# Patient Record
Sex: Male | Born: 1955 | Race: Black or African American | Hispanic: No | Marital: Married | Smoking: Never smoker
Health system: Southern US, Community
[De-identification: ages and names within clinical notes are randomized; demographics above are authoritative.]

## PROBLEM LIST (undated history)

## (undated) DIAGNOSIS — E7800 Pure hypercholesterolemia, unspecified: Secondary | ICD-10-CM

## (undated) DIAGNOSIS — I10 Essential (primary) hypertension: Secondary | ICD-10-CM

## (undated) HISTORY — DX: Pure hypercholesterolemia, unspecified: E78.00

---

## 2002-06-23 HISTORY — PX: KNEE SURGERY: SHX244

## 2011-06-11 ENCOUNTER — Emergency Department (HOSPITAL_COMMUNITY): Payer: Worker's Compensation

## 2011-06-11 ENCOUNTER — Encounter (HOSPITAL_COMMUNITY)
Admission: EM | Disposition: A | Discharge status: Home Health | Payer: Self-pay | Source: Home / Self Care | Attending: Orthopedic Surgery

## 2011-06-11 ENCOUNTER — Emergency Department (HOSPITAL_COMMUNITY): Payer: Worker's Compensation | Admitting: Anesthesiology

## 2011-06-11 ENCOUNTER — Encounter (HOSPITAL_COMMUNITY): Payer: Self-pay | Admitting: Anesthesiology

## 2011-06-11 ENCOUNTER — Other Ambulatory Visit: Payer: Self-pay

## 2011-06-11 ENCOUNTER — Inpatient Hospital Stay (HOSPITAL_COMMUNITY)
Admission: EM | Admit: 2011-06-11 | Discharge: 2011-06-13 | DRG: 489 | Disposition: A | Discharge status: Home Health | Payer: Worker's Compensation | Attending: Orthopedic Surgery | Admitting: Orthopedic Surgery

## 2011-06-11 ENCOUNTER — Encounter: Payer: Self-pay | Admitting: Emergency Medicine

## 2011-06-11 DIAGNOSIS — E119 Type 2 diabetes mellitus without complications: Secondary | ICD-10-CM | POA: Diagnosis present

## 2011-06-11 DIAGNOSIS — S83006A Unspecified dislocation of unspecified patella, initial encounter: Secondary | ICD-10-CM

## 2011-06-11 DIAGNOSIS — S838X9A Sprain of other specified parts of unspecified knee, initial encounter: Principal | ICD-10-CM | POA: Diagnosis present

## 2011-06-11 DIAGNOSIS — Y990 Civilian activity done for income or pay: Secondary | ICD-10-CM

## 2011-06-11 DIAGNOSIS — W010XXA Fall on same level from slipping, tripping and stumbling without subsequent striking against object, initial encounter: Secondary | ICD-10-CM | POA: Diagnosis present

## 2011-06-11 DIAGNOSIS — S86819A Strain of other muscle(s) and tendon(s) at lower leg level, unspecified leg, initial encounter: Secondary | ICD-10-CM

## 2011-06-11 HISTORY — PX: PATELLAR TENDON REPAIR: SHX737

## 2011-06-11 LAB — COMPREHENSIVE METABOLIC PANEL
ALT: 23 U/L (ref 0–53)
AST: 22 U/L (ref 0–37)
Albumin: 3.8 g/dL (ref 3.5–5.2)
Alkaline Phosphatase: 106 U/L (ref 39–117)
BUN: 13 mg/dL (ref 6–23)
Chloride: 102 mEq/L (ref 96–112)
Potassium: 4.3 mEq/L (ref 3.5–5.1)
Sodium: 136 mEq/L (ref 135–145)
Total Bilirubin: 0.3 mg/dL (ref 0.3–1.2)
Total Protein: 7.6 g/dL (ref 6.0–8.3)

## 2011-06-11 LAB — DIFFERENTIAL
Basophils Relative: 0 % (ref 0–1)
Lymphs Abs: 1 10*3/uL (ref 0.7–4.0)
Monocytes Relative: 6 % (ref 3–12)
Neutro Abs: 5.7 10*3/uL (ref 1.7–7.7)
Neutrophils Relative %: 79 % — ABNORMAL HIGH (ref 43–77)

## 2011-06-11 LAB — CBC
Hemoglobin: 12.7 g/dL — ABNORMAL LOW (ref 13.0–17.0)
MCHC: 33.5 g/dL (ref 30.0–36.0)
Platelets: 236 10*3/uL (ref 150–400)
RBC: 4.66 MIL/uL (ref 4.22–5.81)

## 2011-06-11 LAB — GLUCOSE, CAPILLARY
Glucose-Capillary: 171 mg/dL — ABNORMAL HIGH (ref 70–99)
Glucose-Capillary: 94 mg/dL (ref 70–99)

## 2011-06-11 LAB — PROTIME-INR: Prothrombin Time: 13.9 seconds (ref 11.6–15.2)

## 2011-06-11 SURGERY — REPAIR, TENDON, PATELLAR
Anesthesia: General | Site: Knee | Laterality: Right | Wound class: Clean

## 2011-06-11 MED ORDER — ONDANSETRON HCL 4 MG/2ML IJ SOLN: 4.0000 mg | Freq: Three times a day (TID) | INTRAMUSCULAR | Status: DC | PRN

## 2011-06-11 MED ORDER — CEFAZOLIN SODIUM 1-5 GM-% IV SOLN
INTRAVENOUS | Status: AC
  Filled 2011-06-11: qty 100

## 2011-06-11 MED ORDER — MORPHINE SULFATE 4 MG/ML IJ SOLN
INTRAMUSCULAR | Status: AC
  Filled 2011-06-11: qty 1

## 2011-06-11 MED ORDER — MORPHINE SULFATE 4 MG/ML IJ SOLN: 4.0000 mg | INTRAMUSCULAR | Status: DC | PRN

## 2011-06-11 MED ORDER — CEFAZOLIN SODIUM 1-5 GM-% IV SOLN
INTRAVENOUS | Status: DC | PRN
  Administered 2011-06-11: 2 g via INTRAVENOUS

## 2011-06-11 MED ORDER — LACTATED RINGERS IV SOLN: INTRAVENOUS | Status: DC | PRN

## 2011-06-11 MED ORDER — WARFARIN SODIUM 5 MG PO TABS
5.0000 mg | ORAL_TABLET | Freq: Once | ORAL | Status: AC
  Administered 2011-06-11: 5 mg via ORAL
  Filled 2011-06-11: qty 1

## 2011-06-11 MED ORDER — EPHEDRINE SULFATE 50 MG/ML IJ SOLN
INTRAMUSCULAR | Status: DC | PRN
  Administered 2011-06-11 (×2): 5 mg via INTRAVENOUS

## 2011-06-11 MED ORDER — ONDANSETRON HCL 4 MG/2ML IJ SOLN
INTRAMUSCULAR | Status: DC | PRN
  Administered 2011-06-11: 4 mg via INTRAVENOUS

## 2011-06-11 MED ORDER — MORPHINE SULFATE 4 MG/ML IJ SOLN
INTRAMUSCULAR | Status: DC | PRN
  Administered 2011-06-11: 8 mg via SUBCUTANEOUS

## 2011-06-11 MED ORDER — SODIUM CHLORIDE 0.45 % IV SOLN
INTRAVENOUS | Status: DC
  Administered 2011-06-12 (×2): via INTRAVENOUS

## 2011-06-11 MED ORDER — BUPIVACAINE HCL (PF) 0.5 % IJ SOLN
INTRAMUSCULAR | Status: AC
  Filled 2011-06-11: qty 30

## 2011-06-11 MED ORDER — ACETAMINOPHEN 10 MG/ML IV SOLN
INTRAVENOUS | Status: AC
  Filled 2011-06-11: qty 100

## 2011-06-11 MED ORDER — INFLUENZA VIRUS VACC SPLIT PF IM SUSP
0.5000 mL | INTRAMUSCULAR | Status: AC
  Administered 2011-06-12: 0.5 mL via INTRAMUSCULAR
  Filled 2011-06-11 (×2): qty 0.5

## 2011-06-11 MED ORDER — COUMADIN BOOK
Freq: Once | Status: AC
  Administered 2011-06-11: 23:00:00
  Filled 2011-06-11: qty 1

## 2011-06-11 MED ORDER — MIDAZOLAM HCL 5 MG/5ML IJ SOLN
INTRAMUSCULAR | Status: DC | PRN
  Administered 2011-06-11: 2 mg via INTRAVENOUS
  Administered 2011-06-11: 1 mg via INTRAVENOUS

## 2011-06-11 MED ORDER — METHOCARBAMOL 500 MG PO TABS
500.0000 mg | ORAL_TABLET | Freq: Four times a day (QID) | ORAL | Status: DC | PRN
  Administered 2011-06-12 – 2011-06-13 (×2): 500 mg via ORAL
  Filled 2011-06-11 (×2): qty 1

## 2011-06-11 MED ORDER — CEFAZOLIN SODIUM 1-5 GM-% IV SOLN
1.0000 g | Freq: Three times a day (TID) | INTRAVENOUS | Status: AC
  Administered 2011-06-12 (×3): 1 g via INTRAVENOUS
  Filled 2011-06-11 (×3): qty 50

## 2011-06-11 MED ORDER — DEXTROSE-NACL 5-0.45 % IV SOLN
INTRAVENOUS | Status: AC
  Administered 2011-06-11: 12:00:00 via INTRAVENOUS

## 2011-06-11 MED ORDER — LIDOCAINE HCL (CARDIAC) 20 MG/ML IV SOLN
INTRAVENOUS | Status: DC | PRN
  Administered 2011-06-11: 100 mg via INTRAVENOUS

## 2011-06-11 MED ORDER — HYDROMORPHONE HCL PF 1 MG/ML IJ SOLN: 0.2500 mg | INTRAMUSCULAR | Status: DC | PRN

## 2011-06-11 MED ORDER — ACETAMINOPHEN 10 MG/ML IV SOLN
INTRAVENOUS | Status: DC | PRN
  Administered 2011-06-11: 1000 mg via INTRAVENOUS

## 2011-06-11 MED ORDER — PROPOFOL 10 MG/ML IV EMUL
INTRAVENOUS | Status: DC | PRN
  Administered 2011-06-11: 200 mg via INTRAVENOUS

## 2011-06-11 MED ORDER — ONDANSETRON HCL 4 MG PO TABS: 4.0000 mg | ORAL_TABLET | Freq: Four times a day (QID) | ORAL | Status: DC | PRN

## 2011-06-11 MED ORDER — HYDROMORPHONE HCL PF 1 MG/ML IJ SOLN
0.5000 mg | INTRAMUSCULAR | Status: DC | PRN
  Administered 2011-06-11 – 2011-06-12 (×6): 1 mg via INTRAVENOUS
  Filled 2011-06-11 (×6): qty 1

## 2011-06-11 MED ORDER — WARFARIN VIDEO
Freq: Once | Status: AC
  Administered 2011-06-12: 12:00:00

## 2011-06-11 MED ORDER — METOCLOPRAMIDE HCL 10 MG PO TABS: 5.0000 mg | ORAL_TABLET | Freq: Three times a day (TID) | ORAL | Status: DC | PRN

## 2011-06-11 MED ORDER — OXYCODONE HCL 5 MG PO TABS
5.0000 mg | ORAL_TABLET | ORAL | Status: DC | PRN
  Administered 2011-06-12 – 2011-06-13 (×2): 10 mg via ORAL
  Filled 2011-06-11 (×2): qty 2

## 2011-06-11 MED ORDER — BUPIVACAINE HCL (PF) 0.5 % IJ SOLN
INTRAMUSCULAR | Status: DC | PRN
  Administered 2011-06-11: 15 mL

## 2011-06-11 MED ORDER — FENTANYL CITRATE 0.05 MG/ML IJ SOLN
INTRAMUSCULAR | Status: DC | PRN
  Administered 2011-06-11: 50 ug via INTRAVENOUS
  Administered 2011-06-11: 100 ug via INTRAVENOUS
  Administered 2011-06-11 (×4): 50 ug via INTRAVENOUS

## 2011-06-11 MED ORDER — METHOCARBAMOL 100 MG/ML IJ SOLN
500.0000 mg | Freq: Four times a day (QID) | INTRAVENOUS | Status: DC | PRN
  Filled 2011-06-11: qty 5

## 2011-06-11 MED ORDER — PROMETHAZINE HCL 25 MG/ML IJ SOLN: 6.2500 mg | INTRAMUSCULAR | Status: DC | PRN

## 2011-06-11 MED ORDER — AMLODIPINE BESYLATE 10 MG PO TABS
10.0000 mg | ORAL_TABLET | Freq: Once | ORAL | Status: AC
  Administered 2011-06-11: 10 mg via ORAL
  Filled 2011-06-11: qty 1

## 2011-06-11 MED ORDER — ONDANSETRON HCL 4 MG/2ML IJ SOLN: 4.0000 mg | Freq: Four times a day (QID) | INTRAMUSCULAR | Status: DC | PRN

## 2011-06-11 MED ORDER — POTASSIUM CHLORIDE IN NACL 20-0.9 MEQ/L-% IV SOLN
INTRAVENOUS | Status: AC
  Administered 2011-06-11: 23:00:00 via INTRAVENOUS
  Filled 2011-06-11: qty 1000

## 2011-06-11 MED ORDER — METOCLOPRAMIDE HCL 5 MG/ML IJ SOLN
5.0000 mg | Freq: Three times a day (TID) | INTRAMUSCULAR | Status: DC | PRN
  Filled 2011-06-11: qty 2

## 2011-06-11 MED ORDER — LACTATED RINGERS IV SOLN
INTRAVENOUS | Status: DC
  Administered 2011-06-11: 19:00:00 via INTRAVENOUS
  Administered 2011-06-11: 1000 mL via INTRAVENOUS

## 2011-06-11 SURGICAL SUPPLY — 51 items
BAG ZIPLOCK 12X15 (MISCELLANEOUS) ×2 IMPLANT
BANDAGE ELASTIC 6 VELCRO ST LF (GAUZE/BANDAGES/DRESSINGS) ×6 IMPLANT
BANDAGE ESMARK 6X9 LF (GAUZE/BANDAGES/DRESSINGS) ×1 IMPLANT
BANDAGE GAUZE ELAST BULKY 4 IN (GAUZE/BANDAGES/DRESSINGS) IMPLANT
BNDG COHESIVE 4X5 TAN STRL (GAUZE/BANDAGES/DRESSINGS) ×2 IMPLANT
BNDG COHESIVE 6X5 TAN STRL LF (GAUZE/BANDAGES/DRESSINGS) ×2 IMPLANT
BNDG ESMARK 6X9 LF (GAUZE/BANDAGES/DRESSINGS) ×2
CLOTH BEACON ORANGE TIMEOUT ST (SAFETY) ×2 IMPLANT
COVER MAYO STAND STRL (DRAPES) ×2 IMPLANT
CUFF TOURN SGL QUICK 34 (TOURNIQUET CUFF) ×1
CUFF TRNQT CYL 34X4X40X1 (TOURNIQUET CUFF) ×1 IMPLANT
DECANTER SPIKE VIAL GLASS SM (MISCELLANEOUS) ×2 IMPLANT
DRAPE U-SHAPE 47X51 STRL (DRAPES) ×2 IMPLANT
DRESSING XEROFORM 5X9 (GAUZE/BANDAGES/DRESSINGS) ×2 IMPLANT
DRSG ADAPTIC 3X8 NADH LF (GAUZE/BANDAGES/DRESSINGS) ×2 IMPLANT
DRSG EMULSION OIL 3X16 NADH (GAUZE/BANDAGES/DRESSINGS) ×2 IMPLANT
DRSG PAD ABDOMINAL 8X10 ST (GAUZE/BANDAGES/DRESSINGS) ×2 IMPLANT
DURAPREP 26ML APPLICATOR (WOUND CARE) ×2 IMPLANT
ELECT REM PT RETURN 9FT ADLT (ELECTROSURGICAL) ×2
ELECTRODE REM PT RTRN 9FT ADLT (ELECTROSURGICAL) ×1 IMPLANT
GAUZE XEROFORM 5X9 LF (GAUZE/BANDAGES/DRESSINGS) ×2 IMPLANT
GLOVE SURG ORTHO 8.0 STRL STRW (GLOVE) ×2 IMPLANT
GOWN PREVENTION PLUS XXLARGE (GOWN DISPOSABLE) ×2 IMPLANT
GOWN STRL NON-REIN LRG LVL3 (GOWN DISPOSABLE) ×2 IMPLANT
IMMOBILIZER KNEE 20 (SOFTGOODS)
IMMOBILIZER KNEE 20 THIGH 36 (SOFTGOODS) IMPLANT
KIT BASIN OR (CUSTOM PROCEDURE TRAY) ×2 IMPLANT
MANIFOLD NEPTUNE II (INSTRUMENTS) ×2 IMPLANT
NEEDLE MAYO .5 CIRCLE (NEEDLE) ×2 IMPLANT
NS IRRIG 1000ML POUR BTL (IV SOLUTION) ×2 IMPLANT
PACK LOWER EXTREMITY WL (CUSTOM PROCEDURE TRAY) ×2 IMPLANT
PADDING CAST COTTON 6X4 STRL (CAST SUPPLIES) ×2 IMPLANT
PADDING WEBRIL 6 STERILE (GAUZE/BANDAGES/DRESSINGS) ×6 IMPLANT
PASSER SUT SWANSON 36MM LOOP (INSTRUMENTS) ×2 IMPLANT
POSITIONER SURGICAL ARM (MISCELLANEOUS) ×2 IMPLANT
SPONGE GAUZE 4X4 12PLY (GAUZE/BANDAGES/DRESSINGS) ×2 IMPLANT
SPONGE LAP 18X18 X RAY DECT (DISPOSABLE) ×4 IMPLANT
SPONGE LAP 4X18 X RAY DECT (DISPOSABLE) ×4 IMPLANT
STAPLER VISISTAT 35W (STAPLE) ×2 IMPLANT
SUT ETHIBOND NAB CT1 #1 30IN (SUTURE) ×4 IMPLANT
SUT FIBERWIRE #2 38 T-5 BLUE (SUTURE) ×2
SUT PROLENE 3 0 PS 2 (SUTURE) ×4 IMPLANT
SUT VIC AB 0 CT1 27 (SUTURE) ×1
SUT VIC AB 0 CT1 27XBRD ANTBC (SUTURE) ×1 IMPLANT
SUT VIC AB 1 CT1 27 (SUTURE) ×2
SUT VIC AB 1 CT1 27XBRD ANTBC (SUTURE) ×2 IMPLANT
SUT VIC AB 2-0 CT1 27 (SUTURE) ×1
SUT VIC AB 2-0 CT1 TAPERPNT 27 (SUTURE) ×1 IMPLANT
SUTURE FIBERWR #2 38 T-5 BLUE (SUTURE) ×1 IMPLANT
TOWEL OR 17X26 10 PK STRL BLUE (TOWEL DISPOSABLE) ×4 IMPLANT
WATER STERILE IRR 1500ML POUR (IV SOLUTION) ×2 IMPLANT

## 2011-06-11 NOTE — Consult Note (Signed)
Reason for Consult:right knee pain Referring Physician: Alto Denver MD  William Mcguire is an 55 y.o. male.  HPI: William Mcguire is a 55 year old patient who was at work today. He had a pole comports him which he had to dodge. He sustained a twisting injury to his right knee and has not been able to weight-bear on the right knee since that time. He does report having previous knee arthroscopy on the right knee in the past but has had no recent injury or problems with the right knee until this injury today which occurred at work. He is brought to the emergency room where evaluation has been performed. He denies any other orthopedic complaints.  Past Medical History  Diagnosis Date  . Diabetes mellitus     Past Surgical History  Procedure Date  . Knee surgery     torn cartilage    History reviewed. No pertinent family history.  Social History:  reports that he has never smoked. He has never used smokeless tobacco. He reports that he does not drink alcohol or use illicit drugs.  Allergies: No Known Allergies  Medications: I have reviewed the patient's current medications.  Results for orders placed during the hospital encounter of 06/11/11 (from the past 48 hour(s))  CBC     Status: Abnormal   Collection Time   06/11/11 11:31 AM      Component Value Range Comment   WBC 7.2  4.0 - 10.5 (K/uL)    RBC 4.66  4.22 - 5.81 (MIL/uL)    Hemoglobin 12.7 (*) 13.0 - 17.0 (g/dL)    HCT 16.1 (*) 09.6 - 52.0 (%)    MCV 81.3  78.0 - 100.0 (fL)    MCH 27.3  26.0 - 34.0 (pg)    MCHC 33.5  30.0 - 36.0 (g/dL)    RDW 04.5  40.9 - 81.1 (%)    Platelets 236  150 - 400 (K/uL)   DIFFERENTIAL     Status: Abnormal   Collection Time   06/11/11 11:31 AM      Component Value Range Comment   Neutrophils Relative 79 (*) 43 - 77 (%)    Neutro Abs 5.7  1.7 - 7.7 (K/uL)    Lymphocytes Relative 14  12 - 46 (%)    Lymphs Abs 1.0  0.7 - 4.0 (K/uL)    Monocytes Relative 6  3 - 12 (%)    Monocytes Absolute 0.4  0.1 - 1.0  (K/uL)    Eosinophils Relative 1  0 - 5 (%)    Eosinophils Absolute 0.0  0.0 - 0.7 (K/uL)    Basophils Relative 0  0 - 1 (%)    Basophils Absolute 0.0  0.0 - 0.1 (K/uL)   COMPREHENSIVE METABOLIC PANEL     Status: Abnormal   Collection Time   06/11/11 11:31 AM      Component Value Range Comment   Sodium 136  135 - 145 (mEq/L)    Potassium 4.3  3.5 - 5.1 (mEq/L)    Chloride 102  96 - 112 (mEq/L)    CO2 28  19 - 32 (mEq/L)    Glucose, Bld 196 (*) 70 - 99 (mg/dL)    BUN 13  6 - 23 (mg/dL)    Creatinine, Ser 9.14  0.50 - 1.35 (mg/dL)    Calcium 9.9  8.4 - 10.5 (mg/dL)    Total Protein 7.6  6.0 - 8.3 (g/dL)    Albumin 3.8  3.5 - 5.2 (g/dL)    AST  22  0 - 37 (U/L)    ALT 23  0 - 53 (U/L)    Alkaline Phosphatase 106  39 - 117 (U/L)    Total Bilirubin 0.3  0.3 - 1.2 (mg/dL)    GFR calc non Af Amer 71 (*) >90 (mL/min)    GFR calc Af Amer 82 (*) >90 (mL/min)   PROTIME-INR     Status: Normal   Collection Time   06/11/11 11:31 AM      Component Value Range Comment   Prothrombin Time 13.9  11.6 - 15.2 (seconds)    INR 1.05  0.00 - 1.49    APTT     Status: Normal   Collection Time   06/11/11 11:31 AM      Component Value Range Comment   aPTT 30  24 - 37 (seconds)   GLUCOSE, CAPILLARY     Status: Abnormal   Collection Time   06/11/11 11:38 AM      Component Value Range Comment   Glucose-Capillary 171 (*) 70 - 99 (mg/dL)     Dg Chest 2 View  19/14/7829  *RADIOLOGY REPORT*  Clinical Data: Preoperative respiratory films.  Patient for knee surgery.  CHEST - 2 VIEW  Comparison: None.  Findings: Lungs are clear.  Heart size is normal.  No pneumothorax or pleural effusion.  IMPRESSION: Negative chest.  Original Report Authenticated By: William Mcguire. William Mcguire, M.D.   Dg Knee Complete 4 Views Right  06/11/2011  *RADIOLOGY REPORT*  Clinical Data: Twisting injury.  Pain.  RIGHT KNEE - COMPLETE 4+ VIEW  Comparison: None.  Findings: There is patella alta with a complete tear of the patellar tendon  off the inferior pole of the patella.  No fracture is identified.  Tricompartmental osteoarthritis is noted.  IMPRESSION:  1.  Complete tear of the patellar tendon off the inferior pole the patella. 2.  Negative for fracture. 3.  Osteoarthritis.  Original Report Authenticated By: William Mcguire. William Mcguire, M.D.    Review of Systems  Constitutional: Negative.   HENT: Negative.   Eyes: Negative.   Respiratory: Negative.   Cardiovascular: Negative.   Gastrointestinal: Negative.   Genitourinary: Negative.   Musculoskeletal: Positive for joint pain.  Skin: Negative.   Neurological: Negative.   Endo/Heme/Allergies: Negative.    Blood pressure 142/82, pulse 71, temperature 98 F (36.7 C), temperature source Oral, resp. rate 14, SpO2 100.00%. Physical Exam  Constitutional: He is oriented to person, place, and time. He appears well-developed and well-nourished.  HENT:  Head: Normocephalic and atraumatic.  Eyes: EOM are normal. Pupils are equal, round, and reactive to light.  Neck: Normal range of motion. Neck supple.  Cardiovascular: Normal rate, regular rhythm and normal heart sounds.   Respiratory: Effort normal and breath sounds normal.  GI: Soft.  Neurological: He is alert and oriented to person, place, and time.  Skin: Skin is warm and dry.  Psychiatric: He has a normal mood and affect.   right knee is examined. The patient has palpable defect at the inferior pole of the patella. No active extension is present. Collaterals are stable. Anterior cruciate ligament is intact. Pedal pulses are palpable on the right. There is no groin pain with internal or external rotation of the right leg.  Assessment/Plan: Impression is right patellar tendon rupture in an active 55 year old male who has diabetes but no other medical problems. He works on the [employer] for [employer] which involves climbing. Radiographs are consistent with patellar tendon disruption off the inferior pole.  This is also cooperated on  exam. I discussed with parent and his wife via telephone call operative and nonoperative options for this injury. For his current activity level operative fixation will give him the best result which will allow him return to work after 2-3 months. Plan at this time is for surgical fixation of the patellar tendon disruption the risk and benefits are discussed with patient including but not limited to infection stiffness incomplete healing. All questions are answered. Medical decision making today complicated by decision for surgery. Makiya Jeune SCOTT 06/11/2011, 12:40 PM

## 2011-06-11 NOTE — ED Notes (Signed)
Pt states he had cartilage surgery on both knees 5 years ago

## 2011-06-11 NOTE — Brief Op Note (Signed)
06/11/2011  7:47 PM  PATIENT:  William Mcguire  55 y.o. male  PRE-OPERATIVE DIAGNOSIS:  rupture patella tendon, right  POST-OPERATIVE DIAGNOSIS:  rupture patella tendon, right  PROCEDURE:  Procedure(s): PATELLA TENDON REPAIR  SURGEON:  Surgeon(s): Cammy Copa  ASSISTANT: none  ANESTHESIA:   general  EBL: 50 ml       BLOOD ADMINISTERED: none  DRAINS: none   LOCAL MEDICATIONS USED:  none  SPECIMEN:  No Specimen  COUNTS:  YES  TOURNIQUET:  * No tourniquets in log *  DICTATION: .Other Dictation: Dictation Number [id number]  PLAN OF CARE: Admit to inpatient   PATIENT DISPOSITION:  PACU - hemodynamically stable

## 2011-06-11 NOTE — Progress Notes (Signed)
ANTICOAGULATION CONSULT NOTE - Initial Consult  Pharmacy Consult for warfarin Indication: s/p patella tendon rupture repair  No Known Allergies  Patient Measurements:   Vital Signs: Temp: 97.9 F (36.6 C) (12/19 2051) Temp src: Oral (12/19 1626) BP: 151/82 mmHg (12/19 2051) Pulse Rate: 72  (12/19 2051)  Labs:  Basename 06/11/11 1131  HGB 12.7*  HCT 37.9*  PLT 236  APTT 30  LABPROT 13.9  INR 1.05  HEPARINUNFRC --  CREATININE 1.12  CKTOTAL --  CKMB --  TROPONINI --   CrCl is unknown because there is no height on file for the current visit.  Medical History: Past Medical History  Diagnosis Date  . Diabetes mellitus     Medications:  Scheduled:    . amLODipine  10 mg Oral Once  . ceFAZolin (ANCEF) IV  1 g Intravenous Q8H  . dextrose 5 % and 0.45% NaCl   Intravenous STAT  . influenza  inactive virus vaccine  0.5 mL Intramuscular Tomorrow-1000    Assessment: 55 yo male admitted s/p patella tendon rupture repair to start warfarin post-op for DVT prophylaxis  Goal of Therapy:  INR 2-3   Plan:  1. Warfarin 5mg  tonight 2. Daily PT/INR 3. Warfarin education booklet/video   Hessie Knows, PharmD, BCPS 06/11/2011 9:24 PM [phone]

## 2011-06-11 NOTE — Transfer of Care (Signed)
Immediate Anesthesia Transfer of Care Note  Patient: William Mcguire  Procedure(s) Performed:  PATELLA TENDON REPAIR  Patient Location: PACU  Anesthesia Type: General  Level of Consciousness: awake, oriented and patient cooperative  Airway & Oxygen Therapy: Patient Spontanous Breathing and Patient connected to face mask oxygen  Post-op Assessment: Report given to PACU RN and Post -op Vital signs reviewed and stable  Post vital signs: Reviewed and stable  Complications: No apparent anesthesia complications

## 2011-06-11 NOTE — Anesthesia Preprocedure Evaluation (Signed)
Anesthesia Evaluation  Patient identified by MRN, date of birth, ID band Patient awake    Reviewed: Allergy & Precautions, H&P , NPO status , Patient's Chart, lab work & pertinent test results  Airway Mallampati: II TM Distance: >3 FB Neck ROM: Full    Dental No notable dental hx.    Pulmonary neg pulmonary ROS,  clear to auscultation  Pulmonary exam normal       Cardiovascular hypertension, Pt. on medications neg cardio ROS Regular Normal    Neuro/Psych Negative Neurological ROS  Negative Psych ROS   GI/Hepatic negative GI ROS, Neg liver ROS,   Endo/Other  Negative Endocrine ROSDiabetes mellitus-, Type 2, Oral Hypoglycemic Agents  Renal/GU negative Renal ROS  Genitourinary negative   Musculoskeletal negative musculoskeletal ROS (+)   Abdominal   Peds negative pediatric ROS (+)  Hematology negative hematology ROS (+)   Anesthesia Other Findings   Reproductive/Obstetrics negative OB ROS                           Anesthesia Physical Anesthesia Plan  ASA: II  Anesthesia Plan: General   Post-op Pain Management:    Induction: Intravenous  Airway Management Planned: LMA  Additional Equipment:   Intra-op Plan:   Post-operative Plan: Extubation in OR  Informed Consent: I have reviewed the patients History and Physical, chart, labs and discussed the procedure including the risks, benefits and alternatives for the proposed anesthesia with the patient or authorized representative who has indicated his/her understanding and acceptance.   Dental advisory given  Plan Discussed with: CRNA  Anesthesia Plan Comments:         Anesthesia Quick Evaluation

## 2011-06-11 NOTE — Anesthesia Postprocedure Evaluation (Signed)
  Anesthesia Post-op Note  Patient: William Mcguire  Procedure(s) Performed:  PATELLA TENDON REPAIR  Patient Location: PACU  Anesthesia Type: General  Level of Consciousness: awake and alert   Airway and Oxygen Therapy: Patient Spontanous Breathing  Post-op Pain: mild  Post-op Assessment: Post-op Vital signs reviewed, Patient's Cardiovascular Status Stable, Respiratory Function Stable, Patent Airway and No signs of Nausea or vomiting  Post-op Vital Signs: stable  Complications: No apparent anesthesia complications

## 2011-06-11 NOTE — ED Notes (Signed)
Patient transported to X-ray 

## 2011-06-11 NOTE — ED Notes (Signed)
Per ems and pt:  Pt dislocated R knee.  Was dodging falling telephone pole when pt slipped sideways and dislocated knee.  Pt states pole glanced off helmet, says only helmet moved, not head.  Denies neck/back pain.  Knee obviously dislocated.  Pulses and sensation present.  Fentanyl given by EMS.  Pain from 10 to 2 currently.  PMH of diabetes.  Last eaten last night.

## 2011-06-11 NOTE — ED Notes (Signed)
CBG 171 Nurse aware

## 2011-06-11 NOTE — ED Notes (Signed)
MD at bedside. Dr Deans in to evaluate pt, pt will go to surgery later today

## 2011-06-11 NOTE — Progress Notes (Signed)
Ed Admission Rn CM consult for possible home health services CM spoke pt who confirms no prior use or preference for home health care agency.  CM discussed providing him opportunity to choose agency Provided with list of [place] agencies Pending pt decision For surgery

## 2011-06-11 NOTE — ED Provider Notes (Addendum)
History     CSN: [id number] Arrival date & time: 06/11/2011  9:25 AM   First MD Initiated Contact with Patient 06/11/11 0932      Chief Complaint  Patient presents with  . Dislocation  . 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 Patient was loading an electrical pole onto a truck when he slipped and fell. Patient jumped out of the way and has not born weight on his right knee since. Patient has no pain when he is still and holds the knee in partial flexion. There is obvious deformity. Patient is neurovascularly intact distal to the injury. He is unable to extend his leg.  Patient is 5/10 with movement in his leg. Prior to arrival he received 250 mcg of fentanyl IV.   Past Medical History  Diagnosis Date  . Diabetes mellitus     Past Surgical History  Procedure Date  . Knee surgery     torn cartilage    History reviewed. No pertinent family history.  History  Substance Use Topics  . Smoking status: Never Smoker   . Smokeless tobacco: Not on file  . Alcohol Use: No      Review of Systems  Constitutional: Negative.   HENT: Negative.   Eyes: Negative.   Respiratory: Negative.   Cardiovascular: Negative.   Gastrointestinal: Negative.   Genitourinary: Negative.   Musculoskeletal:       Right knee pain  Skin: Negative.   Neurological: Negative.   Hematological: Negative.   Psychiatric/Behavioral: Negative.   All other systems reviewed and are negative.    Allergies  Review of patient's allergies indicates no known allergies.  Home Medications   Current Outpatient Rx  Name Route Sig Dispense Refill  . AMLODIPINE-ATORVASTATIN 10-20 MG PO TABS Oral Take 1 tablet by mouth daily.      [name] VICTOZA [place] Subcutaneous Inject 1.8 mg into the skin at bedtime.      [name] METFORMIN HCL 1000 MG PO  TABS Oral Take 1,000 mg by mouth 2 (two) times daily with a meal.      . PIOGLITAZONE HCL 15 MG PO TABS Oral Take 15 mg by mouth daily.        BP 143/77  Pulse 68  Temp 98 F (36.7 C)  SpO2 97%  Physical Exam  Nursing note and vitals reviewed. Constitutional: He is oriented to person, place, and time. He appears well-developed and well-nourished. No distress.  HENT:  Head: Normocephalic and atraumatic.  Eyes: Conjunctivae and EOM are normal. Pupils are equal, round, and reactive to light.  Neck: Normal range of motion.  Cardiovascular: Normal rate, regular rhythm, normal heart sounds and intact distal pulses.  Exam reveals no gallop and no friction rub.   No murmur heard. Pulmonary/Chest: Effort normal and breath sounds normal. No respiratory distress. He has no wheezes. He has no rales.  Abdominal: Soft. Bowel sounds are normal. He exhibits no distension. There is no tenderness. There is no rebound and no guarding.  Musculoskeletal:       Patient with what appears to be a right patellar dislocation. As high riding and medially displaced patella. Patient is unable to extend his leg. DP and PT pulses are 2+ distal to injury.  Neurological: He is alert and oriented to person, place, and time. No cranial nerve deficit. He exhibits normal muscle tone. Coordination normal.  Skin: Skin is warm and dry. No rash noted.  Psychiatric: He has a normal mood and affect.    ED Course  Procedures (including critical care time)   Date: 06/11/2011  Rate: 70  Rhythm: normal sinus rhythm  QRS Axis: normal  Intervals: normal  ST/T Wave abnormalities: nonspecific T wave changes  Conduction Disutrbances:none  Narrative Interpretation: 1 T wave inversion noted in lead 3. No acute ischemic changes.  Old EKG Reviewed: none available   Labs Reviewed  CBC - Abnormal; Notable for the following:    Hemoglobin 12.7 (*)    HCT 37.9 (*)    All other components within normal limits  DIFFERENTIAL -  Abnormal; Notable for the following:    Neutrophils Relative 79 (*)    All other components within normal limits  COMPREHENSIVE METABOLIC PANEL - Abnormal; Notable for the following:    Glucose, Bld 196 (*)    GFR calc non Af Amer 71 (*)    GFR calc Af Amer 82 (*)    All other components within normal limits  GLUCOSE, CAPILLARY - Abnormal; Notable for the following:    Glucose-Capillary 171 (*)    All other components within normal limits  PROTIME-INR  APTT  POCT CBG MONITORING   Dg Knee Complete 4 Views Right  06/11/2011  *RADIOLOGY REPORT*  Clinical Data: Twisting injury.  Pain.  RIGHT KNEE - COMPLETE 4+ VIEW  Comparison: None.  Findings: There is patella alta with a complete tear of the patellar tendon off the inferior pole of the patella.  No fracture is identified.  Tricompartmental osteoarthritis is noted.  IMPRESSION:  1.  Complete tear of the patellar tendon off the inferior pole the patella. 2.  Negative for fracture. 3.  Osteoarthritis.  Original Report Authenticated By: Bernadene Bell. D'ALESSIO, M.D.     1. Closed patellar dislocation   2. Patellar tendon rupture       MDM  Patient was evaluated by myself. He did not require pain medication upon presentation. Patient had plain film performed. This is concerning for complete tear of the patellar tendon off the inferior border of the patella. Patient was unable to extend the right knee. Orthopedics was consulted.  I spoke with Dr. August Saucer who requested that the patient be admitted as he will need to be managed operatively. Patient had holding orders placed. Bed requests is placed as well. Patient was kept n.p.o. except for a dose of his home blood pressure medication. IV fluids were started. EKG chest x-ray and screening labs were ordered per orthopedics. Patient will be admitted for operative management.  Labs were on unremarkable as was chest x-ray.        Cyndra Numbers, MD 06/11/11 1126  Cyndra Numbers, MD 06/11/11  [id number]  Cyndra Numbers, MD 06/11/11 1310

## 2011-06-11 NOTE — ED Notes (Signed)
ZOX:[id number]<AV> Expected date:06/11/11<BR> Expected time: 8:55 AM<BR> Means of arrival:Ambulance<BR> Comments:<BR>

## 2011-06-12 LAB — PROTIME-INR
INR: 1.06 (ref 0.00–1.49)
Prothrombin Time: 14 seconds (ref 11.6–15.2)

## 2011-06-12 MED ORDER — METFORMIN HCL 500 MG PO TABS
1000.0000 mg | ORAL_TABLET | Freq: Two times a day (BID) | ORAL | Status: DC
  Administered 2011-06-12 – 2011-06-13 (×2): 1000 mg via ORAL
  Filled 2011-06-12 (×5): qty 2

## 2011-06-12 MED ORDER — PIOGLITAZONE HCL 15 MG PO TABS
15.0000 mg | ORAL_TABLET | Freq: Every day | ORAL | Status: DC
  Administered 2011-06-12 – 2011-06-13 (×2): 15 mg via ORAL
  Filled 2011-06-12 (×3): qty 1

## 2011-06-12 MED ORDER — AMLODIPINE BESYLATE 10 MG PO TABS
10.0000 mg | ORAL_TABLET | Freq: Every day | ORAL | Status: DC
  Administered 2011-06-12 – 2011-06-13 (×2): 10 mg via ORAL
  Filled 2011-06-12 (×3): qty 1

## 2011-06-12 MED ORDER — WARFARIN SODIUM 10 MG PO TABS
10.0000 mg | ORAL_TABLET | Freq: Once | ORAL | Status: AC
  Administered 2011-06-12: 10 mg via ORAL
  Filled 2011-06-12: qty 1

## 2011-06-12 MED ORDER — ATORVASTATIN CALCIUM 10 MG PO TABS
20.0000 mg | ORAL_TABLET | Freq: Every day | ORAL | Status: DC
  Administered 2011-06-12 – 2011-06-13 (×2): 20 mg via ORAL
  Filled 2011-06-12 (×4): qty 2

## 2011-06-12 MED ORDER — AMLODIPINE-ATORVASTATIN 10-20 MG PO TABS: 1.0000 | ORAL_TABLET | Freq: Every day | ORAL | Status: DC

## 2011-06-12 NOTE — Progress Notes (Signed)
ANTICOAGULATION CONSULT NOTE - Follow Up Consult  Pharmacy Consult for Coumadin Indication: VTE prophylaxis  No Known Allergies  Patient Measurements: Height: 6\' 3"  (190.5 cm) Weight: 259 lb (117.482 kg) IBW/kg (Calculated) : 84.5   Vital Signs: Temp: 98.6 F (37 C) (12/20 0633) Temp src: Oral (12/20 0633) BP: 138/75 mmHg (12/20 0633) Pulse Rate: 72  (12/20 0633)  Labs:  Basename 06/12/11 0332 06/11/11 1131  HGB -- 12.7*  HCT -- 37.9*  PLT -- 236  APTT -- 30  LABPROT 14.0 13.9  INR 1.06 1.05  HEPARINUNFRC -- --  CREATININE -- 1.12  CKTOTAL -- --  CKMB -- --  TROPONINI -- --   Estimated Creatinine Clearance: 100.6 ml/min (by C-G formula based on Cr of 1.12).   Medications:  Scheduled:    . amLODipine  10 mg Oral Once  . ceFAZolin (ANCEF) IV  1 g Intravenous Q8H  . coumadin book   Does not apply Once  . dextrose 5 % and 0.45% NaCl   Intravenous STAT  . influenza  inactive virus vaccine  0.5 mL Intramuscular Tomorrow-1000  . warfarin  5 mg Oral Once  . warfarin   Does not apply Once   PRN: HYDROmorphone, methocarbamol(ROBAXIN) IV, methocarbamol, metoCLOPramide (REGLAN) injection, metoCLOPramide, morphine injection, ondansetron (ZOFRAN) IV, ondansetron, oxyCODONE, DISCONTD: bupivacaine, DISCONTD: HYDROmorphone, DISCONTD: morphine, DISCONTD: ondansetron (ZOFRAN) IV, DISCONTD: promethazine  Assessment: 55 yo M, s/p patella tendon rupture repair. Coumadin per pharmacy for DVT ppx. Coumadin 5mg  yesterday. INR unchanged, expected since just starting anticoagulation. No bleeding reported.  Goal of Therapy:  INR 2-3   Plan:  Coumadin 10mg  tonight based on [facility] dosing nomogram. F/U am INR. Education prior to d/c.  Gwen Her PharmD  [phone] 06/12/2011 9:26 AM

## 2011-06-12 NOTE — Op Note (Signed)
NAME:  William Mcguire, William Mcguire NO.:  [id number]  MEDICAL RECORD NO.:  [id number]  LOCATION:  1344                         FACILITY:  [facility]  PHYSICIAN:  Burnard Bunting, M.D.    DATE OF BIRTH:  [birth year]  DATE OF PROCEDURE:  06/11/2011 DATE OF DISCHARGE:                              OPERATIVE REPORT   PREOPERATIVE DIAGNOSIS:  Right knee patellar tendon rupture.  POSTOPERATIVE DIAGNOSIS:  Right knee patellar tendon rupture.  PROCEDURE:  Right knee patellar tendon rupture repair.  SURGEON:  Burnard Bunting, MD  ASSISTANT:  None.  ANESTHESIA:  General endotracheal.  ESTIMATED BLOOD LOSS:  25 cc.  DRAINS:  None.  INDICATIONS:  [name] is a 55 year old patient with left knee patellar tendon rupture sustained today at work, presents for operative management of complete patellar tendon rupture from the inferior pole of patella after explanation of risks and  benefits.  PROCEDURE IN DETAIL:  The patient brought to operating room, where general endotracheal anesthesia was induced, preoperative antibiotics administered.  Time-out was called.  Right knee was pre-scrubbed with alcohol and Betadine which allowed to air dry.  Prepped with DuraPrep solution and draped in sterile manner.  Anterior approach to knee was used, skin and subcutaneous tissue were sharply divided.  Paratenon layer was divided and then maintained for later re-closure over the patellar tendon.  The knee joint was thoroughly irrigated.  The retinaculum on either side of the patellar tendon tear was closed using #1 Vicryl suture.  Two FiberWire sutures were placed in modified Kessler type grasping fashion, beginning at the distal two-thirds of the patellar tendon, extending proximally.  Four strands were in the tendon and they were then placed through 3 longitudinal drill holes placed through a 2 mm drill bit in the patella.  The knee was extended and the tendon was reapproximated to the inferior  pole of the patella.  The paratenon was then closed over the repair.  The retinaculum was then closed using a 2-0 Vicryl suture.  Skin was closed using inverted 2-0 Vicryl suture and running 3-0 pullout Prolene.  Solution of Marcaine and morphine was injected through the knee.  Bulky splint knee immobilizer was placed.  The patient tolerated procedure well without immediate complications.  The plan is for weightbearing as tolerated in the knee immobilizers.     Burnard Bunting, M.D.     GSD/MEDQ  D:  06/11/2011  T:  06/12/2011  Job:  [id number]

## 2011-06-12 NOTE — Progress Notes (Signed)
06/12/11, Kathi Der RNC-MNN, BSN, [phone], CM received referral.   CM met with pt and his spouse.  Spouse will be able to help pt at home. Awaiting evaluation by PT for recommendations.  Medical Case Manager for this pt. is Shirley [place], [address] [facility].  Will follow.

## 2011-06-12 NOTE — Progress Notes (Signed)
Report called to Waverly Ferrari, RN, 6th Floor, @ [phone] prior to pt transfer per MD order.

## 2011-06-12 NOTE — Progress Notes (Signed)
Subjective: My knee hurts   Objective: Vital signs in last 24 hours: Temp:  [97.2 F (36.2 C)-98.8 F (37.1 C)] 98.8 F (37.1 C) (12/20 1357) Pulse Rate:  [72-84] 84  (12/20 1357) Resp:  [12-28] 20  (12/20 1357) BP: (118-161)/(68-91) 148/68 mmHg (12/20 1536) SpO2:  [100 %] 100 % (12/20 1357) Weight:  [117.482 kg (259 lb)] 259 lb (117.482 kg) (12/19 2338)  Intake/Output from previous day: 12/19 0701 - 12/20 0700 In: 3000 [P.O.:1500; I.V.:1500] Out: 500 [Urine:500] Intake/Output this shift: Total I/O In: 1380 [P.O.:780; I.V.:600] Out: 1425 [Urine:1425]  Exam:  Sensation intact distally Intact pulses distally Dorsiflexion/Plantar flexion intact  Labs:  Basename 06/11/11 1131  HGB 12.7*    Basename 06/11/11 1131  WBC 7.2  RBC 4.66  HCT 37.9*  PLT 236    Basename 06/11/11 1131  NA 136  K 4.3  CL 102  CO2 28  BUN 13  CREATININE 1.12  GLUCOSE 196*  CALCIUM 9.9    Basename 06/12/11 0332 06/11/11 1131  LABPT -- --  INR 1.06 1.05    Assessment/Plan: Needs pt - needs brace - needs oral pain meds ---all ordered last pm but not yet done On coumadin for dvt prophylaxis Possible dc am if can mobilize   [facility] SCOTT 06/12/2011, 5:52 PM

## 2011-06-13 ENCOUNTER — Encounter (HOSPITAL_COMMUNITY): Payer: Self-pay | Admitting: Orthopedic Surgery

## 2011-06-13 MED ORDER — WARFARIN SODIUM 5 MG PO TABS: 5.0000 mg | ORAL_TABLET | Freq: Every day | ORAL | Status: DC

## 2011-06-13 MED ORDER — WARFARIN SODIUM 5 MG PO TABS
5.0000 mg | ORAL_TABLET | Freq: Every day | ORAL | Status: DC
  Filled 2011-06-13: qty 1

## 2011-06-13 MED ORDER — OXYCODONE HCL 5 MG PO TABS: 5.0000 mg | ORAL_TABLET | ORAL | Status: DC | PRN

## 2011-06-13 MED ORDER — METHOCARBAMOL 500 MG PO TABS: 500.0000 mg | ORAL_TABLET | Freq: Four times a day (QID) | ORAL | Status: DC | PRN

## 2011-06-13 NOTE — Progress Notes (Signed)
Physical Therapy Evaluation Patient Details Name: William Mcguire MRN: [id number] DOB: [birth year] Today's Date: 06/13/2011  8:55-9:25 PT EVal 2  Problem List: There is no problem list on file for this patient.   Past Medical History:  Past Medical History  Diagnosis Date  . Diabetes mellitus    Past Surgical History:  Past Surgical History  Procedure Date  . Knee surgery     torn cartilage    PT Assessment/Plan/Recommendation PT Assessment Clinical Impression Statement: Pt expected to progress well, pain limits activity tolerance. Currently using RW, can progress to crutches when pain decreases and able to bear more weight thru RLE. Can DC home with HHPT, will not have stairs to negotiate.  PT Recommendation/Assessment: Patient will need skilled PT in the acute care venue PT Problem List: Decreased strength;Decreased activity tolerance;Pain;Decreased knowledge of use of DME Barriers to Discharge: None PT Therapy Diagnosis : Difficulty walking;Acute pain PT Plan PT Frequency: 7X/week PT Treatment/Interventions: DME instruction;Gait training;Functional mobility training;Therapeutic activities;Therapeutic exercise;Patient/family education PT Recommendation Follow Up Recommendations: Home health PT Equipment Recommended: Rolling walker with 5" wheels (tall RW) PT Goals  Acute Rehab PT Goals PT Goal Formulation: With patient Time For Goal Achievement: 3 days Pt will go Supine/Side to Sit: with modified independence PT Goal: Supine/Side to Sit - Progress: Not met Pt will go Sit to Stand: with modified independence PT Goal: Sit to Stand - Progress: Not met Pt will Ambulate: 16 - 50 feet PT Goal: Ambulate - Progress: Not met  PT Evaluation Precautions/Restrictions  Precautions Precautions: Other (comment) (knee brace locked in extension when walking) Required Braces or Orthoses: Yes Other Brace/Splint: hinged knee brace locked in extension Restrictions Weight Bearing  Restrictions: Yes RLE Weight Bearing: Weight bearing as tolerated Other Position/Activity Restrictions: knee ext brace when walking Prior Functioning  Home Living Lives With: Spouse;Son Baxter Help From: Family Type of Home: House Home Layout: Two level;Able to live on main level with bedroom/bathroom Home Access: Level entry Home Adaptive Equipment:  (one crutch) Prior Function Level of Independence: Independent with basic ADLs;Independent with homemaking with ambulation;Independent with gait;Independent with transfers Able to Take Stairs?: Yes Driving: Yes Vocation: Full time employment 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, puts up electrical poles Cognition Cognition Arousal/Alertness: Awake/alert Overall Cognitive Status: Appears within functional limits for tasks assessed Orientation Level: Oriented X4 Sensation/Coordination Sensation Light Touch: Appears Intact Coordination Gross Motor Movements are Fluid and Coordinated: Yes Fine Motor Movements are Fluid and Coordinated: Yes Extremity Assessment RUE Assessment RUE Assessment: Within Functional Limits LUE Assessment LUE Assessment: Within Functional Limits RLE Assessment RLE Assessment: Exceptions to [facility] RLE Strength Right Hip Flexion: 2-/5 Right Hip ABduction: 2-/5 LLE Assessment LLE Assessment: Within Functional Limits Mobility (including Balance) Bed Mobility Bed Mobility: Yes Supine to Sit: 4: Min assist Supine to Sit Details (indicate cue type and reason): min A to support RLE Transfers Transfers: Yes Sit to Stand: 5: Supervision;From elevated surface;From bed;With upper extremity assist Sit to Stand Details (indicate cue type and reason): VCs hand placement Stand to Sit: 4: Min assist;To chair/3-in-1;With armrests;With upper extremity assist Stand to Sit Details: min A to support RLE Ambulation/Gait Ambulation/Gait: Yes Ambulation/Gait Assistance: 4: Min assist Ambulation/Gait Assistance  Details (indicate cue type and reason): min/guard assist for balance Ambulation Distance (Feet): 14 Feet Assistive device: Rolling walker Gait Pattern: Step-to pattern;Decreased stance time - right Stairs: No    Exercise  General Exercises - Lower Extremity Ankle Circles/Pumps: AROM;Both;10 reps;Supine Hip ABduction/ADduction: AAROM;Right;5 reps;Supine Straight Leg Raises: AAROM;Right;5 reps;Supine  End of Session PT - End of Session Equipment Utilized During Treatment: Gait belt (R knee brace, locked in extension) Activity Tolerance: Patient limited by pain Patient left: in chair;with call bell in reach Nurse Communication: Mobility status for transfers;Mobility status for ambulation General Behavior During Session: [facility] for tasks performed Cognition: [facility] for tasks performed  Tamala Ser 06/13/2011, 9:42 AM Tamala Ser PT 06/13/2011  [phone]

## 2011-06-13 NOTE — Progress Notes (Signed)
Pt stable vss Dressing changed Dc today after pt and brace F/u 7 days

## 2011-06-13 NOTE — Progress Notes (Signed)
Physical Therapy Treatment Patient Details Name: William Mcguire MRN: [id number] DOB: 21-Apr-1956 Today's Date: 06/13/2011 11:40-11:50 G  PT Assessment/Plan  PT - Assessment/Plan Comments on Treatment Session: Pt tolerated increased gait distance this session. Can DC home from PT standpoint with HHPT. PT Plan: Discharge plan remains appropriate PT Frequency: 7X/week Follow Up Recommendations: Home health PT Equipment Recommended: Rolling walker with 5" wheels PT Goals  Acute Rehab PT Goals PT Goal Formulation: With patient Time For Goal Achievement: 3 days Pt will go Supine/Side to Sit: with modified independence PT Goal: Supine/Side to Sit - Progress: Not met Pt will go Sit to Stand: with modified independence PT Goal: Sit to Stand - Progress: Met Pt will Ambulate: 16 - 50 feet PT Goal: Ambulate - Progress: Met  PT Treatment Precautions/Restrictions  Precautions Precautions: Other (comment) (R knee ext brace) Required Braces or Orthoses: Yes Other Brace/Splint: hinged knee brace locked in extension Restrictions Weight Bearing Restrictions: Yes RLE Weight Bearing: Weight bearing as tolerated Other Position/Activity Restrictions: knee ext brace when walking Mobility (including Balance) Bed Mobility Bed Mobility: No Supine to Sit: 4: Min assist Supine to Sit Details (indicate cue type and reason): min A to support RLE Transfers Transfers: Yes Sit to Stand: 6: Modified independent (Device/Increase time);From chair/3-in-1;With upper extremity assist Sit to Stand Details (indicate cue type and reason): VCs hand placement Stand to Sit: 6: Modified independent (Device/Increase time);To chair/3-in-1;With armrests;With upper extremity assist Stand to Sit Details: min A to support RLE Ambulation/Gait Ambulation/Gait: Yes Ambulation/Gait Assistance: 6: Modified independent (Device/Increase time) Ambulation/Gait Assistance Details (indicate cue type and reason): no LOB Ambulation  Distance (Feet): 45 Feet Assistive device: Rolling walker Gait Pattern: Step-to pattern;Decreased stance time - right Stairs: No    Exercise  General Exercises - Lower Extremity Ankle Circles/Pumps: AROM;Both;10 reps;Supine Hip ABduction/ADduction: AAROM;Right;5 reps;Supine Straight Leg Raises: AAROM;Right;5 reps;Supine End of Session PT - End of Session Equipment Utilized During Treatment: Gait belt Activity Tolerance: Patient limited by pain Patient left: in chair;with call bell in reach Nurse Communication: Mobility status for transfers;Mobility status for ambulation General Behavior During Session: [facility] for tasks performed Cognition: [facility] for tasks performed  Tamala Ser 06/13/2011, 11:59 AM

## 2011-06-13 NOTE — Progress Notes (Signed)
ANTICOAGULATION CONSULT NOTE - Follow Up Consult  Pharmacy Consult for Coumadin Indication: VTE prophylaxis  No Known Allergies  Patient Measurements: Height: 6\' 3"  (190.5 cm) Weight: 259 lb (117.482 kg) IBW/kg (Calculated) : 84.5   Vital Signs: Temp: 100.2 F (37.9 C) (12/21 0550) Temp src: Oral (12/21 0550) BP: 162/92 mmHg (12/21 0550) Pulse Rate: 81  (12/21 0550)  Labs:  Basename 06/13/11 0440 06/12/11 0332 06/11/11 1131  HGB -- -- 12.7*  HCT -- -- 37.9*  PLT -- -- 236  APTT -- -- 30  LABPROT 16.6* 14.0 13.9  INR 1.32 1.06 1.05  HEPARINUNFRC -- -- --  CREATININE -- -- 1.12  CKTOTAL -- -- --  CKMB -- -- --  TROPONINI -- -- --   Estimated Creatinine Clearance: 100.6 ml/min (by C-G formula based on Cr of 1.12).   Medications:  Scheduled:     . amLODipine  10 mg Oral Daily  . atorvastatin  20 mg Oral Daily  . ceFAZolin (ANCEF) IV  1 g Intravenous Q8H  . metFORMIN  1,000 mg Oral BID WC  . pioglitazone  15 mg Oral Daily  . warfarin  10 mg Oral ONCE-1800  . warfarin   Does not apply Once  . DISCONTD: amlodipine-atorvastatin  1 tablet Oral Daily   PRN: HYDROmorphone, methocarbamol(ROBAXIN) IV, methocarbamol, metoCLOPramide (REGLAN) injection, metoCLOPramide, morphine injection, ondansetron (ZOFRAN) IV, ondansetron, oxyCODONE  Assessment: -55 yo M, s/p patellar tendon rupture repair -Warfarin initiation in progress for DVT prophylaxis.  INR beginning to respond. -Has discharge ordered for today.  MD wrote discharge Rx for warfarin 5mg  PO daily.  Goal of Therapy:  INR 2-3   Plan:  -Warfarin as ordered by MD -Needs outpatient PT/INR and warfarin dosage adjustment coordinated by Home Health.  Recommend next INR tomorrow. -Counseled patient regarding warfarin.  He expressed understanding.  Elie Goody, PharmD  [phone]. 06/13/2011 10:41 AM

## 2011-06-13 NOTE — Plan of Care (Signed)
Problem: Discharge Progression Outcomes Goal: Other Discharge Outcomes/Goals Outcome: Completed/Met Date Met:  06/13/11 Home health physical therapy and RN for coumadin

## 2011-06-13 NOTE — Progress Notes (Signed)
06/13/11, Kathi Der RNC-MNN, BSN,  Per Alycia Rossetti at [facility], [phone], ext [phone], and Shirley [place], Medical Case Manager, [facility] has been arranged for PT services.  To start on 12/28 for PT.  Pt's doctor is aware.  Pt has been changed to Xarelto and will not need RN now per pt's doctor.  Pt has tall rolling walker with 5" wheels as recommended by PT from [facility], authorized by Shirley [place], Medical Case Manager.

## 2011-06-20 ENCOUNTER — Inpatient Hospital Stay (HOSPITAL_COMMUNITY)
Admission: EM | Admit: 2011-06-20 | Discharge: 2011-06-23 | DRG: 690 | Disposition: A | Discharge status: Home / Self Care | Payer: 59 | Attending: Internal Medicine | Admitting: Internal Medicine

## 2011-06-20 ENCOUNTER — Encounter (HOSPITAL_COMMUNITY): Payer: Self-pay | Admitting: Emergency Medicine

## 2011-06-20 ENCOUNTER — Emergency Department (HOSPITAL_COMMUNITY): Payer: 59

## 2011-06-20 DIAGNOSIS — N179 Acute kidney failure, unspecified: Secondary | ICD-10-CM | POA: Diagnosis present

## 2011-06-20 DIAGNOSIS — E119 Type 2 diabetes mellitus without complications: Secondary | ICD-10-CM | POA: Diagnosis present

## 2011-06-20 DIAGNOSIS — M25569 Pain in unspecified knee: Secondary | ICD-10-CM | POA: Diagnosis present

## 2011-06-20 DIAGNOSIS — I10 Essential (primary) hypertension: Secondary | ICD-10-CM | POA: Diagnosis present

## 2011-06-20 DIAGNOSIS — N12 Tubulo-interstitial nephritis, not specified as acute or chronic: Secondary | ICD-10-CM | POA: Diagnosis present

## 2011-06-20 DIAGNOSIS — K219 Gastro-esophageal reflux disease without esophagitis: Secondary | ICD-10-CM

## 2011-06-20 HISTORY — DX: Essential (primary) hypertension: I10

## 2011-06-20 LAB — DIFFERENTIAL
Basophils Absolute: 0 10*3/uL (ref 0.0–0.1)
Basophils Relative: 0 % (ref 0–1)
Eosinophils Absolute: 0 10*3/uL (ref 0.0–0.7)
Eosinophils Relative: 0 % (ref 0–5)
Lymphocytes Relative: 4 % — ABNORMAL LOW (ref 12–46)
Lymphs Abs: 0.7 10*3/uL (ref 0.7–4.0)
Monocytes Absolute: 1.2 10*3/uL — ABNORMAL HIGH (ref 0.1–1.0)
Monocytes Relative: 7 % (ref 3–12)
Neutro Abs: 16.7 10*3/uL — ABNORMAL HIGH (ref 1.7–7.7)
Neutrophils Relative %: 90 % — ABNORMAL HIGH (ref 43–77)

## 2011-06-20 LAB — URINE MICROSCOPIC-ADD ON

## 2011-06-20 LAB — BASIC METABOLIC PANEL
BUN: 17 mg/dL (ref 6–23)
CO2: 26 mEq/L (ref 19–32)
Calcium: 9.8 mg/dL (ref 8.4–10.5)
Chloride: 97 mEq/L (ref 96–112)
Creatinine, Ser: 1.22 mg/dL (ref 0.50–1.35)
GFR calc Af Amer: 74 mL/min — ABNORMAL LOW (ref >90)
GFR calc non Af Amer: 64 mL/min — ABNORMAL LOW (ref >90)
Glucose, Bld: 171 mg/dL — ABNORMAL HIGH (ref 70–99)
Potassium: 4.2 mEq/L (ref 3.5–5.1)
Sodium: 134 mEq/L — ABNORMAL LOW (ref 135–145)

## 2011-06-20 LAB — CBC
HCT: 37.7 % — ABNORMAL LOW (ref 39.0–52.0)
Hemoglobin: 12.8 g/dL — ABNORMAL LOW (ref 13.0–17.0)
MCH: 27.6 pg (ref 26.0–34.0)
MCHC: 34 g/dL (ref 30.0–36.0)
MCV: 81.4 fL (ref 78.0–100.0)
Platelets: 216 10*3/uL (ref 150–400)
RBC: 4.63 MIL/uL (ref 4.22–5.81)
RDW: 12.7 % (ref 11.5–15.5)
WBC: 18.6 10*3/uL — ABNORMAL HIGH (ref 4.0–10.5)

## 2011-06-20 LAB — URINALYSIS, ROUTINE W REFLEX MICROSCOPIC
Bilirubin Urine: NEGATIVE
Glucose, UA: NEGATIVE mg/dL
Ketones, ur: 15 mg/dL — AB
Nitrite: NEGATIVE
Protein, ur: NEGATIVE mg/dL
Specific Gravity, Urine: 1.021 (ref 1.005–1.030)
Urobilinogen, UA: 1 mg/dL (ref 0.0–1.0)
pH: 6 (ref 5.0–8.0)

## 2011-06-20 LAB — SEDIMENTATION RATE: Sed Rate: 44 mm/hr — ABNORMAL HIGH (ref 0–16)

## 2011-06-20 MED ORDER — CIPROFLOXACIN IN D5W 400 MG/200ML IV SOLN
400.0000 mg | Freq: Two times a day (BID) | INTRAVENOUS | Status: DC
  Administered 2011-06-20 – 2011-06-23 (×6): 400 mg via INTRAVENOUS
  Filled 2011-06-20 (×7): qty 200

## 2011-06-20 MED ORDER — HYDROMORPHONE HCL PF 1 MG/ML IJ SOLN: 0.5000 mg | INTRAMUSCULAR | Status: DC | PRN

## 2011-06-20 MED ORDER — ROSUVASTATIN CALCIUM 10 MG PO TABS
10.0000 mg | ORAL_TABLET | Freq: Every day | ORAL | Status: DC
  Administered 2011-06-22: 10 mg via ORAL
  Filled 2011-06-20 (×3): qty 1

## 2011-06-20 MED ORDER — INSULIN ASPART 100 UNIT/ML ~~LOC~~ SOLN
0.0000 [IU] | Freq: Every day | SUBCUTANEOUS | Status: DC
Start: 2011-06-20 — End: 2011-06-23

## 2011-06-20 MED ORDER — PIOGLITAZONE HCL 15 MG PO TABS
15.0000 mg | ORAL_TABLET | Freq: Every day | ORAL | Status: DC
  Administered 2011-06-21 – 2011-06-23 (×3): 15 mg via ORAL
  Filled 2011-06-20 (×4): qty 1

## 2011-06-20 MED ORDER — ONDANSETRON HCL 4 MG/2ML IJ SOLN: 4.0000 mg | Freq: Four times a day (QID) | INTRAMUSCULAR | Status: DC | PRN

## 2011-06-20 MED ORDER — INSULIN ASPART 100 UNIT/ML ~~LOC~~ SOLN
0.0000 [IU] | Freq: Three times a day (TID) | SUBCUTANEOUS | Status: DC
  Administered 2011-06-21: 1 [IU] via SUBCUTANEOUS
  Administered 2011-06-21: 2 [IU] via SUBCUTANEOUS
  Administered 2011-06-21: 3 [IU] via SUBCUTANEOUS
  Administered 2011-06-22 – 2011-06-23 (×3): 2 [IU] via SUBCUTANEOUS
  Filled 2011-06-20: qty 3

## 2011-06-20 MED ORDER — ZOLPIDEM TARTRATE 5 MG PO TABS: 5.0000 mg | ORAL_TABLET | Freq: Every evening | ORAL | Status: DC | PRN

## 2011-06-20 MED ORDER — IBUPROFEN 800 MG PO TABS
800.0000 mg | ORAL_TABLET | Freq: Once | ORAL | Status: AC
  Administered 2011-06-20: 800 mg via ORAL
  Filled 2011-06-20: qty 1

## 2011-06-20 MED ORDER — SIMVASTATIN 40 MG PO TABS
40.0000 mg | ORAL_TABLET | Freq: Every day | ORAL | Status: DC
  Administered 2011-06-20: 40 mg via ORAL
  Filled 2011-06-20: qty 1

## 2011-06-20 MED ORDER — ENALAPRIL MALEATE 5 MG PO TABS
5.0000 mg | ORAL_TABLET | Freq: Two times a day (BID) | ORAL | Status: DC
  Administered 2011-06-20 – 2011-06-23 (×6): 5 mg via ORAL
  Filled 2011-06-20 (×7): qty 1

## 2011-06-20 MED ORDER — RIVAROXABAN 10 MG PO TABS
10.0000 mg | ORAL_TABLET | Freq: Every day | ORAL | Status: DC
  Administered 2011-06-20 – 2011-06-22 (×3): 10 mg via ORAL
  Filled 2011-06-20 (×5): qty 1

## 2011-06-20 MED ORDER — ACETAMINOPHEN 650 MG RE SUPP: 650.0000 mg | Freq: Four times a day (QID) | RECTAL | Status: DC | PRN

## 2011-06-20 MED ORDER — SODIUM CHLORIDE 0.9 % IV SOLN
INTRAVENOUS | Status: DC
  Administered 2011-06-20 – 2011-06-23 (×3): via INTRAVENOUS

## 2011-06-20 MED ORDER — ACETAMINOPHEN 500 MG PO TABS
1000.0000 mg | ORAL_TABLET | ORAL | Status: AC
  Administered 2011-06-20: 1000 mg via ORAL
  Filled 2011-06-20: qty 2

## 2011-06-20 MED ORDER — METFORMIN HCL 500 MG PO TABS: 1000.0000 mg | ORAL_TABLET | Freq: Two times a day (BID) | ORAL | Status: DC

## 2011-06-20 MED ORDER — ASPIRIN EC 81 MG PO TBEC
81.0000 mg | DELAYED_RELEASE_TABLET | Freq: Every day | ORAL | Status: DC
  Administered 2011-06-20 – 2011-06-23 (×4): 81 mg via ORAL
  Filled 2011-06-20 (×4): qty 1

## 2011-06-20 MED ORDER — ONDANSETRON HCL 4 MG PO TABS: 4.0000 mg | ORAL_TABLET | Freq: Four times a day (QID) | ORAL | Status: DC | PRN

## 2011-06-20 MED ORDER — ACETAMINOPHEN 325 MG PO TABS
650.0000 mg | ORAL_TABLET | Freq: Four times a day (QID) | ORAL | Status: DC | PRN
  Administered 2011-06-21 (×2): 650 mg via ORAL
  Filled 2011-06-20 (×2): qty 2

## 2011-06-20 MED ORDER — AMLODIPINE BESYLATE 10 MG PO TABS
10.0000 mg | ORAL_TABLET | Freq: Every day | ORAL | Status: DC
  Administered 2011-06-20 – 2011-06-22 (×2): 10 mg via ORAL
  Filled 2011-06-20 (×4): qty 1

## 2011-06-20 MED ORDER — OXYCODONE HCL 5 MG PO TABS: 5.0000 mg | ORAL_TABLET | ORAL | Status: DC | PRN

## 2011-06-20 MED ORDER — CIPROFLOXACIN IN D5W 400 MG/200ML IV SOLN
400.0000 mg | Freq: Once | INTRAVENOUS | Status: AC
  Administered 2011-06-20: 400 mg via INTRAVENOUS
  Filled 2011-06-20: qty 200

## 2011-06-20 MED ORDER — AMLODIPINE-ATORVASTATIN 10-20 MG PO TABS: 1.0000 | ORAL_TABLET | Freq: Every day | ORAL | Status: DC

## 2011-06-20 MED ORDER — ALUM & MAG HYDROXIDE-SIMETH 200-200-20 MG/5ML PO SUSP
30.0000 mL | Freq: Four times a day (QID) | ORAL | Status: DC | PRN
  Administered 2011-06-21: 30 mL via ORAL
  Filled 2011-06-20: qty 30

## 2011-06-20 MED ORDER — METFORMIN HCL 500 MG PO TABS
1000.0000 mg | ORAL_TABLET | Freq: Two times a day (BID) | ORAL | Status: DC
  Administered 2011-06-21 – 2011-06-22 (×4): 1000 mg via ORAL
  Filled 2011-06-20 (×8): qty 2

## 2011-06-20 MED ORDER — ENOXAPARIN SODIUM 40 MG/0.4ML ~~LOC~~ SOLN
40.0000 mg | SUBCUTANEOUS | Status: DC
  Filled 2011-06-20: qty 0.4

## 2011-06-20 NOTE — ED Notes (Signed)
EDP speaking with patient.  

## 2011-06-20 NOTE — ED Provider Notes (Signed)
History     CSN: [id number]  Arrival date & time 06/20/11  1135   First MD Initiated Contact with Patient 06/20/11 1235      Chief Complaint  Patient presents with  . Knee Pain    (Consider location/radiation/quality/duration/timing/severity/associated sxs/prior treatment) HPI Patient is an emergency department postop day 8 from a patellar tendon rupture repair.  Patient saw Dr. Lajoyce Corners yesterday who is in for Dr. August Saucer.  The wife states that he developed fever last night and just general discomfort.  The pain in his right knee increased.  She states that the area of his knee felt hotter than it had been and more swollen.  Patient is a diabetic as well.  He has had 2 episodes of vomiting at home. The patient is also having some lower back pain. Past Medical History  Diagnosis Date  . Diabetes mellitus   . Hypertension     Past Surgical History  Procedure Date  . Knee surgery     torn cartilage  . Patellar tendon repair 06/11/2011    Procedure: PATELLA TENDON REPAIR;  Surgeon: Cammy Copa;  Location: [facility] ORS;  Service: Orthopedics;  Laterality: Right;    No family history on file.  History  Substance Use Topics  . Smoking status: Never Smoker   . Smokeless tobacco: Never Used  . Alcohol Use: No      Review of Systems All pertinent positives and negatives reviewed in the history of present illness  Allergies  Review of patient's allergies indicates no known allergies.  Home Medications   Current Outpatient Rx  Name Route Sig Dispense Refill  . AMLODIPINE-ATORVASTATIN 10-20 MG PO TABS Oral Take 1 tablet by mouth daily.     . ASPIRIN EC 81 MG PO TBEC Oral Take 81 mg by mouth daily.      . ENALAPRIL MALEATE 5 MG PO TABS Oral Take 5 mg by mouth 2 (two) times daily.      . IBUPROFEN 200 MG PO TABS Oral Take 600 mg by mouth 2 (two) times daily.      [name] VICTOZA [place] Subcutaneous Inject 1.8 mg into the skin at bedtime.     [name] METFORMIN HCL 1000 MG PO TABS Oral Take 1,000  mg by mouth 2 (two) times daily with a meal.     . OXYCODONE HCL 5 MG PO TABS Oral Take 5 mg by mouth 3 (three) times daily as needed. For pain     . PIOGLITAZONE HCL 15 MG PO TABS Oral Take 15 mg by mouth daily.     [name] RIVAROXABAN 10 MG PO TABS Oral Take 10 mg by mouth daily.        BP 174/78  Pulse 120  Temp(Src) 103.4 F (39.7 C) (Oral)  Resp 18  SpO2 96%  Physical Exam  Constitutional: He is oriented to person, place, and time. He appears well-developed and well-nourished. He appears distressed.  HENT:  Head: Normocephalic and atraumatic.  Eyes: Pupils are equal, round, and reactive to light.  Cardiovascular: Normal rate and regular rhythm.  Exam reveals no gallop and no friction rub.   No murmur heard. Pulmonary/Chest: Effort normal and breath sounds normal. No respiratory distress. He has no wheezes. He has no rales.  Musculoskeletal:       Legs: Neurological: He is alert and oriented to person, place, and time.  Skin: Skin is warm and dry.    ED Course  Procedures (including critical care time)  Labs Reviewed  BASIC METABOLIC PANEL - Abnormal; Notable for the following:    Sodium 134 (*)    Glucose, Bld 171 (*)    GFR calc non Af Amer 64 (*)    GFR calc Af Amer 74 (*)    All other components within normal limits  CBC - Abnormal; Notable for the following:    WBC 18.6 (*)    Hemoglobin 12.8 (*)    HCT 37.7 (*)    All other components within normal limits  DIFFERENTIAL - Abnormal; Notable for the following:    Neutrophils Relative 90 (*)    Neutro Abs 16.7 (*)    Lymphocytes Relative 4 (*)    Monocytes Absolute 1.2 (*)    All other components within normal limits  URINALYSIS, ROUTINE W REFLEX MICROSCOPIC   Dg Knee Complete 4 Views Right  06/20/2011  *RADIOLOGY REPORT*  Clinical Data: Right knee pain (right knee surgery 12/19)  RIGHT KNEE - COMPLETE 4+ VIEW  Comparison: 06/11/2011  Findings:  No fracture.  Previously noted patella alta is improved in the  interval compatible with provided history of interval operative repair of previously identified patellar tendon rupture.  There is nonspecific extensive soft tissue swelling about the anterior aspect of the knee. The subcutaneous emphysema or definite evidence of osteolysis.  No radiopaque foreign body.  Grossly unchanged mild to moderate tricompartmental degenerative change with joint space loss, subchondral sclerosis and osteophytosis. Enthesopathic change of the superior and inferior poles of the patella.  Spurring the tibial spines.  No evidence of chondrocalcinosis.  IMPRESSION: 1.  Improved patellar alta, compatible with history of operative repair, though note the integrity of the patellar tendon is not well evaluated on this examination. 2.  Nonspecific soft tissue swelling about the anterior aspect of the knee, possibly postsurgical in etiology.  3.  Unchanged mild to moderate tricompartmental degenerative change.  Original Report Authenticated By: Waynard Reeds, M.D.     Patient will be treated for pyelonephritis.  There was a misinterpretation of the lab, part and it does appear that the urine is the source of infection rather than his knee.  MDM  MDM Reviewed: nursing note, previous chart and vitals Reviewed previous: labs and x-ray Interpretation: x-ray and labs      Dr. Ophelia Charter saw the patient and felt like that this is not his knee.  There was some misinterpretation in the  labs he does have a urinalysis that shows UTI.  Will give him IV therapy for that.       Carlyle Dolly, PA-C 06/20/11 1547  Carlyle Dolly, PA-C 06/20/11 1630  Carlyle Dolly, PA-C 06/20/11 1751

## 2011-06-20 NOTE — ED Notes (Signed)
Surgery 9 days ago for knee repair.  Knee and body is hot to touch, c/o N/V and fever   180/80, 120 pulse, 22 resp,

## 2011-06-20 NOTE — ED Notes (Signed)
Pt had knee surgery 10 days ago at [facility].  Pt states he started feeling achy, chilled and knee was uncomfortable this morning.  Rt knee is hot to touch and pt is shaking.

## 2011-06-20 NOTE — H&P (Addendum)
DATE OF ADMISSION:  06/20/2011  PCP:  Dr. [name]   Chief Complaint:    HPI: William Mcguire is an 55 y.o. male complaints of fever and chills and malaise that started in the AM.  Patient denies dysuria, he has had increased back pain.  Patient ahd a recent Right Knee surgery a repair of a ruptured tendon on December 19th by Orthopedic Surgeon Dr. August Saucer.  He denies any increase in pain of the knee or redness or drainage from the surgical incision site.    Past Medical History  Diagnosis Date  . Diabetes mellitus   . Hypertension     Past Surgical History  Procedure Date  . Knee surgery     torn cartilage  . Patellar tendon repair 06/11/2011    Procedure: PATELLA TENDON REPAIR;  Surgeon: Cammy Copa;  Location: [facility];  Service: Orthopedics;  Laterality: Right;    Medications:  HOME MEDS: Prior to Admission medications   Medication Sig Start Date End Date Taking? Authorizing Provider  amlodipine-atorvastatin (CADUET) 10-20 MG per tablet Take 1 tablet by mouth daily.    Yes Historical Provider, MD  aspirin EC 81 MG tablet Take 81 mg by mouth daily.     Yes Historical Provider, MD  enalapril (VASOTEC) 5 MG tablet Take 5 mg by mouth 2 (two) times daily.     Yes Historical Provider, MD  ibuprofen (ADVIL,MOTRIN) 200 MG tablet Take 600 mg by mouth 2 (two) times daily.     Yes Historical Provider, MD  Liraglutide (VICTOZA [place]) Inject 1.8 mg into the skin at bedtime.    Yes Historical Provider, MD  metFORMIN (GLUCOPHAGE) 1000 MG tablet Take 1,000 mg by mouth 2 (two) times daily with a meal.    Yes Historical Provider, MD  oxyCODONE (OXY IR/ROXICODONE) 5 MG immediate release tablet Take 5 mg by mouth 3 (three) times daily as needed. For pain  06/13/11 06/23/11 Yes Corrie Mckusick Dean  pioglitazone (ACTOS) 15 MG tablet Take 15 mg by mouth daily.    Yes Historical Provider, MD  rivaroxaban (XARELTO) 10 MG TABS tablet Take 10 mg by mouth daily.   06/13/11 06/27/11 Yes Historical  Provider, MD    Allergies:  No Known Allergies  Social History:   reports that he has never smoked. He has never used smokeless tobacco. He reports that he does not drink alcohol or use illicit drugs.  Family History: No family history on file.  Review of Systems:  The patient denies anorexia, fever, weight loss,, vision loss, decreased hearing, hoarseness, chest pain, syncope, dyspnea on exertion, peripheral edema, balance deficits, hemoptysis, abdominal pain, melena, hematochezia, severe indigestion/heartburn, hematuria, incontinence, genital sores, muscle weakness, suspicious skin lesions, transient blindness, difficulty walking, depression, unusual weight change, abnormal bleeding, enlarged lymph nodes, angioedema, and breast masses.   Physical Exam:  GEN:  Pleasant examined  and in no acute distress; cooperative with exam Filed Vitals:   06/20/11 1401 06/20/11 1738 06/20/11 1759 06/20/11 1900  BP: 174/78 138/83  158/92  Pulse: 120 115  116  Temp: 103.4 F (39.7 C)  102.7 F (39.3 C) 101.3 F (38.5 C)  TempSrc: Oral  Oral Oral  Resp: 18 16  20   SpO2: 96% 98%  98%   Blood pressure 158/92, pulse 116, temperature 101.3 F (38.5 C), temperature source Oral, resp. rate 20, SpO2 98.00%. PSYCH: He is alert and oriented x4; does not appear anxious does not appear depressed; affect is normal HEENT: Normocephalic and Atraumatic, Mucous membranes  pink; PERRLA; EOM intact; Fundi:  Benign;  No scleral icterus, Nares: Patent, Oropharynx: Clear, Fair Dentition, Neck:  FROM, no cervical lymphadenopathy nor thyromegaly or carotid bruit; no JVD; Breasts:: Not examined CHEST WALL: No tenderness CHEST: Normal respiration, clear to auscultation bilaterally HEART: Regular rate and rhythm; no murmurs rubs or gallops BACK: No kyphosis or scoliosis; no CVA tenderness ABDOMEN: Positive Bowel Sounds, Obese, soft non-tender; no masses, no organomegaly.   EXTREMITIES:No cyanosis, clubbing or edema; no  ulcerations. Genitalia: not examined PULSES: 2+ and symmetric SKIN: Normal hydration no rash or ulceration,  CNS: Cranial nerves 2-12 grossly intact no focal neurologic deficit   Labs & Imaging Results for orders placed during the hospital encounter of 06/20/11 (from the past 48 hour(s))  BASIC METABOLIC PANEL     Status: Abnormal   Collection Time   06/20/11  1:02 PM      Component Value Range Comment   Sodium 134 (*) 135 - 145 (mEq/L)    Potassium 4.2  3.5 - 5.1 (mEq/L)    Chloride 97  96 - 112 (mEq/L)    CO2 26  19 - 32 (mEq/L)    Glucose, Bld 171 (*) 70 - 99 (mg/dL)    BUN 17  6 - 23 (mg/dL)    Creatinine, Ser 4.09  0.50 - 1.35 (mg/dL)    Calcium 9.8  8.4 - 10.5 (mg/dL)    GFR calc non Af Amer 64 (*) >90 (mL/min)    GFR calc Af Amer 74 (*) >90 (mL/min)   CBC     Status: Abnormal   Collection Time   06/20/11  1:02 PM      Component Value Range Comment   WBC 18.6 (*) 4.0 - 10.5 (K/uL)    RBC 4.63  4.22 - 5.81 (MIL/uL)    Hemoglobin 12.8 (*) 13.0 - 17.0 (g/dL)    HCT 81.1 (*) 91.4 - 52.0 (%)    MCV 81.4  78.0 - 100.0 (fL)    MCH 27.6  26.0 - 34.0 (pg)    MCHC 34.0  30.0 - 36.0 (g/dL)    RDW 78.2  95.6 - 21.3 (%)    Platelets 216  150 - 400 (K/uL)   DIFFERENTIAL     Status: Abnormal   Collection Time   06/20/11  1:02 PM      Component Value Range Comment   Neutrophils Relative 90 (*) 43 - 77 (%)    Neutro Abs 16.7 (*) 1.7 - 7.7 (K/uL)    Lymphocytes Relative 4 (*) 12 - 46 (%)    Lymphs Abs 0.7  0.7 - 4.0 (K/uL)    Monocytes Relative 7  3 - 12 (%)    Monocytes Absolute 1.2 (*) 0.1 - 1.0 (K/uL)    Eosinophils Relative 0  0 - 5 (%)    Eosinophils Absolute 0.0  0.0 - 0.7 (K/uL)    Basophils Relative 0  0 - 1 (%)    Basophils Absolute 0.0  0.0 - 0.1 (K/uL)   URINALYSIS, ROUTINE W REFLEX MICROSCOPIC     Status: Abnormal   Collection Time   06/20/11  2:35 PM      Component Value Range Comment   Color, Urine YELLOW  YELLOW     APPearance CLOUDY (*) CLEAR     Specific  Gravity, Urine 1.021  1.005 - 1.030     pH 6.0  5.0 - 8.0     Glucose, UA NEGATIVE  NEGATIVE (mg/dL)    Hgb urine  dipstick SMALL (*) NEGATIVE     Bilirubin Urine NEGATIVE  NEGATIVE     Ketones, ur 15 (*) NEGATIVE (mg/dL)    Protein, ur NEGATIVE  NEGATIVE (mg/dL)    Urobilinogen, UA 1.0  0.0 - 1.0 (mg/dL)    Nitrite NEGATIVE  NEGATIVE     Leukocytes, UA LARGE (*) NEGATIVE    URINE MICROSCOPIC-ADD ON     Status: Abnormal   Collection Time   06/20/11  2:35 PM      Component Value Range Comment   Squamous Epithelial / LPF RARE  RARE     WBC, UA TOO NUMEROUS TO COUNT  <3 (WBC/hpf)    RBC / HPF 0-2  <3 (RBC/hpf)    Bacteria, UA MANY (*) RARE    SEDIMENTATION RATE     Status: Abnormal   Collection Time   06/20/11  3:52 PM      Component Value Range Comment   Sed Rate 44 (*) 0 - 16 (mm/hr)    Dg Chest 2 View  06/20/2011  *RADIOLOGY REPORT*  Clinical Data: Fever.  Recent knee surgery.  Possible infection. Preop for repeat surgery.  CHEST - 2 VIEW  Comparison: Two-view chest 06/11/2011.  Findings: The heart size is normal.  The lungs are clear.  The visualized soft tissues and bony thorax are unremarkable.  IMPRESSION:  1.  No acute cardiopulmonary disease or significant interval change.  Original Report Authenticated By: Jamesetta Orleans. MATTERN, M.D.   Dg Knee Complete 4 Views Right  06/20/2011  *RADIOLOGY REPORT*  Clinical Data: Right knee pain (right knee surgery 12/19)  RIGHT KNEE - COMPLETE 4+ VIEW  Comparison: 06/11/2011  Findings:  No fracture.  Previously noted patella alta is improved in the interval compatible with provided history of interval operative repair of previously identified patellar tendon rupture.  There is nonspecific extensive soft tissue swelling about the anterior aspect of the knee. The subcutaneous emphysema or definite evidence of osteolysis.  No radiopaque foreign body.  Grossly unchanged mild to moderate tricompartmental degenerative change with joint space loss,  subchondral sclerosis and osteophytosis. Enthesopathic change of the superior and inferior poles of the patella.  Spurring the tibial spines.  No evidence of chondrocalcinosis.  IMPRESSION: 1.  Improved patellar alta, compatible with history of operative repair, though note the integrity of the patellar tendon is not well evaluated on this examination. 2.  Nonspecific soft tissue swelling about the anterior aspect of the knee, possibly postsurgical in etiology.  3.  Unchanged mild to moderate tricompartmental degenerative change.  Original Report Authenticated By: Waynard Reeds, M.D.      Assessment/Plan: 1. Pyelonephritis- IV Cipro, and IVFs 2.  DM2- Sliding Scale Insulin coverage 3.  HTN- continue Regular Meds. 4.  Anemia- Monitor.  5.  Recent Right Knee Tendon Repair- notify Ortho, and monitor Wound.   6.  DVT prophylaxis -already on xarelto, continue xarelto.      Other plans as per orders.    CODE STATUS:      FULL CODE        Gleen Ripberger C 06/20/2011, 8:32 PM

## 2011-06-20 NOTE — Progress Notes (Signed)
PHARMACIST - PHYSICIAN COMMUNICATION DR:   Damita Dunnings.al. CONCERNING: Pt on Amlodipine and Simvastatin.  The maximum recommended dose of Simvastatin is 20mg  in pts also receiving Norvasc.  DESCRIPTION:  Have substituted an equivalent dose of Crestor for Zocor

## 2011-06-20 NOTE — ED Notes (Signed)
[id number] READY

## 2011-06-20 NOTE — ED Notes (Signed)
Nurse unable to take report at this time.

## 2011-06-21 ENCOUNTER — Inpatient Hospital Stay (HOSPITAL_COMMUNITY): Payer: 59

## 2011-06-21 LAB — GLUCOSE, CAPILLARY
Glucose-Capillary: 142 mg/dL — ABNORMAL HIGH (ref 70–99)
Glucose-Capillary: 185 mg/dL — ABNORMAL HIGH (ref 70–99)

## 2011-06-21 LAB — BASIC METABOLIC PANEL
Chloride: 97 mEq/L (ref 96–112)
Creatinine, Ser: 1.5 mg/dL — ABNORMAL HIGH (ref 0.50–1.35)
GFR calc Af Amer: 58 mL/min — ABNORMAL LOW (ref >90)
GFR calc non Af Amer: 50 mL/min — ABNORMAL LOW (ref >90)
Potassium: 4 mEq/L (ref 3.5–5.1)

## 2011-06-21 LAB — CBC
HCT: 36.3 % — ABNORMAL LOW (ref 39.0–52.0)
MCHC: 33.1 g/dL (ref 30.0–36.0)
RDW: 13.1 % (ref 11.5–15.5)
WBC: 22.7 10*3/uL — ABNORMAL HIGH (ref 4.0–10.5)

## 2011-06-21 LAB — HEMOGLOBIN A1C: Hgb A1c MFr Bld: 8.8 % — ABNORMAL HIGH (ref <5.7)

## 2011-06-21 LAB — C-REACTIVE PROTEIN: CRP: 5.02 mg/dL — ABNORMAL HIGH (ref <0.60)

## 2011-06-21 MED ORDER — PANTOPRAZOLE SODIUM 40 MG PO TBEC
40.0000 mg | DELAYED_RELEASE_TABLET | Freq: Every day | ORAL | Status: DC
  Administered 2011-06-23: 40 mg via ORAL
  Filled 2011-06-21 (×2): qty 1

## 2011-06-21 NOTE — Progress Notes (Signed)
Subjective:  55 year-old patient who was admitted hospital yesterday with fever chills and  Pyuria;  he was admitted with suspected pyelonephritis and has been treated with Cipro. He feels modestly improved but still having fever and mild resting tachycardia. He's had no prior history of UTIs He has type 2 diabetes and is on a sliding scale regimen of insulin. White blood cell count today has increased to 22.7  Objective: Vital signs in last 24 hours: Temp:  [98.9 F (37.2 C)-102.7 F (39.3 C)] 99.8 F (37.7 C) (12/29 0845) Pulse Rate:  [88-116] 109  (12/29 0845) Resp:  [16-20] 20  (12/29 0845) BP: (120-158)/(73-92) 128/80 mmHg (12/29 0845) SpO2:  [95 %-99 %] 95 % (12/29 0845) Weight change:  Last BM Date: 06/19/11  CBG (last 3)   Basename 06/21/11 1056  GLUCAP 241*    BP Readings from Last 3 Encounters:  06/21/11 128/80  06/13/11 162/92  06/13/11 162/92    Intake/Output from previous day: 12/28 0701 - 12/29 0700 In: 1140 [P.O.:240; I.V.:900] Out: 600 [Urine:600] Intake/Output this shift: Total I/O In: 120 [P.O.:120] Out: -   Physical examination  Gen. exam revealed the patient alert comfortable in no distress Skin was warm and dry without rash HEENT exam revealed a normal oropharynx Chest was clear without tachypnea Cardiovascular exam revealed normal heart sounds rate was approximately 100 Abdomen benign. No CVA tenderness Extremities negative  Lab Results:  Basename 06/21/11 0600 06/20/11 1302  WBC 22.7* 18.6*  HGB 12.0* 12.8*  HCT 36.3* 37.7*  PLT 219 216   BMET  Basename 06/21/11 0600 06/20/11 1302  NA 133* 134*  K 4.0 4.2  CL 97 97  CO2 27 26  GLUCOSE 184* 171*  BUN 21 17  CREATININE 1.50* 1.22  CALCIUM 9.5 9.8    Studies/Results: Dg Chest 2 View  06/20/2011  *RADIOLOGY REPORT*  Clinical Data: Fever.  Recent knee surgery.  Possible infection. Preop for repeat surgery.  CHEST - 2 VIEW  Comparison: Two-view chest 06/11/2011.  Findings: The  heart size is normal.  The lungs are clear.  The visualized soft tissues and bony thorax are unremarkable.  IMPRESSION:  1.  No acute cardiopulmonary disease or significant interval change.  Original Report Authenticated By: Jamesetta Orleans. MATTERN, M.D.   US Renal  06/21/2011  *RADIOLOGY REPORT*  Clinical Data: Pyelonephritis.  Elevated creatinine.  RENAL/URINARY TRACT ULTRASOUND COMPLETE  Comparison:  Findings:  Right Kidney:  Normal.  11.3 cm in length.  Left Kidney:  10.6 cm in length.  Possible tiny stone in the midportion of the kidney.  Renal cortex is normal.  No obstruction or abscess.  Bladder:  Normal.  IMPRESSION: Possible tiny stone in the mid left kidney.  Otherwise normal appearing kidneys with no abscess or obstruction or perinephric fluid.  Original Report Authenticated By: Gwynn Burly, M.D.   Dg Knee Complete 4 Views Right  06/20/2011  *RADIOLOGY REPORT*  Clinical Data: Right knee pain (right knee surgery 12/19)  RIGHT KNEE - COMPLETE 4+ VIEW  Comparison: 06/11/2011  Findings:  No fracture.  Previously noted patella alta is improved in the interval compatible with provided history of interval operative repair of previously identified patellar tendon rupture.  There is nonspecific extensive soft tissue swelling about the anterior aspect of the knee. The subcutaneous emphysema or definite evidence of osteolysis.  No radiopaque foreign body.  Grossly unchanged mild to moderate tricompartmental degenerative change with joint space loss, subchondral sclerosis and osteophytosis. Enthesopathic change of the superior  and inferior poles of the patella.  Spurring the tibial spines.  No evidence of chondrocalcinosis.  IMPRESSION: 1.  Improved patellar alta, compatible with history of operative repair, though note the integrity of the patellar tendon is not well evaluated on this examination. 2.  Nonspecific soft tissue swelling about the anterior aspect of the knee, possibly postsurgical in  etiology.  3.  Unchanged mild to moderate tricompartmental degenerative change.  Original Report Authenticated By: Waynard Reeds, M.D.    Medications: I have reviewed the patient's current medications.  Assessment/Plan:  Pyelonephritis. We'll continue Cipro pending results of blood cultures and urine C&S Diabetes mellitus. We'll continue sliding scale insulin coverage Hypertension stable we'll continue home meds Status post right patellar tendon repair-  will continue analgesics as necessary  LOS: 1 day   William Mcguire 06/21/2011, 3:04 PM

## 2011-06-22 LAB — GLUCOSE, CAPILLARY: Glucose-Capillary: 170 mg/dL — ABNORMAL HIGH (ref 70–99)

## 2011-06-22 NOTE — Progress Notes (Signed)
Subjective: 55 year old patient who was admitted for an acute UTI and possible pyelonephritis. A renal ultrasound was nonrevealing. He has responded quite well to IV Cipro and today feels quite well. His temperature has normalized. Yesterday his white count was still 22.7. Review of microbiology reveals no blood or urine culture results. He does have type 2 diabetes which has been stable  Objective: Vital signs in last 24 hours: Temp:  [97.8 F (36.6 C)-100.2 F (37.9 C)] 98.1 F (36.7 C) (12/30 1300) Pulse Rate:  [79-102] 80  (12/30 1300) Resp:  [18-20] 20  (12/30 1300) BP: (119-124)/(68-74) 121/73 mmHg (12/30 1300) SpO2:  [96 %-99 %] 98 % (12/30 1300) Weight change:  Last BM Date: 06/19/11  Intake/Output from previous day: 12/29 0701 - 12/30 0700 In: 1170 [P.O.:120; I.V.:1050] Out: -  Intake/Output this shift: Total I/O In: 600 [P.O.:600] Out: 650 [Urine:650]  Gen.- Alert and oriented no complaints feels well HEENT exam oropharynx clear. No thrush appears well-hydrated Chest clear Cardiovascular exam normal S1-S2 no tachycardia Abdomen benign soft and nontender. No CVA tenderness. No suprapubic tenderness Extremities no edema right knee incision looks good. Steri-Strips in place  Lab Results:  Basename 06/21/11 0600 06/20/11 1302  WBC 22.7* 18.6*  HGB 12.0* 12.8*  HCT 36.3* 37.7*  PLT 219 216   BMET  Basename 06/21/11 0600 06/20/11 1302  NA 133* 134*  K 4.0 4.2  CL 97 97  CO2 27 26  GLUCOSE 184* 171*  BUN 21 17  CREATININE 1.50* 1.22  CALCIUM 9.5 9.8    CBG (last 3)   Basename 06/22/11 1120 06/22/11 0638 06/21/11 2202  GLUCAP 184* 170* 185*    BP Readings from Last 3 Encounters:  06/22/11 121/73  06/13/11 162/92  06/13/11 162/92    Studies/Results: Dg Chest 2 View  06/20/2011  *RADIOLOGY REPORT*  Clinical Data: Fever.  Recent knee surgery.  Possible infection. Preop for repeat surgery.  CHEST - 2 VIEW  Comparison: Two-view chest 06/11/2011.   Findings: The heart size is normal.  The lungs are clear.  The visualized soft tissues and bony thorax are unremarkable.  IMPRESSION:  1.  No acute cardiopulmonary disease or significant interval change.  Original Report Authenticated By: Jamesetta Orleans. MATTERN, M.D.   US Renal  06/21/2011  *RADIOLOGY REPORT*  Clinical Data: Pyelonephritis.  Elevated creatinine.  RENAL/URINARY TRACT ULTRASOUND COMPLETE  Comparison:  Findings:  Right Kidney:  Normal.  11.3 cm in length.  Left Kidney:  10.6 cm in length.  Possible tiny stone in the midportion of the kidney.  Renal cortex is normal.  No obstruction or abscess.  Bladder:  Normal.  IMPRESSION: Possible tiny stone in the mid left kidney.  Otherwise normal appearing kidneys with no abscess or obstruction or perinephric fluid.  Original Report Authenticated By: Gwynn Burly, M.D.    Medications: I have reviewed the patient's current medications.  Assessment/Plan:  Acute urinary tract infection. The patient responded well to Cipro. We'll check a CBC to assess the white count tomorrow and if improved plan the patient remains clinically stable we'll consider for discharge at that time Diabetes mellitus. Recently stable Status post right patellar tendon repair   LOS: 2 days   Rogelia Boga 06/22/2011, 2:44 PM

## 2011-06-23 DIAGNOSIS — E119 Type 2 diabetes mellitus without complications: Secondary | ICD-10-CM | POA: Diagnosis present

## 2011-06-23 DIAGNOSIS — N12 Tubulo-interstitial nephritis, not specified as acute or chronic: Secondary | ICD-10-CM | POA: Diagnosis present

## 2011-06-23 DIAGNOSIS — N179 Acute kidney failure, unspecified: Secondary | ICD-10-CM | POA: Diagnosis present

## 2011-06-23 DIAGNOSIS — I10 Essential (primary) hypertension: Secondary | ICD-10-CM | POA: Diagnosis present

## 2011-06-23 LAB — BASIC METABOLIC PANEL
CO2: 24 mEq/L (ref 19–32)
Glucose, Bld: 139 mg/dL — ABNORMAL HIGH (ref 70–99)
Potassium: 3.8 mEq/L (ref 3.5–5.1)
Sodium: 136 mEq/L (ref 135–145)

## 2011-06-23 LAB — GLUCOSE, CAPILLARY
Glucose-Capillary: 135 mg/dL — ABNORMAL HIGH (ref 70–99)
Glucose-Capillary: 189 mg/dL — ABNORMAL HIGH (ref 70–99)

## 2011-06-23 LAB — CBC
HCT: 34 % — ABNORMAL LOW (ref 39.0–52.0)
Hemoglobin: 11.2 g/dL — ABNORMAL LOW (ref 13.0–17.0)
MCV: 82.3 fL (ref 78.0–100.0)
RBC: 4.13 MIL/uL — ABNORMAL LOW (ref 4.22–5.81)
WBC: 9.5 10*3/uL (ref 4.0–10.5)

## 2011-06-23 MED ORDER — CIPROFLOXACIN HCL 500 MG PO TABS
500.0000 mg | ORAL_TABLET | Freq: Two times a day (BID) | ORAL | Status: DC
Start: 2011-06-23 — End: 2011-06-23
  Filled 2011-06-23 (×2): qty 1

## 2011-06-23 MED ORDER — CIPROFLOXACIN HCL 500 MG PO TABS: 500.0000 mg | ORAL_TABLET | Freq: Two times a day (BID) | ORAL | Status: AC

## 2011-06-23 MED ORDER — POTASSIUM CHLORIDE 20 MEQ/15ML (10%) PO LIQD: 40.0000 meq | ORAL | Status: DC

## 2011-06-23 NOTE — Discharge Summary (Signed)
Patient ID: Esli Jernigan MRN: [id number] DOB/AGE: 11/30/1955 55 y.o.  Admit date: 06/20/2011 Discharge date: 06/23/2011  Primary Care Physician:  No primary provider on file.   Discharge Diagnoses:    Present on Admission:  Pyelonephritis AKI DM HTN Recent Right  Knee surgery   Current Discharge Medication List    START taking these medications   Details  ciprofloxacin (CIPRO) 500 MG tablet Take 1 tablet (500 mg total) by mouth 2 (two) times daily. Qty: 10 tablet, Refills: 0      CONTINUE these medications which have NOT CHANGED   Details  amlodipine-atorvastatin (CADUET) 10-20 MG per tablet Take 1 tablet by mouth daily.     aspirin EC 81 MG tablet Take 81 mg by mouth daily.      enalapril (VASOTEC) 5 MG tablet Take 5 mg by mouth 2 (two) times daily.      Liraglutide (VICTOZA [place]) Inject 1.8 mg into the skin at bedtime.     metFORMIN (GLUCOPHAGE) 1000 MG tablet Take 1,000 mg by mouth 2 (two) times daily with a meal.     oxyCODONE (OXY IR/ROXICODONE) 5 MG immediate release tablet Take 5 mg by mouth 3 (three) times daily as needed. For pain     pioglitazone (ACTOS) 15 MG tablet Take 15 mg by mouth daily.     rivaroxaban (XARELTO) 10 MG TABS tablet Take 10 mg by mouth daily.        STOP taking these medications     ibuprofen (ADVIL,MOTRIN) 200 MG tablet          Consults:  None    Significant Diagnostic Studies:  Dg Chest 2 View  06/20/2011  *RADIOLOGY REPORT*  Clinical Data: Fever.  Recent knee surgery.  Possible infection. Preop for repeat surgery.  CHEST - 2 VIEW  Comparison: Two-view chest 06/11/2011.  Findings: The heart size is normal.  The lungs are clear.  The visualized soft tissues and bony thorax are unremarkable.  IMPRESSION:  1.  No acute cardiopulmonary disease or significant interval change.  Original Report Authenticated By: Jamesetta Orleans. MATTERN, M.D.    US Renal  06/21/2011  *RADIOLOGY REPORT*  Clinical Data: Pyelonephritis.  Elevated  creatinine.  RENAL/URINARY TRACT ULTRASOUND COMPLETE  Comparison:  Findings:  Right Kidney:  Normal.  11.3 cm in length.  Left Kidney:  10.6 cm in length.  Possible tiny stone in the midportion of the kidney.  Renal cortex is normal.  No obstruction or abscess.  Bladder:  Normal.  IMPRESSION: Possible tiny stone in the mid left kidney.  Otherwise normal appearing kidneys with no abscess or obstruction or perinephric fluid.  Original Report Authenticated By: Gwynn Burly, M.D.       Brief H and P: For complete details please refer to admission H and P, but in brief Caitlin Hillmer is an 55 y.o. male complaints of fever and chills and malaise that started in the AM. Patient denies dysuria, he has had increased back pain. Patient ahd a recent Right Knee surgery a repair of a ruptured tendon on December 19th by Orthopedic Surgeon Dr. August Saucer. He denies any increase in pain of the knee or redness or drainage from the surgical incision site.    Hospital Course:  UA showed pyuria,labs showed acute kidney injury (creatinine of 1.5)and leukocytosis with WBC of 22K , patient was started on IV ciprofloxacin and IVF ,with improvement in his symptoms and WBC normalized .On review of chart no urine culture found,patient already received antibiotics for 2  days ,will send urine culture knowing that the yield will be low ,patient to follow with PCP for follow up of culture result.patient had renal sonogram that showed possible  tiny stone in the mid left kidney. For AKI ,patient was hydrated with IVF ,  Repeat labs today showed resolution of AKI .I discontinued Ibuprofen from his home medications and advised him to avoid NSAIDS. As far as his recent knee surgery the surgical site is clean with no erythema, warmth or discharge ,patient to follow with Dr August Saucer as scheduled .Will ask case manger to confirm HHPT before discharge .   Subjective: Patient seen and examined ,feeling better ,denies any complaints and very  eager to be discharged    Filed Vitals:   06/23/11 0546  BP: 118/68  Pulse: 79  Temp: 98.3 F (36.8 C)  Resp: 18    General: Alert, awake, oriented x3, in no acute distress. HEENT: No bruits, no goiter. Heart: Regular rate and rhythm, without murmurs, rubs, gallops. Lungs: Clear to auscultation bilaterally. Abdomen: Soft, nontender, nondistended, positive bowel sounds.no CVA tenderness bilaterally Extremities: left knee incision with no erythema,tenderness or warmth    Disposition and Follow-up:  Home  Follow with PCP ASAP  Follow with  Dr August Saucer as scheduled .   Time spent on Discharge: 50 MINUTES   Signed: Paizlie Klaus 06/23/2011, 1:22 PM

## 2011-06-23 NOTE — Progress Notes (Signed)
Patient ID: William Mcguire, male   DOB: 03/15/1956, 55 y.o.   MRN: [id number] Pt. Discharged 06/23/2011  2:20 PM Discharge instructions reviewed with patient/family. Patient/family verbalized understanding. All Rx's given. Questions answered as needed. Pt. Discharged to home with family/self. Taken off unit via W/C. [facility]

## 2011-06-23 NOTE — ED Provider Notes (Signed)
Medical screening examination/treatment/procedure(s) were conducted as a shared visit with non-physician practitioner(s) and myself.  I personally evaluated the patient during the encounter.  Pt is febrile at 103 with no other clinical signs or symptoms for source of fever.  CXR clear.  Concern for infection in post operative site, right knee.  P[employer] Lawyer contacted ortho who will evaluate and admit.  Pt is not septic appearing, may require surgical evaluation and abx.  Deferred choice to orthopedic consultant. William Wissink Y.   William Mcguire. William Bither, MD 06/23/11 1018

## 2011-06-25 LAB — URINE CULTURE

## 2011-06-26 ENCOUNTER — Telehealth: Payer: Self-pay | Admitting: *Deleted

## 2011-06-26 NOTE — Telephone Encounter (Signed)
Wife called upset that he has to wait until tomorrow to be seen. Told her we have a fully booked schedule & no one has cancelled . She wanted to know what else she can do. I suggested going to the hospital. She refused to use the ED. States he is currently doing ok and is afebrile. States he needs to be on IV antibiotics. She terminated the call , still sounding very unhappy about having to wait

## 2011-06-27 ENCOUNTER — Encounter: Payer: Self-pay | Admitting: Infectious Diseases

## 2011-06-27 ENCOUNTER — Other Ambulatory Visit: Payer: Self-pay | Admitting: Infectious Diseases

## 2011-06-27 ENCOUNTER — Ambulatory Visit (INDEPENDENT_AMBULATORY_CARE_PROVIDER_SITE_OTHER): Payer: Worker's Compensation | Admitting: Infectious Diseases

## 2011-06-27 VITALS — BP 137/79 | HR 80 | Temp 98.1°F | Ht 75.0 in | Wt 259.0 lb

## 2011-06-27 DIAGNOSIS — N12 Tubulo-interstitial nephritis, not specified as acute or chronic: Secondary | ICD-10-CM

## 2011-06-27 LAB — URINALYSIS, ROUTINE W REFLEX MICROSCOPIC
Bilirubin Urine: NEGATIVE
Glucose, UA: NEGATIVE mg/dL
Specific Gravity, Urine: 1.021 (ref 1.005–1.030)

## 2011-06-27 LAB — URINALYSIS, MICROSCOPIC ONLY: Crystals: NONE SEEN

## 2011-06-27 NOTE — Progress Notes (Signed)
  Subjective:    Patient ID: William Mcguire, male    DOB: [birth year], 56 y.o.   MRN: [id number]  HPI 56 year old male with a history of type 2 diabetes and a right patellar tendon repair (06-11-11) and was d/c 06-13-11. He was seen in the emergency department (06-20-11) with pain in his R flank, restless, rigors, fever (100.7--> 103 in ED), chills but no dysuria or pyuria. His family felt that his knee ws really hot. He had a white blood cell count of 22,700. He was suspected of having pyelonephritis, and was given ciprofloxacin. His urine culture from the emergency room grew greater than 100,000 colonies of Escherichia coli sensitive to cefepime, amikacin, imipenem, and nitrofurantoin. Intermediately sensitive to ceftriaxone. It is resistant to ciprofloxacin and levofloxacin. He was seen by Ortho in ED and felt that his knee was clear. He was admitted for 4 days, sent home on 06-23-11. Family did not think he had a BCx or UCx done on admission. Had one sent after 3 days.  Since d/c has had no fever. No dysuria, no flank pain.    Review of Systems  Gastrointestinal: Negative for diarrhea and constipation.  Genitourinary: Negative for dysuria and flank pain.  Neurological: Positive for headaches. Negative for numbness.  ROS- no change in vision, has sen ophtho this year. FSG have been good.      Objective:   Physical Exam  Constitutional: He appears well-developed and well-nourished.  HENT:  Mouth/Throat: No oropharyngeal exudate.  Eyes: EOM are normal. Pupils are equal, round, and reactive to light.  Cardiovascular: Normal rate, regular rhythm and normal heart sounds.   Pulmonary/Chest: Effort normal and breath sounds normal. No respiratory distress.  Abdominal: Soft. Bowel sounds are normal. There is no tenderness.  Musculoskeletal:       Legs:         Assessment & Plan:

## 2011-06-27 NOTE — Assessment & Plan Note (Signed)
The patient is currently asymptomatic despite being on any therapy which is presumably ineffective. I offered several alternatives to the patient: He did not truly have a urinary tract infection and the urine culture was a superficial contaminant that was acquired after he had been on antibiotics. 2 he did get a true urinary tract infection during his hospitalization for his patellar repair. He and his wife deny that he had a urinary catheter in place at that time. 3. He has an alternative source for his fever and chills that has been treated with antibiotics he has been given.  I suggested that we repeat his urine culture and UA to see if he has cleared the infection. If he has findings consistent with a urinary tract infection on these tests we will treat him appropriately. We will rely on the expertise of the orthopedic physicians to follow up his knee. The patient will call the office in 3 days for the results of his cultures and need for further interventions.

## 2011-06-29 NOTE — Discharge Summary (Signed)
Physician Discharge Summary  Patient ID: William Mcguire MRN: [id number] DOB/AGE: 1956/04/17 56 y.o.  Admit date: 06/11/2011 Discharge date: 06/29/2011  Admission Diagnoses:  Right patella tendon rupture  Discharge Diagnoses:  Same  Surgeries: Procedure(s): PATELLA TENDON REPAIR on 06/11/2011   Consultants:    Discharged Condition: Stable  Hospital Course: William Mcguire is an 56 y.o. male who was admitted 06/11/2011 with a chief complaint of knee pain and instability Chief Complaint  Patient presents with  . Dislocation  . Knee Injury  , and found to have a diagnosis of right patella tendon rupture They were brought to the operating room on 06/11/2011 and underwent the above named procedures.    Antibiotics given:  Anti-infectives     Start     Dose/Rate Route Frequency Ordered Stop   06/12/11 0130   ceFAZolin (ANCEF) IVPB 1 g/50 mL premix        1 g 100 mL/hr over 30 Minutes Intravenous 3 times per day 06/11/11 2112 06/12/11 1431        .  Recent vital signs:  Filed Vitals:   06/13/11 0550  BP: 162/92  Pulse: 81  Temp: 100.2 F (37.9 C)  Resp: 20    Recent laboratory studies:  Results for orders placed during the hospital encounter of 06/11/11  CBC      Component Value Range   WBC 7.2  4.0 - 10.5 (K/uL)   RBC 4.66  4.22 - 5.81 (MIL/uL)   Hemoglobin 12.7 (*) 13.0 - 17.0 (g/dL)   HCT 40.9 (*) 81.1 - 52.0 (%)   MCV 81.3  78.0 - 100.0 (fL)   MCH 27.3  26.0 - 34.0 (pg)   MCHC 33.5  30.0 - 36.0 (g/dL)   RDW 91.4  78.2 - 95.6 (%)   Platelets 236  150 - 400 (K/uL)  DIFFERENTIAL      Component Value Range   Neutrophils Relative 79 (*) 43 - 77 (%)   Neutro Abs 5.7  1.7 - 7.7 (K/uL)   Lymphocytes Relative 14  12 - 46 (%)   Lymphs Abs 1.0  0.7 - 4.0 (K/uL)   Monocytes Relative 6  3 - 12 (%)   Monocytes Absolute 0.4  0.1 - 1.0 (K/uL)   Eosinophils Relative 1  0 - 5 (%)   Eosinophils Absolute 0.0  0.0 - 0.7 (K/uL)   Basophils Relative 0  0 - 1 (%)   Basophils Absolute 0.0  0.0 - 0.1 (K/uL)  COMPREHENSIVE METABOLIC PANEL      Component Value Range   Sodium 136  135 - 145 (mEq/L)   Potassium 4.3  3.5 - 5.1 (mEq/L)   Chloride 102  96 - 112 (mEq/L)   CO2 28  19 - 32 (mEq/L)   Glucose, Bld 196 (*) 70 - 99 (mg/dL)   BUN 13  6 - 23 (mg/dL)   Creatinine, Ser 2.13  0.50 - 1.35 (mg/dL)   Calcium 9.9  8.4 - 08.6 (mg/dL)   Total Protein 7.6  6.0 - 8.3 (g/dL)   Albumin 3.8  3.5 - 5.2 (g/dL)   AST 22  0 - 37 (U/L)   ALT 23  0 - 53 (U/L)   Alkaline Phosphatase 106  39 - 117 (U/L)   Total Bilirubin 0.3  0.3 - 1.2 (mg/dL)   GFR calc non Af Amer 71 (*) >90 (mL/min)   GFR calc Af Amer 82 (*) >90 (mL/min)  PROTIME-INR      Component Value Range  Prothrombin Time 13.9  11.6 - 15.2 (seconds)   INR 1.05  0.00 - 1.49   APTT      Component Value Range   aPTT 30  24 - 37 (seconds)  GLUCOSE, CAPILLARY      Component Value Range   Glucose-Capillary 171 (*) 70 - 99 (mg/dL)  GLUCOSE, CAPILLARY      Component Value Range   Glucose-Capillary 94  70 - 99 (mg/dL)   Comment 1 Notify RN     Comment 2 Documented in Chart    PROTIME-INR      Component Value Range   Prothrombin Time 14.0  11.6 - 15.2 (seconds)   INR 1.06  0.00 - 1.49   PROTIME-INR      Component Value Range   Prothrombin Time 16.6 (*) 11.6 - 15.2 (seconds)   INR 1.32  0.00 - 1.49     Discharge Medications:   Discharge Medication List as of 06/13/2011  5:00 PM    START taking these medications   Details  methocarbamol (ROBAXIN) 500 MG tablet Take 1 tablet (500 mg total) by mouth every 6 (six) hours as needed., Starting 06/13/2011, Until Mon 06/23/11, Print    oxyCODONE (OXY IR/ROXICODONE) 5 MG immediate release tablet Take 1-2 tablets (5-10 mg total) by mouth every 3 (three) hours as needed., Starting 06/13/2011, Until Mon 06/23/11, Print    warfarin (COUMADIN) 5 MG tablet Take 1 tablet (5 mg total) by mouth daily., Starting 06/13/2011, Until Mon 07/14/11, Print      CONTINUE  these medications which have NOT CHANGED   Details  amlodipine-atorvastatin (CADUET) 10-20 MG per tablet Take 1 tablet by mouth daily.  , Until Discontinued, Historical Med    Liraglutide (VICTOZA [place]) Inject 1.8 mg into the skin at bedtime. , Until Discontinued, Historical Med    metFORMIN (GLUCOPHAGE) 1000 MG tablet Take 1,000 mg by mouth 2 (two) times daily with a meal.  , Until Discontinued, Historical Med    pioglitazone (ACTOS) 15 MG tablet Take 15 mg by mouth daily.  , Until Discontinued, Historical Med        Diagnostic Studies: Dg Chest 2 View  06/20/2011  *RADIOLOGY REPORT*  Clinical Data: Fever.  Recent knee surgery.  Possible infection. Preop for repeat surgery.  CHEST - 2 VIEW  Comparison: Two-view chest 06/11/2011.  Findings: The heart size is normal.  The lungs are clear.  The visualized soft tissues and bony thorax are unremarkable.  IMPRESSION:  1.  No acute cardiopulmonary disease or significant interval change.  Original Report Authenticated By: Jamesetta Orleans. MATTERN, M.D.   Dg Chest 2 View  06/11/2011  *RADIOLOGY REPORT*  Clinical Data: Preoperative respiratory films.  Patient for knee surgery.  CHEST - 2 VIEW  Comparison: None.  Findings: Lungs are clear.  Heart size is normal.  No pneumothorax or pleural effusion.  IMPRESSION: Negative chest.  Original Report Authenticated By: Bernadene Bell. Maricela Curet, M.D.   US Renal  06/21/2011  *RADIOLOGY REPORT*  Clinical Data: Pyelonephritis.  Elevated creatinine.  RENAL/URINARY TRACT ULTRASOUND COMPLETE  Comparison:  Findings:  Right Kidney:  Normal.  11.3 cm in length.  Left Kidney:  10.6 cm in length.  Possible tiny stone in the midportion of the kidney.  Renal cortex is normal.  No obstruction or abscess.  Bladder:  Normal.  IMPRESSION: Possible tiny stone in the mid left kidney.  Otherwise normal appearing kidneys with no abscess or obstruction or perinephric fluid.  Original Report Authenticated By: Allayne Gitelman.  Jena Gauss, M.D.   Dg  Knee Complete 4 Views Right  06/20/2011  *RADIOLOGY REPORT*  Clinical Data: Right knee pain (right knee surgery 12/19)  RIGHT KNEE - COMPLETE 4+ VIEW  Comparison: 06/11/2011  Findings:  No fracture.  Previously noted patella alta is improved in the interval compatible with provided history of interval operative repair of previously identified patellar tendon rupture.  There is nonspecific extensive soft tissue swelling about the anterior aspect of the knee. The subcutaneous emphysema or definite evidence of osteolysis.  No radiopaque foreign body.  Grossly unchanged mild to moderate tricompartmental degenerative change with joint space loss, subchondral sclerosis and osteophytosis. Enthesopathic change of the superior and inferior poles of the patella.  Spurring the tibial spines.  No evidence of chondrocalcinosis.  IMPRESSION: 1.  Improved patellar alta, compatible with history of operative repair, though note the integrity of the patellar tendon is not well evaluated on this examination. 2.  Nonspecific soft tissue swelling about the anterior aspect of the knee, possibly postsurgical in etiology.  3.  Unchanged mild to moderate tricompartmental degenerative change.  Original Report Authenticated By: Waynard Reeds, M.D.   Dg Knee Complete 4 Views Right  06/11/2011  *RADIOLOGY REPORT*  Clinical Data: Twisting injury.  Pain.  RIGHT KNEE - COMPLETE 4+ VIEW  Comparison: None.  Findings: There is patella alta with a complete tear of the patellar tendon off the inferior pole of the patella.  No fracture is identified.  Tricompartmental osteoarthritis is noted.  IMPRESSION:  1.  Complete tear of the patellar tendon off the inferior pole the patella. 2.  Negative for fracture. 3.  Osteoarthritis.  Original Report Authenticated By: Bernadene Bell. Maricela Curet, M.D.    Disposition: Home or Self Care  Discharge Orders    Future Orders Please Complete By Expires   Diet - low sodium heart healthy      Call MD / Call 911       Comments:   If you experience chest pain or shortness of breath, CALL 911 and be transported to the hospital emergency room.  If you develope a fever above 101 F, pus (white drainage) or increased drainage or redness at the wound, or calf pain, call your surgeon's office.   Constipation Prevention      Comments:   Drink plenty of fluids.  Prune juice may be helpful.  You may use a stool softener, such as Colace (over the counter) 100 mg twice a day.  Use MiraLax (over the counter) for constipation as needed.   Increase activity slowly as tolerated      Weight Bearing as taught in Physical Therapy      Comments:   Use a walker or crutches as instructed.   Discharge instructions      Comments:   Weight bearing as tolerated in brace Keep incision dry Call [phone] for f/u in 10 days         Signed: Cammy Copa 06/29/2011, 8:53 PM

## 2011-06-30 ENCOUNTER — Telehealth: Payer: Self-pay | Admitting: *Deleted

## 2011-06-30 LAB — CULTURE, URINE COMPREHENSIVE: Colony Count: >=100000

## 2011-06-30 NOTE — Telephone Encounter (Signed)
Results are only "preliminary" at present.  Dr. Ninetta Lights has not reviewed them.  Pt's home # is [phone] and wife's number is [phone].   Wife would appreciate a call when results have been reviewed.

## 2011-07-01 ENCOUNTER — Telehealth: Payer: Self-pay | Admitting: Licensed Clinical Social Worker

## 2011-07-01 NOTE — Telephone Encounter (Signed)
Patient called wanting to discuss test results with Dr. Ninetta Lights. Patient states Dr. Ninetta Lights spoke with his wife but he wanted to talk to Dr. Ninetta Lights.

## 2011-07-02 ENCOUNTER — Telehealth: Payer: Self-pay | Admitting: *Deleted

## 2011-07-02 ENCOUNTER — Telehealth: Payer: Self-pay | Admitting: Infectious Diseases

## 2011-07-02 NOTE — Telephone Encounter (Signed)
Pt would appreciate a call from Dr. Ninetta Lights to discuss results of Urine culture.

## 2011-07-02 NOTE — Telephone Encounter (Signed)
I called [name] back regarding his urine culture. The results showed significant white blood cells on his UA as well  as his urine culture again showing Escherichia coli. It again had a significant amount of her resistance noted on sensitivity testing. I spoke with him that it is possible that he could develop symptoms from having this, but is more likely that he is colonized. He will call me back immediately if he has any symptoms suggestive of an acute urinary tract infection: Fever, chills, dysuria, or cloudiness in his urine. He agrees to this. He has questions regarding whether or not this is transmissible through intimate relations with his wife. I told him that this is not typically the case

## 2013-09-02 ENCOUNTER — Ambulatory Visit: Payer: 59 | Admitting: Physical Therapy

## 2013-09-11 ENCOUNTER — Encounter (HOSPITAL_COMMUNITY): Payer: Self-pay | Admitting: Emergency Medicine

## 2013-09-11 ENCOUNTER — Emergency Department (HOSPITAL_COMMUNITY)
Admission: EM | Admit: 2013-09-11 | Discharge: 2013-09-11 | Disposition: A | Discharge status: Home / Self Care | Payer: 59 | Attending: Emergency Medicine | Admitting: Emergency Medicine

## 2013-09-11 DIAGNOSIS — N492 Inflammatory disorders of scrotum: Secondary | ICD-10-CM

## 2013-09-11 DIAGNOSIS — N498 Inflammatory disorders of other specified male genital organs: Secondary | ICD-10-CM | POA: Insufficient documentation

## 2013-09-11 DIAGNOSIS — Z79899 Other long term (current) drug therapy: Secondary | ICD-10-CM | POA: Insufficient documentation

## 2013-09-11 DIAGNOSIS — E119 Type 2 diabetes mellitus without complications: Secondary | ICD-10-CM | POA: Insufficient documentation

## 2013-09-11 DIAGNOSIS — Z7982 Long term (current) use of aspirin: Secondary | ICD-10-CM | POA: Insufficient documentation

## 2013-09-11 DIAGNOSIS — I10 Essential (primary) hypertension: Secondary | ICD-10-CM | POA: Insufficient documentation

## 2013-09-11 MED ORDER — DOXYCYCLINE HYCLATE 100 MG PO TABS
100.0000 mg | ORAL_TABLET | Freq: Once | ORAL | Status: AC
  Administered 2013-09-11: 100 mg via ORAL
  Filled 2013-09-11: qty 1

## 2013-09-11 MED ORDER — DOXYCYCLINE HYCLATE 100 MG PO CAPS: 100.0000 mg | ORAL_CAPSULE | Freq: Two times a day (BID) | ORAL | Status: DC

## 2013-09-11 NOTE — ED Notes (Addendum)
Pt reports having scrotal pain since last Tuesday. Pt reports having abscess on scrotum that is draining pus.Denies any difficulty urinating.

## 2013-09-11 NOTE — Discharge Instructions (Signed)

## 2013-09-11 NOTE — ED Provider Notes (Signed)
CSN: [id number]     Arrival date & time 09/11/13  1812 History   First MD Initiated Contact with Patient 09/11/13 1952     Chief Complaint  Patient presents with  . Testicle Pain     (Consider location/radiation/quality/duration/timing/severity/associated sxs/prior Treatment) HPI Comments: Patient is a 58 year old male with history of diabetes and hypertension who presents today with pain and drainage coming from his right scrotum. This began yesterday and has been gradually worsening since that time. It is a burning, aching pain worse with palpation. He has been trying to keep the area clean, but no warm soaks. This has never happened to him in the past. Yesterday the area began draining a purulent, malodorous drainage. No fevers, chills, nausea, vomiting, abdominal pain, dysuria, pain with bowel movements.   The history is provided by the patient. No language interpreter was used.    Past Medical History  Diagnosis Date  . Diabetes mellitus   . Hypertension    Past Surgical History  Procedure Laterality Date  . Knee surgery      torn cartilage  . Patellar tendon repair  06/11/2011    Procedure: PATELLA TENDON REPAIR;  Surgeon: Meredith Pel;  Location: [facility];  Service: Orthopedics;  Laterality: Right;   Family History  Problem Relation Age of Onset  . Hypertension Mother   . Multiple myeloma Mother   . Hypertension Father   . Diabetes Father    History  Substance Use Topics  . Smoking status: Never Smoker   . Smokeless tobacco: Never Used  . Alcohol Use: No    Review of Systems  Constitutional: Negative for fever and chills.  Respiratory: Negative for shortness of breath.   Cardiovascular: Negative for chest pain.  Gastrointestinal: Negative for nausea, vomiting and abdominal pain.  Genitourinary: Negative for dysuria, urgency, discharge, penile swelling and penile pain.  Skin: Positive for wound.  All other systems reviewed and are  negative.      Allergies  Review of patient's allergies indicates no known allergies.  Home Medications   Current Outpatient Rx  Name  Route  Sig  Dispense  Refill  . amlodipine-atorvastatin (CADUET) 10-20 MG per tablet   Oral   Take 1 tablet by mouth daily.          [name] aspirin EC 81 MG tablet   Oral   Take 81 mg by mouth daily.           . enalapril (VASOTEC) 5 MG tablet   Oral   Take 5 mg by mouth 2 (two) times daily.           . Liraglutide (VICTOZA [place])   Subcutaneous   Inject 1.8 mg into the skin at bedtime.          . metFORMIN (GLUCOPHAGE) 1000 MG tablet   Oral   Take 1,000 mg by mouth 2 (two) times daily with a meal.          . pioglitazone (ACTOS) 15 MG tablet   Oral   Take 15 mg by mouth daily.           BP 149/83  Pulse 73  Temp(Src) 98 F (36.7 C) (Oral)  Resp 20  SpO2 99% Physical Exam  Nursing note and vitals reviewed. Constitutional: He is oriented to person, place, and time. He appears well-developed and well-nourished. No distress.  HENT:  Head: Normocephalic and atraumatic.  Right Ear: External ear normal.  Left Ear: External ear normal.  Nose: Nose normal.  Eyes: Conjunctivae are normal.  Neck: Normal range of motion. No tracheal deviation present.  Cardiovascular: Normal rate, regular rhythm and normal heart sounds.   Pulmonary/Chest: Effort normal and breath sounds normal. No stridor.  Abdominal: Soft. He exhibits no distension. There is no tenderness.  Genitourinary: Circumcised.  Draining superficial abscess on right distal scrotum with purulent foul smelling discharge. Induration felt laterally.  No testicular pain. No penile pain or discharge.   Musculoskeletal: Normal range of motion.  Neurological: He is alert and oriented to person, place, and time.  Skin: Skin is warm and dry. He is not diaphoretic.  Psychiatric: He has a normal mood and affect. His behavior is normal.    ED Course  Procedures (including  critical care time) Labs Review Labs Reviewed - No data to display Imaging Review No results found.   EKG Interpretation None      INCISION AND DRAINAGE Performed by: Cleatrice Burke Consent: Verbal consent obtained. Risks and benefits: risks, benefits and alternatives were discussed Type: abscess  Body area: right scrotum  Anesthesia: local infiltration  Incision was made with a scalpel.  Local anesthetic: lidocaine 2%  Anesthetic total: 3 ml  Complexity: complex Blunt dissection to break up loculations  Drainage: purulent  Drainage amount: copious  Patient tolerance: Patient tolerated the procedure well with no immediate complications.     MDM   Final diagnoses:  Scrotal abscess    Patient with skin abscess amenable to incision and drainage.  Abscess was not large enough to warrant packing or drain,  wound recheck in 2 days. Encouraged home warm soaks and flushing.  Mild signs of cellulitis is surrounding skin.  Will prescribe doxycycline and given first dose here. Will d/c to home. Dr. Ashok Cordia evaluated patient who agrees with plan.      Elwyn Lade, PA-C 09/11/13 2308

## 2013-09-13 NOTE — ED Provider Notes (Signed)
Medical screening examination/treatment/procedure(s) were conducted as a shared visit with non-physician practitioner(s) and myself.  I personally evaluated the patient during the encounter.   EKG Interpretation None      Pt c/o 1 week swollen sore area base of scrotum, now draining small amt purulent material. Pt w abscess. No crepitus. Will I and D.   Mirna Mires, MD 09/13/13 [phone]

## 2015-09-21 ENCOUNTER — Other Ambulatory Visit: Payer: Self-pay | Admitting: Orthopaedic Surgery

## 2015-09-21 DIAGNOSIS — M25512 Pain in left shoulder: Secondary | ICD-10-CM

## 2015-09-26 ENCOUNTER — Ambulatory Visit
Admission: RE | Admit: 2015-09-26 | Discharge: 2015-09-26 | Disposition: A | Discharge status: Home / Self Care | Payer: BC Managed Care – PPO | Source: Ambulatory Visit | Attending: Orthopaedic Surgery | Admitting: Orthopaedic Surgery

## 2015-09-26 DIAGNOSIS — M25512 Pain in left shoulder: Secondary | ICD-10-CM

## 2016-02-27 LAB — HM COLONOSCOPY

## 2017-06-19 ENCOUNTER — Telehealth (INDEPENDENT_AMBULATORY_CARE_PROVIDER_SITE_OTHER): Payer: Self-pay | Admitting: Orthopedic Surgery

## 2017-06-19 NOTE — Telephone Encounter (Signed)
I tried calling patient back. No answer LMVM for him advising that unfortunately did not have a sooner appointment as Dr Marlou Sa is out of the office until 01/07.  I did offer on VM for him to make an appt with a different provider with the first available until he can get in to see Dr Marlou Sa.

## 2017-06-19 NOTE — Telephone Encounter (Signed)
Patient had surgery with Dr. Marlou Sa 5 years ago, his knee is currently very swollen and painful. He has an appointment on the 11th and was wondering if he could possibly be seen any sooner. CB # [phone]

## 2017-07-06 ENCOUNTER — Encounter (INDEPENDENT_AMBULATORY_CARE_PROVIDER_SITE_OTHER): Payer: Self-pay | Admitting: Orthopedic Surgery

## 2017-07-06 ENCOUNTER — Ambulatory Visit (INDEPENDENT_AMBULATORY_CARE_PROVIDER_SITE_OTHER): Payer: BC Managed Care – PPO | Admitting: Orthopedic Surgery

## 2017-07-06 ENCOUNTER — Ambulatory Visit (INDEPENDENT_AMBULATORY_CARE_PROVIDER_SITE_OTHER): Payer: BC Managed Care – PPO

## 2017-07-06 DIAGNOSIS — G8929 Other chronic pain: Secondary | ICD-10-CM

## 2017-07-06 DIAGNOSIS — M25561 Pain in right knee: Secondary | ICD-10-CM | POA: Diagnosis not present

## 2017-07-06 DIAGNOSIS — M1711 Unilateral primary osteoarthritis, right knee: Secondary | ICD-10-CM

## 2017-07-06 MED ORDER — METHYLPREDNISOLONE ACETATE 40 MG/ML IJ SUSP
40.0000 mg | INTRAMUSCULAR | Status: AC | PRN
  Administered 2017-07-06: 40 mg via INTRA_ARTICULAR

## 2017-07-06 MED ORDER — LIDOCAINE HCL 1 % IJ SOLN
5.0000 mL | INTRAMUSCULAR | Status: AC | PRN
  Administered 2017-07-06: 5 mL

## 2017-07-06 MED ORDER — BUPIVACAINE HCL 0.25 % IJ SOLN
4.0000 mL | INTRAMUSCULAR | Status: AC | PRN
  Administered 2017-07-06: 4 mL via INTRA_ARTICULAR

## 2017-07-06 NOTE — Progress Notes (Signed)
Office Visit Note   Patient: William Mcguire           Date of Birth: May 11, 1956           MRN: [id number] Visit Date: 07/06/2017 Requested by: No referring provider defined for this encounter. PCP: [name], MD  Subjective: Chief Complaint  Patient presents with  . Right Knee - Pain    HPI: William Mcguire is a 62 year old patient with right knee pain.  4 weeks ago he was doing some wiring in his attic for his mother-in-law and he developed pain.  He has a history of right knee patellar tendon repair done 6 years ago.  Since I have seen him he has had surgery on his shoulder.  He takes aspirin as needed for his pain.  He states that his knee has been swollen for a few weeks.  Does hurt him to stand and walk for a prolonged period of time.  Denies any discrete mechanical symptoms.              ROS: All systems reviewed are negative as they relate to the chief complaint within the history of present illness.  Patient denies  fevers or chills.   Assessment & Plan: Visit Diagnoses:  1. Chronic pain of right knee   2. Unilateral primary osteoarthritis, right knee     Plan: Impression is right knee pain with effusion and medial joint line narrowing.  May have exacerbation of arthritis versus degenerative meniscal tearing.  Aspiration and injection is performed today.  We will see him back in 6 weeks.  35 cc clear fluid aspirated.  Decide then for or against further imaging.  Follow-Up Instructions: Return in about 6 weeks (around 08/17/2017).   Orders:  Orders Placed This Encounter  Procedures  . XR KNEE 3 VIEW RIGHT   No orders of the defined types were placed in this encounter.     Procedures: Large Joint Inj: R knee on 07/06/2017 5:21 PM Indications: diagnostic evaluation, joint swelling and pain Details: 18 G 1.5 in needle, superolateral approach  Arthrogram: No  Medications: 5 mL lidocaine 1 %; 40 mg methylPREDNISolone acetate 40 MG/ML; 4 mL bupivacaine 0.25 % Aspirate: 35  mL yellow Outcome: tolerated well, no immediate complications Procedure, treatment alternatives, risks and benefits explained, specific risks discussed. Consent was given by the patient. Immediately prior to procedure a time out was called to verify the correct patient, procedure, equipment, support staff and site/side marked as required. Patient was prepped and draped in the usual sterile fashion.       Clinical Data: No additional findings.  Objective: Vital Signs: There were no vitals taken for this visit.  Physical Exam:   Constitutional: Patient appears well-developed HEENT:  Head: Normocephalic Eyes:EOM are normal Neck: Normal range of motion Cardiovascular: Normal rate Pulmonary/chest: Effort normal Neurologic: Patient is alert Skin: Skin is warm Psychiatric: Patient has normal mood and affect  Orthopedic exam demonstrates intact extensor mechanism on the right.  Ortho Exam: No flexion contracture on the right.  Well-healed surgical incision noted.  Patella tendon is palpable and nontender.  Most of his pain does tend to be in the periretinacular anterior knee area.  Collateral and cruciate ligaments are stable.  Pedal pulses palpable.  No other masses lymphadenopathy or skin changes noted in the right knee region  Specialty Comments:  No specialty comments available.  Imaging: Xr Knee 3 View Right  Result Date: 07/06/2017 AP lateral merchant right knee reviewed.  Medial joint space  narrowing is present with some spurring noted.  Joint space is maintained.  Inferior patellar bony abnormality is present with some patellofemoral arthritis.  There is no fracture or dislocation.  No effusion.  Patellofemoral joint space is otherwise symmetric.    PMFS History: Patient Active Problem List   Diagnosis Date Noted  . Pyelonephritis 06/23/2011  . AKI (acute kidney injury) ([place]) 06/23/2011  . Diabetes mellitus 06/23/2011  . HTN (hypertension) 06/23/2011   Past Medical  History:  Diagnosis Date  . Diabetes mellitus   . Hypertension     Family History  Problem Relation Age of Onset  . Hypertension Mother   . Multiple myeloma Mother   . Hypertension Father   . Diabetes Father     Past Surgical History:  Procedure Laterality Date  . KNEE SURGERY     torn cartilage  . PATELLAR TENDON REPAIR  06/11/2011   Procedure: PATELLA TENDON REPAIR;  Surgeon: Meredith Pel;  Location: [facility];  Service: Orthopedics;  Laterality: Right;   Social History   Occupational History  . Not on file  Tobacco Use  . Smoking status: Never Smoker  . Smokeless tobacco: Never Used  Substance and Sexual Activity  . Alcohol use: No  . Drug use: No  . Sexual activity: Not on file

## 2018-05-05 ENCOUNTER — Encounter: Payer: Self-pay | Admitting: Internal Medicine

## 2018-05-05 ENCOUNTER — Ambulatory Visit: Payer: BC Managed Care – PPO | Admitting: Internal Medicine

## 2018-05-05 VITALS — BP 154/96 | HR 71 | Temp 97.9°F | Ht 75.0 in | Wt 261.0 lb

## 2018-05-05 DIAGNOSIS — E1122 Type 2 diabetes mellitus with diabetic chronic kidney disease: Secondary | ICD-10-CM | POA: Diagnosis not present

## 2018-05-05 DIAGNOSIS — I129 Hypertensive chronic kidney disease with stage 1 through stage 4 chronic kidney disease, or unspecified chronic kidney disease: Secondary | ICD-10-CM

## 2018-05-05 DIAGNOSIS — Z794 Long term (current) use of insulin: Secondary | ICD-10-CM

## 2018-05-05 DIAGNOSIS — N182 Chronic kidney disease, stage 2 (mild): Secondary | ICD-10-CM

## 2018-05-05 DIAGNOSIS — E6609 Other obesity due to excess calories: Secondary | ICD-10-CM

## 2018-05-05 DIAGNOSIS — Z23 Encounter for immunization: Secondary | ICD-10-CM

## 2018-05-05 DIAGNOSIS — Z7982 Long term (current) use of aspirin: Secondary | ICD-10-CM

## 2018-05-05 DIAGNOSIS — Z6832 Body mass index (BMI) 32.0-32.9, adult: Secondary | ICD-10-CM

## 2018-05-05 NOTE — Patient Instructions (Signed)

## 2018-05-06 LAB — BMP8+EGFR
BUN / CREAT RATIO: 10 (ref 10–24)
BUN: 12 mg/dL (ref 8–27)
CALCIUM: 9.9 mg/dL (ref 8.6–10.2)
CO2: 24 mmol/L (ref 20–29)
CREATININE: 1.25 mg/dL (ref 0.76–1.27)
Chloride: 100 mmol/L (ref 96–106)
GFR, EST AFRICAN AMERICAN: 70 mL/min/{1.73_m2} (ref >59)
GFR, EST NON AFRICAN AMERICAN: 60 mL/min/{1.73_m2} (ref >59)
Glucose: 105 mg/dL — ABNORMAL HIGH (ref 65–99)
Potassium: 4.2 mmol/L (ref 3.5–5.2)
Sodium: 139 mmol/L (ref 134–144)

## 2018-05-06 LAB — HEMOGLOBIN A1C
Est. average glucose Bld gHb Est-mCnc: 163 mg/dL
Hgb A1c MFr Bld: 7.3 % — ABNORMAL HIGH (ref 4.8–5.6)

## 2018-05-06 NOTE — Progress Notes (Signed)
Here are your lab results:  Your kidney function is stable. Your a1c has improved, down to 7.3. Congrats! Keep up the great work! Please continue with current meds.   Sincerely,    Meghan Warshawsky N. [facility], MD

## 2018-05-09 ENCOUNTER — Encounter: Payer: Self-pay | Admitting: Internal Medicine

## 2018-05-09 NOTE — Progress Notes (Signed)
Subjective:     Patient ID: William Mcguire , male    DOB: May 03, 1956 , 62 y.o.   MRN: [id number]   Chief Complaint  Patient presents with  . Diabetes  . Hypertension    HPI  Diabetes  He presents for his follow-up diabetic visit. He has type 2 diabetes mellitus. His disease course has been stable. There are no hypoglycemic associated symptoms. There are no diabetic associated symptoms. There are no hypoglycemic complications. Diabetic complications include nephropathy. Risk factors for coronary artery disease include dyslipidemia, diabetes mellitus, male sex and hypertension. He is compliant with treatment most of the time.  Hypertension  This is a chronic problem. The current episode started more than 1 year ago. The problem is controlled. The current treatment provides moderate improvement. Hypertensive end-organ damage includes kidney disease.  REPORTS COMPLIANCE WITH MEDS; HOWEVER, HAS YET TO TAKE MEDS TODAY.   Past Medical History:  Diagnosis Date  . Diabetes mellitus   . Hypertension      Family History  Problem Relation Age of Onset  . Hypertension Mother   . Multiple myeloma Mother   . Hypertension Father   . Diabetes Father      Current Outpatient Medications:  .  amlodipine-atorvastatin (CADUET) 10-20 MG per tablet, Take 1 tablet by mouth daily. , Disp: , Rfl:  .  aspirin EC 81 MG tablet, Take 81 mg by mouth daily.  , Disp: , Rfl:  .  B-D ULTRAFINE III SHORT PEN 31G X 8 MM MISC, See admin instructions., Disp: , Rfl: 99 .  Cyanocobalamin (VITAMIN B-12 IJ), Inject as directed., Disp: , Rfl:  .  metFORMIN (GLUCOPHAGE) 1000 MG tablet, Take 1,000 mg by mouth 2 (two) times daily with a meal. , Disp: , Rfl:  .  pioglitazone (ACTOS) 15 MG tablet, Take by mouth., Disp: , Rfl:  .  sildenafil (VIAGRA) 100 MG tablet, Take 100 mg by mouth daily as needed., Disp: , Rfl: 5 .  XULTOPHY 100-3.6 UNIT-MG/ML SOPN, INJECT 20 UNITS SUBCUTANEOUSLY EVERY NIGHT, Disp: , Rfl: 5   No  Known Allergies   Review of Systems  Constitutional: Negative.   Respiratory: Negative.   Cardiovascular: Negative.   Gastrointestinal: Negative.   Neurological: Negative.   Psychiatric/Behavioral: Negative.      Today's Vitals   05/05/18 1013  BP: (!) 154/96  Pulse: 71  Temp: 97.9 F (36.6 C)  TempSrc: Oral  Weight: 261 lb (118.4 kg)  Height: 6' 3"  (1.905 m)  PainSc: 0-No pain   Body mass index is 32.62 kg/m.   Objective:  Physical Exam  Constitutional: He is oriented to person, place, and time. He appears well-developed and well-nourished.  HENT:  Head: Normocephalic and atraumatic.  Cardiovascular: Normal rate, regular rhythm and normal heart sounds.  Pulmonary/Chest: Effort normal.  Neurological: He is alert and oriented to person, place, and time.  Psychiatric: He has a normal mood and affect.  Nursing note and vitals reviewed.       Assessment And Plan:     1. Type 2 diabetes mellitus with stage 2 chronic kidney disease, with long-term current use of insulin (HCC)  I WILL CHECK LABS AS LISTED BELOW. I WILL ADJUST MEDS AS NEEDED AFTER REVIEWING LAB RESULTS.   - BMP8+EGFR - Hemoglobin A1c  2. Chronic renal disease, stage II  CHRONIC, I WILL CHECK A GFR, CR TODAY.   3. Hypertensive nephropathy  UNCONTROLLED. HE IS ENCOURAGED TO AVOID ADDING SALT TO HIS FOODS. IMPORTANCE OF REGULAR EXERCISE  WAS DISCUSSED WITH THE PATIENT.   4. Need for vaccination  - Flu Vaccine QUAD 6+ mos PF IM (Fluarix Quad PF)  5. Class 1 obesity due to excess calories with serious comorbidity and body mass index (BMI) of 32.0 to 32.9 in adult  HE IS ENCOURAGED TO STRIVE FOR BMI LESS THAN 29 TO DECREASE CARDIAC RISK. HE IS ENCOURAGED TO AVOID REFINED CARBS AND SUGARY BEVERAGES.   [name], MD

## 2018-07-02 ENCOUNTER — Other Ambulatory Visit: Payer: Self-pay | Admitting: Internal Medicine

## 2018-07-02 ENCOUNTER — Telehealth: Payer: Self-pay

## 2018-07-02 NOTE — Telephone Encounter (Signed)
error 

## 2018-07-05 ENCOUNTER — Other Ambulatory Visit: Payer: Self-pay

## 2018-07-05 MED ORDER — "SYRINGE/NEEDLE (DISP) 25G X 5/8"" 3 ML MISC": 2 refills | Status: DC

## 2018-07-23 ENCOUNTER — Other Ambulatory Visit: Payer: Self-pay | Admitting: Internal Medicine

## 2018-08-03 ENCOUNTER — Other Ambulatory Visit: Payer: Self-pay | Admitting: Internal Medicine

## 2018-08-05 ENCOUNTER — Encounter: Payer: Self-pay | Admitting: Internal Medicine

## 2018-08-05 ENCOUNTER — Ambulatory Visit: Payer: BC Managed Care – PPO | Admitting: Internal Medicine

## 2018-08-05 VITALS — BP 126/88 | HR 77 | Temp 98.1°F | Ht 75.0 in | Wt 260.6 lb

## 2018-08-05 DIAGNOSIS — Z6832 Body mass index (BMI) 32.0-32.9, adult: Secondary | ICD-10-CM

## 2018-08-05 DIAGNOSIS — I129 Hypertensive chronic kidney disease with stage 1 through stage 4 chronic kidney disease, or unspecified chronic kidney disease: Secondary | ICD-10-CM

## 2018-08-05 DIAGNOSIS — E6609 Other obesity due to excess calories: Secondary | ICD-10-CM

## 2018-08-05 DIAGNOSIS — E1122 Type 2 diabetes mellitus with diabetic chronic kidney disease: Secondary | ICD-10-CM | POA: Diagnosis not present

## 2018-08-05 DIAGNOSIS — N182 Chronic kidney disease, stage 2 (mild): Secondary | ICD-10-CM

## 2018-08-05 DIAGNOSIS — Z1211 Encounter for screening for malignant neoplasm of colon: Secondary | ICD-10-CM

## 2018-08-06 LAB — BMP8+EGFR
BUN / CREAT RATIO: 13 (ref 10–24)
BUN: 14 mg/dL (ref 8–27)
CHLORIDE: 104 mmol/L (ref 96–106)
CO2: 24 mmol/L (ref 20–29)
CREATININE: 1.08 mg/dL (ref 0.76–1.27)
Calcium: 9.7 mg/dL (ref 8.6–10.2)
GFR calc non Af Amer: 72 mL/min/{1.73_m2} (ref >59)
GFR, EST AFRICAN AMERICAN: 83 mL/min/{1.73_m2} (ref >59)
Glucose: 95 mg/dL (ref 65–99)
Potassium: 4.7 mmol/L (ref 3.5–5.2)
Sodium: 142 mmol/L (ref 134–144)

## 2018-08-06 LAB — HEMOGLOBIN A1C
Est. average glucose Bld gHb Est-mCnc: 192 mg/dL
Hgb A1c MFr Bld: 8.3 % — ABNORMAL HIGH (ref 4.8–5.6)

## 2018-08-06 NOTE — Progress Notes (Signed)
Subjective:     Patient ID: William Mcguire , male    DOB: [birth year] , 63 y.o.   MRN: [id number]   Chief Complaint  Patient presents with  . Diabetes  . Hypertension    HPI  Diabetes  He presents for his follow-up diabetic visit. He has type 2 diabetes mellitus. His disease course has been stable. There are no hypoglycemic associated symptoms. Pertinent negatives for diabetes include no blurred vision and no chest pain. There are no hypoglycemic complications. Diabetic complications include nephropathy. Risk factors for coronary artery disease include diabetes mellitus, dyslipidemia, hypertension, male sex and sedentary lifestyle. His breakfast blood glucose is taken between 8-9 am. His breakfast blood glucose range is generally 90-110 mg/dl.  Hypertension  This is a chronic problem. The current episode started more than 1 year ago. The problem has been gradually improving since onset. The problem is controlled. Pertinent negatives include no blurred vision, chest pain, palpitations or shortness of breath.   He reports compliance with meds.   Past Medical History:  Diagnosis Date  . Diabetes mellitus   . Hypertension      Family History  Problem Relation Age of Onset  . Hypertension Mother   . Multiple myeloma Mother   . Hypertension Father   . Diabetes Father      Current Outpatient Medications:  .  amlodipine-atorvastatin (CADUET) 10-20 MG per tablet, Take 1 tablet by mouth daily. , Disp: , Rfl:  .  aspirin EC 81 MG tablet, Take 81 mg by mouth daily.  , Disp: , Rfl:  .  B-D ULTRAFINE III SHORT PEN 31G X 8 MM MISC, See admin instructions., Disp: , Rfl: 99 .  Cyanocobalamin (VITAMIN B-12 IJ), Inject as directed., Disp: , Rfl:  .  metFORMIN (GLUCOPHAGE) 1000 MG tablet, Take 1,000 mg by mouth 2 (two) times daily with a meal. , Disp: , Rfl:  .  pioglitazone (ACTOS) 15 MG tablet, Take by mouth., Disp: , Rfl:  .  sildenafil (VIAGRA) 100 MG tablet, TAKE 1 TABLET BY MOUTH EVERY DAY  AS NEEDED, Disp: 10 tablet, Rfl: 5 .  SYRINGE-NEEDLE, DISP, 3 ML (B-D 3CC LUER-LOK SYR 25GX5/8") 25G X 5/8" 3 ML MISC, Use as directed, Disp: 100 each, Rfl: 2 .  XULTOPHY 100-3.6 UNIT-MG/ML SOPN, INJECT 20 UNITS SUBCUTANEOUSLY EVERY NIGHT, Disp: 15 pen, Rfl: 5   No Known Allergies   Review of Systems  Constitutional: Negative.   Eyes: Negative for blurred vision.  Respiratory: Negative.  Negative for shortness of breath.   Cardiovascular: Negative.  Negative for chest pain and palpitations.  Gastrointestinal: Negative.   Neurological: Negative.   Psychiatric/Behavioral: Negative.      Today's Vitals   08/05/18 1005  BP: 126/88  Pulse: 77  Temp: 98.1 F (36.7 C)  TempSrc: Oral  Weight: 260 lb 9.6 oz (118.2 kg)  Height: 6' 3"  (1.905 m)   Body mass index is 32.57 kg/m.   Objective:  Physical Exam Vitals signs and nursing note reviewed.  Constitutional:      Appearance: Normal appearance. He is obese.  HENT:     Head: Normocephalic and atraumatic.  Cardiovascular:     Rate and Rhythm: Normal rate and regular rhythm.     Heart sounds: Normal heart sounds.  Pulmonary:     Effort: Pulmonary effort is normal.     Breath sounds: Normal breath sounds.  Skin:    General: Skin is warm.  Neurological:     General: No focal deficit present.  Mental Status: He is alert.  Psychiatric:        Mood and Affect: Mood normal.        Behavior: Behavior normal.         Assessment And Plan:     1. Type 2 diabetes mellitus with stage 2 chronic kidney disease, without long-term current use of insulin ([place])  I will check labs as listed below. Importance of regular exercise was discussed with the patient.  He is encouraged to avoid sugary beverages.   - Hemoglobin A1c - BMP8+EGFR  2. Hypertensive nephropathy  Fair control, diastolic bp is slightly elevated.   3. Class 1 obesity due to excess calories with serious comorbidity and body mass index (BMI) of 32.0 to 32.9 in  adult  He is encouraged to initially strive for BMI less than 30 to decrease cardiac risk. He is advised to exercise no less than 150 minutes per week.     4. Screen for colon cancer  I will refer him to GI for CRC screening.   - Ambulatory referral to Gastroenterology        [name], MD

## 2018-08-09 LAB — HM DIABETES EYE EXAM

## 2018-08-20 ENCOUNTER — Encounter: Payer: Self-pay | Admitting: Internal Medicine

## 2018-10-22 ENCOUNTER — Other Ambulatory Visit: Payer: Self-pay

## 2018-10-22 MED ORDER — SILDENAFIL CITRATE 100 MG PO TABS: 100.0000 mg | ORAL_TABLET | Freq: Every day | ORAL | 5 refills | Status: DC | PRN

## 2018-10-22 MED ORDER — METFORMIN HCL 1000 MG PO TABS: 1000.0000 mg | ORAL_TABLET | Freq: Two times a day (BID) | ORAL | 1 refills | Status: DC

## 2018-10-22 MED ORDER — AMLODIPINE-ATORVASTATIN 10-20 MG PO TABS: 1.0000 | ORAL_TABLET | Freq: Every day | ORAL | 1 refills | Status: DC

## 2018-10-22 MED ORDER — PIOGLITAZONE HCL 15 MG PO TABS: 15.0000 mg | ORAL_TABLET | Freq: Every day | ORAL | 1 refills | Status: DC

## 2018-11-24 ENCOUNTER — Ambulatory Visit: Payer: BC Managed Care – PPO | Admitting: Internal Medicine

## 2018-11-24 ENCOUNTER — Other Ambulatory Visit: Payer: Self-pay

## 2018-11-24 ENCOUNTER — Encounter: Payer: Self-pay | Admitting: Internal Medicine

## 2018-11-24 VITALS — BP 128/76 | HR 70 | Temp 97.9°F | Ht 72.6 in | Wt 262.0 lb

## 2018-11-24 DIAGNOSIS — I129 Hypertensive chronic kidney disease with stage 1 through stage 4 chronic kidney disease, or unspecified chronic kidney disease: Secondary | ICD-10-CM

## 2018-11-24 DIAGNOSIS — D519 Vitamin B12 deficiency anemia, unspecified: Secondary | ICD-10-CM | POA: Diagnosis not present

## 2018-11-24 DIAGNOSIS — N182 Chronic kidney disease, stage 2 (mild): Secondary | ICD-10-CM

## 2018-11-24 DIAGNOSIS — Z0000 Encounter for general adult medical examination without abnormal findings: Secondary | ICD-10-CM | POA: Diagnosis not present

## 2018-11-24 DIAGNOSIS — E6609 Other obesity due to excess calories: Secondary | ICD-10-CM

## 2018-11-24 DIAGNOSIS — E1122 Type 2 diabetes mellitus with diabetic chronic kidney disease: Secondary | ICD-10-CM | POA: Diagnosis not present

## 2018-11-24 DIAGNOSIS — Z6834 Body mass index (BMI) 34.0-34.9, adult: Secondary | ICD-10-CM

## 2018-11-24 LAB — POCT URINALYSIS DIPSTICK
Bilirubin, UA: NEGATIVE
Blood, UA: NEGATIVE
Glucose, UA: NEGATIVE
Ketones, UA: NEGATIVE
Leukocytes, UA: NEGATIVE
Nitrite, UA: NEGATIVE
Protein, UA: POSITIVE — AB
Spec Grav, UA: 1.025 (ref 1.010–1.025)
Urobilinogen, UA: 1 E.U./dL
pH, UA: 5.5 (ref 5.0–8.0)

## 2018-11-24 LAB — POCT UA - MICROALBUMIN
Creatinine, POC: 300 mg/dL
Microalbumin Ur, POC: 150 mg/L

## 2018-11-24 MED ORDER — CYANOCOBALAMIN 1000 MCG/ML IJ SOLN
1000.0000 ug | Freq: Once | INTRAMUSCULAR | Status: AC
  Administered 2018-11-24: 1000 ug via INTRAMUSCULAR

## 2018-11-24 NOTE — Progress Notes (Signed)
Subjective:     Patient ID: William Mcguire , male    DOB: 01/23/1956 , 63 y.o.   MRN: [id number]   Chief Complaint  Patient presents with  . Annual Exam  . Diabetes  . Hypertension    HPI  He is here today for a full physical examination. He has no specific concerns or complaints at this time.   Diabetes  He presents for his follow-up diabetic visit. He has type 2 diabetes mellitus. There are no hypoglycemic associated symptoms. There are no diabetic associated symptoms. Pertinent negatives for diabetes include no blurred vision and no chest pain. There are no hypoglycemic complications. Risk factors for coronary artery disease include diabetes mellitus, dyslipidemia, hypertension and male sex. Current diabetic treatment includes insulin injections. He is compliant with treatment most of the time. He is following a diabetic diet. He participates in exercise intermittently. His breakfast blood glucose is taken between 8-9 am. His breakfast blood glucose range is generally 110-130 mg/dl. An ACE inhibitor/angiotensin II receptor blocker is being taken. Eye exam is current.  Hypertension  This is a chronic problem. The current episode started more than 1 year ago. The problem has been gradually improving since onset. The problem is controlled. Pertinent negatives include no blurred vision, chest pain, palpitations or shortness of breath. The current treatment provides moderate improvement.   He reports compliance with meds.   Past Medical History:  Diagnosis Date  . Diabetes mellitus   . Hypertension      Family History  Problem Relation Age of Onset  . Hypertension Mother   . Multiple myeloma Mother   . Hypertension Father   . Diabetes Father      Current Outpatient Medications:  .  amlodipine-atorvastatin (CADUET) 10-20 MG tablet, Take 1 tablet by mouth daily., Disp: 90 tablet, Rfl: 1 .  aspirin EC 81 MG tablet, Take 81 mg by mouth daily.  , Disp: , Rfl:  .  B-D ULTRAFINE III  SHORT PEN 31G X 8 MM MISC, See admin instructions., Disp: , Rfl: 99 .  Cyanocobalamin (VITAMIN B-12 IJ), Inject as directed., Disp: , Rfl:  .  metFORMIN (GLUCOPHAGE) 1000 MG tablet, Take 1 tablet (1,000 mg total) by mouth 2 (two) times daily with a meal., Disp: 90 tablet, Rfl: 1 .  pioglitazone (ACTOS) 15 MG tablet, Take 1 tablet (15 mg total) by mouth daily., Disp: 90 tablet, Rfl: 1 .  sildenafil (VIAGRA) 100 MG tablet, Take 1 tablet (100 mg total) by mouth daily as needed., Disp: 10 tablet, Rfl: 5 .  SYRINGE-NEEDLE, DISP, 3 ML (B-D 3CC LUER-LOK SYR 25GX5/8") 25G X 5/8" 3 ML MISC, Use as directed, Disp: 100 each, Rfl: 2 .  XULTOPHY 100-3.6 UNIT-MG/ML SOPN, INJECT 20 UNITS SUBCUTANEOUSLY EVERY NIGHT, Disp: 15 pen, Rfl: 5   No Known Allergies   Men's preventive visit. Patient Health Questionnaire (PHQ-2) is    Office Visit from 11/24/2018 in [facility]  PHQ-2 Total Score  0    . Patient is on a diabetic diet. Marital status: Single. Relevant history for alcohol use is:  Social History   Substance and Sexual Activity  Alcohol Use No  . Relevant history for tobacco use is:  Social History   Tobacco Use  Smoking Status Never Smoker  Smokeless Tobacco Never Used  .  Review of Systems  Constitutional: Negative.   HENT: Negative.   Eyes: Negative.  Negative for blurred vision.  Respiratory: Negative.  Negative for shortness of breath.  Cardiovascular: Negative.  Negative for chest pain and palpitations.  Gastrointestinal: Negative.   Endocrine: Negative.   Genitourinary: Negative.   Musculoskeletal: Negative.   Skin: Negative.   Allergic/Immunologic: Negative.   Neurological: Negative.   Hematological: Negative.   Psychiatric/Behavioral: Negative.      Today's Vitals   11/24/18 1501  BP: 128/76  Pulse: 70  Temp: 97.9 F (36.6 C)  TempSrc: Oral  Weight: 262 lb (118.8 kg)  Height: 6' 0.6" (1.844 m)   Body mass index is 34.95 kg/m.   Objective:   Physical Exam Vitals signs and nursing note reviewed.  Constitutional:      Appearance: Normal appearance. He is obese.  HENT:     Head: Normocephalic and atraumatic.     Right Ear: Tympanic membrane, ear canal and external ear normal.     Left Ear: Tympanic membrane, ear canal and external ear normal.     Nose: Nose normal.     Mouth/Throat:     Mouth: Mucous membranes are moist.     Pharynx: Oropharynx is clear.  Eyes:     Extraocular Movements: Extraocular movements intact.     Conjunctiva/sclera: Conjunctivae normal.     Pupils: Pupils are equal, round, and reactive to light.  Neck:     Musculoskeletal: Normal range of motion and neck supple.  Cardiovascular:     Rate and Rhythm: Normal rate and regular rhythm.     Pulses: Normal pulses.          Dorsalis pedis pulses are 2+ on the right side and 2+ on the left side.       Posterior tibial pulses are 2+ on the right side and 2+ on the left side.     Heart sounds: Normal heart sounds.  Pulmonary:     Effort: Pulmonary effort is normal.     Breath sounds: Normal breath sounds.  Chest:     Breasts:        Right: Normal. No swelling, bleeding, inverted nipple, mass or nipple discharge.        Left: Normal. No swelling, bleeding, inverted nipple, mass or nipple discharge.  Abdominal:     General: Bowel sounds are normal.     Palpations: Abdomen is soft.     Comments: Rounded  Genitourinary:    Comments: Deferred as per patient Musculoskeletal: Normal range of motion.  Feet:     Right foot:     Protective Sensation: 5 sites tested. 5 sites sensed.     Skin integrity: Skin integrity normal.     Toenail Condition: Right toenails are normal.     Left foot:     Protective Sensation: 5 sites tested. 5 sites sensed.     Skin integrity: Skin integrity normal.     Toenail Condition: Left toenails are normal.  Skin:    General: Skin is warm.  Neurological:     General: No focal deficit present.     Mental Status: He is  alert.  Psychiatric:        Mood and Affect: Mood normal.        Behavior: Behavior normal.         Assessment And Plan:     1. Routine general medical examination at health care facility  A full exam was performed.  DRE deferred, per patient request. He reports he is followed by Urology for prostate exams. PATIENT HAS BEEN ADVISED TO GET 30-45 MINUTES REGULAR EXERCISE NO LESS THAN FOUR TO FIVE DAYS PER  WEEK - BOTH WEIGHTBEARING EXERCISES AND AEROBIC ARE RECOMMENDED.  HE IS ADVISED TO FOLLOW A HEALTHY DIET WITH AT LEAST SIX FRUITS/VEGGIES PER DAY, DECREASE INTAKE OF RED MEAT, AND TO INCREASE FISH INTAKE TO TWO DAYS PER WEEK.  MEATS/FISH SHOULD NOT BE FRIED, BAKED OR BROILED IS PREFERABLE.  I SUGGEST WEARING SPF 50 SUNSCREEN ON EXPOSED PARTS AND ESPECIALLY WHEN IN THE DIRECT SUNLIGHT FOR AN EXTENDED PERIOD OF TIME.  PLEASE AVOID FAST FOOD RESTAURANTS AND INCREASE YOUR WATER INTAKE.  - CMP14+EGFR - CBC - Lipid panel - Hemoglobin A1c  2. Type 2 diabetes mellitus with stage 2 chronic kidney disease, without long-term current use of insulin ([place])  Diabetic foot exam was performed.  I DISCUSSED WITH THE PATIENT AT LENGTH REGARDING THE GOALS OF GLYCEMIC CONTROL AND POSSIBLE LONG-TERM COMPLICATIONS.  I  ALSO STRESSED THE IMPORTANCE OF COMPLIANCE WITH HOME GLUCOSE MONITORING, DIETARY RESTRICTIONS INCLUDING AVOIDANCE OF SUGARY DRINKS/PROCESSED FOODS,  ALONG WITH REGULAR EXERCISE.  I  ALSO STRESSED THE IMPORTANCE OF ANNUAL EYE EXAMS, SELF FOOT CARE AND COMPLIANCE WITH OFFICE VISITS.  - POCT Urinalysis Dipstick (81002) - POCT UA - Microalbumin  3. Hypertensive nephropathy  Well controlled. He will continue with current meds. He is encouraged to avoid adding salt to his foods. EKG performed, no new changes noted. He will rto in six months for re-evaluation.   - EKG 12-Lead  4. Anemia due to vitamin B12 deficiency, unspecified B12 deficiency type  I will check vit b12 level and he was also given  vit B12 injection. He self-administers monthly vitamin B12 injections.   - Vitamin B12 - cyanocobalamin ((VITAMIN B-12)) injection 1,000 mcg   5. Class 1 obesity due to excess calories with serious comorbidity and body mass index (BMI) of 34.0 to 34.9 in adult  Importance of achieving optimal weight to decrease risk of cardiovascular disease and cancers was discussed with the patient in full detail. He is encouraged to start slowly - start with 10 minutes twice daily at least three to four days per week and to gradually build to 30 minutes five days weekly. He was given tips to incorporate more activity into her daily routine - take stairs when possible, park farther away from her job, grocery stores, etc.     [name], MD    THE PATIENT IS ENCOURAGED TO PRACTICE SOCIAL DISTANCING DUE TO THE COVID-19 PANDEMIC.

## 2018-11-24 NOTE — Patient Instructions (Signed)

## 2018-11-25 LAB — CMP14+EGFR
ALT: 25 IU/L (ref 0–44)
AST: 30 IU/L (ref 0–40)
Albumin/Globulin Ratio: 1.5 (ref 1.2–2.2)
Albumin: 4.8 g/dL (ref 3.8–4.8)
Alkaline Phosphatase: 117 IU/L (ref 39–117)
BUN/Creatinine Ratio: 10 (ref 10–24)
BUN: 12 mg/dL (ref 8–27)
Bilirubin Total: 0.5 mg/dL (ref 0.0–1.2)
CO2: 25 mmol/L (ref 20–29)
Calcium: 10.2 mg/dL (ref 8.6–10.2)
Chloride: 101 mmol/L (ref 96–106)
Creatinine, Ser: 1.21 mg/dL (ref 0.76–1.27)
GFR calc Af Amer: 73 mL/min/{1.73_m2} (ref >59)
GFR calc non Af Amer: 63 mL/min/{1.73_m2} (ref >59)
Globulin, Total: 3.2 g/dL (ref 1.5–4.5)
Glucose: 90 mg/dL (ref 65–99)
Potassium: 4.2 mmol/L (ref 3.5–5.2)
Sodium: 142 mmol/L (ref 134–144)
Total Protein: 8 g/dL (ref 6.0–8.5)

## 2018-11-25 LAB — LIPID PANEL
Chol/HDL Ratio: 3.4 ratio (ref 0.0–5.0)
Cholesterol, Total: 175 mg/dL (ref 100–199)
HDL: 51 mg/dL (ref >39)
LDL Calculated: 106 mg/dL — ABNORMAL HIGH (ref 0–99)
Triglycerides: 88 mg/dL (ref 0–149)
VLDL Cholesterol Cal: 18 mg/dL (ref 5–40)

## 2018-11-25 LAB — CBC
Hematocrit: 40.8 % (ref 37.5–51.0)
Hemoglobin: 13.3 g/dL (ref 13.0–17.7)
MCH: 27.8 pg (ref 26.6–33.0)
MCHC: 32.6 g/dL (ref 31.5–35.7)
MCV: 85 fL (ref 79–97)
Platelets: 232 10*3/uL (ref 150–450)
RBC: 4.79 x10E6/uL (ref 4.14–5.80)
RDW: 13.4 % (ref 11.6–15.4)
WBC: 7 10*3/uL (ref 3.4–10.8)

## 2018-11-25 LAB — HEMOGLOBIN A1C
Est. average glucose Bld gHb Est-mCnc: 220 mg/dL
Hgb A1c MFr Bld: 9.3 % — ABNORMAL HIGH (ref 4.8–5.6)

## 2018-11-25 LAB — VITAMIN B12: Vitamin B-12: 651 pg/mL (ref 232–1245)

## 2018-11-26 ENCOUNTER — Encounter: Payer: Self-pay | Admitting: Internal Medicine

## 2018-12-07 ENCOUNTER — Other Ambulatory Visit: Payer: Self-pay

## 2018-12-07 NOTE — Telephone Encounter (Signed)
Need clarification of which medication he needs a refill for

## 2018-12-31 ENCOUNTER — Encounter: Payer: Self-pay | Admitting: Internal Medicine

## 2019-01-10 LAB — HM COLONOSCOPY

## 2019-01-14 ENCOUNTER — Other Ambulatory Visit: Payer: Self-pay | Admitting: Internal Medicine

## 2019-01-31 ENCOUNTER — Other Ambulatory Visit: Payer: Self-pay | Admitting: Internal Medicine

## 2019-02-14 ENCOUNTER — Encounter: Payer: Self-pay | Admitting: Orthopedic Surgery

## 2019-02-14 ENCOUNTER — Ambulatory Visit (INDEPENDENT_AMBULATORY_CARE_PROVIDER_SITE_OTHER): Payer: Medicare Other | Admitting: Orthopedic Surgery

## 2019-02-14 ENCOUNTER — Ambulatory Visit: Payer: Self-pay

## 2019-02-14 DIAGNOSIS — M25562 Pain in left knee: Secondary | ICD-10-CM

## 2019-02-14 DIAGNOSIS — M170 Bilateral primary osteoarthritis of knee: Secondary | ICD-10-CM

## 2019-02-14 DIAGNOSIS — M25561 Pain in right knee: Secondary | ICD-10-CM | POA: Diagnosis not present

## 2019-02-15 ENCOUNTER — Encounter: Payer: Self-pay | Admitting: Orthopedic Surgery

## 2019-02-15 ENCOUNTER — Encounter: Payer: Self-pay | Admitting: Internal Medicine

## 2019-02-15 NOTE — Progress Notes (Signed)
Office Visit Note   Patient: William Mcguire           Date of Birth: 04/16/1956           MRN: [id number] Visit Date: 02/14/2019 Requested by: [name], [address] [place],  [place] [address] PCP: [name], MD  Subjective: Chief Complaint  Patient presents with   Right Knee - Pain   Left Knee - Pain    HPI: William Mcguire is a 63 y.o. male who presents to the office complaining of bilateral knee pain. Pt reports 2 months of bilateral knee pain (L>R).  Pt denies any acute injury but notes a history of injury to his bilateral knees ~15 years ago.  Pt localizes the pain to the bilateral medial knees.  Patient notes stiffness and swelling of the bilateral knees but denies any mechanical symptoms or waking with pain.  He has not been taking any medications to improve his pain, only using occasional heat and ice modalities.  Denies any groin or low back pain.  History of prior knee scope of the left knee and right knee patellar surgery.  Patient has not had a recent cortisone injection into either knee.  Patient has a history of diabetes and his blood sugar today was 150.              ROS:  All systems reviewed are negative as they relate to the chief complaint within the history of present illness.  Patient denies fevers or chills.  Assessment & Plan: Visit Diagnoses:  1. Pain in both knees, unspecified chronicity     Plan: Patient is a 63 year old male who presents with bilateral knee pain, left greater than right.  Patient symptoms are primarily located in the medial aspect of the bilateral knees.  X-rays today reveal significant joint space narrowing of the medial compartment bilaterally.  Offered cortisone injection of the left knee today, as this is the knee that is giving him the most pain.  Patient agreed with plan and tolerated the procedure well.  We will preapprove him for Visco supplementation injections for when his pain returns.  Patient will  follow-up with the office as needed.  This patient is diagnosed with osteoarthritis of the knee(s).    Radiographs show evidence of joint space narrowing, osteophytes, subchondral sclerosis and/or subchondral cysts.  This patient has knee pain which interferes with functional and activities of daily living.    This patient has experienced inadequate response, adverse effects and/or intolerance with conservative treatments such as acetaminophen, NSAIDS, topical creams, physical therapy or regular exercise, knee bracing and/or weight loss.   This patient has experienced inadequate response or has a contraindication to intra articular steroid injections for at least 3 months.   This patient is not scheduled to have a total knee replacement within 6 months of starting treatment with viscosupplementation.  Follow-Up Instructions: No follow-ups on file.   Orders:  Orders Placed This Encounter  Procedures   XR KNEE 3 VIEW RIGHT   XR Knee 1-2 Views Left   No orders of the defined types were placed in this encounter.     Procedures: Large Joint Inj: L knee on 02/15/2019 10:37 PM Indications: diagnostic evaluation, joint swelling and pain Details: 18 G 1.5 in needle, superolateral approach  Arthrogram: No  Medications: 4 mL bupivacaine 0.25 %; 5 mL lidocaine 1 %; 40 mg methylPREDNISolone acetate 40 MG/ML Outcome: tolerated well, no immediate complications Procedure, treatment alternatives, risks and benefits  explained, specific risks discussed. Consent was given by the patient. Immediately prior to procedure a time out was called to verify the correct patient, procedure, equipment, support staff and site/side marked as required. Patient was prepped and draped in the usual sterile fashion.       Clinical Data: No additional findings.  Objective: Vital Signs: There were no vitals taken for this visit.  Physical Exam:  Constitutional: Patient appears well-developed HEENT:  Head:  Normocephalic Eyes:EOM are normal Neck: Normal range of motion Cardiovascular: Normal rate Pulmonary/chest: Effort normal Neurologic: Patient is alert Skin: Skin is warm Psychiatric: Patient has normal mood and affect  Ortho Exam:  Bilateral knee Exam No effusion Extensor mechanism intact No TTP over the lateral jointlines, quad tendon, patellar tendon, pes anserinus, patella, tibial tubercle, LCL/MCL insertions Moderate TTP over the medial joint lines bilaterally Stable to varus/valgus stresses.  Stable to anterior/posterior drawer Extension to 0 degrees Flexion > 90 degrees  Specialty Comments:  No specialty comments available.  Imaging: No results found.   PMFS History: Patient Active Problem List   Diagnosis Date Noted   Hypertensive nephropathy 08/05/2018   Class 1 obesity due to excess calories with serious comorbidity and body mass index (BMI) of 32.0 to 32.9 in adult 08/05/2018   Pyelonephritis 06/23/2011   AKI (acute kidney injury) ([place]) 06/23/2011   Diabetes mellitus 06/23/2011   HTN (hypertension) 06/23/2011   Past Medical History:  Diagnosis Date   Diabetes mellitus    Hypertension     Family History  Problem Relation Age of Onset   Hypertension Mother    Multiple myeloma Mother    Hypertension Father    Diabetes Father     Past Surgical History:  Procedure Laterality Date   KNEE SURGERY     torn cartilage   PATELLAR TENDON REPAIR  06/11/2011   Procedure: PATELLA TENDON REPAIR;  Surgeon: Meredith Pel;  Location: [facility];  Service: Orthopedics;  Laterality: Right;   Social History   Occupational History   Not on file  Tobacco Use   Smoking status: Never Smoker   Smokeless tobacco: Never Used  Substance and Sexual Activity   Alcohol use: No   Drug use: No   Sexual activity: Not on file

## 2019-02-17 ENCOUNTER — Encounter: Payer: Self-pay | Admitting: Orthopedic Surgery

## 2019-02-17 MED ORDER — LIDOCAINE HCL 1 % IJ SOLN
5.0000 mL | INTRAMUSCULAR | Status: AC | PRN
  Administered 2019-02-15: 23:00:00 5 mL

## 2019-02-17 MED ORDER — METHYLPREDNISOLONE ACETATE 40 MG/ML IJ SUSP
40.0000 mg | INTRAMUSCULAR | Status: AC | PRN
  Administered 2019-02-15: 23:00:00 40 mg via INTRA_ARTICULAR

## 2019-02-17 MED ORDER — BUPIVACAINE HCL 0.25 % IJ SOLN
4.0000 mL | INTRAMUSCULAR | Status: AC | PRN
  Administered 2019-02-15: 23:00:00 4 mL via INTRA_ARTICULAR

## 2019-03-02 DIAGNOSIS — Z0120 Encounter for dental examination and cleaning without abnormal findings: Secondary | ICD-10-CM | POA: Diagnosis not present

## 2019-03-14 ENCOUNTER — Other Ambulatory Visit: Payer: Self-pay | Admitting: Internal Medicine

## 2019-03-17 ENCOUNTER — Other Ambulatory Visit: Payer: Self-pay

## 2019-03-17 ENCOUNTER — Encounter: Payer: Self-pay | Admitting: Internal Medicine

## 2019-03-17 ENCOUNTER — Ambulatory Visit (INDEPENDENT_AMBULATORY_CARE_PROVIDER_SITE_OTHER): Payer: Medicare Other | Admitting: Internal Medicine

## 2019-03-17 VITALS — BP 160/90 | HR 73 | Temp 98.1°F | Ht 72.4 in | Wt 255.0 lb

## 2019-03-17 DIAGNOSIS — E6609 Other obesity due to excess calories: Secondary | ICD-10-CM | POA: Diagnosis not present

## 2019-03-17 DIAGNOSIS — I129 Hypertensive chronic kidney disease with stage 1 through stage 4 chronic kidney disease, or unspecified chronic kidney disease: Secondary | ICD-10-CM | POA: Diagnosis not present

## 2019-03-17 DIAGNOSIS — E1122 Type 2 diabetes mellitus with diabetic chronic kidney disease: Secondary | ICD-10-CM

## 2019-03-17 DIAGNOSIS — Z6834 Body mass index (BMI) 34.0-34.9, adult: Secondary | ICD-10-CM

## 2019-03-17 DIAGNOSIS — N182 Chronic kidney disease, stage 2 (mild): Secondary | ICD-10-CM

## 2019-03-17 DIAGNOSIS — D519 Vitamin B12 deficiency anemia, unspecified: Secondary | ICD-10-CM

## 2019-03-17 LAB — POCT UA - MICROALBUMIN
Creatinine, POC: 300 mg/dL
Microalbumin Ur, POC: 80 mg/L

## 2019-03-17 MED ORDER — ENALAPRIL MALEATE 5 MG PO TABS: 5.0000 mg | ORAL_TABLET | Freq: Every day | ORAL | 1 refills | Status: DC

## 2019-03-17 NOTE — Progress Notes (Signed)
Subjective:     Patient ID: William Mcguire , male    DOB: 12/28/1955 , 63 y.o.   MRN: [id number]   Chief Complaint  Patient presents with  . Diabetes  . Hypertension    HPI  Diabetes He presents for his follow-up diabetic visit. He has type 2 diabetes mellitus. His disease course has been stable. There are no hypoglycemic associated symptoms. Pertinent negatives for diabetes include no blurred vision and no chest pain. There are no hypoglycemic complications. Diabetic complications include nephropathy. Risk factors for coronary artery disease include diabetes mellitus, dyslipidemia, hypertension, male sex and sedentary lifestyle. His breakfast blood glucose is taken between 8-9 am. His breakfast blood glucose range is generally 90-110 mg/dl.  Hypertension This is a chronic problem. The current episode started more than 1 year ago. The problem has been gradually improving since onset. The problem is controlled. Pertinent negatives include no blurred vision, chest pain, palpitations or shortness of breath.     Past Medical History:  Diagnosis Date  . Diabetes mellitus   . Hypertension      Family History  Problem Relation Age of Onset  . Hypertension Mother   . Multiple myeloma Mother   . Hypertension Father   . Diabetes Father      Current Outpatient Medications:  .  amlodipine-atorvastatin (CADUET) 10-20 MG tablet, Take 1 tablet by mouth daily., Disp: 90 tablet, Rfl: 1 .  aspirin EC 81 MG tablet, Take 81 mg by mouth daily.  , Disp: , Rfl:  .  B-D ULTRAFINE III SHORT PEN 31G X 8 MM MISC, USE AS DIRECTED, Disp: 100 each, Rfl: PRN .  cyanocobalamin (,VITAMIN B-12,) 1000 MCG/ML injection, INJECT 1 ML INTRAMUSCULARLY WEEKLY FOR 3 WEEKS, THEN USE ONCE MONTHLY, Disp: 9 mL, Rfl: 3 .  metFORMIN (GLUCOPHAGE) 1000 MG tablet, TAKE 1 TABLET BY MOUTH TWICE A DAY WITH A MEAL, Disp: 180 tablet, Rfl: 0 .  pioglitazone (ACTOS) 15 MG tablet, Take 1 tablet (15 mg total) by mouth daily., Disp: 90  tablet, Rfl: 1 .  sildenafil (VIAGRA) 100 MG tablet, TAKE 1 TABLET BY MOUTH EVERY DAY AS NEEDED, Disp: 10 tablet, Rfl: 5 .  SYRINGE-NEEDLE, DISP, 3 ML (B-D 3CC LUER-LOK SYR 25GX5/8") 25G X 5/8" 3 ML MISC, Use as directed, Disp: 100 each, Rfl: 2 .  XULTOPHY 100-3.6 UNIT-MG/ML SOPN, INJECT 20 UNITS SUBCUTANEOUSLY EVERY NIGHT, Disp: 15 pen, Rfl: 5 .  Cyanocobalamin (VITAMIN B-12 IJ), Inject as directed., Disp: , Rfl:    No Known Allergies   Review of Systems  Constitutional: Negative.   Eyes: Negative for blurred vision.  Respiratory: Negative.  Negative for shortness of breath.   Cardiovascular: Negative.  Negative for chest pain and palpitations.  Gastrointestinal: Negative.   Neurological: Negative.   Psychiatric/Behavioral: Negative.      Today's Vitals   03/17/19 0844  BP: (!) 160/90  Pulse: 73  Temp: 98.1 F (36.7 C)  TempSrc: Oral  SpO2: 97%  Weight: 255 lb (115.7 kg)  Height: 6' 0.4" (1.839 m)   Body mass index is 34.2 kg/m.   Objective:  Physical Exam Vitals signs and nursing note reviewed.  Constitutional:      Appearance: Normal appearance.  Cardiovascular:     Rate and Rhythm: Normal rate and regular rhythm.     Heart sounds: Normal heart sounds.  Pulmonary:     Effort: Pulmonary effort is normal.     Breath sounds: Normal breath sounds.  Skin:    General: Skin  is warm.  Neurological:     General: No focal deficit present.     Mental Status: He is alert.  Psychiatric:        Mood and Affect: Mood normal.         Assessment And Plan:     1. Type 2 diabetes mellitus with stage 2 chronic kidney disease, without long-term current use of insulin ([place])  I will check labs as listed below. Importance of medication and dietary compliance was discussed with the patient. He will rto in 3 months for re-evaluation.   - BMP8+EGFR - Hemoglobin A1c  2. Hypertensive nephropathy  Uncontrolled. After review of his meds, he is no longer taking enalapril. He is  not sure why. Urine microalbumin positive for protein. Importance of compliance with ACE-inhibitors for renal protection was discussed with the patient. I will resume enalapril 42m daily.   3. Class 1 obesity due to excess calories with serious comorbidity and body mass index (BMI) of 34.0 to 34.9 in adult  He was congratulated on his 9 pound weight loss since his last visit. He was encouraged to keep up the great work. He is encouraged to strive for BMI less than 29 to decrease cardiac risk.   4. Anemia due to vitamin B12 deficiency, unspecified B12 deficiency type  He will not receive vit B12 injection today. He reports self-administration of vit B12 the first of the month.    RMaximino Greenland MD    THE PATIENT IS ENCOURAGED TO PRACTICE SOCIAL DISTANCING DUE TO THE COVID-19 PANDEMIC.

## 2019-03-17 NOTE — Patient Instructions (Signed)

## 2019-03-18 ENCOUNTER — Other Ambulatory Visit: Payer: Self-pay

## 2019-03-18 DIAGNOSIS — Z79899 Other long term (current) drug therapy: Secondary | ICD-10-CM

## 2019-03-18 LAB — BMP8+EGFR
BUN/Creatinine Ratio: 10 (ref 10–24)
BUN: 11 mg/dL (ref 8–27)
CO2: 26 mmol/L (ref 20–29)
Calcium: 9.7 mg/dL (ref 8.6–10.2)
Chloride: 105 mmol/L (ref 96–106)
Creatinine, Ser: 1.11 mg/dL (ref 0.76–1.27)
GFR calc Af Amer: 80 mL/min/{1.73_m2} (ref >59)
GFR calc non Af Amer: 69 mL/min/{1.73_m2} (ref >59)
Glucose: 92 mg/dL (ref 65–99)
Potassium: 4.4 mmol/L (ref 3.5–5.2)
Sodium: 143 mmol/L (ref 134–144)

## 2019-03-18 LAB — HEMOGLOBIN A1C
Est. average glucose Bld gHb Est-mCnc: 200 mg/dL
Hgb A1c MFr Bld: 8.6 % — ABNORMAL HIGH (ref 4.8–5.6)

## 2019-03-30 ENCOUNTER — Encounter: Payer: Self-pay | Admitting: Internal Medicine

## 2019-03-30 ENCOUNTER — Other Ambulatory Visit: Payer: Self-pay

## 2019-03-30 ENCOUNTER — Ambulatory Visit: Payer: Medicare Other

## 2019-03-30 VITALS — BP 162/88 | HR 78 | Temp 98.5°F | Ht 72.4 in | Wt 255.0 lb

## 2019-03-30 DIAGNOSIS — I10 Essential (primary) hypertension: Secondary | ICD-10-CM

## 2019-03-30 DIAGNOSIS — E1122 Type 2 diabetes mellitus with diabetic chronic kidney disease: Secondary | ICD-10-CM

## 2019-03-30 DIAGNOSIS — N182 Chronic kidney disease, stage 2 (mild): Secondary | ICD-10-CM

## 2019-03-30 NOTE — Progress Notes (Signed)
Pt presents today for blood pressure check 162/88 provider aware

## 2019-04-06 ENCOUNTER — Ambulatory Visit: Payer: Self-pay

## 2019-04-06 ENCOUNTER — Ambulatory Visit (INDEPENDENT_AMBULATORY_CARE_PROVIDER_SITE_OTHER): Payer: Medicare Other

## 2019-04-06 DIAGNOSIS — I10 Essential (primary) hypertension: Secondary | ICD-10-CM

## 2019-04-06 DIAGNOSIS — N182 Chronic kidney disease, stage 2 (mild): Secondary | ICD-10-CM | POA: Diagnosis not present

## 2019-04-06 DIAGNOSIS — E1122 Type 2 diabetes mellitus with diabetic chronic kidney disease: Secondary | ICD-10-CM | POA: Diagnosis not present

## 2019-04-06 NOTE — Chronic Care Management (AMB) (Signed)
  Chronic Care Management   Initial Visit Note  04/06/2019 Name: Delmont Sherod MRN: [id number] DOB: 06/14/1956  Referred by: [name], MD Reason for referral : Care Coordination (CC RNCM Case Collaboration )   Elimelech Merkin is a 63 y.o. year old male who is a primary care patient of [name], MD. The care management team was consulted for assistance with chronic disease management and care coordination needs.   Review of patient status, including review of consultants reports, relevant laboratory and other test results, and collaboration with appropriate care team members and the patient's provider was performed as part of comprehensive patient evaluation and provision of chronic care management services.    I initiated and established the plan of care for [name] during one on one collaboration with my clinical care management colleague Daneen Schick BSW who is also engaged with this patient to address social work needs.   Outpatient Encounter Medications as of 04/06/2019  Medication Sig  . amlodipine-atorvastatin (CADUET) 10-20 MG tablet Take 1 tablet by mouth daily.  [name] aspirin EC 81 MG tablet Take 81 mg by mouth daily.    . B-D ULTRAFINE III SHORT PEN 31G X 8 MM MISC USE AS DIRECTED  . cyanocobalamin (,VITAMIN B-12,) 1000 MCG/ML injection INJECT 1 ML INTRAMUSCULARLY WEEKLY FOR 3 WEEKS, THEN USE ONCE MONTHLY  . Cyanocobalamin (VITAMIN B-12 IJ) Inject as directed.  . enalapril (VASOTEC) 5 MG tablet Take 1 tablet (5 mg total) by mouth daily.  . metFORMIN (GLUCOPHAGE) 1000 MG tablet TAKE 1 TABLET BY MOUTH TWICE A DAY WITH A MEAL  . pioglitazone (ACTOS) 15 MG tablet Take 1 tablet (15 mg total) by mouth daily.  . sildenafil (VIAGRA) 100 MG tablet TAKE 1 TABLET BY MOUTH EVERY DAY AS NEEDED  . SYRINGE-NEEDLE, DISP, 3 ML (B-D 3CC LUER-LOK SYR 25GX5/8") 25G X 5/8" 3 ML MISC Use as directed  . XULTOPHY 100-3.6 UNIT-MG/ML SOPN INJECT 20 UNITS SUBCUTANEOUSLY EVERY NIGHT    No facility-administered encounter medications on file as of 04/06/2019.      Goals Addressed    . Assist with Chronic Care Management and Care Coordination needs       Current Barriers:  [name] Knowledge Barriers related to resources and support available to address needs related to Chronic disease management and Care Coordination   Case Manager Clinical Goal(s):  [name] Over the next 30 days, patient will work with the CCM team to address needs related to chronic disease management and care coordination needs, including financial assistance with cost of Xultrophy  Interventions:  . Collaborated with BSW and initiated plan of care to address needs related to chronic disease management for DMII and assistance with financial resources needed to help reduce the cost of Xultrophy  Patient Self Care Activities:  . Attends all scheduled provider appointments . Calls provider office for new concerns or questions  Initial goal documentation         Telephone follow up appointment with care management team member scheduled for: 05/04/19  Barb Merino, RN, BSN, CCM Care Management Coordinator [place] [facility]  Direct Phone: [phone]

## 2019-04-06 NOTE — Chronic Care Management (AMB) (Signed)
Chronic Care Management   Social Work General Note  04/06/2019 Name: William Mcguire MRN: [id number] DOB: 1955/11/16  William Mcguire is a 63 y.o. year old male who is a primary care patient of [name], MD. The CCM was consulted to assist the patient with chronic case management and care coordination.  William Mcguire was given information about Chronic Care Management services today including:  1. CCM service includes personalized support from designated clinical staff supervised by his physician, including individualized plan of care and coordination with other care providers 2. 24/7 contact phone numbers for assistance for urgent and routine care needs. 3. Service will only be billed when office clinical staff spend 20 minutes or more in a month to coordinate care. 4. Only one practitioner may furnish and bill the service in a calendar month. 5. The patient may stop CCM services at any time (effective at the end of the month) by phone call to the office staff. 6. The patient will be responsible for cost sharing (co-pay) of up to 20% of the service fee (after annual deductible is met).  Patient agreed to services and verbal consent obtained.   Review of patient status, including review of consultants reports, relevant laboratory and other test results, and collaboration with appropriate care team members and the patient's provider was performed as part of comprehensive patient evaluation and provision of chronic care management services.    SDOH (Social Determinants of Health) screening performed today. See Care Plan Entry related to challenges with: None   The patient does indicate concerns with medication costs. SW has collaborated with PharmD to request patient assistance.  Outpatient Encounter Medications as of 04/06/2019  Medication Sig  . amlodipine-atorvastatin (CADUET) 10-20 MG tablet Take 1 tablet by mouth daily.  [name] aspirin EC 81 MG tablet Take 81 mg by mouth daily.    .  B-D ULTRAFINE III SHORT PEN 31G X 8 MM MISC USE AS DIRECTED  . cyanocobalamin (,VITAMIN B-12,) 1000 MCG/ML injection INJECT 1 ML INTRAMUSCULARLY WEEKLY FOR 3 WEEKS, THEN USE ONCE MONTHLY  . Cyanocobalamin (VITAMIN B-12 IJ) Inject as directed.  . enalapril (VASOTEC) 5 MG tablet Take 1 tablet (5 mg total) by mouth daily.  . metFORMIN (GLUCOPHAGE) 1000 MG tablet TAKE 1 TABLET BY MOUTH TWICE A DAY WITH A MEAL  . pioglitazone (ACTOS) 15 MG tablet Take 1 tablet (15 mg total) by mouth daily.  . sildenafil (VIAGRA) 100 MG tablet TAKE 1 TABLET BY MOUTH EVERY DAY AS NEEDED  . SYRINGE-NEEDLE, DISP, 3 ML (B-D 3CC LUER-LOK SYR 25GX5/8") 25G X 5/8" 3 ML MISC Use as directed  . XULTOPHY 100-3.6 UNIT-MG/ML SOPN INJECT 20 UNITS SUBCUTANEOUSLY EVERY NIGHT   No facility-administered encounter medications on file as of 04/06/2019.     Goals Addressed            This Visit's Progress   . COMPLETED: Assist with Chronic Care Management enrollment and perform SW screen to identify community resource needs       Current Barriers:  [name] Knowledge Barriers related to resources and support available to address needs related to Chronic Care Management and challenges surrounding Social Determinants of Health  Clinical Social Work Clinical Goal(s):   Over the next 10 days, the patient will understand the role of the CCM team and work with SW to complete SDOH (Social Determinants of Health) screen.  Interventions:  Outbound call placed to the patient to introduce the Chronic Care Management program  Patient Education provided regarding referral placed  by Dr Baird Cancer and obtained verbal consent for enrollment  Patient screened for SDOH (Social Determinants of Health)  Identified challenges with: none  Assessed for patient concern to afford medications: patient expresses difficulty affording Xultophy  Determined the patient has enough to last until the end of the month but is unable to purchase a new prescription   Chart reviewed to indicate most recent A1C to be 8.6 on 9/24  Advised the patient to expect a follow up call by embedded PharmD to address medication cost concerns as well as RN Case Manager to assist with disease management  Collaboration with CCM team to communicate patient's enrollment into the program and identified case management needs  Patient Self Care Activities:   Performs ADL's independently  Contacts provider with new concerns  Administers medications as prescribed  Initial goal documentation:          Follow Up Plan: No further SW follow up planned at this time. The patient will be contacted by embedded PharmD and RN Case Manager in the coming weeks.       Daneen Schick, BSW, CDP Social Worker, Certified Dementia Practitioner [place] [facility] [place] [facility] [phone]  Total time spent performing care coordination and/or care management activities with the patient by phone or face to face = 12 minutes.

## 2019-04-06 NOTE — Patient Instructions (Signed)
Social Worker Visit Information  Goals we discussed today:  Goals Addressed            This Visit's Progress   . COMPLETED: Assist with Chronic Care Management enrollment and perform SW screen to identify community resource needs       Current Barriers:  [name] Knowledge Barriers related to resources and support available to address needs related to Chronic Care Management and challenges surrounding Social Determinants of Health  Clinical Social Work Clinical Goal(s):   Over the next 10 days, the patient will understand the role of the CCM team and work with SW to complete SDOH (Social Determinants of Health) screen.  Interventions:  Outbound call placed to the patient to introduce the Chronic Care Management program  Patient Education provided regarding referral placed by Dr Baird Cancer and obtained verbal consent for enrollment  Patient screened for SDOH (Social Determinants of Health)  Identified challenges with: none  Assessed for patient concern to afford medications: patient expresses difficulty affording Xultophy  Determined the patient has enough to last until the end of the month but is unable to purchase a new prescription  Chart reviewed to indicate most recent A1C to be 8.6 on 9/24  Advised the patient to expect a follow up call by embedded PharmD to address medication cost concerns as well as RN Case Manager to assist with disease management  Collaboration with CCM team to communicate patient's enrollment into the program and identified case management needs  Patient Self Care Activities:   Performs ADL's independently  Contacts provider with new concerns  Administers medications as prescribed  Initial goal documentation:          Materials provided: Verbal education about case management program provided by phone  Mr. Bodey was given information about Chronic Care Management services today including:  1. CCM service includes personalized support from  designated clinical staff supervised by his physician, including individualized plan of care and coordination with other care providers 2. 24/7 contact phone numbers for assistance for urgent and routine care needs. 3. Service will only be billed when office clinical staff spend 20 minutes or more in a month to coordinate care. 4. Only one practitioner may furnish and bill the service in a calendar month. 5. The patient may stop CCM services at any time (effective at the end of the month) by phone call to the office staff. 6. The patient will be responsible for cost sharing (co-pay) of up to 20% of the service fee (after annual deductible is met).  Patient agreed to services and verbal consent obtained.   The patient verbalized understanding of instructions provided today and declined a print copy of patient instruction materials.   Follow up plan: No further SW outreach planned at this time. The patient will be contacted by PharmD and RN Case Manager in the coming weeks to assist with medication costs and disease management.   Daneen Schick, BSW, CDP Social Worker, Certified Dementia Practitioner [place] [facility] [place] [facility] [phone]

## 2019-04-11 ENCOUNTER — Other Ambulatory Visit: Payer: Self-pay | Admitting: Internal Medicine

## 2019-04-11 ENCOUNTER — Ambulatory Visit: Payer: Self-pay | Admitting: Pharmacist

## 2019-04-11 ENCOUNTER — Other Ambulatory Visit: Payer: Self-pay | Admitting: Pharmacy Technician

## 2019-04-11 DIAGNOSIS — I10 Essential (primary) hypertension: Secondary | ICD-10-CM

## 2019-04-11 DIAGNOSIS — E1122 Type 2 diabetes mellitus with diabetic chronic kidney disease: Secondary | ICD-10-CM

## 2019-04-11 DIAGNOSIS — N182 Chronic kidney disease, stage 2 (mild): Secondary | ICD-10-CM

## 2019-04-11 NOTE — Patient Outreach (Signed)
[facility]) Care Management  04/11/2019  Dawson Tweed [birth year] [id number]                                       Medication Assistance Referral  Referral From: [place]. (Clinic RPh)  Medication/Company: Claris Che / Novo Nordisk Patient application portion:  Mailed Provider application portion: Faxed  to Dr. Johnnye Lana Provider address/fax verified via: Office website  Follow up:  Will follow up with patient in 7-10 business days to confirm application(s) have been received.  Maud Deed Chana Bode [place] Certified Pharmacy Technician [place] [facility] Direct Dial:[phone]

## 2019-04-12 ENCOUNTER — Telehealth: Payer: Self-pay

## 2019-04-12 NOTE — Telephone Encounter (Signed)
I left the pt a message that a sample of Xultophy is available for him to pickup.

## 2019-04-17 NOTE — Progress Notes (Signed)
Chronic Care Management   Initial Visit Note  04/11/2019 Name: William Mcguire MRN: [id number] DOB: 1956-02-28  Referred by: [name], MD Reason for referral : Chronic Care Management   William Mcguire is a 63 y.o. year old male who is a primary care patient of [name], MD. The CCM team was consulted for assistance with chronic disease management and care coordination needs related to HTN, HLD and DMII  Review of patient status, including review of consultants reports, relevant laboratory and other test results, and collaboration with appropriate care team members and the patient's provider was performed as part of comprehensive patient evaluation and provision of chronic care management services.    I spoke with William Mcguire by telephone today.  Advanced Directives Status: N See Care Plan and [employer] application for related entries.   Medications: Outpatient Encounter Medications as of 04/11/2019  Medication Sig  . amlodipine-atorvastatin (CADUET) 10-20 MG tablet Take 1 tablet by mouth daily.  [name] aspirin EC 81 MG tablet Take 81 mg by mouth daily.    . B-D ULTRAFINE III SHORT PEN 31G X 8 MM MISC USE AS DIRECTED  . cyanocobalamin (,VITAMIN B-12,) 1000 MCG/ML injection INJECT 1 ML INTRAMUSCULARLY WEEKLY FOR 3 WEEKS, THEN USE ONCE MONTHLY  . metFORMIN (GLUCOPHAGE) 1000 MG tablet TAKE 1 TABLET BY MOUTH TWICE A DAY WITH A MEAL  . pioglitazone (ACTOS) 15 MG tablet Take 1 tablet (15 mg total) by mouth daily.  . sildenafil (VIAGRA) 100 MG tablet TAKE 1 TABLET BY MOUTH EVERY DAY AS NEEDED  . SYRINGE-NEEDLE, DISP, 3 ML (B-D 3CC LUER-LOK SYR 25GX5/8") 25G X 5/8" 3 ML MISC Use as directed  . XULTOPHY 100-3.6 UNIT-MG/ML SOPN INJECT 20 UNITS SUBCUTANEOUSLY EVERY NIGHT  . [DISCONTINUED] Cyanocobalamin (VITAMIN B-12 IJ) Inject as directed.  . [DISCONTINUED] enalapril (VASOTEC) 5 MG tablet Take 1 tablet (5 mg total) by mouth daily.   No facility-administered encounter medications on file  as of 04/11/2019.      Objective:   Goals Addressed            This Visit's Progress     Patient Stated   . I would like to optimize my medication management of my chronic conditions (diabetes, hypertension, hyperlipidemia) (pt-stated)       Current Barriers:  . Diabetes: T2DM; most recent A1c 8.6% on 03/17/19  o Current antihyperglycemic regimen:  metformin 1g BID (LF 8/10 #90), pioglitazone 15mg  (LF 8/14 #90), Xultophy 100-3.6 (2o units qHS); Scr 1.11 . denies hypoglycemic symptoms; denies hyperglycemic symptoms . Current meal patterns: o Breakfast: eggs, bacon, toast o Lunch: sandwiches, burgers  o Supper: protein, veggie, carbs(potatoes, fries, etc) o Snacks: encouraged low sugar snacks, veggies o Drinks: recommended patient avoid sugary drinks; make water the mainstay; tea/coffee (low sugar) . Current exercise: n/a . Current blood glucose readings: FBG<150 (goal <130) . Cardiovascular risk reduction: o Current hypertensive regimen: enalapril 5mg  QD (LF 9/24 #30), amlodipine 10mg  (in combo with atorvastatin-Brand name Caduet) o Current hyperlipidemia regimen: amlodipine/atorvas 10-20mg  (LF #90 on 9/29) LDL 106  Pharmacist Clinical Goal(s):  [name] Over the next 90 days, patient with work with PharmD and primary care provider to address needs related to optimized medication management of chronic conditions.  Interventions: Call completed to patient on 04/11/2019 . Comprehensive medication review performed, medication list updated in electronic medical record . Reviewed medication fill history via insurance claims data confirming patient appears compliant with having his medications filled on time as prescribed by provider. . Reviewed & discussed  the following diabetes-related information with patient: o Follow ADA recommended "diabetes-friendly" diet  (reviewed healthy snack/food options) o Discussed insulin/GLP-1 injection technique o Reviewed medication purpose/side  effects-->patient denies adverse events .   Patient Self Care Activities:  . Patient will check blood glucose daily , document, and provide at future appointments . Patient will focus on medication adherence by continuing to take medications as prescribed; complete application for NovoNordisk patient assistance . Patient will take medications as prescribed . Patient will contact provider with any episodes of hypoglycemia . Patient will report any questions or concerns to provider   Initial goal documentation        Plan:   The care management team will reach out to the patient again over the next 2-3 weeks.  Provider Signature Regina Eck, PharmD, BCPS Clinical Pharmacist, [facility] [place]: [phone]

## 2019-04-17 NOTE — Patient Instructions (Signed)
Visit Information  Goals Addressed            This Visit's Progress     Patient Stated   . I would like to optimize my medication management of my chronic conditions (diabetes, hypertension, hyperlipidemia) (pt-stated)       Current Barriers:  . Diabetes: T2DM; most recent A1c 8.6% on 03/17/19  o Current antihyperglycemic regimen:  metformin 1g BID (LF 8/10 #90), pioglitazone 15mg  (LF 8/14 #90), Xultophy 100-3.6 (2o units qHS); Scr 1.11 . denies hypoglycemic symptoms; denies hyperglycemic symptoms . Current meal patterns: o Breakfast: eggs, bacon, toast o Lunch: sandwiches, burgers  o Supper: protein, veggie, carbs(potatoes, fries, etc) o Snacks: encouraged low sugar snacks, veggies o Drinks: recommended patient avoid sugary drinks; make water the mainstay; tea/coffee (low sugar) . Current exercise: n/a . Current blood glucose readings: FBG<150 (goal <130) . Cardiovascular risk reduction: o Current hypertensive regimen: enalapril 5mg  QD (LF 9/24 #30), amlodipine 10mg  (in combo with atorvastatin-Brand name Caduet) o Current hyperlipidemia regimen: amlodipine/atorvas 10-20mg  (LF #90 on 9/29) LDL 106  Pharmacist Clinical Goal(s):  [name] Over the next 90 days, patient with work with PharmD and primary care provider to address needs related to optimized medication management of chronic conditions.  Interventions: Call completed to patient on 04/11/2019 . Comprehensive medication review performed, medication list updated in electronic medical record . Reviewed medication fill history via insurance claims data confirming patient appears compliant with having his medications filled on time as prescribed by provider. . Reviewed & discussed the following diabetes-related information with patient: o Follow ADA recommended "diabetes-friendly" diet  (reviewed healthy snack/food options) o Discussed insulin/GLP-1 injection technique o Reviewed medication purpose/side effects-->patient denies  adverse events .   Patient Self Care Activities:  . Patient will check blood glucose daily , document, and provide at future appointments . Patient will focus on medication adherence by continuing to take medications as prescribed; complete application for NovoNordisk patient assistance . Patient will take medications as prescribed . Patient will contact provider with any episodes of hypoglycemia . Patient will report any questions or concerns to provider   Initial goal documentation        The patient verbalized understanding of instructions provided today and declined a print copy of patient instruction materials.   The care management team will reach out to the patient again over the next 2-3 weeks.  SIGNATURE Regina Eck, PharmD, BCPS Clinical Pharmacist, [facility] [place]: [phone]

## 2019-04-25 ENCOUNTER — Ambulatory Visit (INDEPENDENT_AMBULATORY_CARE_PROVIDER_SITE_OTHER): Payer: Medicare Other | Admitting: Internal Medicine

## 2019-04-25 ENCOUNTER — Other Ambulatory Visit: Payer: Self-pay | Admitting: Internal Medicine

## 2019-04-25 ENCOUNTER — Ambulatory Visit: Payer: Self-pay | Admitting: Pharmacist

## 2019-04-25 ENCOUNTER — Encounter: Payer: Self-pay | Admitting: Internal Medicine

## 2019-04-25 ENCOUNTER — Other Ambulatory Visit: Payer: Self-pay

## 2019-04-25 VITALS — BP 160/80 | HR 88 | Temp 98.1°F | Ht 72.4 in | Wt 259.2 lb

## 2019-04-25 DIAGNOSIS — E6609 Other obesity due to excess calories: Secondary | ICD-10-CM

## 2019-04-25 DIAGNOSIS — I129 Hypertensive chronic kidney disease with stage 1 through stage 4 chronic kidney disease, or unspecified chronic kidney disease: Secondary | ICD-10-CM

## 2019-04-25 DIAGNOSIS — Z23 Encounter for immunization: Secondary | ICD-10-CM

## 2019-04-25 DIAGNOSIS — Z6834 Body mass index (BMI) 34.0-34.9, adult: Secondary | ICD-10-CM

## 2019-04-25 DIAGNOSIS — N182 Chronic kidney disease, stage 2 (mild): Secondary | ICD-10-CM | POA: Diagnosis not present

## 2019-04-25 NOTE — Patient Instructions (Signed)

## 2019-04-25 NOTE — Progress Notes (Signed)
Subjective:     Patient ID: William Mcguire , male    DOB: 03-Jun-1956 , 63 y.o.   MRN: [id number]   Chief Complaint  Patient presents with  . Hypertension    HPI  He is here today for a bp check. His BP was elevated at his last visit. He reports compliance with meds. He denies chest pain, headaches, blurred vision and shortness of breath. He states he takes meds daily.     Past Medical History:  Diagnosis Date  . Diabetes mellitus   . High cholesterol   . Hypertension      Family History  Problem Relation Age of Onset  . Hypertension Mother   . Multiple myeloma Mother   . Hypertension Father   . Diabetes Father      Current Outpatient Medications:  .  amlodipine-atorvastatin (CADUET) 10-20 MG tablet, Take 1 tablet by mouth daily., Disp: 90 tablet, Rfl: 1 .  aspirin EC 81 MG tablet, Take 81 mg by mouth daily.  , Disp: , Rfl:  .  B-D ULTRAFINE III SHORT PEN 31G X 8 MM MISC, USE AS DIRECTED, Disp: 100 each, Rfl: PRN .  cyanocobalamin (,VITAMIN B-12,) 1000 MCG/ML injection, INJECT 1 ML INTRAMUSCULARLY WEEKLY FOR 3 WEEKS, THEN USE ONCE MONTHLY, Disp: 9 mL, Rfl: 3 .  enalapril (VASOTEC) 5 MG tablet, TAKE 1 TABLET BY MOUTH EVERY DAY, Disp: 30 tablet, Rfl: 1 .  metFORMIN (GLUCOPHAGE) 1000 MG tablet, TAKE 1 TABLET BY MOUTH TWICE A DAY WITH A MEAL, Disp: 180 tablet, Rfl: 0 .  pioglitazone (ACTOS) 15 MG tablet, Take 1 tablet (15 mg total) by mouth daily., Disp: 90 tablet, Rfl: 1 .  sildenafil (VIAGRA) 100 MG tablet, TAKE 1 TABLET BY MOUTH EVERY DAY AS NEEDED, Disp: 10 tablet, Rfl: 5 .  SYRINGE-NEEDLE, DISP, 3 ML (B-D 3CC LUER-LOK SYR 25GX5/8") 25G X 5/8" 3 ML MISC, Use as directed, Disp: 100 each, Rfl: 2 .  XULTOPHY 100-3.6 UNIT-MG/ML SOPN, INJECT 20 UNITS SUBCUTANEOUSLY EVERY NIGHT, Disp: 15 pen, Rfl: 5   No Known Allergies   Review of Systems  Constitutional: Negative.   Respiratory: Negative.   Cardiovascular: Negative.   Gastrointestinal: Negative.   Neurological: Negative.    Psychiatric/Behavioral: Negative.      Today's Vitals   04/25/19 1413  BP: (!) 160/80  Pulse: 88  Temp: 98.1 F (36.7 C)  TempSrc: Oral  Weight: 259 lb 3.2 oz (117.6 kg)  Height: 6' 0.4" (1.839 m)   Body mass index is 34.77 kg/m.   Objective:  Physical Exam Vitals signs and nursing note reviewed.  Constitutional:      Appearance: Normal appearance. He is obese.  Cardiovascular:     Rate and Rhythm: Normal rate and regular rhythm.     Heart sounds: Normal heart sounds.  Pulmonary:     Effort: Pulmonary effort is normal.     Breath sounds: Normal breath sounds.  Skin:    General: Skin is warm.  Neurological:     General: No focal deficit present.     Mental Status: He is alert.  Psychiatric:        Mood and Affect: Mood normal.         Assessment And Plan:     1. Hypertensive nephropathy  Uncontrolled. I will increase his enalapril to 53m - he will take 2 tabs daily until he runs out. He will rto in four weeks for re-evaluation. I will check a bmp at that time.  2. Chronic renal disease, stage II  Chronic. Importance of adequate hydration was discussed with the patient.   3. Class 1 obesity due to excess calories with serious comorbidity and body mass index (BMI) of 34.0 to 34.9 in adult  He is encouraged to strive for BMI less than 30 to decrease cardiac risk. He reports having an active lifestyle. He was commended for this, yet I encouraged him to start walking in his neighborhood - starting with 15 minutes and gradually building up to 30 minutes daily.   4. Immunization due  He was given pneumovax-23 to update his immunization history.   [name], MD    THE PATIENT IS ENCOURAGED TO PRACTICE SOCIAL DISTANCING DUE TO THE COVID-19 PANDEMIC.

## 2019-04-29 ENCOUNTER — Encounter: Payer: Self-pay | Admitting: Internal Medicine

## 2019-04-30 ENCOUNTER — Telehealth: Payer: Self-pay

## 2019-04-30 NOTE — Telephone Encounter (Signed)
Pt sent bp reading via mychart    blood pressure reading  130/78 Wednesday120/70 Friday

## 2019-05-04 ENCOUNTER — Ambulatory Visit: Payer: Self-pay

## 2019-05-04 ENCOUNTER — Telehealth: Payer: Self-pay

## 2019-05-04 DIAGNOSIS — N182 Chronic kidney disease, stage 2 (mild): Secondary | ICD-10-CM

## 2019-05-04 DIAGNOSIS — I10 Essential (primary) hypertension: Secondary | ICD-10-CM

## 2019-05-04 DIAGNOSIS — E1122 Type 2 diabetes mellitus with diabetic chronic kidney disease: Secondary | ICD-10-CM

## 2019-05-05 NOTE — Chronic Care Management (AMB) (Signed)
  Chronic Care Management   Outreach Note  05/05/2019 Name: William Mcguire MRN: [id number] DOB: 1955-07-12  Referred by: [name], MD Reason for referral : Chronic Care Management (INITIAL CCM RNCM Telephone Follow up )   An unsuccessful telephone outreach was attempted today. The patient was referred to the case management team by [name] MD for assistance with care management and care coordination.   Follow Up Plan: A HIPPA compliant phone message was left for the patient providing contact information and requesting a return call.  Telephone follow up appointment with care management team member scheduled for: 06/03/19  Barb Merino, RN, BSN, CCM Care Management Coordinator [facility]  Direct Phone: [phone] '

## 2019-05-09 ENCOUNTER — Encounter: Payer: Self-pay | Admitting: Internal Medicine

## 2019-05-10 ENCOUNTER — Other Ambulatory Visit: Payer: Self-pay | Admitting: Pharmacy Technician

## 2019-05-10 NOTE — Patient Outreach (Signed)
[name]) Care Management  05/10/2019  William Mcguire 1956/05/12 [id number]   Received patient income portion(s) of patient assistance application(s) for Xultophy. Faxed completed application and required documents into [employer].  Will follow up with company(ies) in 5-7 business days to check status of application(s).  Maud Deed Chana Bode [place] Certified Pharmacy Technician [place] Direct Dial:[phone]

## 2019-05-30 ENCOUNTER — Encounter: Payer: Self-pay | Admitting: Internal Medicine

## 2019-05-30 ENCOUNTER — Ambulatory Visit (INDEPENDENT_AMBULATORY_CARE_PROVIDER_SITE_OTHER): Payer: Medicare Other | Admitting: Internal Medicine

## 2019-05-30 ENCOUNTER — Other Ambulatory Visit: Payer: Self-pay

## 2019-05-30 VITALS — BP 136/88 | HR 71 | Temp 97.7°F | Ht 72.4 in | Wt 258.4 lb

## 2019-05-30 DIAGNOSIS — E1122 Type 2 diabetes mellitus with diabetic chronic kidney disease: Secondary | ICD-10-CM

## 2019-05-30 DIAGNOSIS — N182 Chronic kidney disease, stage 2 (mild): Secondary | ICD-10-CM | POA: Diagnosis not present

## 2019-05-30 DIAGNOSIS — E6609 Other obesity due to excess calories: Secondary | ICD-10-CM | POA: Diagnosis not present

## 2019-05-30 DIAGNOSIS — I129 Hypertensive chronic kidney disease with stage 1 through stage 4 chronic kidney disease, or unspecified chronic kidney disease: Secondary | ICD-10-CM

## 2019-05-30 DIAGNOSIS — Z6834 Body mass index (BMI) 34.0-34.9, adult: Secondary | ICD-10-CM

## 2019-05-30 MED ORDER — ENALAPRIL MALEATE 10 MG PO TABS: 10.0000 mg | ORAL_TABLET | Freq: Every day | ORAL | 2 refills | Status: DC

## 2019-05-30 NOTE — Patient Instructions (Signed)

## 2019-05-30 NOTE — Progress Notes (Signed)
This visit occurred during the SARS-CoV-2 public health emergency.  Safety protocols were in place, including screening questions prior to the visit, additional usage of staff PPE, and extensive cleaning of exam room while observing appropriate contact time as indicated for disinfecting solutions.  Subjective:     Patient ID: William Mcguire , male    DOB: 06-27-1955 , 62 y.o.   MRN: [id number]   Chief Complaint  Patient presents with  . Diabetes  . Hypertension    HPI  He is here today for dm/htn check. He reports compliance with meds. He has done well with increased dose of enalapril.   Diabetes He presents for his follow-up diabetic visit. He has type 2 diabetes mellitus. His disease course has been stable. There are no hypoglycemic associated symptoms. Pertinent negatives for diabetes include no blurred vision and no chest pain. There are no hypoglycemic complications. Diabetic complications include nephropathy. Risk factors for coronary artery disease include diabetes mellitus, dyslipidemia, hypertension, male sex and sedentary lifestyle. His breakfast blood glucose is taken between 8-9 am. His breakfast blood glucose range is generally 90-110 mg/dl.  Hypertension This is a chronic problem. The current episode started more than 1 year ago. The problem has been gradually improving since onset. The problem is controlled. Pertinent negatives include no blurred vision, chest pain, palpitations or shortness of breath.     Past Medical History:  Diagnosis Date  . Diabetes mellitus   . High cholesterol   . Hypertension      Family History  Problem Relation Age of Onset  . Hypertension Mother   . Multiple myeloma Mother   . Hypertension Father   . Diabetes Father      Current Outpatient Medications:  .  amlodipine-atorvastatin (CADUET) 10-20 MG tablet, Take 1 tablet by mouth daily., Disp: 90 tablet, Rfl: 1 .  aspirin EC 81 MG tablet, Take 81 mg by mouth daily.  , Disp: , Rfl:   .  B-D ULTRAFINE III SHORT PEN 31G X 8 MM MISC, USE AS DIRECTED, Disp: 100 each, Rfl: PRN .  cyanocobalamin (,VITAMIN B-12,) 1000 MCG/ML injection, INJECT 1 ML INTRAMUSCULARLY WEEKLY FOR 3 WEEKS, THEN USE ONCE MONTHLY (Patient taking differently: 1,000 mcg every 30 (thirty) days. ), Disp: 9 mL, Rfl: 3 .  metFORMIN (GLUCOPHAGE) 1000 MG tablet, TAKE 1 TABLET BY MOUTH TWICE A DAY WITH A MEAL, Disp: 180 tablet, Rfl: 0 .  pioglitazone (ACTOS) 15 MG tablet, Take 1 tablet (15 mg total) by mouth daily., Disp: 90 tablet, Rfl: 1 .  sildenafil (VIAGRA) 100 MG tablet, TAKE 1 TABLET BY MOUTH EVERY DAY AS NEEDED, Disp: 10 tablet, Rfl: 5 .  SYRINGE-NEEDLE, DISP, 3 ML (B-D 3CC LUER-LOK SYR 25GX5/8") 25G X 5/8" 3 ML MISC, Use as directed, Disp: 100 each, Rfl: 2 .  enalapril (VASOTEC) 10 MG tablet, Take 1 tablet (10 mg total) by mouth daily., Disp: 90 tablet, Rfl: 2 .  XULTOPHY 100-3.6 UNIT-MG/ML SOPN, INJECT 20 UNITS SUBCUTANEOUSLY EVERY NIGHT, Disp: 15 pen, Rfl: 5   No Known Allergies   Review of Systems  Constitutional: Negative.   Eyes: Negative for blurred vision.  Respiratory: Negative.  Negative for shortness of breath.   Cardiovascular: Negative.  Negative for chest pain and palpitations.  Gastrointestinal: Negative.   Neurological: Negative.   Psychiatric/Behavioral: Negative.      Today's Vitals   05/30/19 1511  BP: 136/88  Pulse: 71  Temp: 97.7 F (36.5 C)  TempSrc: Oral  Weight: 258 lb 6.4 oz (  117.2 kg)  Height: 6' 0.4" (1.839 m)  PainSc: 4   PainLoc: Knee   Body mass index is 34.66 kg/m.   Objective:  Physical Exam Vitals signs and nursing note reviewed.  Constitutional:      Appearance: Normal appearance.  Cardiovascular:     Rate and Rhythm: Normal rate and regular rhythm.     Heart sounds: Normal heart sounds.  Pulmonary:     Effort: Pulmonary effort is normal.     Breath sounds: Normal breath sounds.  Skin:    General: Skin is warm.  Neurological:     General: No  focal deficit present.     Mental Status: He is alert.  Psychiatric:        Mood and Affect: Mood normal.         Assessment And Plan:     1. Type 2 diabetes mellitus with stage 2 chronic kidney disease, without long-term current use of insulin ([place])  I will check labs as listed below.  He was also given sample of Xultophy. I plan to wean him off of pioglitazone once he runs out of his current supply. I advised him that his sugars will go up once he runs out, and that he needs to report these sugar readings to me so I can adjust the dose of his Xultophy. He is in agreement with his treatment plan and verbalizes understanding of his instructions.   - Hemoglobin A1c - CMP14+EGFR  2. Hypertensive nephropathy  Chronic, well controlled. He will continue with current meds. He is encouraged to avoid adding salt to his foods.   3. Class 1 obesity due to excess calories with serious comorbidity and body mass index (BMI) of 34.0 to 34.9 in adult  He is encouraged to strive for BMI less than 30 to decrease cardiac risk. Again encouraged to aim for at least 150 minutes of exercise per week.   William Greenland, MD    THE PATIENT IS ENCOURAGED TO PRACTICE SOCIAL DISTANCING DUE TO THE COVID-19 PANDEMIC.

## 2019-05-31 LAB — CMP14+EGFR
ALT: 20 IU/L (ref 0–44)
AST: 28 IU/L (ref 0–40)
Albumin/Globulin Ratio: 1.3 (ref 1.2–2.2)
Albumin: 4.4 g/dL (ref 3.8–4.8)
Alkaline Phosphatase: 111 IU/L (ref 39–117)
BUN/Creatinine Ratio: 10 (ref 10–24)
BUN: 13 mg/dL (ref 8–27)
Bilirubin Total: 0.3 mg/dL (ref 0.0–1.2)
CO2: 24 mmol/L (ref 20–29)
Calcium: 9.7 mg/dL (ref 8.6–10.2)
Chloride: 102 mmol/L (ref 96–106)
Creatinine, Ser: 1.33 mg/dL — ABNORMAL HIGH (ref 0.76–1.27)
GFR calc Af Amer: 64 mL/min/{1.73_m2} (ref >59)
GFR calc non Af Amer: 56 mL/min/{1.73_m2} — ABNORMAL LOW (ref >59)
Globulin, Total: 3.4 g/dL (ref 1.5–4.5)
Glucose: 148 mg/dL — ABNORMAL HIGH (ref 65–99)
Potassium: 4.2 mmol/L (ref 3.5–5.2)
Sodium: 138 mmol/L (ref 134–144)
Total Protein: 7.8 g/dL (ref 6.0–8.5)

## 2019-05-31 LAB — HEMOGLOBIN A1C
Est. average glucose Bld gHb Est-mCnc: 174 mg/dL
Hgb A1c MFr Bld: 7.7 % — ABNORMAL HIGH (ref 4.8–5.6)

## 2019-06-01 ENCOUNTER — Ambulatory Visit: Payer: Self-pay | Admitting: Pharmacist

## 2019-06-01 DIAGNOSIS — E1122 Type 2 diabetes mellitus with diabetic chronic kidney disease: Secondary | ICD-10-CM

## 2019-06-01 DIAGNOSIS — N182 Chronic kidney disease, stage 2 (mild): Secondary | ICD-10-CM

## 2019-06-03 ENCOUNTER — Ambulatory Visit: Payer: Self-pay

## 2019-06-03 ENCOUNTER — Telehealth: Payer: Self-pay

## 2019-06-03 DIAGNOSIS — N182 Chronic kidney disease, stage 2 (mild): Secondary | ICD-10-CM

## 2019-06-03 DIAGNOSIS — E1122 Type 2 diabetes mellitus with diabetic chronic kidney disease: Secondary | ICD-10-CM

## 2019-06-03 DIAGNOSIS — I10 Essential (primary) hypertension: Secondary | ICD-10-CM

## 2019-06-06 ENCOUNTER — Other Ambulatory Visit: Payer: Self-pay

## 2019-06-06 ENCOUNTER — Encounter: Payer: Self-pay | Admitting: Family Medicine

## 2019-06-06 ENCOUNTER — Telehealth: Payer: Self-pay

## 2019-06-06 ENCOUNTER — Ambulatory Visit: Payer: Medicare Other | Admitting: Family Medicine

## 2019-06-06 ENCOUNTER — Other Ambulatory Visit: Payer: Self-pay | Admitting: Pharmacy Technician

## 2019-06-06 DIAGNOSIS — M25562 Pain in left knee: Secondary | ICD-10-CM

## 2019-06-06 DIAGNOSIS — M1712 Unilateral primary osteoarthritis, left knee: Secondary | ICD-10-CM | POA: Diagnosis not present

## 2019-06-06 DIAGNOSIS — M1711 Unilateral primary osteoarthritis, right knee: Secondary | ICD-10-CM

## 2019-06-06 NOTE — Progress Notes (Signed)
William Mcguire - 63 y.o. male MRN [id number]  Date of birth: [birth year]  Office Visit Note: Visit Date: 06/06/2019 PCP: [name], MD Referred by: [name], MD  Subjective: Chief Complaint  Patient presents with  . Left Knee - Pain    Pain returned about 2 months again - "exact same pain as before (the cortisone injection 02/14/19)."   HPI: William Mcguire is a 63 y.o. male who comes in today with chronic left knee pain.  He was last seen by Dr. Marlou Sa on 02/14/19 for bilateral knee arthritis, worse on left side. He had steroid injection of left knee that helped with pain for about 2 months. He reports stiffness and swelling in left knee with no mechanical symptoms. He says that he has swelling of pain that hurt with walking up and down stairs but it improved today and he is currently walking without pain. He would prefer to wait for viscous supplementation to be approved rather than repeat steroid injection today.  Assessment & Plan: Visit Diagnoses:  1. Unilateral primary osteoarthritis, left knee   2. Acute pain of left knee     Plan: Will work on prior authorization for visco supplementation. Will call patient when it is available to come in for injection of left knee.    Follow-up: return for visco injection  Procedures: No procedures performed  No notes on file   Clinical History: No specialty comments available.   He reports that he has never smoked. He has never used smokeless tobacco.  Recent Labs    11/24/18 1552 03/17/19 0901 05/30/19 1531  HGBA1C 9.3* 8.6* 7.7*    Objective:  VS:  HT:    WT:   BMI:     BP:   HR: bpm  TEMP: ( )  RESP:  Physical Exam  PHYSICAL EXAM: Gen: NAD, alert, cooperative with exam, well-appearing HEENT: clear conjunctiva,  CV:  no edema, capillary refill brisk, normal rate Resp: non-labored Skin: no rashes, normal turgor  Neuro: no gross deficits.  Psych:  alert and oriented  Ortho Exam  Left Knee: - Inspection:  1+ effusion, erythema or bruising. Skin intact - Palpation: TTP over medial joint line, crepitus appreciated with movement - ROM: 3+ crepitus with ROM. full active ROM with flexion and extension in knee and hip - Strength: 5/5 strength - Neuro/vasc: NV intact   Imaging: No results found.  Past Medical/Family/Surgical/Social History: Medications & Allergies reviewed per EMR, new medications updated. Patient Active Problem List   Diagnosis Date Noted  . Hypertensive nephropathy 08/05/2018  . Class 1 obesity due to excess calories with serious comorbidity and body mass index (BMI) of 32.0 to 32.9 in adult 08/05/2018  . Pyelonephritis 06/23/2011  . AKI (acute kidney injury) ([place]) 06/23/2011  . Diabetes mellitus 06/23/2011  . HTN (hypertension) 06/23/2011   Past Medical History:  Diagnosis Date  . Diabetes mellitus   . High cholesterol   . Hypertension    Family History  Problem Relation Age of Onset  . Hypertension Mother   . Multiple myeloma Mother   . Hypertension Father   . Diabetes Father    Past Surgical History:  Procedure Laterality Date  . KNEE SURGERY     torn cartilage  . PATELLAR TENDON REPAIR  06/11/2011   Procedure: PATELLA TENDON REPAIR;  Surgeon: Meredith Pel;  Location: [facility] ORS;  Service: Orthopedics;  Laterality: Right;   Social History   Occupational History  . Not on file  Tobacco Use  .  Smoking status: Never Smoker  . Smokeless tobacco: Never Used  Substance and Sexual Activity  . Alcohol use: No  . Drug use: No  . Sexual activity: Not on file

## 2019-06-06 NOTE — Progress Notes (Signed)
I saw and examined the patient with Dr. Mayer Masker and agree with assessment and plan as outlined.    Left knee pain with medial compartment DJD.  Wants to proceed with gel injections, so will request approval.

## 2019-06-06 NOTE — Patient Outreach (Signed)
[facility]) Care Management  06/06/2019  Behruz [place] 03-Mar-1956 [id number]    Follow up call placed to [employer] regarding patient assistance application(s) for Benjamine Sprague confirms that patient has been denied due to being over income.  Follow up:  Will route note to [place] to inform and for case closure.  Maud Deed Chana Bode [place] Certified Pharmacy Technician [place] [facility] Direct [phone]

## 2019-06-06 NOTE — Progress Notes (Signed)
  Chronic Care Management   Outreach Note  06/01/2019 Name: William Mcguire MRN: [id number] DOB: Sep 13, 1955  Referred by: [name], MD Reason for referral : Chronic Care Management   An unsuccessful telephone outreach was attempted today. The patient was referred to the case management team by for assistance with care management and care coordination.   Follow Up Plan: A HIPPA compliant phone message was left for the patient providing contact information and requesting a return call.  The care management team will reach out to the patient again over the next 7-10 days.   SIGNATURE Regina Eck, PharmD, BCPS Clinical Pharmacist, [facility] [place]: [phone]'

## 2019-06-07 ENCOUNTER — Ambulatory Visit (INDEPENDENT_AMBULATORY_CARE_PROVIDER_SITE_OTHER): Payer: Medicare Other | Admitting: Pharmacist

## 2019-06-07 DIAGNOSIS — N182 Chronic kidney disease, stage 2 (mild): Secondary | ICD-10-CM | POA: Diagnosis not present

## 2019-06-07 DIAGNOSIS — E1122 Type 2 diabetes mellitus with diabetic chronic kidney disease: Secondary | ICD-10-CM

## 2019-06-07 NOTE — Patient Instructions (Signed)
Visit Information  Goals Addressed      Patient Stated   . "I would like to continue to work on lowering my A1C" (pt-stated)       Current Barriers:  [name] Knowledge Deficits related to Diabetes disease process and Self Health managment . Chronic Disease Management support and education needs related to Diabetes Mellitus  Nurse Case Manager Clinical Goal(s):  [name] Over the next 90 days, patient will work with the CCM team to address needs related to Diabetes disease management , support and education to help improve his daily glycemic control and lower his A1C . Over the next 90 days, patient will lower his A1C <7.7  CCM RN CM Interventions:  06/06/19 call completed with patient   . Evaluation of current treatment plan related to Diabetes and patient's adherence to plan as established by provider. . Provided education to patient re: most recent A1C of 7.7 obtained on 05/30/19; educated on target A1C of <7.0; education on importance or adhering to following a Diabetic friendly diet and implementing exercise into his daily routine; educated patient on target daily glycemic control 80-130  . Reviewed medications with patient and discussed patient is adhering to his prescribed treatment plan; Metformin, Xultophy . Discussed plans with patient for ongoing care management follow up and provided patient with direct contact information for care management team . Provided patient with printed educational materials related to Diabetes Management Safety Zone Tool, Managing Your Diabetes using the Plate Method; Carb Counting, Carb Choices; Know Your A1C; signs/symptoms of Hypo/Hyperglycemia . Advised patient, providing education and rationale, to check cbg daily before meals and record, calling the CCM team and or PCP for findings outside established parameters.    Patient Self Care Activities:  . Patient verbalizes understanding of plan to reviewed printed Diabetes education materials sent via mail and review  with CCM RN CM at next scheduled follow up call  . Self administers medications as prescribed . Attends all scheduled provider appointments . Calls pharmacy for medication refills . Performs ADL's independently . Performs IADL's independently . Calls provider office for new concerns or questions   Initial goal documentation     . "I would like to keep my BP under good control" (pt-stated)       Current Barriers:  [name] Knowledge Deficits related to disease process and Self Health management for Hypertension . Chronic Disease Management support and education needs related to Hypertension  Nurse Case Manager Clinical Goal(s):  [name] Over the next 90 days, patient will work with the CCM team and PCP to address needs related to Hypertension   CCM RN CM Interventions:  06/06/19 call completed with patient   . Evaluation of current treatment plan related to Hypertension  and patient's adherence to plan as established by provider. . Provided education to patient re: target BP of 130/80; discussed importance of avoiding table salt, canned foods and fast food  . Reviewed medications with patient and discussed patient is adhering to his prescribed regimen; Enalapril, Amlodipine . Discussed plans with patient for ongoing care management follow up and provided patient with direct contact information for care management team . Advised patient, providing education and rationale, to monitor blood pressure daily and record, calling the CCM and or PCP for findings outside established parameters.  . Provided patient with printed  educational materials related to What is High Blood Pressure; African-Americans and High Blood Pressure; Why should I restrict my Sodium; Life's Simple 7  Patient Self Care Activities:  . Patient  verbalizes understanding of plan to reviewed printed education materials related to High Blood Pressure to review with CCM RN CM at next scheduled follow up call  . Self administers  medications as prescribed . Attends all scheduled provider appointments . Calls pharmacy for medication refills . Performs ADL's independently . Performs IADL's independently . Calls provider office for new concerns or questions   Initial goal documentation        The patient verbalized understanding of instructions provided today and declined a print copy of patient instruction materials.   Telephone follow up appointment with care management team member scheduled for: 07/21/19  Barb Merino, RN, BSN, CCM Care Management Coordinator [facility] [facility]  Direct Phone: [phone]

## 2019-06-07 NOTE — Progress Notes (Signed)
Will submit on for VOB after 06/27/2019 due to insurances not verifying benefits until 06/27/2019.

## 2019-06-07 NOTE — Chronic Care Management (AMB) (Signed)
Chronic Care Management   Initial Visit Note  06/06/2019 Name: William Mcguire MRN: [id number] DOB: [birth year]  Referred by: [name], MD Reason for referral : Chronic Care Management (Initial CCM RNCM Telephone Outreach )   William Mcguire is a 63 y.o. year old male who is a primary care patient of [name], MD. The CCM team was consulted for assistance with chronic disease management and care coordination needs related to HTN and DMII  Review of patient status, including review of consultants reports, relevant laboratory and other test results, and collaboration with appropriate care team members and the patient's provider was performed as part of comprehensive patient evaluation and provision of chronic care management services.    SDOH (Social Determinants of Health) screening performed today: None. See Care Plan for related entries.   Placed initial CCM RN CM outbound call to patient and and a care plan was established.   Medications: Outpatient Encounter Medications as of 06/03/2019  Medication Sig  . amlodipine-atorvastatin (CADUET) 10-20 MG tablet Take 1 tablet by mouth daily.  [name] aspirin EC 81 MG tablet Take 81 mg by mouth daily.    . B-D ULTRAFINE III SHORT PEN 31G X 8 MM MISC USE AS DIRECTED  . cyanocobalamin (,VITAMIN B-12,) 1000 MCG/ML injection INJECT 1 ML INTRAMUSCULARLY WEEKLY FOR 3 WEEKS, THEN USE ONCE MONTHLY (Patient taking differently: 1,000 mcg every 30 (thirty) days. )  . enalapril (VASOTEC) 10 MG tablet Take 1 tablet (10 mg total) by mouth daily.  . metFORMIN (GLUCOPHAGE) 1000 MG tablet TAKE 1 TABLET BY MOUTH TWICE A DAY WITH A MEAL  . pioglitazone (ACTOS) 15 MG tablet Take 1 tablet (15 mg total) by mouth daily.  . sildenafil (VIAGRA) 100 MG tablet TAKE 1 TABLET BY MOUTH EVERY DAY AS NEEDED  . SYRINGE-NEEDLE, DISP, 3 ML (B-D 3CC LUER-LOK SYR 25GX5/8") 25G X 5/8" 3 ML MISC Use as directed  . XULTOPHY 100-3.6 UNIT-MG/ML SOPN INJECT 20 UNITS  SUBCUTANEOUSLY EVERY NIGHT   No facility-administered encounter medications on file as of 06/03/2019.     Objective:  Lab Results  Component Value Date   HGBA1C 7.7 (H) 05/30/2019   HGBA1C 8.6 (H) 03/17/2019   HGBA1C 9.3 (H) 11/24/2018   Lab Results  Component Value Date   MICROALBUR 80 03/17/2019   LDLCALC 106 (H) 11/24/2018   CREATININE 1.33 (H) 05/30/2019   BP Readings from Last 3 Encounters:  05/30/19 136/88  04/25/19 (!) 160/80  03/30/19 (!) 162/88    Goals Addressed      Patient Stated   . "I would like to continue to work on lowering my A1C" (pt-stated)       Current Barriers:  [name] Knowledge Deficits related to Diabetes disease process and Self Health managment . Chronic Disease Management support and education needs related to Diabetes Mellitus  Nurse Case Manager Clinical Goal(s):  [name] Over the next 90 days, patient will work with the CCM team to address needs related to Diabetes disease management , support and education to help improve his daily glycemic control and lower his A1C . Over the next 90 days, patient will lower his A1C <7.7  CCM RN CM Interventions:  06/06/19 call completed with patient   . Evaluation of current treatment plan related to Diabetes and patient's adherence to plan as established by provider. . Provided education to patient re: most recent A1C of 7.7 obtained on 05/30/19; educated on target A1C of <7.0; education on importance or adhering to following a Diabetic friendly  diet and implementing exercise into his daily routine; educated patient on target daily glycemic control 80-130  . Reviewed medications with patient and discussed patient is adhering to his prescribed treatment plan; Metformin, Xultophy . Discussed plans with patient for ongoing care management follow up and provided patient with direct contact information for care management team . Provided patient with printed educational materials related to Diabetes Management Safety Zone  Tool, Managing Your Diabetes using the Plate Method; Carb Counting, Carb Choices; Know Your A1C; signs/symptoms of Hypo/Hyperglycemia . Advised patient, providing education and rationale, to check cbg daily before meals and record, calling the CCM team and or PCP for findings outside established parameters.    Patient Self Care Activities:  . Patient verbalizes understanding of plan to reviewed printed Diabetes education materials sent via mail and review with CCM RN CM at next scheduled follow up call  . Self administers medications as prescribed . Attends all scheduled provider appointments . Calls pharmacy for medication refills . Performs ADL's independently . Performs IADL's independently . Calls provider office for new concerns or questions   Initial goal documentation     . "I would like to keep my BP under good control" (pt-stated)       Current Barriers:  [name] Knowledge Deficits related to disease process and Self Health management for Hypertension . Chronic Disease Management support and education needs related to Hypertension  Nurse Case Manager Clinical Goal(s):  [name] Over the next 90 days, patient will work with the CCM team and PCP to address needs related to Hypertension   CCM RN CM Interventions:  06/06/19 call completed with patient   . Evaluation of current treatment plan related to Hypertension  and patient's adherence to plan as established by provider. . Provided education to patient re: target BP of 130/80; discussed importance of avoiding table salt, canned foods and fast food  . Reviewed medications with patient and discussed patient is adhering to his prescribed regimen; Enalapril, Amlodipine . Discussed plans with patient for ongoing care management follow up and provided patient with direct contact information for care management team . Advised patient, providing education and rationale, to monitor blood pressure daily and record, calling the CCM and or PCP for  findings outside established parameters.  . Provided patient with printed  educational materials related to What is High Blood Pressure; African-Americans and High Blood Pressure; Why should I restrict my Sodium; Life's Simple 7  Patient Self Care Activities:  . Patient verbalizes understanding of plan to reviewed printed education materials related to High Blood Pressure to review with CCM RN CM at next scheduled follow up call  . Self administers medications as prescribed . Attends all scheduled provider appointments . Calls pharmacy for medication refills . Performs ADL's independently . Performs IADL's independently . Calls provider office for new concerns or questions   Initial goal documentation        Plan:   Telephone follow up appointment with care management team member scheduled for: 07/21/19  Barb Merino, RN, BSN, CCM Care Management Coordinator [facility]  Direct Phone: [phone]

## 2019-06-13 NOTE — Patient Instructions (Signed)
Visit Information  Goals Addressed            This Visit's Progress     Patient Stated   . I would like to optimize my medication management of my chronic conditions (diabetes, hypertension, hyperlipidemia) (pt-stated)       Current Barriers:  . Diabetes: T2DM; most recent A1c 8.6% on 03/17/19  o Current antihyperglycemic regimen:  metformin 1g BID (LF 8/10 #90), pioglitazone 15mg  (LF 8/14 #90), Xultophy 100-3.6 (2o units qHS); Scr 1.11 o Patient denied from [facility] patient assistance program-->will apply for Basal/GLP-1 regiment through [employer] (Trulicity/[name]) . denies hypoglycemic symptoms; denies hyperglycemic symptoms . Current meal patterns: o Breakfast: eggs, bacon, toast o Lunch: sandwiches, burgers  o Supper: protein, veggie, carbs(potatoes, fries, etc) o Snacks: encouraged low sugar snacks, veggies o Drinks: recommended patient avoid sugary drinks; make water the mainstay; tea/coffee (low sugar) . Current exercise: n/a . Current blood glucose readings: FBG<150 (goal <130) . Cardiovascular risk reduction: o Current hypertensive regimen: enalapril 5mg  QD (LF 9/24 #30), amlodipine 10mg  (in combo with atorvastatin-Brand name Caduet) o Current hyperlipidemia regimen: amlodipine/atorvas 10-20mg  (LF #90 on 9/29) LDL 106  Pharmacist Clinical Goal(s):  [name] Over the next 90 days, patient with work with PharmD and primary care provider to address needs related to optimized medication management of chronic conditions.  Interventions: Call completed to patient on 06/04/2019 . Comprehensive medication review performed, medication list updated in electronic medical record . Reviewed medication fill history via insurance claims data confirming patient appears compliant with having his medications filled on time as prescribed by provider. . Reviewed & discussed the following diabetes-related information with patient: o Follow ADA recommended "diabetes-friendly" diet  (reviewed  healthy snack/food options) o Discussed insulin/GLP-1 injection technique o Reviewed medication purpose/side effects-->patient denies adverse events .   Patient Self Care Activities:  . Patient will check blood glucose daily , document, and provide at future appointments . Patient will focus on medication adherence by continuing to take medications as prescribed; complete application for NovoNordisk patient assistance . Patient will take medications as prescribed . Patient will contact provider with any episodes of hypoglycemia . Patient will report any questions or concerns to provider   Initial goal documentation        The patient verbalized understanding of instructions provided today and declined a print copy of patient instruction materials.   The care management team will reach out to the patient again over the next 30 days.   SIGNATURE Regina Eck, PharmD, BCPS Clinical Pharmacist, [facility] [place]: [phone]

## 2019-06-13 NOTE — Progress Notes (Signed)
Chronic Care Management    Visit Note  06/07/2019 Name: William Mcguire MRN: [id number] DOB: 1955/06/30  Referred by: [name], MD Reason for referral : Chronic Care Management   William Mcguire is a 63 y.o. year old male who is a primary care patient of [name], MD. The CCM team was consulted for assistance with chronic disease management and care coordination needs related to DMII  Review of patient status, including review of consultants reports, relevant laboratory and other test results, and collaboration with appropriate care team members and the patient's provider was performed as part of comprehensive patient evaluation and provision of chronic care management services.    I spoke with William Mcguire by telephone today.  Medications: Outpatient Encounter Medications as of 06/07/2019  Medication Sig  . amlodipine-atorvastatin (CADUET) 10-20 MG tablet Take 1 tablet by mouth daily.  [name] aspirin EC 81 MG tablet Take 81 mg by mouth daily.    . B-D ULTRAFINE III SHORT PEN 31G X 8 MM MISC USE AS DIRECTED  . cyanocobalamin (,VITAMIN B-12,) 1000 MCG/ML injection INJECT 1 ML INTRAMUSCULARLY WEEKLY FOR 3 WEEKS, THEN USE ONCE MONTHLY (Patient taking differently: 1,000 mcg every 30 (thirty) days. )  . enalapril (VASOTEC) 10 MG tablet Take 1 tablet (10 mg total) by mouth daily.  . metFORMIN (GLUCOPHAGE) 1000 MG tablet TAKE 1 TABLET BY MOUTH TWICE A DAY WITH A MEAL  . pioglitazone (ACTOS) 15 MG tablet Take 1 tablet (15 mg total) by mouth daily.  . sildenafil (VIAGRA) 100 MG tablet TAKE 1 TABLET BY MOUTH EVERY DAY AS NEEDED  . SYRINGE-NEEDLE, DISP, 3 ML (B-D 3CC LUER-LOK SYR 25GX5/8") 25G X 5/8" 3 ML MISC Use as directed  . XULTOPHY 100-3.6 UNIT-MG/ML SOPN INJECT 20 UNITS SUBCUTANEOUSLY EVERY NIGHT   No facility-administered encounter medications on file as of 06/07/2019.     Objective:   Goals Addressed            This Visit's Progress     Patient Stated   . I would like  to optimize my medication management of my chronic conditions (diabetes, hypertension, hyperlipidemia) (pt-stated)       Current Barriers:  . Diabetes: T2DM; most recent A1c 8.6% on 03/17/19  o Current antihyperglycemic regimen:  metformin 1g BID (LF 8/10 #90), pioglitazone 15mg  (LF 8/14 #90), Xultophy 100-3.6 (2o units qHS); Scr 1.11 o Patient denied from [facility] patient assistance program-->will apply for Basal/GLP-1 regiment through [employer] (Trulicity/[name]) . denies hypoglycemic symptoms; denies hyperglycemic symptoms . Current meal patterns: o Breakfast: eggs, bacon, toast o Lunch: sandwiches, burgers  o Supper: protein, veggie, carbs(potatoes, fries, etc) o Snacks: encouraged low sugar snacks, veggies o Drinks: recommended patient avoid sugary drinks; make water the mainstay; tea/coffee (low sugar) . Current exercise: n/a . Current blood glucose readings: FBG<150 (goal <130) . Cardiovascular risk reduction: o Current hypertensive regimen: enalapril 5mg  QD (LF 9/24 #30), amlodipine 10mg  (in combo with atorvastatin-Brand name Caduet) o Current hyperlipidemia regimen: amlodipine/atorvas 10-20mg  (LF #90 on 9/29) LDL 106  Pharmacist Clinical Goal(s):  [name] Over the next 90 days, patient with work with PharmD and primary care provider to address needs related to optimized medication management of chronic conditions.  Interventions: Call completed to patient on 06/07/2019 . Comprehensive medication review performed, medication list updated in electronic medical record . Reviewed medication fill history via insurance claims data confirming patient appears compliant with having his medications filled on time as prescribed by provider. . Reviewed & discussed the following diabetes-related information  with patient: o Follow ADA recommended "diabetes-friendly" diet  (reviewed healthy snack/food options) o Discussed insulin/GLP-1 injection technique o Reviewed medication purpose/side  effects-->patient denies adverse events .   Patient Self Care Activities:  . Patient will check blood glucose daily , document, and provide at future appointments . Patient will focus on medication adherence by continuing to take medications as prescribed; complete application for NovoNordisk patient assistance . Patient will take medications as prescribed . Patient will contact provider with any episodes of hypoglycemia . Patient will report any questions or concerns to provider   Initial goal documentation           Plan:   The care management team will reach out to the patient again over the next 30 days.   Provider Signature Regina Eck, PharmD, BCPS Clinical Pharmacist, [facility] [place]: [phone]

## 2019-06-15 ENCOUNTER — Ambulatory Visit: Payer: Medicare Other | Admitting: Internal Medicine

## 2019-06-20 ENCOUNTER — Ambulatory Visit: Payer: Self-pay | Admitting: Pharmacist

## 2019-06-20 DIAGNOSIS — N182 Chronic kidney disease, stage 2 (mild): Secondary | ICD-10-CM

## 2019-06-20 DIAGNOSIS — I10 Essential (primary) hypertension: Secondary | ICD-10-CM

## 2019-06-20 DIAGNOSIS — E1122 Type 2 diabetes mellitus with diabetic chronic kidney disease: Secondary | ICD-10-CM

## 2019-06-20 NOTE — Progress Notes (Signed)
Chronic Care Management   Visit Note  06/20/2019 Name: William Mcguire MRN: [id number] DOB: 08-20-1955  Referred by: [name], MD Reason for referral : Chronic Care Management   William Mcguire is a 63 y.o. year old male who is a primary care patient of [name], MD. The CCM team was consulted for assistance with chronic disease management and care coordination needs related to DMII  Review of patient status, including review of consultants reports, relevant laboratory and other test results, and collaboration with appropriate care team members and the patient's provider was performed as part of comprehensive patient evaluation and provision of chronic care management services.    I spoke with William Mcguire by telephone today  Medications: Outpatient Encounter Medications as of 06/20/2019  Medication Sig  . aspirin EC 81 MG tablet Take 81 mg by mouth daily.    . B-D ULTRAFINE III SHORT PEN 31G X 8 MM MISC USE AS DIRECTED  . cyanocobalamin (,VITAMIN B-12,) 1000 MCG/ML injection INJECT 1 ML INTRAMUSCULARLY WEEKLY FOR 3 WEEKS, THEN USE ONCE MONTHLY (Patient taking differently: 1,000 mcg every 30 (thirty) days. )  . enalapril (VASOTEC) 10 MG tablet Take 1 tablet (10 mg total) by mouth daily.  . metFORMIN (GLUCOPHAGE) 1000 MG tablet TAKE 1 TABLET BY MOUTH TWICE A DAY WITH A MEAL  . pioglitazone (ACTOS) 15 MG tablet Take 1 tablet (15 mg total) by mouth daily.  . sildenafil (VIAGRA) 100 MG tablet TAKE 1 TABLET BY MOUTH EVERY DAY AS NEEDED  . SYRINGE-NEEDLE, DISP, 3 ML (B-D 3CC LUER-LOK SYR 25GX5/8") 25G X 5/8" 3 ML MISC Use as directed  . [DISCONTINUED] amlodipine-atorvastatin (CADUET) 10-20 MG tablet Take 1 tablet by mouth daily.  . [DISCONTINUED] XULTOPHY 100-3.6 UNIT-MG/ML SOPN INJECT 20 UNITS SUBCUTANEOUSLY EVERY NIGHT   No facility-administered encounter medications on file as of 06/20/2019.     Objective:   Goals Addressed            This Visit's Progress     Patient Stated   . I would like to optimize my medication management of my chronic conditions (diabetes, hypertension, hyperlipidemia) (pt-stated)       Current Barriers:  . Diabetes: T2DM; most recent A1c 8.6% on 03/17/19  o Current antihyperglycemic regimen:  metformin 1g BID (LF 8/10 #90), pioglitazone 15mg  (LF 8/14 #90), Xultophy 100-3.6 (2o units qHS); Scr 1.11 o Patient denied from [facility] patient assistance program-->will apply for Basal/GLP-1 regiment through [employer] (Trulicity/[name]) o Clinic visit scheduled for 06/27/2019 . Denies hypoglycemic symptoms; denies hyperglycemic symptoms . Current meal patterns: o Breakfast: eggs, bacon, toast o Lunch: sandwiches, burgers  o Supper: protein, veggie, carbs(potatoes, fries, etc) o Snacks: encouraged low sugar snacks, veggies o Drinks: recommended patient avoid sugary drinks; make water the mainstay; tea/coffee (low sugar) . Current exercise: n/a . Current blood glucose readings: FBG<150 (goal <130) . Cardiovascular risk reduction: o Current hypertensive regimen: enalapril 5mg  QD (LF 9/24 #30), amlodipine 10mg  (in combo with atorvastatin-Brand name Caduet) o Current hyperlipidemia regimen: amlodipine/atorvas 10-20mg  (LF #90 on 9/29) LDL 106  Pharmacist Clinical Goal(s):  [name] Over the next 90 days, patient with work with PharmD and primary care provider to address needs related to optimized medication management of chronic conditions.  Interventions: Call completed to patient on 06/20/2019 . Comprehensive medication review performed, medication list updated in electronic medical record . Reviewed medication fill history via insurance claims data confirming patient appears compliant with having his medications filled on time as prescribed by provider. . Reviewed &  discussed the following diabetes-related information with patient: o Follow ADA recommended "diabetes-friendly" diet  (reviewed healthy snack/food options) o Discussed  insulin/GLP-1 injection technique o Reviewed medication purpose/side effects-->patient denies adverse events .   Patient Self Care Activities:  . Patient will check blood glucose daily , document, and provide at future appointments . Patient will focus on medication adherence by continuing to take medications as prescribed; complete application for NovoNordisk patient assistance . Patient will take medications as prescribed . Patient will contact provider with any episodes of hypoglycemia . Patient will report any questions or concerns to provider   Please see past updates related to this goal by clicking on the "Past Updates" button in the selected goal          Plan:   Face to Face appointment with care management team member scheduled for: 06/27/2019  Provider Signature Regina Eck, PharmD, BCPS Clinical Pharmacist, [facility] [place]: [phone]

## 2019-06-22 ENCOUNTER — Other Ambulatory Visit: Payer: Self-pay | Admitting: Internal Medicine

## 2019-06-27 ENCOUNTER — Telehealth: Payer: Self-pay | Admitting: Pharmacist

## 2019-06-27 DIAGNOSIS — E1122 Type 2 diabetes mellitus with diabetic chronic kidney disease: Secondary | ICD-10-CM

## 2019-06-27 DIAGNOSIS — N182 Chronic kidney disease, stage 2 (mild): Secondary | ICD-10-CM

## 2019-06-27 DIAGNOSIS — I10 Essential (primary) hypertension: Secondary | ICD-10-CM

## 2019-06-30 ENCOUNTER — Other Ambulatory Visit: Payer: Self-pay | Admitting: Pharmacy Technician

## 2019-06-30 NOTE — Patient Outreach (Signed)
[place] [facility] [facility]) Care Management  06/30/2019  Tyreq Balough 07/03/1955 [id number]   Received provider portion(s) of patient assistance application(s) for Trul[employer] and [employer]. Faxed completed application and required documents into [employer].  Will follow up with company(ies) in 7-10 business days to check status of application(s).  Maud Deed Chana Bode [place] Certified Pharmacy Technician [place] [facility] Direct [phone]

## 2019-07-04 ENCOUNTER — Telehealth: Payer: Self-pay

## 2019-07-04 ENCOUNTER — Other Ambulatory Visit: Payer: Self-pay

## 2019-07-04 ENCOUNTER — Other Ambulatory Visit: Payer: Self-pay | Admitting: Nurse Practitioner

## 2019-07-04 DIAGNOSIS — U071 COVID-19: Secondary | ICD-10-CM | POA: Diagnosis not present

## 2019-07-04 MED ORDER — AZITHROMYCIN 250 MG PO TABS: ORAL_TABLET | ORAL | 0 refills | Status: AC

## 2019-07-04 MED ORDER — OSELTAMIVIR PHOSPHATE 75 MG PO CAPS: 75.0000 mg | ORAL_CAPSULE | Freq: Two times a day (BID) | ORAL | 0 refills | Status: DC

## 2019-07-04 NOTE — Telephone Encounter (Signed)
Submitted VOB for SynviscOne, bilateral knee. 

## 2019-07-05 ENCOUNTER — Other Ambulatory Visit: Payer: Medicare Other

## 2019-07-05 ENCOUNTER — Telehealth: Payer: Self-pay

## 2019-07-05 NOTE — Telephone Encounter (Signed)
Approved for SynviscOne, bilateral knee. [place] Patient will be responsible for 20% OOP. Possible Co-pay of $45.00 No PA required   Appt. 07/14/2019 with Dr. Junius Roads

## 2019-07-07 ENCOUNTER — Other Ambulatory Visit: Payer: Self-pay | Admitting: Pharmacy Technician

## 2019-07-07 NOTE — Patient Outreach (Signed)
[place] [facility]) Care Management  07/07/2019  William Mcguire 06-30-1955 [id number]   Follow up call placed to [facility] regarding patient assistance application(s) for Trul[employer] and [name] , Seth Bake confirms patient has been approved as of 1/12 until 06/22/2020. Patient needs to contact [place] to set up shipping of medications.   Follow up:  Informed [facility] Embedded RPh [name] of above information, will remove myself from care team.  [name] Certified Pharmacy Technician [place] [facility] Direct Dial:[phone]

## 2019-07-13 ENCOUNTER — Telehealth: Payer: Self-pay

## 2019-07-14 ENCOUNTER — Ambulatory Visit: Payer: Self-pay

## 2019-07-14 ENCOUNTER — Ambulatory Visit: Payer: Medicare Other | Admitting: Family Medicine

## 2019-07-14 ENCOUNTER — Other Ambulatory Visit: Payer: Self-pay

## 2019-07-14 ENCOUNTER — Encounter: Payer: Self-pay | Admitting: Family Medicine

## 2019-07-14 DIAGNOSIS — M1711 Unilateral primary osteoarthritis, right knee: Secondary | ICD-10-CM

## 2019-07-14 DIAGNOSIS — M1712 Unilateral primary osteoarthritis, left knee: Secondary | ICD-10-CM | POA: Diagnosis not present

## 2019-07-14 NOTE — Progress Notes (Signed)
   Office Visit Note   Patient: William Mcguire           Date of Birth: 07/06/1955           MRN: [id number] Visit Date: 07/14/2019 Requested by: Sanders, Robyn, MD [address] [place],  [place] [address] PCP: Sanders, Robyn, MD  Subjective: Chief Complaint  Patient presents with  . Bilateral knee Synvisc One injections    HPI: He is here for bilateral Synvisc 1 injections for knee osteoarthritis.  Cortisone injections are only giving short-term relief.  He would like to avoid knee replacement for a while longer.              ROS:   All other systems were reviewed and are negative.  Objective: Vital Signs: There were no vitals taken for this visit.  Physical Exam:  General:  Alert and oriented, in no acute distress. Pulm:  Breathing unlabored. Psy:  Normal mood, congruent affect. Skin: No erythema or rash. Knees: No significant effusion in either knee today.  Imaging: None other than for needle guidance  Assessment & Plan: 1.  Bilateral knee osteoarthritis -Synvisc 1 in both knees today.  We will use ultrasound to guide needle placement to confirm intra-articular delivery.  We can repeat this in 6 months if needed.     Procedures: Bilateral ultrasound-guided knee injections: After sterile prep with Betadine, injected 3 cc 1% lidocaine without epinephrine and Synvisc 1 from lateral midpatellar approach in the right knee and medial midpatellar approach in the left, passing the needle into the lateral and medial joint recesses respectively.    PMFS History: Patient Active Problem List   Diagnosis Date Noted  . Hypertensive nephropathy 08/05/2018  . Class 1 obesity due to excess calories with serious comorbidity and body mass index (BMI) of 32.0 to 32.9 in adult 08/05/2018  . Pyelonephritis 06/23/2011  . AKI (acute kidney injury) (HCC) 06/23/2011  . Diabetes mellitus 06/23/2011  . HTN (hypertension) 06/23/2011   Past Medical History:  Diagnosis Date  .  Diabetes mellitus   . High cholesterol   . Hypertension     Family History  Problem Relation Age of Onset  . Hypertension Mother   . Multiple myeloma Mother   . Hypertension Father   . Diabetes Father     Past Surgical History:  Procedure Laterality Date  . KNEE SURGERY     torn cartilage  . PATELLAR TENDON REPAIR  06/11/2011   Procedure: PATELLA TENDON REPAIR;  Surgeon: Gregory Scott Dean;  Location: [facility] ORS;  Service: Orthopedics;  Laterality: Right;   Social History   Occupational History  . Not on file  Tobacco Use  . Smoking status: Never Smoker  . Smokeless tobacco: Never Used  Substance and Sexual Activity  . Alcohol use: No  . Drug use: No  . Sexual activity: Not on file              

## 2019-07-15 ENCOUNTER — Ambulatory Visit: Payer: Medicare Other | Attending: Internal Medicine

## 2019-07-15 DIAGNOSIS — Z20822 Contact with and (suspected) exposure to covid-19: Secondary | ICD-10-CM | POA: Diagnosis not present

## 2019-07-16 LAB — NOVEL CORONAVIRUS, NAA: SARS-CoV-2, NAA: NOT DETECTED

## 2019-07-20 ENCOUNTER — Other Ambulatory Visit: Payer: Self-pay | Admitting: Internal Medicine

## 2019-07-20 ENCOUNTER — Ambulatory Visit (INDEPENDENT_AMBULATORY_CARE_PROVIDER_SITE_OTHER): Payer: Medicare Other | Admitting: Pharmacist

## 2019-07-20 DIAGNOSIS — E1122 Type 2 diabetes mellitus with diabetic chronic kidney disease: Secondary | ICD-10-CM | POA: Diagnosis not present

## 2019-07-20 DIAGNOSIS — N182 Chronic kidney disease, stage 2 (mild): Secondary | ICD-10-CM | POA: Diagnosis not present

## 2019-07-21 ENCOUNTER — Telehealth: Payer: Self-pay

## 2019-07-21 NOTE — Progress Notes (Signed)
Chronic Care Management   Visit Note  07/20/2019 Name: Amaro Outen MRN: [id number] DOB: Aug 28, 1955  Referred by: [name], MD Reason for referral : Chronic Care Management (Diabetes)   Tullio Oldman is a 64 y.o. year old male who is a primary care patient of [name], MD. The CCM team was consulted for assistance with chronic disease management and care coordination needs related to DMII  Review of patient status, including review of consultants reports, relevant laboratory and other test results, and collaboration with appropriate care team members and the patient's provider was performed as part of comprehensive patient evaluation and provision of chronic care management services.     Medications: Outpatient Encounter Medications as of 07/20/2019  Medication Sig  . amlodipine-atorvastatin (CADUET) 10-20 MG tablet TAKE 1 TABLET BY MOUTH EVERY DAY  . aspirin EC 81 MG tablet Take 81 mg by mouth daily.    . B-D ULTRAFINE III SHORT PEN 31G X 8 MM MISC USE AS DIRECTED  . cyanocobalamin (,VITAMIN B-12,) 1000 MCG/ML injection INJECT 1 ML INTRAMUSCULARLY WEEKLY FOR 3 WEEKS, THEN USE ONCE MONTHLY (Patient taking differently: 1,000 mcg every 30 (thirty) days. )  . Dulaglutide (TRULICITY) A999333 0000000 SOPN Inject 0.75 mg into the skin once a week.  . enalapril (VASOTEC) 10 MG tablet Take 1 tablet (10 mg total) by mouth daily.  . Insulin Glargine ([name] KWIKPEN [place]) Inject 22 Units into the skin at bedtime.  . metFORMIN (GLUCOPHAGE) 1000 MG tablet TAKE 1 TABLET BY MOUTH TWICE A DAY WITH A MEAL  . pioglitazone (ACTOS) 15 MG tablet Take 1 tablet (15 mg total) by mouth daily.  . sildenafil (VIAGRA) 100 MG tablet TAKE 1 TABLET BY MOUTH EVERY DAY AS NEEDED  . SYRINGE-NEEDLE, DISP, 3 ML (B-D 3CC LUER-LOK SYR 25GX5/8") 25G X 5/8" 3 ML MISC Use as directed   No facility-administered encounter medications on file as of 07/20/2019.     Objective:   Goals Addressed            This Visit's Progress     Patient Stated   . I would like to optimize my medication management of my chronic conditions (diabetes, hypertension, hyperlipidemia) (pt-stated)       Current Barriers:  . Diabetes: T2DM; most recent A1c 8.6% on 03/17/19  o Current antihyperglycemic regimen:  metformin 1g BID (LF 8/10 #90), pioglitazone 15mg  (LF 8/14 #90), [name] 20 units, Trulicity 1.5mg  weekly; Scr 1.11 o Patient approved for [employer] Cares-[name]/trulicity, medication delivered to patient's home 07/20/19  . Current meal patterns: o Breakfast: eggs, bacon, toast o Lunch: sandwiches, burgers  o Supper: protein, veggie, carbs(potatoes, fries, etc) o Snacks: encouraged low sugar snacks, veggies o Drinks: recommended patient avoid sugary drinks; make water the mainstay; tea/coffee (low sugar) . Current exercise: n/a . Current blood glucose readings: FBG<150 (goal <130) . Cardiovascular risk reduction: o Current hypertensive regimen: enalapril 5mg  QD (LF 9/24 #30), amlodipine 10mg  (in combo with atorvastatin-Brand name Caduet) o Current hyperlipidemia regimen: amlodipine/atorvas 10-20mg  (LF #90 on 9/29) LDL 106  Pharmacist Clinical Goal(s):  [name] Over the next 90 days, patient with work with PharmD and primary care provider to address needs related to optimized medication management of chronic conditions.  Interventions: Call completed to patient on 07/21/2019 . Comprehensive medication review performed, medication list updated in electronic medical record . Reviewed & discussed the following diabetes-related information with patient: o Follow ADA recommended "diabetes-friendly" diet  (reviewed healthy snack/food options) o Discussed insulin/GLP-1 injection technique o Reviewed medication purpose/side  effects-->patient denies adverse events  Patient Self Care Activities:  . Patient will check blood glucose daily , document, and provide at future appointments . Patient will focus on medication  adherence by continuing to take medications as prescribed; complete application for NovoNordisk patient assistance . Patient will take medications as prescribed . Patient will contact provider with any episodes of hypoglycemia . Patient will report any questions or concerns to provider   Please see past updates related to this goal by clicking on the "Past Updates" button in the selected goal          Plan:   The care management team will reach out to the patient again over the next 30 days.   Provider Signature Regina Eck, PharmD, BCPS Clinical Pharmacist, [facility] [place]: [phone]

## 2019-07-21 NOTE — Patient Instructions (Signed)
Visit Information  Goals Addressed            This Visit's Progress     Patient Stated   . I would like to optimize my medication management of my chronic conditions (diabetes, hypertension, hyperlipidemia) (pt-stated)       Current Barriers:  . Diabetes: T2DM; most recent A1c 8.6% on 03/17/19  o Current antihyperglycemic regimen:  metformin 1g BID (LF 8/10 #90), pioglitazone 15mg  (LF 8/14 #90), [name] 20 units, Trulicity 1.5mg  weekly; Scr 1.11 o Patient approved for [employer] Cares-[name]/trulicity, medication delivered to patient's home 07/20/19  . Current meal patterns: o Breakfast: eggs, bacon, toast o Lunch: sandwiches, burgers  o Supper: protein, veggie, carbs(potatoes, fries, etc) o Snacks: encouraged low sugar snacks, veggies o Drinks: recommended patient avoid sugary drinks; make water the mainstay; tea/coffee (low sugar) . Current exercise: n/a . Current blood glucose readings: FBG<150 (goal <130) . Cardiovascular risk reduction: o Current hypertensive regimen: enalapril 5mg  QD (LF 9/24 #30), amlodipine 10mg  (in combo with atorvastatin-Brand name Caduet) o Current hyperlipidemia regimen: amlodipine/atorvas 10-20mg  (LF #90 on 9/29) LDL 106  Pharmacist Clinical Goal(s):  [name] Over the next 90 days, patient with work with PharmD and primary care provider to address needs related to optimized medication management of chronic conditions.  Interventions: Call completed to patient on 07/21/2019 . Comprehensive medication review performed, medication list updated in electronic medical record . Reviewed & discussed the following diabetes-related information with patient: o Follow ADA recommended "diabetes-friendly" diet  (reviewed healthy snack/food options) o Discussed insulin/GLP-1 injection technique o Reviewed medication purpose/side effects-->patient denies adverse events  Patient Self Care Activities:  . Patient will check blood glucose daily , document, and provide at future  appointments . Patient will focus on medication adherence by continuing to take medications as prescribed; complete application for NovoNordisk patient assistance . Patient will take medications as prescribed . Patient will contact provider with any episodes of hypoglycemia . Patient will report any questions or concerns to provider   Please see past updates related to this goal by clicking on the "Past Updates" button in the selected goal         The patient verbalized understanding of instructions provided today and declined a print copy of patient instruction materials.   The care management team will reach out to the patient again over the next 30 days.   SIGNATURE Regina Eck, PharmD, BCPS Clinical Pharmacist, [facility] [place]: [phone]

## 2019-08-17 ENCOUNTER — Ambulatory Visit: Payer: Self-pay | Admitting: Pharmacist

## 2019-08-17 DIAGNOSIS — N182 Chronic kidney disease, stage 2 (mild): Secondary | ICD-10-CM

## 2019-08-17 DIAGNOSIS — E1122 Type 2 diabetes mellitus with diabetic chronic kidney disease: Secondary | ICD-10-CM

## 2019-08-23 ENCOUNTER — Encounter: Payer: Self-pay | Admitting: Internal Medicine

## 2019-08-26 NOTE — Progress Notes (Signed)
  Chronic Care Management   Outreach Note  08/17/2019 Name: William Mcguire MRN: [id number] DOB: 19-Sep-1955  Referred by: [name], MD Reason for referral : Chronic Care Management and Diabetes   An unsuccessful telephone outreach was attempted today. The patient was referred to the case management team for assistance with care management and care coordination.   Follow Up Plan: A HIPPA compliant phone message was left for the patient providing contact information and requesting a return call.  The care management team will reach out to the patient again over the next 10 days.   Regina Eck, PharmD, BCPS Clinical Pharmacist, [facility] [place]: [phone]

## 2019-09-01 ENCOUNTER — Telehealth: Payer: Self-pay

## 2019-09-07 ENCOUNTER — Other Ambulatory Visit: Payer: Self-pay

## 2019-09-07 ENCOUNTER — Ambulatory Visit (INDEPENDENT_AMBULATORY_CARE_PROVIDER_SITE_OTHER): Payer: Medicare Other | Admitting: Pharmacist

## 2019-09-07 ENCOUNTER — Ambulatory Visit (INDEPENDENT_AMBULATORY_CARE_PROVIDER_SITE_OTHER): Payer: Medicare Other | Admitting: Internal Medicine

## 2019-09-07 ENCOUNTER — Encounter: Payer: Self-pay | Admitting: Internal Medicine

## 2019-09-07 VITALS — BP 132/80 | HR 67 | Temp 98.2°F | Ht 72.4 in | Wt 258.2 lb

## 2019-09-07 DIAGNOSIS — E1122 Type 2 diabetes mellitus with diabetic chronic kidney disease: Secondary | ICD-10-CM

## 2019-09-07 DIAGNOSIS — Z6834 Body mass index (BMI) 34.0-34.9, adult: Secondary | ICD-10-CM

## 2019-09-07 DIAGNOSIS — N182 Chronic kidney disease, stage 2 (mild): Secondary | ICD-10-CM

## 2019-09-07 DIAGNOSIS — D519 Vitamin B12 deficiency anemia, unspecified: Secondary | ICD-10-CM

## 2019-09-07 DIAGNOSIS — E6609 Other obesity due to excess calories: Secondary | ICD-10-CM

## 2019-09-07 DIAGNOSIS — I129 Hypertensive chronic kidney disease with stage 1 through stage 4 chronic kidney disease, or unspecified chronic kidney disease: Secondary | ICD-10-CM | POA: Diagnosis not present

## 2019-09-07 LAB — POCT URINALYSIS DIPSTICK
Bilirubin, UA: NEGATIVE
Blood, UA: NEGATIVE
Glucose, UA: NEGATIVE
Ketones, UA: NEGATIVE
Leukocytes, UA: NEGATIVE
Nitrite, UA: NEGATIVE
Protein, UA: NEGATIVE
Spec Grav, UA: 1.02 (ref 1.010–1.025)
Urobilinogen, UA: 0.2 E.U./dL
pH, UA: 6 (ref 5.0–8.0)

## 2019-09-07 NOTE — Patient Instructions (Signed)

## 2019-09-07 NOTE — Progress Notes (Signed)
This visit occurred during the SARS-CoV-2 public health emergency.  Safety protocols were in place, including screening questions prior to the visit, additional usage of staff PPE, and extensive cleaning of exam room while observing appropriate contact time as indicated for disinfecting solutions.  Subjective:     Patient ID: William Mcguire , male    DOB: 03-31-1956 , 64 y.o.   MRN: [id number]   Chief Complaint  Patient presents with  . Diabetes  . Hypertension    HPI  He is here today for diabetes check. He has been using 0..67PC Trulicity without any issues. He is still taking Actos as well.   Diabetes He presents for his follow-up diabetic visit. He has type 2 diabetes mellitus. His disease course has been stable. There are no hypoglycemic associated symptoms. Pertinent negatives for diabetes include no blurred vision and no chest pain. There are no hypoglycemic complications. Diabetic complications include nephropathy. Risk factors for coronary artery disease include diabetes mellitus, dyslipidemia, hypertension, male sex and sedentary lifestyle. His breakfast blood glucose is taken between 8-9 am. His breakfast blood glucose range is generally 90-110 mg/dl.  Hypertension This is a chronic problem. The current episode started more than 1 year ago. The problem has been gradually improving since onset. The problem is controlled. Pertinent negatives include no blurred vision, chest pain, palpitations or shortness of breath.     Past Medical History:  Diagnosis Date  . Diabetes mellitus   . High cholesterol   . Hypertension      Family History  Problem Relation Age of Onset  . Hypertension Mother   . Multiple myeloma Mother   . Hypertension Father   . Diabetes Father      Current Outpatient Medications:  .  amlodipine-atorvastatin (CADUET) 10-20 MG tablet, TAKE 1 TABLET BY MOUTH EVERY DAY, Disp: 90 tablet, Rfl: 1 .  aspirin EC 81 MG tablet, Take 81 mg by mouth daily.  ,  Disp: , Rfl:  .  B-D ULTRAFINE III SHORT PEN 31G X 8 MM MISC, USE AS DIRECTED, Disp: 100 each, Rfl: PRN .  cyanocobalamin (,VITAMIN B-12,) 1000 MCG/ML injection, INJECT 1 ML INTRAMUSCULARLY WEEKLY FOR 3 WEEKS, THEN USE ONCE MONTHLY (Patient taking differently: 1,000 mcg every 30 (thirty) days. ), Disp: 9 mL, Rfl: 3 .  Dulaglutide (TRULICITY) 3.40 BT/2.4EL SOPN, Inject 0.75 mg into the skin once a week., Disp: , Rfl:  .  enalapril (VASOTEC) 10 MG tablet, Take 1 tablet (10 mg total) by mouth daily., Disp: 90 tablet, Rfl: 2 .  Insulin Glargine ([name] KWIKPEN [place]), Inject 22 Units into the skin at bedtime., Disp: , Rfl:  .  metFORMIN (GLUCOPHAGE) 1000 MG tablet, TAKE 1 TABLET BY MOUTH TWICE A DAY WITH A MEAL, Disp: 180 tablet, Rfl: 0 .  sildenafil (VIAGRA) 100 MG tablet, TAKE 1 TABLET BY MOUTH EVERY DAY AS NEEDED, Disp: 10 tablet, Rfl: 5 .  SYRINGE-NEEDLE, DISP, 3 ML (B-D 3CC LUER-LOK SYR 25GX5/8") 25G X 5/8" 3 ML MISC, Use as directed, Disp: 100 each, Rfl: 2   No Known Allergies   Review of Systems  Constitutional: Negative.   Eyes: Negative for blurred vision.  Respiratory: Negative.  Negative for shortness of breath.   Cardiovascular: Negative.  Negative for chest pain and palpitations.  Gastrointestinal: Negative.   Neurological: Negative.   Psychiatric/Behavioral: Negative.      Today's Vitals   09/07/19 1009  BP: 132/80  Pulse: 67  Temp: 98.2 F (36.8 C)  TempSrc: Oral  Weight:  258 lb 3.2 oz (117.1 kg)  Height: 6' 0.4" (1.839 m)  PainSc: 0-No pain   Body mass index is 34.63 kg/m.   Objective:  Physical Exam Vitals and nursing note reviewed.  Constitutional:      Appearance: Normal appearance. He is obese.  Cardiovascular:     Rate and Rhythm: Normal rate and regular rhythm.     Heart sounds: Normal heart sounds.  Pulmonary:     Effort: Pulmonary effort is normal.     Breath sounds: Normal breath sounds.  Skin:    General: Skin is warm.  Neurological:     General:  No focal deficit present.     Mental Status: He is alert.  Psychiatric:        Mood and Affect: Mood normal.         Assessment And Plan:     1. Type 2 diabetes mellitus with stage 2 chronic kidney disease, without long-term current use of insulin (HCC)  Chronic. I will check labs as listed below. Importance of regular exercise was discussed with the patient. He was advised to STOP Actos when he runs out of current precription.   At that time, I plan to increase Trulicity to 2.5GC once weekly. We will not waste his current supply of meds, he will inject 0.39m x 2 once weekly until he runs out. All questions were answered to his satisfaction. He was encouraged to call office if he has any questions regarding medication changes.   - BMP8+EGFR - Hemoglobin A1c - POCT Urinalysis Dipstick (81002)  2. Hypertensive nephropathy  Chronic, controlled. He will continue with current meds. He is encouraged to avoid adding salt to his foods.   3. Anemia due to vitamin B12 deficiency, unspecified B12 deficiency type  I will check vitamin B12 level at next visit. He self injects once monthly, last injection on 3/1.   4. Class 1 obesity due to excess calories with serious comorbidity and body mass index (BMI) of 34.0 to 34.9 in adult  He is encouraged to strive for BMI less than 30 to decrease cardiac risk. AGAIN, importance of regular exercise was discussed with the patient. He is advised to aim for at least 150 minutes per week.   RMaximino Greenland MD    THE PATIENT IS ENCOURAGED TO PRACTICE SOCIAL DISTANCING DUE TO THE COVID-19 PANDEMIC.

## 2019-09-08 LAB — BMP8+EGFR
BUN/Creatinine Ratio: 12 (ref 10–24)
BUN: 13 mg/dL (ref 8–27)
CO2: 23 mmol/L (ref 20–29)
Calcium: 10.2 mg/dL (ref 8.6–10.2)
Chloride: 103 mmol/L (ref 96–106)
Creatinine, Ser: 1.05 mg/dL (ref 0.76–1.27)
GFR calc Af Amer: 86 mL/min/{1.73_m2} (ref >59)
GFR calc non Af Amer: 74 mL/min/{1.73_m2} (ref >59)
Glucose: 110 mg/dL — ABNORMAL HIGH (ref 65–99)
Potassium: 4.5 mmol/L (ref 3.5–5.2)
Sodium: 140 mmol/L (ref 134–144)

## 2019-09-08 LAB — HEMOGLOBIN A1C
Est. average glucose Bld gHb Est-mCnc: 151 mg/dL
Hgb A1c MFr Bld: 6.9 % — ABNORMAL HIGH (ref 4.8–5.6)

## 2019-09-08 NOTE — Progress Notes (Signed)
Chronic Care Management    Visit Note  09/07/2019 Name: William Mcguire MRN: [id number] DOB: [birth year]  Referred by: [name], MD Reason for referral : Chronic Care Management and Diabetes   William Mcguire is a 64 y.o. year old male who is a primary care patient of [name], MD. The CCM team was consulted for assistance with chronic disease management and care coordination needs related to DMII  Review of patient status, including review of consultants reports, relevant laboratory and other test results, and collaboration with appropriate care team members and the patient's provider was performed as part of comprehensive patient evaluation and provision of chronic care management services.    SDOH (Social Determinants of Health) assessments performed: No See Care Plan activities for detailed interventions related to SDOH     Medications: Outpatient Encounter Medications as of 09/07/2019  Medication Sig  . amlodipine-atorvastatin (CADUET) 10-20 MG tablet TAKE 1 TABLET BY MOUTH EVERY DAY  . aspirin EC 81 MG tablet Take 81 mg by mouth daily.    . B-D ULTRAFINE III SHORT PEN 31G X 8 MM MISC USE AS DIRECTED  . cyanocobalamin (,VITAMIN B-12,) 1000 MCG/ML injection INJECT 1 ML INTRAMUSCULARLY WEEKLY FOR 3 WEEKS, THEN USE ONCE MONTHLY (Patient taking differently: 1,000 mcg every 30 (thirty) days. )  . Dulaglutide (TRULICITY) A999333 0000000 SOPN Inject 0.75 mg into the skin once a week.  . enalapril (VASOTEC) 10 MG tablet Take 1 tablet (10 mg total) by mouth daily.  . Insulin Glargine ([name] KWIKPEN [place]) Inject 22 Units into the skin at bedtime.  . metFORMIN (GLUCOPHAGE) 1000 MG tablet TAKE 1 TABLET BY MOUTH TWICE A DAY WITH A MEAL  . sildenafil (VIAGRA) 100 MG tablet TAKE 1 TABLET BY MOUTH EVERY DAY AS NEEDED  . SYRINGE-NEEDLE, DISP, 3 ML (B-D 3CC LUER-LOK SYR 25GX5/8") 25G X 5/8" 3 ML MISC Use as directed   No facility-administered encounter medications on file as of 09/07/2019.      Objective:   Goals Addressed            This Visit's Progress     Patient Stated   . I would like to optimize my medication management of my chronic conditions (diabetes, hypertension, hyperlipidemia) (pt-stated)       Current Barriers:  . Diabetes: T2DM; most recent A1c 6.8% on 3.17.21 o Current antihyperglycemic regimen:  metformin 1g BID (LF 8/10 #90), pioglitazone 15mg  (LF 8/14 #90), [name] 20 units, Trulicity 1.5mg  weekly; Scr 1.11 o Patient approved for [employer] Cares-[name]/trulicity, medication delivered to patient's home 07/20/19 - Dose change form sent to [employer] for Trulicity 1.5mg  weekly - Discontinue pioglitazone once refill complete--d/c'd from med list . Current meal patterns: o Breakfast: eggs, bacon, toast o Lunch: sandwiches, burgers  o Supper: protein, veggie, carbs(potatoes, fries, etc) o Snacks: encouraged low sugar snacks, veggies o Drinks: recommended patient avoid sugary drinks; make water the mainstay; tea/coffee (low sugar) . Current exercise: n/a . Current blood glucose readings: FBG<150 (goal <130) . Cardiovascular risk reduction: o Current hypertensive regimen: enalapril 5mg  QD (LF 9/24 #30), amlodipine 10mg  (in combo with atorvastatin-Brand name Caduet) o Current hyperlipidemia regimen: amlodipine/atorvas 10-20mg  LDL 106  Pharmacist Clinical Goal(s):  [name] Over the next 90 days, patient with work with PharmD and primary care provider to address needs related to optimized medication management of chronic conditions.  Interventions: . Comprehensive medication review performed, medication list updated in electronic medical record . Reviewed & discussed the following diabetes-related information with patient: o Follow ADA recommended "  diabetes-friendly" diet  (reviewed healthy snack/food options) o Discussed insulin/GLP-1 injection technique o Reviewed medication purpose/side effects-->patient denies adverse events  Patient Self Care Activities:   . Patient will check blood glucose daily , document, and provide at future appointments . Patient will focus on medication adherence by continuing to take medications as prescribed; complete application for NovoNordisk patient assistance . Patient will take medications as prescribed . Patient will contact provider with any episodes of hypoglycemia . Patient will report any questions or concerns to provider   Please see past updates related to this goal by clicking on the "Past Updates" button in the selected goal         Provider Signature   Regina Eck, PharmD, [place] Pharmacist, [facility] [place]: [phone]

## 2019-09-08 NOTE — Patient Instructions (Signed)
Visit Information  Goals Addressed            This Visit's Progress     Patient Stated   . I would like to optimize my medication management of my chronic conditions (diabetes, hypertension, hyperlipidemia) (pt-stated)       Current Barriers:  . Diabetes: T2DM; most recent A1c 6.8% on 3.17.21 o Current antihyperglycemic regimen:  metformin 1g BID (LF 8/10 #90), pioglitazone 15mg  (LF 8/14 #90), [name] 20 units, Trulicity 1.5mg  weekly; Scr 1.11 o Patient approved for [employer] Cares-[name]/trulicity, medication delivered to patient's home 07/20/19 - Dose change form sent to [employer] for Trulicity 1.5mg  weekly - Discontinue pioglitazone once refill complete--d/c'd from med list . Current meal patterns: o Breakfast: eggs, bacon, toast o Lunch: sandwiches, burgers  o Supper: protein, veggie, carbs(potatoes, fries, etc) o Snacks: encouraged low sugar snacks, veggies o Drinks: recommended patient avoid sugary drinks; make water the mainstay; tea/coffee (low sugar) . Current exercise: n/a . Current blood glucose readings: FBG<150 (goal <130) . Cardiovascular risk reduction: o Current hypertensive regimen: enalapril 5mg  QD (LF 9/24 #30), amlodipine 10mg  (in combo with atorvastatin-Brand name Caduet) o Current hyperlipidemia regimen: amlodipine/atorvas 10-20mg  LDL 106  Pharmacist Clinical Goal(s):  [name] Over the next 90 days, patient with work with PharmD and primary care provider to address needs related to optimized medication management of chronic conditions.  Interventions: . Comprehensive medication review performed, medication list updated in electronic medical record . Reviewed & discussed the following diabetes-related information with patient: o Follow ADA recommended "diabetes-friendly" diet  (reviewed healthy snack/food options) o Discussed insulin/GLP-1 injection technique o Reviewed medication purpose/side effects-->patient denies adverse events  Patient Self Care Activities:   . Patient will check blood glucose daily , document, and provide at future appointments . Patient will focus on medication adherence by continuing to take medications as prescribed; complete application for NovoNordisk patient assistance . Patient will take medications as prescribed . Patient will contact provider with any episodes of hypoglycemia . Patient will report any questions or concerns to provider   Please see past updates related to this goal by clicking on the "Past Updates" button in the selected goal         The patient verbalized understanding of instructions provided today and declined a print copy of patient instruction materials.    SIGNATURE Regina Eck, PharmD, BCPS Clinical Pharmacist, [facility] [place]: [phone]

## 2019-09-26 ENCOUNTER — Other Ambulatory Visit: Payer: Self-pay

## 2019-09-26 MED ORDER — METFORMIN HCL 1000 MG PO TABS: ORAL_TABLET | ORAL | 0 refills | Status: DC

## 2019-10-10 DIAGNOSIS — H52223 Regular astigmatism, bilateral: Secondary | ICD-10-CM | POA: Diagnosis not present

## 2019-10-10 LAB — HM DIABETES EYE EXAM

## 2019-10-18 ENCOUNTER — Other Ambulatory Visit: Payer: Self-pay

## 2019-10-18 MED ORDER — BD PEN NEEDLE SHORT U/F 31G X 8 MM MISC: 99 refills | Status: DC

## 2019-10-19 DIAGNOSIS — Z0120 Encounter for dental examination and cleaning without abnormal findings: Secondary | ICD-10-CM | POA: Diagnosis not present

## 2019-11-18 ENCOUNTER — Telehealth: Payer: Self-pay

## 2019-11-25 ENCOUNTER — Ambulatory Visit: Payer: Self-pay

## 2019-11-25 DIAGNOSIS — N182 Chronic kidney disease, stage 2 (mild): Secondary | ICD-10-CM

## 2019-11-25 NOTE — Patient Instructions (Signed)
Social Worker Visit Information  Goals we discussed today:  Goals Addressed            This Visit's Progress     Patient Stated   . "I would like to continue to work on lowering my A1C" (pt-stated)   On track    Current Barriers:  [name] Knowledge Deficits related to Diabetes disease process and Self Health managment . Chronic Disease Management support and education needs related to Diabetes Mellitus  Nurse Case Manager Clinical Goal(s):  [name] Over the next 90 days, patient will work with the CCM team to address needs related to Diabetes disease management , support and education to help improve his daily glycemic control and lower his A1C . Over the next 90 days, patient will lower his A1C <7.7 Goal Met 3/17 . New 11/25/19- Over the next 120 days the patient will continue to optimize management of DM II as evidenced by continuing to follow DM diet with A1C results remaining below 7.0  CCM SW Interventions: Completed 11/25/19 . Performed chart review to note continued decline in A1C. Most recent result from 3/17 indicated A1C of 6.9 . Successful outbound call placed to the patient to review care coordination needs . Confirmed patient is continuing to follow DM regimen as outlined below by [facility] . Congratulated patient for continued improvement in A1C and for meeting goal . Encouraged the patient to contact care management team as needed . Scheduled follow up call with RN Care Manager over the next 60 days  CCM RN CM Interventions:  06/06/19 call completed with patient   . Evaluation of current treatment plan related to Diabetes and patient's adherence to plan as established by provider. . Provided education to patient re: most recent A1C of 7.7 obtained on 05/30/19; educated on target A1C of <7.0; education on importance or adhering to following a Diabetic friendly diet and implementing exercise into his daily routine; educated patient on target daily glycemic control 80-130   . Reviewed medications with patient and discussed patient is adhering to his prescribed treatment plan; Metformin, Xultophy . Discussed plans with patient for ongoing care management follow up and provided patient with direct contact information for care management team . Provided patient with printed educational materials related to Diabetes Management Safety Zone Tool, Managing Your Diabetes using the Plate Method; Carb Counting, Carb Choices; Know Your A1C; signs/symptoms of Hypo/Hyperglycemia . Advised patient, providing education and rationale, to check cbg daily before meals and record, calling the CCM team and or PCP for findings outside established parameters.    Patient Self Care Activities:  . Patient verbalizes understanding of plan to reviewed printed Diabetes education materials sent via mail and review with CCM RN CM at next scheduled follow up call  . Self administers medications as prescribed . Attends all scheduled provider appointments . Calls pharmacy for medication refills . Performs ADL's independently . Performs IADL's independently . Calls provider office for new concerns or questions   Please see past updates related to this goal by clicking on the "Past Updates" button in the selected goal        Other   . COMPLETED: Assist with Chronic Care Management and Care Coordination needs       Current Barriers:  [name] Knowledge Barriers related to resources and support available to address needs related to Chronic disease management and Care Coordination   Case Manager Clinical Goal(s):  [name] Over the next 30 days, patient will work with the CCM team  to address needs related to chronic disease management and care coordination needs, including financial assistance with cost of Xultrophy Chart review performed; goal met on time. See previous PharmD notes.  Interventions:  . Collaborated with BSW and initiated plan of care to address needs related to chronic disease management  for DMII and assistance with financial resources needed to help reduce the cost of Xultrophy  Patient Self Care Activities:  . Attends all scheduled provider appointments . Calls provider office for new concerns or questions  Initial goal documentation         Follow Up Plan: No SW follow up planned at this time. Please contact us with future needs.  Daneen Schick, BSW, CDP Social Worker, Certified Dementia Practitioner [place] [facility] [place] [facility] [phone]

## 2019-11-25 NOTE — Chronic Care Management (AMB) (Signed)
Chronic Care Management    Social Work Follow Up Note  11/25/2019 Name: Grayland Daisey MRN: [id number] DOB: Nov 06, 1955  Ezariah Nace is a 64 y.o. year old male who is a primary care patient of [name], MD. The CCM team was consulted for assistance with care coordination.   Review of patient status, including review of consultants reports, other relevant assessments, and collaboration with appropriate care team members and the patient's provider was performed as part of comprehensive patient evaluation and provision of chronic care management services.    SDOH (Social Determinants of Health) assessments performed: No    Outpatient Encounter Medications as of 11/25/2019  Medication Sig  . amlodipine-atorvastatin (CADUET) 10-20 MG tablet TAKE 1 TABLET BY MOUTH EVERY DAY  . aspirin EC 81 MG tablet Take 81 mg by mouth daily.    . cyanocobalamin (,VITAMIN B-12,) 1000 MCG/ML injection INJECT 1 ML INTRAMUSCULARLY WEEKLY FOR 3 WEEKS, THEN USE ONCE MONTHLY (Patient taking differently: 1,000 mcg every 30 (thirty) days. )  . Dulaglutide (TRULICITY) 2.11 HE/1.7EY SOPN Inject 0.75 mg into the skin once a week.  . enalapril (VASOTEC) 10 MG tablet Take 1 tablet (10 mg total) by mouth daily.  . Insulin Glargine ([name] KWIKPEN [place]) Inject 22 Units into the skin at bedtime.  . Insulin Pen Needle (B-D ULTRAFINE III SHORT PEN) 31G X 8 MM MISC USE AS DIRECTED  . metFORMIN (GLUCOPHAGE) 1000 MG tablet TAKE 1 TABLET BY MOUTH TWICE A DAY WITH A MEAL  . sildenafil (VIAGRA) 100 MG tablet TAKE 1 TABLET BY MOUTH EVERY DAY AS NEEDED  . SYRINGE-NEEDLE, DISP, 3 ML (B-D 3CC LUER-LOK SYR 25GX5/8") 25G X 5/8" 3 ML MISC Use as directed   No facility-administered encounter medications on file as of 11/25/2019.     Goals Addressed            This Visit's Progress     Patient Stated   . "I would like to continue to work on lowering my A1C" (pt-stated)   On track    Current Barriers:  [name] Knowledge Deficits  related to Diabetes disease process and Self Health managment . Chronic Disease Management support and education needs related to Diabetes Mellitus  Nurse Case Manager Clinical Goal(s):  [name] Over the next 90 days, patient will work with the CCM team to address needs related to Diabetes disease management , support and education to help improve his daily glycemic control and lower his A1C . Over the next 90 days, patient will lower his A1C <7.7 Goal Met 3/17 . New 11/25/19- Over the next 120 days the patient will continue to optimize management of DM II as evidenced by continuing to follow DM diet with A1C results remaining below 7.0  CCM SW Interventions: Completed 11/25/19 . Performed chart review to note continued decline in A1C. Most recent result from 3/17 indicated A1C of 6.9 . Successful outbound call placed to the patient to review care coordination needs . Confirmed patient is continuing to follow DM regimen as outlined below by [facility] . Congratulated patient for continued improvement in A1C and for meeting goal . Encouraged the patient to contact care management team as needed . Scheduled follow up call with RN Care Manager over the next 60 days  CCM RN CM Interventions:  06/06/19 call completed with patient   . Evaluation of current treatment plan related to Diabetes and patient's adherence to plan as established by provider. . Provided education to patient re: most recent A1C of  7.7 obtained on 05/30/19; educated on target A1C of <7.0; education on importance or adhering to following a Diabetic friendly diet and implementing exercise into his daily routine; educated patient on target daily glycemic control 80-130  . Reviewed medications with patient and discussed patient is adhering to his prescribed treatment plan; Metformin, Xultophy . Discussed plans with patient for ongoing care management follow up and provided patient with direct contact information for care management  team . Provided patient with printed educational materials related to Diabetes Management Safety Zone Tool, Managing Your Diabetes using the Plate Method; Carb Counting, Carb Choices; Know Your A1C; signs/symptoms of Hypo/Hyperglycemia . Advised patient, providing education and rationale, to check cbg daily before meals and record, calling the CCM team and or PCP for findings outside established parameters.    Patient Self Care Activities:  . Patient verbalizes understanding of plan to reviewed printed Diabetes education materials sent via mail and review with CCM RN CM at next scheduled follow up call  . Self administers medications as prescribed . Attends all scheduled provider appointments . Calls pharmacy for medication refills . Performs ADL's independently . Performs IADL's independently . Calls provider office for new concerns or questions   Please see past updates related to this goal by clicking on the "Past Updates" button in the selected goal        Other   . COMPLETED: Assist with Chronic Care Management and Care Coordination needs       Current Barriers:  [name] Knowledge Barriers related to resources and support available to address needs related to Chronic disease management and Care Coordination   Case Manager Clinical Goal(s):  [name] Over the next 30 days, patient will work with the CCM team to address needs related to chronic disease management and care coordination needs, including financial assistance with cost of Xultrophy Chart review performed; goal met on time. See previous PharmD notes.  Interventions:  . Collaborated with BSW and initiated plan of care to address needs related to chronic disease management for DMII and assistance with financial resources needed to help reduce the cost of Xultrophy  Patient Self Care Activities:  . Attends all scheduled provider appointments . Calls provider office for new concerns or questions  Initial goal documentation          Follow Up Plan: No SW follow up planned at this time. RN CM will follow up with the paitent over the next 60 days.   Daneen Schick, BSW, CDP Social Worker, Certified Dementia Practitioner [place] [facility] [place] [facility] [phone]  Total time spent performing care coordination and/or care management activities with the patient by phone or face to face = 13 minutes.

## 2019-11-29 ENCOUNTER — Other Ambulatory Visit: Payer: Self-pay | Admitting: Internal Medicine

## 2019-11-30 ENCOUNTER — Other Ambulatory Visit: Payer: Self-pay

## 2019-11-30 ENCOUNTER — Ambulatory Visit (INDEPENDENT_AMBULATORY_CARE_PROVIDER_SITE_OTHER): Payer: Medicare Other | Admitting: Internal Medicine

## 2019-11-30 ENCOUNTER — Encounter: Payer: Self-pay | Admitting: Internal Medicine

## 2019-11-30 ENCOUNTER — Encounter: Payer: BC Managed Care – PPO | Admitting: Internal Medicine

## 2019-11-30 VITALS — BP 146/88 | HR 68 | Temp 98.6°F | Ht 72.2 in | Wt 248.2 lb

## 2019-11-30 DIAGNOSIS — N182 Chronic kidney disease, stage 2 (mild): Secondary | ICD-10-CM | POA: Diagnosis not present

## 2019-11-30 DIAGNOSIS — R351 Nocturia: Secondary | ICD-10-CM

## 2019-11-30 DIAGNOSIS — Z0000 Encounter for general adult medical examination without abnormal findings: Secondary | ICD-10-CM | POA: Diagnosis not present

## 2019-11-30 DIAGNOSIS — I129 Hypertensive chronic kidney disease with stage 1 through stage 4 chronic kidney disease, or unspecified chronic kidney disease: Secondary | ICD-10-CM

## 2019-11-30 DIAGNOSIS — E1122 Type 2 diabetes mellitus with diabetic chronic kidney disease: Secondary | ICD-10-CM | POA: Diagnosis not present

## 2019-11-30 LAB — POC HEMOCCULT BLD/STL (OFFICE/1-CARD/DIAGNOSTIC): Fecal Occult Blood, POC: NEGATIVE

## 2019-11-30 LAB — POCT UA - MICROALBUMIN
Albumin/Creatinine Ratio, Urine, POC: 30
Creatinine, POC: 300 mg/dL
Microalbumin Ur, POC: 30 mg/L

## 2019-11-30 LAB — POCT URINALYSIS DIPSTICK
Bilirubin, UA: NEGATIVE
Blood, UA: NEGATIVE
Glucose, UA: NEGATIVE
Ketones, UA: NEGATIVE
Leukocytes, UA: NEGATIVE
Nitrite, UA: NEGATIVE
Protein, UA: NEGATIVE
Spec Grav, UA: 1.015 (ref 1.010–1.025)
Urobilinogen, UA: 0.2 E.U./dL
pH, UA: 5.5 (ref 5.0–8.0)

## 2019-11-30 NOTE — Progress Notes (Signed)
Subjective:   Decklyn Hyder is a 64 y.o. male who presents for a Welcome to Medicare exam.   He presents today for his first AWV, or Welcome To Medicare exam.  He has no particular concerns or complaints at this time. He has received COVID vaccine, does not know the dates.   Diabetes He presents for his follow-up diabetic visit. He has type 2 diabetes mellitus. His disease course has been stable. There are no hypoglycemic associated symptoms. Pertinent negatives for diabetes include no blurred vision and no chest pain. There are no hypoglycemic complications. Diabetic complications include nephropathy. Risk factors for coronary artery disease include diabetes mellitus, dyslipidemia, hypertension, male sex and sedentary lifestyle. He participates in exercise intermittently. His breakfast blood glucose is taken between 8-9 am. His breakfast blood glucose range is generally 90-110 mg/dl. An ACE inhibitor/angiotensin II receptor blocker is being taken.  Hypertension This is a chronic problem. The current episode started more than 1 year ago. The problem has been gradually improving since onset. The problem is controlled. Pertinent negatives include no blurred vision, chest pain, palpitations or shortness of breath. Risk factors for coronary artery disease include diabetes mellitus, dyslipidemia and male gender. The current treatment provides moderate improvement. Compliance problems include exercise.     Review of Systems:  Review of Systems  Constitutional: Negative.   Eyes: Negative.  Negative for blurred vision.  Respiratory: Negative.  Negative for shortness of breath.   Cardiovascular: Negative.  Negative for chest pain and palpitations.  Gastrointestinal: Negative.   Genitourinary: Negative.   Musculoskeletal: Negative.   Skin: Negative.   Neurological: Negative.   Endo/Heme/Allergies: Negative.   Psychiatric/Behavioral: Negative.     Cardiac Risk Factors include: diabetes  mellitus;hypertension , male sex, high cholesterol.     Objective:    Today's Vitals   11/30/19 0946 11/30/19 1016  BP: (!) 146/88   Pulse: 68   Temp: 98.6 F (37 C)   TempSrc: Oral   Weight: 248 lb 3.2 oz (112.6 kg)   Height: 6' 0.2" (1.834 m)   PainSc: 0-No pain 0-No pain   Body mass index is 33.48 kg/m.  Medications Outpatient Encounter Medications as of 11/30/2019  Medication Sig  . amlodipine-atorvastatin (CADUET) 10-20 MG tablet TAKE 1 TABLET BY MOUTH EVERY DAY  . aspirin EC 81 MG tablet Take 81 mg by mouth daily.    . cyanocobalamin (,VITAMIN B-12,) 1000 MCG/ML injection INJECT 1 ML INTRAMUSCULARLY WEEKLY FOR 3 WEEKS, THEN USE ONCE MONTHLY (Patient taking differently: 1,000 mcg every 30 (thirty) days. )  . Dulaglutide (TRULICITY) 7.00 FV/4.9SW SOPN Inject 0.75 mg into the skin once a week.  . enalapril (VASOTEC) 10 MG tablet Take 1 tablet (10 mg total) by mouth daily.  . Insulin Pen Needle (B-D ULTRAFINE III SHORT PEN) 31G X 8 MM MISC USE AS DIRECTED  . metFORMIN (GLUCOPHAGE) 1000 MG tablet TAKE 1 TABLET BY MOUTH TWICE A DAY WITH A MEAL  . sildenafil (VIAGRA) 100 MG tablet TAKE 1 TABLET BY MOUTH EVERY DAY AS NEEDED  . SYRINGE-NEEDLE, DISP, 3 ML (B-D 3CC LUER-LOK SYR 25GX5/8") 25G X 5/8" 3 ML MISC USE AS DIRECTED  . [DISCONTINUED] Insulin Glargine ([name] KWIKPEN ) Inject 22 Units into the skin at bedtime.  . [DISCONTINUED] B-D ULTRAFINE III SHORT PEN 31G X 8 MM MISC USE AS DIRECTED  . [DISCONTINUED] metFORMIN (GLUCOPHAGE) 1000 MG tablet TAKE 1 TABLET BY MOUTH TWICE A DAY WITH A MEAL  . [DISCONTINUED] SYRINGE-NEEDLE, DISP, 3 ML (B-D  3CC LUER-LOK SYR 25GX5/8") 25G X 5/8" 3 ML MISC Use as directed   No facility-administered encounter medications on file as of 11/30/2019.     History: Past Medical History:  Diagnosis Date  . Diabetes mellitus   . High cholesterol   . Hypertension    Past Surgical History:  Procedure Laterality Date  . KNEE SURGERY     torn cartilage   . PATELLAR TENDON REPAIR  06/11/2011   Procedure: PATELLA TENDON REPAIR;  Surgeon: Meredith Pel;  Location: [facility];  Service: Orthopedics;  Laterality: Right;    Family History  Problem Relation Age of Onset  . Hypertension Mother   . Multiple myeloma Mother   . Hypertension Father   . Diabetes Father    Social History   Occupational History  . Not on file  Tobacco Use  . Smoking status: Never Smoker  . Smokeless tobacco: Never Used  . Tobacco comment: n/a  Vaping Use  . Vaping Use: Never used  Substance and Sexual Activity  . Alcohol use: No  . Drug use: No  . Sexual activity: Not on file   Tobacco Counseling Counseling given: No Comment: n/a   Immunizations and Health Maintenance Immunization History  Administered Date(s) Administered  . Influenza Split 06/12/2011  . Influenza, High Dose Seasonal PF 03/22/2019  . Influenza,inj,Quad PF,6+ Mos 05/05/2018  . Influenza-Unspecified 03/22/2019  . Pneumococcal Polysaccharide-23 04/25/2019   Health Maintenance Due  Topic Date Due  . COVID-19 Vaccine (1) Never done  . OPHTHALMOLOGY EXAM  08/10/2019    Activities of Daily Living In your present state of health, do you have any difficulty performing the following activities: 11/30/2019 05/30/2019  Hearing? N N  Vision? N N  Difficulty concentrating or making decisions? N N  Walking or climbing stairs? N N  Dressing or bathing? N N  Doing errands, shopping? N N  Preparing Food and eating ? N -  Using the Toilet? N -  In the past six months, have you accidently leaked urine? N -  Do you have problems with loss of bowel control? N -  Managing your Medications? N -  Managing your Finances? N -  Housekeeping or managing your Housekeeping? N -  Some recent data might be hidden    Physical Exam   Physical Exam Vitals and nursing note reviewed.  HENT:     Head: Normocephalic and atraumatic.     Right Ear: Tympanic membrane, ear canal and external ear normal.      Left Ear: Tympanic membrane, ear canal and external ear normal.     Nose:     Comments: Deferred, masked    Mouth/Throat:     Comments: Deferred, masked Eyes:     Extraocular Movements: Extraocular movements intact.     Conjunctiva/sclera: Conjunctivae normal.     Pupils: Pupils are equal, round, and reactive to light.  Cardiovascular:     Rate and Rhythm: Normal rate and regular rhythm.     Pulses: Normal pulses.          Dorsalis pedis pulses are 2+ on the right side and 2+ on the left side.     Heart sounds: Normal heart sounds.  Pulmonary:     Effort: Pulmonary effort is normal.  Abdominal:     General: Bowel sounds are normal.     Palpations: Abdomen is soft.  Genitourinary:    Prostate: Normal.     Rectum: Normal. Guaiac result negative.  Musculoskeletal:  General: Normal range of motion.     Cervical back: Normal range of motion and neck supple.  Feet:     Right foot:     Protective Sensation: 5 sites tested. 5 sites sensed.     Skin integrity: Callus and dry skin present.     Toenail Condition: Right toenails are abnormally thick.     Left foot:     Protective Sensation: 5 sites tested. 5 sites sensed.     Skin integrity: Callus and dry skin present.     Toenail Condition: Left toenails are abnormally thick.  Skin:    General: Skin is warm and dry.  Neurological:     Mental Status: He is alert and oriented to person, place, and time.  Psychiatric:        Mood and Affect: Affect normal.     (optional), or other factors deemed appropriate based on the beneficiary's medical and social history and current clinical standards.  Advanced Directives: Does Patient Have a Medical Advance Directive?: No Type of Advance Directive: J[place] Does patient want to make changes to medical advance directive?: Yes (MAU/Ambulatory/Procedural Areas - Information given) Copy of [place] in Chart?: No - copy requested Would patient  like information on creating a medical advance directive?: No - Patient declined    Assessment:    This is a routine wellness  examination for this patient .  Vision/Hearing screen  Hearing Screening   125Hz 250Hz 500Hz 1000Hz 2000Hz 3000Hz 4000Hz 6000Hz 8000Hz  Right ear:  _0 Left ear:  _1 Visual Acuity Screening   Right eye Left eye Both eyes  Without correction: 20/30 20/50 20/30  With correction:       Dietary issues and exercise activities discussed:  Current Exercise Habits: Home exercise routine, Type of exercise: walking;treadmill;strength training/weights, Time (Minutes): 60, Frequency (Times/Week): 2, Weekly Exercise (Minutes/Week): 120, Intensity: Moderate  Goals    .  "I would like to continue to work on lowering my A1C" (pt-stated)      Current Barriers:  [name] Knowledge Deficits related to Diabetes disease process and Self Health managment . Chronic Disease Management support and education needs related to Diabetes Mellitus  Nurse Case Manager Clinical Goal(s):  [name] Over the next 90 days, patient will work with the CCM team to address needs related to Diabetes disease management , support and education to help improve his daily glycemic control and lower his A1C . Over the next 90 days, patient will lower his A1C <7.7 Goal Met 3/17 . New 11/25/19- Over the next 120 days the patient will continue to optimize management of DM II as evidenced by continuing to follow DM diet with A1C results remaining below 7.0  CCM SW Interventions: Completed 11/25/19 . Performed chart review to note continued decline in A1C. Most recent result from 3/17 indicated A1C of 6.9 . Successful outbound call placed to the patient to review care coordination needs . Confirmed patient is continuing to follow DM regimen as outlined below by [facility] . Congratulated patient for continued improvement in A1C and for meeting goal . Encouraged the patient to contact  care management team as needed . Scheduled follow up call with RN Care Manager over the next 60 days  CCM RN CM Interventions:  06/06/19 call completed with patient   . Evaluation of current treatment plan related to Diabetes  and patient's adherence to plan as established by provider. . Provided education to patient re: most recent A1C of 7.7 obtained on 05/30/19; educated on target A1C of <7.0; education on importance or adhering to following a Diabetic friendly diet and implementing exercise into his daily routine; educated patient on target daily glycemic control 80-130  . Reviewed medications with patient and discussed patient is adhering to his prescribed treatment plan; Metformin, Xultophy . Discussed plans with patient for ongoing care management follow up and provided patient with direct contact information for care management team . Provided patient with printed educational materials related to Diabetes Management Safety Zone Tool, Managing Your Diabetes using the Plate Method; Carb Counting, Carb Choices; Know Your A1C; signs/symptoms of Hypo/Hyperglycemia . Advised patient, providing education and rationale, to check cbg daily before meals and record, calling the CCM team and or PCP for findings outside established parameters.    Patient Self Care Activities:  . Patient verbalizes understanding of plan to reviewed printed Diabetes education materials sent via mail and review with CCM RN CM at next scheduled follow up call  . Self administers medications as prescribed . Attends all scheduled provider appointments . Calls pharmacy for medication refills . Performs ADL's independently . Performs IADL's independently . Calls provider office for new concerns or questions   Please see past updates related to this goal by clicking on the "Past Updates" button in the selected goal      .  "I would like to keep my BP under good control" (pt-stated)      Current Barriers:  [name] Knowledge  Deficits related to disease process and Self Health management for Hypertension . Chronic Disease Management support and education needs related to Hypertension  Nurse Case Manager Clinical Goal(s):  [name] Over the next 90 days, patient will work with the CCM team and PCP to address needs related to Hypertension   CCM RN CM Interventions:  06/06/19 call completed with patient   . Evaluation of current treatment plan related to Hypertension  and patient's adherence to plan as established by provider. . Provided education to patient re: target BP of 130/80; discussed importance of avoiding table salt, canned foods and fast food  . Reviewed medications with patient and discussed patient is adhering to his prescribed regimen; Enalapril, Amlodipine . Discussed plans with patient for ongoing care management follow up and provided patient with direct contact information for care management team . Advised patient, providing education and rationale, to monitor blood pressure daily and record, calling the CCM and or PCP for findings outside established parameters.  . Provided patient with printed  educational materials related to What is High Blood Pressure; African-Americans and High Blood Pressure; Why should I restrict my Sodium; Life's Simple 7  Patient Self Care Activities:  . Patient verbalizes understanding of plan to reviewed printed education materials related to High Blood Pressure to review with CCM RN CM at next scheduled follow up call  . Self administers medications as prescribed . Attends all scheduled provider appointments . Calls pharmacy for medication refills . Performs ADL's independently . Performs IADL's independently . Calls provider office for new concerns or questions   Initial goal documentation     .  I would like to optimize my medication management of my chronic conditions (diabetes, hypertension, hyperlipidemia) (pt-stated)      Current Barriers:  . Diabetes: T2DM; most  recent A1c 6.8% on 3.17.21 o Current antihyperglycemic regimen:  metformin 1g BID (LF 8/10 #90), pioglitazone 97m (  LF 8/14 #90), [name] 20 units, Trulicity 1.6XW weekly; Scr 1.11 o Patient approved for [employer]-[name]/trulicity, medication delivered to patient's home 07/20/19 - Dose change form sent to [facility] for Trulicity 9.6EA weekly - Discontinue pioglitazone once refill complete--d/c'd from med list . Current meal patterns: o Breakfast: eggs, bacon, toast o Lunch: sandwiches, burgers  o Supper: protein, veggie, carbs(potatoes, fries, etc) o Snacks: encouraged low sugar snacks, veggies o Drinks: recommended patient avoid sugary drinks; make water the mainstay; tea/coffee (low sugar) . Current exercise: n/a . Current blood glucose readings: FBG<150 (goal <130) . Cardiovascular risk reduction: o Current hypertensive regimen: enalapril 33m QD (LF 9/24 #30), amlodipine 166m(in combo with atorvastatin-Brand name Caduet) o Current hyperlipidemia regimen: amlodipine/atorvas 10-2014mDL 106  Pharmacist Clinical Goal(s):  . OMarland Kitchener the next 90 days, patient with work with PharmD and primary care provider to address needs related to optimized medication management of chronic conditions.  Interventions: . Comprehensive medication review performed, medication list updated in electronic medical record . Reviewed & discussed the following diabetes-related information with patient: o Follow ADA recommended "diabetes-friendly" diet  (reviewed healthy snack/food options) o Discussed insulin/GLP-1 injection technique o Reviewed medication purpose/side effects-->patient denies adverse events  Patient Self Care Activities:  . Patient will check blood glucose daily , document, and provide at future appointments . Patient will focus on medication adherence by continuing to take medications as prescribed; complete application for NovoNordisk patient assistance . Patient will take medications as  prescribed . Patient will contact provider with any episodes of hypoglycemia . Patient will report any questions or concerns to provider   Please see past updates related to this goal by clicking on the "Past Updates" button in the selected goal      .  Weight (lb) < 200 lb (90.7 kg)       Depression Screen PHQ 2/9 Scores 11/30/2019 04/25/2019 03/17/2019 11/24/2018  PHQ - 2 Score 0 0 0 0     Fall Risk Fall Risk  11/30/2019  Falls in the past year? 0  Risk for fall due to : -    Cognitive Function     6CIT Screen 11/30/2019  What Year? 0 points  What month? 0 points  What time? 0 points  Count back from 20 0 points  Months in reverse 2 points  Repeat phrase 0 points  Total Score 2    Patient Care Team: [place] ChardD as PCP - General (Internal Medicine) [employer]Rex KrasngClaudette StaplerN as Case Manager Hum[employer]aneen Schick Social Worker     Plan:  1. Welcome to Medicare preventive visit  A full exam was performed. The annual wellness visit was performed including discussion of advanced directives, assessment of functional status and cognitive function. PATIENT IS ADVISED TO GET 30-45 MINUTES REGULAR EXERCISE NO LESS THAN FOUR TO FIVE DAYS PER WEEK - BOTH WEIGHTBEARING EXERCISES AND AEROBIC ARE RECOMMENDED.  HE IS ADVISED TO FOLLOW A HEALTHY DIET WITH AT LEAST SIX FRUITS/VEGGIES PER DAY, DECREASE INTAKE OF RED MEAT, AND TO INCREASE FISH INTAKE TO TWO DAYS PER WEEK.  MEATS/FISH SHOULD NOT BE FRIED, BAKED OR BROILED IS PREFERABLE.  I SUGGEST WEARING SPF 50 SUNSCREEN ON EXPOSED PARTS AND ESPECIALLY WHEN IN THE DIRECT SUNLIGHT FOR AN EXTENDED PERIOD OF TIME.  PLEASE AVOID FAST FOOD RESTAURANTS AND INCREASE YOUR WATER INTAKE.   2. Type 2 diabetes mellitus with stage 2 chronic kidney disease, without long-term current use of insulin (HCCPanamaDiabetic foot exam was performed. I DISCUSSED  WITH THE PATIENT AT LENGTH REGARDING THE GOALS OF GLYCEMIC CONTROL AND POSSIBLE LONG-TERM COMPLICATIONS.  I  ALSO  STRESSED THE IMPORTANCE OF COMPLIANCE WITH HOME GLUCOSE MONITORING, DIETARY RESTRICTIONS INCLUDING AVOIDANCE OF SUGARY DRINKS/PROCESSED FOODS,  ALONG WITH REGULAR EXERCISE.  I  ALSO STRESSED THE IMPORTANCE OF ANNUAL EYE EXAMS, SELF FOOT CARE AND COMPLIANCE WITH OFFICE VISITS.  - POCT Urinalysis Dipstick (81002) - POCT UA - Microalbumin - EKG 12-Lead - CMP14+EGFR - CBC - Lipid panel - Hemoglobin A1c - PSA, total and free  3. Hypertensive nephropathy  Chronic, fair control.  He will continue with current meds for now. Encouraged to avoid adding salt to his foods. EKG performed, NSR w/o acute changes.   4. Nocturia  DRE performed, stool heme negative.   - PSA, total and free - POC Hemoccult Bld/Stl (1-Cd Office Dx)   I have personally reviewed and noted the following in the patient's chart:   . Medical and social history . Use of alcohol, tobacco or illicit drugs  . Current medications and supplements . Functional ability and status . Nutritional status . Physical activity . Advanced directives . List of other physicians . Hospitalizations, surgeries, and ER visits in previous 12 months . Vitals . Screenings to include cognitive, depression, and falls . Referrals and appointments  In addition, I have reviewed and discussed with patient certain preventive protocols, quality metrics, and best practice recommendations. A written personalized care plan for preventive services as well as general preventive health recommendations were provided to patient.    [name], MD 12/18/2019

## 2019-11-30 NOTE — Patient Instructions (Signed)
Mr. Polinski , Thank you for taking time to come for your Medicare Wellness Visit. I appreciate your ongoing commitment to your health goals. Please review the following plan we discussed and let me know if I can assist you in the future.   These are the goals we discussed: Goals    . "I would like to continue to work on lowering my A1C" (pt-stated)     Current Barriers:  [name] Knowledge Deficits related to Diabetes disease process and Self Health managment . Chronic Disease Management support and education needs related to Diabetes Mellitus  Nurse Case Manager Clinical Goal(s):  [name] Over the next 90 days, patient will work with the CCM team to address needs related to Diabetes disease management , support and education to help improve his daily glycemic control and lower his A1C . Over the next 90 days, patient will lower his A1C <7.7 Goal Met 3/17 . New 11/25/19- Over the next 120 days the patient will continue to optimize management of DM II as evidenced by continuing to follow DM diet with A1C results remaining below 7.0  CCM SW Interventions: Completed 11/25/19 . Performed chart review to note continued decline in A1C. Most recent result from 3/17 indicated A1C of 6.9 . Successful outbound call placed to the patient to review care coordination needs . Confirmed patient is continuing to follow DM regimen as outlined below by [facility] . Congratulated patient for continued improvement in A1C and for meeting goal . Encouraged the patient to contact care management team as needed . Scheduled follow up call with RN Care Manager over the next 60 days  CCM RN CM Interventions:  06/06/19 call completed with patient   . Evaluation of current treatment plan related to Diabetes and patient's adherence to plan as established by provider. . Provided education to patient re: most recent A1C of 7.7 obtained on 05/30/19; educated on target A1C of <7.0; education on importance or adhering to following a  Diabetic friendly diet and implementing exercise into his daily routine; educated patient on target daily glycemic control 80-130  . Reviewed medications with patient and discussed patient is adhering to his prescribed treatment plan; Metformin, Xultophy . Discussed plans with patient for ongoing care management follow up and provided patient with direct contact information for care management team . Provided patient with printed educational materials related to Diabetes Management Safety Zone Tool, Managing Your Diabetes using the Plate Method; Carb Counting, Carb Choices; Know Your A1C; signs/symptoms of Hypo/Hyperglycemia . Advised patient, providing education and rationale, to check cbg daily before meals and record, calling the CCM team and or PCP for findings outside established parameters.    Patient Self Care Activities:  . Patient verbalizes understanding of plan to reviewed printed Diabetes education materials sent via mail and review with CCM RN CM at next scheduled follow up call  . Self administers medications as prescribed . Attends all scheduled provider appointments . Calls pharmacy for medication refills . Performs ADL's independently . Performs IADL's independently . Calls provider office for new concerns or questions   Please see past updates related to this goal by clicking on the "Past Updates" button in the selected goal      . "I would like to keep my BP under good control" (pt-stated)     Current Barriers:  [name] Knowledge Deficits related to disease process and Self Health management for Hypertension . Chronic Disease Management support and education needs related to Hypertension  Nurse Case Manager  Clinical Goal(s):  [name] Over the next 90 days, patient will work with the CCM team and PCP to address needs related to Hypertension   CCM RN CM Interventions:  06/06/19 call completed with patient   . Evaluation of current treatment plan related to Hypertension  and  patient's adherence to plan as established by provider. . Provided education to patient re: target BP of 130/80; discussed importance of avoiding table salt, canned foods and fast food  . Reviewed medications with patient and discussed patient is adhering to his prescribed regimen; Enalapril, Amlodipine . Discussed plans with patient for ongoing care management follow up and provided patient with direct contact information for care management team . Advised patient, providing education and rationale, to monitor blood pressure daily and record, calling the CCM and or PCP for findings outside established parameters.  . Provided patient with printed  educational materials related to What is High Blood Pressure; African-Americans and High Blood Pressure; Why should I restrict my Sodium; Life's Simple 7  Patient Self Care Activities:  . Patient verbalizes understanding of plan to reviewed printed education materials related to High Blood Pressure to review with CCM RN CM at next scheduled follow up call  . Self administers medications as prescribed . Attends all scheduled provider appointments . Calls pharmacy for medication refills . Performs ADL's independently . Performs IADL's independently . Calls provider office for new concerns or questions   Initial goal documentation     . I would like to optimize my medication management of my chronic conditions (diabetes, hypertension, hyperlipidemia) (pt-stated)     Current Barriers:  . Diabetes: T2DM; most recent A1c 6.8% on 3.17.21 o Current antihyperglycemic regimen:  metformin 1g BID (LF 8/10 #90), pioglitazone 88m (LF 8/14 #90), [name] 20 units, Trulicity 16.3KZweekly; Scr 1.11 o Patient approved for [employer]-[name]/trulicity, medication delivered to patient's home 07/20/19 - [place] 16.[place] - Discontinue pioglitazone once refill complete--d/c'd from med list . Current meal  patterns: o Breakfast: eggs, bacon, toast o Lunch: sandwiches, burgers  o Supper: protein, veggie, carbs(potatoes, fries, etc) o Snacks: encouraged low sugar snacks, veggies o Drinks: recommended patient avoid sugary drinks; make water the mainstay; tea/coffee (low sugar) . Current exercise: n/a . Current blood glucose readings: FBG<150 (goal <130) . Cardiovascular risk reduction: o Current hypertensive regimen: enalapril 568mQD (LF 9/24 #30), amlodipine 104min combo with atorvastatin-Brand name Caduet) o Current hyperlipidemia regimen: amlodipine/atorvas 10-30m41mL 106  Pharmacist Clinical Goal(s):  . OvMarland Kitchenr the next 90 days, patient with work with PharmD and primary care provider to address needs related to optimized medication management of chronic conditions.  Interventions: . Comprehensive medication review performed, medication list updated in electronic medical record . Reviewed & discussed the following diabetes-related information with patient: o Follow ADA recommended "diabetes-friendly" diet  (reviewed healthy snack/food options) o Discussed insulin/GLP-1 injection technique o Reviewed medication purpose/side effects-->patient denies adverse events  Patient Self Care Activities:  . Patient will check blood glucose daily , document, and provide at future appointments . Patient will focus on medication adherence by continuing to take medications as prescribed; complete application for Novo[employer]ordisk patient assistance . Patient will take medications as prescribed . Patient will contact provider with any episodes of hypoglycemia . Patient will report any questions or concerns to provider   Please see past updates related to this goal by clicking on the "Past Updates" button in the selected goal      . Weight (  lb) < 200 lb (90.7 kg)       This is a list of the screening recommended for you and due dates:  Health Maintenance  Topic Date Due  . COVID-19 Vaccine (1) Never done   . Eye exam for diabetics  08/10/2019  . Complete foot exam   11/24/2019  . HIV Screening  09/06/2020*  . Flu Shot  01/22/2020  . Hemoglobin A1C  03/09/2020  . Colon Cancer Screening  08/22/2024  . Tetanus Vaccine  12/13/2024  .  Hepatitis C: One time screening is recommended by Center for Disease Control  (CDC) for  adults born from 18 through 1967.   Completed  . Pneumonia vaccines  Completed  *Topic was postponed. The date shown is not the original due date.

## 2019-12-01 LAB — CMP14+EGFR
ALT: 11 IU/L (ref 0–44)
AST: 22 IU/L (ref 0–40)
Albumin/Globulin Ratio: 1.4 (ref 1.2–2.2)
Albumin: 4.5 g/dL (ref 3.8–4.8)
Alkaline Phosphatase: 116 IU/L (ref 48–121)
BUN/Creatinine Ratio: 9 — ABNORMAL LOW (ref 10–24)
BUN: 11 mg/dL (ref 8–27)
Bilirubin Total: 0.5 mg/dL (ref 0.0–1.2)
CO2: 24 mmol/L (ref 20–29)
Calcium: 9.9 mg/dL (ref 8.6–10.2)
Chloride: 101 mmol/L (ref 96–106)
Creatinine, Ser: 1.22 mg/dL (ref 0.76–1.27)
GFR calc Af Amer: 71 mL/min/{1.73_m2} (ref >59)
GFR calc non Af Amer: 62 mL/min/{1.73_m2} (ref >59)
Globulin, Total: 3.3 g/dL (ref 1.5–4.5)
Glucose: 75 mg/dL (ref 65–99)
Potassium: 4.1 mmol/L (ref 3.5–5.2)
Sodium: 138 mmol/L (ref 134–144)
Total Protein: 7.8 g/dL (ref 6.0–8.5)

## 2019-12-01 LAB — PSA, TOTAL AND FREE
PSA, Free Pct: 20 %
PSA, Free: 0.12 ng/mL
Prostate Specific Ag, Serum: 0.6 ng/mL (ref 0.0–4.0)

## 2019-12-01 LAB — LIPID PANEL
Chol/HDL Ratio: 3.3 ratio (ref 0.0–5.0)
Cholesterol, Total: 168 mg/dL (ref 100–199)
HDL: 51 mg/dL (ref >39)
LDL Chol Calc (NIH): 104 mg/dL — ABNORMAL HIGH (ref 0–99)
Triglycerides: 70 mg/dL (ref 0–149)
VLDL Cholesterol Cal: 13 mg/dL (ref 5–40)

## 2019-12-01 LAB — CBC
Hematocrit: 38.9 % (ref 37.5–51.0)
Hemoglobin: 12.8 g/dL — ABNORMAL LOW (ref 13.0–17.7)
MCH: 27.7 pg (ref 26.6–33.0)
MCHC: 32.9 g/dL (ref 31.5–35.7)
MCV: 84 fL (ref 79–97)
Platelets: 245 10*3/uL (ref 150–450)
RBC: 4.62 x10E6/uL (ref 4.14–5.80)
RDW: 13 % (ref 11.6–15.4)
WBC: 5.4 10*3/uL (ref 3.4–10.8)

## 2019-12-01 LAB — HEMOGLOBIN A1C
Est. average glucose Bld gHb Est-mCnc: 146 mg/dL
Hgb A1c MFr Bld: 6.7 % — ABNORMAL HIGH (ref 4.8–5.6)

## 2019-12-02 ENCOUNTER — Other Ambulatory Visit: Payer: Self-pay

## 2019-12-02 ENCOUNTER — Other Ambulatory Visit: Payer: Self-pay | Admitting: Internal Medicine

## 2019-12-02 MED ORDER — BASAGLAR KWIKPEN 100 UNIT/ML ~~LOC~~ SOPN
22.0000 [IU] | PEN_INJECTOR | Freq: Every day | SUBCUTANEOUS | 3 refills | Status: DC
Start: 2019-12-02 — End: 2023-03-10

## 2019-12-02 NOTE — Telephone Encounter (Signed)
The pt's wife said that the pt is trying to get patient assistance for the basaglar.  Mrs Harless Litten notified that the pt has samples of basaglar ready for pickup.

## 2019-12-14 ENCOUNTER — Telehealth: Payer: Self-pay

## 2019-12-14 NOTE — Telephone Encounter (Signed)
I called the pt back to give him the information to [employer] patient assistance program so that the pt can call for his refill. [phone]

## 2019-12-18 ENCOUNTER — Encounter: Payer: Self-pay | Admitting: Internal Medicine

## 2019-12-27 ENCOUNTER — Ambulatory Visit: Payer: Self-pay

## 2019-12-27 DIAGNOSIS — E1122 Type 2 diabetes mellitus with diabetic chronic kidney disease: Secondary | ICD-10-CM

## 2019-12-27 DIAGNOSIS — I10 Essential (primary) hypertension: Secondary | ICD-10-CM

## 2019-12-27 DIAGNOSIS — N182 Chronic kidney disease, stage 2 (mild): Secondary | ICD-10-CM

## 2019-12-27 NOTE — Chronic Care Management (AMB) (Signed)
   Chronic Care Management Pharmacy  Name: William Mcguire  MRN: [id number] DOB: [birth year]  Chief Complaint/ HPI  Contacted [employer] patient assistance program regarding shipment status for [place]. A shipment of Trulicity was delivered to patient's home on 12/13/19. A [name] shipment will be processed today and will be delivered to the patient's home on 12/30/19. Called to notify patient of the status of the [place] shipment and verified that he will not run out of insulin before that date.   PCP : [name], MD   Follow up: Initial visit to be scheduled  Jannette Fogo, PharmD Clinical Pharmacist [facility] [phone]

## 2019-12-28 ENCOUNTER — Telehealth: Payer: Self-pay | Admitting: Internal Medicine

## 2019-12-28 NOTE — Chronic Care Management (AMB) (Signed)
  Chronic Care Management   Note  12/28/2019 Name: William Mcguire MRN: [id number] DOB: 12/07/1955  William Mcguire is a 64 y.o. year old male who is a primary care patient of [name], MD and is actively engaged with the care management team. I reached out to [name] by phone today to assist with scheduling an initial visit with the Pharmacist.  Follow up plan: Unsuccessful telephone outreach attempt made. A HIPPA compliant phone message was left for the patient providing contact information and requesting a return call. The care management team will reach out to the patient again over the next 7 days. If patient returns call to provider office, please advise to call [place] at [phone].  [place], [place] [address] Direct Dial: [phone] [email or web address]: [email or web address]

## 2019-12-29 NOTE — Chronic Care Management (AMB) (Signed)
  Chronic Care Management   Note  12/29/2019 Name: Shermar Friedland MRN: [id number] DOB: 07/01/1955  Zorion Nims is a 64 y.o. year old male who is a primary care patient of [name], MD and is actively engaged with the care management team. I reached out to [name] by phone today to assist with scheduling an initial visit with the Pharmacist.  Mr. Dobek was given information about Chronic Care Management services today including:  1. CCM service includes personalized support from designated clinical staff supervised by his physician, including individualized plan of care and coordination with other care providers 2. 24/7 contact phone numbers for assistance for urgent and routine care needs. 3. Service will only be billed when office clinical staff spend 20 minutes or more in a month to coordinate care. 4. Only one practitioner may furnish and bill the service in a calendar month. 5. The patient may stop CCM services at any time (effective at the end of the month) by phone call to the office staff. 6. The patient will be responsible for cost sharing (co-pay) of up to 20% of the service fee (after annual deductible is met).  Patient agreed to services and verbal consent obtained.   Follow up plan: Telephone appointment with care management team member scheduled for: 01/30/2020.  [place], [place] [address] Direct Dial: [phone] [email or web address] Website: [email or web address]

## 2020-01-09 ENCOUNTER — Other Ambulatory Visit: Payer: Self-pay

## 2020-01-09 NOTE — Telephone Encounter (Signed)
I returned the pt's call to see if he needed One Touch Verio or One Tax adviser test strips sent the pharmacy.

## 2020-01-12 ENCOUNTER — Ambulatory Visit: Payer: Self-pay

## 2020-01-12 ENCOUNTER — Other Ambulatory Visit: Payer: Self-pay

## 2020-01-12 ENCOUNTER — Telehealth: Payer: Medicare Other

## 2020-01-12 DIAGNOSIS — E1122 Type 2 diabetes mellitus with diabetic chronic kidney disease: Secondary | ICD-10-CM

## 2020-01-12 DIAGNOSIS — N182 Chronic kidney disease, stage 2 (mild): Secondary | ICD-10-CM

## 2020-01-12 DIAGNOSIS — I10 Essential (primary) hypertension: Secondary | ICD-10-CM

## 2020-01-13 NOTE — Chronic Care Management (AMB) (Signed)
  Chronic Care Management   Follow Up Note   01/13/2020 Name: William Mcguire MRN: [id number] DOB: July 23, 1955  Referred by: William Chard, MD Reason for referral : Chronic Care Management (FU RN CM Chart Review)   William Mcguire is a 64 y.o. year old male who is a primary care patient of William Chard, MD. The CCM team was consulted for assistance with chronic disease management and care coordination needs.    Review of patient status, including review of consultants reports, relevant laboratory and other test results, and collaboration with appropriate care team members and the patient's provider was performed as part of comprehensive patient evaluation and provision of chronic care management services.    SDOH (Social Determinants of Health) assessments performed: No See Care Plan activities for detailed interventions related to [facility])   Reviewed chart in preparation to contact patient to assess for CCM needs.    Outpatient Encounter Medications as of 01/12/2020  Medication Sig  . amlodipine-atorvastatin (CADUET) 10-20 MG tablet TAKE 1 TABLET BY MOUTH EVERY DAY  . aspirin EC 81 MG tablet Take 81 mg by mouth daily.    . cyanocobalamin (,VITAMIN B-12,) 1000 MCG/ML injection INJECT 1 ML INTRAMUSCULARLY WEEKLY FOR 3 WEEKS, THEN USE ONCE MONTHLY (Patient taking differently: 1,000 mcg every 30 (thirty) days. )  . Dulaglutide (TRULICITY) 2.53 GU/4.4IH SOPN Inject 0.75 mg into the skin once a week.  . enalapril (VASOTEC) 10 MG tablet Take 1 tablet (10 mg total) by mouth daily.  . Insulin Glargine ([name] KWIKPEN) 100 UNIT/ML Inject 0.22 mLs (22 Units total) into the skin at bedtime.  . Insulin Pen Needle (B-D ULTRAFINE III SHORT PEN) 31G X 8 MM MISC USE AS DIRECTED  . metFORMIN (GLUCOPHAGE) 1000 MG tablet TAKE 1 TABLET BY MOUTH TWICE A DAY WITH A MEAL  . sildenafil (VIAGRA) 100 MG tablet TAKE 1 TABLET BY MOUTH EVERY DAY AS NEEDED  . SYRINGE-NEEDLE, DISP, 3 ML (B-D 3CC LUER-LOK SYR 25GX5/8")  25G X 5/8" 3 ML MISC USE AS DIRECTED   No facility-administered encounter medications on file as of 01/12/2020.     Objective:  Lab Results  Component Value Date   HGBA1C 6.7 (H) 11/30/2019   HGBA1C 6.9 (H) 09/07/2019   HGBA1C 7.7 (H) 05/30/2019   Lab Results  Component Value Date   MICROALBUR 30 11/30/2019   LDLCALC 104 (H) 11/30/2019   CREATININE 1.22 11/30/2019   BP Readings from Last 3 Encounters:  11/30/19 (!) 146/88  09/07/19 132/80  05/30/19 136/88   Plan:   Telephone follow up appointment with care management team member scheduled for: 03/05/20  Barb Merino, RN, BSN, CCM Care Management Coordinator [facility]  Direct Phone: [phone]

## 2020-01-17 ENCOUNTER — Other Ambulatory Visit: Payer: Self-pay

## 2020-01-17 DIAGNOSIS — E1122 Type 2 diabetes mellitus with diabetic chronic kidney disease: Secondary | ICD-10-CM

## 2020-01-17 DIAGNOSIS — N182 Chronic kidney disease, stage 2 (mild): Secondary | ICD-10-CM

## 2020-01-17 MED ORDER — GLUCOSE BLOOD VI STRP: ORAL_STRIP | 12 refills | Status: DC

## 2020-01-19 ENCOUNTER — Other Ambulatory Visit: Payer: Self-pay

## 2020-01-19 ENCOUNTER — Other Ambulatory Visit: Payer: Self-pay | Admitting: Internal Medicine

## 2020-01-19 DIAGNOSIS — E1122 Type 2 diabetes mellitus with diabetic chronic kidney disease: Secondary | ICD-10-CM

## 2020-01-19 DIAGNOSIS — N182 Chronic kidney disease, stage 2 (mild): Secondary | ICD-10-CM

## 2020-01-19 MED ORDER — GLUCOSE BLOOD VI STRP: ORAL_STRIP | 12 refills | Status: DC

## 2020-01-27 ENCOUNTER — Telehealth: Payer: Self-pay

## 2020-01-27 NOTE — Chronic Care Management (AMB) (Signed)
Chronic Care Management Pharmacy Assistant   Name: William Mcguire  MRN: [id number] DOB: 08/10/1955  Reason for Encounter: Medication Review/ Initial Questions for Pharmacist visit 01/30/20.  PCP : [name], MD  Allergies:  No Known Allergies  Medications: Outpatient Encounter Medications as of 01/27/2020  Medication Sig  . amlodipine-atorvastatin (CADUET) 10-20 MG tablet TAKE 1 TABLET BY MOUTH EVERY DAY  . aspirin EC 81 MG tablet Take 81 mg by mouth daily.    . cyanocobalamin (,VITAMIN B-12,) 1000 MCG/ML injection INJECT 1 ML INTRAMUSCULARLY WEEKLY FOR 3 WEEKS, THEN USE ONCE MONTHLY (Patient taking differently: 1,000 mcg every 30 (thirty) days. )  . Dulaglutide (TRULICITY) 7.12 WP/8.0DX SOPN Inject 0.75 mg into the skin once a week.  . enalapril (VASOTEC) 10 MG tablet Take 1 tablet (10 mg total) by mouth daily.  [name] glucose blood test strip Use as instructed to test blood sugar 3 times a day. Dx code: e11.65  . Insulin Glargine ([name] KWIKPEN) 100 UNIT/ML Inject 0.22 mLs (22 Units total) into the skin at bedtime.  . Insulin Pen Needle (B-D ULTRAFINE III SHORT PEN) 31G X 8 MM MISC USE AS DIRECTED  . metFORMIN (GLUCOPHAGE) 1000 MG tablet TAKE 1 TABLET BY MOUTH TWICE A DAY WITH A MEAL  . sildenafil (VIAGRA) 100 MG tablet TAKE 1 TABLET BY MOUTH EVERY DAY AS NEEDED  . SYRINGE-NEEDLE, DISP, 3 ML (B-D 3CC LUER-LOK SYR 25GX5/8") 25G X 5/8" 3 ML MISC USE AS DIRECTED   No facility-administered encounter medications on file as of 01/27/2020.    Current Diagnosis: Patient Active Problem List   Diagnosis Date Noted  . Hypertensive nephropathy 08/05/2018  . Class 1 obesity due to excess calories with serious comorbidity and body mass index (BMI) of 32.0 to 32.9 in adult 08/05/2018  . Pyelonephritis 06/23/2011  . AKI (acute kidney injury) ([place]) 06/23/2011  . Diabetes mellitus 06/23/2011  . HTN (hypertension) 06/23/2011   Patient Questions:   Have you seen any other providers since  your last visit? No   Any changes in your medicines or health? No  Any side effects from any medications? no Do you have an symptoms or problems not managed by your medications? no Any concerns about your health right now? no Has your provider asked that you check blood pressure, blood sugar, or follow special diet at home? Yes- Patient checks his blood sugars every morning before eating and he states reading can range from 120 to 150. Never over 150. Patient may check his blood pressure every 3-4 weeks. Do you get any type of exercise on a regular basis? Yes- Patient states he walks 3-4 miles every day.  Can you think of a goal you would like to reach for your health? Weight loss and get off some of Diabetes medications. Do you have any problems getting your medications? No- [facility] uses [facility] [address], he does not have any complaint with pharmacy but I did give some information on [facility] pharmacy adherence program and he is interested in talking more with pharmacist about it.  Is there anything that you would like to discuss during the appointment? Getting off of some of his Diabetes medications.  Patient aware to please bring medications and supplements to appointment or have with during phone call.  Medications reviewed with patient: Metformin 1000 mg Enalapril 10 mg Trulicity 8.33 AS/5.0 ml Amlodipine-atorvastatin 10/20mg  Vitamin B 12 ASA 81mg    Follow-Up:  Pharmacist Review for Initial visit on 01/30/20.  Pattricia Boss, [place] Pharmacist Assistant [phone]

## 2020-01-30 ENCOUNTER — Other Ambulatory Visit: Payer: Self-pay

## 2020-01-30 ENCOUNTER — Ambulatory Visit (INDEPENDENT_AMBULATORY_CARE_PROVIDER_SITE_OTHER): Payer: Medicare Other

## 2020-01-30 DIAGNOSIS — I10 Essential (primary) hypertension: Secondary | ICD-10-CM

## 2020-01-30 DIAGNOSIS — E1122 Type 2 diabetes mellitus with diabetic chronic kidney disease: Secondary | ICD-10-CM

## 2020-01-30 DIAGNOSIS — N182 Chronic kidney disease, stage 2 (mild): Secondary | ICD-10-CM

## 2020-01-30 NOTE — Chronic Care Management (AMB) (Signed)
Chronic Care Management Pharmacy  Name: William Mcguire  MRN: [id number] DOB: 27-Apr-1956  Chief Complaint/ HPI  William Mcguire,  64 y.o. , male presents for their Initial CCM visit with the clinical pharmacist via telephone due to COVID-19 Pandemic.  PCP : William Chard, MD  Their chronic conditions include: Hypertension, Hyperlipidemia and Diabetes  Office Visits: 11/30/19 OV: Welcome to Medicare preventative visit. Diabetic foot exam performed. Kidney and liver function stable. Blood count slightly low. DRE performed, stool heme negative. Prostate test normal. Need to increase amlodipine/atorvastatin to 10/23m daily to reach LDL goal <70.   09/07/19 OV: Pt advised to stop Actos when he runs out of current prescription. Plan to increase to Trulicity 15.9DGweekly at that point (pt would inject 0.769mtwice one a week until he runs out). Kidney function is stable. HgbA1c down to 6.9%. Will check B12 at next visit. Importance of exercise discussed.   Consult Visits: 07/14/19 [facility] OV w/ Dr. HiJunius RoadsBilateral Synvisc injections for knee osteoarthritis. Repeat in 6 months if needed  CCM Encounters: 09/07/19 PharmD: Dose change form sent to [facility] for Trulicity 1.3.8VFeekly.   Medications: Outpatient Encounter Medications as of 01/30/2020  Medication Sig  . amlodipine-atorvastatin (CADUET) 10-20 MG tablet TAKE 1 TABLET BY MOUTH EVERY DAY  . aspirin EC 81 MG tablet Take 81 mg by mouth daily.    . cyanocobalamin (,VITAMIN B-12,) 1000 MCG/ML injection INJECT 1 ML INTRAMUSCULARLY WEEKLY FOR 3 WEEKS, THEN USE ONCE MONTHLY (Patient taking differently: 1,000 mcg every 30 (thirty) days. )  . Dulaglutide (TRULICITY) 1.5 MGIE/3.3IROPN Inject 1.5 mg into the skin once a week.   . enalapril (VASOTEC) 10 MG tablet Take 1 tablet (10 mg total) by mouth daily.  . Marland Kitchenlucose blood test strip Use as instructed to test blood sugar 3 times a day. Dx code: e11.65  . Insulin Glargine ([name] KWIKPEN) 100  UNIT/ML Inject 0.22 mLs (22 Units total) into the skin at bedtime.  . Insulin Pen Needle (B-D ULTRAFINE III SHORT PEN) 31G X 8 MM MISC USE AS DIRECTED  . metFORMIN (GLUCOPHAGE) 1000 MG tablet TAKE 1 TABLET BY MOUTH TWICE A DAY WITH A MEAL (Patient taking differently: Take 1,000 mg by mouth daily with breakfast. TAKE 1 TABLET BY MOUTH TWICE A DAY WITH A MEAL)  . sildenafil (VIAGRA) 100 MG tablet TAKE 1 TABLET BY MOUTH EVERY DAY AS NEEDED  . SYRINGE-NEEDLE, DISP, 3 ML (B-D 3CC LUER-LOK SYR 25GX5/8") 25G X 5/8" 3 ML MISC USE AS DIRECTED   No facility-administered encounter medications on file as of 01/30/2020.    Current Diagnosis/Assessment:  SDOH Interventions     Most Recent Value  SDOH Interventions  Financial Strain Interventions Other (Comment)  [Will continue to assist with Trulicity and [name] patient assistance programs as needed]      Goals Addressed            This Visit's Progress   . Pharmacy Care Plan       CARE PLAN ENTRY (see longitudinal plan of care for additional care plan information)  Current Barriers:  . Chronic Disease Management support, education, and care coordination needs related to Hypertension, Hyperlipidemia, and Diabetes   Hypertension BP Readings from Last 3 Encounters:  11/30/19 (!) 146/88  09/07/19 132/80  05/30/19 136/88   . Pharmacist Clinical Goal(s): o Over the next 180 days, patient will work with PharmD and providers to maintain BP goal <130/80 . Current regimen:  . Enalapril 1091maily   Amlodipine-  atorvastatin 10-20mg daily . Interventions: o Provided dietary and exercise recommendations . Patient self care activities - Over the next 180 days, patient will: o Check BP once monthly, document, and provide at future appointments o Ensure daily salt intake < 2300 mg/day o Use alternative seasonings besides salt (garlic, pepper, etc) o Exercise at least 30 minutes daily 5 times per week (150 minutes per week)  Hyperlipidemia Lab  Results  Component Value Date/Time   LDLCALC 104 (H) 11/30/2019 10:40 AM   . Pharmacist Clinical Goal(s): o Over the next 90 days, patient will work with PharmD and providers to achieve LDL goal < 70 . Current regimen:  o Amlodipine- atorvastatin 10-20mg daily . Interventions: o Provided dietary and exercise recommendations o Collaborate with PCP to send in new prescription for amlodipine-atorvastatin 10-40mg as discussed at last office visit . Patient self care activities - Over the next 90 days, patient will: o Exercise at least 30 minutes daily 5 times per week (150 minutes per week) o Begin taking increased dose of amlodipine-atorvastatin when sent in by PCP  Diabetes Lab Results  Component Value Date/Time   HGBA1C 6.7 (H) 11/30/2019 10:40 AM   HGBA1C 6.9 (H) 09/07/2019 10:51 AM   . Pharmacist Clinical Goal(s): o Over the next 180 days, patient will work with PharmD and providers to maintain A1c goal <7% . Current regimen:  . Trulicity 0.7EM weekly . [name] 22 units at bedtime . Metformin 1049m twice daily with a meal . Interventions: o Provided dietary and exercise recommendations o Patient was only taking metformin once daily. Advised patient to resume twice daily dosing as directed o Discussed patient assistance for Trulicity and BEngineer, agricultural. Patient self care activities - Over the next 180 days, patient will: o Check blood sugar once daily, document, and provide at future appointments o Contact provider with any episodes of hypoglycemia o Exercise at least 30 minutes daily 5 times per week (150 minutes per week) o Start taking metformin twice daly with meals  Medication management . Pharmacist Clinical Goal(s): o Over the next 180 days, patient will work with PharmD and providers to maintain optimal medication adherence . [facility] . Interventions o Comprehensive medication review performed. o Continue current medication management strategy . Patient  self care activities - Over the next 180 days, patient will: o Focus on medication adherence by considering adherence packaging and medication synchronization o Take medications as prescribed o Report any questions or concerns to PharmD and/or provider(s)  Initial goal documentation        Diabetes   A1c goal <7%  Recent Relevant Labs: Lab Results  Component Value Date/Time   HGBA1C 6.7 (H) 11/30/2019 10:40 AM   HGBA1C 6.9 (H) 09/07/2019 10:51 AM   MICROALBUR 30 11/30/2019 09:55 AM   MICROALBUR 80 03/17/2019 09:39 AM    Last diabetic Eye exam:  Lab Results  Component Value Date/Time   HMDIABEYEEXA No Retinopathy 08/09/2018 12:00 AM    Last diabetic Foot exam: 11/30/19  Checking BG: Daily  Recent FBG Readings: 90-120 (never above 150) Recent pre-meal BG readings:  Recent 2hr PP BG readings:   Recent HS BG readings:   Patient has failed these meds in past: Actos, Victoza, Xultophy,  Patient is currently controlled on the following medications: . Trulicity 17.5QGweekly . [name] 22 units at bedtime . Metformin 10064mtwice daily with a meal . Aspirin 8186maily  We discussed:  . Trying to lose weight and get HgbA1c down . Pt states  he has only been taking metformin once daily in the morning for the past year  Pt says he forgot he was supposed to take it twice daily and has just continued taking it once daily  He denies side effects from metformin  Advised pt to go back to taking metformin twice daily and we will reassess his BG at next visit . Pt administers Trulicity on Sundays . Sometimes pt checks BG additional times during the day . Denies sweating, dizziness, etc . Has had hypoglycemia once or twice (rare), eats a piece of candy or drink juice when that happens . Pt receives Lobbyist through patient assistance Diet extensively Has cut back on sodas May go on a sweets binge every now and then Fish, slaw, hush puppies Roast, greens, potatoes,  bread Recommend limiting carbohydrates Drinks a lot of water each day Recommend pt drink 64 ounces of water daily Exercise extensively Walks daily (3-4 miles each day), says he gets his heart rate up Cuts grass and works outside Occasionally uses weights Recommend pt get 30 minutes of moderate intensity exercise daily 5 times a week (150 minutes total per week)  Plan Continue current medications  Start taking metformin twice daily as directed   Hyperlipidemia   LDL goal < 70  Lipid Panel     Component Value Date/Time   CHOL 168 11/30/2019 1040   TRIG 70 11/30/2019 1040   HDL 51 11/30/2019 1040   LDLCALC 104 (H) 11/30/2019 1040    Hepatic Function Latest Ref Rng & Units 11/30/2019 05/30/2019 11/24/2018  Total Protein 6.0 - 8.5 g/dL 7.8 7.8 8.0  Albumin 3.8 - 4.8 g/dL 4.5 4.4 4.8  AST 0 - 40 IU/L 22 28 30   ALT 0 - 44 IU/L 11 20 25   Alk Phosphatase 48 - 121 IU/L 116 111 117  Total Bilirubin 0.0 - 1.2 mg/dL 0.5 0.3 0.5     The 10-year ASCVD risk score Mikey Bussing DC Jr., et al., 2013) is: 33.3%   Values used to calculate the score:     Age: 34 years     Sex: Male     Is Non-Hispanic African American: Yes     Diabetic: Yes     Tobacco smoker: No     Systolic Blood Pressure: 544 mmHg     Is BP treated: Yes     HDL Cholesterol: 51 mg/dL     Total Cholesterol: 168 mg/dL   Patient has failed these meds in past: N/A Patient is currently uncontrolled on the following medications:  . Amlodipine- atorvastatin 10-20mg daily  We discussed:    LDL is above goal of 70  Diet and exercise can help to decrease LDL . Increasing dose of amlodipine-atorvastatin was discussed with patient at last office visit, but has not been done yet o Pt agreeable to increasing dose . Denies side effects from medications  Plan Continue current medications  Collaborate with PCP about increasing amlodipine-atorvastatin to 10-82m as discussed at last office visit  Hypertension   BP goal is:   <130/80  Office blood pressures are  BP Readings from Last 3 Encounters:  11/30/19 (!) 146/88  09/07/19 132/80  05/30/19 136/88   Patient checks BP at home once a month Patient home BP readings are ranging: 130/80  Patient has failed these meds in the past: N/A Patient is currently controlled on the following medications:  . Enalapril 142mdaily   Amlodipine- atorvastatin 10-20mg daily  We discussed: . Denies headaches Diet and  exercise extensively o Pt admits to using some salt on his foods o Recommend alternative seasonings to salt (garlic, pepper, etc) o Discussed why salt increases blood pressure  Plan Continue current medications   Erectile Dysfunction   Patient has failed these meds in past: N/A Patient is currently controlled on the following medications:   Sildenafil 118m daily as needed  We discussed:   Pt takes as needed  Plan Continue current medications  Health Maintenance   Patient is currently on the following medications:  . Cyanocobalamin injection 10073m once monthly  We discussed:    Pt administers B12 at the beginning of each month  No other vitamins or supplements  Plan Continue current medications  Recommend B12 level at next office visit   Vaccines   Reviewed and discussed patient's vaccination history.  [facility] records reviewed.  Immunization History  Administered Date(s) Administered  . Influenza Split 06/12/2011  . Influenza, High Dose Seasonal PF 03/22/2019  . Influenza,inj,Quad PF,6+ Mos 05/05/2018  . Influenza-Unspecified 03/22/2019  . Pneumococcal Polysaccharide-23 04/25/2019   We discussed:  Tdap- pt thinks it has been about 5 years since his last shot (No record on [facility])  Shingrix-pt has heard about it but never received it  Discussed that it is a two dose series and discussed efficacy  Plan Recommended patient receive Shingrix vaccine series in pharmacy.  Collaborate with PCP staff to determine if Tdap has  been administered in office before  Medication Management   [facility] Uses pill box? No - Doesn't feel like he needs Pt endorses 99% compliance  We discussed:  . Importance of taking each medications daily as directed . Medication synchronization, adherence packaging, and delivery available through [facility] pharmacy (pt will consider)  Plan Continue current medication management strategy  Follow up: 2 month phone visit  CoJannette FogoPharmD Clinical Pharmacist [facility] [phone]

## 2020-01-30 NOTE — Patient Instructions (Addendum)
Visit Information  Goals Addressed            This Visit's Progress   . Pharmacy Care Plan       CARE PLAN ENTRY (see longitudinal plan of care for additional care plan information)  Current Barriers:  . Chronic Disease Management support, education, and care coordination needs related to Hypertension, Hyperlipidemia, and Diabetes   Hypertension BP Readings from Last 3 Encounters:  11/30/19 (!) 146/88  09/07/19 132/80  05/30/19 136/88   . Pharmacist Clinical Goal(s): o Over the next 180 days, patient will work with PharmD and providers to maintain BP goal <130/80 . Current regimen:  . Enalapril 10mg  daily   Amlodipine- atorvastatin 10-20mg  daily . Interventions: o Provided dietary and exercise recommendations . Patient self care activities - Over the next 180 days, patient will: o Check BP once monthly, document, and provide at future appointments o Ensure daily salt intake < 2300 mg/day o Use alternative seasonings besides salt (garlic, pepper, etc) o Exercise at least 30 minutes daily 5 times per week (150 minutes per week)  Hyperlipidemia Lab Results  Component Value Date/Time   LDLCALC 104 (H) 11/30/2019 10:40 AM   . Pharmacist Clinical Goal(s): o Over the next 90 days, patient will work with PharmD and providers to achieve LDL goal < 70 . Current regimen:  o Amlodipine- atorvastatin 10-20mg  daily . Interventions: o Provided dietary and exercise recommendations o Collaborate with PCP to send in new prescription for amlodipine-atorvastatin 10-40mg  as discussed at last office visit . Patient self care activities - Over the next 90 days, patient will: o Exercise at least 30 minutes daily 5 times per week (150 minutes per week) o Begin taking increased dose of amlodipine-atorvastatin when sent in by PCP  Diabetes Lab Results  Component Value Date/Time   HGBA1C 6.7 (H) 11/30/2019 10:40 AM   HGBA1C 6.9 (H) 09/07/2019 10:51 AM   . Pharmacist Clinical  Goal(s): o Over the next 180 days, patient will work with PharmD and providers to maintain A1c goal <7% . Current regimen:  . Trulicity 1.5mg  weekly . [name] 22 units at bedtime . Metformin 1000mg  twice daily with a meal . Interventions: o Provided dietary and exercise recommendations o Patient was only taking metformin once daily. Advised patient to resume twice daily dosing as directed o Discussed patient assistance for Trulicity and Engineer, agricultural . Patient self care activities - Over the next 180 days, patient will: o Check blood sugar once daily, document, and provide at future appointments o Contact provider with any episodes of hypoglycemia o Exercise at least 30 minutes daily 5 times per week (150 minutes per week) o Start taking metformin twice daly with meals  Medication management . Pharmacist Clinical Goal(s): o Over the next 180 days, patient will work with PharmD and providers to maintain optimal medication adherence . [facility] . Interventions o Comprehensive medication review performed. o Continue current medication management strategy . Patient self care activities - Over the next 180 days, patient will: o Focus on medication adherence by considering adherence packaging and medication synchronization o Take medications as prescribed o Report any questions or concerns to PharmD and/or provider(s)  Initial goal documentation        Mr. Hallums was given information about Chronic Care Management services today including:  1. CCM service includes personalized support from designated clinical staff supervised by his physician, including individualized plan of care and coordination with other care providers 2. 24/7 contact phone numbers for assistance for  urgent and routine care needs. 3. Standard insurance, coinsurance, copays and deductibles apply for chronic care management only during months in which we provide at least 20 minutes of these services. Most  insurances cover these services at 100%, however patients may be responsible for any copay, coinsurance and/or deductible if applicable. This service may help you avoid the need for more expensive face-to-face services. 4. Only one practitioner may furnish and bill the service in a calendar month. 5. The patient may stop CCM services at any time (effective at the end of the month) by phone call to the office staff.  Patient agreed to services and verbal consent obtained.   The patient verbalized understanding of instructions provided today and agreed to receive a mailed copy of patient instruction and/or educational materials. Telephone follow up appointment with pharmacy team member scheduled for: 04/13/20 @ 1:00 PM  Jannette Fogo, PharmD Clinical Pharmacist [facility] [phone]   Diabetes Mellitus and Nutrition, Adult When you have diabetes (diabetes mellitus), it is very important to have healthy eating habits because your blood sugar (glucose) levels are greatly affected by what you eat and drink. Eating healthy foods in the appropriate amounts, at about the same times every day, can help you:  Control your blood glucose.  Lower your risk of heart disease.  Improve your blood pressure.  Reach or maintain a healthy weight. Every person with diabetes is different, and each person has different needs for a meal plan. Your health care provider may recommend that you work with a diet and nutrition specialist (dietitian) to make a meal plan that is best for you. Your meal plan may vary depending on factors such as:  The calories you need.  The medicines you take.  Your weight.  Your blood glucose, blood pressure, and cholesterol levels.  Your activity level.  Other health conditions you have, such as heart or kidney disease. How do carbohydrates affect me? Carbohydrates, also called carbs, affect your blood glucose level more than any other type of  food. Eating carbs naturally raises the amount of glucose in your blood. Carb counting is a method for keeping track of how many carbs you eat. Counting carbs is important to keep your blood glucose at a healthy level, especially if you use insulin or take certain oral diabetes medicines. It is important to know how many carbs you can safely have in each meal. This is different for every person. Your dietitian can help you calculate how many carbs you should have at each meal and for each snack. Foods that contain carbs include:  Bread, cereal, rice, pasta, and crackers.  Potatoes and corn.  Peas, beans, and lentils.  Milk and yogurt.  Fruit and juice.  Desserts, such as cakes, cookies, ice cream, and candy. How does alcohol affect me? Alcohol can cause a sudden decrease in blood glucose (hypoglycemia), especially if you use insulin or take certain oral diabetes medicines. Hypoglycemia can be a life-threatening condition. Symptoms of hypoglycemia (sleepiness, dizziness, and confusion) are similar to symptoms of having too much alcohol. If your health care provider says that alcohol is safe for you, follow these guidelines:  Limit alcohol intake to no more than 1 drink per day for nonpregnant women and 2 drinks per day for men. One drink equals 12 oz of beer, 5 oz of wine, or 1 oz of hard liquor.  Do not drink on an empty stomach.  Keep yourself hydrated with water, diet soda, or unsweetened iced tea.  Keep in mind that regular soda, juice, and other mixers may contain a lot of sugar and must be counted as carbs. What are tips for following this plan?  Reading food labels  Start by checking the serving size on the "Nutrition Facts" label of packaged foods and drinks. The amount of calories, carbs, fats, and other nutrients listed on the label is based on one serving of the item. Many items contain more than one serving per package.  Check the total grams (g) of carbs in one serving.  You can calculate the number of servings of carbs in one serving by dividing the total carbs by 15. For example, if a food has 30 g of total carbs, it would be equal to 2 servings of carbs.  Check the number of grams (g) of saturated and trans fats in one serving. Choose foods that have low or no amount of these fats.  Check the number of milligrams (mg) of salt (sodium) in one serving. Most people should limit total sodium intake to less than 2,300 mg per day.  Always check the nutrition information of foods labeled as "low-fat" or "nonfat". These foods may be higher in added sugar or refined carbs and should be avoided.  Talk to your dietitian to identify your daily goals for nutrients listed on the label. Shopping  Avoid buying canned, premade, or processed foods. These foods tend to be high in fat, sodium, and added sugar.  Shop around the outside edge of the grocery store. This includes fresh fruits and vegetables, bulk grains, fresh meats, and fresh dairy. Cooking  Use low-heat cooking methods, such as baking, instead of high-heat cooking methods like deep frying.  Cook using healthy oils, such as olive, canola, or sunflower oil.  Avoid cooking with butter, cream, or high-fat meats. Meal planning  Eat meals and snacks regularly, preferably at the same times every day. Avoid going long periods of time without eating.  Eat foods high in fiber, such as fresh fruits, vegetables, beans, and whole grains. Talk to your dietitian about how many servings of carbs you can eat at each meal.  Eat 4-6 ounces (oz) of lean protein each day, such as lean meat, chicken, fish, eggs, or tofu. One oz of lean protein is equal to: ? 1 oz of meat, chicken, or fish. ? 1 egg. ?  cup of tofu.  Eat some foods each day that contain healthy fats, such as avocado, nuts, seeds, and fish. Lifestyle  Check your blood glucose regularly.  Exercise regularly as told by your health care provider. This may  include: ? 150 minutes of moderate-intensity or vigorous-intensity exercise each week. This could be brisk walking, biking, or water aerobics. ? Stretching and doing strength exercises, such as yoga or weightlifting, at least 2 times a week.  Take medicines as told by your health care provider.  Do not use any products that contain nicotine or tobacco, such as cigarettes and e-cigarettes. If you need help quitting, ask your health care provider.  Work with a Social worker or diabetes educator to identify strategies to manage stress and any emotional and social challenges. Questions to ask a health care provider  Do I need to meet with a diabetes educator?  Do I need to meet with a dietitian?  What number can I call if I have questions?  When are the best times to check my blood glucose? Where to find more information:  American Diabetes Association: diabetes.org  Academy of Nutrition and Dietetics:  www.eatright.[employer] of Diabetes and Digestive and Kidney Diseases (NIH): [employer] Summary  A healthy meal plan will help you control your blood glucose and maintain a healthy lifestyle.  Working with a diet and nutrition specialist (dietitian) can help you make a meal plan that is best for you.  Keep in mind that carbohydrates (carbs) and alcohol have immediate effects on your blood glucose levels. It is important to count carbs and to use alcohol carefully. This information is not intended to replace advice given to you by your health care provider. Make sure you discuss any questions you have with your health care provider. Document Revised: 05/22/2017 Document Reviewed: 07/14/2016 Elsevier Patient Education  2020 [employer].

## 2020-01-31 ENCOUNTER — Other Ambulatory Visit: Payer: Self-pay

## 2020-01-31 MED ORDER — AMLODIPINE-ATORVASTATIN 10-40 MG PO TABS
1.0000 | ORAL_TABLET | Freq: Every day | ORAL | 1 refills | Status: DC
Start: 2020-01-31 — End: 2020-07-25

## 2020-03-05 ENCOUNTER — Telehealth: Payer: Self-pay

## 2020-03-05 ENCOUNTER — Telehealth: Payer: Medicare Other

## 2020-03-05 NOTE — Telephone Encounter (Cosign Needed)
  Chronic Care Management   Outreach Note  03/05/2020 Name: William Mcguire MRN: [id number] DOB: 1956-02-16  Referred by: [name], MD Reason for referral : Chronic Care Management (FU RN CM Call )   An unsuccessful telephone outreach was attempted today. The patient was referred to the case management team for assistance with care management and care coordination.   Follow Up Plan: A HIPAA compliant phone message was left for the patient providing contact information and requesting a return call.  Telephone follow up appointment with care management team member scheduled for: 04/09/20  Barb Merino, RN, BSN, CCM Care Management Coordinator [facility]  Direct Phone: [phone]

## 2020-03-07 ENCOUNTER — Telehealth: Payer: Self-pay

## 2020-03-07 NOTE — Chronic Care Management (AMB) (Signed)
Chronic Care Management Pharmacy Assistant   Name: William Mcguire  MRN: [id number] DOB: [birth year]  Reason for Encounter: Disease State/ Diabetes / Hypertension   Patient Questions:  1.  Have you seen any other providers since your last visit? Yes, 03/05/2020,Angel Little, RN Chronic Care Visit  2.  Any changes in your medicines or health? No   PCP : [name], MD  Allergies:  No Known Allergies  Medications: Outpatient Encounter Medications as of 03/07/2020  Medication Sig  . amLODipine-atorvastatin (CADUET) 10-40 MG tablet Take 1 tablet by mouth daily.  [name] aspirin EC 81 MG tablet Take 81 mg by mouth daily.    . cyanocobalamin (,VITAMIN B-12,) 1000 MCG/ML injection INJECT 1 ML INTRAMUSCULARLY WEEKLY FOR 3 WEEKS, THEN USE ONCE MONTHLY (Patient taking differently: 1,000 mcg every 30 (thirty) days. )  . Dulaglutide (TRULICITY) 1.5 FO/2.7XA SOPN Inject 1.5 mg into the skin once a week.   . enalapril (VASOTEC) 10 MG tablet Take 1 tablet (10 mg total) by mouth daily.  [name] glucose blood test strip Use as instructed to test blood sugar 3 times a day. Dx code: e11.65  . Insulin Glargine ([name] KWIKPEN) 100 UNIT/ML Inject 0.22 mLs (22 Units total) into the skin at bedtime.  . Insulin Pen Needle (B-D ULTRAFINE III SHORT PEN) 31G X 8 MM MISC USE AS DIRECTED  . metFORMIN (GLUCOPHAGE) 1000 MG tablet TAKE 1 TABLET BY MOUTH TWICE A DAY WITH A MEAL (Patient taking differently: Take 1,000 mg by mouth daily with breakfast. TAKE 1 TABLET BY MOUTH TWICE A DAY WITH A MEAL)  . sildenafil (VIAGRA) 100 MG tablet TAKE 1 TABLET BY MOUTH EVERY DAY AS NEEDED  . SYRINGE-NEEDLE, DISP, 3 ML (B-D 3CC LUER-LOK SYR 25GX5/8") 25G X 5/8" 3 ML MISC USE AS DIRECTED   No facility-administered encounter medications on file as of 03/07/2020.    Current Diagnosis: Patient Active Problem List   Diagnosis Date Noted  . Hypertensive nephropathy 08/05/2018  . Class 1 obesity due to excess calories with serious  comorbidity and body mass index (BMI) of 32.0 to 32.9 in adult 08/05/2018  . Pyelonephritis 06/23/2011  . AKI (acute kidney injury) ([place]) 06/23/2011  . Diabetes mellitus 06/23/2011  . HTN (hypertension) 06/23/2011   Recent Relevant Labs: Lab Results  Component Value Date/Time   HGBA1C 6.7 (H) 11/30/2019 10:40 AM   HGBA1C 6.9 (H) 09/07/2019 10:51 AM   MICROALBUR 30 11/30/2019 09:55 AM   MICROALBUR 80 03/17/2019 09:39 AM    Kidney Function Lab Results  Component Value Date/Time   CREATININE 1.22 11/30/2019 10:40 AM   CREATININE 1.05 09/07/2019 10:51 AM   GFRNONAA 62 11/30/2019 10:40 AM   GFRAA 71 11/30/2019 10:40 AM    . Current antihyperglycemic regimen:  Trulicity 1.5 mg, [name] Kwikpen .22 mLs, Metformin 1000mg  . What recent interventions/DTPs have been made to improve glycemic control: Patient is continuing to follow healthy diet,and is taking medications as directed.  . Have there been any recent hospitalizations or ED visits since last visit with CPP? No   . Patient denies hypoglycemic symptoms, including Pale, Sweaty, Shaky, Hungry, Nervous/irritable and Vision changes . Patient denies hyperglycemic symptoms, including blurry vision, excessive thirst, fatigue, polyuria and weakness . How often are you checking your blood sugar? once daily . What are your blood sugars ranging?  o Fasting: 03/10/2020  - 98 o 03/11/2020 - 107 o 03/12/2020 120 o Before meals: none o After meals: none o Bedtime: **none . During the week,  how often does your blood glucose drop below 70? Never . Are you checking your feet daily/regularly? Yes. Patient denies any sores, tingling or pain.  Adherence Review: Is the patient currently on a STATIN medication? Yes Caduet 10/40 mg Is the patient currently on ACE/ARB medication? Yes Enalapril 10 mg Does the patient have >5 day gap between last estimated fill dates? No   Reviewed chart prior to disease state call. Spoke with patient regarding  BP  Recent Office Vitals: BP Readings from Last 3 Encounters:  11/30/19 (!) 146/88  09/07/19 132/80  05/30/19 136/88   Pulse Readings from Last 3 Encounters:  11/30/19 68  09/07/19 67  05/30/19 71    Wt Readings from Last 3 Encounters:  11/30/19 248 lb 3.2 oz (112.6 kg)  09/07/19 258 lb 3.2 oz (117.1 kg)  05/30/19 258 lb 6.4 oz (117.2 kg)     Kidney Function Lab Results  Component Value Date/Time   CREATININE 1.22 11/30/2019 10:40 AM   CREATININE 1.05 09/07/2019 10:51 AM   GFRNONAA 62 11/30/2019 10:40 AM   GFRAA 71 11/30/2019 10:40 AM    BMP Latest Ref Rng & Units 11/30/2019 09/07/2019 05/30/2019  Glucose 65 - 99 mg/dL 75 110(H) 148(H)  BUN 8 - 27 mg/dL 11 13 13   Creatinine 0.76 - 1.27 mg/dL 1.22 1.05 1.33(H)  BUN/Creat Ratio 10 - 24 9(L) 12 10  Sodium 134 - 144 mmol/L 138 140 138  Potassium 3.5 - 5.2 mmol/L 4.1 4.5 4.2  Chloride 96 - 106 mmol/L 101 103 102  CO2 20 - 29 mmol/L 24 23 24   Calcium 8.6 - 10.2 mg/dL 9.9 10.2 9.7    . Current antihypertensive regimen:  Caduet 10-40 mg one tablet a day,   Enalapril 10 mg one tablet a day. . How often are you checking your Blood Pressure?  Patient has not check blood pressure at home this month. . Current home BP readings: none  . What recent interventions/DTPs have been made by any provider to improve Blood Pressure control since last CPP Visit: Patient is taking medications as directed and eating healthy and walking and mowing yard.  . Any recent hospitalizations or ED visits since last visit with CPP? No . What diet changes have been made to improve Blood Pressure Control?  o Patient states that he has cut down on his servings, and eating healthier. . What exercise is being done to improve your Blood Pressure Control?  o Patient states he has been walking and keeps up with yard work at home.  Adherence Review: Is the patient currently on ACE/ARB medication? Yes Enalapril 10 mg  Does the patient have >5 day gap between  last estimated fill dates? No    Follow-Up:  Pharmacist Review - Patient states he is doing okay. Patient does have blood pressure monitor at home, he has not checked blood pressure this month. Patient has been taking all medications as directed.

## 2020-03-23 ENCOUNTER — Other Ambulatory Visit: Payer: Self-pay | Admitting: Internal Medicine

## 2020-04-03 ENCOUNTER — Other Ambulatory Visit: Payer: Self-pay

## 2020-04-03 ENCOUNTER — Ambulatory Visit (INDEPENDENT_AMBULATORY_CARE_PROVIDER_SITE_OTHER): Payer: Medicare Other | Admitting: Internal Medicine

## 2020-04-03 ENCOUNTER — Telehealth: Payer: Self-pay | Admitting: Pharmacist

## 2020-04-03 ENCOUNTER — Encounter: Payer: Self-pay | Admitting: Internal Medicine

## 2020-04-03 VITALS — BP 144/82 | HR 62 | Temp 98.0°F | Ht 72.2 in | Wt 250.0 lb

## 2020-04-03 DIAGNOSIS — N182 Chronic kidney disease, stage 2 (mild): Secondary | ICD-10-CM

## 2020-04-03 DIAGNOSIS — E1122 Type 2 diabetes mellitus with diabetic chronic kidney disease: Secondary | ICD-10-CM | POA: Diagnosis not present

## 2020-04-03 DIAGNOSIS — E6609 Other obesity due to excess calories: Secondary | ICD-10-CM

## 2020-04-03 DIAGNOSIS — Z6833 Body mass index (BMI) 33.0-33.9, adult: Secondary | ICD-10-CM

## 2020-04-03 DIAGNOSIS — Z23 Encounter for immunization: Secondary | ICD-10-CM | POA: Diagnosis not present

## 2020-04-03 DIAGNOSIS — D519 Vitamin B12 deficiency anemia, unspecified: Secondary | ICD-10-CM | POA: Diagnosis not present

## 2020-04-03 MED ORDER — ENALAPRIL MALEATE 10 MG PO TABS: 10.0000 mg | ORAL_TABLET | Freq: Every day | ORAL | 2 refills | Status: DC

## 2020-04-03 NOTE — Chronic Care Management (AMB) (Signed)
    Chronic Care Management Pharmacy Assistant   Name: William Mcguire  MRN: [id number] DOB: 10-Apr-1956  Reason for Encounter: Medication Review - Patient Assistance Coordination   Patient Questions:   PCP : William Chard, MD  Allergies:  No Known Allergies  Medications: Outpatient Encounter Medications as of 04/03/2020  Medication Sig  . amLODipine-atorvastatin (CADUET) 10-40 MG tablet Take 1 tablet by mouth daily.  [name] aspirin EC 81 MG tablet Take 81 mg by mouth daily.    . cyanocobalamin (,VITAMIN B-12,) 1000 MCG/ML injection INJECT 1 ML INTRAMUSCULARLY WEEKLY FOR 3 WEEKS, THEN USE ONCE MONTHLY (Patient taking differently: 1,000 mcg every 30 (thirty) days. )  . Dulaglutide (TRULICITY) 1.5 BT/6.6MA SOPN Inject 1.5 mg into the skin once a week.   . enalapril (VASOTEC) 10 MG tablet Take 1 tablet (10 mg total) by mouth daily.  [name] glucose blood test strip Use as instructed to test blood sugar 3 times a day. Dx code: e11.65  . Insulin Glargine ([name] KWIKPEN) 100 UNIT/ML Inject 0.22 mLs (22 Units total) into the skin at bedtime.  . Insulin Pen Needle (B-D ULTRAFINE III SHORT PEN) 31G X 8 MM MISC USE AS DIRECTED  . metFORMIN (GLUCOPHAGE) 1000 MG tablet TAKE 1 TABLET BY MOUTH TWICE A DAY WITH A MEAL  . sildenafil (VIAGRA) 100 MG tablet TAKE 1 TABLET BY MOUTH EVERY DAY AS NEEDED  . SYRINGE-NEEDLE, DISP, 3 ML (B-D 3CC LUER-LOK SYR 25GX5/8") 25G X 5/8" 3 ML MISC USE AS DIRECTED   No facility-administered encounter medications on file as of 04/03/2020.    Current Diagnosis: Patient Active Problem List   Diagnosis Date Noted  . Hypertensive nephropathy 08/05/2018  . Class 1 obesity due to excess calories with serious comorbidity and body mass index (BMI) of 32.0 to 32.9 in adult 08/05/2018  . Pyelonephritis 06/23/2011  . AKI (acute kidney injury) ([place]) 06/23/2011  . Diabetes mellitus 06/23/2011  . HTN (hypertension) 06/23/2011      Follow-Up:  Patient William Mcguire spoke to William Mcguire about patient's Trulicity and Engineer, agricultural , William Mcguire states that patient was not on automatic deliveries, so someone would need to call to have them processed, states that since I called today she would have medication processed today and will be shipped out in 3-4 days from today, at patient's address.  Patient needs to do new application before 00/45/9977 and if provider wanted patient on automatic deliveries she would need to put enough refills for the year.  William Mcguire, CPP . Notified   William Mcguire, [place] Pharmacist Assistant [phone]

## 2020-04-03 NOTE — Patient Instructions (Signed)
Diabetes Mellitus and Foot Care Foot care is an important part of your health, especially when you have diabetes. Diabetes may cause you to have problems because of poor blood flow (circulation) to your feet and legs, which can cause your skin to:  Become thinner and drier.  Break more easily.  Heal more slowly.  Peel and crack. You may also have nerve damage (neuropathy) in your legs and feet, causing decreased feeling in them. This means that you may not notice minor injuries to your feet that could lead to more serious problems. Noticing and addressing any potential problems early is the best way to prevent future foot problems. How to care for your feet Foot hygiene  Wash your feet daily with warm water and mild soap. Do not use hot water. Then, pat your feet and the areas between your toes until they are completely dry. Do not soak your feet as this can dry your skin.  Trim your toenails straight across. Do not dig under them or around the cuticle. File the edges of your nails with an emery board or nail file.  Apply a moisturizing lotion or petroleum jelly to the skin on your feet and to dry, brittle toenails. Use lotion that does not contain alcohol and is unscented. Do not apply lotion between your toes. Shoes and socks  Wear clean socks or stockings every day. Make sure they are not too tight. Do not wear knee-high stockings since they may decrease blood flow to your legs.  Wear shoes that fit properly and have enough cushioning. Always look in your shoes before you put them on to be sure there are no objects inside.  To break in new shoes, wear them for just a few hours a day. This prevents injuries on your feet. Wounds, scrapes, corns, and calluses  Check your feet daily for blisters, cuts, bruises, sores, and redness. If you cannot see the bottom of your feet, use a mirror or ask someone for help.  Do not cut corns or calluses or try to remove them with medicine.  If you  find a minor scrape, cut, or break in the skin on your feet, keep it and the skin around it clean and dry. You may clean these areas with mild soap and water. Do not clean the area with peroxide, alcohol, or iodine.  If you have a wound, scrape, corn, or callus on your foot, look at it several times a day to make sure it is healing and not infected. Check for: ? Redness, swelling, or pain. ? Fluid or blood. ? Warmth. ? Pus or a bad smell. General instructions  Do not cross your legs. This may decrease blood flow to your feet.  Do not use heating pads or hot water bottles on your feet. They may burn your skin. If you have lost feeling in your feet or legs, you may not know this is happening until it is too late.  Protect your feet from hot and cold by wearing shoes, such as at the beach or on hot pavement.  Schedule a complete foot exam at least once a year (annually) or more often if you have foot problems. If you have foot problems, report any cuts, sores, or bruises to your health care provider immediately. Contact a health care provider if:  You have a medical condition that increases your risk of infection and you have any cuts, sores, or bruises on your feet.  You have an injury that is not   healing.  You have redness on your legs or feet.  You feel burning or tingling in your legs or feet.  You have pain or cramps in your legs and feet.  Your legs or feet are numb.  Your feet always feel cold.  You have pain around a toenail. Get help right away if:  You have a wound, scrape, corn, or callus on your foot and: ? You have pain, swelling, or redness that gets worse. ? You have fluid or blood coming from the wound, scrape, corn, or callus. ? Your wound, scrape, corn, or callus feels warm to the touch. ? You have pus or a bad smell coming from the wound, scrape, corn, or callus. ? You have a fever. ? You have a red line going up your leg. Summary  Check your feet every day  for cuts, sores, red spots, swelling, and blisters.  Moisturize feet and legs daily.  Wear shoes that fit properly and have enough cushioning.  If you have foot problems, report any cuts, sores, or bruises to your health care provider immediately.  Schedule a complete foot exam at least once a year (annually) or more often if you have foot problems. This information is not intended to replace advice given to you by your health care provider. Make sure you discuss any questions you have with your health care provider. Document Revised: 03/02/2019 Document Reviewed: 07/11/2016 Elsevier Patient Education  2020 Elsevier Inc.  

## 2020-04-03 NOTE — Progress Notes (Signed)
I,[name],acting as a Education administrator for [name], MD.,have documented all relevant documentation on the behalf of [name], MD,as directed by  [name], MD while in the presence of [name], MD.  This visit occurred during the SARS-CoV-2 public health emergency.  Safety protocols were in place, including screening questions prior to the visit, additional usage of staff PPE, and extensive cleaning of exam room while observing appropriate contact time as indicated for disinfecting solutions.  Subjective:     Patient ID: William Mcguire , male    DOB: 01/24/1956 , 64 y.o.   MRN: [id number]   Chief Complaint  Patient presents with  . Diabetes  . Hypertension    HPI  He is here today for diabetes and BP check. He reports compliance with meds.   Diabetes He presents for his follow-up diabetic visit. He has type 2 diabetes mellitus. His disease course has been stable. There are no hypoglycemic associated symptoms. Pertinent negatives for diabetes include no blurred vision and no chest pain. There are no hypoglycemic complications. Diabetic complications include nephropathy. Risk factors for coronary artery disease include diabetes mellitus, dyslipidemia, hypertension, male sex and sedentary lifestyle. His breakfast blood glucose is taken between 8-9 am. His breakfast blood glucose range is generally 90-110 mg/dl.  Hypertension This is a chronic problem. The current episode started more than 1 year ago. The problem has been gradually improving since onset. The problem is controlled. Pertinent negatives include no blurred vision, chest pain, palpitations or shortness of breath.     Past Medical History:  Diagnosis Date  . Diabetes mellitus   . High cholesterol   . Hypertension      Family History  Problem Relation Age of Onset  . Hypertension Mother   . Multiple myeloma Mother   . Hypertension Father   . Diabetes Father      Current Outpatient  Medications:  .  amLODipine-atorvastatin (CADUET) 10-40 MG tablet, Take 1 tablet by mouth daily., Disp: 90 tablet, Rfl: 1 .  aspirin EC 81 MG tablet, Take 81 mg by mouth daily.  , Disp: , Rfl:  .  cyanocobalamin (,VITAMIN B-12,) 1000 MCG/ML injection, INJECT 1 ML INTRAMUSCULARLY WEEKLY FOR 3 WEEKS, THEN USE ONCE MONTHLY (Patient taking differently: 1,000 mcg every 30 (thirty) days. ), Disp: 9 mL, Rfl: 3 .  Dulaglutide (TRULICITY) 1.5 IP/3.8SN SOPN, Inject 1.5 mg into the skin once a week. , Disp: , Rfl:  .  glucose blood test strip, Use as instructed to test blood sugar 3 times a day. Dx code: e11.65, Disp: 100 each, Rfl: 12 .  Insulin Glargine ([name] KWIKPEN) 100 UNIT/ML, Inject 0.22 mLs (22 Units total) into the skin at bedtime., Disp: 45 mL, Rfl: 3 .  Insulin Pen Needle (B-D ULTRAFINE III SHORT PEN) 31G X 8 MM MISC, USE AS DIRECTED, Disp: 100 each, Rfl: PRN .  metFORMIN (GLUCOPHAGE) 1000 MG tablet, TAKE 1 TABLET BY MOUTH TWICE A DAY WITH A MEAL, Disp: 180 tablet, Rfl: 0 .  sildenafil (VIAGRA) 100 MG tablet, TAKE 1 TABLET BY MOUTH EVERY DAY AS NEEDED, Disp: 10 tablet, Rfl: 5 .  SYRINGE-NEEDLE, DISP, 3 ML (B-D 3CC LUER-LOK SYR 25GX5/8") 25G X 5/8" 3 ML MISC, USE AS DIRECTED, Disp: 12 each, Rfl: 24 .  enalapril (VASOTEC) 10 MG tablet, Take 1 tablet (10 mg total) by mouth daily., Disp: 90 tablet, Rfl: 2   No Known Allergies   Review of Systems  Constitutional: Negative.   Eyes:  Negative for blurred vision.  Respiratory: Negative.  Negative for shortness of breath.   Cardiovascular: Negative.  Negative for chest pain and palpitations.  Gastrointestinal: Negative.   Psychiatric/Behavioral: Negative.   All other systems reviewed and are negative.    Today's Vitals   04/03/20 1007  BP: (!) 144/82  Pulse: 62  Temp: 98 F (36.7 C)  TempSrc: Oral  Weight: 250 lb (113.4 kg)  Height: 6' 0.2" (1.834 m)  PainSc: 0-No pain   Body mass index is 33.72 kg/m.   Objective:  Physical  Exam Vitals and nursing note reviewed.  Constitutional:      Appearance: Normal appearance. He is obese.  HENT:     Head: Normocephalic and atraumatic.  Cardiovascular:     Rate and Rhythm: Normal rate and regular rhythm.     Heart sounds: Normal heart sounds.  Pulmonary:     Breath sounds: Normal breath sounds.  Skin:    General: Skin is warm.  Neurological:     General: No focal deficit present.     Mental Status: He is alert and oriented to person, place, and time.         Assessment And Plan:     1. Type 2 diabetes mellitus with stage 2 chronic kidney disease, without long-term current use of insulin (HCC) Comments: Chronic, I will check labs as listed below. He will continue with current meds for now.  - Hemoglobin A1c - BMP8+EGFR  2. Anemia due to vitamin B12 deficiency, unspecified B12 deficiency type Comments: He plans to have his wife administer B12 today.  He will continue with monthly injections.   3. Need for vaccination Comments: He was given high dose flu vaccine to update immunization history.  - Flu Vaccine QUAD High Dose(Fluad)  4. Class 1 obesity due to excess calories with serious comorbidity and body mass index (BMI) of 33.0 to 33.9 in adult Comments: He is encouraged to strive for BMI less than to decrease cardiac risk. Advised to aim for at least 150 minutes of exercise per week.      Patient was given opportunity to ask questions. Patient verbalized understanding of the plan and was able to repeat key elements of the plan. All questions were answered to their satisfaction.  [name], MD   I, [name], MD, have reviewed all documentation for this visit. The documentation on 04/03/20 for the exam, diagnosis, procedures, and orders are all accurate and complete.  THE PATIENT IS ENCOURAGED TO PRACTICE SOCIAL DISTANCING DUE TO THE COVID-19 PANDEMIC.

## 2020-04-04 LAB — BMP8+EGFR
BUN/Creatinine Ratio: 12 (ref 10–24)
BUN: 15 mg/dL (ref 8–27)
CO2: 26 mmol/L (ref 20–29)
Calcium: 9.8 mg/dL (ref 8.6–10.2)
Chloride: 102 mmol/L (ref 96–106)
Creatinine, Ser: 1.29 mg/dL — ABNORMAL HIGH (ref 0.76–1.27)
GFR calc Af Amer: 66 mL/min/{1.73_m2} (ref >59)
GFR calc non Af Amer: 57 mL/min/{1.73_m2} — ABNORMAL LOW (ref >59)
Glucose: 78 mg/dL (ref 65–99)
Potassium: 4.6 mmol/L (ref 3.5–5.2)
Sodium: 139 mmol/L (ref 134–144)

## 2020-04-04 LAB — HEMOGLOBIN A1C
Est. average glucose Bld gHb Est-mCnc: 137 mg/dL
Hgb A1c MFr Bld: 6.4 % — ABNORMAL HIGH (ref 4.8–5.6)

## 2020-04-09 ENCOUNTER — Telehealth: Payer: Self-pay

## 2020-04-09 ENCOUNTER — Telehealth: Payer: Medicare Other

## 2020-04-09 NOTE — Telephone Encounter (Cosign Needed)
  Chronic Care Management   Outreach Note  04/09/2020 Name: William Mcguire MRN: [id number] DOB: 06-Apr-1956  Referred by: [name], MD Reason for referral : Chronic Care Management (CCM RNCM FU Call )   An unsuccessful telephone outreach was attempted today. The patient was referred to the case management team for assistance with care management and care coordination.   Follow Up Plan: A HIPAA compliant phone message was left for the patient providing contact information and requesting a return call.  Telephone follow up appointment with care management team member scheduled for: 05/22/20  Barb Merino, RN, BSN, CCM Care Management Coordinator [place] [facility]  Direct Phone: [phone]

## 2020-04-11 ENCOUNTER — Telehealth: Payer: Medicare Other

## 2020-04-11 ENCOUNTER — Other Ambulatory Visit: Payer: Self-pay

## 2020-04-11 ENCOUNTER — Ambulatory Visit: Payer: Self-pay

## 2020-04-11 DIAGNOSIS — I10 Essential (primary) hypertension: Secondary | ICD-10-CM

## 2020-04-11 DIAGNOSIS — E1122 Type 2 diabetes mellitus with diabetic chronic kidney disease: Secondary | ICD-10-CM

## 2020-04-11 DIAGNOSIS — N182 Chronic kidney disease, stage 2 (mild): Secondary | ICD-10-CM

## 2020-04-12 ENCOUNTER — Telehealth: Payer: Medicare Other

## 2020-04-12 ENCOUNTER — Telehealth: Payer: Self-pay

## 2020-04-12 NOTE — Chronic Care Management (AMB) (Signed)
Chronic Care Management   Follow Up Note   04/11/2020 Name: William Mcguire MRN: [id number] DOB: 02-Jul-1955  Referred by: [name], MD Reason for referral : Chronic Care Management (Inbound call from patient)   William Mcguire is a 64 y.o. year old male who is a primary care patient of [name], MD. The CCM team was consulted for assistance with chronic disease management and care coordination needs.    Review of patient status, including review of consultants reports, relevant laboratory and other test results, and collaboration with appropriate care team members and the patient's provider was performed as part of comprehensive patient evaluation and provision of chronic care management services.    SDOH (Social Determinants of Health) assessments performed: Yes - no acute challenges identified at this time See Care Plan activities for detailed interventions related to [name])   Placed outbound CCM RN CM follow up call for a care plan update.     Outpatient Encounter Medications as of 04/11/2020  Medication Sig  . amLODipine-atorvastatin (CADUET) 10-40 MG tablet Take 1 tablet by mouth daily.  [name] aspirin EC 81 MG tablet Take 81 mg by mouth daily.    . cyanocobalamin (,VITAMIN B-12,) 1000 MCG/ML injection INJECT 1 ML INTRAMUSCULARLY WEEKLY FOR 3 WEEKS, THEN USE ONCE MONTHLY (Patient taking differently: 1,000 mcg every 30 (thirty) days. )  . Dulaglutide (TRULICITY) 1.5 HB/7.1IR SOPN Inject 1.5 mg into the skin once a week.   . enalapril (VASOTEC) 10 MG tablet Take 1 tablet (10 mg total) by mouth daily.  [name] glucose blood test strip Use as instructed to test blood sugar 3 times a day. Dx code: e11.65  . Insulin Glargine ([name] KWIKPEN) 100 UNIT/ML Inject 0.22 mLs (22 Units total) into the skin at bedtime.  . Insulin Pen Needle (B-D ULTRAFINE III SHORT PEN) 31G X 8 MM MISC USE AS DIRECTED  . metFORMIN (GLUCOPHAGE) 1000 MG tablet TAKE 1 TABLET BY MOUTH TWICE A DAY WITH A MEAL  .  sildenafil (VIAGRA) 100 MG tablet TAKE 1 TABLET BY MOUTH EVERY DAY AS NEEDED  . SYRINGE-NEEDLE, DISP, 3 ML (B-D 3CC LUER-LOK SYR 25GX5/8") 25G X 5/8" 3 ML MISC USE AS DIRECTED   No facility-administered encounter medications on file as of 04/11/2020.     Objective:  Lab Results  Component Value Date   HGBA1C 6.4 (H) 04/03/2020   HGBA1C 6.7 (H) 11/30/2019   HGBA1C 6.9 (H) 09/07/2019   Lab Results  Component Value Date   MICROALBUR 30 11/30/2019   LDLCALC 104 (H) 11/30/2019   CREATININE 1.29 (H) 04/03/2020   BP Readings from Last 3 Encounters:  04/03/20 (!) 144/82  11/30/19 (!) 146/88  09/07/19 132/80    Goals Addressed      Patient Stated   .  "I would like to continue to work on lowering my A1C" (pt-stated)        Current Barriers:  [name] Knowledge Deficits related to Diabetes disease process and Self Health managment . Chronic Disease Management support and education needs related to DM II with stage 2 CKD, HTN  Nurse Case Manager Clinical Goal(s):  . 04/11/20 New Over the next 180 days, patient will continue to work with the CCM team and PCP for disease education and support for improved Self Health management of Diabetes  CCM RN CM Interventions:  04/11/20 call completed with patient  . Evaluation of current treatment plan related to Diabetes and patient's adherence to plan as established by provider . Provided education to patient  re: most recent A1c of 6.4; Positive reinforcement provided to patient for lowering his A1c below goal;  Re-educated patient on importance or adhering to following a Diabetic friendly diet and implementing exercise into his daily routine; Re-educated patient on target daily glycemic control FBS 80-130, after meals <180  . Reviewed medications with patient and determined patient is adhering to his prescribed treatment plan for diabetes, current regimen includes:  o Dulaglutide (TRULICITY) 1.5 IP/3.8SN SOPN, Inject 1.5 mg into the skin once a week.    o Insulin Glargine ([name] KWIKPEN) 100 UNIT/ML, Inject 0.22 mLs (22 Units total) into the skin at bedtime o Insulin Pen Needle (B-D ULTRAFINE III SHORT PEN) 31G X 8 MM MISC, USE AS DIRECTED, Disp: 100 each, Rfl: PRN o metFORMIN (GLUCOPHAGE) 1000 MG tablet, TAKE 1 TABLET BY MOUTH TWICE A DAY WITH A MEAL . Provided patient with printed educational materials related to Diabetes Care Schedule; Grocery Shopping with Diabetes; Preventing Complications from Diabetes . Advised patient, providing education and rationale, to check cbg daily before meals and record, calling the CCM team and or PCP for findings outside established parameters . Discussed plans with patient for ongoing care management follow up and provided patient with direct contact information for care management team   Patient Self Care Activities:  . Self administers medications as prescribed . Attends all scheduled provider appointments . Calls pharmacy for medication refills . Performs ADL's independently . Performs IADL's independently . Calls provider office for new concerns or questions   Please see past updates related to this goal by clicking on the "Past Updates" button in the selected goal      .  "I would like to keep my BP under good control" (pt-stated)   On track     Current Barriers:  [name] Knowledge Deficits related to disease process and Self Health management for Hypertension . Chronic Disease Management support and education needs related to DM II with stage 2 CKD, HTN  Nurse Case Manager Clinical Goal(s):  . New 04/11/20 Over the next 180 days, patient will work with the CCM team and PCP to address needs related to disease education and support for improved Self Health management of Hypertension   CCM RN CM Interventions:  04/11/20 call completed with patient  . Evaluation of current treatment plan related to Hypertension  and patient's adherence to plan as established by provider . Reviewed and discussed  patient's BP during OV is noted to be 144/82; Determined patient continues to Self check his BP at home with readings noted to be at target range . Re-educated patient re: target BP of <130/80; discussed importance of avoiding table salt, canned foods and fast food  . Reviewed medications with patient and discussed patient is adhering to his prescribed regimen; Enalapril, Amlodipine . Advised patient, providing education and rationale, to monitor blood pressure daily and record, calling the CCM and or PCP for findings outside established parameters.  . Discussed plans with patient for ongoing care management follow up and provided patient with direct contact information for care management team  Patient Self Care Activities:  . Patient verbalizes understanding of plan to reviewed printed education materials related to High Blood Pressure to review with CCM RN CM at next scheduled follow up call  . Self administers medications as prescribed . Attends all scheduled provider appointments . Calls pharmacy for medication refills . Performs ADL's independently . Performs IADL's independently . Calls provider office for new concerns or questions  Please see past updates  related to this goal by clicking on the "Past Updates" button in the selected goal      .  COMPLETED: I would like to optimize my medication management of my chronic conditions (diabetes, hypertension, hyperlipidemia) (pt-stated)        Current Barriers:  . Diabetes: T2DM; most recent A1c 6.8% on 3.17.21 o Current antihyperglycemic regimen:  metformin 1g BID (LF 8/10 #90), pioglitazone 15mg  (LF 8/14 #90), [name] 20 units, Trulicity 1.5mg  weekly; Scr 1.11 o Patient approved for [employer]-[name]/trulicity, medication delivered to patient's home 07/20/19 - Dose change form sent to [facility] for Trulicity 1.5mg  weekly - Discontinue pioglitazone once refill complete--d/c'd from med list . Current meal patterns: o Breakfast: eggs,  bacon, toast o Lunch: sandwiches, burgers  o Supper: protein, veggie, carbs(potatoes, fries, etc) o Snacks: encouraged low sugar snacks, veggies o Drinks: recommended patient avoid sugary drinks; make water the mainstay; tea/coffee (low sugar) . Current exercise: n/a . Current blood glucose readings: FBG<150 (goal <130) . Cardiovascular risk reduction: o Current hypertensive regimen: enalapril 5mg  QD (LF 9/24 #30), amlodipine 10mg  (in combo with atorvastatin-Brand name Caduet) o Current hyperlipidemia regimen: amlodipine/atorvas 10-20mg  LDL 106  Pharmacist Clinical Goal(s):  [name] Over the next 90 days, patient with work with PharmD and primary care provider to address needs related to optimized medication management of chronic conditions.  Interventions: . Comprehensive medication review performed, medication list updated in electronic medical record . Reviewed & discussed the following diabetes-related information with patient: o Follow ADA recommended "diabetes-friendly" diet  (reviewed healthy snack/food options) o Discussed insulin/GLP-1 injection technique o Reviewed medication purpose/side effects-->patient denies adverse events  Patient Self Care Activities:  . Patient will check blood glucose daily , document, and provide at future appointments . Patient will focus on medication adherence by continuing to take medications as prescribed; complete application for NovoNordisk patient assistance . Patient will take medications as prescribed . Patient will contact provider with any episodes of hypoglycemia . Patient will report any questions or concerns to provider   Please see past updates related to this goal by clicking on the "Past Updates" button in the selected goal        Plan:   Telephone follow up appointment with care management team member scheduled for: 08/09/19  Barb Merino, RN, BSN, CCM Care Management Coordinator [facility]  Direct Phone: [phone]

## 2020-04-12 NOTE — Patient Instructions (Signed)
Visit Information  Goals Addressed      Patient Stated   .  "I would like to continue to work on lowering my A1C" (pt-stated)        Current Barriers:  [name] Knowledge Deficits related to Diabetes disease process and Self Health managment . Chronic Disease Management support and education needs related to DM II with stage 2 CKD, HTN  Nurse Case Manager Clinical Goal(s):  . 04/11/20 New Over the next 180 days, patient will continue to work with the CCM team and PCP for disease education and support for improved Self Health management of Diabetes  CCM RN CM Interventions:  04/11/20 call completed with patient  . Evaluation of current treatment plan related to Diabetes and patient's adherence to plan as established by provider . Provided education to patient re: most recent A1c of 6.4; Positive reinforcement provided to patient for lowering his A1c below goal;  Re-educated patient on importance or adhering to following a Diabetic friendly diet and implementing exercise into his daily routine; Re-educated patient on target daily glycemic control FBS 80-130, after meals <180  . Reviewed medications with patient and determined patient is adhering to his prescribed treatment plan for diabetes, current regimen includes:  o Dulaglutide (TRULICITY) 1.5 WG/6.6ZL SOPN, Inject 1.5 mg into the skin once a week.  o Insulin Glargine ([name] KWIKPEN) 100 UNIT/ML, Inject 0.22 mLs (22 Units total) into the skin at bedtime o Insulin Pen Needle (B-D ULTRAFINE III SHORT PEN) 31G X 8 MM MISC, USE AS DIRECTED, Disp: 100 each, Rfl: PRN o metFORMIN (GLUCOPHAGE) 1000 MG tablet, TAKE 1 TABLET BY MOUTH TWICE A DAY WITH A MEAL . Provided patient with printed educational materials related to Diabetes Care Schedule; Grocery Shopping with Diabetes; Preventing Complications from Diabetes . Advised patient, providing education and rationale, to check cbg daily before meals and record, calling the CCM team and or PCP for findings  outside established parameters . Discussed plans with patient for ongoing care management follow up and provided patient with direct contact information for care management team   Patient Self Care Activities:  . Self administers medications as prescribed . Attends all scheduled provider appointments . Calls pharmacy for medication refills . Performs ADL's independently . Performs IADL's independently . Calls provider office for new concerns or questions   Please see past updates related to this goal by clicking on the "Past Updates" button in the selected goal      .  "I would like to keep my BP under good control" (pt-stated)   On track     Current Barriers:  [name] Knowledge Deficits related to disease process and Self Health management for Hypertension . Chronic Disease Management support and education needs related to DM II with stage 2 CKD, HTN  Nurse Case Manager Clinical Goal(s):  . New 04/11/20 Over the next 180 days, patient will work with the CCM team and PCP to address needs related to disease education and support for improved Self Health management of Hypertension   CCM RN CM Interventions:  04/11/20 call completed with patient  . Evaluation of current treatment plan related to Hypertension  and patient's adherence to plan as established by provider . Reviewed and discussed patient's BP during OV is noted to be 144/82; Determined patient continues to Self check his BP at home with readings noted to be at target range . Re-educated patient re: target BP of <130/80; discussed importance of avoiding table salt, canned foods and fast food  .  Reviewed medications with patient and discussed patient is adhering to his prescribed regimen; Enalapril, Amlodipine . Advised patient, providing education and rationale, to monitor blood pressure daily and record, calling the CCM and or PCP for findings outside established parameters.  . Discussed plans with patient for ongoing care management  follow up and provided patient with direct contact information for care management team  Patient Self Care Activities:  . Patient verbalizes understanding of plan to reviewed printed education materials related to High Blood Pressure to review with CCM RN CM at next scheduled follow up call  . Self administers medications as prescribed . Attends all scheduled provider appointments . Calls pharmacy for medication refills . Performs ADL's independently . Performs IADL's independently . Calls provider office for new concerns or questions  Please see past updates related to this goal by clicking on the "Past Updates" button in the selected goal      .  COMPLETED: I would like to optimize my medication management of my chronic conditions (diabetes, hypertension, hyperlipidemia) (pt-stated)        Current Barriers:  . Diabetes: T2DM; most recent A1c 6.8% on 3.17.21 o Current antihyperglycemic regimen:  metformin 1g BID (LF 8/10 #90), pioglitazone 15mg  (LF 8/14 #90), [name] 20 units, Trulicity 1.5mg  weekly; Scr 1.11 o Patient approved for [employer], medication delivered to patient's home 07/20/19 - Dose change form sent to [facility] for Trulicity 1.5mg  weekly - Discontinue pioglitazone once refill complete--d/c'd from med list . Current meal patterns: o Breakfast: eggs, bacon, toast o Lunch: sandwiches, burgers  o Supper: protein, veggie, carbs(potatoes, fries, etc) o Snacks: encouraged low sugar snacks, veggies o Drinks: recommended patient avoid sugary drinks; make water the mainstay; tea/coffee (low sugar) . Current exercise: n/a . Current blood glucose readings: FBG<150 (goal <130) . Cardiovascular risk reduction: o Current hypertensive regimen: enalapril 5mg  QD (LF 9/24 #30), amlodipine 10mg  (in combo with atorvastatin-Brand name Caduet) o Current hyperlipidemia regimen: amlodipine/atorvas 10-20mg  LDL 106  Pharmacist Clinical Goal(s):  [name] Over the next 90 days,  patient with work with PharmD and primary care provider to address needs related to optimized medication management of chronic conditions.  Interventions: . Comprehensive medication review performed, medication list updated in electronic medical record . Reviewed & discussed the following diabetes-related information with patient: o Follow ADA recommended "diabetes-friendly" diet  (reviewed healthy snack/food options) o Discussed insulin/GLP-1 injection technique o Reviewed medication purpose/side effects-->patient denies adverse events  Patient Self Care Activities:  . Patient will check blood glucose daily , document, and provide at future appointments . Patient will focus on medication adherence by continuing to take medications as prescribed; complete application for NovoNordisk patient assistance . Patient will take medications as prescribed . Patient will contact provider with any episodes of hypoglycemia . Patient will report any questions or concerns to provider   Please see past updates related to this goal by clicking on the "Past Updates" button in the selected goal        Patient verbalizes understanding of instructions provided today.   Telephone follow up appointment with care management team member scheduled for: 08/09/19  Barb Merino, RN, BSN, CCM Care Management Coordinator [facility]  Direct Phone: [phone]

## 2020-04-12 NOTE — Telephone Encounter (Signed)
  Chronic Care Management   Outreach Note  04/12/2020 Name: William Mcguire MRN: [id number] DOB: [birth year]  Referred by: [name], MD Reason for referral : Care Coordination   An unsuccessful telephone outreach was attempted today. The patient was referred to the case management team for assistance with care management and care coordination.   Follow Up Plan: The care management team will reach out to the patient again over the next 21 days.   Daneen Schick, BSW, CDP Social Worker, Certified Dementia Practitioner [facility] [phone]

## 2020-04-13 ENCOUNTER — Ambulatory Visit: Payer: Self-pay | Admitting: Pharmacist

## 2020-04-13 DIAGNOSIS — I10 Essential (primary) hypertension: Secondary | ICD-10-CM

## 2020-04-13 DIAGNOSIS — E1122 Type 2 diabetes mellitus with diabetic chronic kidney disease: Secondary | ICD-10-CM

## 2020-04-13 DIAGNOSIS — N182 Chronic kidney disease, stage 2 (mild): Secondary | ICD-10-CM

## 2020-04-13 NOTE — Patient Instructions (Addendum)
Visit Information Thank you for meeting with me today!  I look forward to working with you to help you meet all of your healthcare goals and answer any questions you may have.  Feel free to contact me anytime!  Goals Addressed            This Visit's Progress   . Pharmacy Care Plan       CARE PLAN ENTRY (see longitudinal plan of care for additional care plan information)  Current Barriers:  . Chronic Disease Management support, education, and care coordination needs related to Hypertension, Hyperlipidemia, and Diabetes   Hypertension BP Readings from Last 3 Encounters:  04/03/20 (!) 144/82  11/30/19 (!) 146/88  09/07/19 132/80   . Pharmacist Clinical Goal(s): o Over the next 180 days, patient will work with PharmD and providers to maintain BP goal <130/80 . Current regimen:  . Enalapril 10mg  daily   Amlodipine- atorvastatin 10-20mg  daily . Interventions: o Provided dietary and exercise recommendations . Patient self care activities - Over the next 180 days, patient will: o Check BP once monthly, document, and provide at future appointments o Ensure daily salt intake < 2300 mg/day o Use alternative seasonings besides salt (garlic, pepper, etc) o Exercise at least 30 minutes daily 5 times per week (150 minutes per week)  Hyperlipidemia Lab Results  Component Value Date/Time   LDLCALC 104 (H) 11/30/2019 10:40 AM   . Pharmacist Clinical Goal(s): o Over the next 120 days, patient will work with PharmD and providers to achieve LDL goal < 70 . Current regimen:  o Amlodipine- atorvastatin 10-40mg  daily . Interventions: o Provided dietary and exercise recommendations o Ensured increased dose of medication was started . Patient self care activities - Over the next 120 days, patient will: o Exercise at least 30 minutes daily 5 times per week (150 minutes per week) o Continue to focus on medication adherence  Diabetes Lab Results  Component Value Date/Time   HGBA1C 6.4 (H)  04/03/2020 10:47 AM   HGBA1C 6.7 (H) 11/30/2019 10:40 AM   . Pharmacist Clinical Goal(s): o Over the next 180 days, patient will work with PharmD and providers to maintain A1c goal <7% . Current regimen:  . Trulicity 1.5mg  weekly . [name] 22 units at bedtime . Metformin 1000mg  twice daily with a meal . Interventions: o Provided dietary and exercise recommendations o Discussed medication adherence o Discussed patient assistance for Trulicity and [name] - patient will need a renewal for 2022 . Patient self care activities - Over the next 180 days, patient will: o Check blood sugar once daily, document, and provide at future appointments o Contact provider with any episodes of hypoglycemia o Exercise at least 30 minutes daily 5 times per week (150 minutes per week) o Continue to focus on medication adherence  Medication management . Pharmacist Clinical Goal(s): o Over the next 180 days, patient will work with PharmD and providers to maintain optimal medication adherence . Current [facility]: [facility] . Interventions o Comprehensive medication review performed. o Continue current medication management strategy . Patient self care activities - Over the next 180 days, patient will: o Focus on medication adherence by considering adherence packaging and medication synchronization o Take medications as prescribed o Report any questions or concerns to PharmD and/or provider(s)  Please see past updates related to this goal by clicking on the "Past Updates" button in the selected goal         Mr. Ndiaye was given information about Chronic Care Management services today  including:  1. CCM service includes personalized support from designated clinical staff supervised by his physician, including individualized plan of care and coordination with other care providers 2. 24/7 contact phone numbers for assistance for urgent and routine care needs. 3. Standard insurance, coinsurance, copays and  deductibles apply for chronic care management only during months in which we provide at least 20 minutes of these services. Most insurances cover these services at 100%, however patients may be responsible for any copay, coinsurance and/or deductible if applicable. This service may help you avoid the need for more expensive face-to-face services. 4. Only one practitioner may furnish and bill the service in a calendar month. 5. The patient may stop CCM services at any time (effective at the end of the month) by phone call to the office staff.  Patient agreed to services and verbal consent obtained.   The patient verbalized understanding of instructions provided today and agreed to receive a mailed copy of patient instruction and/or educational materials. Telephone follow up appointment with pharmacy team member scheduled for: 4 months  Beverly Milch, PharmD Clinical Pharmacist [facility] [phone]  Hypertension, Adult Hypertension is another name for high blood pressure. High blood pressure forces your heart to work harder to pump blood. This can cause problems over time. There are two numbers in a blood pressure reading. There is a top number (systolic) over a bottom number (diastolic). It is best to have a blood pressure that is below 120/80. Healthy choices can help lower your blood pressure, or you may need medicine to help lower it. What are the causes? The cause of this condition is not known. Some conditions may be related to high blood pressure. What increases the risk?  Smoking.  Having type 2 diabetes mellitus, high cholesterol, or both.  Not getting enough exercise or physical activity.  Being overweight.  Having too much fat, sugar, calories, or salt (sodium) in your diet.  Drinking too much alcohol.  Having long-term (chronic) kidney disease.  Having a family history of high blood pressure.  Age. Risk increases with age.  Race. You may be at  higher risk if you are African American.  Gender. Men are at higher risk than women before age 34. After age 69, women are at higher risk than men.  Having obstructive sleep apnea.  Stress. What are the signs or symptoms?  High blood pressure may not cause symptoms. Very high blood pressure (hypertensive crisis) may cause: ? Headache. ? Feelings of worry or nervousness (anxiety). ? Shortness of breath. ? Nosebleed. ? A feeling of being sick to your stomach (nausea). ? Throwing up (vomiting). ? Changes in how you see. ? Very bad chest pain. ? Seizures. How is this treated?  This condition is treated by making healthy lifestyle changes, such as: ? Eating healthy foods. ? Exercising more. ? Drinking less alcohol.  Your health care provider may prescribe medicine if lifestyle changes are not enough to get your blood pressure under control, and if: ? Your top number is above 130. ? Your bottom number is above 80.  Your personal target blood pressure may vary. Follow these instructions at home: Eating and drinking   If told, follow the DASH eating plan. To follow this plan: ? Fill one half of your plate at each meal with fruits and vegetables. ? Fill one fourth of your plate at each meal with whole grains. Whole grains include whole-wheat pasta, brown rice, and whole-grain bread. ? Eat or drink  low-fat dairy products, such as skim milk or low-fat yogurt. ? Fill one fourth of your plate at each meal with low-fat (lean) proteins. Low-fat proteins include fish, chicken without skin, eggs, beans, and tofu. ? Avoid fatty meat, cured and processed meat, or chicken with skin. ? Avoid pre-made or processed food.  Eat less than 1,500 mg of salt each day.  Do not drink alcohol if: ? Your doctor tells you not to drink. ? You are pregnant, may be pregnant, or are planning to become pregnant.  If you drink alcohol: ? Limit how much you use to:  0-1 drink a day for women.  0-2  drinks a day for men. ? Be aware of how much alcohol is in your drink. In the [place] one drink equals one 12 oz bottle of beer (355 mL), one 5 oz glass of wine (148 mL), or one 1 oz glass of hard liquor (44 mL). Lifestyle   Work with your doctor to stay at a healthy weight or to lose weight. Ask your doctor what the best weight is for you.  Get at least 30 minutes of exercise most days of the week. This may include walking, swimming, or biking.  Get at least 30 minutes of exercise that strengthens your muscles (resistance exercise) at least 3 days a week. This may include lifting weights or doing Pilates.  Do not use any products that contain nicotine or tobacco, such as cigarettes, e-cigarettes, and chewing tobacco. If you need help quitting, ask your doctor.  Check your blood pressure at home as told by your doctor.  Keep all follow-up visits as told by your doctor. This is important. Medicines  Take over-the-counter and prescription medicines only as told by your doctor. Follow directions carefully.  Do not skip doses of blood pressure medicine. The medicine does not work as well if you skip doses. Skipping doses also puts you at risk for problems.  Ask your doctor about side effects or reactions to medicines that you should watch for. Contact a doctor if you:  Think you are having a reaction to the medicine you are taking.  Have headaches that keep coming back (recurring).  Feel dizzy.  Have swelling in your ankles.  Have trouble with your vision. Get help right away if you:  Get a very bad headache.  Start to feel mixed up (confused).  Feel weak or numb.  Feel faint.  Have very bad pain in your: ? Chest. ? Belly (abdomen).  Throw up more than once.  Have trouble breathing. Summary  Hypertension is another name for high blood pressure.  High blood pressure forces your heart to work harder to pump blood.  For most people, a normal blood pressure is less  than 120/80.  Making healthy choices can help lower blood pressure. If your blood pressure does not get lower with healthy choices, you may need to take medicine. This information is not intended to replace advice given to you by your health care provider. Make sure you discuss any questions you have with your health care provider. Document Revised: 02/17/2018 Document Reviewed: 02/17/2018 Elsevier Patient Education  2020 [employer].

## 2020-04-13 NOTE — Chronic Care Management (AMB) (Signed)
Chronic Care Management   Follow Up Note   04/13/2020 Name: William Mcguire MRN: [id number] DOB: 07/11/1955  Referred by: Sanders, Robyn, MD Reason for referral : Chronic Care Management (PharmD follow up)   William Mcguire is a 64 y.o. year old male who is a primary care patient of Sanders, Robyn, MD. The CCM team was consulted for assistance with chronic disease management and care coordination needs.    Review of patient status, including review of consultants reports, relevant laboratory and other test results, and collaboration with appropriate care team members and the patient's provider was performed as part of comprehensive patient evaluation and provision of chronic care management services.    SDOH (Social Determinants of Health) assessments performed: No See Care Plan activities for detailed interventions related to SDOH)     Outpatient Encounter Medications as of 04/13/2020  Medication Sig  . amLODipine-atorvastatin (CADUET) 10-40 MG tablet Take 1 tablet by mouth daily.  . Dulaglutide (TRULICITY) 1.5 MG/0.5ML SOPN Inject 1.5 mg into the skin once a week.   . enalapril (VASOTEC) 10 MG tablet Take 1 tablet (10 mg total) by mouth daily.  . Insulin Glargine ([name] KWIKPEN) 100 UNIT/ML Inject 0.22 mLs (22 Units total) into the skin at bedtime.  . metFORMIN (GLUCOPHAGE) 1000 MG tablet TAKE 1 TABLET BY MOUTH TWICE A DAY WITH A MEAL  . aspirin EC 81 MG tablet Take 81 mg by mouth daily.    . cyanocobalamin (,VITAMIN B-12,) 1000 MCG/ML injection INJECT 1 ML INTRAMUSCULARLY WEEKLY FOR 3 WEEKS, THEN USE ONCE MONTHLY (Patient taking differently: 1,000 mcg every 30 (thirty) days. )  . glucose blood test strip Use as instructed to test blood sugar 3 times a day. Dx code: e11.65  . Insulin Pen Needle (B-D ULTRAFINE III SHORT PEN) 31G X 8 MM MISC USE AS DIRECTED  . sildenafil (VIAGRA) 100 MG tablet TAKE 1 TABLET BY MOUTH EVERY DAY AS NEEDED  . SYRINGE-NEEDLE, DISP, 3 ML (B-D 3CC  LUER-LOK SYR 25GX5/8") 25G X 5/8" 3 ML MISC USE AS DIRECTED   No facility-administered encounter medications on file as of 04/13/2020.     Goals Addressed            This Visit's Progress   . Pharmacy Care Plan       CARE PLAN ENTRY (see longitudinal plan of care for additional care plan information)  Current Barriers:  . Chronic Disease Management support, education, and care coordination needs related to Hypertension, Hyperlipidemia, and Diabetes   Hypertension BP Readings from Last 3 Encounters:  04/03/20 (!) 144/82  11/30/19 (!) 146/88  09/07/19 132/80   . Pharmacist Clinical Goal(s): o Over the next 180 days, patient will work with PharmD and providers to maintain BP goal <130/80 . Current regimen:  . Enalapril 10mg daily   Amlodipine- atorvastatin 10-20mg daily . Interventions: o Provided dietary and exercise recommendations . Patient self care activities - Over the next 180 days, patient will: o Check BP once monthly, document, and provide at future appointments o Ensure daily salt intake < 2300 mg/day o Use alternative seasonings besides salt (garlic, pepper, etc) o Exercise at least 30 minutes daily 5 times per week (150 minutes per week)  Hyperlipidemia Lab Results  Component Value Date/Time   LDLCALC 104 (H) 11/30/2019 10:40 AM   . Pharmacist Clinical Goal(s): o Over the next 120 days, patient will work with PharmD and providers to achieve LDL goal < 70 . Current regimen:  o Amlodipine- atorvastatin 10-40mg daily .   Interventions: o Provided dietary and exercise recommendations o Ensured increased dose of medication was started . Patient self care activities - Over the next 120 days, patient will: o Exercise at least 30 minutes daily 5 times per week (150 minutes per week) o Continue to focus on medication adherence  Diabetes Lab Results  Component Value Date/Time   HGBA1C 6.4 (H) 04/03/2020 10:47 AM   HGBA1C 6.7 (H) 11/30/2019 10:40 AM    . Pharmacist Clinical Goal(s): o Over the next 180 days, patient will work with PharmD and providers to maintain A1c goal <7% . Current regimen:  . Trulicity 1.6XW weekly . [name] 22 units at bedtime . Metformin 1079m twice daily with a meal . Interventions: o Provided dietary and exercise recommendations o Discussed medication adherence o Discussed patient assistance for Trulicity and [name] - patient will need a renewal for 2022 . Patient self care activities - Over the next 180 days, patient will: o Check blood sugar once daily, document, and provide at future appointments o Contact provider with any episodes of hypoglycemia o Exercise at least 30 minutes daily 5 times per week (150 minutes per week) o Continue to focus on medication adherence  Medication management . Pharmacist Clinical Goal(s): o Over the next 180 days, patient will work with PharmD and providers to maintain optimal medication adherence . [facility] . Interventions o Comprehensive medication review performed. o Continue current medication management strategy . Patient self care activities - Over the next 180 days, patient will: o Focus on medication adherence by considering adherence packaging and medication synchronization o Take medications as prescribed o Report any questions or concerns to PharmD and/or provider(s)  Please see past updates related to this goal by clicking on the "Past Updates" button in the selected goal        Diabetes   A1c goal <7%  Recent Relevant Labs: Lab Results  Component Value Date/Time   HGBA1C 6.4 (H) 04/03/2020 10:47 AM   HGBA1C 6.7 (H) 11/30/2019 10:40 AM   MICROALBUR 30 11/30/2019 09:55 AM   MICROALBUR 80 03/17/2019 09:39 AM    Last diabetic Eye exam:  Lab Results  Component Value Date/Time   HMDIABEYEEXA No Retinopathy 08/09/2018 12:00 AM    Last diabetic Foot exam: No results found for: HMDIABFOOTEX   Checking BG: Daily  Recent FBG  Readings: 80-120s per patient  Patient has failed these meds in past: none noted Patient is currently controlled on the following medications: .Marland KitchenMetformin 10040mbid with a meal . [name] 100u/ml 22 units hs . Trulicity 1.5 mg once weekly  We discussed:   Taking metformin twice daily now as directed  Denies any hypoglycemia with blood glucose < 70  Still getting Trulicity and [name] through PAP with no issues - will need to have renewal for 2022  A1c down to 6.4 last check  He is watching his carbohydrate servings, etc  Plan  Continue current medications Hyperlipidemia   LDL goal < 70  Lipid Panel     Component Value Date/Time   CHOL 168 11/30/2019 1040   TRIG 70 11/30/2019 1040   HDL 51 11/30/2019 1040   LDLCALC 104 (H) 11/30/2019 1040    Hepatic Function Latest Ref Rng & Units 11/30/2019 05/30/2019 11/24/2018  Total Protein 6.0 - 8.5 g/dL 7.8 7.8 8.0  Albumin 3.8 - 4.8 g/dL 4.5 4.4 4.8  AST 0 - 40 IU/L _0 ALT 0 - 44 IU/L _1 Alk Phosphatase 48 -  121 IU/L 116 111 117  Total Bilirubin 0.0 - 1.2 mg/dL 0.5 0.3 0.5     The 10-year ASCVD risk score (Goff DC Jr., et al., 2013) is: 33.6%   Values used to calculate the score:     Age: 64 years     Sex: Male     Is Non-Hispanic African American: Yes     Diabetic: Yes     Tobacco smoker: No     Systolic Blood Pressure: 144 mmHg     Is BP treated: Yes     HDL Cholesterol: 51 mg/dL     Total Cholesterol: 168 mg/dL   Patient has failed these meds in past: none noted Patient is currently uncontrolled on the following medications:  . Amlodipine-atorvastatin 10-40mg  We discussed:    Denies myalgias  Dose of medication recently increased  Patient beginning to work on dietary modifications  Recommend repeat lipid panel at next OV  Plan  Continue current medications Hypertension   BP goal is:  <130/80  Office blood pressures are  BP Readings from Last 3 Encounters:  04/03/20 (!) 144/82   11/30/19 (!) 146/88  09/07/19 132/80   Patient checks BP at home daily Patient home BP readings are ranging: 120-140/80  Patient has failed these meds in the past: none noted Patient is currently uncontrolled on the following medications:  . Enalapril 10mg . Amlodipine-atorvastatin 10-40mg  We discussed   He reports it varies between the 120s and 140s systolic  Counseled on current blood pressure goal < 130/90.  Encouraged him to notify providers with consistent BP readings > 130/90.  He denies headaches and dizziness currently  Plan  Continue current medications     , PharmD Clinical Pharmacist [facility] [phone]    

## 2020-04-25 DIAGNOSIS — Z0120 Encounter for dental examination and cleaning without abnormal findings: Secondary | ICD-10-CM | POA: Diagnosis not present

## 2020-04-26 ENCOUNTER — Encounter: Payer: Self-pay | Admitting: Internal Medicine

## 2020-05-02 ENCOUNTER — Ambulatory Visit: Payer: Medicare Other

## 2020-05-02 DIAGNOSIS — E1122 Type 2 diabetes mellitus with diabetic chronic kidney disease: Secondary | ICD-10-CM

## 2020-05-02 DIAGNOSIS — I10 Essential (primary) hypertension: Secondary | ICD-10-CM

## 2020-05-02 DIAGNOSIS — N182 Chronic kidney disease, stage 2 (mild): Secondary | ICD-10-CM

## 2020-05-02 NOTE — Chronic Care Management (AMB) (Signed)
  Chronic Care Management    Social Work Follow Up Note  05/02/2020 Name: William Mcguire MRN: [id number] DOB: [birth year]  William Mcguire is a 64 y.o. year old male who is a primary care patient of [name], MD. The CCM team was consulted for assistance with care coordination.   Review of patient status, including review of consultants reports, other relevant assessments, and collaboration with appropriate care team members and the patient's provider was performed as part of comprehensive patient evaluation and provision of chronic care management services.    SDOH (Social Determinants of Health) assessments performed: Yes SDOH Interventions     Most Recent Value  SDOH Interventions  Food Insecurity Interventions Intervention Not Indicated  Housing Interventions Intervention Not Indicated  Transportation Interventions Intervention Not Indicated       Outpatient Encounter Medications as of 05/02/2020  Medication Sig  . amLODipine-atorvastatin (CADUET) 10-40 MG tablet Take 1 tablet by mouth daily.  [name] aspirin EC 81 MG tablet Take 81 mg by mouth daily.    . cyanocobalamin (,VITAMIN B-12,) 1000 MCG/ML injection INJECT 1 ML INTRAMUSCULARLY WEEKLY FOR 3 WEEKS, THEN USE ONCE MONTHLY (Patient taking differently: 1,000 mcg every 30 (thirty) days. )  . Dulaglutide (TRULICITY) 1.5 WU/9.8JX SOPN Inject 1.5 mg into the skin once a week.   . enalapril (VASOTEC) 10 MG tablet Take 1 tablet (10 mg total) by mouth daily.  [name] glucose blood test strip Use as instructed to test blood sugar 3 times a day. Dx code: e11.65  . Insulin Glargine ([name] KWIKPEN) 100 UNIT/ML Inject 0.22 mLs (22 Units total) into the skin at bedtime.  . Insulin Pen Needle (B-D ULTRAFINE III SHORT PEN) 31G X 8 MM MISC USE AS DIRECTED  . metFORMIN (GLUCOPHAGE) 1000 MG tablet TAKE 1 TABLET BY MOUTH TWICE A DAY WITH A MEAL  . sildenafil (VIAGRA) 100 MG tablet TAKE 1 TABLET BY MOUTH EVERY DAY AS NEEDED  . SYRINGE-NEEDLE, DISP, 3  ML (B-D 3CC LUER-LOK SYR 25GX5/8") 25G X 5/8" 3 ML MISC USE AS DIRECTED   No facility-administered encounter medications on file as of 05/02/2020.      Follow Up Plan: No care coordination needs identified during today's call. The patient will remain active with RN Care Manager.   Daneen Schick, BSW, CDP Social Worker, Certified Dementia Practitioner [facility] [phone]

## 2020-05-22 ENCOUNTER — Telehealth: Payer: Medicare Other

## 2020-05-31 ENCOUNTER — Telehealth: Payer: Self-pay

## 2020-05-31 NOTE — Chronic Care Management (AMB) (Signed)
    Chronic Care Management Pharmacy Assistant   Name: William Mcguire  MRN: [id number] DOB: 04/15/1956  Reason for Encounter: Medication Review  PCP : [name], MD  Allergies:  No Known Allergies  Medications: Outpatient Encounter Medications as of 05/31/2020  Medication Sig  . amLODipine-atorvastatin (CADUET) 10-40 MG tablet Take 1 tablet by mouth daily.  [name] aspirin EC 81 MG tablet Take 81 mg by mouth daily.    . cyanocobalamin (,VITAMIN B-12,) 1000 MCG/ML injection INJECT 1 ML INTRAMUSCULARLY WEEKLY FOR 3 WEEKS, THEN USE ONCE MONTHLY (Patient taking differently: 1,000 mcg every 30 (thirty) days. )  . Dulaglutide (TRULICITY) 1.5 TM/5.4YT SOPN Inject 1.5 mg into the skin once a week.   . enalapril (VASOTEC) 10 MG tablet Take 1 tablet (10 mg total) by mouth daily.  [name] glucose blood test strip Use as instructed to test blood sugar 3 times a day. Dx code: e11.65  . Insulin Glargine ([name] KWIKPEN) 100 UNIT/ML Inject 0.22 mLs (22 Units total) into the skin at bedtime.  . Insulin Pen Needle (B-D ULTRAFINE III SHORT PEN) 31G X 8 MM MISC USE AS DIRECTED  . metFORMIN (GLUCOPHAGE) 1000 MG tablet TAKE 1 TABLET BY MOUTH TWICE A DAY WITH A MEAL  . sildenafil (VIAGRA) 100 MG tablet TAKE 1 TABLET BY MOUTH EVERY DAY AS NEEDED  . SYRINGE-NEEDLE, DISP, 3 ML (B-D 3CC LUER-LOK SYR 25GX5/8") 25G X 5/8" 3 ML MISC USE AS DIRECTED   No facility-administered encounter medications on file as of 05/31/2020.    Current Diagnosis: Patient Active Problem List   Diagnosis Date Noted  . Hypertensive nephropathy 08/05/2018  . Class 1 obesity due to excess calories with serious comorbidity and body mass index (BMI) of 32.0 to 32.9 in adult 08/05/2018  . Pyelonephritis 06/23/2011  . AKI (acute kidney injury) ([place]) 06/23/2011  . Diabetes ([place]) 06/23/2011  . HTN (hypertension) 06/23/2011      Follow-Up:  Patient Assistance Coordination - New assistance application filled out to [employer] for [employer]. Waiting for provider and patient's signature and proof of income.  Called patient to have him come in to PCP office to sign application and to bring in proof of income.  Patient states he will be in to office on 06/06/2020 to sign documentation and to bring in his proof of income.  [name], CPP Notified.  William Mcguire, [facility] Clinical Pharmacist Assistant [phone]

## 2020-06-04 ENCOUNTER — Telehealth: Payer: Self-pay

## 2020-06-04 NOTE — Chronic Care Management (AMB) (Signed)
Chronic Care Management Pharmacy Assistant   Name: William Mcguire  MRN: [id number] DOB: January 21, 1956  Reason for Encounter: Disease State - Diabetes Adherence Call.   Have you seen any other providers since your last visit? No    Any changes in your medications or health ? No  PCP : [name], MD  Allergies:  No Known Allergies  Medications: Outpatient Encounter Medications as of 06/04/2020  Medication Sig  . amLODipine-atorvastatin (CADUET) 10-40 MG tablet Take 1 tablet by mouth daily.  [name] aspirin EC 81 MG tablet Take 81 mg by mouth daily.    . cyanocobalamin (,VITAMIN B-12,) 1000 MCG/ML injection INJECT 1 ML INTRAMUSCULARLY WEEKLY FOR 3 WEEKS, THEN USE ONCE MONTHLY (Patient taking differently: 1,000 mcg every 30 (thirty) days. )  . Dulaglutide (TRULICITY) 1.5 EN/4.0HW SOPN Inject 1.5 mg into the skin once a week.   . enalapril (VASOTEC) 10 MG tablet Take 1 tablet (10 mg total) by mouth daily.  [name] glucose blood test strip Use as instructed to test blood sugar 3 times a day. Dx code: e11.65  . Insulin Glargine ([name] KWIKPEN) 100 UNIT/ML Inject 0.22 mLs (22 Units total) into the skin at bedtime.  . Insulin Pen Needle (B-D ULTRAFINE III SHORT PEN) 31G X 8 MM MISC USE AS DIRECTED  . metFORMIN (GLUCOPHAGE) 1000 MG tablet TAKE 1 TABLET BY MOUTH TWICE A DAY WITH A MEAL  . sildenafil (VIAGRA) 100 MG tablet TAKE 1 TABLET BY MOUTH EVERY DAY AS NEEDED  . SYRINGE-NEEDLE, DISP, 3 ML (B-D 3CC LUER-LOK SYR 25GX5/8") 25G X 5/8" 3 ML MISC USE AS DIRECTED   No facility-administered encounter medications on file as of 06/04/2020.    Current Diagnosis: Patient Active Problem List   Diagnosis Date Noted  . Hypertensive nephropathy 08/05/2018  . Class 1 obesity due to excess calories with serious comorbidity and body mass index (BMI) of 32.0 to 32.9 in adult 08/05/2018  . Pyelonephritis 06/23/2011  . AKI (acute kidney injury) ([place]) 06/23/2011  . Diabetes ([place]) 06/23/2011  . HTN  (hypertension) 06/23/2011   Recent Relevant Labs: Lab Results  Component Value Date/Time   HGBA1C 6.4 (H) 04/03/2020 10:47 AM   HGBA1C 6.7 (H) 11/30/2019 10:40 AM   MICROALBUR 30 11/30/2019 09:55 AM   MICROALBUR 80 03/17/2019 09:39 AM    Kidney Function Lab Results  Component Value Date/Time   CREATININE 1.29 (H) 04/03/2020 10:47 AM   CREATININE 1.22 11/30/2019 10:40 AM   GFRNONAA 57 (L) 04/03/2020 10:47 AM   GFRAA 66 04/03/2020 10:47 AM    . Current antihyperglycemic regimen:  . Trulicity 1.5 mg weekly . [name] 22 units at bedtime . Metformin 1000 mg twice daily with a meal  . What recent interventions/DTPs have been made to improve glycemic control:  o Patient states he has been taking his medications as directed by provider o  . Have there been any recent hospitalizations or ED visits since last visit with CPP? No  .  . Patient denies hypoglycemic symptoms, including Pale, Sweaty, Shaky, Hungry, Nervous/irritable and Vision changes . Patient denies hyperglycemic symptoms, including blurry vision, excessive thirst, fatigue, polyuria and weakness   . How often are you checking your blood sugar?  Patient states he checks his blood sugars once a day  . What are your blood sugars ranging? Patient states fasting  blood sugars range from 99-108. o Fasting:  None  o Before meals: None  o After meals: None o Bedtime: None o  . During the  week, how often does your blood glucose drop below 70? No     . Are you checking your feet daily/regularly? Yes, patient denies any open sores or pain.  Adherence Review: Is the patient currently on a STATIN medication? Yes  Is the patient currently on ACE/ARB medication? Yes  Does the patient have >5 day gap between last estimated fill dates? No      Goals Addressed            This Visit's Progress   . Pharmacy Care Plan   On track    CARE PLAN ENTRY (see longitudinal plan of care for additional care plan  information)  Current Barriers:  . Chronic Disease Management support, education, and care coordination needs related to Hypertension, Hyperlipidemia, and Diabetes   Hypertension BP Readings from Last 3 Encounters:  04/03/20 (!) 144/82  11/30/19 (!) 146/88  09/07/19 132/80   . Pharmacist Clinical Goal(s): o Over the next 180 days, patient will work with PharmD and providers to maintain BP goal <130/80 . Current regimen:  . Enalapril 10mg  daily   Amlodipine- atorvastatin 10-20mg  daily . Interventions: o Provided dietary and exercise recommendations . Patient self care activities - Over the next 180 days, patient will: o Check BP once monthly, document, and provide at future appointments o Ensure daily salt intake < 2300 mg/day o Use alternative seasonings besides salt (garlic, pepper, etc) o Exercise at least 30 minutes daily 5 times per week (150 minutes per week)  Hyperlipidemia Lab Results  Component Value Date/Time   LDLCALC 104 (H) 11/30/2019 10:40 AM   . Pharmacist Clinical Goal(s): o Over the next 120 days, patient will work with PharmD and providers to achieve LDL goal < 70 . Current regimen:  o Amlodipine- atorvastatin 10-40mg  daily . Interventions: o Provided dietary and exercise recommendations o Ensured increased dose of medication was started . Patient self care activities - Over the next 120 days, patient will: o Exercise at least 30 minutes daily 5 times per week (150 minutes per week) o Continue to focus on medication adherence  Diabetes Lab Results  Component Value Date/Time   HGBA1C 6.4 (H) 04/03/2020 10:47 AM   HGBA1C 6.7 (H) 11/30/2019 10:40 AM   . Pharmacist Clinical Goal(s): o Over the next 180 days, patient will work with PharmD and providers to maintain A1c goal <7% . Current regimen:  . Trulicity 1.5mg  weekly . [name] 22 units at bedtime . Metformin 1000mg  twice daily with a meal . Interventions: o Provided dietary and exercise  recommendations o Discussed medication adherence o Discussed patient assistance for Trulicity and [name] - patient will need a renewal for 2022 . Patient self care activities - Over the next 180 days, patient will: o Check blood sugar once daily, document, and provide at future appointments o Contact provider with any episodes of hypoglycemia o Exercise at least 30 minutes daily 5 times per week (150 minutes per week) o Continue to focus on medication adherence  Medication management . Pharmacist Clinical Goal(s): o Over the next 180 days, patient will work with PharmD and providers to maintain optimal medication adherence . Current pharmacy: [facility] . Interventions o Comprehensive medication review performed. o Continue current medication management strategy . Patient self care activities - Over the next 180 days, patient will: o Focus on medication adherence by considering adherence packaging and medication synchronization o Take medications as prescribed o Report any questions or concerns to PharmD and/or provider(s)  Please see past updates related to this  goal by clicking on the "Past Updates" button in the selected goal         Follow-Up:  Pharmacist Review   Vallie Pearson,CPP Notified  Judithann Sheen, [place] Pharmacist Assistant [phone]

## 2020-06-13 ENCOUNTER — Other Ambulatory Visit: Payer: Self-pay | Admitting: Physician Assistant

## 2020-06-13 DIAGNOSIS — R634 Abnormal weight loss: Secondary | ICD-10-CM | POA: Diagnosis not present

## 2020-06-13 DIAGNOSIS — R110 Nausea: Secondary | ICD-10-CM

## 2020-06-13 DIAGNOSIS — R198 Other specified symptoms and signs involving the digestive system and abdomen: Secondary | ICD-10-CM | POA: Diagnosis not present

## 2020-06-15 NOTE — Chronic Care Management (AMB) (Signed)
06/12/2020- Faxed completed paperwork to [employer] for [employer] and Trulicity. [name], CPP notified.  Pattricia Boss, [place] Pharmacist Assistant [phone]

## 2020-06-19 ENCOUNTER — Other Ambulatory Visit: Payer: Self-pay | Admitting: Physician Assistant

## 2020-06-19 ENCOUNTER — Ambulatory Visit
Admission: RE | Admit: 2020-06-19 | Discharge: 2020-06-19 | Disposition: A | Discharge status: Home / Self Care | Payer: Medicare Other | Source: Ambulatory Visit | Attending: Physician Assistant | Admitting: Physician Assistant

## 2020-06-19 DIAGNOSIS — R197 Diarrhea, unspecified: Secondary | ICD-10-CM | POA: Diagnosis not present

## 2020-06-19 DIAGNOSIS — K5909 Other constipation: Secondary | ICD-10-CM

## 2020-06-19 DIAGNOSIS — K5900 Constipation, unspecified: Secondary | ICD-10-CM | POA: Diagnosis not present

## 2020-07-03 ENCOUNTER — Telehealth: Payer: Self-pay

## 2020-07-03 NOTE — Telephone Encounter (Signed)
I called the pt to confirm his dose of Trulicity and to confirm if he is on any insulin or not.

## 2020-07-03 NOTE — Telephone Encounter (Signed)
The pt confirmed that he is using 1.5 mg of Trulicity and 22 units of basaglar insulin.

## 2020-07-04 ENCOUNTER — Other Ambulatory Visit: Payer: Self-pay

## 2020-07-04 ENCOUNTER — Ambulatory Visit
Admission: RE | Admit: 2020-07-04 | Discharge: 2020-07-04 | Disposition: A | Discharge status: Home / Self Care | Payer: Medicare Other | Source: Ambulatory Visit | Attending: Physician Assistant | Admitting: Physician Assistant

## 2020-07-04 DIAGNOSIS — D3502 Benign neoplasm of left adrenal gland: Secondary | ICD-10-CM | POA: Diagnosis not present

## 2020-07-04 DIAGNOSIS — R634 Abnormal weight loss: Secondary | ICD-10-CM

## 2020-07-04 DIAGNOSIS — R110 Nausea: Secondary | ICD-10-CM

## 2020-07-04 MED ORDER — IOPAMIDOL (ISOVUE-300) INJECTION 61%
75.0000 mL | Freq: Once | INTRAVENOUS | Status: AC | PRN
  Administered 2020-07-04: 75 mL via INTRAVENOUS

## 2020-07-06 ENCOUNTER — Telehealth: Payer: Self-pay

## 2020-07-06 NOTE — Telephone Encounter (Signed)
I called the pt because Dr. Baird Cancer said he had CT ordered by GI. It had abnormality in lungs. Is he willing to have CT chest performed? also, I was unaware he lost 25 pounds in past 2 months, he needs to come in next week for further evaluation

## 2020-07-11 ENCOUNTER — Ambulatory Visit (INDEPENDENT_AMBULATORY_CARE_PROVIDER_SITE_OTHER): Payer: Medicare Other | Admitting: Internal Medicine

## 2020-07-11 ENCOUNTER — Other Ambulatory Visit: Payer: Self-pay

## 2020-07-11 ENCOUNTER — Encounter: Payer: Self-pay | Admitting: Internal Medicine

## 2020-07-11 VITALS — BP 146/82 | HR 71 | Temp 98.2°F | Ht 72.2 in | Wt 242.0 lb

## 2020-07-11 DIAGNOSIS — I129 Hypertensive chronic kidney disease with stage 1 through stage 4 chronic kidney disease, or unspecified chronic kidney disease: Secondary | ICD-10-CM | POA: Diagnosis not present

## 2020-07-11 DIAGNOSIS — Z794 Long term (current) use of insulin: Secondary | ICD-10-CM | POA: Diagnosis not present

## 2020-07-11 DIAGNOSIS — E6609 Other obesity due to excess calories: Secondary | ICD-10-CM

## 2020-07-11 DIAGNOSIS — Z6833 Body mass index (BMI) 33.0-33.9, adult: Secondary | ICD-10-CM

## 2020-07-11 DIAGNOSIS — R9389 Abnormal findings on diagnostic imaging of other specified body structures: Secondary | ICD-10-CM | POA: Diagnosis not present

## 2020-07-11 DIAGNOSIS — E1122 Type 2 diabetes mellitus with diabetic chronic kidney disease: Secondary | ICD-10-CM | POA: Diagnosis not present

## 2020-07-11 DIAGNOSIS — R634 Abnormal weight loss: Secondary | ICD-10-CM | POA: Diagnosis not present

## 2020-07-11 DIAGNOSIS — N182 Chronic kidney disease, stage 2 (mild): Secondary | ICD-10-CM

## 2020-07-11 NOTE — Progress Notes (Signed)
I,[name],acting as a Education administrator for [name], MD.,have documented all relevant documentation on the behalf of [name], MD,as directed by  [name], MD while in the presence of [name], MD.  This visit occurred during the SARS-CoV-2 public health emergency.  Safety protocols were in place, including screening questions prior to the visit, additional usage of staff PPE, and extensive cleaning of exam room while observing appropriate contact time as indicated for disinfecting solutions.  Subjective:     Patient ID: William Mcguire , male    DOB: 09/02/1955 , 65 y.o.   MRN: [id number]   Chief Complaint  Patient presents with  . Other    Discuss  CT results    HPI  He presents today to discuss recent CT abd/pelvis results. He consulted with [facility] for further evaluation of weight loss and decreased appetite. He states he has lost 25 pounds in the past two months which concerned him. They ordered CT abd/pelvis as a part of his evaluation. The results were significant as follows: 1) no acute findings within the abdomen or pelvis 2)1.9 cm benign left adrenal adenoma. 3) 2.4 cm ill-defined area of confluent opacity in anterior lingula,differential diagnosis including pneumonia and bronchogenic carcinoma.  Recommend clinical correlation for signs/symptoms of pneumonia, and short-term follow-up by chest CT without contrast.  He denies having a cough and he has never smoked. He denies recent URI as well.   Diabetes He presents for his follow-up diabetic visit. He has type 2 diabetes mellitus. His disease course has been stable. There are no hypoglycemic associated symptoms. Pertinent negatives for diabetes include no blurred vision and no chest pain. There are no hypoglycemic complications. Diabetic complications include nephropathy. Risk factors for coronary artery disease include diabetes mellitus, dyslipidemia, hypertension, male sex and sedentary lifestyle. His  breakfast blood glucose is taken between 8-9 am. His breakfast blood glucose range is generally 90-110 mg/dl.  Hypertension This is a chronic problem. The current episode started more than 1 year ago. The problem has been gradually improving since onset. The problem is controlled. Pertinent negatives include no blurred vision, chest pain, palpitations or shortness of breath.     Past Medical History:  Diagnosis Date  . Diabetes mellitus   . High cholesterol   . Hypertension      Family History  Problem Relation Age of Onset  . Hypertension Mother   . Multiple myeloma Mother   . Hypertension Father   . Diabetes Father      Current Outpatient Medications:  .  aspirin EC 81 MG tablet, Take 81 mg by mouth daily., Disp: , Rfl:  .  cyanocobalamin (,VITAMIN B-12,) 1000 MCG/ML injection, INJECT 1 ML INTRAMUSCULARLY WEEKLY FOR 3 WEEKS, THEN USE ONCE MONTHLY (Patient taking differently: 1,000 mcg every 30 (thirty) days.), Disp: 9 mL, Rfl: 3 .  Dulaglutide 1.5 MG/0.5ML SOPN, Inject 1.5 mg into the skin once a week. , Disp: , Rfl:  .  enalapril (VASOTEC) 10 MG tablet, Take 1 tablet (10 mg total) by mouth daily., Disp: 90 tablet, Rfl: 2 .  glucose blood test strip, Use as instructed to test blood sugar 3 times a day. Dx code: e11.65, Disp: 100 each, Rfl: 12 .  Insulin Glargine ([name] KWIKPEN) 100 UNIT/ML, Inject 0.22 mLs (22 Units total) into the skin at bedtime., Disp: 45 mL, Rfl: 3 .  Insulin Pen Needle (B-D ULTRAFINE III SHORT PEN) 31G X 8 MM MISC, USE AS DIRECTED, Disp: 100 each,  Rfl: PRN .  metFORMIN (GLUCOPHAGE) 1000 MG tablet, TAKE 1 TABLET BY MOUTH TWICE A DAY WITH A MEAL, Disp: 180 tablet, Rfl: 0 .  sildenafil (VIAGRA) 100 MG tablet, TAKE 1 TABLET BY MOUTH EVERY DAY AS NEEDED, Disp: 10 tablet, Rfl: 5 .  SYRINGE-NEEDLE, DISP, 3 ML (B-D 3CC LUER-LOK SYR 25GX5/8") 25G X 5/8" 3 ML MISC, USE AS DIRECTED, Disp: 12 each, Rfl: 24 .  amLODipine-atorvastatin (CADUET) 10-40 MG tablet, TAKE 1  TABLET BY MOUTH EVERY DAY, Disp: 90 tablet, Rfl: 1   No Known Allergies   Review of Systems  Constitutional: Positive for unexpected weight change.  Eyes: Negative for blurred vision.  Respiratory: Negative.  Negative for shortness of breath.   Cardiovascular: Negative.  Negative for chest pain and palpitations.  Gastrointestinal: Negative.   Neurological: Negative.   Psychiatric/Behavioral: Negative.      Today's Vitals   07/11/20 1112  BP: (!) 146/82  Pulse: 71  Temp: 98.2 F (36.8 C)  TempSrc: Oral  Weight: 242 lb (109.8 kg)  Height: 6' 0.2" (1.834 m)  PainSc: 0-No pain   Body mass index is 32.64 kg/m.  Wt Readings from Last 3 Encounters:  07/11/20 242 lb (109.8 kg)  04/03/20 250 lb (113.4 kg)  11/30/19 248 lb 3.2 oz (112.6 kg)   Objective:  Physical Exam Vitals and nursing note reviewed.  Constitutional:      Appearance: Normal appearance.  Cardiovascular:     Rate and Rhythm: Normal rate and regular rhythm.     Heart sounds: Normal heart sounds.  Pulmonary:     Effort: Pulmonary effort is normal.     Breath sounds: Normal breath sounds.  Skin:    General: Skin is warm.  Neurological:     General: No focal deficit present.     Mental Status: He is alert.  Psychiatric:        Mood and Affect: Mood normal.         Assessment And Plan:     1. Abnormal CT scan Comments: He agrees to CT chest for further evaluation of lung lesion. I will make further recommendations once his labs are available for review.  - CT Chest Wo Contrast; Future  2. Type 2 diabetes mellitus with stage 2 chronic kidney disease, with long-term current use of insulin (HCC) Comments: Chronic, I will check labs as listed below. He is encouraged to follow dietary recommendations. Recent labs reviewed from GI appt.  - Hemoglobin A1c  3. Hypertensive nephropathy Comments: Chronic, fair control. He is encouraged to follow low sodium diet.   4. Weight loss Comments: He weighed 258 in  Dec 2020. He weighed 250 lbs in Oct 2021. Of note, he is also on GLP-1 agonist which typically results in weight loss and decreased appetite.  - Prealbumin  5. Class 1 obesity due to excess calories with serious comorbidity and body mass index (BMI) of 33.0 to 33.9 in adult Comments: He has lost 8 pounds since his last visit in October 2021.    Patient was given opportunity to ask questions. Patient verbalized understanding of the plan and was able to repeat key elements of the plan. All questions were answered to their satisfaction.  [name], MD   I, [name], MD, have reviewed all documentation for this visit. The documentation on 07/26/20 for the exam, diagnosis, procedures, and orders are all accurate and complete.  THE PATIENT IS ENCOURAGED TO PRACTICE SOCIAL DISTANCING DUE TO THE COVID-19 PANDEMIC.

## 2020-07-12 LAB — HEMOGLOBIN A1C
Est. average glucose Bld gHb Est-mCnc: 146 mg/dL
Hgb A1c MFr Bld: 6.7 % — ABNORMAL HIGH (ref 4.8–5.6)

## 2020-07-12 LAB — PREALBUMIN: PREALBUMIN: 23 mg/dL (ref 10–36)

## 2020-07-16 ENCOUNTER — Telehealth: Payer: Self-pay

## 2020-07-16 DIAGNOSIS — R198 Other specified symptoms and signs involving the digestive system and abdomen: Secondary | ICD-10-CM | POA: Diagnosis not present

## 2020-07-16 DIAGNOSIS — R1084 Generalized abdominal pain: Secondary | ICD-10-CM | POA: Diagnosis not present

## 2020-07-16 DIAGNOSIS — Z86010 Personal history of colonic polyps: Secondary | ICD-10-CM | POA: Diagnosis not present

## 2020-07-16 NOTE — Telephone Encounter (Signed)
The pt was notified that the referral specialist said she is waiting on authorization from the pt's insurance company.

## 2020-07-25 ENCOUNTER — Other Ambulatory Visit: Payer: Self-pay | Admitting: Internal Medicine

## 2020-07-26 ENCOUNTER — Other Ambulatory Visit: Payer: Self-pay

## 2020-07-26 ENCOUNTER — Ambulatory Visit
Admission: RE | Admit: 2020-07-26 | Discharge: 2020-07-26 | Disposition: A | Discharge status: Home / Self Care | Payer: Medicare Other | Source: Ambulatory Visit | Attending: Internal Medicine | Admitting: Internal Medicine

## 2020-07-26 ENCOUNTER — Encounter: Payer: Self-pay | Admitting: Internal Medicine

## 2020-07-26 DIAGNOSIS — R918 Other nonspecific abnormal finding of lung field: Secondary | ICD-10-CM | POA: Diagnosis not present

## 2020-07-26 DIAGNOSIS — R9389 Abnormal findings on diagnostic imaging of other specified body structures: Secondary | ICD-10-CM

## 2020-07-27 ENCOUNTER — Telehealth: Payer: Self-pay

## 2020-07-27 ENCOUNTER — Encounter: Payer: Self-pay | Admitting: Internal Medicine

## 2020-07-27 NOTE — Telephone Encounter (Signed)
Pt wife LVM stating she has some information that his provider needed to know, and that she really wants to share this information, and she has concerns about recent test results [phone] LVM on number provided

## 2020-07-28 ENCOUNTER — Other Ambulatory Visit: Payer: Self-pay | Admitting: Internal Medicine

## 2020-07-28 DIAGNOSIS — E041 Nontoxic single thyroid nodule: Secondary | ICD-10-CM

## 2020-08-06 ENCOUNTER — Other Ambulatory Visit: Payer: Self-pay

## 2020-08-06 ENCOUNTER — Ambulatory Visit (INDEPENDENT_AMBULATORY_CARE_PROVIDER_SITE_OTHER): Payer: Medicare Other | Admitting: Internal Medicine

## 2020-08-06 ENCOUNTER — Encounter: Payer: Self-pay | Admitting: Internal Medicine

## 2020-08-06 VITALS — BP 138/88 | HR 59 | Temp 97.8°F | Ht 72.2 in | Wt 247.8 lb

## 2020-08-06 DIAGNOSIS — N182 Chronic kidney disease, stage 2 (mild): Secondary | ICD-10-CM | POA: Diagnosis not present

## 2020-08-06 DIAGNOSIS — E1122 Type 2 diabetes mellitus with diabetic chronic kidney disease: Secondary | ICD-10-CM | POA: Diagnosis not present

## 2020-08-06 DIAGNOSIS — Z6833 Body mass index (BMI) 33.0-33.9, adult: Secondary | ICD-10-CM

## 2020-08-06 DIAGNOSIS — E6609 Other obesity due to excess calories: Secondary | ICD-10-CM

## 2020-08-06 DIAGNOSIS — Z794 Long term (current) use of insulin: Secondary | ICD-10-CM | POA: Diagnosis not present

## 2020-08-06 DIAGNOSIS — I129 Hypertensive chronic kidney disease with stage 1 through stage 4 chronic kidney disease, or unspecified chronic kidney disease: Secondary | ICD-10-CM | POA: Diagnosis not present

## 2020-08-06 DIAGNOSIS — D519 Vitamin B12 deficiency anemia, unspecified: Secondary | ICD-10-CM | POA: Diagnosis not present

## 2020-08-06 DIAGNOSIS — E041 Nontoxic single thyroid nodule: Secondary | ICD-10-CM

## 2020-08-06 LAB — POCT GLUCOSE (DEVICE FOR HOME USE): POC Glucose: 86 mg/dl (ref 70–99)

## 2020-08-06 NOTE — Patient Instructions (Signed)
Diabetes Mellitus and Foot Care Foot care is an important part of your health, especially when you have diabetes. Diabetes may cause you to have problems because of poor blood flow (circulation) to your feet and legs, which can cause your skin to:  Become thinner and drier.  Break more easily.  Heal more slowly.  Peel and crack. You may also have nerve damage (neuropathy) in your legs and feet, causing decreased feeling in them. This means that you may not notice minor injuries to your feet that could lead to more serious problems. Noticing and addressing any potential problems early is the best way to prevent future foot problems. How to care for your feet Foot hygiene  Wash your feet daily with warm water and mild soap. Do not use hot water. Then, pat your feet and the areas between your toes until they are completely dry. Do not soak your feet as this can dry your skin.  Trim your toenails straight across. Do not dig under them or around the cuticle. File the edges of your nails with an emery board or nail file.  Apply a moisturizing lotion or petroleum jelly to the skin on your feet and to dry, brittle toenails. Use lotion that does not contain alcohol and is unscented. Do not apply lotion between your toes.   Shoes and socks  Wear clean socks or stockings every day. Make sure they are not too tight. Do not wear knee-high stockings since they may decrease blood flow to your legs.  Wear shoes that fit properly and have enough cushioning. Always look in your shoes before you put them on to be sure there are no objects inside.  To break in new shoes, wear them for just a few hours a day. This prevents injuries on your feet. Wounds, scrapes, corns, and calluses  Check your feet daily for blisters, cuts, bruises, sores, and redness. If you cannot see the bottom of your feet, use a mirror or ask someone for help.  Do not cut corns or calluses or try to remove them with medicine.  If you  find a minor scrape, cut, or break in the skin on your feet, keep it and the skin around it clean and dry. You may clean these areas with mild soap and water. Do not clean the area with peroxide, alcohol, or iodine.  If you have a wound, scrape, corn, or callus on your foot, look at it several times a day to make sure it is healing and not infected. Check for: ? Redness, swelling, or pain. ? Fluid or blood. ? Warmth. ? Pus or a bad smell.   General tips  Do not cross your legs. This may decrease blood flow to your feet.  Do not use heating pads or hot water bottles on your feet. They may burn your skin. If you have lost feeling in your feet or legs, you may not know this is happening until it is too late.  Protect your feet from hot and cold by wearing shoes, such as at the beach or on hot pavement.  Schedule a complete foot exam at least once a year (annually) or more often if you have foot problems. Report any cuts, sores, or bruises to your health care provider immediately. Where to find more information  American Diabetes Association: www.diabetes.org  Association of Diabetes Care & Education Specialists: www.diabeteseducator.org Contact a health care provider if:  You have a medical condition that increases your risk of infection and   you have any cuts, sores, or bruises on your feet.  You have an injury that is not healing.  You have redness on your legs or feet.  You feel burning or tingling in your legs or feet.  You have pain or cramps in your legs and feet.  Your legs or feet are numb.  Your feet always feel cold.  You have pain around any toenails. Get help right away if:  You have a wound, scrape, corn, or callus on your foot and: ? You have pain, swelling, or redness that gets worse. ? You have fluid or blood coming from the wound, scrape, corn, or callus. ? Your wound, scrape, corn, or callus feels warm to the touch. ? You have pus or a bad smell coming from  the wound, scrape, corn, or callus. ? You have a fever. ? You have a red line going up your leg. Summary  Check your feet every day for blisters, cuts, bruises, sores, and redness.  Apply a moisturizing lotion or petroleum jelly to the skin on your feet and to dry, brittle toenails.  Wear shoes that fit properly and have enough cushioning.  If you have foot problems, report any cuts, sores, or bruises to your health care provider immediately.  Schedule a complete foot exam at least once a year (annually) or more often if you have foot problems. This information is not intended to replace advice given to you by your health care provider. Make sure you discuss any questions you have with your health care provider. Document Revised: 12/29/2019 Document Reviewed: 12/29/2019 Elsevier Patient Education  2021 [employer]  

## 2020-08-06 NOTE — Progress Notes (Signed)
I,[name],acting as a Education administrator for [name], MD.,have documented all relevant documentation on the behalf of [name], MD,as directed by  [name], MD while in the presence of [name], MD.  This visit occurred during the SARS-CoV-2 public health emergency.  Safety protocols were in place, including screening questions prior to the visit, additional usage of staff PPE, and extensive cleaning of exam room while observing appropriate contact time as indicated for disinfecting solutions.  Subjective:     Patient ID: William Mcguire , male    DOB: Oct 05, 1955 , 65 y.o.   MRN: [id number]   Chief Complaint  Patient presents with  . Diabetes  . Hypertension    HPI  He is here today for diabetes and BP check. He reports compliance with meds. He denies headaches, chest pain and shortness of breath. He states his sugars are doing "pretty good".   Diabetes He presents for his follow-up diabetic visit. He has type 2 diabetes mellitus. His disease course has been stable. There are no hypoglycemic associated symptoms. Pertinent negatives for diabetes include no blurred vision and no chest pain. There are no hypoglycemic complications. Diabetic complications include nephropathy. Risk factors for coronary artery disease include diabetes mellitus, dyslipidemia, hypertension, male sex and sedentary lifestyle. His breakfast blood glucose is taken between 8-9 am. His breakfast blood glucose range is generally 90-110 mg/dl.  Hypertension This is a chronic problem. The current episode started more than 1 year ago. The problem has been gradually improving since onset. The problem is controlled. Pertinent negatives include no blurred vision, chest pain, palpitations or shortness of breath.     Past Medical History:  Diagnosis Date  . Diabetes mellitus   . High cholesterol   . Hypertension      Family History  Problem Relation Age of Onset  . Hypertension Mother   .  Multiple myeloma Mother   . Hypertension Father   . Diabetes Father      Current Outpatient Medications:  .  amLODipine-atorvastatin (CADUET) 10-40 MG tablet, TAKE 1 TABLET BY MOUTH EVERY DAY, Disp: 90 tablet, Rfl: 1 .  aspirin EC 81 MG tablet, Take 81 mg by mouth daily., Disp: , Rfl:  .  cyanocobalamin (,VITAMIN B-12,) 1000 MCG/ML injection, INJECT 1 ML INTRAMUSCULARLY WEEKLY FOR 3 WEEKS, THEN USE ONCE MONTHLY (Patient taking differently: 1,000 mcg every 30 (thirty) days.), Disp: 9 mL, Rfl: 3 .  Dulaglutide 1.5 MG/0.5ML SOPN, Inject 1.5 mg into the skin once a week. , Disp: , Rfl:  .  enalapril (VASOTEC) 10 MG tablet, Take 1 tablet (10 mg total) by mouth daily., Disp: 90 tablet, Rfl: 2 .  glucose blood test strip, Use as instructed to test blood sugar 3 times a day. Dx code: e11.65, Disp: 100 each, Rfl: 12 .  Insulin Glargine ([name] KWIKPEN) 100 UNIT/ML, Inject 0.22 mLs (22 Units total) into the skin at bedtime., Disp: 45 mL, Rfl: 3 .  Insulin Pen Needle (B-D ULTRAFINE III SHORT PEN) 31G X 8 MM MISC, USE AS DIRECTED, Disp: 100 each, Rfl: PRN .  metFORMIN (GLUCOPHAGE) 1000 MG tablet, TAKE 1 TABLET BY MOUTH TWICE A DAY WITH A MEAL, Disp: 180 tablet, Rfl: 0 .  SYRINGE-NEEDLE, DISP, 3 ML (B-D 3CC LUER-LOK SYR 25GX5/8") 25G X 5/8" 3 ML MISC, USE AS DIRECTED, Disp: 12 each, Rfl: 24 .  sildenafil (VIAGRA) 100 MG tablet, TAKE 1 TABLET BY MOUTH EVERY DAY AS NEEDED, Disp: 10 tablet, Rfl: 5  No Known Allergies   Review of Systems  Constitutional: Negative.   Eyes: Negative for blurred vision.  Respiratory: Negative.  Negative for shortness of breath.   Cardiovascular: Negative.  Negative for chest pain and palpitations.  Gastrointestinal: Negative.   Psychiatric/Behavioral: Negative.   All other systems reviewed and are negative.    Today's Vitals   08/06/20 0935  BP: 138/88  Pulse: (!) 59  Temp: 97.8 F (36.6 C)  TempSrc: Oral  Weight: 247 lb 12.8 oz (112.4 kg)  Height: 6' 0.2"  (1.834 m)  PainSc: 1   PainLoc: Back   Body mass index is 33.42 kg/m.  Wt Readings from Last 3 Encounters:  08/06/20 247 lb 12.8 oz (112.4 kg)  07/11/20 242 lb (109.8 kg)  04/03/20 250 lb (113.4 kg)   Objective:  Physical Exam Vitals and nursing note reviewed.  Constitutional:      Appearance: Normal appearance. He is obese.  HENT:     Head: Normocephalic and atraumatic.     Nose:     Comments: Masked     Mouth/Throat:     Comments: Masked  Cardiovascular:     Rate and Rhythm: Normal rate and regular rhythm.     Heart sounds: Normal heart sounds.  Pulmonary:     Breath sounds: Normal breath sounds.  Musculoskeletal:     Cervical back: Normal range of motion.  Skin:    General: Skin is warm.  Neurological:     General: No focal deficit present.     Mental Status: He is alert and oriented to person, place, and time.         Assessment And Plan:     1. Type 2 diabetes mellitus with stage 2 chronic kidney disease, with long-term current use of insulin (HCC) Comments: Chronic. He will rto in April 2022 for his next diabetes check.  - BMP8+EGFR - POCT Glucose (Device for Home Use)  2. Hypertensive nephropathy Comments: Fair control. He is aware of goal BP, less than 130/80. importance of salt restriction was d/w patient. Advised to aim for 150 minutes of exercise per wk.   3. Anemia due to vitamin B12 deficiency, unspecified B12 deficiency type Comments: I will check vitain B12 level today. His wife administers vitamin B12 injections once monthly.  - CBC no Diff - Vitamin B12  4. Thyroid nodule Comments: I will check thyroid panel today. I will make further recommendations once his labs are available for review.  - TSH - T4, Free - T3, free  5. Class 1 obesity due to excess calories with serious comorbidity and body mass index (BMI) of 33.0 to 33.9 in adult Comments: He is encouraged to strive for BMI less than 30 to decrease cardiac risk. Agian, importance of  regular exercise was d/w patient.   Patient was given opportunity to ask questions. Patient verbalized understanding of the plan and was able to repeat key elements of the plan. All questions were answered to their satisfaction.   I, [name], MD, have reviewed all documentation for this visit. The documentation on 08/26/20 for the exam, diagnosis, procedures, and orders are all accurate and complete.  THE PATIENT IS ENCOURAGED TO PRACTICE SOCIAL DISTANCING DUE TO THE COVID-19 PANDEMIC.

## 2020-08-07 LAB — BMP8+EGFR
BUN/Creatinine Ratio: 11 (ref 10–24)
BUN: 13 mg/dL (ref 8–27)
CO2: 23 mmol/L (ref 20–29)
Calcium: 9.7 mg/dL (ref 8.6–10.2)
Chloride: 103 mmol/L (ref 96–106)
Creatinine, Ser: 1.23 mg/dL (ref 0.76–1.27)
GFR calc Af Amer: 70 mL/min/{1.73_m2} (ref >59)
GFR calc non Af Amer: 61 mL/min/{1.73_m2} (ref >59)
Glucose: 92 mg/dL (ref 65–99)
Potassium: 4.8 mmol/L (ref 3.5–5.2)
Sodium: 139 mmol/L (ref 134–144)

## 2020-08-07 LAB — VITAMIN B12: Vitamin B-12: 716 pg/mL (ref 232–1245)

## 2020-08-07 LAB — CBC
Hematocrit: 37.2 % — ABNORMAL LOW (ref 37.5–51.0)
Hemoglobin: 12.2 g/dL — ABNORMAL LOW (ref 13.0–17.7)
MCH: 28 pg (ref 26.6–33.0)
MCHC: 32.8 g/dL (ref 31.5–35.7)
MCV: 85 fL (ref 79–97)
Platelets: 236 10*3/uL (ref 150–450)
RBC: 4.36 x10E6/uL (ref 4.14–5.80)
RDW: 13.6 % (ref 11.6–15.4)
WBC: 5.2 10*3/uL (ref 3.4–10.8)

## 2020-08-07 LAB — T4, FREE: Free T4: 1.07 ng/dL (ref 0.82–1.77)

## 2020-08-07 LAB — TSH: TSH: 2.33 u[IU]/mL (ref 0.450–4.500)

## 2020-08-07 LAB — T3, FREE: T3, Free: 5.1 pg/mL — ABNORMAL HIGH (ref 2.0–4.4)

## 2020-08-08 ENCOUNTER — Telehealth: Payer: Medicare Other

## 2020-08-09 ENCOUNTER — Ambulatory Visit
Admission: RE | Admit: 2020-08-09 | Discharge: 2020-08-09 | Disposition: A | Discharge status: Home / Self Care | Payer: Medicare Other | Source: Ambulatory Visit | Attending: Internal Medicine | Admitting: Internal Medicine

## 2020-08-09 DIAGNOSIS — E041 Nontoxic single thyroid nodule: Secondary | ICD-10-CM

## 2020-08-09 DIAGNOSIS — E042 Nontoxic multinodular goiter: Secondary | ICD-10-CM | POA: Diagnosis not present

## 2020-08-13 ENCOUNTER — Telehealth: Payer: Self-pay

## 2020-08-13 NOTE — Progress Notes (Signed)
08/13/20-Attempted to outreach the patient regarding upcoming CCM Call appointment tomorrow 08/14/20 at 12:30 pm with [name], CPP. Left a voicemail of appointment date and time and to have medications/supplements near during phone call.  [name], CPP Notified.  Raynelle Highland, [place] Pharmacist Assistant [phone] CCM Total Time: 4 minutes

## 2020-08-14 ENCOUNTER — Telehealth: Payer: Self-pay

## 2020-08-15 ENCOUNTER — Other Ambulatory Visit: Payer: Self-pay | Admitting: Internal Medicine

## 2020-08-15 NOTE — Telephone Encounter (Signed)
Viagra refill

## 2020-08-17 ENCOUNTER — Telehealth: Payer: Self-pay

## 2020-08-17 ENCOUNTER — Telehealth: Payer: Medicare Other

## 2020-08-17 NOTE — Telephone Encounter (Signed)
  Chronic Care Management   Outreach Note  08/17/2020 Name: William Mcguire MRN: [id number] DOB: 08-13-1955  Referred by: [name], MD Reason for referral : No chief complaint on file.   An unsuccessful telephone outreach was attempted today. The patient was referred to the case management team for assistance with care management and care coordination.   Follow Up Plan: A HIPAA compliant phone message was left for the patient providing contact information and requesting a return call.  Telephone follow up appointment with care management team member scheduled for: 09/05/20  Barb Merino, RN, BSN, CCM Care Management Coordinator [place] [facility]  Direct Phone: [phone]

## 2020-08-28 ENCOUNTER — Telehealth: Payer: Self-pay

## 2020-08-28 NOTE — Telephone Encounter (Signed)
Per RS: see your note dated 2/4 please. pls call wife back to see what her concerns are. pt did not mention anything during his visit. Thanks!  LVM 2x's for pt wife to call the office regarding her concerns

## 2020-08-29 ENCOUNTER — Telehealth: Payer: Self-pay

## 2020-08-30 ENCOUNTER — Telehealth: Payer: Medicare Other

## 2020-09-04 NOTE — Progress Notes (Signed)
09/04/20-Attempted to outreach the patient to remind him of CCM Call follow-up tomorrow 09/05/20 at 3:30 PM with [name], CPP. No answer. Left a voicemail of appointment date and time. Patient aware in the voicemail to have his medications and supplements near during the phone visit.  [name], CPP Notified.  Raynelle Highland, [place] Pharmacist Assistant [phone]

## 2020-09-05 ENCOUNTER — Telehealth: Payer: Self-pay

## 2020-09-05 ENCOUNTER — Telehealth: Payer: Medicare Other

## 2020-09-05 ENCOUNTER — Ambulatory Visit (INDEPENDENT_AMBULATORY_CARE_PROVIDER_SITE_OTHER): Payer: Medicare Other

## 2020-09-05 ENCOUNTER — Other Ambulatory Visit (INDEPENDENT_AMBULATORY_CARE_PROVIDER_SITE_OTHER): Payer: Medicare Other

## 2020-09-05 DIAGNOSIS — I129 Hypertensive chronic kidney disease with stage 1 through stage 4 chronic kidney disease, or unspecified chronic kidney disease: Secondary | ICD-10-CM

## 2020-09-05 DIAGNOSIS — E1122 Type 2 diabetes mellitus with diabetic chronic kidney disease: Secondary | ICD-10-CM

## 2020-09-05 DIAGNOSIS — Z794 Long term (current) use of insulin: Secondary | ICD-10-CM | POA: Diagnosis not present

## 2020-09-05 DIAGNOSIS — N182 Chronic kidney disease, stage 2 (mild): Secondary | ICD-10-CM | POA: Diagnosis not present

## 2020-09-05 DIAGNOSIS — D649 Anemia, unspecified: Secondary | ICD-10-CM | POA: Diagnosis not present

## 2020-09-05 LAB — HEMOCCULT GUIAC POC 1CARD (OFFICE)
Card #2 Fecal Occult Blod, POC: NEGATIVE
Card #3 Fecal Occult Blood, POC: NEGATIVE
Fecal Occult Blood, POC: NEGATIVE

## 2020-09-05 NOTE — Telephone Encounter (Signed)
I left the pt a message that his stool cards was negative for blood and for him to call back to let the office know that he received the message.

## 2020-09-06 NOTE — Patient Instructions (Signed)
Goals Addressed      Other   .  Monitor and Manage My Blood Sugar-Diabetes Type 2   On track     Timeframe:  Long-Range Goal Priority:  High Start Date: 09/05/20                            Expected End Date: 03/08/21                      Follow Up Date: 10/17/20    Patient Goals/Self-Care Activities:  Self administers oral medications as prescribed Attends all scheduled provider appointments Checks blood sugars as prescribed and utilize hyper and hypoglycemia protocol as needed Adheres to prescribed ADA/carb modified   Why is this important?    Checking your blood sugar at home helps to keep it from getting very high or very low.   Writing the results in a diary or log helps the doctor know how to care for you.   Your blood sugar log should have the time, date and the results.   Also, write down the amount of insulin or other medicine that you take.   Other information, like what you ate, exercise done and how you were feeling, will also be helpful.     Notes:     .  Obtain Eye Exam-Diabetes Type 2        Timeframe:  Long-Range Goal Priority:  Medium Start Date: 09/05/20                           Expected End Date:  03/08/21                      Follow Up Date: 10/17/20   - keep appointment with eye doctor - schedule appointment with eye doctor    Why is this important?    Eye check-ups are important when you have diabetes.   Vision loss can be prevented.    Notes:     .  Perform Foot Care-Diabetes Type 2        Timeframe:  Long-Range Goal Priority:  Medium Start Date:  09/05/20                           Expected End Date:  03/08/21                     Follow Up Date: 10/17/20   - check feet daily for cuts, sores or redness - keep feet up while sitting - trim toenails straight across - wash and dry feet carefully every day - wear comfortable, cotton socks - wear comfortable, well-fitting shoes    Why is this important?    Good foot care is very important when  you have diabetes.   There are many things you can do to keep your feet healthy and catch a problem early.    Notes:     .  Track and Manage My Blood Pressure-Hypertension   On track     Timeframe:  Long-Range Goal Priority:  Medium Start Date:  09/05/20                           Expected End Date:  03/08/21  Follow Up Date: 10/17/20    - check blood pressure 3 times per week - write blood pressure results in a log or diary  - continue to follow a low Sodium diet    Why is this important?    You won't feel high blood pressure, but it can still hurt your blood vessels.   High blood pressure can cause heart or kidney problems. It can also cause a stroke.   Making lifestyle changes like losing a Cohan Stipes weight or eating less salt will help.   Checking your blood pressure at home and at different times of the day can help to control blood pressure.   If the doctor prescribes medicine remember to take it the way the doctor ordered.   Call the office if you cannot afford the medicine or if there are questions about it.     Notes:

## 2020-09-06 NOTE — Chronic Care Management (AMB) (Signed)
Chronic Care Management   CCM RN Visit Note  09/05/2020 Name: William Mcguire MRN: [id number] DOB: Jun 13, 1956  Subjective: William Mcguire is a 64 y.o. year old male who is a primary care patient of [name], MD. The care management team was consulted for assistance with disease management and care coordination needs.    Engaged with patient by telephone for follow up visit in response to provider referral for case management and/or care coordination services.   Consent to Services:  The patient was given information about Chronic Care Management services, agreed to services, and gave verbal consent prior to initiation of services.  Please see initial visit note for detailed documentation.   Patient agreed to services and verbal consent obtained.   Assessment: Review of patient past medical history, allergies, medications, health status, including review of consultants reports, laboratory and other test data, was performed as part of comprehensive evaluation and provision of chronic care management services.   SDOH (Social Determinants of Health) assessments and interventions performed:    CCM Care Plan  No Known Allergies  Outpatient Encounter Medications as of 09/05/2020  Medication Sig  . amLODipine-atorvastatin (CADUET) 10-40 MG tablet TAKE 1 TABLET BY MOUTH EVERY DAY  . aspirin EC 81 MG tablet Take 81 mg by mouth daily.  . cyanocobalamin (,VITAMIN B-12,) 1000 MCG/ML injection INJECT 1 ML INTRAMUSCULARLY WEEKLY FOR 3 WEEKS, THEN USE ONCE MONTHLY (Patient taking differently: 1,000 mcg every 30 (thirty) days.)  . Dulaglutide 1.5 MG/0.5ML SOPN Inject 1.5 mg into the skin once a week.   . enalapril (VASOTEC) 10 MG tablet Take 1 tablet (10 mg total) by mouth daily.  [name] glucose blood test strip Use as instructed to test blood sugar 3 times a day. Dx code: e11.65  . Insulin Glargine ([name] KWIKPEN) 100 UNIT/ML Inject 0.22 mLs (22 Units total) into the skin at bedtime.  . Insulin  Pen Needle (B-D ULTRAFINE III SHORT PEN) 31G X 8 MM MISC USE AS DIRECTED  . metFORMIN (GLUCOPHAGE) 1000 MG tablet TAKE 1 TABLET BY MOUTH TWICE A DAY WITH A MEAL  . sildenafil (VIAGRA) 100 MG tablet TAKE 1 TABLET BY MOUTH EVERY DAY AS NEEDED  . SYRINGE-NEEDLE, DISP, 3 ML (B-D 3CC LUER-LOK SYR 25GX5/8") 25G X 5/8" 3 ML MISC USE AS DIRECTED   No facility-administered encounter medications on file as of 09/05/2020.    Patient Active Problem List   Diagnosis Date Noted  . Hypertensive nephropathy 08/05/2018  . Class 1 obesity due to excess calories with serious comorbidity and body mass index (BMI) of 32.0 to 32.9 in adult 08/05/2018  . Pyelonephritis 06/23/2011  . AKI (acute kidney injury) ([place]) 06/23/2011  . Diabetes ([place]) 06/23/2011  . HTN (hypertension) 06/23/2011    Conditions to be addressed/monitored:DM II with stage 2 CKD, HTN  Care Plan : Diabetes Type 2 (Adult)  Updates made by Lynne Logan, RN since 09/06/2020 12:00 AM    Problem: Glycemic Management (Diabetes, Type 2)   Priority: High    Long-Range Goal: Glycemic Management Optimized   Start Date: 09/06/2020  Expected End Date: 03/08/2021  This Visit's Progress: On track  Priority: High  Note:   Objective:  Lab Results  Component Value Date   HGBA1C 6.7 (H) 07/11/2020 .   Lab Results  Component Value Date   CREATININE 1.23 08/06/2020   CREATININE 1.29 (H) 04/03/2020   CREATININE 1.22 11/30/2019 .   [name] No results found for: EGFR Current Barriers:  [name] Knowledge Deficits related to  basic Diabetes pathophysiology and self care/management . Knowledge Deficits related to medications used for management of diabetes Case Manager Clinical Goal(s):  . patient will demonstrate improved adherence to prescribed treatment plan for diabetes self care/management as evidenced by: daily monitoring and recording of CBG  adherence to ADA/ carb modified diet exercise 5 days/week adherence to prescribed medication regimen contacting  provider for new or worsened symptoms or questions Interventions:  . Collaboration with [name], MD regarding development and update of comprehensive plan of care as evidenced by provider attestation and co-signature . Inter-disciplinary care team collaboration (see longitudinal plan of care) . Provided education to patient about basic DM disease process . Educated on dietary and exercise recommendations  . Review of patient status, including review of consultants reports, relevant laboratory and other test results, and medications completed. . Reviewed medications with patient and discussed importance of medication adherence . Provided patient with written educational materials related to hypo and hyperglycemia and importance of correct treatment . Advised patient, providing education and rationale, to check cbg daily before meals and at bedtime and record, calling the CCM team and or PCP for findings outside established parameters.   . Reviewed scheduled/upcoming provider appointments including: next PCP follow appointment scheduled for: 10/09/20 @11 :15 PM  . Discussed plans with patient for ongoing care management follow up and provided patient with direct contact information for care management team Self-Care Activities - Self administers oral medications as prescribed Attends all scheduled provider appointments Checks blood sugars as prescribed and utilize hyper and hypoglycemia protocol as needed Adheres to prescribed ADA/carb modified Patient Goals: - check blood sugar at prescribed times - check blood sugar before and after exercise - check blood sugar if I feel it is too high or too low - take the blood sugar log to all doctor visits - take the blood sugar meter to all doctor visits  Follow Up Plan: Telephone follow up appointment with care management team member scheduled for: 10/17/20   Care Plan : Hypertension (Adult)  Updates made by Lynne Logan, RN since 09/06/2020  12:00 AM    Problem: Disease Progression (Hypertension)   Priority: Medium    Long-Range Goal: Disease Progression Prevented or Minimized   Start Date: 09/05/2020  Expected End Date: 03/08/2021  This Visit's Progress: On track  Priority: Medium  Note:   Objective:  . Last practice recorded BP readings:  BP Readings from Last 3 Encounters:  08/06/20 138/88  07/11/20 (!) 146/82  04/03/20 (!) 144/82 .   [name] Most recent eGFR/CrCl: No results found for: EGFR  No components found for: CRCL Current Barriers:  [name] Knowledge Deficits related to basic understanding of hypertension pathophysiology and self care management . Knowledge Deficits related to understanding of medications prescribed for management of hypertension Case Manager Clinical Goal(s):  . patient will demonstrate improved adherence to prescribed treatment plan for hypertension as evidenced by taking all medications as prescribed, monitoring and recording blood pressure as directed, adhering to low sodium/DASH diet Interventions:  . Collaboration with [name], MD regarding development and update of comprehensive plan of care as evidenced by provider attestation and co-signature . Inter-disciplinary care team collaboration (see longitudinal plan of care) . Evaluation of current treatment plan related to hypertension self management and patient's adherence to plan as established by provider. . Provided education to patient re: stroke prevention, s/s of heart attack and stroke, DASH diet, complications of uncontrolled blood pressure . Reviewed medications with patient and discussed importance of compliance . Advised  patient, providing education and rationale, to monitor blood pressure daily and record, calling PCP for findings outside established parameters.  . Reviewed scheduled/upcoming provider appointments including: next PCP follow up scheduled for 10/09/20 @11 :15 AM  . Discussed plans with patient for ongoing care management  follow up and provided patient with direct contact information for care management team Self-Care Activities: - Self administers medications as prescribed Attends all scheduled provider appointments Calls provider office for new concerns, questions, or BP outside discussed parameters Checks BP and records as discussed Follows a low sodium diet/DASH diet Patient Goals: - check blood pressure 3 times per week - write blood pressure results in a log or diary  - continue to follow a low Sodium diet   Follow Up Plan: Telephone follow up appointment with care management team member scheduled for: 10/17/20    Plan:Telephone follow up appointment with care management team member scheduled for:  10/17/20  Barb Merino, RN, BSN, CCM Care Management Coordinator [place] [facility]  Direct Phone: [phone]

## 2020-10-09 ENCOUNTER — Ambulatory Visit (INDEPENDENT_AMBULATORY_CARE_PROVIDER_SITE_OTHER): Payer: Medicare Other | Admitting: Internal Medicine

## 2020-10-09 ENCOUNTER — Other Ambulatory Visit: Payer: Self-pay

## 2020-10-09 ENCOUNTER — Encounter: Payer: Self-pay | Admitting: Internal Medicine

## 2020-10-09 VITALS — BP 144/72 | HR 69 | Temp 98.1°F | Ht 72.2 in | Wt 247.6 lb

## 2020-10-09 DIAGNOSIS — I129 Hypertensive chronic kidney disease with stage 1 through stage 4 chronic kidney disease, or unspecified chronic kidney disease: Secondary | ICD-10-CM

## 2020-10-09 DIAGNOSIS — D519 Vitamin B12 deficiency anemia, unspecified: Secondary | ICD-10-CM

## 2020-10-09 DIAGNOSIS — E6609 Other obesity due to excess calories: Secondary | ICD-10-CM

## 2020-10-09 DIAGNOSIS — E1122 Type 2 diabetes mellitus with diabetic chronic kidney disease: Secondary | ICD-10-CM

## 2020-10-09 DIAGNOSIS — R011 Cardiac murmur, unspecified: Secondary | ICD-10-CM

## 2020-10-09 DIAGNOSIS — N182 Chronic kidney disease, stage 2 (mild): Secondary | ICD-10-CM

## 2020-10-09 DIAGNOSIS — Z794 Long term (current) use of insulin: Secondary | ICD-10-CM | POA: Diagnosis not present

## 2020-10-09 DIAGNOSIS — R918 Other nonspecific abnormal finding of lung field: Secondary | ICD-10-CM

## 2020-10-09 DIAGNOSIS — Z6833 Body mass index (BMI) 33.0-33.9, adult: Secondary | ICD-10-CM

## 2020-10-09 NOTE — Progress Notes (Signed)
I,[name],acting as a Education administrator for [name], MD.,have documented all relevant documentation on the behalf of [name], MD,as directed by  [name], MD while in the presence of [name], MD.  This visit occurred during the SARS-CoV-2 public health emergency.  Safety protocols were in place, including screening questions prior to the visit, additional usage of staff PPE, and extensive cleaning of exam room while observing appropriate contact time as indicated for disinfecting solutions.  Subjective:     Patient ID: William Mcguire , male    DOB: Aug 09, 1955 , 65 y.o.   MRN: [id number]   Chief Complaint  Patient presents with  . Diabetes  . Hypertension    HPI  He is here today for diabetes/HTN f/u. He reports compliance with meds. He states he is exercising. He states his sugars range from 98-140/   Diabetes He presents for his follow-up diabetic visit. He has type 2 diabetes mellitus. His disease course has been stable. There are no hypoglycemic associated symptoms. Pertinent negatives for diabetes include no blurred vision and no chest pain. There are no hypoglycemic complications. Diabetic complications include nephropathy. Risk factors for coronary artery disease include diabetes mellitus, dyslipidemia, hypertension, male sex and sedentary lifestyle. His breakfast blood glucose is taken between 8-9 am. His breakfast blood glucose range is generally 90-110 mg/dl.  Hypertension This is a chronic problem. The current episode started more than 1 year ago. The problem has been gradually improving since onset. The problem is controlled. Pertinent negatives include no blurred vision, chest pain, palpitations or shortness of breath.     Past Medical History:  Diagnosis Date  . Diabetes mellitus   . High cholesterol   . Hypertension      Family History  Problem Relation Age of Onset  . Hypertension Mother   . Multiple myeloma Mother   . Hypertension  Father   . Diabetes Father      Current Outpatient Medications:  .  amLODipine-atorvastatin (CADUET) 10-40 MG tablet, TAKE 1 TABLET BY MOUTH EVERY DAY, Disp: 90 tablet, Rfl: 1 .  aspirin EC 81 MG tablet, Take 81 mg by mouth daily., Disp: , Rfl:  .  cyanocobalamin (,VITAMIN B-12,) 1000 MCG/ML injection, INJECT 1 ML INTRAMUSCULARLY WEEKLY FOR 3 WEEKS, THEN USE ONCE MONTHLY (Patient taking differently: 1,000 mcg every 30 (thirty) days.), Disp: 9 mL, Rfl: 3 .  Dulaglutide 1.5 MG/0.5ML SOPN, Inject 1.5 mg into the skin once a week. , Disp: , Rfl:  .  enalapril (VASOTEC) 10 MG tablet, Take 1 tablet (10 mg total) by mouth daily., Disp: 90 tablet, Rfl: 2 .  glucose blood test strip, Use as instructed to test blood sugar 3 times a day. Dx code: e11.65, Disp: 100 each, Rfl: 12 .  Insulin Glargine ([name] KWIKPEN) 100 UNIT/ML, Inject 0.22 mLs (22 Units total) into the skin at bedtime., Disp: 45 mL, Rfl: 3 .  Insulin Pen Needle (B-D ULTRAFINE III SHORT PEN) 31G X 8 MM MISC, USE AS DIRECTED, Disp: 100 each, Rfl: PRN .  metFORMIN (GLUCOPHAGE) 1000 MG tablet, TAKE 1 TABLET BY MOUTH TWICE A DAY WITH A MEAL, Disp: 180 tablet, Rfl: 0 .  sildenafil (VIAGRA) 100 MG tablet, TAKE 1 TABLET BY MOUTH EVERY DAY AS NEEDED, Disp: 10 tablet, Rfl: 5 .  SYRINGE-NEEDLE, DISP, 3 ML (B-D 3CC LUER-LOK SYR 25GX5/8") 25G X 5/8" 3 ML MISC, USE AS DIRECTED, Disp: 12 each, Rfl: 24   No Known Allergies   Review of  Systems  Constitutional: Negative.   Eyes: Negative for blurred vision.  Respiratory: Negative.  Negative for shortness of breath.   Cardiovascular: Negative.  Negative for chest pain and palpitations.  Gastrointestinal: Negative.   Psychiatric/Behavioral: Negative.   All other systems reviewed and are negative.    Today's Vitals   10/09/20 1112  BP: (!) 144/72  Pulse: 69  Temp: 98.1 F (36.7 C)  TempSrc: Oral  Weight: 247 lb 9.6 oz (112.3 kg)  Height: 6' 0.2" (1.834 m)  PainSc: 0-No pain   Body mass  index is 33.39 kg/m.  Wt Readings from Last 3 Encounters:  10/09/20 247 lb 9.6 oz (112.3 kg)  08/06/20 247 lb 12.8 oz (112.4 kg)  07/11/20 242 lb (109.8 kg)   Objective:  Physical Exam Vitals and nursing note reviewed.  Constitutional:      Appearance: Normal appearance. He is obese.  HENT:     Head: Normocephalic and atraumatic.     Nose:     Comments: Masked     Mouth/Throat:     Comments: Masked  Cardiovascular:     Rate and Rhythm: Normal rate and regular rhythm.     Heart sounds: Normal heart sounds.  Pulmonary:     Effort: Pulmonary effort is normal.     Breath sounds: Normal breath sounds.  Skin:    General: Skin is warm.  Neurological:     General: No focal deficit present.     Mental Status: He is alert.  Psychiatric:        Mood and Affect: Mood normal.         Assessment And Plan:     1. Type 2 diabetes mellitus with stage 2 chronic kidney disease, with long-term current use of insulin (HCC) Comments: Chronic, I will check labs as listed below. He is encouraged to comply with dietary guidelines.  - BMP8+EGFR - Hemoglobin A1c  2. Hypertensive nephropathy Comments: Uncontrolled. I will not change meds today.  He got some unsettling news regarding his boat just prior to his appt.   3. Abnormal CT scan of lung Comments: Due to anterior left upper lobe 2.7 x 1.5 cm nodular lesion, I will schedule repeat CT chest to be performed in late May. - CT Chest Wo Contrast; Future  4. Murmur Comments: I will check 2d echocardiogram for further evaluation.  - ECHOCARDIOGRAM COMPLETE; Future  5. Class 1 obesity due to excess calories with serious comorbidity and body mass index (BMI) of 33.0 to 33.9 in adult Comments:  He is encouraged to strive for BMI less than 30 to decrease cardiac risk. Advised to aim for at least 150 minutes of exercise per week.  Patient was given opportunity to ask questions. Patient verbalized understanding of the plan and was able to repeat  key elements of the plan. All questions were answered to their satisfaction.   I, [name], MD, have reviewed all documentation for this visit. The documentation on 10/09/20 for the exam, diagnosis, procedures, and orders are all accurate and complete.   IF YOU HAVE BEEN REFERRED TO A SPECIALIST, IT MAY TAKE 1-2 WEEKS TO SCHEDULE/PROCESS THE REFERRAL. IF YOU HAVE NOT HEARD FROM US/SPECIALIST IN TWO WEEKS, PLEASE GIVE [place] A CALL AT [phone].   THE PATIENT IS ENCOURAGED TO PRACTICE SOCIAL DISTANCING DUE TO THE COVID-19 PANDEMIC.

## 2020-10-09 NOTE — Patient Instructions (Signed)
Diabetes Mellitus and Foot Care Foot care is an important part of your health, especially when you have diabetes. Diabetes may cause you to have problems because of poor blood flow (circulation) to your feet and legs, which can cause your skin to:  Become thinner and drier.  Break more easily.  Heal more slowly.  Peel and crack. You may also have nerve damage (neuropathy) in your legs and feet, causing decreased feeling in them. This means that you may not notice minor injuries to your feet that could lead to more serious problems. Noticing and addressing any potential problems early is the best way to prevent future foot problems. How to care for your feet Foot hygiene  Wash your feet daily with warm water and mild soap. Do not use hot water. Then, pat your feet and the areas between your toes until they are completely dry. Do not soak your feet as this can dry your skin.  Trim your toenails straight across. Do not dig under them or around the cuticle. File the edges of your nails with an emery board or nail file.  Apply a moisturizing lotion or petroleum jelly to the skin on your feet and to dry, brittle toenails. Use lotion that does not contain alcohol and is unscented. Do not apply lotion between your toes.   Shoes and socks  Wear clean socks or stockings every day. Make sure they are not too tight. Do not wear knee-high stockings since they may decrease blood flow to your legs.  Wear shoes that fit properly and have enough cushioning. Always look in your shoes before you put them on to be sure there are no objects inside.  To break in new shoes, wear them for just a few hours a day. This prevents injuries on your feet. Wounds, scrapes, corns, and calluses  Check your feet daily for blisters, cuts, bruises, sores, and redness. If you cannot see the bottom of your feet, use a mirror or ask someone for help.  Do not cut corns or calluses or try to remove them with medicine.  If you  find a minor scrape, cut, or break in the skin on your feet, keep it and the skin around it clean and dry. You may clean these areas with mild soap and water. Do not clean the area with peroxide, alcohol, or iodine.  If you have a wound, scrape, corn, or callus on your foot, look at it several times a day to make sure it is healing and not infected. Check for: ? Redness, swelling, or pain. ? Fluid or blood. ? Warmth. ? Pus or a bad smell.   General tips  Do not cross your legs. This may decrease blood flow to your feet.  Do not use heating pads or hot water bottles on your feet. They may burn your skin. If you have lost feeling in your feet or legs, you may not know this is happening until it is too late.  Protect your feet from hot and cold by wearing shoes, such as at the beach or on hot pavement.  Schedule a complete foot exam at least once a year (annually) or more often if you have foot problems. Report any cuts, sores, or bruises to your health care provider immediately. Where to find more information  American Diabetes Association: www.diabetes.org  Association of Diabetes Care & Education Specialists: www.diabeteseducator.org Contact a health care provider if:  You have a medical condition that increases your risk of infection and   you have any cuts, sores, or bruises on your feet.  You have an injury that is not healing.  You have redness on your legs or feet.  You feel burning or tingling in your legs or feet.  You have pain or cramps in your legs and feet.  Your legs or feet are numb.  Your feet always feel cold.  You have pain around any toenails. Get help right away if:  You have a wound, scrape, corn, or callus on your foot and: ? You have pain, swelling, or redness that gets worse. ? You have fluid or blood coming from the wound, scrape, corn, or callus. ? Your wound, scrape, corn, or callus feels warm to the touch. ? You have pus or a bad smell coming from  the wound, scrape, corn, or callus. ? You have a fever. ? You have a red line going up your leg. Summary  Check your feet every day for blisters, cuts, bruises, sores, and redness.  Apply a moisturizing lotion or petroleum jelly to the skin on your feet and to dry, brittle toenails.  Wear shoes that fit properly and have enough cushioning.  If you have foot problems, report any cuts, sores, or bruises to your health care provider immediately.  Schedule a complete foot exam at least once a year (annually) or more often if you have foot problems. This information is not intended to replace advice given to you by your health care provider. Make sure you discuss any questions you have with your health care provider. Document Revised: 12/29/2019 Document Reviewed: 12/29/2019 Elsevier Patient Education  2021 [employer]  

## 2020-10-10 LAB — BMP8+EGFR
BUN/Creatinine Ratio: 10 (ref 10–24)
BUN: 13 mg/dL (ref 8–27)
CO2: 21 mmol/L (ref 20–29)
Calcium: 9.7 mg/dL (ref 8.6–10.2)
Chloride: 102 mmol/L (ref 96–106)
Creatinine, Ser: 1.25 mg/dL (ref 0.76–1.27)
Glucose: 91 mg/dL (ref 65–99)
Potassium: 4.7 mmol/L (ref 3.5–5.2)
Sodium: 141 mmol/L (ref 134–144)
eGFR: 64 mL/min/{1.73_m2} (ref >59)

## 2020-10-10 LAB — HEMOGLOBIN A1C
Est. average glucose Bld gHb Est-mCnc: 134 mg/dL
Hgb A1c MFr Bld: 6.3 % — ABNORMAL HIGH (ref 4.8–5.6)

## 2020-10-11 DIAGNOSIS — H5203 Hypermetropia, bilateral: Secondary | ICD-10-CM | POA: Diagnosis not present

## 2020-10-11 LAB — HM DIABETES EYE EXAM

## 2020-10-14 DIAGNOSIS — R918 Other nonspecific abnormal finding of lung field: Secondary | ICD-10-CM | POA: Insufficient documentation

## 2020-10-14 DIAGNOSIS — C3492 Malignant neoplasm of unspecified part of left bronchus or lung: Secondary | ICD-10-CM | POA: Insufficient documentation

## 2020-10-17 ENCOUNTER — Telehealth: Payer: Medicare Other

## 2020-10-17 ENCOUNTER — Telehealth: Payer: Self-pay

## 2020-10-17 NOTE — Telephone Encounter (Signed)
  Chronic Care Management   Outreach Note  10/17/2020 Name: William Mcguire MRN: [id number] DOB: Jul 05, 1955  Referred by: [name], MD Reason for referral : No chief complaint on file.   An unsuccessful telephone outreach was attempted today. The patient was referred to the case management team for assistance with care management and care coordination.   Follow Up Plan: A HIPAA compliant phone message was left for the patient providing contact information and requesting a return call. Telephone follow up appointment with care management team member scheduled for: 12/06/20   Barb Merino, RN, BSN, CCM Care Management Coordinator [place] [facility]  Direct Phone: [phone]

## 2020-11-07 ENCOUNTER — Telehealth: Payer: Self-pay

## 2020-11-07 NOTE — Chronic Care Management (AMB) (Signed)
Chronic Care Management Pharmacy Assistant   Name: William Mcguire  MRN: [id number] DOB: 07-Mar-1956   Reason for Encounter: Disease State/ Hypertension, Diabetes    Recent office visits:  07-11-2020 [name], MD. Abnormal CT scan.  08-06-2020 [name], MD.  09-05-2020 Rex Kras Claudette Stapler., RN (CCM)  10-09-2020 [name], MD.  Recent consult visits:  06-13-2020 Vicie Mutters, [facility])   07-16-2020 Vicie Mutters, [place] [facility]   [facility] visits:  None in previous 6 months  Medications: Outpatient Encounter Medications as of 11/07/2020  Medication Sig  . amLODipine-atorvastatin (CADUET) 10-40 MG tablet TAKE 1 TABLET BY MOUTH EVERY DAY  . aspirin EC 81 MG tablet Take 81 mg by mouth daily.  . cyanocobalamin (,VITAMIN B-12,) 1000 MCG/ML injection INJECT 1 ML INTRAMUSCULARLY WEEKLY FOR 3 WEEKS, THEN USE ONCE MONTHLY (Patient taking differently: 1,000 mcg every 30 (thirty) days.)  . Dulaglutide 1.5 MG/0.5ML SOPN Inject 1.5 mg into the skin once a week.   . enalapril (VASOTEC) 10 MG tablet Take 1 tablet (10 mg total) by mouth daily.  [name] glucose blood test strip Use as instructed to test blood sugar 3 times a day. Dx code: e11.65  . Insulin Glargine ([name] KWIKPEN) 100 UNIT/ML Inject 0.22 mLs (22 Units total) into the skin at bedtime.  . Insulin Pen Needle (B-D ULTRAFINE III SHORT PEN) 31G X 8 MM MISC USE AS DIRECTED  . metFORMIN (GLUCOPHAGE) 1000 MG tablet TAKE 1 TABLET BY MOUTH TWICE A DAY WITH A MEAL  . sildenafil (VIAGRA) 100 MG tablet TAKE 1 TABLET BY MOUTH EVERY DAY AS NEEDED  . SYRINGE-NEEDLE, DISP, 3 ML (B-D 3CC LUER-LOK SYR 25GX5/8") 25G X 5/8" 3 ML MISC USE AS DIRECTED   No facility-administered encounter medications on file as of 11/07/2020.   Reviewed chart prior to disease state call. Spoke with patient regarding BP  Recent Office Vitals: BP Readings from Last 3 Encounters:  10/09/20 (!) 144/72   08/06/20 138/88  07/11/20 (!) 146/82   Pulse Readings from Last 3 Encounters:  10/09/20 69  08/06/20 (!) 59  07/11/20 71    Wt Readings from Last 3 Encounters:  10/09/20 247 lb 9.6 oz (112.3 kg)  08/06/20 247 lb 12.8 oz (112.4 kg)  07/11/20 242 lb (109.8 kg)     Kidney Function Lab Results  Component Value Date/Time   CREATININE 1.25 10/09/2020 11:41 AM   CREATININE 1.23 08/06/2020 10:44 AM   GFRNONAA 61 08/06/2020 10:44 AM   GFRAA 70 08/06/2020 10:44 AM    BMP Latest Ref Rng & Units 10/09/2020 08/06/2020 04/03/2020  Glucose 65 - 99 mg/dL 91 92 78  BUN 8 - 27 mg/dL 13 13 15   Creatinine 0.76 - 1.27 mg/dL 1.25 1.23 1.29(H)  BUN/Creat Ratio 10 - 24 10 11 12   Sodium 134 - 144 mmol/L 141 139 139  Potassium 3.5 - 5.2 mmol/L 4.7 4.8 4.6  Chloride 96 - 106 mmol/L 102 103 102  CO2 20 - 29 mmol/L 21 23 26   Calcium 8.6 - 10.2 mg/dL 9.7 9.7 9.8    . Current antihypertensive regimen:   Caduet 10-40 mg daily  Enalapril 10 mg daily  How often are you checking your Blood Pressure? Patient states he tries to check blood pressure at least once weekly.  . Current home BP readings: 130/80  . What recent interventions/DTPs have been made by any provider to improve Blood Pressure control since last CPP Visit: Patient states he is taking  medications as directed.  . Any recent hospitalizations or ED visits since last visit with CPP? No   . What diet changes have been made to improve Blood Pressure Control?  o Patient states he is limiting salt intake, eating healthier and drinking drinking plenty of water.  . What exercise is being done to improve your Blood Pressure Control?  o Patient states he is very active during the day with mowing the lawn and doing maintenance work.   Adherence Review: Is the patient currently on ACE/ARB medication? Yes Does the patient have >5 day gap between last estimated fill dates? No  Recent Relevant Labs: Lab Results  Component Value Date/Time    HGBA1C 6.3 (H) 10/09/2020 11:41 AM   HGBA1C 6.7 (H) 07/11/2020 04:43 PM   MICROALBUR 30 11/30/2019 09:55 AM   MICROALBUR 80 03/17/2019 09:39 AM    Kidney Function Lab Results  Component Value Date/Time   CREATININE 1.25 10/09/2020 11:41 AM   CREATININE 1.23 08/06/2020 10:44 AM   GFRNONAA 61 08/06/2020 10:44 AM   GFRAA 70 08/06/2020 10:44 AM    . Current antihyperglycemic regimen:   Trulicity 1.5 mg weekly  [name] 22 units at bedtime  Metformin 1000 mg twice daily with a meal  . What recent interventions/DTPs have been made to improve glycemic control:   Patient is continuing to follow healthy diet,and is taking medications as directed  . Have there been any recent hospitalizations or ED visits since last visit with CPP? No   . Patient denies hypoglycemic symptoms  . Patient denies hyperglycemic symptoms  . How often are you checking your blood sugar? once daily   . What are your blood sugars ranging?  o Fasting: 98, 119, 130, 120 o Before meals: None o After meals: None o Bedtime: None . During the week, how often does your blood glucose drop below 70? Never   . Are you checking your feet daily/regularly? Patient states daily  Adherence Review: Is the patient currently on a STATIN medication? Yes Is the patient currently on ACE/ARB medication? Yes Does the patient have >5 day gap between last estimated fill dates? Yes  NOTES: Patient states he is feeling great and has no concerns. Sent scheduling a message to schedule follow up with [name] [facility] on 01-02-21 at 10:30.   Star Rating Drugs: Metformin 1000 mg- [facility]18[facility]  Caduet 10-40 mg- Last filled [facility]-25-[facility] Enalapril 10 mg- Last filled [id number] [facility] Trulicity 1.5 mg- Patient assistance  [place]  [phone]

## 2020-11-15 ENCOUNTER — Other Ambulatory Visit: Payer: Medicare Other

## 2020-11-17 ENCOUNTER — Other Ambulatory Visit: Payer: Medicare Other

## 2020-11-20 ENCOUNTER — Other Ambulatory Visit: Payer: Medicare Other

## 2020-11-22 ENCOUNTER — Ambulatory Visit
Admission: RE | Admit: 2020-11-22 | Discharge: 2020-11-22 | Disposition: A | Discharge status: Home / Self Care | Payer: Medicare Other | Source: Ambulatory Visit | Attending: Internal Medicine | Admitting: Internal Medicine

## 2020-11-22 ENCOUNTER — Other Ambulatory Visit: Payer: Self-pay

## 2020-11-22 DIAGNOSIS — R918 Other nonspecific abnormal finding of lung field: Secondary | ICD-10-CM | POA: Diagnosis not present

## 2020-12-04 ENCOUNTER — Ambulatory Visit: Payer: Medicare Other | Admitting: Internal Medicine

## 2020-12-05 ENCOUNTER — Telehealth: Payer: Self-pay

## 2020-12-05 ENCOUNTER — Ambulatory Visit: Payer: Medicare Other

## 2020-12-05 ENCOUNTER — Encounter: Payer: Medicare Other | Admitting: Internal Medicine

## 2020-12-05 NOTE — Chronic Care Management (AMB) (Addendum)
Chronic Care Management Pharmacy Assistant   Name: William Mcguire  MRN: [id number] DOB: [birth year]   Reason for Encounter: Disease State/ Hypertension  Recent office visits:  None  Recent consult visits:  None  Hospital visits:  None in previous 6 months  Medications: Outpatient Encounter Medications as of 12/05/2020  Medication Sig   amLODipine-atorvastatin (CADUET) 10-40 MG tablet TAKE 1 TABLET BY MOUTH EVERY DAY   aspirin EC 81 MG tablet Take 81 mg by mouth daily.   cyanocobalamin (,VITAMIN B-12,) 1000 MCG/ML injection INJECT 1 ML INTRAMUSCULARLY WEEKLY FOR 3 WEEKS, THEN USE ONCE MONTHLY (Patient taking differently: 1,000 mcg every 30 (thirty) days.)   Dulaglutide 1.5 MG/0.5ML SOPN Inject 1.5 mg into the skin once a week.    enalapril (VASOTEC) 10 MG tablet Take 1 tablet (10 mg total) by mouth daily.   glucose blood test strip Use as instructed to test blood sugar 3 times a day. 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   Insulin Pen Needle (B-D ULTRAFINE III SHORT PEN) 31G X 8 MM MISC USE AS DIRECTED   metFORMIN (GLUCOPHAGE) 1000 MG tablet TAKE 1 TABLET BY MOUTH TWICE A DAY WITH A MEAL   sildenafil (VIAGRA) 100 MG tablet TAKE 1 TABLET BY MOUTH EVERY DAY AS NEEDED   SYRINGE-NEEDLE, DISP, 3 ML (B-D 3CC LUER-LOK SYR 25GX5/8") 25G X 5/8" 3 ML MISC USE AS DIRECTED   No facility-administered encounter medications on file as of 12/05/2020.   Reviewed chart prior to disease state call. Spoke with patient regarding BP  Recent Office Vitals: BP Readings from Last 3 Encounters:  10/09/20 (!) 144/72  08/06/20 138/88  07/11/20 (!) 146/82   Pulse Readings from Last 3 Encounters:  10/09/20 69  08/06/20 (!) 59  07/11/20 71    Wt Readings from Last 3 Encounters:  10/09/20 247 lb 9.6 oz (112.3 kg)  08/06/20 247 lb 12.8 oz (112.4 kg)  07/11/20 242 lb (109.8 kg)     Kidney Function Lab Results   Component Value Date/Time   CREATININE 1.25 10/09/2020 11:41 AM   CREATININE 1.23 08/06/2020 10:44 AM   GFRNONAA 61 08/06/2020 10:44 AM   GFRAA 70 08/06/2020 10:44 AM    BMP Latest Ref Rng & Units 10/09/2020 08/06/2020 04/03/2020  Glucose 65 - 99 mg/dL 91 92 78  BUN 8 - 27 mg/dL 13 13 15   Creatinine 0.76 - 1.27 mg/dL 1.25 1.23 1.29(H)  BUN/Creat Ratio 10 - 24 10 11 12   Sodium 134 - 144 mmol/L 141 139 139  Potassium 3.5 - 5.2 mmol/L 4.7 4.8 4.6  Chloride 96 - 106 mmol/L 102 103 102  CO2 20 - 29 mmol/L 21 23 26   Calcium 8.6 - 10.2 mg/dL 9.7 9.7 9.8    Current antihypertensive regimen:  Caduet 10-40 mg daily Enalapril 10 mg daily  How often are you checking your Blood Pressure? 3-5x per week  Current home BP readings: 130/80  What recent interventions/DTPs have been made by any provider to improve Blood Pressure control since last CPP Visit: Patient states he is taking medications as directed.  Any recent hospitalizations or ED visits since last visit with CPP? No  What diet changes have been made to improve Blood Pressure Control?  Patient states he hasn't made any changes to his diet. Patient states he never cooked with salt but does eat plenty of vegetables and fruits  What exercise is  being done to improve your Blood Pressure Control?  Patient states he is constantly active with cleaning his yard and running errands   Adherence Review: Is the patient currently on ACE/ARB medication? Yes Does the patient have >5 day gap between last estimated fill dates? No  NOTES: Patient is aware of his future appointment with [name] CPP on 01-02-2021  12-20-2020: Called patient to follow up on trulicity. Patient stated he is almost out. Called [employer] and was notified that medication was delivered today. Informed patient.  Star Rating Drugs: Metformin 1000 mg- [facility] (patient isn't taking) Caduet 10-40 mg- [facility] Enalapril 10 mg- Last filled [id number] [facility] Trulicity 1.5 mg- Patient assistance  [place] Pharmacist Assistant [phone]

## 2020-12-05 NOTE — Telephone Encounter (Signed)
This nurse attempted to call patient in regards to missing today's AWV. Message left to call back in order to reschedule.

## 2020-12-06 ENCOUNTER — Telehealth: Payer: Medicare Other

## 2020-12-06 ENCOUNTER — Telehealth: Payer: Self-pay

## 2020-12-06 NOTE — Telephone Encounter (Signed)
  Care Management   Follow Up Note   12/06/2020 Name: William Mcguire MRN: [id number] DOB: [birth year]   Referred by: [name], MD Reason for referral : Chronic Care Management (RNCM follow up call - 2nd attempt )   A second unsuccessful telephone outreach was attempted today. The patient was referred to the case management team for assistance with care management and care coordination.   Follow Up Plan: A HIPPA compliant phone message was left for the patient providing contact information and requesting a return call.   Barb Merino, RN, BSN, CCM Care Management Coordinator [place] [facility]  Direct Phone: [phone]

## 2020-12-18 ENCOUNTER — Telehealth: Payer: Self-pay | Admitting: Internal Medicine

## 2020-12-18 NOTE — Telephone Encounter (Signed)
Correction scheduled with [facility] not [facility]

## 2020-12-18 NOTE — Telephone Encounter (Signed)
Left message for patient to call back and schedule Medicare Annual Wellness Visit (AWV) either virtually or in office.   Last WTM 11/30/19 please schedule at anytime with [facility] Nurse Health Advisor 1 or 2   This should be a 45 minute visit.

## 2020-12-22 ENCOUNTER — Other Ambulatory Visit: Payer: Self-pay | Admitting: Internal Medicine

## 2021-01-01 ENCOUNTER — Telehealth: Payer: Self-pay

## 2021-01-01 NOTE — Chronic Care Management (AMB) (Signed)
   No answer, left message of telephone appointment with [name] CPP on 01-02-2021 at 10:30 . Left message to have all medications, supplements, blood pressure and/or blood sugar logs available during appointment and to return call if need to reschedule.   [place] Pharmacist Assistant [phone]

## 2021-01-02 ENCOUNTER — Telehealth: Payer: Medicare Other

## 2021-01-02 NOTE — Progress Notes (Deleted)
Chronic Care Management Pharmacy Note  01/02/2021 Name:  William Mcguire MRN:  [id number] DOB:  10-17-1955  Summary: ***  Recommendations/Changes made from today's visit: ***  Plan: ***   Subjective: William Mcguire is an 65 y.o. year old male who is a primary patient of [name], MD.  The CCM team was consulted for assistance with disease management and care coordination needs.    {CCMTELEPHONEFACETOFACE:21091510} for {CCMINITIALFOLLOWUPCHOICE:21091511} in response to provider referral for pharmacy case management and/or care coordination services.   Consent to Services:  {CCMCONSENTOPTIONS:25074}  Patient Care Team: [name], MD as PCP - General (Internal Medicine) Rex Kras, Claudette Stapler, RN as Case Manager [place], Kennieth Francois, [facility] (Inactive) (Pharmacist)  Recent office visits: ***  Recent consult visits: [facility] visits: {Hospital DC Yes/No:25215}   Objective:  Lab Results  Component Value Date   CREATININE 1.25 10/09/2020   BUN 13 10/09/2020   GFRNONAA 61 08/06/2020   GFRAA 70 08/06/2020   NA 141 10/09/2020   K 4.7 10/09/2020   CALCIUM 9.7 10/09/2020   CO2 21 10/09/2020   GLUCOSE 91 10/09/2020    Lab Results  Component Value Date/Time   HGBA1C 6.3 (H) 10/09/2020 11:41 AM   HGBA1C 6.7 (H) 07/11/2020 04:43 PM   MICROALBUR 30 11/30/2019 09:55 AM   MICROALBUR 80 03/17/2019 09:39 AM    Last diabetic Eye exam:  Lab Results  Component Value Date/Time   HMDIABEYEEXA Retinopathy (A) 10/11/2020 12:00 AM    Last diabetic Foot exam: No results found for: HMDIABFOOTEX   Lab Results  Component Value Date   CHOL 168 11/30/2019   HDL 51 11/30/2019   LDLCALC 104 (H) 11/30/2019   TRIG 70 11/30/2019   CHOLHDL 3.3 11/30/2019    Hepatic Function Latest Ref Rng & Units 11/30/2019 05/30/2019 11/24/2018  Total Protein 6.0 - 8.5 g/dL 7.8 7.8 8.0  Albumin 3.8 - 4.8 g/dL 4.5 4.4 4.8  AST 0 - 40 IU/L 22 28 30   ALT 0 - 44 IU/L 11 20 25   Alk Phosphatase  48 - 121 IU/L 116 111 117  Total Bilirubin 0.0 - 1.2 mg/dL 0.5 0.3 0.5    Lab Results  Component Value Date/Time   TSH 2.330 08/06/2020 11:12 AM   FREET4 1.07 08/06/2020 11:12 AM    CBC Latest Ref Rng & Units 08/06/2020 11/30/2019 11/24/2018  WBC 3.4 - 10.8 x10E3/uL 5.2 5.4 7.0  Hemoglobin 13.0 - 17.7 g/dL 12.2(L) 12.8(L) 13.3  Hematocrit 37.5 - 51.0 % 37.2(L) 38.9 40.8  Platelets 150 - 450 x10E3/uL 236 245 232    No results found for: VD25OH  Clinical ASCVD: {YES/NO:21197} The 10-year ASCVD risk score Mikey Bussing DC Jr., et al., 2013) is: 33.6%   Values used to calculate the score:     Age: 65 years     Sex: Male     Is Non-Hispanic African American: Yes     Diabetic: Yes     Tobacco smoker: No     Systolic Blood Pressure: 245 mmHg     Is BP treated: Yes     HDL Cholesterol: 51 mg/dL     Total Cholesterol: 168 mg/dL    Depression screen [facility] 2/9 11/30/2019 04/25/2019 03/17/2019  Decreased Interest 0 0 0  Down, Depressed, Hopeless 0 0 0  PHQ - 2 Score 0 0 0     ***Other: (CHADS2VASc if Afib, MMRC or CAT for COPD, ACT, DEXA)  Social History   Tobacco Use  Smoking Status Never  Smokeless Tobacco  Never  Tobacco Comments   n/a   BP Readings from Last 3 Encounters:  10/09/20 (!) 144/72  08/06/20 138/88  07/11/20 (!) 146/82   Pulse Readings from Last 3 Encounters:  10/09/20 69  08/06/20 (!) 59  07/11/20 71   Wt Readings from Last 3 Encounters:  10/09/20 247 lb 9.6 oz (112.3 kg)  08/06/20 247 lb 12.8 oz (112.4 kg)  07/11/20 242 lb (109.8 kg)   BMI Readings from Last 3 Encounters:  10/09/20 33.39 kg/m  08/06/20 33.42 kg/m  07/11/20 32.64 kg/m    Assessment/Interventions: Review of patient past medical history, allergies, medications, health status, including review of consultants reports, laboratory and other test data, was performed as part of comprehensive evaluation and provision of chronic care management services.   SDOH:  (Social Determinants of Health)  assessments and interventions performed: {yes/no:20286}  SDOH Screenings   Alcohol Screen: Not on file  Depression (PHQ2-9): Not on file  Financial Resource Strain: Medium Risk   Difficulty of Paying Living Expenses: Somewhat hard  Food Insecurity: No Food Insecurity   Worried About Running Out of Food in the Last Year: Never true   Ran Out of Food in the Last Year: Never true  Housing: Low Risk    Last Housing Risk Score: 0  Physical Activity: Not on file  Social Connections: Not on file  Stress: Not on file  Tobacco Use: Low Risk    Smoking Tobacco Use: Never   Smokeless Tobacco Use: Never  Transportation Needs: No Transportation Needs   Lack of Transportation (Medical): No   Lack of Transportation (Non-Medical): No    CCM Care Plan  No Known Allergies  Medications Reviewed Today     Reviewed by [name], MD (Physician) on 10/09/20 at 1120  Med List Status: <None>   Medication Order Taking? Sig Documenting Provider Last Dose Status Informant  amLODipine-atorvastatin (CADUET) 10-40 MG tablet [id number] Yes TAKE 1 TABLET BY MOUTH EVERY DAY [name], MD Taking Active   aspirin EC 81 MG tablet [id number] Yes Take 81 mg by mouth daily. [provider] Taking Active Multiple Informants  cyanocobalamin (,VITAMIN B-12,) 1000 MCG/ML injection [id number] Yes INJECT 1 ML INTRAMUSCULARLY WEEKLY FOR 3 WEEKS, THEN USE ONCE MONTHLY  Patient taking differently: 1,000 mcg every 30 (thirty) days.   [name], MD Taking Active   Dulaglutide 1.5 MG/0.5ML [place] [id number] Yes Inject 1.5 mg into the skin once a week.  [provider] Taking Active   enalapril (VASOTEC) 10 MG tablet [id number] Yes Take 1 tablet (10 mg total) by mouth daily. [name], MD Taking Active   glucose blood test strip [id number] Yes Use as instructed to test blood sugar 3 times a day. Dx code: e11.65 [name], MD Taking Active   Insulin Glargine [facility]) 100 UNIT/ML  [id number] Yes Inject 0.22 mLs (22 Units total) into the skin at bedtime. [name], MD Taking Active   Insulin Pen Needle (B-D ULTRAFINE III SHORT PEN) 31G X 8 MM MISC [id number] Yes USE AS DIRECTED [name], MD Taking Active   metFORMIN (GLUCOPHAGE) 1000 MG tablet [id number] Yes TAKE 1 TABLET BY MOUTH TWICE A DAY WITH A MEAL [name], MD Taking Active   sildenafil (VIAGRA) 100 MG tablet [id number] Yes TAKE 1 TABLET BY MOUTH EVERY DAY AS NEEDED [name], MD Taking Active   SYRINGE-NEEDLE, DISP, 3 ML (B-D 3CC LUER-LOK SYR 25GX5/8") 25G X 5/8" 3 ML [place] [id number] Yes USE AS DIRECTED [name], MD Taking  Active             Patient Active Problem List   Diagnosis Date Noted   Abnormal CT scan of lung 10/14/2020   Hypertensive nephropathy 08/05/2018   Class 1 obesity due to excess calories with serious comorbidity and body mass index (BMI) of 32.0 to 32.9 in adult 08/05/2018   Pyelonephritis 06/23/2011   AKI (acute kidney injury) ([place]) 06/23/2011   Diabetes ([place]) 06/23/2011   HTN (hypertension) 06/23/2011    Immunization History  Administered Date(s) Administered   Fluad Quad(high Dose 65+) 04/03/2020   Influenza Split 06/12/2011   Influenza, High Dose Seasonal PF 03/22/2019   Influenza,inj,Quad PF,6+ Mos 05/05/2018   Influenza-Unspecified 03/22/2019   Moderna Sars-Covid-2 Vaccination 09/10/2019, 10/08/2019, 05/01/2020   Pneumococcal Polysaccharide-23 04/25/2019    Conditions to be addressed/monitored:  {USCCMDZASSESSMENTOPTIONS:23563}  There are no care plans that you recently modified to display for this patient.    Medication Assistance: {MEDASSISTANCEINFO:25044}  Compliance/Adherence/Medication fill history: Care Gaps: ***  Star-Rating Drugs: Enalapril 10 mg  Dulaglutide 1.5 mg Atorvastatin 40 mg  Metformin 1000 mg   Patient's preferred pharmacy is:  [id number] [place]Phone: [phone]: [phone] [facility] [address]Phone: [phone]: [phone] Uses pill box? {Yes or If no, why not?:20788} Pt endorses ***% compliance  We discussed: {Pharmacy options:24294} Patient decided to: {US Pharmacy Plan:23885}  Care Plan and Follow Up Patient Decision:  {FOLLOWUP:24991}  Plan: {CM FOLLOW UP PTDSK:87681} ***    Current Barriers:  {pharmacybarriers:24917}  Pharmacist Clinical Goal(s):  Patient will {PHARMACYGOALCHOICES:24921} through collaboration with PharmD and provider.   Interventions: 1:1 collaboration with SGlendale Chard MD regarding development and update of comprehensive plan of care as evidenced by provider attestation and co-signature Inter-disciplinary care team collaboration (see longitudinal plan of care) Comprehensive medication review performed; medication list updated in electronic medical record  {CCM PHARMD DISEASE STATES:25130}  Patient Goals/Self-Care Activities Patient will:  - {pharmacypatientgoals:24919}  Follow Up Plan: {CM FOLLOW UP PLXBW:62035}

## 2021-01-09 ENCOUNTER — Telehealth: Payer: Self-pay | Admitting: Internal Medicine

## 2021-01-09 NOTE — Telephone Encounter (Signed)
Left message for patient to call back and schedule Medicare Annual Wellness Visit (AWV) either virtually or in office.   Last WTM  ;11/30/19   NEEDS AWVI please schedule at anytime with [facility]    This should be a 45 minute visit.

## 2021-01-13 IMAGING — CR DG ABDOMEN 1V
1 series · 1 of 1 positions shown · non-contrast
Comparison: None.

CLINICAL DATA: 64-year-old male with constipation and diarrhea

EXAM:
ABDOMEN - 1 VIEW

[t abdomen supine]
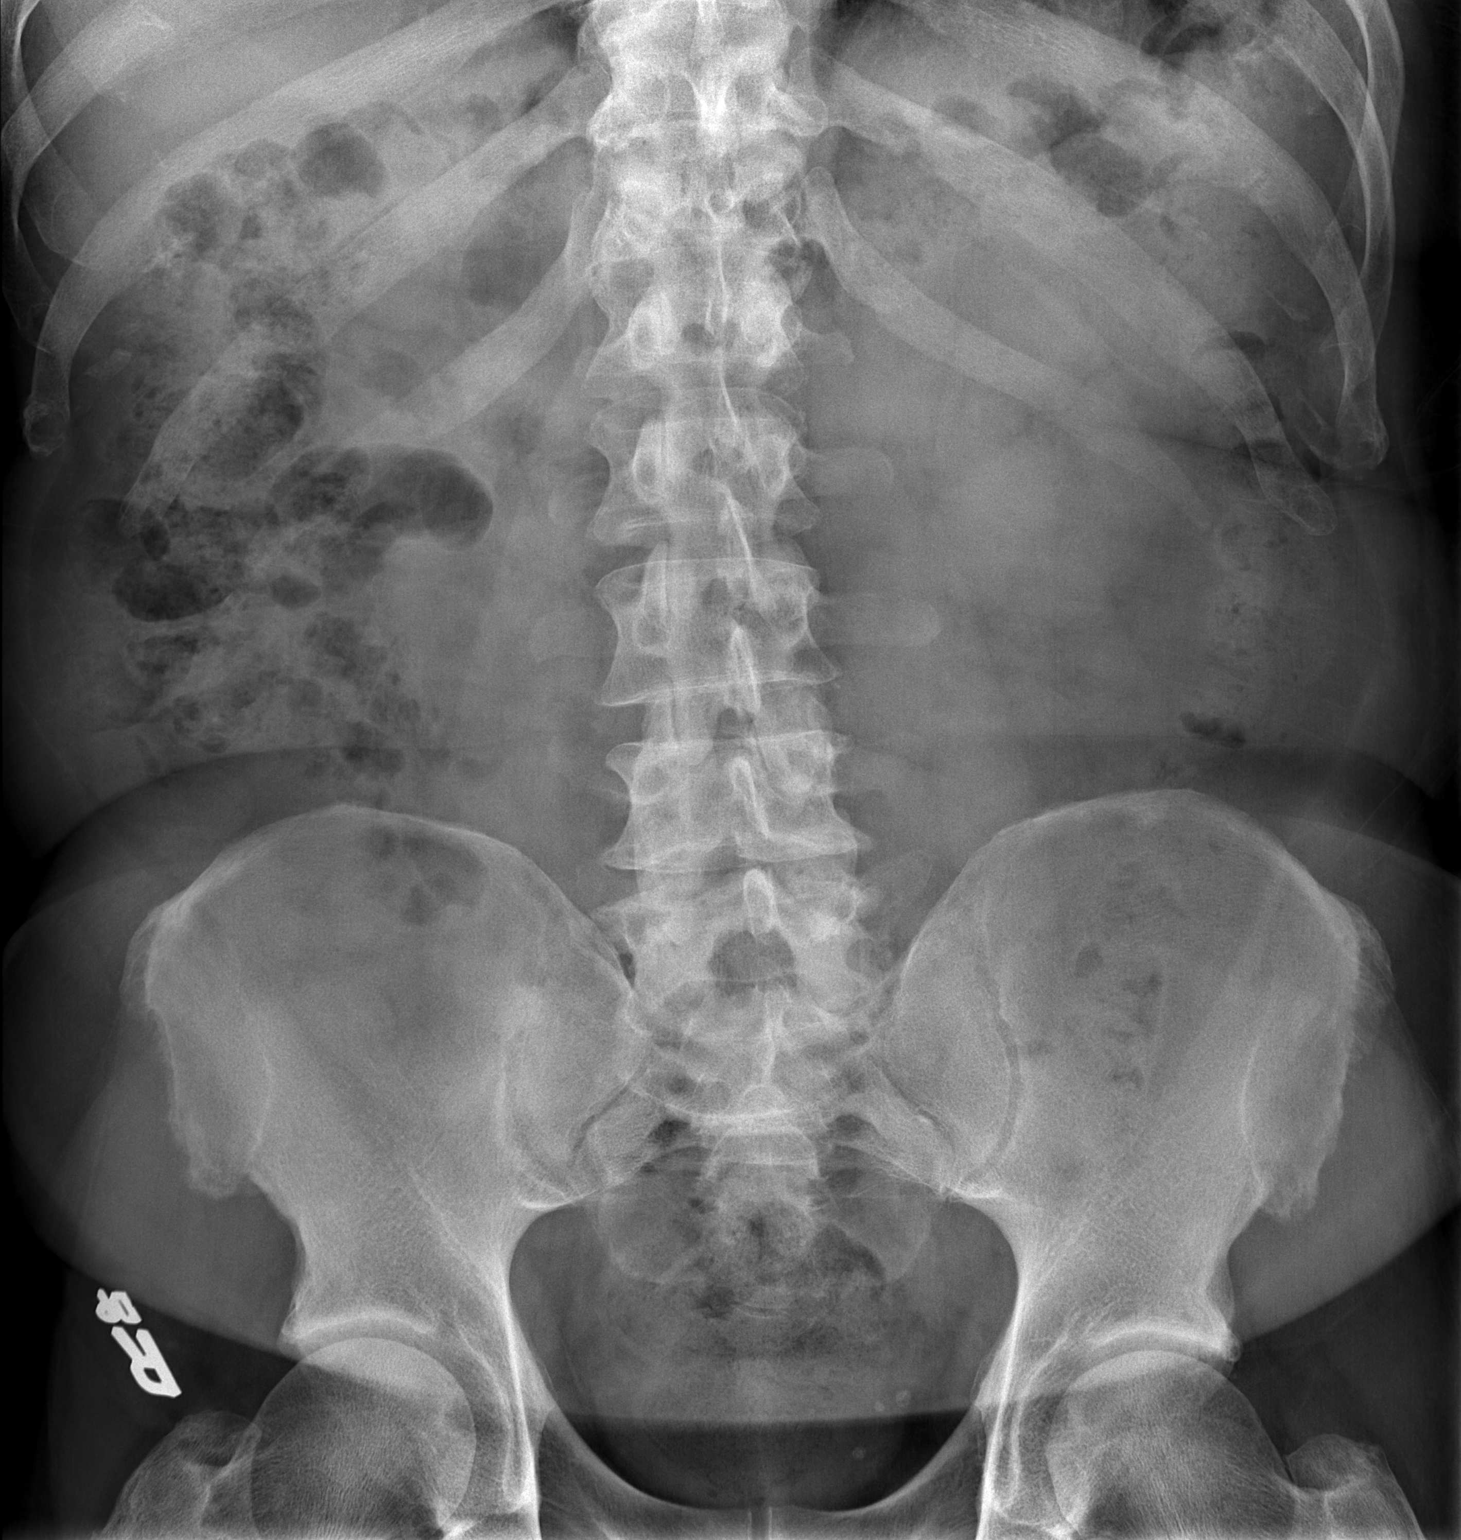

[1 of 1 positions shown; findings below may reference images not displayed]

FINDINGS: Gas within small bowel and colon. No abnormal distension. Minimal
stool burden within the right colon and left colon. No air-fluid
levels.

No unexpected radiopaque foreign body. No unexpected soft tissue
density. No unexpected calcifications. No displaced fracture
IMPRESSION: Nonobstructive bowel gas pattern with minimal stool burden.

## 2021-01-21 ENCOUNTER — Other Ambulatory Visit: Payer: Self-pay

## 2021-01-21 ENCOUNTER — Encounter: Payer: Self-pay | Admitting: Internal Medicine

## 2021-01-21 ENCOUNTER — Ambulatory Visit (INDEPENDENT_AMBULATORY_CARE_PROVIDER_SITE_OTHER): Payer: Medicare Other | Admitting: Internal Medicine

## 2021-01-21 VITALS — BP 138/80 | HR 63 | Temp 98.0°F | Ht 72.2 in | Wt 250.6 lb

## 2021-01-21 DIAGNOSIS — E1122 Type 2 diabetes mellitus with diabetic chronic kidney disease: Secondary | ICD-10-CM

## 2021-01-21 DIAGNOSIS — N182 Chronic kidney disease, stage 2 (mild): Secondary | ICD-10-CM

## 2021-01-21 DIAGNOSIS — D513 Other dietary vitamin B12 deficiency anemia: Secondary | ICD-10-CM | POA: Diagnosis not present

## 2021-01-21 DIAGNOSIS — R351 Nocturia: Secondary | ICD-10-CM | POA: Diagnosis not present

## 2021-01-21 DIAGNOSIS — I129 Hypertensive chronic kidney disease with stage 1 through stage 4 chronic kidney disease, or unspecified chronic kidney disease: Secondary | ICD-10-CM | POA: Diagnosis not present

## 2021-01-21 DIAGNOSIS — Z6833 Body mass index (BMI) 33.0-33.9, adult: Secondary | ICD-10-CM

## 2021-01-21 DIAGNOSIS — Z794 Long term (current) use of insulin: Secondary | ICD-10-CM

## 2021-01-21 DIAGNOSIS — Z0000 Encounter for general adult medical examination without abnormal findings: Secondary | ICD-10-CM

## 2021-01-21 DIAGNOSIS — E6609 Other obesity due to excess calories: Secondary | ICD-10-CM | POA: Insufficient documentation

## 2021-01-21 DIAGNOSIS — D649 Anemia, unspecified: Secondary | ICD-10-CM | POA: Diagnosis not present

## 2021-01-21 DIAGNOSIS — D519 Vitamin B12 deficiency anemia, unspecified: Secondary | ICD-10-CM

## 2021-01-21 LAB — POCT UA - MICROALBUMIN
Albumin/Creatinine Ratio, Urine, POC: <30
Creatinine, POC: 200 mg/dL
Microalbumin Ur, POC: 30 mg/L

## 2021-01-21 LAB — POCT URINALYSIS DIPSTICK
Bilirubin, UA: NEGATIVE
Blood, UA: NEGATIVE
Glucose, UA: NEGATIVE
Ketones, UA: NEGATIVE
Leukocytes, UA: NEGATIVE
Nitrite, UA: NEGATIVE
Protein, UA: NEGATIVE
Spec Grav, UA: 1.015 (ref 1.010–1.025)
Urobilinogen, UA: 0.2 E.U./dL
pH, UA: 6 (ref 5.0–8.0)

## 2021-01-21 MED ORDER — CYANOCOBALAMIN 1000 MCG/ML IJ SOLN
1000.0000 ug | Freq: Once | INTRAMUSCULAR | Status: AC
  Administered 2021-01-21: 1000 ug via INTRAMUSCULAR

## 2021-01-21 MED ORDER — ZOSTER VAC RECOMB ADJUVANTED 50 MCG/0.5ML IM SUSR: 0.5000 mL | Freq: Once | INTRAMUSCULAR | 1 refills | Status: AC

## 2021-01-21 NOTE — Progress Notes (Signed)
I,Tianna Badgett,acting as a Education administrator for [name], MD.,have documented all relevant documentation on the behalf of [name], MD,as directed by  [name], MD while in the presence of [name], MD.  This visit occurred during the SARS-CoV-2 public health emergency.  Safety protocols were in place, including screening questions prior to the visit, additional usage of staff PPE, and extensive cleaning of exam room while observing appropriate contact time as indicated for disinfecting solutions.  Subjective:     Patient ID: William Mcguire , male    DOB: 09/15/1955 , 65 y.o.   MRN: [id number]   Chief Complaint  Patient presents with   Annual Exam   Diabetes   Hypertension    HPI  He is here today for physical exam. HE is followed by Urology for his prostate exams. He reports compliance with meds.   Diabetes He presents for his follow-up diabetic visit. He has type 2 diabetes mellitus. His disease course has been stable. There are no hypoglycemic associated symptoms. Pertinent negatives for diabetes include no blurred vision and no chest pain. There are no hypoglycemic complications. Diabetic complications include nephropathy. Risk factors for coronary artery disease include diabetes mellitus, dyslipidemia, hypertension, male sex and sedentary lifestyle. His breakfast blood glucose is taken between 8-9 am. His breakfast blood glucose range is generally 90-110 mg/dl.  Hypertension This is a chronic problem. The current episode started more than 1 year ago. The problem has been gradually improving since onset. The problem is controlled. Pertinent negatives include no blurred vision, chest pain, palpitations or shortness of breath.    Past Medical History:  Diagnosis Date   Diabetes mellitus    High cholesterol    Hypertension      Family History  Problem Relation Age of Onset   Hypertension Mother    Multiple myeloma Mother    Hypertension Father    Diabetes  Father      Current Outpatient Medications:    amLODipine-atorvastatin (CADUET) 10-40 MG tablet, TAKE 1 TABLET BY MOUTH EVERY DAY, Disp: 90 tablet, Rfl: 1   aspirin EC 81 MG tablet, Take 81 mg by mouth daily., Disp: , Rfl:    cyanocobalamin (,VITAMIN B-12,) 1000 MCG/ML injection, INJECT 1 ML INTRAMUSCULARLY WEEKLY FOR 3 WEEKS, THEN USE ONCE MONTHLY (Patient taking differently: 1,000 mcg every 30 (thirty) days.), Disp: 9 mL, Rfl: 3   Dulaglutide 1.5 MG/0.5ML SOPN, Inject 1.5 mg into the skin once a week. , Disp: , Rfl:    enalapril (VASOTEC) 10 MG tablet, TAKE 1 TABLET BY MOUTH EVERY DAY, Disp: 90 tablet, Rfl: 2   glucose blood test strip, Use as instructed to test blood sugar 3 times a day. Dx code: e11.65, Disp: 100 each, Rfl: 12   Insulin Glargine ([name] KWIKPEN) 100 UNIT/ML, Inject 0.22 mLs (22 Units total) into the skin at bedtime., Disp: 45 mL, Rfl: 3   Insulin Pen Needle (B-D ULTRAFINE III SHORT PEN) 31G X 8 MM MISC, USE AS DIRECTED, Disp: 100 each, Rfl: PRN   metFORMIN (GLUCOPHAGE) 1000 MG tablet, TAKE 1 TABLET BY MOUTH TWICE A DAY WITH A MEAL, Disp: 180 tablet, Rfl: 0   sildenafil (VIAGRA) 100 MG tablet, TAKE 1 TABLET BY MOUTH EVERY DAY AS NEEDED, Disp: 10 tablet, Rfl: 5   SYRINGE-NEEDLE, DISP, 3 ML (B-D 3CC LUER-LOK SYR 25GX5/8") 25G X 5/8" 3 ML MISC, USE AS DIRECTED, Disp: 12 each, Rfl: 24   No Known Allergies   Men's preventive visit. Patient  Health Questionnaire (PHQ-2) is  [place] Office Visit from 01/21/2021 in [facility]  PHQ-2 Total Score 0     . Patient is on a diabetic diet. Marital status: Married. Relevant history for alcohol use is:  Social History   Substance and Sexual Activity  Alcohol Use No  . Relevant history for tobacco use is:  Social History   Tobacco Use  Smoking Status Never  Smokeless Tobacco Never  Tobacco Comments   n/a  .   Review of Systems  Constitutional: Negative.   HENT: Negative.    Eyes: Negative.   Negative for blurred vision.  Respiratory: Negative.  Negative for shortness of breath.   Cardiovascular: Negative.  Negative for chest pain and palpitations.  Gastrointestinal: Negative.   Endocrine: Negative.   Genitourinary: Negative.   Musculoskeletal: Negative.   Skin: Negative.   Allergic/Immunologic: Negative.   Neurological: Negative.   Hematological: Negative.   Psychiatric/Behavioral: Negative.      Today's Vitals   01/21/21 0850  BP: 138/80  Pulse: 63  Temp: 98 F (36.7 C)  TempSrc: Oral  Weight: 250 lb 9.6 oz (113.7 kg)  Height: 6' 0.2" (1.834 m)   Body mass index is 33.8 kg/m.  Wt Readings from Last 3 Encounters:  01/21/21 250 lb 9.6 oz (113.7 kg)  10/09/20 247 lb 9.6 oz (112.3 kg)  08/06/20 247 lb 12.8 oz (112.4 kg)    Objective:  Physical Exam Vitals and nursing note reviewed.  Constitutional:      Appearance: Normal appearance.  HENT:     Head: Normocephalic and atraumatic.     Right Ear: Tympanic membrane, ear canal and external ear normal.     Left Ear: Tympanic membrane, ear canal and external ear normal.     Nose:     Comments: Masked     Mouth/Throat:     Comments: Masked  Eyes:     Extraocular Movements: Extraocular movements intact.     Conjunctiva/sclera: Conjunctivae normal.     Pupils: Pupils are equal, round, and reactive to light.  Cardiovascular:     Rate and Rhythm: Normal rate and regular rhythm.     Pulses: Normal pulses.          Dorsalis pedis pulses are 2+ on the right side and 2+ on the left side.     Heart sounds: Normal heart sounds.  Pulmonary:     Effort: Pulmonary effort is normal.     Breath sounds: Normal breath sounds.  Chest:  Breasts:    Right: Normal. No swelling, bleeding, inverted nipple, mass or nipple discharge.     Left: Normal. No swelling, bleeding, inverted nipple, mass or nipple discharge.  Abdominal:     General: Bowel sounds are normal.     Palpations: Abdomen is soft.  Genitourinary:     Comments: Deferred  Musculoskeletal:        General: Normal range of motion.     Cervical back: Normal range of motion and neck supple.  Feet:     Right foot:     Protective Sensation: 5 sites tested.  5 sites sensed.     Skin integrity: Callus and dry skin present.     Toenail Condition: Right toenails are normal.     Left foot:     Protective Sensation: 5 sites tested.  5 sites sensed.     Skin integrity: Callus and dry skin present.     Toenail Condition: Left toenails are normal.  Skin:  General: Skin is warm.  Neurological:     General: No focal deficit present.     Mental Status: He is alert.  Psychiatric:        Mood and Affect: Mood normal.        Behavior: Behavior normal.        Assessment And Plan:    1. Encounter for annual physical exam Comments: A full exam was performed. DRE deferred. PATIENT IS ADVISED TO GET 30-45 MINUTES REGULAR EXERCISE NO LESS THAN FOUR TO FIVE DAYS PER WEEK - BOTH WEIGHTBEARING EXERCISES AND AEROBIC ARE RECOMMENDED.  PATIENT IS ADVISED TO FOLLOW A HEALTHY DIET WITH AT LEAST SIX FRUITS/VEGGIES PER DAY, DECREASE INTAKE OF RED MEAT, AND TO INCREASE FISH INTAKE TO TWO DAYS PER WEEK.  MEATS/FISH SHOULD NOT BE FRIED, BAKED OR BROILED IS PREFERABLE.  IT IS ALSO IMPORTANT TO CUT BACK ON YOUR SUGAR INTAKE. PLEASE AVOID ANYTHING WITH ADDED SUGAR, CORN SYRUP OR OTHER SWEETENERS. IF YOU MUST USE A SWEETENER, YOU CAN TRY STEVIA. IT IS ALSO IMPORTANT TO AVOID ARTIFICIALLY SWEETENERS AND DIET BEVERAGES. LASTLY, I SUGGEST WEARING SPF 50 SUNSCREEN ON EXPOSED PARTS AND ESPECIALLY WHEN IN THE DIRECT SUNLIGHT FOR AN EXTENDED PERIOD OF TIME.  PLEASE AVOID FAST FOOD RESTAURANTS AND INCREASE YOUR WATER INTAKE.   2. Type 2 diabetes mellitus with stage 2 chronic kidney disease, with long-term current use of insulin (HCC) Comments: Diabetic foot exam was performed. He will RTO in 4 months for re-evaluation. I DISCUSSED WITH THE PATIENT AT LENGTH REGARDING THE GOALS OF  GLYCEMIC CONTROL AND POSSIBLE LONG-TERM COMPLICATIONS.  I  ALSO STRESSED THE IMPORTANCE OF COMPLIANCE WITH HOME GLUCOSE MONITORING, DIETARY RESTRICTIONS INCLUDING AVOIDANCE OF SUGARY DRINKS/PROCESSED FOODS,  ALONG WITH REGULAR EXERCISE.  I  ALSO STRESSED THE IMPORTANCE OF ANNUAL EYE EXAMS, SELF FOOT CARE AND COMPLIANCE WITH OFFICE VISITS.  - CBC - Hemoglobin A1c - CMP14+EGFR - Lipid panel  3. Hypertensive nephropathy Comments: Chronic, fair control. EKG performed, NSR w/o acute changes. He is encouraged to follow low sodium diet.He is aware goal BP is <130/80. NO med changes today.  - POCT Urinalysis Dipstick (81002) - POCT UA - Microalbumin - EKG 12-Lead - CMP14+EGFR  4. Other dietary vitamin B12 deficiency anemia Comments: I will check vitamin B12 IM x 1. He was also given vitamin B12 IM x 1.  - cyanocobalamin ((VITAMIN B-12)) injection 1,000 mcg - Vitamin B12  5. Nocturia Comments: I will check PSA today. I will forward his labs to [facility].  - PSA  6. Class 1 obesity due to excess calories with serious comorbidity and body mass index (BMI) of 33.0 to 33.9 in adult He is encouraged to initially strive for BMI less than 30 to decrease cardiac risk. He is advised to exercise no less than 150 minutes per week.   Patient was given opportunity to ask questions. Patient verbalized understanding of the plan and was able to repeat key elements of the plan. All questions were answered to their satisfaction.   I, [name], MD, have reviewed all documentation for this visit. The documentation on 01/21/21 for the exam, diagnosis, procedures, and orders are all accurate and complete.  THE PATIENT IS ENCOURAGED TO PRACTICE SOCIAL DISTANCING DUE TO THE COVID-19 PANDEMIC.

## 2021-01-21 NOTE — Patient Instructions (Signed)
Health Maintenance, Male Adopting a healthy lifestyle and getting preventive care are important in promoting health and wellness. Ask your health care provider about: The right schedule for you to have regular tests and exams. Things you can do on your own to prevent diseases and keep yourself healthy. What should I know about diet, weight, and exercise? Eat a healthy diet  Eat a diet that includes plenty of vegetables, fruits, low-fat dairy products, and lean protein. Do not eat a lot of foods that are high in solid fats, added sugars, or sodium.  Maintain a healthy weight Body mass index (BMI) is a measurement that can be used to identify possible weight problems. It estimates body fat based on height and weight. Your health care provider can help determine your BMI and help you achieve or maintain ahealthy weight. Get regular exercise Get regular exercise. This is one of the most important things you can do for your health. Most adults should: Exercise for at least 150 minutes each week. The exercise should increase your heart rate and make you sweat (moderate-intensity exercise). Do strengthening exercises at least twice a week. This is in addition to the moderate-intensity exercise. Spend less time sitting. Even light physical activity can be beneficial. Watch cholesterol and blood lipids Have your blood tested for lipids and cholesterol at 67 years of age, then havethis test every 5 years. You may need to have your cholesterol levels checked more often if: Your lipid or cholesterol levels are high. You are older than 67 years of age. You are at high risk for heart disease. What should I know about cancer screening? Many types of cancers can be detected early and may often be prevented. Depending on your health history and family history, you may need to have cancer screening at various ages. This may include screening for: Colorectal cancer. Prostate cancer. Skin cancer. Lung  cancer. What should I know about heart disease, diabetes, and high blood pressure? Blood pressure and heart disease High blood pressure causes heart disease and increases the risk of stroke. This is more likely to develop in people who have high blood pressure readings, are of African descent, or are overweight. Talk with your health care provider about your target blood pressure readings. Have your blood pressure checked: Every 3-5 years if you are 18-39 years of age. Every year if you are 40 years old or older. If you are between the ages of 65 and 75 and are a current or former smoker, ask your health care provider if you should have a one-time screening for abdominal aortic aneurysm (AAA). Diabetes Have regular diabetes screenings. This checks your fasting blood sugar level. Have the screening done: Once every three years after age 45 if you are at a normal weight and have a low risk for diabetes. More often and at a younger age if you are overweight or have a high risk for diabetes. What should I know about preventing infection? Hepatitis B If you have a higher risk for hepatitis B, you should be screened for this virus. Talk with your health care provider to find out if you are at risk forhepatitis B infection. Hepatitis C Blood testing is recommended for: Everyone born from 1945 through 1965. Anyone with known risk factors for hepatitis C. Sexually transmitted infections (STIs) You should be screened each year for STIs, including gonorrhea and chlamydia, if: You are sexually active and are younger than 67 years of age. You are older than 67 years of age   and your health care provider tells you that you are at risk for this type of infection. Your sexual activity has changed since you were last screened, and you are at increased risk for chlamydia or gonorrhea. Ask your health care provider if you are at risk. Ask your health care provider about whether you are at high risk for HIV.  Your health care provider may recommend a prescription medicine to help prevent HIV infection. If you choose to take medicine to prevent HIV, you should first get tested for HIV. You should then be tested every 3 months for as long as you are taking the medicine. Follow these instructions at home: Lifestyle Do not use any products that contain nicotine or tobacco, such as cigarettes, e-cigarettes, and chewing tobacco. If you need help quitting, ask your health care provider. Do not use street drugs. Do not share needles. Ask your health care provider for help if you need support or information about quitting drugs. Alcohol use Do not drink alcohol if your health care provider tells you not to drink. If you drink alcohol: Limit how much you have to 0-2 drinks a day. Be aware of how much alcohol is in your drink. In the [place] one drink equals one 12 oz bottle of beer (355 mL), one 5 oz glass of wine (148 mL), or one 1 oz glass of hard liquor (44 mL). General instructions Schedule regular health, dental, and eye exams. Stay current with your vaccines. Tell your health care provider if: You often feel depressed. You have ever been abused or do not feel safe at home. Summary Adopting a healthy lifestyle and getting preventive care are important in promoting health and wellness. Follow your health care provider's instructions about healthy diet, exercising, and getting tested or screened for diseases. Follow your health care provider's instructions on monitoring your cholesterol and blood pressure. This information is not intended to replace advice given to you by your health care provider. Make sure you discuss any questions you have with your healthcare provider. Document Revised: 06/02/2018 Document Reviewed: 06/02/2018 Elsevier Patient Education  2022 Elsevier Inc.  

## 2021-01-22 LAB — CBC
Hematocrit: 37.3 % — ABNORMAL LOW (ref 37.5–51.0)
Hemoglobin: 12.3 g/dL — ABNORMAL LOW (ref 13.0–17.7)
MCH: 28 pg (ref 26.6–33.0)
MCHC: 33 g/dL (ref 31.5–35.7)
MCV: 85 fL (ref 79–97)
Platelets: 227 10*3/uL (ref 150–450)
RBC: 4.39 x10E6/uL (ref 4.14–5.80)
RDW: 13.1 % (ref 11.6–15.4)
WBC: 4.9 10*3/uL (ref 3.4–10.8)

## 2021-01-22 LAB — CMP14+EGFR
ALT: 17 IU/L (ref 0–44)
AST: 21 IU/L (ref 0–40)
Albumin/Globulin Ratio: 1.3 (ref 1.2–2.2)
Albumin: 4.1 g/dL (ref 3.8–4.8)
Alkaline Phosphatase: 117 IU/L (ref 44–121)
BUN/Creatinine Ratio: 8 — ABNORMAL LOW (ref 10–24)
BUN: 10 mg/dL (ref 8–27)
Bilirubin Total: 0.2 mg/dL (ref 0.0–1.2)
CO2: 25 mmol/L (ref 20–29)
Calcium: 9.5 mg/dL (ref 8.6–10.2)
Chloride: 104 mmol/L (ref 96–106)
Creatinine, Ser: 1.22 mg/dL (ref 0.76–1.27)
Globulin, Total: 3.2 g/dL (ref 1.5–4.5)
Glucose: 118 mg/dL — ABNORMAL HIGH (ref 65–99)
Potassium: 4.7 mmol/L (ref 3.5–5.2)
Sodium: 139 mmol/L (ref 134–144)
Total Protein: 7.3 g/dL (ref 6.0–8.5)
eGFR: 65 mL/min/{1.73_m2} (ref >59)

## 2021-01-22 LAB — LIPID PANEL
Chol/HDL Ratio: 2.8 ratio (ref 0.0–5.0)
Cholesterol, Total: 135 mg/dL (ref 100–199)
HDL: 49 mg/dL (ref >39)
LDL Chol Calc (NIH): 74 mg/dL (ref 0–99)
Triglycerides: 56 mg/dL (ref 0–149)
VLDL Cholesterol Cal: 12 mg/dL (ref 5–40)

## 2021-01-22 LAB — PSA: Prostate Specific Ag, Serum: 0.5 ng/mL (ref 0.0–4.0)

## 2021-01-22 LAB — VITAMIN B12: Vitamin B-12: 490 pg/mL (ref 232–1245)

## 2021-01-22 LAB — HEMOGLOBIN A1C
Est. average glucose Bld gHb Est-mCnc: 151 mg/dL
Hgb A1c MFr Bld: 6.9 % — ABNORMAL HIGH (ref 4.8–5.6)

## 2021-01-23 ENCOUNTER — Telehealth: Payer: Self-pay

## 2021-01-23 ENCOUNTER — Ambulatory Visit (INDEPENDENT_AMBULATORY_CARE_PROVIDER_SITE_OTHER): Payer: Medicare Other

## 2021-01-23 ENCOUNTER — Telehealth: Payer: Medicare Other

## 2021-01-23 DIAGNOSIS — Z794 Long term (current) use of insulin: Secondary | ICD-10-CM

## 2021-01-23 DIAGNOSIS — N182 Chronic kidney disease, stage 2 (mild): Secondary | ICD-10-CM | POA: Diagnosis not present

## 2021-01-23 DIAGNOSIS — I129 Hypertensive chronic kidney disease with stage 1 through stage 4 chronic kidney disease, or unspecified chronic kidney disease: Secondary | ICD-10-CM | POA: Diagnosis not present

## 2021-01-23 DIAGNOSIS — E1122 Type 2 diabetes mellitus with diabetic chronic kidney disease: Secondary | ICD-10-CM

## 2021-01-23 NOTE — Telephone Encounter (Signed)
  Care Management   Follow Up Note   01/23/2021 Name: William Mcguire MRN: [id number] DOB: 06/25/1955   Referred by: [name], MD Reason for referral : Chronic Care Management (RN CM Follow up call - 3rd attempt )   Third unsuccessful telephone outreach was attempted today. The patient was referred to the case management team for assistance with care management and care coordination. The patient's primary care provider has been notified of our unsuccessful attempts to make or maintain contact with the patient. The care management team is pleased to engage with this patient at any time in the future should he/she be interested in assistance from the care management team.   Follow Up Plan: A HIPPA compliant phone message was left for the patient providing contact information and requesting a return call.   Barb Merino, RN, BSN, CCM Care Management Coordinator [place] [facility]  Direct Phone: [phone]

## 2021-01-25 ENCOUNTER — Telehealth: Payer: Self-pay

## 2021-01-25 LAB — FERRITIN: Ferritin: 59 ng/mL (ref 30–400)

## 2021-01-25 LAB — IRON AND TIBC
Iron Saturation: 19 % (ref 15–55)
Iron: 51 ug/dL (ref 38–169)
Total Iron Binding Capacity: 264 ug/dL (ref 250–450)
UIBC: 213 ug/dL (ref 111–343)

## 2021-01-25 LAB — SPECIMEN STATUS REPORT

## 2021-01-25 NOTE — Telephone Encounter (Signed)
I left the pt a message I was calling to get the responses to the questions that was with his lab results in his MyChart.

## 2021-01-25 NOTE — Telephone Encounter (Signed)
-----   Message from [name], MD sent at 01/23/2021  7:45 AM EDT ----- Your blood count is low. I would like for you to pick up some stool cards. Have you noticed any blood in your stools?   Your hba1c is 6.9, up from last visit. Please continue with current meds. Be sure to incorporate walking for exercise into your weekly routine. Your liver/kidney function are stable. Your cholesterol looks great.   Prostate test is within normal limits. I will fax labs to [facility]. Your vitamin B12 level is normal, but lower than previous visit. Be sure to administer vit B12 injection every four weeks.   Please let me know if you have any questions.   RS

## 2021-01-31 NOTE — Chronic Care Management (AMB) (Signed)
Chronic Care Management   CCM RN Visit Note  01/23/2021 Name: William Mcguire MRN: [id number] DOB: 17-Nov-1955  Subjective: William Mcguire is a 65 y.o. year old male who is a primary care patient of William Chard, MD. The care management team was consulted for assistance with disease management and care coordination needs.    Engaged with patient by telephone for follow up visit in response to provider referral for case management and/or care coordination services.   Consent to Services:  The patient was given information about Chronic Care Management services, agreed to services, and gave verbal consent prior to initiation of services.  Please see initial visit note for detailed documentation.   Patient agreed to services and verbal consent obtained.   Assessment: Review of patient past medical history, allergies, medications, health status, including review of consultants reports, laboratory and other test data, was performed as part of comprehensive evaluation and provision of chronic care management services.   SDOH (Social Determinants of Health) assessments and interventions performed:    CCM Care Plan  No Known Allergies  Outpatient Encounter Medications as of 01/23/2021  Medication Sig   amLODipine-atorvastatin (CADUET) 10-40 MG tablet TAKE 1 TABLET BY MOUTH EVERY DAY   aspirin EC 81 MG tablet Take 81 mg by mouth daily.   cyanocobalamin (,VITAMIN B-12,) 1000 MCG/ML injection INJECT 1 ML INTRAMUSCULARLY WEEKLY FOR 3 WEEKS, THEN USE ONCE MONTHLY (Patient taking differently: 1,000 mcg every 30 (thirty) days.)   Dulaglutide 1.5 MG/0.5ML SOPN Inject 1.5 mg into the skin once a week.    enalapril (VASOTEC) 10 MG tablet TAKE 1 TABLET BY MOUTH EVERY DAY   glucose blood test strip Use as instructed to test blood sugar 3 times a day. 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   Insulin Pen Needle (B-D  ULTRAFINE III SHORT PEN) 31G X 8 MM MISC USE AS DIRECTED   metFORMIN (GLUCOPHAGE) 1000 MG tablet TAKE 1 TABLET BY MOUTH TWICE A DAY WITH A MEAL   sildenafil (VIAGRA) 100 MG tablet TAKE 1 TABLET BY MOUTH EVERY DAY AS NEEDED   SYRINGE-NEEDLE, DISP, 3 ML (B-D 3CC LUER-LOK SYR 25GX5/8") 25G X 5/8" 3 ML MISC USE AS DIRECTED   No facility-administered encounter medications on file as of 01/23/2021.    Patient Active Problem List   Diagnosis Date Noted   Class 1 obesity due to excess calories with serious comorbidity and body mass index (BMI) of 33.0 to 33.9 in adult 01/21/2021   Abnormal CT scan of lung 10/14/2020   Hypertensive nephropathy 08/05/2018   Class 1 obesity due to excess calories with serious comorbidity and body mass index (BMI) of 32.0 to 32.9 in adult 08/05/2018   Pyelonephritis 06/23/2011   AKI (acute kidney injury) ([place]) 06/23/2011   Diabetes ([place]) 06/23/2011   HTN (hypertension) 06/23/2011    Conditions to be addressed/monitored: DM II with stage 2 CKD, HTN  Care Plan : Diabetes Type 2 (Adult)  Updates made by Lynne Logan, RN since 01/23/2021 12:00 AM     Problem: Glycemic Management (Diabetes, Type 2)   Priority: High     Long-Range Goal: Glycemic Management Optimized   Start Date: 09/06/2020  Expected End Date: 09/06/2021  Recent Progress: On track  Priority: High  Note:   Objective:  Lab Results  Component Value Date   HGBA1C 6.9 (H) 01/21/2021   Lab Results  Component Value Date  CREATININE 1.22 01/21/2021   CREATININE 1.25 10/09/2020   CREATININE 1.23 08/06/2020   Lab Results  Component Value Date   EGFR 65 01/21/2021   Current Barriers:  Knowledge Deficits related to basic Diabetes pathophysiology and self care/management Knowledge Deficits related to medications used for management of diabetes Case Manager Clinical Goal(s):  patient will demonstrate improved adherence to prescribed treatment plan for diabetes self care/management as evidenced  by: daily monitoring and recording of CBG  adherence to ADA/ carb modified diet exercise 5 days/week adherence to prescribed medication regimen contacting provider for new or worsened symptoms or questions Interventions:  01/23/21 completed successful outbound call with patient  Collaboration with William Chard, MD regarding development and update of comprehensive plan of care as evidenced by provider attestation and co-signature Inter-disciplinary care team collaboration (see longitudinal plan of care) Provided education to patient about basic DM disease process Educated on dietary and exercise recommendations  Review of patient status, including review of consultants reports, relevant laboratory and other test results, and medications completed. Reviewed medications with patient and discussed importance of medication adherence Provided patient with written educational materials related to hypo and hyperglycemia and importance of correct treatment Advised patient, providing education and rationale, to check cbg daily before meals and at bedtime and record, calling the CCM team and or PCP for findings outside established parameters.   Reviewed scheduled/upcoming provider appointments including: next PCP follow appointment scheduled for: 06/04/21 _0 :30 AM  Printed educational materials related to Low Carb Smoothies; Mediterranean diet Discussed plans with patient for ongoing care management follow up and provided patient with direct contact information for care management team Self-Care Activities Self administers oral medications as prescribed Attends all scheduled provider appointments Checks blood sugars as prescribed and utilize hyper and hypoglycemia protocol as needed Adheres to prescribed ADA/carb modified Patient Goals: - check blood sugar at prescribed times - check blood sugar before and after exercise - check blood sugar if I feel it is too high or too low - take the blood sugar log  to all doctor visits - take the blood sugar meter to all doctor visits  Follow Up Plan: Telephone follow up appointment with care management team member scheduled for: 06/07/21    Care Plan : Hypertension (Adult)  Updates made by Lynne Logan, RN since 01/31/2021 12:00 AM     Problem: Disease Progression (Hypertension)   Priority: Medium     Long-Range Goal: Disease Progression Prevented or Minimized   Start Date: 09/05/2020  Expected End Date: 09/05/2021  Recent Progress: On track  Priority: Medium  Note:   Objective:  Last practice recorded BP readings:  BP Readings from Last 3 Encounters:  01/21/21 138/80  10/09/20 (!) 144/72  08/06/20 138/88   Most recent eGFR/CrCl:  Lab Results  Component Value Date   EGFR 65 01/21/2021    No components found for: CRCL Current Barriers:  Knowledge Deficits related to basic understanding of hypertension pathophysiology and self care management Knowledge Deficits related to understanding of medications prescribed for management of hypertension Case Manager Clinical Goal(s):  patient will demonstrate improved adherence to prescribed treatment plan for hypertension as evidenced by taking all medications as prescribed, monitoring and recording blood pressure as directed, adhering to low sodium/DASH diet Interventions:  01/23/21 completed successful outbound call with patient  Collaboration with William Chard, MD regarding development and update of comprehensive plan of care as evidenced by provider attestation and co-signature Inter-disciplinary care team collaboration (see longitudinal plan of care) Evaluation of current  treatment plan related to hypertension self management and patient's adherence to plan as established by provider. Provided education to patient re: stroke prevention, s/s of heart attack and stroke, DASH diet, complications of uncontrolled blood pressure Reviewed medications with patient and discussed importance of  compliance Advised patient, providing education and rationale, to monitor blood pressure daily and record, calling PCP for findings outside established parameters.  Mailed printed educational materials related to Why Should I Lower Sodium? Discussed plans with patient for ongoing care management follow up and provided patient with direct contact information for care management team Self-Care Activities: Self administers medications as prescribed Attends all scheduled provider appointments Calls provider office for new concerns, questions, or BP outside discussed parameters Checks BP and records as discussed Follows a low sodium diet/DASH diet Patient Goals: - check blood pressure 3 times per week - write blood pressure results in a log or diary  - continue to follow a low Sodium diet   Follow Up Plan: Telephone follow up appointment with care management team member scheduled for: 06/07/21    Plan:Telephone follow up appointment with care management team member scheduled for:  06/07/21  Barb Merino, RN, BSN, CCM Care Management Coordinator [place] [facility]  Direct Phone: [phone]

## 2021-02-04 ENCOUNTER — Telehealth: Payer: Self-pay | Admitting: Internal Medicine

## 2021-02-04 NOTE — Telephone Encounter (Signed)
Left message for patient to call back and schedule Medicare Annual Wellness Visit (AWV) either virtually or in office.   Left both my jabber number [phone] and office number    Last WTM 11/30/19  please schedule at anytime with [facility]    This should be a 45 minute visit.

## 2021-02-05 ENCOUNTER — Telehealth: Payer: Self-pay

## 2021-02-05 NOTE — Chronic Care Management (AMB) (Signed)
  Patient aware of telephone appointment with [name] CPP on 02-06-2021 at 12:00. Patient aware to have/bring all medications, supplements, blood pressure and/or blood sugar logs to visit.  Questions: Have you had any recent office visit or specialist visit outside of [place]? Patient stated no  Are there any concerns you would like to discuss during your office visit? Patient stated nothing he can think of.  Are you having any problems obtaining your medications? (Whether it pharmacy issues or cost) Patient stated no  If patient has any PAP medications ask if they are having any problems getting their PAP medication or refill? Patient stated no  Care Gaps: Shingrix overdue Covid booster overdue Last flu vaccine 04-03-2020 Medicare annual needs scheduling RAF= 0.449%  Star Rating Drug: Trulicity 1.5 mg- Patient assistance Enalapril 10 mg- Last filled 12-26-2020 [facility]   Any gaps in medications fill history? No  [place] Pharmacist Assistant [phone]

## 2021-02-06 ENCOUNTER — Telehealth: Payer: Self-pay

## 2021-02-06 ENCOUNTER — Telehealth: Payer: Medicare Other

## 2021-02-07 ENCOUNTER — Telehealth: Payer: Self-pay

## 2021-02-07 NOTE — Chronic Care Management (AMB) (Signed)
Chronic Care Management Pharmacy Assistant   Name: William Mcguire  MRN: [id number] DOB: December 14, 1955  Reason for Encounter: Disease State/ Hypertension  Recent office visits:  01-21-2021 [name], MD. Shingrix and B12 given. A1C= 6.9. Hemo= 12.3, Hema= 37.3. Glucose= 118, BUN= 8. EKG done.  01-23-2021 Little, Claudette Stapler, RN (CCM)  Recent consult visits:  None  Hospital visits:  None in previous 6 months  Medications: Outpatient Encounter Medications as of 02/07/2021  Medication Sig   amLODipine-atorvastatin (CADUET) 10-40 MG tablet TAKE 1 TABLET BY MOUTH EVERY DAY   aspirin EC 81 MG tablet Take 81 mg by mouth daily.   cyanocobalamin (,VITAMIN B-12,) 1000 MCG/ML injection INJECT 1 ML INTRAMUSCULARLY WEEKLY FOR 3 WEEKS, THEN USE ONCE MONTHLY (Patient taking differently: 1,000 mcg every 30 (thirty) days.)   Dulaglutide 1.5 MG/0.5ML SOPN Inject 1.5 mg into the skin once a week.    enalapril (VASOTEC) 10 MG tablet TAKE 1 TABLET BY MOUTH EVERY DAY   glucose blood test strip Use as instructed to test blood sugar 3 times a day. 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   Insulin Pen Needle (B-D ULTRAFINE III SHORT PEN) 31G X 8 MM MISC USE AS DIRECTED   metFORMIN (GLUCOPHAGE) 1000 MG tablet TAKE 1 TABLET BY MOUTH TWICE A DAY WITH A MEAL   sildenafil (VIAGRA) 100 MG tablet TAKE 1 TABLET BY MOUTH EVERY DAY AS NEEDED   SYRINGE-NEEDLE, DISP, 3 ML (B-D 3CC LUER-LOK SYR 25GX5/8") 25G X 5/8" 3 ML MISC USE AS DIRECTED   No facility-administered encounter medications on file as of 02/07/2021.   Reviewed chart prior to disease state call. Spoke with patient regarding BP  Recent Office Vitals: BP Readings from Last 3 Encounters:  01/21/21 138/80  10/09/20 (!) 144/72  08/06/20 138/88   Pulse Readings from Last 3 Encounters:  01/21/21 63  10/09/20 69  08/06/20 (!) 59    Wt Readings from Last 3 Encounters:   01/21/21 250 lb 9.6 oz (113.7 kg)  10/09/20 247 lb 9.6 oz (112.3 kg)  08/06/20 247 lb 12.8 oz (112.4 kg)     Kidney Function Lab Results  Component Value Date/Time   CREATININE 1.22 01/21/2021 09:23 AM   CREATININE 1.25 10/09/2020 11:41 AM   GFRNONAA 61 08/06/2020 10:44 AM   GFRAA 70 08/06/2020 10:44 AM    BMP Latest Ref Rng & Units 01/21/2021 10/09/2020 08/06/2020  Glucose 65 - 99 mg/dL 118(H) 91 92  BUN 8 - 27 mg/dL '10 13 13  '$ Creatinine 0.76 - 1.27 mg/dL 1.22 1.25 1.23  BUN/Creat Ratio 10 - 24 8(L) 10 11  Sodium 134 - 144 mmol/L 139 141 139  Potassium 3.5 - 5.2 mmol/L 4.7 4.7 4.8  Chloride 96 - 106 mmol/L 104 102 103  CO2 20 - 29 mmol/L '25 21 23  '$ Calcium 8.6 - 10.2 mg/dL 9.5 9.7 9.7    Current antihypertensive regimen:  Caduet 10-40 mg daily Enalapril 10 mg daily  How often are you checking your Blood Pressure? 3-5x per week  Current home BP readings: 140/80 What recent interventions/DTPs have been made by any provider to improve Blood Pressure control since last CPP Visit:  Patient states he has been checking blood pressure a few days weekly and taking medication as directed.  Any recent hospitalizations or ED visits since last visit with CPP? No  What diet changes have been made to improve  Blood Pressure Control?  Patient states he hasn't made any changes to his diet. Patient states he never cooked with salt but does eat plenty of vegetables/ fruits and drinks plenty of water.  What exercise is being done to improve your Blood Pressure Control?  Patient states he is constantly active with cleaning his yard and running errands. Patient states he does walk some days.  Adherence Review: Is the patient currently on ACE/ARB medication? Yes Does the patient have >5 day gap between last estimated fill dates? No  NOTES: Patient states he has been doing great. Patient's missed appointment with [name] CPP was rescheduled to October.  Care Gaps: Shingrix  overdue Covid booster overdue Last flu vaccine 04-03-2020 Medicare annual needs scheduling RAF= 0.449%  Star Rating Drugs: Trulicity 1.5 mg- Patient assistance Enalapril 10 mg- Last filled 12-26-2020 [facility] Metformin 1000 mg- Last filled 12-18-2019 [facility] (Patient states he has plenty of pills and [facility] has been [facility] medication)  [place] Pharmacist Assistant [phone]

## 2021-02-13 ENCOUNTER — Telehealth: Payer: Medicare Other

## 2021-02-13 ENCOUNTER — Other Ambulatory Visit: Payer: Self-pay | Admitting: Internal Medicine

## 2021-02-19 IMAGING — CT CT CHEST W/O CM
1 series · 14 of 34 positions shown, 18 images · non-contrast
Comparison: 07/04/2020 CT abdomen/pelvis.

CLINICAL DATA: Nodular lingular opacity at lung bases on recent CT
abdomen study performed for weight loss.

EXAM:
CT CHEST WITHOUT CONTRAST
TECHNIQUE: Multidetector CT imaging of the chest was performed following the
standard protocol without IV contrast.

[Series 2: chest w/(date) · axial · 0.83mm/px · z∈[-272,+2]mm · 14 of 161 slices shown, 18 images]
[im 12/161  mediastinal]
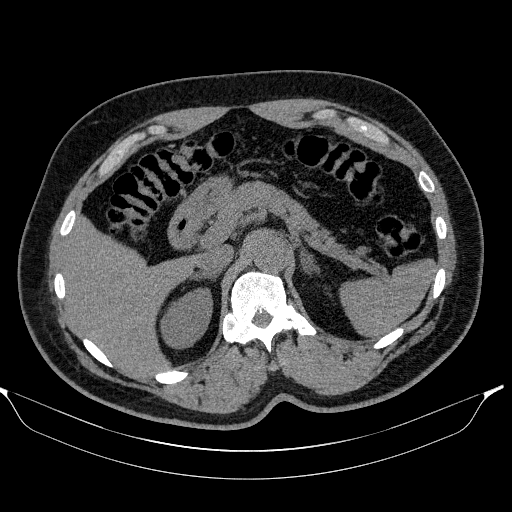
[im 12/161  lung]
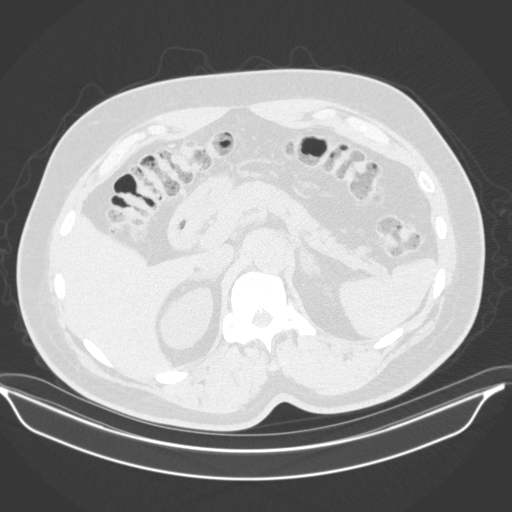
[im 24/161  lung]
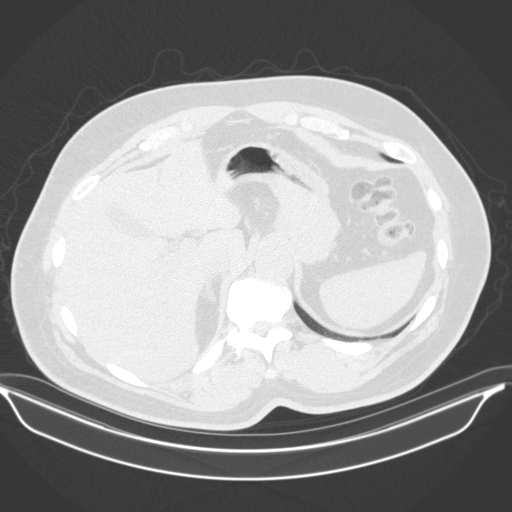
[im 33/161  lung]
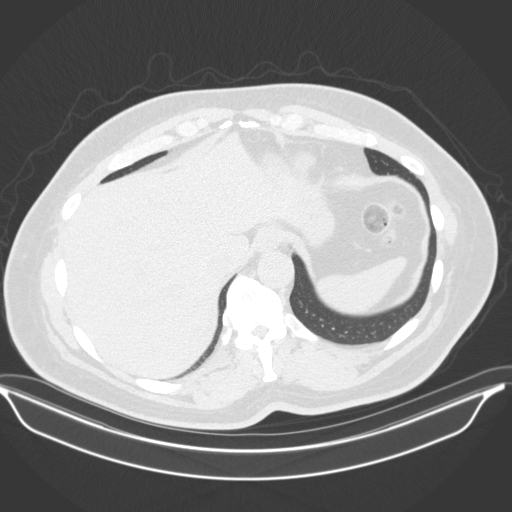
[im 48/161  lung]
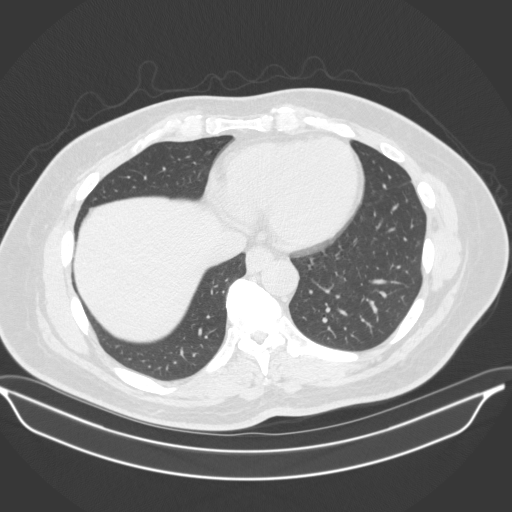
[im 60/161  mediastinal]
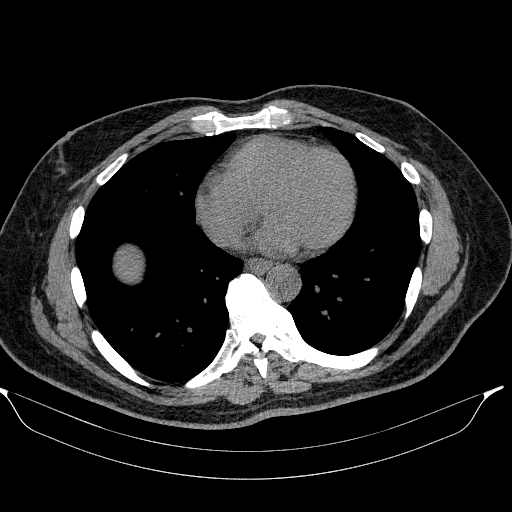
[im 60/161  lung]
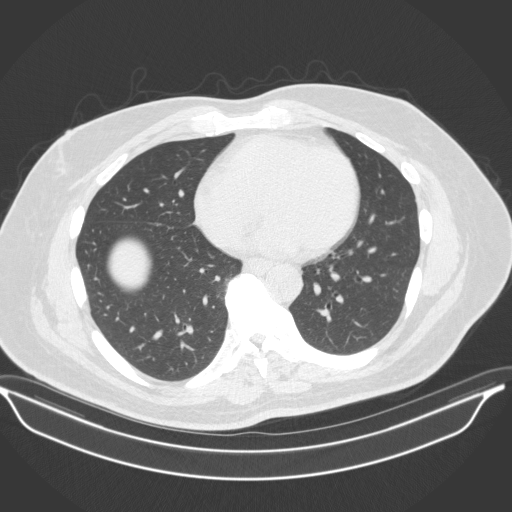
[im 66/161  lung]
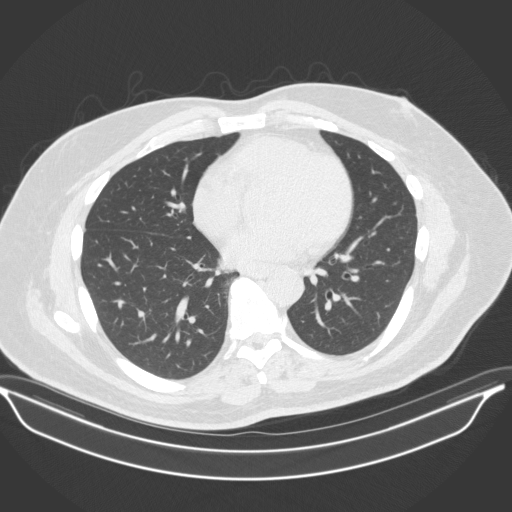
[im 76/161  lung]
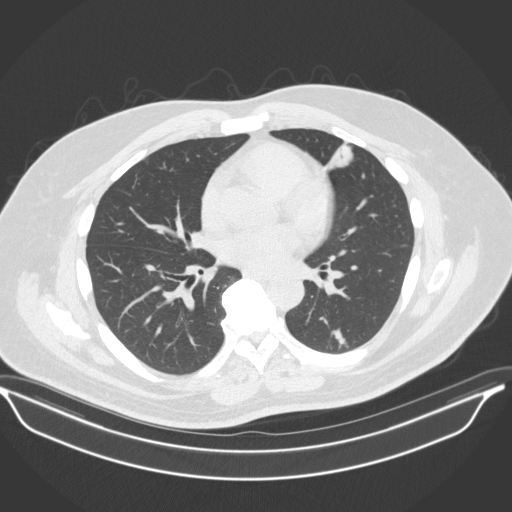
[im 86/161  lung]
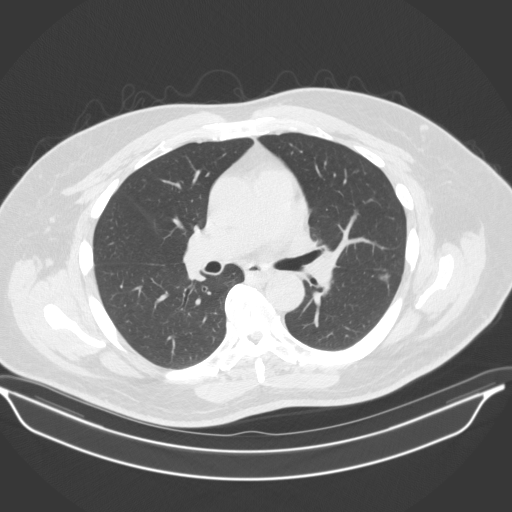
[im 95/161  mediastinal]
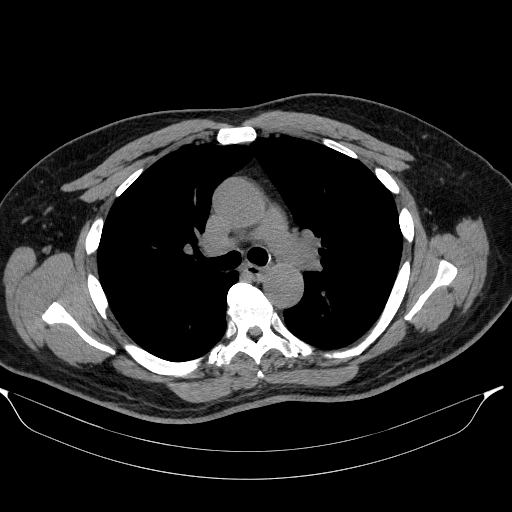
[im 95/161  lung]
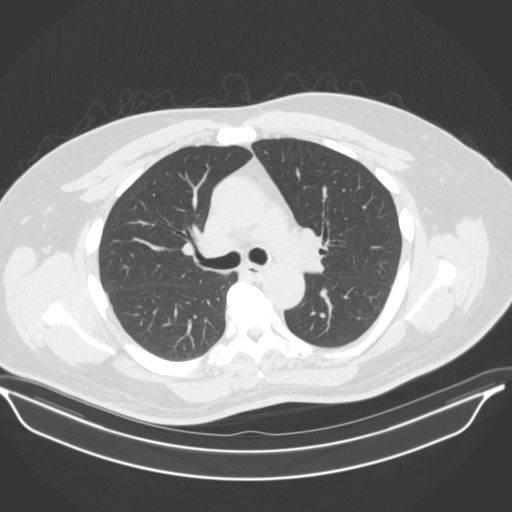
[im 101/161  lung]
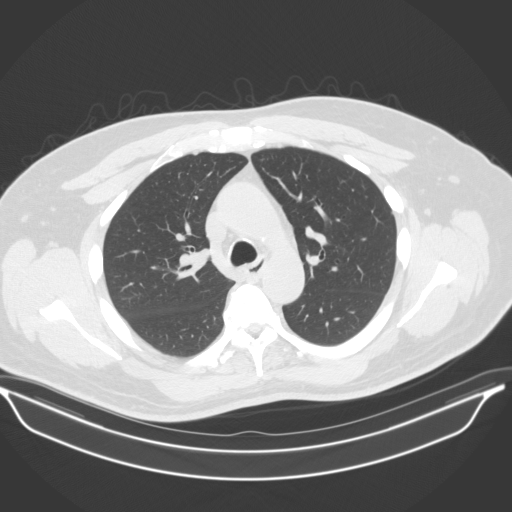
[im 119/161  lung]
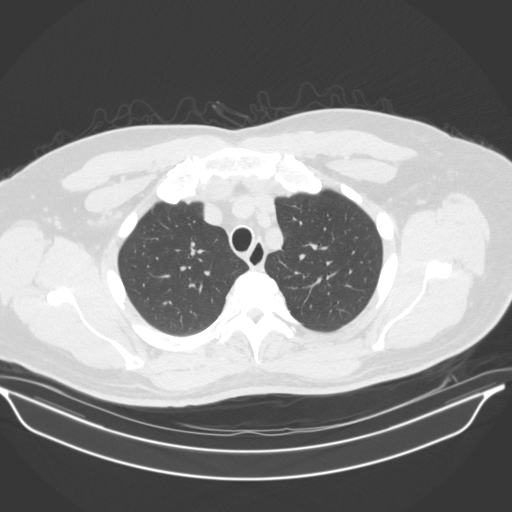
[im 129/161  lung]
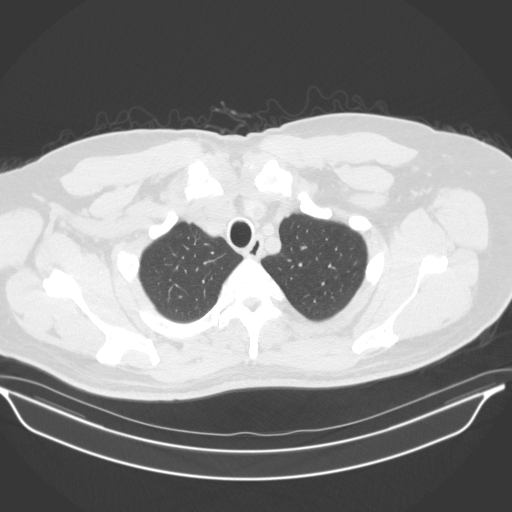
[im 137/161  mediastinal]
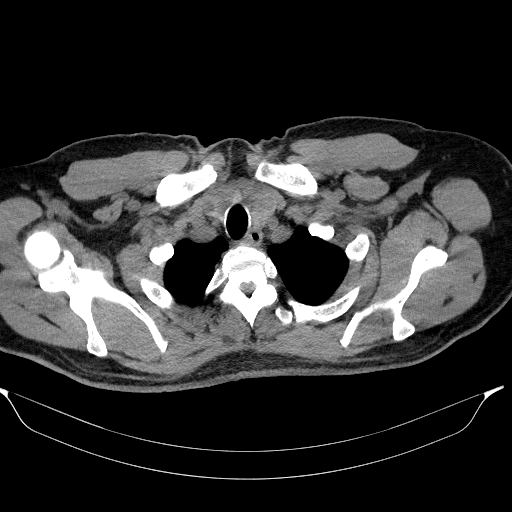
[im 137/161  lung]
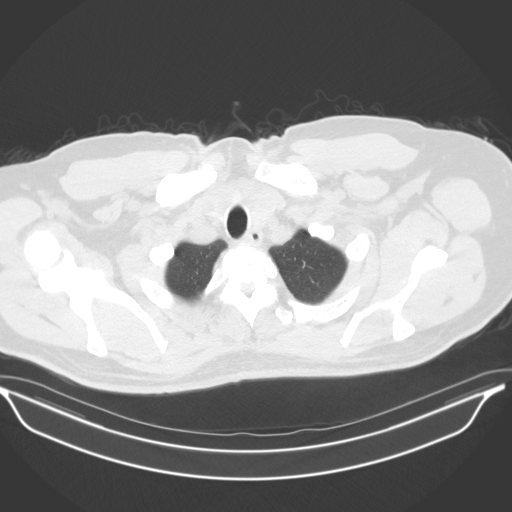
[im 149/161  lung]
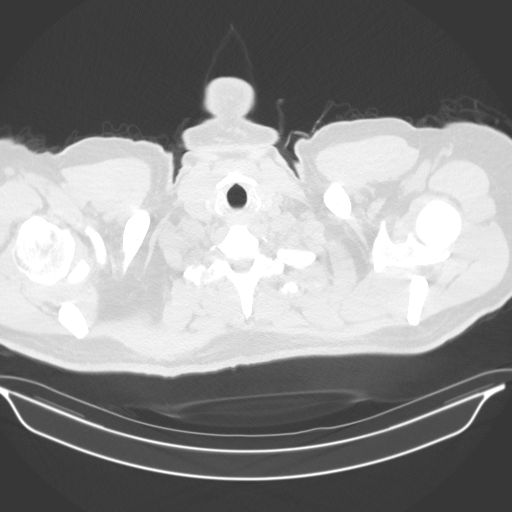

[14 of 34 positions shown; findings below may reference images not displayed]

FINDINGS: Cardiovascular: Normal heart size. No significant pericardial
effusion/thickening. Great vessels are normal in course and caliber.

Mediastinum/Nodes: Mild multinodular goiter with dominant hypodense
2.4 cm left thyroid nodule. Unremarkable esophagus. No
pathologically enlarged axillary, mediastinal or hilar lymph nodes,
noting limited sensitivity for the detection of hilar adenopathy on
this noncontrast study.

Lungs/Pleura: No pneumothorax. No pleural effusion. Anterior left
upper lobe 2.7 x 1.5 cm nodular and somewhat bandlike focus of
consolidation (series 5/image 86), previously 2.8 x 1.6 cm on
07/04/2020 CT abdomen study using similar measurement technique, not
appreciably changed. Branching tubular 1.9 x 1.5 cm focus of
consolidation with tiny internal calcifications in the posterior
left lower lobe (series 5/image 87), stable. Two scattered tiny
solid right upper lobe pulmonary nodules, largest 0.2 cm (series
5/image 42), not previously imaged.

Upper abdomen: Left adrenal 1.8 cm nodule with density -1 HU,
compatible with a benign adenoma.

Musculoskeletal: No aggressive appearing focal osseous lesions.
Moderate thoracic spondylosis.
IMPRESSION: 1. Anterior left upper lobe 2.7 x 1.5 cm nodular and somewhat
bandlike focus of consolidation, not appreciably changed since
recent 07/04/2020 CT abdomen study. Nodular scarring is favored,
although primary pulmonary neoplasm is not entirely excluded.
Suggest follow-up chest CT in 3-6 months.
2. Branching tubular focus of consolidation with tiny internal
calcifications in the posterior left lower lobe, stable, compatible
with mucoid impaction within dilated peripheral airways.
3. Two scattered tiny solid right upper lobe pulmonary nodules,
largest 0.2 cm, not previously imaged, which can also be reassessed
on follow-up chest CT.
4. No thoracic adenopathy.
5. Mild multinodular goiter with dominant 2.4 cm left thyroid
nodule. Recommend thyroid US (ref: [HOSPITAL]. [DATE]):
6. Left adrenal adenoma.

## 2021-03-07 ENCOUNTER — Telehealth: Payer: Self-pay | Admitting: Internal Medicine

## 2021-03-07 NOTE — Telephone Encounter (Signed)
Left message for patient to call back and schedule Medicare Annual Wellness Visit (AWV) either virtually or in office.  Left both my jabber number [phone] and office number    Last WTM  11/30/19 please schedule at anytime with [facility]    This should be a 45 minute visit.

## 2021-03-20 ENCOUNTER — Telehealth: Payer: Self-pay | Admitting: Internal Medicine

## 2021-03-20 NOTE — Telephone Encounter (Signed)
Left message for patient to call back and schedule Medicare Annual Wellness Visit (AWV) either virtually or in office.  Left both my jabber number [phone] and office number    Last WTM  11/30/19 please schedule at anytime with [facility]    This should be a 45 minute visit.

## 2021-03-28 ENCOUNTER — Telehealth: Payer: Medicare Other

## 2021-04-03 ENCOUNTER — Telehealth: Payer: Self-pay

## 2021-04-03 ENCOUNTER — Telehealth: Payer: Medicare Other

## 2021-04-03 NOTE — Chronic Care Management (AMB) (Signed)
   William Mcguire was reminded to have all medications, supplements and any blood glucose and blood pressure readings available for review with [name], Pharm. D, at his telephone visit on 04-04-2021 at 10:15.   Questions: Have you had any recent office visit or specialist visit outside of [place]? Patient stated no  Are there any concerns you would like to discuss during your office visit? Patient stated no  Are you having any problems obtaining your medications? (Whether it pharmacy issues or cost) Patient stated no  If patient has any PAP medications ask if they are having any problems getting their PAP medication or refill? Patient stated no  Care Gaps: Shingrix overdue Covid booster overdue Last flu vaccine 04-03-2020 Medicare annual needs scheduling RAF= 0.449%  Star Rating Drug: Trulicity 1.5 mg- Patient assistance Enalapril 10 mg- Last filled 03-23-2021 [facility] Metformin 1000 mg- Last filled 12-18-2019 [facility] (Patient states he has plenty of pills and [facility])  Any gaps in medications fill history? No  NOTES: Patient would like a call on his mobile line.  [place] Pharmacist Assistant [phone]

## 2021-04-04 ENCOUNTER — Telehealth: Payer: Medicare Other

## 2021-04-10 DIAGNOSIS — E113293 Type 2 diabetes mellitus with mild nonproliferative diabetic retinopathy without macular edema, bilateral: Secondary | ICD-10-CM | POA: Diagnosis not present

## 2021-04-10 DIAGNOSIS — H2513 Age-related nuclear cataract, bilateral: Secondary | ICD-10-CM | POA: Diagnosis not present

## 2021-04-16 ENCOUNTER — Telehealth: Payer: Self-pay | Admitting: Internal Medicine

## 2021-04-16 NOTE — Telephone Encounter (Signed)
Left message for patient to call back and schedule Medicare Annual Wellness Visit (AWV) either virtually or in office.  Left both my jabber number [phone] and office number    Last WTM  11/30/19  please schedule at anytime with [facility]    This should be a 45 minute visit.

## 2021-04-17 ENCOUNTER — Telehealth: Payer: Self-pay

## 2021-04-17 NOTE — Chronic Care Management (AMB) (Signed)
    Chronic Care Management Pharmacy Assistant   Name: Corry Ihnen  MRN: [id number] DOB: [birth year]    Reason for Encounter: Reschedule missed appointment    Medications: Outpatient Encounter Medications as of 04/17/2021  Medication Sig   amLODipine-atorvastatin (CADUET) 10-40 MG tablet TAKE 1 TABLET BY MOUTH EVERY DAY   aspirin EC 81 MG tablet Take 81 mg by mouth daily.   cyanocobalamin (,VITAMIN B-12,) 1000 MCG/ML injection INJECT 1 ML INTRAMUSCULARLY WEEKLY FOR 3 WEEKS, THEN USE ONCE MONTHLY (Patient taking differently: 1,000 mcg every 30 (thirty) days.)   Dulaglutide 1.5 MG/0.5ML SOPN Inject 1.5 mg into the skin once a week.    enalapril (VASOTEC) 10 MG tablet TAKE 1 TABLET BY MOUTH EVERY DAY   glucose blood test strip Use as instructed to test blood sugar 3 times a day. 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   Insulin Pen Needle (B-D ULTRAFINE III SHORT PEN) 31G X 8 MM MISC USE AS DIRECTED   metFORMIN (GLUCOPHAGE) 1000 MG tablet TAKE 1 TABLET BY MOUTH TWICE A DAY WITH A MEAL   sildenafil (VIAGRA) 100 MG tablet TAKE 1 TABLET BY MOUTH EVERY DAY AS NEEDED   SYRINGE-NEEDLE, DISP, 3 ML (B-D 3CC LUER-LOK SYR 25GX5/8") 25G X 5/8" 3 ML MISC USE AS DIRECTED   No facility-administered encounter medications on file as of 04/17/2021.   04-17-2021: Contacted patient to reschedule missed appointment. Patient stated he thinks his cell doesn't accept blocked numbers so that's why [name] CPP calls haven't been successful. Scheduled patient for 05-21-2021 and he states to call his house number. Patient states he is up to date with all medications.   [place] Pharmacist Assistant [phone]

## 2021-04-22 ENCOUNTER — Telehealth: Payer: Self-pay

## 2021-04-22 ENCOUNTER — Other Ambulatory Visit: Payer: Self-pay | Admitting: Internal Medicine

## 2021-04-22 NOTE — Chronic Care Management (AMB) (Signed)
    Chronic Care Management Pharmacy Assistant   Name: William Mcguire  MRN: [id number] DOB: [birth year]   Reason for Encounter: Adherence report review    Medications: Outpatient Encounter Medications as of 04/22/2021  Medication Sig   amLODipine-atorvastatin (CADUET) 10-40 MG tablet TAKE 1 TABLET BY MOUTH EVERY DAY   aspirin EC 81 MG tablet Take 81 mg by mouth daily.   cyanocobalamin (,VITAMIN B-12,) 1000 MCG/ML injection INJECT 1 ML INTRAMUSCULARLY WEEKLY FOR 3 WEEKS, THEN USE ONCE MONTHLY (Patient taking differently: 1,000 mcg every 30 (thirty) days.)   Dulaglutide 1.5 MG/0.5ML SOPN Inject 1.5 mg into the skin once a week.    enalapril (VASOTEC) 10 MG tablet TAKE 1 TABLET BY MOUTH EVERY DAY   glucose blood test strip Use as instructed to test blood sugar 3 times a day. 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   Insulin Pen Needle (B-D ULTRAFINE III SHORT PEN) 31G X 8 MM MISC USE AS DIRECTED   metFORMIN (GLUCOPHAGE) 1000 MG tablet TAKE 1 TABLET BY MOUTH TWICE A DAY WITH A MEAL   sildenafil (VIAGRA) 100 MG tablet TAKE 1 TABLET BY MOUTH EVERY DAY AS NEEDED   SYRINGE-NEEDLE, DISP, 3 ML (B-D 3CC LUER-LOK SYR 25GX5/8") 25G X 5/8" 3 ML MISC USE AS DIRECTED   No facility-administered encounter medications on file as of 04/22/2021.   04-22-2021: AWV 01-21-21 BP 138/80 A1C 6.9, Last office visit 10-17-20 next office visit 06-04-21  [place] Pharmacist Assistant [phone]

## 2021-05-08 ENCOUNTER — Telehealth: Payer: Self-pay | Admitting: Internal Medicine

## 2021-05-08 NOTE — Telephone Encounter (Signed)
Left message for patient to call back and schedule Medicare Annual Wellness Visit (AWV) either virtually or in office.  Left both my jabber number [phone] and office number    Last AWV 11/26/19 ; please schedule at anytime with [facility]    This should be a 45 minute visit.

## 2021-05-20 ENCOUNTER — Telehealth: Payer: Self-pay | Admitting: Internal Medicine

## 2021-05-20 ENCOUNTER — Telehealth: Payer: Self-pay

## 2021-05-20 NOTE — Telephone Encounter (Signed)
Left message for patient to call back and schedule Medicare Annual Wellness Visit (AWV) either virtually or in office.  Left both my jabber number [phone] and office number    Last WTM  11/30/19;  please schedule at anytime with [facility]    This should be a 45 minute visit.

## 2021-05-20 NOTE — Chronic Care Management (AMB) (Signed)
  William Mcguire was reminded to have all medications, supplements and any blood glucose and blood pressure readings available for review with [name], Pharm. D, at his telephone visit on 05-21-2021 at 8:30.   Questions: Have you had any recent office visit or specialist visit outside of [place]? Patient stated no  Are there any concerns you would like to discuss during your office visit? Patient stated no  Are you having any problems obtaining your medications? (Whether it pharmacy issues or cost) Patient stated no  If patient has any PAP medications ask if they are having any problems getting their PAP medication or refill? Patient stated no  Care Gaps: Shingrix overdue PNA Vac overdue Last flu vaccine 04-03-2020  Star Rating Drug: Trulicity 1.5 mg- Patient assistance Enalapril 10 mg- Last filled [facility]-23-2021 [facility] Metformin 1000 mg- Last filled [facility]-22-2021 [facility]   Any gaps in medications fill history? No  [place] Pharmacist Assistant [phone]

## 2021-05-21 ENCOUNTER — Ambulatory Visit (INDEPENDENT_AMBULATORY_CARE_PROVIDER_SITE_OTHER): Payer: Medicare Other

## 2021-05-21 DIAGNOSIS — E782 Mixed hyperlipidemia: Secondary | ICD-10-CM

## 2021-05-21 DIAGNOSIS — I10 Essential (primary) hypertension: Secondary | ICD-10-CM

## 2021-05-21 DIAGNOSIS — Z794 Long term (current) use of insulin: Secondary | ICD-10-CM

## 2021-05-21 DIAGNOSIS — E1122 Type 2 diabetes mellitus with diabetic chronic kidney disease: Secondary | ICD-10-CM

## 2021-05-21 MED ORDER — ATORVASTATIN CALCIUM 40 MG PO TABS: 40.0000 mg | ORAL_TABLET | Freq: Every day | ORAL | 1 refills | Status: DC

## 2021-05-21 MED ORDER — AMLODIPINE BESYLATE 10 MG PO TABS: 10.0000 mg | ORAL_TABLET | Freq: Every day | ORAL | 1 refills | Status: DC

## 2021-05-21 NOTE — Progress Notes (Signed)
Chronic Care Management Pharmacy Note  05/22/2021 Name:  William Mcguire MRN:  [id number] DOB:  02-15-1956  Summary: Patient reports that he has been doing well and writing down his BS and BP readings in a log book   Recommendations/Changes made from today's visit: Recommend patient complete patient assistance paperwork for Trulicity for next year as mailed by [place] team. Recommend receiving flu shot and pneumonia vaccine. Refilled patients Atorvastatin 40 mg tablet and Amlodipine 10 mg tablet.   Plan: Patient to receive flu shot during next office visit with Dr. Baird Cancer.    Subjective: William Mcguire is an 65 y.o. year old male who is a primary patient of [name], MD.  The CCM team was consulted for assistance with disease management and care coordination needs.    Engaged with patient by telephone for follow up visit in response to provider referral for pharmacy case management and/or care coordination services.   Consent to Services:  The patient was given information about Chronic Care Management services, agreed to services, and gave verbal consent prior to initiation of services.  Please see initial visit note for detailed documentation.   Patient Care Team: [name], MD as PCP - General (Internal Medicine) Rex Kras, Claudette Stapler, RN as Case Manager [place], Kennieth Francois, [facility] (Inactive) (Pharmacist)  Recent office visits: 01/21/2021 PCP OV  Recent consult visits: None noted in the past 6 months   Hospital visits: None in previous 6 months   Objective:  Lab Results  Component Value Date   CREATININE 1.22 01/21/2021   BUN 10 01/21/2021   GFRNONAA 61 08/06/2020   GFRAA 70 08/06/2020   NA 139 01/21/2021   K 4.7 01/21/2021   CALCIUM 9.5 01/21/2021   CO2 25 01/21/2021   GLUCOSE 118 (H) 01/21/2021    Lab Results  Component Value Date/Time   HGBA1C 6.9 (H) 01/21/2021 09:23 AM   HGBA1C 6.3 (H) 10/09/2020 11:41 AM   MICROALBUR 30 01/21/2021 12:44 PM    MICROALBUR 30 11/30/2019 09:55 AM    Last diabetic Eye exam:  Lab Results  Component Value Date/Time   HMDIABEYEEXA Retinopathy (A) 10/11/2020 12:00 AM    Last diabetic Foot exam: No results found for: HMDIABFOOTEX   Lab Results  Component Value Date   CHOL 135 01/21/2021   HDL 49 01/21/2021   LDLCALC 74 01/21/2021   TRIG 56 01/21/2021   CHOLHDL 2.8 01/21/2021    Hepatic Function Latest Ref Rng & Units 01/21/2021 11/30/2019 05/30/2019  Total Protein 6.0 - 8.5 g/dL 7.3 7.8 7.8  Albumin 3.8 - 4.8 g/dL 4.1 4.5 4.4  AST 0 - 40 IU/L $Remov'21 22 28  'dnMZhG$ ALT 0 - 44 IU/L $Remov'17 11 20  'UUiVvN$ Alk Phosphatase 44 - 121 IU/L 117 116 111  Total Bilirubin 0.0 - 1.2 mg/dL 0.2 0.5 0.3    Lab Results  Component Value Date/Time   TSH 2.330 08/06/2020 11:12 AM   FREET4 1.07 08/06/2020 11:12 AM    CBC Latest Ref Rng & Units 01/21/2021 08/06/2020 11/30/2019  WBC 3.4 - 10.8 x10E3/uL 4.9 5.2 5.4  Hemoglobin 13.0 - 17.7 g/dL 12.3(L) 12.2(L) 12.8(L)  Hematocrit 37.5 - 51.0 % 37.3(L) 37.2(L) 38.9  Platelets 150 - 450 x10E3/uL 227 236 245    No results found for: VD25OH  Clinical ASCVD: No  The 10-year ASCVD risk score (Arnett DK, et al., 2019) is: 31.1%   Values used to calculate the score:     Age: 65 years     Sex:  Male     Is Non-Hispanic African American: Yes     Diabetic: Yes     Tobacco smoker: No     Systolic Blood Pressure: 440 mmHg     Is BP treated: Yes     HDL Cholesterol: 49 mg/dL     Total Cholesterol: 135 mg/dL    Depression screen [facility] 2/9 01/21/2021 11/30/2019 04/25/2019  Decreased Interest 0 0 0  Down, Depressed, Hopeless 0 0 0  PHQ - 2 Score 0 0 0  Altered sleeping 0 - -  Tired, decreased energy 0 - -  Change in appetite 0 - -  Feeling bad or failure about yourself  0 - -  Trouble concentrating 0 - -  Moving slowly or fidgety/restless 0 - -  Suicidal thoughts 0 - -  PHQ-9 Score 0 - -     Social History   Tobacco Use  Smoking Status Never  Smokeless Tobacco Never  Tobacco Comments    n/a   BP Readings from Last 3 Encounters:  01/21/21 138/80  10/09/20 (!) 144/72  08/06/20 138/88   Pulse Readings from Last 3 Encounters:  01/21/21 63  10/09/20 69  08/06/20 (!) 59   Wt Readings from Last 3 Encounters:  01/21/21 250 lb 9.6 oz (113.7 kg)  10/09/20 247 lb 9.6 oz (112.3 kg)  08/06/20 247 lb 12.8 oz (112.4 kg)   BMI Readings from Last 3 Encounters:  01/21/21 33.80 kg/m  10/09/20 33.39 kg/m  08/06/20 33.42 kg/m    Assessment/Interventions: Review of patient past medical history, allergies, medications, health status, including review of consultants reports, laboratory and other test data, was performed as part of comprehensive evaluation and provision of chronic care management services.   SDOH:  (Social Determinants of Health) assessments and interventions performed: Yes  SDOH Screenings   Alcohol Screen: Not on file  Depression (PHQ2-9): Low Risk    PHQ-2 Score: 0  Financial Resource Strain: Not on file  Food Insecurity: Not on file  Housing: Not on file  Physical Activity: Not on file  Social Connections: Not on file  Stress: Not on file  Tobacco Use: Low Risk    Smoking Tobacco Use: Never   Smokeless Tobacco Use: Never   Passive Exposure: Not on file  Transportation Needs: Not on file    [place]  No Known Allergies  Medications Reviewed Today     Reviewed by Lynne Logan, RN (Registered Nurse) on 01/23/21 at 1128  Med List Status: <None>   Medication Order Taking? Sig Documenting Provider Last Dose Status Informant  amLODipine-atorvastatin (CADUET) 10-40 MG tablet [id number] No TAKE 1 TABLET BY MOUTH EVERY DAY [name], MD Taking Active   aspirin EC 81 MG tablet [id number] No Take 81 mg by mouth daily. [provider] Taking Active Multiple Informants  cyanocobalamin (,VITAMIN B-12,) 1000 MCG/ML injection [id number] No INJECT 1 ML INTRAMUSCULARLY WEEKLY FOR 3 WEEKS, THEN USE ONCE MONTHLY  Patient taking differently: 1,000  mcg every 30 (thirty) days.   [name], MD Taking Active   Dulaglutide 1.5 MG/0.5ML [facility] [id number] No Inject 1.5 mg into the skin once a week.  [provider] Taking Active   enalapril (VASOTEC) 10 MG tablet [id number] No TAKE 1 TABLET BY MOUTH EVERY DAY [name], MD Taking Active   glucose blood test strip [id number] No Use as instructed to test blood sugar 3 times a day. Dx code: e11.65 [name], MD Taking Active   Insulin Glargine [facility])  100 UNIT/ML [id number] No Inject 0.22 mLs (22 Units total) into the skin at bedtime. [name], MD Taking Active   Insulin Pen Needle (B-D ULTRAFINE III SHORT PEN) 31G X 8 MM MISC [id number] No USE AS DIRECTED [name], MD Taking Active   metFORMIN (GLUCOPHAGE) 1000 MG tablet [id number] No TAKE 1 TABLET BY MOUTH TWICE A DAY WITH A MEAL [name], MD Taking Active   sildenafil (VIAGRA) 100 MG tablet [id number] No TAKE 1 TABLET BY MOUTH EVERY DAY AS NEEDED [name], MD Taking Active   SYRINGE-NEEDLE, DISP, 3 ML (B-D 3CC LUER-LOK SYR 25GX5/8") 25G X 5/8" 3 ML MISC [id number] No USE AS DIRECTED [name], MD Taking Active             Patient Active Problem List   Diagnosis Date Noted   Class 1 obesity due to excess calories with serious comorbidity and body mass index (BMI) of 33.0 to 33.9 in adult 01/21/2021   Abnormal CT scan of lung 10/14/2020   Hypertensive nephropathy 08/05/2018   Class 1 obesity due to excess calories with serious comorbidity and body mass index (BMI) of 32.0 to 32.9 in adult 08/05/2018   Pyelonephritis 06/23/2011   AKI (acute kidney injury) ([place]) 06/23/2011   Diabetes ([place]) 06/23/2011   HTN (hypertension) 06/23/2011    Immunization History  Administered Date(s) Administered   Fluad Quad(high Dose 65+) 04/03/2020   Influenza Split 06/12/2011   Influenza, High Dose Seasonal PF 03/22/2019   Influenza,inj,Quad PF,6+ Mos 05/05/2018   Influenza-Unspecified 03/22/2019    Moderna Sars-Covid-2 Vaccination 09/10/2019, 10/08/2019, 05/01/2020   Pfizer Covid-19 Vaccine Bivalent Booster 60yr & up 05/13/2021   Pneumococcal Polysaccharide-23 04/25/2019    Conditions to be addressed/monitored:  Hypertension, Hyperlipidemia, and Diabetes  [place] Updates made by PMayford Knife RPampasince 05/22/2021 12:00 AM     Problem: HTN, DM II, HLD      Long-Range Goal: Disease Management   This Visit's Progress: On track  Note:   Current Barriers:  Unable to independently afford treatment regimen Unable to achieve control of cholesterol.    Pharmacist Clinical Goal(s):  Patient will achieve adherence to monitoring guidelines and medication adherence to achieve therapeutic efficacy through collaboration with PharmD and provider.   Interventions: 1:1 collaboration with SGlendale Chard MD regarding development and update of comprehensive plan of care as evidenced by provider attestation and co-signature Inter-disciplinary care team collaboration (see longitudinal plan of care) Comprehensive medication review performed; medication list updated in electronic medical record  Hypertension (BP goal <130/80) -Controlled -Current treatment: Amlodipine 5 mg tablet once per day  Enalapril 5 mg tablet once per day  -Current home readings: checking BP at home: 122/80,140/92, 140/96, 124/87, 162/84  -Current dietary habits: he does not use a lot of salt -Current exercise habits: he is getting plenty of exercise, cutting the grass, cleaning the yard or walking, he is always busy. He is usually physical active for more than a hour almost everyday.  -Denies hypotensive/hypertensive symptoms -Educated on BP goals and benefits of medications for prevention of heart attack, stroke and kidney damage; Importance of home blood pressure monitoring; Proper BP monitoring technique; -Checking BP in the morning, and is checking every day.  -Counseled to  monitor BP at home at least five times per week, document, and provide log at future appointments -Counseled on diet and exercise extensively  Diabetes (A1c goal <7%) -Controlled -Current medications: Dulaglutide 1.5 mg once per week on Sunday  [name] Educational psychologist - take as directed  Metformin 1000 mg tablet twice per day -Current home glucose readings fasting glucose: <120, 111,127,155, 102, 99, 104, 145, 125, 96, 129, 105, 125, 101, 137, 109, 99, 95, 86, 107, 170  -Denies hypoglycemic/hyperglycemic symptoms -Current meal patterns:  drinks:   -Patient is no longer taking Actos, advised patient to call pharmacy and discontinue prescription  -Current exercise: please see hypertension  -Educated on A1c and blood sugar goals; Exercise goal of 150 minutes per week; -Counseled to check feet daily and get yearly eye exams -Spoke to patient about the patient assistance paperwork being completed and filled out  -Patient  -Counseled on diet and exercise extensively Recommended to continue current medication  Hyperlipidemia: (LDL goal < 70) -Uncontrolled -Current treatment: Atorvastatin 40 mg tablet once per day  -Current dietary patterns: patient reports that  -Current exercise habits: please see hypertension for more information -Educated on Cholesterol goals;  Benefits of statin for ASCVD risk reduction; Importance of limiting foods high in cholesterol; -Recommended to continue current medication   Patient Goals/Self-Care Activities Patient will:  - take medications as prescribed as evidenced by patient report and record review  Follow Up Plan: The patient has been provided with contact information for the care management team and has been advised to call with any health related questions or concerns.       Medication Assistance:  Trulicity obtained through [employer] medication assistance program.  Enrollment ends 05/2021.   Compliance/Adherence/Medication fill history: Care  Gaps: Shingrix Vaccine Pneumonia Vaccine Influenza Vaccine  Star-Rating Drugs: Trulicity 1.5 mg Enalapril 10 mg tablet Metformin 1000 mg tablet   Patient[facility]  [id number]Phone: [phone]: [phone] [facility] [address]Phone: [phone]: [phone] Uses pill box?  Pt endorses 95% compliance  We discussed: Benefits of medication synchronization, packaging and delivery as well as enhanced pharmacist oversight with [employer]. Patient decided to: Continue current medication management strategy  Care Plan and Follow Up Patient Decision:  Patient agrees to Care Plan and Follow-up.  Plan: The patient has been provided with contact information for the care management team and has been advised to call with any health related questions or concerns.   VOrlando Penner CPP, PharmD Clinical Pharmacist Practitioner [facility] [phone]

## 2021-05-22 DIAGNOSIS — I10 Essential (primary) hypertension: Secondary | ICD-10-CM | POA: Diagnosis not present

## 2021-05-22 DIAGNOSIS — E1169 Type 2 diabetes mellitus with other specified complication: Secondary | ICD-10-CM | POA: Diagnosis not present

## 2021-05-22 DIAGNOSIS — Z7984 Long term (current) use of oral hypoglycemic drugs: Secondary | ICD-10-CM

## 2021-05-22 DIAGNOSIS — Z794 Long term (current) use of insulin: Secondary | ICD-10-CM | POA: Diagnosis not present

## 2021-05-22 DIAGNOSIS — E782 Mixed hyperlipidemia: Secondary | ICD-10-CM | POA: Diagnosis not present

## 2021-05-22 NOTE — Patient Instructions (Signed)
Visit Information It was great speaking with you today!  Please let me know if you have any questions about our visit.   Goals Addressed             This Visit's Progress    Manage My Medicine       Timeframe:  Long-Range Goal Priority:  High Start Date:                             Expected End Date:                       Follow Up Date 09/12/2020    - call for medicine refill 2 or 3 days before it runs out - call if I am sick and can't take my medicine - keep a list of all the medicines I take; vitamins and herbals too - use a pillbox to sort medicine    Why is this important?   These steps will help you keep on track with your medicines.   Notes:  Please call if you have any questions         Patient Care Plan: CCM Pharmacy Care Plan     Problem Identified: HTN, DM II, HLD      Long-Range Goal: Disease Management   This Visit's Progress: On track  Note:   Current Barriers:  Unable to independently afford treatment regimen Unable to achieve control of cholesterol.    Pharmacist Clinical Goal(s):  Patient will achieve adherence to monitoring guidelines and medication adherence to achieve therapeutic efficacy through collaboration with PharmD and provider.   Interventions: 1:1 collaboration with [name], MD regarding development and update of comprehensive plan of care as evidenced by provider attestation and co-signature Inter-disciplinary care team collaboration (see longitudinal plan of care) Comprehensive medication review performed; medication list updated in electronic medical record  Hypertension (BP goal <130/80) -Controlled -Current treatment: Amlodipine 5 mg tablet once per day  Enalapril 5 mg tablet once per day  -Current home readings: checking BP at home: 122/80,140/92, 140/96, 124/87, 162/84  -Current dietary habits: he does not use a lot of salt -Current exercise habits: he is getting plenty of exercise, cutting the grass, cleaning the  yard or walking, he is always busy. He is usually physical active for more than a hour almost everyday.  -Denies hypotensive/hypertensive symptoms -Educated on BP goals and benefits of medications for prevention of heart attack, stroke and kidney damage; Importance of home blood pressure monitoring; Proper BP monitoring technique; -Checking BP in the morning, and is checking every day.  -Counseled to monitor BP at home at least five times per week, document, and provide log at future appointments -Counseled on diet and exercise extensively  Diabetes (A1c goal <7%) -Controlled -Current medications: Dulaglutide 1.5 mg once per week on Sunday  [facility] - take as directed  Metformin 1000 mg tablet twice per day -Current home glucose readings fasting glucose: <120, 111,127,155, 102, 99, 104, 145, 125, 96, 129, 105, 125, 101, 137, 109, 99, 95, 86, 107, 170  -Denies hypoglycemic/hyperglycemic symptoms -Current meal patterns:  drinks:   -Patient is no longer taking Actos, advised patient to call pharmacy and discontinue prescription  -Current exercise: please see hypertension  -Educated on A1c and blood sugar goals; Exercise goal of 150 minutes per week; -Counseled to check feet daily and get yearly eye exams -Spoke to patient about the patient assistance paperwork being completed  and filled out  -Patient  -Counseled on diet and exercise extensively Recommended to continue current medication  Hyperlipidemia: (LDL goal < 70) -Uncontrolled -Current treatment: Atorvastatin 40 mg tablet once per day  -Current dietary patterns: patient reports that  -Current exercise habits: please see hypertension for more information -Educated on Cholesterol goals;  Benefits of statin for ASCVD risk reduction; Importance of limiting foods high in cholesterol; -Recommended to continue current medication   Patient Goals/Self-Care Activities Patient will:  - take medications as prescribed as  evidenced by patient report and record review  Follow Up Plan: The patient has been provided with contact information for the care management team and has been advised to call with any health related questions or concerns.        Patient agreed to services and verbal consent obtained.   The patient verbalized understanding of instructions, educational materials, and care plan provided today and agreed to receive a mailed copy of patient instructions, educational materials, and care plan.   [name], PharmD Clinical Pharmacist [facility] [phone]

## 2021-06-03 ENCOUNTER — Other Ambulatory Visit: Payer: Self-pay

## 2021-06-03 MED ORDER — CYANOCOBALAMIN 1000 MCG/ML IJ SOLN
1000.0000 ug | INTRAMUSCULAR | 1 refills | Status: DC
Start: 2021-06-03 — End: 2022-12-10

## 2021-06-04 ENCOUNTER — Encounter: Payer: Self-pay | Admitting: Internal Medicine

## 2021-06-04 ENCOUNTER — Ambulatory Visit (INDEPENDENT_AMBULATORY_CARE_PROVIDER_SITE_OTHER): Payer: Medicare Other | Admitting: Internal Medicine

## 2021-06-04 ENCOUNTER — Other Ambulatory Visit: Payer: Self-pay

## 2021-06-04 VITALS — BP 126/78 | HR 63 | Temp 98.3°F | Ht 72.23 in | Wt 245.0 lb

## 2021-06-04 DIAGNOSIS — R5383 Other fatigue: Secondary | ICD-10-CM | POA: Diagnosis not present

## 2021-06-04 DIAGNOSIS — D513 Other dietary vitamin B12 deficiency anemia: Secondary | ICD-10-CM

## 2021-06-04 DIAGNOSIS — E1122 Type 2 diabetes mellitus with diabetic chronic kidney disease: Secondary | ICD-10-CM | POA: Diagnosis not present

## 2021-06-04 DIAGNOSIS — N182 Chronic kidney disease, stage 2 (mild): Secondary | ICD-10-CM | POA: Diagnosis not present

## 2021-06-04 DIAGNOSIS — Z6833 Body mass index (BMI) 33.0-33.9, adult: Secondary | ICD-10-CM

## 2021-06-04 DIAGNOSIS — Z794 Long term (current) use of insulin: Secondary | ICD-10-CM | POA: Diagnosis not present

## 2021-06-04 DIAGNOSIS — Z23 Encounter for immunization: Secondary | ICD-10-CM | POA: Diagnosis not present

## 2021-06-04 DIAGNOSIS — I129 Hypertensive chronic kidney disease with stage 1 through stage 4 chronic kidney disease, or unspecified chronic kidney disease: Secondary | ICD-10-CM

## 2021-06-04 DIAGNOSIS — E6609 Other obesity due to excess calories: Secondary | ICD-10-CM

## 2021-06-04 MED ORDER — CYANOCOBALAMIN 1000 MCG/ML IJ SOLN: 1000.0000 ug | Freq: Once | INTRAMUSCULAR | Status: DC

## 2021-06-04 NOTE — Patient Instructions (Signed)

## 2021-06-04 NOTE — Progress Notes (Signed)
I,[name],acting as a Education administrator for [name], MD.,have documented all relevant documentation on the behalf of [name], MD,as directed by  [name], MD while in the presence of [name], MD.  This visit occurred during the SARS-CoV-2 public health emergency.  Safety protocols were in place, including screening questions prior to the visit, additional usage of staff PPE, and extensive cleaning of exam room while observing appropriate contact time as indicated for disinfecting solutions.  Subjective:     Patient ID: William Mcguire , male    DOB: 04/30/1956 , 65 y.o.   MRN: [id number]   Chief Complaint  Patient presents with   Diabetes   Hypertension    HPI  He is here today for diabetes/HTN f/u.  He reports compliance with meds. He denies headaches, chest pain and shortness of breath. However, admits that he has been more fatigued. 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 However, he states he is "not tired". He just has low energy.   Diabetes He presents for his follow-up diabetic visit. He has type 2 diabetes mellitus. His disease course has been stable. There are no hypoglycemic associated symptoms. Associated symptoms include fatigue. Pertinent negatives for diabetes include no blurred vision and no chest pain. There are no hypoglycemic complications. Diabetic complications include nephropathy. Risk factors for coronary artery disease include diabetes mellitus, dyslipidemia, hypertension, male sex and sedentary lifestyle. His breakfast blood glucose is taken between 8-9 am. His breakfast blood glucose range is generally 90-110 mg/dl.  Hypertension This is a chronic problem. The current episode started more than 1 year ago. The problem has been gradually improving since onset. The problem is controlled. Pertinent negatives include no blurred vision, chest pain, palpitations or shortness of breath.    Past Medical History:   Diagnosis Date   Diabetes mellitus    High cholesterol    Hypertension      Family History  Problem Relation Age of Onset   Hypertension Mother    Multiple myeloma Mother    Hypertension Father    Diabetes Father      Current Outpatient Medications:    amLODipine (NORVASC) 10 MG tablet, Take 1 tablet (10 mg total) by mouth daily., Disp: 90 tablet, Rfl: 1   aspirin EC 81 MG tablet, Take 81 mg by mouth daily., Disp: , Rfl:    atorvastatin (LIPITOR) 40 MG tablet, Take 1 tablet (40 mg total) by mouth daily., Disp: 90 tablet, Rfl: 1   cyanocobalamin (,VITAMIN B-12,) 1000 MCG/ML injection, Inject 1 mL (1,000 mcg total) into the muscle every 30 (thirty) days., Disp: 9 mL, Rfl: 1   Dulaglutide 1.5 MG/0.5ML SOPN, Inject 1.5 mg into the skin once a week. , Disp: , Rfl:    enalapril (VASOTEC) 10 MG tablet, TAKE 1 TABLET BY MOUTH EVERY DAY, Disp: 90 tablet, Rfl: 2   glucose blood test strip, Use as instructed to test blood sugar 3 times a day. Dx code: e11.65, Disp: 100 each, Rfl: 12   Insulin Glargine ([name] KWIKPEN) 100 UNIT/ML, Inject 0.22 mLs (22 Units total) into the skin at bedtime., Disp: 45 mL, Rfl: 3   Insulin Pen Needle (B-D ULTRAFINE III SHORT PEN) 31G X 8 MM MISC, USE AS DIRECTED, Disp: 100 each, Rfl: PRN   metFORMIN (GLUCOPHAGE) 1000 MG tablet, TAKE 1 TABLET BY MOUTH TWICE A DAY WITH MEALS, Disp: 180 tablet, Rfl: 0   sildenafil (VIAGRA) 100 MG  tablet, TAKE 1 TABLET BY MOUTH EVERY DAY AS NEEDED, Disp: 10 tablet, Rfl: 5   SYRINGE-NEEDLE, DISP, 3 ML (B-D 3CC LUER-LOK SYR 25GX5/8") 25G X 5/8" 3 ML MISC, USE AS DIRECTED, Disp: 12 each, Rfl: 24   amlodipine-atorvastatin (CADUET) 10-20 MG tablet, 1 tab, Disp: , Rfl:   Current Facility-Administered Medications:    cyanocobalamin ((VITAMIN B-12)) injection 1,000 mcg, 1,000 mcg, Intramuscular, Once, [name], MD   No Known Allergies   Review of Systems  Constitutional:  Positive for fatigue.  Eyes:  Negative for blurred vision.   Respiratory: Negative.  Negative for shortness of breath.   Cardiovascular: Negative.  Negative for chest pain and palpitations.  Gastrointestinal: Negative.   Psychiatric/Behavioral: Negative.    All other systems reviewed and are negative.   Today's Vitals   06/04/21 1034  BP: 126/78  Pulse: 63  Temp: 98.3 F (36.8 C)  Weight: 245 lb (111.1 kg)  Height: 6' 0.23" (1.835 m)  PainSc: 0-No pain   Body mass index is 33.02 kg/m.  Wt Readings from Last 3 Encounters:  06/04/21 245 lb (111.1 kg)  01/21/21 250 lb 9.6 oz (113.7 kg)  10/09/20 247 lb 9.6 oz (112.3 kg)    BP Readings from Last 3 Encounters:  06/04/21 126/78  01/21/21 138/80  10/09/20 (!) 144/72    Objective:  Physical Exam Vitals and nursing note reviewed.  Constitutional:      Appearance: Normal appearance.  HENT:     Head: Normocephalic and atraumatic.     Nose:     Comments: Masked     Mouth/Throat:     Comments: Masked Eyes:     Extraocular Movements: Extraocular movements intact.  Cardiovascular:     Rate and Rhythm: Normal rate and regular rhythm.     Heart sounds: Normal heart sounds.  Pulmonary:     Effort: Pulmonary effort is normal.     Breath sounds: Normal breath sounds.  Musculoskeletal:     Cervical back: Normal range of motion.  Skin:    General: Skin is warm.  Neurological:     General: No focal deficit present.     Mental Status: He is alert.  Psychiatric:        Mood and Affect: Mood normal.        Assessment And Plan:     1. Type 2 diabetes mellitus with stage 2 chronic kidney disease, with long-term current use of insulin (HCC) Comments: Chronic, I will check labs as listed below. I will adjust meds as needed.  Encouraged to follow dietary recommendations.  - BMP8+EGFR - Hemoglobin A1c  2. Hypertensive nephropathy Comments: Chronic, well controlled. Encouraged to follow low sodium diet. NO med changes today.   3. Other fatigue Comments: I will check thyroid function  along with CBC. I will make further recommendations once his labs are available for review.  - TSH  4. Other dietary vitamin B12 deficiency anemia Comments: He was given vitamin B12 IM x 1 today. He is currently getting monthly injections.  - cyanocobalamin ((VITAMIN B-12)) injection 1,000 mcg - CBC no Diff - Iron, TIBC and Ferritin Panel  5. Class 1 obesity due to excess calories with serious comorbidity and body mass index (BMI) of 33.0 to 33.9 in adult Comments: He is encouraged to initially strive for BMI less than 30 to decrease cardiac risk. He is advised to exercise no less than 150 minutes per week.    6. Need for vaccination Comments: He was given high  dose flu vaccine.  - Flu Vaccine QUAD High Dose(Fluad)   Patient was given opportunity to ask questions. Patient verbalized understanding of the plan and was able to repeat key elements of the plan. All questions were answered to their satisfaction.   I, [name], MD, have reviewed all documentation for this visit. The documentation on 06/04/21 for the exam, diagnosis, procedures, and orders are all accurate and complete.   IF YOU HAVE BEEN REFERRED TO A SPECIALIST, IT MAY TAKE 1-2 WEEKS TO SCHEDULE/PROCESS THE REFERRAL. IF YOU HAVE NOT HEARD FROM US/SPECIALIST IN TWO WEEKS, PLEASE GIVE [place] A CALL AT [phone].   THE PATIENT IS ENCOURAGED TO PRACTICE SOCIAL DISTANCING DUE TO THE COVID-19 PANDEMIC.

## 2021-06-05 LAB — HEMOGLOBIN A1C
Est. average glucose Bld gHb Est-mCnc: 134 mg/dL
Hgb A1c MFr Bld: 6.3 % — ABNORMAL HIGH (ref 4.8–5.6)

## 2021-06-05 LAB — TSH: TSH: 1.72 u[IU]/mL (ref 0.450–4.500)

## 2021-06-05 LAB — CBC
Hematocrit: 37.4 % — ABNORMAL LOW (ref 37.5–51.0)
Hemoglobin: 12.3 g/dL — ABNORMAL LOW (ref 13.0–17.7)
MCH: 28.3 pg (ref 26.6–33.0)
MCHC: 32.9 g/dL (ref 31.5–35.7)
MCV: 86 fL (ref 79–97)
Platelets: 224 10*3/uL (ref 150–450)
RBC: 4.34 x10E6/uL (ref 4.14–5.80)
RDW: 13.2 % (ref 11.6–15.4)
WBC: 5.5 10*3/uL (ref 3.4–10.8)

## 2021-06-05 LAB — IRON,TIBC AND FERRITIN PANEL
Ferritin: 86 ng/mL (ref 30–400)
Iron Saturation: 25 % (ref 15–55)
Iron: 69 ug/dL (ref 38–169)
Total Iron Binding Capacity: 274 ug/dL (ref 250–450)
UIBC: 205 ug/dL (ref 111–343)

## 2021-06-05 LAB — BMP8+EGFR
BUN/Creatinine Ratio: 13 (ref 10–24)
BUN: 15 mg/dL (ref 8–27)
CO2: 24 mmol/L (ref 20–29)
Calcium: 9.9 mg/dL (ref 8.6–10.2)
Chloride: 101 mmol/L (ref 96–106)
Creatinine, Ser: 1.2 mg/dL (ref 0.76–1.27)
Glucose: 77 mg/dL (ref 70–99)
Potassium: 4.7 mmol/L (ref 3.5–5.2)
Sodium: 137 mmol/L (ref 134–144)
eGFR: 66 mL/min/{1.73_m2} (ref >59)

## 2021-06-07 ENCOUNTER — Telehealth: Payer: Medicare Other

## 2021-06-07 ENCOUNTER — Telehealth: Payer: Self-pay

## 2021-06-07 NOTE — Telephone Encounter (Signed)
°  Care Management   Follow Up Note   06/07/2021 Name: William Mcguire MRN: [id number] DOB: April 19, 1956   Referred by: [name], MD Reason for referral : Chronic Care Management (RN CM Follow up call )   An unsuccessful telephone outreach was attempted today. The patient was referred to the case management team for assistance with care management and care coordination.   Follow Up Plan: A HIPPA compliant phone message was left for the patient providing contact information and requesting a return call.    Barb Merino, RN, BSN, CCM Care Management Coordinator [place] [facility]  Direct Phone: [phone]

## 2021-06-10 ENCOUNTER — Telehealth: Payer: Self-pay | Admitting: Internal Medicine

## 2021-06-10 NOTE — Telephone Encounter (Signed)
Left message for patient to call back and schedule Medicare Annual Wellness Visit (AWV) either virtually or in office. Left  my Herbie Drape number [phone]   Last WTM 11/30/19 ; please schedule at anytime with [facility] Nurse Health Advisor 1 or 2   This should be a 45 minute visit.

## 2021-06-11 ENCOUNTER — Telehealth: Payer: Self-pay | Admitting: Internal Medicine

## 2021-06-11 ENCOUNTER — Telehealth: Payer: Self-pay

## 2021-06-11 NOTE — Telephone Encounter (Signed)
Returned patients call   Left message for patient to call back and schedule Medicare Annual Wellness Visit (AWV) either virtually or in office.  Left both my jabber number [phone] and office number    Last WTM 11/30/19 ; please schedule at anytime with [facility]    This should be a 45 minute visit.

## 2021-06-11 NOTE — Telephone Encounter (Signed)
I left the pt a message that I was calling him back and that the call he was returning was to schedule his AWV visit.

## 2021-06-18 ENCOUNTER — Telehealth: Payer: Self-pay

## 2021-06-18 ENCOUNTER — Telehealth: Payer: Self-pay | Admitting: Internal Medicine

## 2021-06-18 ENCOUNTER — Telehealth: Payer: Medicare Other

## 2021-06-18 NOTE — Telephone Encounter (Signed)
°  Care Management   Follow Up Note   06/18/2021 Name: William Mcguire MRN: [id number] DOB: 10-09-1955   Referred by: [name], MD Reason for referral : Chronic Care Management (RN CM Follow up call )   A second unsuccessful telephone outreach was attempted today. The patient was referred to the case management team for assistance with care management and care coordination.   Follow Up Plan: Telephone follow up appointment with care management team member scheduled for: 07/10/21  Barb Merino, RN, BSN, CCM Care Management Coordinator [place] [facility]  Direct Phone: [phone]

## 2021-06-18 NOTE — Telephone Encounter (Signed)
Left message for patient to call back and schedule Medicare Annual Wellness Visit (AWV) either virtually or in office.  Left both my jabber number [phone] and office number    Last WTM 11/30/19 please schedule at anytime with [facility]    This should be a 45 minute visit.

## 2021-06-20 ENCOUNTER — Telehealth: Payer: Medicare Other

## 2021-06-20 ENCOUNTER — Ambulatory Visit (INDEPENDENT_AMBULATORY_CARE_PROVIDER_SITE_OTHER): Payer: Medicare Other

## 2021-06-20 DIAGNOSIS — E1122 Type 2 diabetes mellitus with diabetic chronic kidney disease: Secondary | ICD-10-CM

## 2021-06-20 DIAGNOSIS — I129 Hypertensive chronic kidney disease with stage 1 through stage 4 chronic kidney disease, or unspecified chronic kidney disease: Secondary | ICD-10-CM

## 2021-06-20 NOTE — Patient Instructions (Signed)
Visit Information  Thank you for taking time to visit with me today. Please don't hesitate to contact me if I can be of assistance to you before our next scheduled telephone appointment.  Following are the goals we discussed today:  (Copy and paste patient goals from clinical care plan here)  Our next appointment is by telephone on 10/09/21 at 10:30 AM  Please call the care guide team at [phone] if you need to cancel or reschedule your appointment.   If you are experiencing a Mental Health or [place] or need someone to talk to, please call [phone]-TALK (toll free, 24 hour hotline)   Patient verbalizes understanding of instructions provided today and agrees to view in [place].   Barb Merino, RN, BSN, CCM Care Management Coordinator [place] [facility]  Direct Phone: [phone]

## 2021-06-20 NOTE — Chronic Care Management (AMB) (Signed)
Chronic Care Management   CCM RN Visit Note  06/20/2021 Name: William Mcguire MRN: [id number] DOB: Dec 07, 1955  Subjective: William Mcguire is a 65 y.o. year old male who is a primary care patient of [name], MD. The care management team was consulted for assistance with disease management and care coordination needs.    Engaged with patient by telephone for follow up visit in response to provider referral for case management and/or care coordination services.   Consent to Services:  The patient was given information about Chronic Care Management services, agreed to services, and gave verbal consent prior to initiation of services.  Please see initial visit note for detailed documentation.   Patient agreed to services and verbal consent obtained.   Assessment: Review of patient past medical history, allergies, medications, health status, including review of consultants reports, laboratory and other test data, was performed as part of comprehensive evaluation and provision of chronic care management services.   SDOH (Social Determinants of Health) assessments and interventions performed:    CCM Care Plan  No Known Allergies  Outpatient Encounter Medications as of 06/20/2021  Medication Sig   amLODipine (NORVASC) 10 MG tablet Take 1 tablet (10 mg total) by mouth daily.   amlodipine-atorvastatin (CADUET) 10-20 MG tablet 1 tab   aspirin EC 81 MG tablet Take 81 mg by mouth daily.   atorvastatin (LIPITOR) 40 MG tablet Take 1 tablet (40 mg total) by mouth daily.   cyanocobalamin (,VITAMIN B-12,) 1000 MCG/ML injection Inject 1 mL (1,000 mcg total) into the muscle every 30 (thirty) days.   Dulaglutide 1.5 MG/0.5ML SOPN Inject 1.5 mg into the skin once a week.    enalapril (VASOTEC) 10 MG tablet TAKE 1 TABLET BY MOUTH EVERY DAY   glucose blood test strip Use as instructed to test blood sugar 3 times a day. Dx code: e11.65   Insulin Glargine ([name] KWIKPEN) 100 UNIT/ML Inject 0.22  mLs (22 Units total) into the skin at bedtime.   Insulin Pen Needle (B-D ULTRAFINE III SHORT PEN) 31G X 8 MM MISC USE AS DIRECTED   metFORMIN (GLUCOPHAGE) 1000 MG tablet TAKE 1 TABLET BY MOUTH TWICE A DAY WITH MEALS   sildenafil (VIAGRA) 100 MG tablet TAKE 1 TABLET BY MOUTH EVERY DAY AS NEEDED   SYRINGE-NEEDLE, DISP, 3 ML (B-D 3CC LUER-LOK SYR 25GX5/8") 25G X 5/8" 3 ML MISC USE AS DIRECTED   Facility-Administered Encounter Medications as of 06/20/2021  Medication   cyanocobalamin ((VITAMIN B-12)) injection 1,000 mcg    Patient Active Problem List   Diagnosis Date Noted   Class 1 obesity due to excess calories with serious comorbidity and body mass index (BMI) of 33.0 to 33.9 in adult 01/21/2021   Abnormal CT scan of lung 10/14/2020   Hypertensive nephropathy 08/05/2018   Class 1 obesity due to excess calories with serious comorbidity and body mass index (BMI) of 32.0 to 32.9 in adult 08/05/2018   Pyelonephritis 06/23/2011   AKI (acute kidney injury) ([place]) 06/23/2011   Diabetes ([place]) 06/23/2011   HTN (hypertension) 06/23/2011    Conditions to be addressed/monitored: DM II with stage 2 CKD, HTN  Care Plan : RN Care Manager Plan of Care  Updates made by Lynne Logan, RN since 06/20/2021 12:00 AM     Problem: No plan of care established for management of chronic disease states (DM II with stage 2 CKD, HTN)   Priority: High     Long-Range Goal: Establishment of plan of care for management of  chronic disease states (DM II with stage 2 CKD, HTN)   Start Date: 06/20/2021  Expected End Date: 06/20/2022  This Visit's Progress: On track  Priority: High  Note:   Current Barriers:  Knowledge Deficits related to plan of care for management of DM II with stage 2 CKD, HTN  Chronic Disease Management support and education needs related to DM II with stage 2 CKD, HTN   RNCM Clinical Goal(s):  Patient will verbalize basic understanding of  DM II with stage 2 CKD, HTN  disease process and  self health management plan as evidenced by patient will report having no disease exacerbations related chronic disease states as listed above take all medications exactly as prescribed and will call provider for medication related questions as evidenced by patient will report having no missed doses of his prescribed medications  demonstrate Ongoing health management independence as evidenced by patient will report 100% adherence to his prescribed treatment plan  continue to work with RN Care Manager to address care management and care coordination needs related to  DM II with stage 2 CKD, HTN  as evidenced by adherence to CM Team Scheduled appointments demonstrate ongoing self health care management ability   as evidenced by    through collaboration with RN Care manager, provider, and care team.   Interventions: 1:1 collaboration with primary care provider regarding development and update of comprehensive plan of care as evidenced by provider attestation and co-signature Inter-disciplinary care team collaboration (see longitudinal plan of care) Evaluation of current treatment plan related to  self management and patient's adherence to plan as established by provider  Diabetes Interventions:  (Status:  Goal on track:  Yes.) Long Term Goal Assessed patient's understanding of A1c goal: <6.5% Provided education to patient about basic DM disease process Reviewed medications with patient and discussed importance of medication adherence Counseled on importance of regular laboratory monitoring as prescribed Advised patient, providing education and rationale, to check cbg daily before breakfast and record, calling PCP and or RN CM for findings outside established parameters Review of patient status, including review of consultants reports, relevant laboratory and other test results, and medications completed Assessed social determinant of health barriers Discussed plans with patient for ongoing care  management follow up and provided patient with direct contact information for care management team Lab Results  Component Value Date   HGBA1C 6.3 (H) 06/04/2021   Hypertension Interventions:  (Status:  Goal on track:  Yes.) Long Term Goal Last practice recorded BP readings:  BP Readings from Last 3 Encounters:  06/04/21 126/78  01/21/21 138/80  10/09/20 (!) 144/72  Most recent eGFR/CrCl:  Lab Results  Component Value Date   EGFR 66 06/04/2021    No components found for: CRCL  Evaluation of current treatment plan related to hypertension self management and patient's adherence to plan as established by provider Provided education to patient re: stroke prevention, s/s of heart attack and stroke Reviewed medications with patient and discussed importance of compliance Counseled on the importance of exercise goals with target of 150 minutes per week Advised patient, providing education and rationale, to monitor blood pressure daily and record, calling PCP for findings outside established parameters Provided education on prescribed diet low Sodium Mailed printed educational materials related to How to Accurately Monitor BP at Home  Discussed plans with patient for ongoing care management follow up and provided patient with direct contact information for care management team  Patient Goals/Self-Care Activities: Take all medications as prescribed Attend all scheduled  provider appointments Call pharmacy for medication refills 3-7 days in advance of running out of medications Perform all self care activities independently  Perform IADL's (shopping, preparing meals, housekeeping, managing finances) independently Call provider office for new concerns or questions  drink 6 to 8 glasses of water each day fill half of plate with vegetables manage portion size take blood pressure log to all doctor appointments call doctor for signs and symptoms of high blood pressure take medications for blood  pressure exactly as prescribed report new symptoms to your doctor  Follow Up Plan:  Telephone follow up appointment with care management team member scheduled for:  10/09/21      Plan:Telephone follow up appointment with care management team member scheduled for:  10/09/21  Barb Merino, RN, BSN, CCM Care Management Coordinator [place] [facility]  Direct Phone: [phone]

## 2021-06-22 DIAGNOSIS — E1122 Type 2 diabetes mellitus with diabetic chronic kidney disease: Secondary | ICD-10-CM

## 2021-06-22 DIAGNOSIS — Z794 Long term (current) use of insulin: Secondary | ICD-10-CM | POA: Diagnosis not present

## 2021-06-22 DIAGNOSIS — I129 Hypertensive chronic kidney disease with stage 1 through stage 4 chronic kidney disease, or unspecified chronic kidney disease: Secondary | ICD-10-CM | POA: Diagnosis not present

## 2021-06-22 DIAGNOSIS — N182 Chronic kidney disease, stage 2 (mild): Secondary | ICD-10-CM | POA: Diagnosis not present

## 2021-07-03 ENCOUNTER — Telehealth: Payer: Self-pay

## 2021-07-03 NOTE — Telephone Encounter (Signed)
I called patient and left him a voicemail. He brought in his stool cards but there was no date on them so im unable to put them in at this time. Stool cards were negative. YL,RMA

## 2021-07-10 ENCOUNTER — Telehealth: Payer: Medicare Other

## 2021-07-11 ENCOUNTER — Telehealth: Payer: Self-pay

## 2021-07-11 NOTE — Chronic Care Management (AMB) (Signed)
Chronic Care Management Pharmacy Assistant   Name: Caysen Whang  MRN: [id number] DOB: [birth year]   Reason for Encounter: Disease State/ Diabetes  Recent office visits:  06-20-2021 Lynne Logan, RN (CCM)  06-04-2021 [name], MD. Flu vaccine given. B12 injection given. Hemo= 12.3, Hema= 37.4. A1C= 6.3  Recent consult visits:  None  Hospital visits:  None in previous 6 months  Medications: Outpatient Encounter Medications as of 07/11/2021  Medication Sig   amLODipine (NORVASC) 10 MG tablet Take 1 tablet (10 mg total) by mouth daily.   amlodipine-atorvastatin (CADUET) 10-20 MG tablet 1 tab   aspirin EC 81 MG tablet Take 81 mg by mouth daily.   atorvastatin (LIPITOR) 40 MG tablet Take 1 tablet (40 mg total) by mouth daily.   cyanocobalamin (,VITAMIN B-12,) 1000 MCG/ML injection Inject 1 mL (1,000 mcg total) into the muscle every 30 (thirty) days.   Dulaglutide 1.5 MG/0.5ML SOPN Inject 1.5 mg into the skin once a week.    enalapril (VASOTEC) 10 MG tablet TAKE 1 TABLET BY MOUTH EVERY DAY   glucose blood test strip Use as instructed to test blood sugar 3 times a day. 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   Insulin Pen Needle (B-D ULTRAFINE III SHORT PEN) 31G X 8 MM MISC USE AS DIRECTED   metFORMIN (GLUCOPHAGE) 1000 MG tablet TAKE 1 TABLET BY MOUTH TWICE A DAY WITH MEALS   sildenafil (VIAGRA) 100 MG tablet TAKE 1 TABLET BY MOUTH EVERY DAY AS NEEDED   SYRINGE-NEEDLE, DISP, 3 ML (B-D 3CC LUER-LOK SYR 25GX5/8") 25G X 5/8" 3 ML MISC USE AS DIRECTED   Facility-Administered Encounter Medications as of 07/11/2021  Medication   cyanocobalamin ((VITAMIN B-12)) injection 1,000 mcg   Recent Relevant Labs: Lab Results  Component Value Date/Time   HGBA1C 6.3 (H) 06/04/2021 11:14 AM   HGBA1C 6.9 (H) 01/21/2021 09:23 AM   MICROALBUR 30 01/21/2021 12:44 PM   MICROALBUR 30 11/30/2019 09:55 AM     Kidney Function Lab Results  Component Value Date/Time   CREATININE 1.20 06/04/2021 11:14 AM   CREATININE 1.22 01/21/2021 09:23 AM   GFRNONAA 61 08/06/2020 10:44 AM   GFRAA 70 08/06/2020 10:44 AM    Current antihyperglycemic regimen:  Dulaglutide 1.5 mg weekly [name] Claiborne Rigg - take as directed  Metformin 1000 mg tablet twice daily  What recent interventions/DTPs have been made to improve glycemic control:  -Educated on A1c and blood sugar goals; Exercise goal of 150 minutes per week; -Counseled to check feet daily and get yearly eye exams -Spoke to patient about the patient assistance paperwork being completed and filled out  -Patient  -Counseled on diet and exercise extensively Recommended to continue current medication  Have there been any recent hospitalizations or ED visits since last visit with CPP? No  Patient denies hypoglycemic symptoms  Patient denies hyperglycemic symptoms  How often are you checking your blood sugar? once daily  What are your blood sugars ranging?  Fasting: 80, 90, 80 Before meals: None After meals: None Bedtime: None  During the week, how often does your blood glucose drop below 70? Never  Are you checking your feet daily/regularly? Patient stated daily  Adherence Review: Is the patient currently on a STATIN medication? No Is the patient currently on ACE/ARB medication? Yes Does the patient have >5 day gap between last estimated fill dates? No  Care Gaps: Shingrix overdue PNA  Vac overdue  Star Rating Drugs: Metformin 1000 mg- Last filled [id number] [facility] Trulicity 1.5 mg- Patient assistance Enalapril 10 mg- Last filled 06-27-2021 [facility] Atorvastatin 40 mg- Last filled 05-21-2021 [facility]  [place] Clinical Pharmacist Assistant [phone]

## 2021-07-16 ENCOUNTER — Telehealth: Payer: Self-pay | Admitting: Internal Medicine

## 2021-07-16 NOTE — Telephone Encounter (Signed)
Left message for patient on both #'s to call back and schedule Medicare Annual Wellness Visit (AWV) either virtually or in office.  Left  my Herbie Drape number [phone]   Last WTM 11/30/19; please schedule at anytime with [facility]    This should be a 45 minute visit.

## 2021-07-19 ENCOUNTER — Other Ambulatory Visit: Payer: Self-pay | Admitting: Internal Medicine

## 2021-08-05 ENCOUNTER — Telehealth: Payer: Self-pay | Admitting: Internal Medicine

## 2021-08-05 NOTE — Telephone Encounter (Signed)
Left message for patient to call back and schedule Medicare Annual Wellness Visit (AWV) either virtually or in office.  Left both my jabber number [phone] and office number    Last WTM 11/30/19 please schedule at anytime with [facility]    This should be a 45 minute visit.

## 2021-08-07 ENCOUNTER — Ambulatory Visit: Payer: Medicare Other

## 2021-08-07 ENCOUNTER — Telehealth: Payer: Self-pay

## 2021-08-07 NOTE — Telephone Encounter (Signed)
This nurse attempted to call patient three times for virtual AWV. Message left that we will call again to reschedule for another time.

## 2021-08-13 ENCOUNTER — Other Ambulatory Visit: Payer: Self-pay | Admitting: Internal Medicine

## 2021-08-15 ENCOUNTER — Telehealth: Payer: Self-pay | Admitting: Internal Medicine

## 2021-08-15 NOTE — Telephone Encounter (Signed)
Left message for patient to call back and schedule Medicare Annual Wellness Visit (AWV) either virtually or in office.  Left both my jabber number [phone] and office number    Last WTM 11/30/19 please schedule at anytime with [facility]    This should be a 45 minute visit.

## 2021-08-27 ENCOUNTER — Telehealth: Payer: Self-pay | Admitting: Internal Medicine

## 2021-08-27 NOTE — Telephone Encounter (Signed)
Left message for patient to call back and schedule Medicare Annual Wellness Visit (AWV) either virtually or in office.  Left both my jabber number [phone] and office number  ? ? ?Last  WTM 11/30/19 ?please schedule at anytime with [facility]  ? ? ?This should be a 45 minute visit.  ?

## 2021-09-12 ENCOUNTER — Telehealth: Payer: Medicare Other

## 2021-09-13 ENCOUNTER — Telehealth: Payer: Medicare Other

## 2021-09-22 ENCOUNTER — Other Ambulatory Visit: Payer: Self-pay | Admitting: Internal Medicine

## 2021-09-25 ENCOUNTER — Telehealth: Payer: Self-pay

## 2021-09-25 ENCOUNTER — Ambulatory Visit: Payer: Medicare Other

## 2021-09-25 NOTE — Telephone Encounter (Signed)
This nurse attempted to call patient three times for scheduled telephonic AWV. Message left that we will call him to reschedule or he can call us. ?

## 2021-10-02 ENCOUNTER — Telehealth: Payer: Self-pay | Admitting: Internal Medicine

## 2021-10-02 NOTE — Telephone Encounter (Signed)
Left message for patient to call back and schedule Medicare Annual Wellness Visit (AWV) either virtually or in office.  Left both my jabber number [phone] and office number  ? ? ?Last WTM 11/30/19 ? please schedule at anytime with [facility]  ? ? ?This should be a 45 minute visit.  ?

## 2021-10-07 ENCOUNTER — Encounter: Payer: Self-pay | Admitting: Internal Medicine

## 2021-10-07 ENCOUNTER — Ambulatory Visit (INDEPENDENT_AMBULATORY_CARE_PROVIDER_SITE_OTHER): Payer: Medicare Other | Admitting: Internal Medicine

## 2021-10-07 VITALS — BP 132/70 | HR 67 | Temp 97.7°F | Ht 72.24 in | Wt 245.8 lb

## 2021-10-07 DIAGNOSIS — E6609 Other obesity due to excess calories: Secondary | ICD-10-CM

## 2021-10-07 DIAGNOSIS — I129 Hypertensive chronic kidney disease with stage 1 through stage 4 chronic kidney disease, or unspecified chronic kidney disease: Secondary | ICD-10-CM

## 2021-10-07 DIAGNOSIS — Z23 Encounter for immunization: Secondary | ICD-10-CM | POA: Diagnosis not present

## 2021-10-07 DIAGNOSIS — D649 Anemia, unspecified: Secondary | ICD-10-CM | POA: Diagnosis not present

## 2021-10-07 DIAGNOSIS — Z794 Long term (current) use of insulin: Secondary | ICD-10-CM | POA: Diagnosis not present

## 2021-10-07 DIAGNOSIS — E1122 Type 2 diabetes mellitus with diabetic chronic kidney disease: Secondary | ICD-10-CM | POA: Diagnosis not present

## 2021-10-07 DIAGNOSIS — N182 Chronic kidney disease, stage 2 (mild): Secondary | ICD-10-CM | POA: Diagnosis not present

## 2021-10-07 DIAGNOSIS — Z6833 Body mass index (BMI) 33.0-33.9, adult: Secondary | ICD-10-CM

## 2021-10-07 NOTE — Patient Instructions (Signed)

## 2021-10-07 NOTE — Progress Notes (Signed)
?[name],acting as a Education administrator for [name], MD.,have documented all relevant documentation on the behalf of [name], MD,as directed by  [name], MD while in the presence of [name], MD.  ?This visit occurred during the SARS-CoV-2 public health emergency.  Safety protocols were in place, including screening questions prior to the visit, additional usage of staff PPE, and extensive cleaning of exam room while observing appropriate contact time as indicated for disinfecting solutions. ? ?Subjective:  ?  ? Patient ID: William Mcguire , male    DOB: 05-03-1956 , 66 y.o.   MRN: [id number] ? ? ?Chief Complaint  ?Patient presents with  ? Diabetes  ? Hypertension  ? ? ?HPI ? ?He is here today for diabetes/HTN f/u.  He reports compliance with meds. He denies headaches, chest pain and shortness of breath. Patient reports he received his vitamin B12 injection at the beginning of the month.  ? ?Diabetes ?He presents for his follow-up diabetic visit. He has type 2 diabetes mellitus. His disease course has been stable. There are no hypoglycemic associated symptoms. Pertinent negatives for diabetes include no blurred vision, no chest pain, no polydipsia, no polyphagia and no polyuria. There are no hypoglycemic complications. Diabetic complications include nephropathy. Risk factors for coronary artery disease include diabetes mellitus, dyslipidemia, hypertension, male sex and sedentary lifestyle. His breakfast blood glucose is taken between 8-9 am. His breakfast blood glucose range is generally 90-110 mg/dl.  ?Hypertension ?This is a chronic problem. The current episode started more than 1 year ago. The problem has been gradually improving since onset. The problem is controlled. Pertinent negatives include no blurred vision, chest pain, palpitations or shortness of breath.   ? ?Past Medical History:  ?Diagnosis Date  ? Diabetes mellitus   ? High cholesterol   ? Hypertension   ?  ? ?Family  History  ?Problem Relation Age of Onset  ? Hypertension Mother   ? Multiple myeloma Mother   ? Hypertension Father   ? Diabetes Father   ? ? ? ?Current Outpatient Medications:  ?  aspirin EC 81 MG tablet, Take 81 mg by mouth daily., Disp: , Rfl:  ?  atorvastatin (LIPITOR) 40 MG tablet, Take 1 tablet (40 mg total) by mouth daily., Disp: 90 tablet, Rfl: 1 ?  B-D ULTRAFINE III SHORT PEN 31G X 8 MM MISC, USE AS DIRECTED, Disp: 100 each, Rfl: PRN ?  cyanocobalamin (,VITAMIN B-12,) 1000 MCG/ML injection, Inject 1 mL (1,000 mcg total) into the muscle every 30 (thirty) days., Disp: 9 mL, Rfl: 1 ?  Dulaglutide 1.5 MG/0.5ML SOPN, Inject 1.5 mg into the skin once a week. , Disp: , Rfl:  ?  enalapril (VASOTEC) 10 MG tablet, TAKE 1 TABLET BY MOUTH EVERY DAY, Disp: 90 tablet, Rfl: 2 ?  glucose blood test strip, Use as instructed to test blood sugar 3 times a day. Dx code: e11.65, Disp: 100 each, Rfl: 12 ?  Insulin Glargine ([name] KWIKPEN) 100 UNIT/ML, Inject 0.22 mLs (22 Units total) into the skin at bedtime., Disp: 45 mL, Rfl: 3 ?  metFORMIN (GLUCOPHAGE) 1000 MG tablet, TAKE 1 TABLET BY MOUTH TWICE A DAY WITH MEALS, Disp: 180 tablet, Rfl: 0 ?  sildenafil (VIAGRA) 100 MG tablet, TAKE 1 TABLET BY MOUTH EVERY DAY AS NEEDED, Disp: 10 tablet, Rfl: 5 ?  SYRINGE-NEEDLE, DISP, 3 ML (B-D 3CC LUER-LOK SYR 25GX5/8") 25G X 5/8" 3 ML MISC, USE AS DIRECTED, Disp: 12 each, Rfl: 24 ?  amLODipine (  NORVASC) 10 MG tablet, Take 1 tablet (10 mg total) by mouth daily., Disp: 90 tablet, Rfl: 1 ? ?Current Facility-Administered Medications:  ?  cyanocobalamin ((VITAMIN B-12)) injection 1,000 mcg, 1,000 mcg, Intramuscular, Once, [name], MD  ? ?No Known Allergies  ? ?Review of Systems  ?Constitutional: Negative.   ?Eyes: Negative.  Negative for blurred vision.  ?Respiratory: Negative.  Negative for shortness of breath.   ?Cardiovascular: Negative.  Negative for chest pain and palpitations.  ?Gastrointestinal: Negative.   ?Endocrine: Negative  for polydipsia, polyphagia and polyuria.  ?Musculoskeletal: Negative.   ?Skin: Negative.   ?Neurological: Negative.   ?Psychiatric/Behavioral: Negative.     ? ?Today's Vitals  ? 10/07/21 1136  ?BP: 132/70  ?Pulse: 67  ?Temp: 97.7 ?F (36.5 ?C)  ?Weight: 245 lb 12.8 oz (111.5 kg)  ?Height: 6' 0.24" (1.835 m)  ?PainSc: 0-No pain  ? ?Body mass index is 33.11 kg/m?.  ?Wt Readings from Last 3 Encounters:  ?10/07/21 245 lb 12.8 oz (111.5 kg)  ?06/04/21 245 lb (111.1 kg)  ?01/21/21 250 lb 9.6 oz (113.7 kg)  ?  ? ?Objective:  ?Physical Exam ?Vitals and nursing note reviewed.  ?Constitutional:   ?   Appearance: Normal appearance.  ?HENT:  ?   Head: Normocephalic and atraumatic.  ?Eyes:  ?   Extraocular Movements: Extraocular movements intact.  ?Cardiovascular:  ?   Rate and Rhythm: Normal rate and regular rhythm.  ?   Heart sounds: Normal heart sounds.  ?Pulmonary:  ?   Effort: Pulmonary effort is normal.  ?   Breath sounds: Normal breath sounds.  ?Musculoskeletal:  ?   Cervical back: Normal range of motion.  ?Skin: ?   General: Skin is warm.  ?Neurological:  ?   General: No focal deficit present.  ?   Mental Status: He is alert.  ?Psychiatric:     ?   Mood and Affect: Mood normal.  ?   ?Assessment And Plan:  ?   ?1. Type 2 diabetes mellitus with stage 2 chronic kidney disease, with long-term current use of insulin ([place]) ?Comments: Chronic, I will check labs as below. Importance of dietary/exercise compliance was d/w patient. He will c/w Trulicity, metformin and [name]. F/u 4 months.  ?- CMP14+EGFR ?- CBC no Diff ?- Hemoglobin A1c ? ?2. Hypertensive nephropathy ?Comments: Chronic, fair control. Goal BP<130/80. Advised to follow low sodium diet.  ? ?3. Class 1 obesity due to excess calories with serious comorbidity and body mass index (BMI) of 33.0 to 33.9 in adult ?Comments: He is encouraged to aim for at least 150 minutes of exercise per week, while striving for BMI<30 to decrease cardiac risk.  ? ?4. Immunization due ?-  Pneumococcal conjugate vaccine 20-valent (Prevnar-20) ?  ?Patient was given opportunity to ask questions. Patient verbalized understanding of the plan and was able to repeat key elements of the plan. All questions were answered to their satisfaction.  ? ?I, [name], MD, have reviewed all documentation for this visit. The documentation on 10/07/21 for the exam, diagnosis, procedures, and orders are all accurate and complete.  ? ? ?IF YOU HAVE BEEN REFERRED TO A SPECIALIST, IT MAY TAKE 1-2 WEEKS TO SCHEDULE/PROCESS THE REFERRAL. IF YOU HAVE NOT HEARD FROM US/SPECIALIST IN TWO WEEKS, PLEASE GIVE [place] A CALL AT [phone] X [phone].  ? ?THE PATIENT IS ENCOURAGED TO PRACTICE SOCIAL DISTANCING DUE TO THE COVID-19 PANDEMIC.   ?

## 2021-10-08 LAB — CMP14+EGFR
ALT: 19 IU/L (ref 0–44)
AST: 23 IU/L (ref 0–40)
Albumin/Globulin Ratio: 1.5 (ref 1.2–2.2)
Albumin: 4.5 g/dL (ref 3.8–4.8)
Alkaline Phosphatase: 110 IU/L (ref 44–121)
BUN/Creatinine Ratio: 10 (ref 10–24)
BUN: 12 mg/dL (ref 8–27)
Bilirubin Total: 0.3 mg/dL (ref 0.0–1.2)
CO2: 23 mmol/L (ref 20–29)
Calcium: 9.8 mg/dL (ref 8.6–10.2)
Chloride: 108 mmol/L — ABNORMAL HIGH (ref 96–106)
Creatinine, Ser: 1.15 mg/dL (ref 0.76–1.27)
Globulin, Total: 3.1 g/dL (ref 1.5–4.5)
Glucose: 93 mg/dL (ref 70–99)
Potassium: 5.1 mmol/L (ref 3.5–5.2)
Sodium: 143 mmol/L (ref 134–144)
Total Protein: 7.6 g/dL (ref 6.0–8.5)
eGFR: 70 mL/min/{1.73_m2} (ref >59)

## 2021-10-08 LAB — HEMOGLOBIN A1C
Est. average glucose Bld gHb Est-mCnc: 143 mg/dL
Hgb A1c MFr Bld: 6.6 % — ABNORMAL HIGH (ref 4.8–5.6)

## 2021-10-08 LAB — CBC
Hematocrit: 37.9 % (ref 37.5–51.0)
Hemoglobin: 12.6 g/dL — ABNORMAL LOW (ref 13.0–17.7)
MCH: 28.3 pg (ref 26.6–33.0)
MCHC: 33.2 g/dL (ref 31.5–35.7)
MCV: 85 fL (ref 79–97)
Platelets: 219 10*3/uL (ref 150–450)
RBC: 4.45 x10E6/uL (ref 4.14–5.80)
RDW: 13.3 % (ref 11.6–15.4)
WBC: 4.8 10*3/uL (ref 3.4–10.8)

## 2021-10-09 ENCOUNTER — Telehealth: Payer: Self-pay

## 2021-10-09 ENCOUNTER — Telehealth: Payer: Medicare Other

## 2021-10-09 LAB — SPECIMEN STATUS REPORT

## 2021-10-09 LAB — VITAMIN B12: Vitamin B-12: 544 pg/mL (ref 232–1245)

## 2021-10-09 NOTE — Telephone Encounter (Signed)
?  Care Management  ? ?Follow Up Note ? ? ?10/09/2021 ?Name: William Mcguire MRN: [id number] DOB: [birth year] ? ? ?Referred by: [name], MD ?Reason for referral : Chronic Care Management (RN CM Follow up call ) ? ? ?An unsuccessful telephone outreach was attempted today. The patient was referred to the case management team for assistance with care management and care coordination.  ? ?Follow Up Plan: A HIPPA compliant phone message was left for the patient providing contact information and requesting a return call.  ? ?Barb Merino, RN, BSN, CCM ?Care Management Coordinator ?[place] [facility]  ?Direct Phone: [phone] ? ? ?

## 2021-10-16 ENCOUNTER — Telehealth: Payer: Self-pay | Admitting: Internal Medicine

## 2021-10-16 NOTE — Telephone Encounter (Signed)
Left message for patient to call back and schedule Medicare Annual Wellness Visit (AWV) in office. ? ?If unable to come into the office for AWV,  please offer to do virtually or by telephone. ? ?Last WTM  11/30/19 scheduled AWVI  ? ?Please schedule at anytime with [facility]. ? ? ? ?30 minute appointment for Virtual or phone ?45 minute appointment for in office or Initial virtual/phone ? ? ? ?Any questions, please contact me at [phone]   ?

## 2021-10-20 ENCOUNTER — Other Ambulatory Visit: Payer: Self-pay | Admitting: Internal Medicine

## 2021-10-23 ENCOUNTER — Telehealth: Payer: Self-pay

## 2021-10-23 ENCOUNTER — Telehealth: Payer: Medicare Other

## 2021-10-23 NOTE — Telephone Encounter (Addendum)
?  Care Management  ? ?Follow Up Note ? ? ?10/23/2021 ?Name: William Mcguire MRN: [id number] DOB: Aug 16, 1955 ? ? ?Referred by: [name], MD ?Reason for referral : No chief complaint on file. ? ? ?A second unsuccessful telephone outreach was attempted today. The patient was referred to the case management team for assistance with care management and care coordination.  ? ?Follow Up Plan: A HIPPA compliant phone message was left for the patient providing contact information and requesting a return call.  ? ?Barb Merino, RN, BSN, CCM ?Care Management Coordinator ?[place] [facility]  ?Direct Phone: [phone] ? ? ?

## 2021-10-24 ENCOUNTER — Ambulatory Visit: Payer: Medicare Other

## 2021-10-24 ENCOUNTER — Telehealth: Payer: Self-pay

## 2021-10-24 NOTE — Telephone Encounter (Signed)
This nurse called patient three times for scheduled telephonic AWV. Messages left to call back to schedule for another time. ?

## 2021-10-29 ENCOUNTER — Telehealth: Payer: Self-pay | Admitting: Internal Medicine

## 2021-10-29 NOTE — Telephone Encounter (Signed)
Left message for patient to call back and schedule Medicare Annual Wellness Visit (AWV) either virtually or in office.  Left both my jabber number [phone] and office number  ? ? ?Last WTM 11/30/19 ? please schedule at anytime with [facility]  ? ? ?This should be a 45 minute visit.  ?

## 2021-10-30 ENCOUNTER — Ambulatory Visit (INDEPENDENT_AMBULATORY_CARE_PROVIDER_SITE_OTHER): Payer: Medicare Other

## 2021-10-30 VITALS — Ht 75.0 in | Wt 245.0 lb

## 2021-10-30 DIAGNOSIS — Z0000 Encounter for general adult medical examination without abnormal findings: Secondary | ICD-10-CM

## 2021-10-30 NOTE — Patient Instructions (Signed)
Mr. Skidmore , ?Thank you for taking time to come for your Medicare Wellness Visit. I appreciate your ongoing commitment to your health goals. Please review the following plan we discussed and let me know if I can assist you in the future.  ? ?Screening recommendations/referrals: ?Colonoscopy: completed 08/23/2019, due 08/22/2024 ?Recommended yearly ophthalmology/optometry visit for glaucoma screening and checkup ?Recommended yearly dental visit for hygiene and checkup ? ?Vaccinations: ?Influenza vaccine: due 01/21/2022 ?Pneumococcal vaccine: completed 10/07/2021 ?Tdap vaccine: completed 12/14/2014, due 12/13/2024 ?Shingles vaccine: discussed   ?Covid-19:  05/13/2021, 05/01/2020, 09/28/2019, 09/10/2019,  ? ?Advanced directives: Please bring a copy of your POA (Power of Attorney) and/or Living Will to your next appointment.  ? ?Conditions/risks identified: none ? ?Next appointment: Follow up in one year for your annual wellness visit.  ? ?Preventive Care 30 Years and Older, Male ?Preventive care refers to lifestyle choices and visits with your health care provider that can promote health and wellness. ?What does preventive care include? ?A yearly physical exam. This is also called an annual well check. ?Dental exams once or twice a year. ?Routine eye exams. Ask your health care provider how often you should have your eyes checked. ?Personal lifestyle choices, including: ?Daily care of your teeth and gums. ?Regular physical activity. ?Eating a healthy diet. ?Avoiding tobacco and drug use. ?Limiting alcohol use. ?Practicing safe sex. ?Taking low doses of aspirin every day. ?Taking vitamin and mineral supplements as recommended by your health care provider. ?What happens during an annual well check? ?The services and screenings done by your health care provider during your annual well check will depend on your age, overall health, lifestyle risk factors, and family history of disease. ?Counseling  ?Your health care provider may ask  you questions about your: ?Alcohol use. ?Tobacco use. ?Drug use. ?Emotional well-being. ?Home and relationship well-being. ?Sexual activity. ?Eating habits. ?History of falls. ?Memory and ability to understand (cognition). ?Work and work Statistician. ?Screening  ?You may have the following tests or measurements: ?Height, weight, and BMI. ?Blood pressure. ?Lipid and cholesterol levels. These may be checked every 5 years, or more frequently if you are over 53 years old. ?Skin check. ?Lung cancer screening. You may have this screening every year starting at age 74 if you have a 30-pack-year history of smoking and currently smoke or have quit within the past 15 years. ?Fecal occult blood test (FOBT) of the stool. You may have this test every year starting at age 19. ?Flexible sigmoidoscopy or colonoscopy. You may have a sigmoidoscopy every 5 years or a colonoscopy every 10 years starting at age 20. ?Prostate cancer screening. Recommendations will vary depending on your family history and other risks. ?Hepatitis C blood test. ?Hepatitis B blood test. ?Sexually transmitted disease (STD) testing. ?Diabetes screening. This is done by checking your blood sugar (glucose) after you have not eaten for a while (fasting). You may have this done every 1-3 years. ?Abdominal aortic aneurysm (AAA) screening. You may need this if you are a current or former smoker. ?Osteoporosis. You may be screened starting at age 51 if you are at high risk. ?Talk with your health care provider about your test results, treatment options, and if necessary, the need for more tests. ?Vaccines  ?Your health care provider may recommend certain vaccines, such as: ?Influenza vaccine. This is recommended every year. ?Tetanus, diphtheria, and acellular pertussis (Tdap, Td) vaccine. You may need a Td booster every 10 years. ?Zoster vaccine. You may need this after age 63. ?Pneumococcal 13-valent conjugate (PCV13) vaccine.  One dose is recommended after age  35. ?Pneumococcal polysaccharide (PPSV23) vaccine. One dose is recommended after age 26. ?Talk to your health care provider about which screenings and vaccines you need and how often you need them. ?This information is not intended to replace advice given to you by your health care provider. Make sure you discuss any questions you have with your health care provider. ?Document Released: 07/06/2015 Document Revised: 02/27/2016 Document Reviewed: 04/10/2015 ?Elsevier Interactive Patient Education ? 2017 [place]. ? ?Fall Prevention in the Home ?Falls can cause injuries. They can happen to people of all ages. There are many things you can do to make your home safe and to help prevent falls. ?What can I do on the outside of my home? ?Regularly fix the edges of walkways and driveways and fix any cracks. ?Remove anything that might make you trip as you walk through a door, such as a raised step or threshold. ?Trim any bushes or trees on the path to your home. ?Use bright outdoor lighting. ?Clear any walking paths of anything that might make someone trip, such as rocks or tools. ?Regularly check to see if handrails are loose or broken. Make sure that both sides of any steps have handrails. ?Any raised decks and porches should have guardrails on the edges. ?Have any leaves, snow, or ice cleared regularly. ?Use sand or salt on walking paths during winter. ?Clean up any spills in your garage right away. This includes oil or grease spills. ?What can I do in the bathroom? ?Use night lights. ?Install grab bars by the toilet and in the tub and shower. Do not use towel bars as grab bars. ?Use non-skid mats or decals in the tub or shower. ?If you need to sit down in the shower, use a plastic, non-slip stool. ?Keep the floor dry. Clean up any water that spills on the floor as soon as it happens. ?Remove soap buildup in the tub or shower regularly. ?Attach bath mats securely with double-sided non-slip rug tape. ?Do not have throw  rugs and other things on the floor that can make you trip. ?What can I do in the bedroom? ?Use night lights. ?Make sure that you have a light by your bed that is easy to reach. ?Do not use any sheets or blankets that are too big for your bed. They should not hang down onto the floor. ?Have a firm chair that has side arms. You can use this for support while you get dressed. ?Do not have throw rugs and other things on the floor that can make you trip. ?What can I do in the kitchen? ?Clean up any spills right away. ?Avoid walking on wet floors. ?Keep items that you use a lot in easy-to-reach places. ?If you need to reach something above you, use a strong step stool that has a grab bar. ?Keep electrical cords out of the way. ?Do not use floor polish or wax that makes floors slippery. If you must use wax, use non-skid floor wax. ?Do not have throw rugs and other things on the floor that can make you trip. ?What can I do with my stairs? ?Do not leave any items on the stairs. ?Make sure that there are handrails on both sides of the stairs and use them. Fix handrails that are broken or loose. Make sure that handrails are as long as the stairways. ?Check any carpeting to make sure that it is firmly attached to the stairs. Fix any carpet that is loose or  worn. ?Avoid having throw rugs at the top or bottom of the stairs. If you do have throw rugs, attach them to the floor with carpet tape. ?Make sure that you have a light switch at the top of the stairs and the bottom of the stairs. If you do not have them, ask someone to add them for you. ?What else can I do to help prevent falls? ?Wear shoes that: ?Do not have high heels. ?Have rubber bottoms. ?Are comfortable and fit you well. ?Are closed at the toe. Do not wear sandals. ?If you use a stepladder: ?Make sure that it is fully opened. Do not climb a closed stepladder. ?Make sure that both sides of the stepladder are locked into place. ?Ask someone to hold it for you, if  possible. ?Clearly mark and make sure that you can see: ?Any grab bars or handrails. ?First and last steps. ?Where the edge of each step is. ?Use tools that help you move around (mobility aids) if they are nee

## 2021-10-30 NOTE — Progress Notes (Signed)
?I connected with William Mcguire today by telephone and verified that I am speaking with the correct person using two identifiers. ?Location patient: home ?Location provider: work ?Persons participating in the virtual visit: Zanden, Colver LPN. ?  ?I discussed the limitations, risks, security and privacy concerns of performing an evaluation and management service by telephone and the availability of in person appointments. I also discussed with the patient that there may be a patient responsible charge related to this service. The patient expressed understanding and verbally consented to this telephonic visit.  ?  ?Interactive audio and video telecommunications were attempted between this provider and patient, however failed, due to patient having technical difficulties OR patient did not have access to video capability.  We continued and completed visit with audio only. ? ?  ? ?Vital signs may be patient reported or missing. ? ?Subjective:  ? William Mcguire is a 66 y.o. male who presents for an Initial Medicare Annual Wellness Visit. ? ?Review of Systems    ? ?Cardiac Risk Factors include: advanced age (>74mn, >>48women);diabetes mellitus;hypertension;male gender;obesity (BMI >30kg/m2) ? ?   ?Objective:  ?  ?Today's Vitals  ? 10/30/21 1216  ?Weight: 245 lb (111.1 kg)  ?Height: _0  (1.905 m)  ? ?Body mass index is 30.62 kg/m?. ? ? ?  10/30/2021  ? 12:21 PM 11/30/2019  ? 10:19 AM 11/30/2019  ?  9:43 AM 06/11/2011  ? 11:34 AM  ?Advanced Directives  ?Does Patient Have a Medical Advance Directive? Yes No Yes Patient does not have advance directive;Patient would not like information  ?Type of Advance Directive Living will Healthcare Power of [place] will   ?Does patient want to make changes to medical advance directive?  Yes (MAU/Ambulatory/Procedural Areas - Information given) No - Patient declined   ?[place]?  No - copy  requested No - copy requested   ?Would patient like information on creating a medical advance directive?  No - Patient declined    ? ? ?Current Medications (verified) ?Outpatient Encounter Medications as of 10/30/2021  ?Medication Sig  ? aspirin EC 81 MG tablet Take 81 mg by mouth daily.  ? atorvastatin (LIPITOR) 40 MG tablet Take 1 tablet (40 mg total) by mouth daily.  ? B-D ULTRAFINE III SHORT PEN 31G X 8 MM MISC USE AS DIRECTED  ? cyanocobalamin (,VITAMIN B-12,) 1000 MCG/ML injection Inject 1 mL (1,000 mcg total) into the muscle every 30 (thirty) days.  ? Dulaglutide 1.5 MG/0.5ML SOPN Inject 1.5 mg into the skin once a week.   ? enalapril (VASOTEC) 10 MG tablet TAKE 1 TABLET BY MOUTH EVERY DAY  ? glucose blood test strip Use as instructed to test blood sugar 3 times a day. Dx code: e11.65  ? Insulin Glargine ([name] KWIKPEN) 100 UNIT/ML Inject 0.22 mLs (22 Units total) into the skin at bedtime.  ? metFORMIN (GLUCOPHAGE) 1000 MG tablet TAKE 1 TABLET BY MOUTH TWICE A DAY WITH MEALS  ? sildenafil (VIAGRA) 100 MG tablet TAKE 1 TABLET BY MOUTH EVERY DAY AS NEEDED  ? SYRINGE-NEEDLE, DISP, 3 ML (B-D 3CC LUER-LOK SYR 25GX5/8") 25G X 5/8" 3 ML MISC USE AS DIRECTED  ? amLODipine (NORVASC) 10 MG tablet Take 1 tablet (10 mg total) by mouth daily.  ? ?Facility-Administered Encounter Medications as of 10/30/2021  ?Medication  ? cyanocobalamin ((VITAMIN B-12)) injection 1,000 mcg  ? ? ?Allergies (verified) ?Patient has no known allergies.  ? ?History: ?Past Medical  History:  ?Diagnosis Date  ? Diabetes mellitus   ? High cholesterol   ? Hypertension   ? ?Past Surgical History:  ?Procedure Laterality Date  ? KNEE SURGERY    ? torn cartilage  ? PATELLAR TENDON REPAIR  06/11/2011  ? Procedure: PATELLA TENDON REPAIR;  Surgeon: Meredith Pel;  Location: [facility];  Service: Orthopedics;  Laterality: Right;  ? ?Family History  ?Problem Relation Age of Onset  ? Hypertension Mother   ? Multiple myeloma Mother   ? Hypertension Father    ? Diabetes Father   ? ?Social History  ? ?Socioeconomic History  ? Marital status: Married  ?  Spouse name: Not on file  ? Number of children: Not on file  ? Years of education: Not on file  ? Highest education level: Not on file  ?Occupational History  ? Not on file  ?Tobacco Use  ? Smoking status: Never  ? Smokeless tobacco: Never  ? Tobacco comments:  ?  n/a  ?Vaping Use  ? Vaping Use: Never used  ?Substance and Sexual Activity  ? Alcohol use: No  ? Drug use: No  ? Sexual activity: Not on file  ?Other Topics Concern  ? Not on file  ?Social History Narrative  ? Not on file  ? ?Social Determinants of Health  ? ?Financial Resource Strain: Low Risk   ? Difficulty of Paying Living Expenses: Not hard at all  ?Food Insecurity: No Food Insecurity  ? Worried About Charity fundraiser in the Last Year: Never true  ? Ran Out of Food in the Last Year: Never true  ?Transportation Needs: No Transportation Needs  ? Lack of Transportation (Medical): No  ? Lack of Transportation (Non-Medical): No  ?Physical Activity: Insufficiently Active  ? Days of Exercise per Week: 3 days  ? Minutes of Exercise per Session: 30 min  ?Stress: No Stress Concern Present  ? Feeling of Stress : Not at all  ?Social Connections: Not on file  ? ? ?Tobacco Counseling ?Counseling given: Not Answered ?Tobacco comments: n/a ? ? ?Clinical Intake: ? ?Pre-visit preparation completed: Yes ? ?Pain : No/denies pain ? ?  ? ?Nutritional Status: BMI > 30  Obese ?Nutritional Risks: None ?Diabetes: Yes ? ?How often do you need to have someone help you when you read instructions, pamphlets, or other written materials from your doctor or pharmacy?: 1 - Never ?What is the last grade level you completed in school?: some college ? ?Diabetic? Yes ?Nutrition Risk Assessment: ? ?Has the patient had any N/V/D within the last 2 months?  No  ?Does the patient have any non-healing wounds?  No  ?Has the patient had any unintentional weight loss or weight gain?  No   ? ?Diabetes: ? ?Is the patient diabetic?  Yes  ?If diabetic, was a CBG obtained today?  No  ?Did the patient bring in their glucometer from home?  No  ?How often do you monitor your CBG's? daily.  ? ?Financial Strains and Diabetes Management: ? ?Are you having any financial strains with the device, your supplies or your medication? No .  ?Does the patient want to be seen by Chronic Care Management for management of their diabetes?  No  ?Would the patient like to be referred to a Nutritionist or for Diabetic Management?  No  ? ?Diabetic Exams: ? ?Diabetic Eye Exam: Completed 10/11/2020 ?Diabetic Foot Exam: Completed 01/21/2021 ? ? ?Interpreter Needed?: No ? ?Information entered by :: NAllen LPN ? ? ?Activities of  Daily Living ? ?  10/30/2021  ? 12:22 PM 01/21/2021  ?  8:43 AM  ?In your present state of health, do you have any difficulty performing the following activities:  ?Hearing? 0 0  ?Vision? 0 0  ?Difficulty concentrating or making decisions? 0 0  ?Walking or climbing stairs? 0 0  ?Dressing or bathing? 0 0  ?Doing errands, shopping? 0 0  ?Preparing Food and eating ? N   ?Using the Toilet? N   ?In the past six months, have you accidently leaked urine? N   ?Do you have problems with loss of bowel control? N   ?Managing your Medications? N   ?Managing your Finances? N   ?Housekeeping or managing your Housekeeping? N   ? ? ?Patient Care Team: ?[name], MD as PCP - General (Internal Medicine) ?Little, Claudette Stapler, RN as Case Manager ? ?Indicate any recent Medical Services you may have received from other than [facility] providers in the past year (date may be approximate). ? ?   ?Assessment:  ? This is a routine wellness examination for [place]. ? ?Hearing/Vision screen ?Vision Screening - Comments:: Regular eye exams, [facility] ? ?Dietary issues and exercise activities discussed: ?Current Exercise Habits: Home exercise routine, Type of exercise: walking, Time (Minutes): 30, Frequency (Times/Week): 3, Weekly Exercise  (Minutes/Week): 90 ? ? Goals Addressed   ? ?  ?  ?  ?  ? This Visit's Progress  ?  Patient Stated     ?  10/30/2021, wants to get off diabetic medication ?  ? ?  ? ?Depression Screen ? ?  10/30/2021  ? 12:22 PM 01/21/2021  ?  8

## 2021-11-05 ENCOUNTER — Ambulatory Visit (INDEPENDENT_AMBULATORY_CARE_PROVIDER_SITE_OTHER): Payer: Medicare Other

## 2021-11-05 VITALS — BP 110/64 | HR 64 | Temp 98.3°F

## 2021-11-05 DIAGNOSIS — Z23 Encounter for immunization: Secondary | ICD-10-CM

## 2021-11-05 NOTE — Progress Notes (Signed)
Patient presents today for shingles vaccine. He will return to the office in 2-3 months for 2nd dose.  ?

## 2021-11-05 NOTE — Patient Instructions (Signed)
Recombinant Zoster (Shingles) Vaccine: What You Need to Know 1. Why get vaccinated? Recombinant zoster (shingles) vaccine can prevent shingles. Shingles (also called herpes zoster, or just zoster) is a painful skin rash, usually with blisters. In addition to the rash, shingles can cause fever, headache, chills, or upset stomach. Rarely, shingles can lead to complications such as pneumonia, hearing problems, blindness, brain inflammation (encephalitis), or death. The risk of shingles increases with age. The most common complication of shingles is long-term nerve pain called postherpetic neuralgia (PHN). PHN occurs in the areas where the shingles rash was and can last for months or years after the rash goes away. The pain from PHN can be severe and debilitating. The risk of PHN increases with age. An older adult with shingles is more likely to develop PHN and have longer lasting and more severe pain than a younger person. People with weakened immune systems also have a higher risk of getting shingles and complications from the disease. Shingles is caused by varicella-zoster virus, the same virus that causes chickenpox. After you have chickenpox, the virus stays in your body and can cause shingles later in life. Shingles cannot be passed from one person to another, but the virus that causes shingles can spread and cause chickenpox in someone who has never had chickenpox or has never received chickenpox vaccine. 2. Recombinant shingles vaccine Recombinant shingles vaccine provides strong protection against shingles. By preventing shingles, recombinant shingles vaccine also protects against PHN and other complications. Recombinant shingles vaccine is recommended for: Adults 50 years and older Adults 19 years and older who have a weakened immune system because of disease or treatments Shingles vaccine is given as a two-dose series. For most people, the second dose should be given 2 to 6 months after the first  dose. Some people who have or will have a weakened immune system can get the second dose 1 to 2 months after the first dose. Ask your health care provider for guidance. People who have had shingles in the past and people who have received varicella (chickenpox) vaccine are recommended to get recombinant shingles vaccine. The vaccine is also recommended for people who have already gotten another type of shingles vaccine, the live shingles vaccine. There is no live virus in recombinant shingles vaccine. Shingles vaccine may be given at the same time as other vaccines. 3. Talk with your health care provider Tell your vaccination provider if the person getting the vaccine: Has had an allergic reaction after a previous dose of recombinant shingles vaccine, or has any severe, life-threatening allergies Is currently experiencing an episode of shingles Is pregnant In some cases, your health care provider may decide to postpone shingles vaccination until a future visit. People with minor illnesses, such as a cold, may be vaccinated. People who are moderately or severely ill should usually wait until they recover before getting recombinant shingles vaccine. Your health care provider can give you more information. 4. Risks of a vaccine reaction A sore arm with mild or moderate pain is very common after recombinant shingles vaccine. Redness and swelling can also happen at the site of the injection. Tiredness, muscle pain, headache, shivering, fever, stomach pain, and nausea are common after recombinant shingles vaccine. These side effects may temporarily prevent a vaccinated person from doing regular activities. Symptoms usually go away on their own in 2 to 3 days. You should still get the second dose of recombinant shingles vaccine even if you had one of these reactions after the first dose. Guillain-Barr   syndrome (GBS), a serious nervous system disorder, has been reported very rarely after recombinant zoster  vaccine. People sometimes faint after medical procedures, including vaccination. Tell your provider if you feel dizzy or have vision changes or ringing in the ears. As with any medicine, there is a very remote chance of a vaccine causing a severe allergic reaction, other serious injury, or death. 5. What if there is a serious problem? An allergic reaction could occur after the vaccinated person leaves the clinic. If you see signs of a severe allergic reaction (hives, swelling of the face and throat, difficulty breathing, a fast heartbeat, dizziness, or weakness), call 9-1-1 and get the person to the nearest hospital. For other signs that concern you, call your health care provider. Adverse reactions should be reported to the Vaccine Adverse Event Reporting System (VAERS). Your health care provider will usually file this report, or you can do it yourself. Visit the VAERS website at www.vaers.hhs.gov or call 1-800-822-7967. VAERS is only for reporting reactions, and VAERS staff members do not give medical advice. 6. How can I learn more? Ask your health care provider. Call your local or state health department. Visit the website of the Food and Drug Administration (FDA) for vaccine package inserts and additional information at www.fda.gov/vaccinesblood-biologics/vaccines. Contact the Centers for Disease Control and Prevention (CDC): Call 1-800-232-4636 (1-800-CDC-INFO) or Visit CDC's website at www.cdc.gov/vaccines. Source: CDC Vaccine Information Statement Recombinant Zoster Vaccine (07/27/2020) This same material is available at www.cdc.gov for no charge. This information is not intended to replace advice given to you by your health care provider. Make sure you discuss any questions you have with your health care provider. Document Revised: 05/08/2021 Document Reviewed: 08/10/2020 Elsevier Patient Education  2023 Elsevier Inc.  

## 2021-11-11 ENCOUNTER — Other Ambulatory Visit: Payer: Self-pay | Admitting: Internal Medicine

## 2021-11-11 ENCOUNTER — Telehealth: Payer: Medicare Other

## 2021-11-11 ENCOUNTER — Ambulatory Visit: Payer: Self-pay

## 2021-11-11 DIAGNOSIS — N182 Chronic kidney disease, stage 2 (mild): Secondary | ICD-10-CM

## 2021-11-11 DIAGNOSIS — I10 Essential (primary) hypertension: Secondary | ICD-10-CM

## 2021-11-11 DIAGNOSIS — E1122 Type 2 diabetes mellitus with diabetic chronic kidney disease: Secondary | ICD-10-CM

## 2021-11-11 NOTE — Chronic Care Management (AMB) (Signed)
  Care Management   Follow Up Note   11/11/2021 Name: William Mcguire MRN: [id number] DOB: 1955/11/05   Referred by: [name], MD Reason for referral : Chronic Care Management (RN CM Follow up call #3)   Third unsuccessful telephone outreach was attempted today. The patient was referred to the case management team for assistance with care management and care coordination. The patient's primary care provider has been notified of our unsuccessful attempts to make or maintain contact with the patient. The care management team is pleased to engage with this patient at any time in the future should he/she be interested in assistance from the care management team.   Follow Up Plan: A HIPPA compliant phone message was left for the patient providing contact information and requesting a return call.   Barb Merino, RN, BSN, CCM Care Management Coordinator [place] [facility]  Direct Phone: [phone]

## 2021-11-14 ENCOUNTER — Encounter: Payer: Self-pay | Admitting: Internal Medicine

## 2021-11-15 ENCOUNTER — Other Ambulatory Visit: Payer: Self-pay | Admitting: Internal Medicine

## 2021-11-26 ENCOUNTER — Other Ambulatory Visit: Payer: Self-pay

## 2021-11-26 ENCOUNTER — Encounter: Payer: Self-pay | Admitting: Internal Medicine

## 2021-11-26 DIAGNOSIS — E1122 Type 2 diabetes mellitus with diabetic chronic kidney disease: Secondary | ICD-10-CM

## 2021-11-26 MED ORDER — ONETOUCH VERIO VI STRP: ORAL_STRIP | 2 refills | Status: DC

## 2021-11-26 MED ORDER — GLUCOSE BLOOD VI STRP: ORAL_STRIP | 2 refills | Status: AC

## 2022-01-14 ENCOUNTER — Other Ambulatory Visit: Payer: Self-pay | Admitting: Internal Medicine

## 2022-01-20 ENCOUNTER — Other Ambulatory Visit: Payer: Self-pay | Admitting: Internal Medicine

## 2022-01-27 ENCOUNTER — Encounter: Payer: Medicare Other | Admitting: Internal Medicine

## 2022-01-27 NOTE — Progress Notes (Deleted)
William Mcguire,acting as a Education administrator for William Greenland, MD.,have documented all relevant documentation on the behalf of William Greenland, MD,as directed by  William Greenland, MD while in the presence of William Greenland, MD.   Subjective:     Patient ID: William Mcguire , male    DOB: [birth year] , 66 y.o.   MRN: [id number]   Chief Complaint  Patient presents with   Annual Exam    HPI  He is here today for physical exam. He is followed by Urology for his prostate exams. He reports compliance with meds.   Diabetes He presents for his follow-up diabetic visit. He has type 2 diabetes mellitus. His disease course has been stable. There are no hypoglycemic associated symptoms. Pertinent negatives for diabetes include no blurred vision. There are no hypoglycemic complications. Diabetic complications include nephropathy. Risk factors for coronary artery disease include diabetes mellitus, dyslipidemia, hypertension, male sex and sedentary lifestyle. His breakfast blood glucose is taken between 8-9 am. His breakfast blood glucose range is generally 90-110 mg/dl.  Hypertension This is a chronic problem. The current episode started more than 1 year ago. The problem has been gradually improving since onset. The problem is controlled. Pertinent negatives include no blurred vision.     Past Medical History:  Diagnosis Date   Diabetes mellitus    High cholesterol    Hypertension      Family History  Problem Relation Age of Onset   Hypertension Mother    Multiple myeloma Mother    Hypertension Father    Diabetes Father      Current Outpatient Medications:    amLODipine (NORVASC) 10 MG tablet, TAKE 1 TABLET BY MOUTH EVERY DAY, Disp: 90 tablet, Rfl: 1   aspirin EC 81 MG tablet, Take 81 mg by mouth daily., Disp: , Rfl:    atorvastatin (LIPITOR) 40 MG tablet, TAKE 1 TABLET BY MOUTH EVERY DAY, Disp: 90 tablet, Rfl: 1   B-D ULTRAFINE III SHORT PEN 31G X 8 MM MISC, USE AS DIRECTED, Disp: 100  each, Rfl: PRN   cyanocobalamin (,VITAMIN B-12,) 1000 MCG/ML injection, Inject 1 mL (1,000 mcg total) into the muscle every 30 (thirty) days., Disp: 9 mL, Rfl: 1   Dulaglutide 1.5 MG/0.5ML SOPN, Inject 1.5 mg into the skin once a week. , Disp: , Rfl:    enalapril (VASOTEC) 10 MG tablet, TAKE 1 TABLET BY MOUTH EVERY DAY, Disp: 90 tablet, Rfl: 2   glucose blood (ONETOUCH VERIO) test strip, Use as instructed to check blood sugars twice daily E11.69, Disp: 100 each, Rfl: 2   glucose blood test strip, Use as instructed to test blood sugar 3 times a day. Dx code: e11.65, Disp: 100 each, Rfl: 2   Insulin Glargine ([name] KWIKPEN) 100 UNIT/ML, Inject 0.22 mLs (22 Units total) into the skin at bedtime., Disp: 45 mL, Rfl: 3   metFORMIN (GLUCOPHAGE) 1000 MG tablet, TAKE 1 TABLET BY MOUTH TWICE A DAY WITH MEALS, Disp: 180 tablet, Rfl: 0   sildenafil (VIAGRA) 100 MG tablet, TAKE 1 TABLET BY MOUTH EVERY DAY AS NEEDED, Disp: 10 tablet, Rfl: 5   SYRINGE-NEEDLE, DISP, 3 ML (B-D 3CC LUER-LOK SYR 25GX5/8") 25G X 5/8" 3 ML MISC, USE AS DIRECTED, Disp: 12 each, Rfl: 24  Current Facility-Administered Medications:    cyanocobalamin ((VITAMIN B-12)) injection 1,000 mcg, 1,000 mcg, Intramuscular, Once, [name], MD   No Known Allergies   Men's preventive visit. Patient Health Questionnaire (PHQ-2) is  Flowsheet Row Clinical  Support from 10/30/2021 in [facility]  PHQ-2 Total Score 0     . Patient is on a *** diet. Marital status: Married. Relevant history for alcohol use is:  Social History   Substance and Sexual Activity  Alcohol Use No  . Relevant history for tobacco use is:  Social History   Tobacco Use  Smoking Status Never  Smokeless Tobacco Never  Tobacco Comments   n/a  .   Review of Systems  Constitutional: Negative.   HENT: Negative.    Eyes: Negative.  Negative for blurred vision.  Respiratory: Negative.    Cardiovascular: Negative.   Gastrointestinal:  Negative.   Endocrine: Negative.   Genitourinary: Negative.   Musculoskeletal: Negative.   Skin: Negative.   Neurological: Negative.   Hematological: Negative.   Psychiatric/Behavioral: Negative.       There were no vitals filed for this visit. There is no height or weight on file to calculate BMI.   Objective:  Physical Exam      Assessment And Plan:    1. Encounter for general adult medical examination w/o abnormal findings  2. Type 2 diabetes mellitus with stage 2 chronic kidney disease, with long-term current use of insulin ([place])  3. Hypertensive nephropathy  4. Mixed hyperlipidemia     Patient was given opportunity to ask questions. Patient verbalized understanding of the plan and was able to repeat key elements of the plan. All questions were answered to their satisfaction.   Sheppard Evens Mcguire, CMA   I, [place], CMA, have reviewed all documentation for this visit. The documentation on 01/27/22 for the exam, diagnosis, procedures, and orders are all accurate and complete.  THE PATIENT IS ENCOURAGED TO PRACTICE SOCIAL DISTANCING DUE TO THE COVID-19 PANDEMIC.

## 2022-01-30 DIAGNOSIS — U071 COVID-19: Secondary | ICD-10-CM | POA: Diagnosis not present

## 2022-02-02 NOTE — Progress Notes (Signed)
No show

## 2022-03-06 ENCOUNTER — Ambulatory Visit: Payer: Medicare Other | Admitting: Internal Medicine

## 2022-03-12 ENCOUNTER — Other Ambulatory Visit: Payer: Self-pay | Admitting: Internal Medicine

## 2022-03-31 ENCOUNTER — Encounter: Payer: Self-pay | Admitting: Internal Medicine

## 2022-03-31 ENCOUNTER — Ambulatory Visit (INDEPENDENT_AMBULATORY_CARE_PROVIDER_SITE_OTHER): Payer: Medicare Other | Admitting: Internal Medicine

## 2022-03-31 VITALS — BP 124/76 | HR 73 | Temp 98.0°F | Ht 75.0 in | Wt 254.6 lb

## 2022-03-31 DIAGNOSIS — Z23 Encounter for immunization: Secondary | ICD-10-CM

## 2022-03-31 DIAGNOSIS — N182 Chronic kidney disease, stage 2 (mild): Secondary | ICD-10-CM | POA: Diagnosis not present

## 2022-03-31 DIAGNOSIS — Z6831 Body mass index (BMI) 31.0-31.9, adult: Secondary | ICD-10-CM

## 2022-03-31 DIAGNOSIS — I129 Hypertensive chronic kidney disease with stage 1 through stage 4 chronic kidney disease, or unspecified chronic kidney disease: Secondary | ICD-10-CM | POA: Diagnosis not present

## 2022-03-31 DIAGNOSIS — Z794 Long term (current) use of insulin: Secondary | ICD-10-CM | POA: Diagnosis not present

## 2022-03-31 DIAGNOSIS — R5383 Other fatigue: Secondary | ICD-10-CM

## 2022-03-31 DIAGNOSIS — Z862 Personal history of diseases of the blood and blood-forming organs and certain disorders involving the immune mechanism: Secondary | ICD-10-CM

## 2022-03-31 DIAGNOSIS — E6609 Other obesity due to excess calories: Secondary | ICD-10-CM

## 2022-03-31 DIAGNOSIS — E1122 Type 2 diabetes mellitus with diabetic chronic kidney disease: Secondary | ICD-10-CM | POA: Diagnosis not present

## 2022-03-31 DIAGNOSIS — N1831 Chronic kidney disease, stage 3a: Secondary | ICD-10-CM | POA: Insufficient documentation

## 2022-03-31 MED ORDER — ATORVASTATIN CALCIUM 40 MG PO TABS: 40.0000 mg | ORAL_TABLET | Freq: Every day | ORAL | 1 refills | Status: DC

## 2022-03-31 MED ORDER — DULAGLUTIDE 1.5 MG/0.5ML ~~LOC~~ SOAJ: 1.5000 mg | SUBCUTANEOUS | 2 refills | Status: DC

## 2022-03-31 MED ORDER — AMLODIPINE BESYLATE 10 MG PO TABS
10.0000 mg | ORAL_TABLET | Freq: Every day | ORAL | 1 refills | Status: DC
Start: 2022-03-31 — End: 2022-12-15

## 2022-03-31 NOTE — Progress Notes (Signed)
William Mcguire,acting as a Education administrator for William Greenland, MD.,have documented all relevant documentation on the behalf of William Greenland, MD,as directed by  William Greenland, MD while in the presence of William Greenland, MD.    Subjective:     Patient ID: William Mcguire , male    DOB: September 25, 1955 , 66 y.o.   MRN: [id number]   Chief Complaint  Patient presents with   Diabetes   Hypertension    HPI  He is here today for diabetes/HTN f/u.  He reports compliance with meds. He was last seen in April 2023. Unfortunately, he had to cancel his CPE in August 2023 because he was out of town. He denies headaches, chest pain and shortness of breath. However, he does report that he is more fatigued when cutting the grass.  In the past, he has been able to cut front and back yard without breaks. Now, he states he needs to take a break in between due to fatigue.   Diabetes He presents for his follow-up diabetic visit. He has type 2 diabetes mellitus. His disease course has been stable. There are no hypoglycemic associated symptoms. Associated symptoms include fatigue. Pertinent negatives for diabetes include no blurred vision, no chest pain, no polydipsia, no polyphagia and no polyuria. There are no hypoglycemic complications. Diabetic complications include nephropathy. Risk factors for coronary artery disease include diabetes mellitus, dyslipidemia, hypertension, male sex and sedentary lifestyle. His breakfast blood glucose is taken between 8-9 am. His breakfast blood glucose range is generally 90-110 mg/dl.  Hypertension This is a chronic problem. The current episode started more than 1 year ago. The problem has been gradually improving since onset. The problem is controlled. Pertinent negatives include no blurred vision, chest pain, palpitations or shortness of breath.     Past Medical History:  Diagnosis Date   Diabetes mellitus    High cholesterol    Hypertension      Family History  Problem  Relation Age of Onset   Hypertension Mother    Multiple myeloma Mother    Hypertension Father    Diabetes Father      Current Outpatient Medications:    aspirin EC 81 MG tablet, Take 81 mg by mouth daily., Disp: , Rfl:    B-D ULTRAFINE III SHORT PEN 31G X 8 MM MISC, USE AS DIRECTED, Disp: 100 each, Rfl: PRN   cyanocobalamin (,VITAMIN B-12,) 1000 MCG/ML injection, Inject 1 mL (1,000 mcg total) into the muscle every 30 (thirty) days., Disp: 9 mL, Rfl: 1   enalapril (VASOTEC) 10 MG tablet, TAKE 1 TABLET BY MOUTH EVERY DAY, Disp: 90 tablet, Rfl: 2   glucose blood (ONETOUCH VERIO) test strip, Use as instructed to check blood sugars twice daily E11.69, Disp: 100 each, Rfl: 2   glucose blood test strip, Use as instructed to test blood sugar 3 times a day. Dx code: e11.65, Disp: 100 each, Rfl: 2   Insulin Glargine ([name] KWIKPEN) 100 UNIT/ML, Inject 0.22 mLs (22 Units total) into the skin at bedtime., Disp: 45 mL, Rfl: 3   metFORMIN (GLUCOPHAGE) 1000 MG tablet, TAKE 1 TABLET BY MOUTH TWICE A DAY WITH MEALS, Disp: 180 tablet, Rfl: 0   sildenafil (VIAGRA) 100 MG tablet, TAKE 1 TABLET BY MOUTH EVERY DAY AS NEEDED, Disp: 10 tablet, Rfl: 5   SYRINGE-NEEDLE, DISP, 3 ML (B-D INTEGRA SYRINGE) 25G X 5/8" 3 ML MISC, USE AS DIRECTED, Disp: 12 each, Rfl: 24   amLODipine (NORVASC) 10 MG tablet, Take  1 tablet (10 mg total) by mouth daily., Disp: 90 tablet, Rfl: 1   atorvastatin (LIPITOR) 40 MG tablet, Take 1 tablet (40 mg total) by mouth daily., Disp: 90 tablet, Rfl: 1   Dulaglutide 1.5 MG/0.5ML SOPN, Inject 1.5 mg into the skin once a week., Disp: 6 mL, Rfl: 2  Current Facility-Administered Medications:    cyanocobalamin ((VITAMIN B-12)) injection 1,000 mcg, 1,000 mcg, Intramuscular, Once, William Chard, MD   No Known Allergies   Review of Systems  Constitutional:  Positive for fatigue.  Eyes: Negative.  Negative for blurred vision.  Respiratory: Negative.  Negative for shortness of breath.    Cardiovascular: Negative.  Negative for chest pain and palpitations.  Gastrointestinal: Negative.   Endocrine: Negative for polydipsia, polyphagia and polyuria.  Musculoskeletal: Negative.   Skin: Negative.   Neurological: Negative.   Psychiatric/Behavioral: Negative.       Today's Vitals   03/31/22 1540 03/31/22 1557  BP: (!) 160/58 124/76  Pulse: 73   Temp: 98 F (36.7 C)   Weight: 254 lb 9.6 oz (115.5 kg)   Height: 6' 3" (1.905 m)   PainSc: 0-No pain    Body mass index is 31.82 kg/m.  Wt Readings from Last 3 Encounters:  03/31/22 254 lb 9.6 oz (115.5 kg)  10/30/21 245 lb (111.1 kg)  10/07/21 245 lb 12.8 oz (111.5 kg)    Objective:  Physical Exam Vitals and nursing note reviewed.  Constitutional:      Appearance: Normal appearance.  HENT:     Head: Normocephalic and atraumatic.  Eyes:     Extraocular Movements: Extraocular movements intact.  Cardiovascular:     Rate and Rhythm: Normal rate and regular rhythm.     Heart sounds: Normal heart sounds.  Pulmonary:     Effort: Pulmonary effort is normal.     Breath sounds: Normal breath sounds.  Musculoskeletal:     Cervical back: Normal range of motion.  Skin:    General: Skin is warm.  Neurological:     General: No focal deficit present.     Mental Status: He is alert.  Psychiatric:        Mood and Affect: Mood normal.      Assessment And Plan:     1. Type 2 diabetes mellitus with stage 2 chronic kidney disease, with long-term current use of insulin (HCC) Comments: Chronic, I will check labs as listed below. I will adjust meds as needed. He will rto in 4 months for CPE.  - Urine microalbumin-creatinine with uACR - CBC no Diff - Hemoglobin A1c - CMP14+EGFR  2. Hypertensive nephropathy Comments: Chronic, initially uncontrolled. Repeat BP was improved. He will c/w amlodipine and enalapril; however, adivsed to take one in AM and one in PM for improved control. I will check renal function today.  -  CMP14+EGFR  3. Other fatigue Comments: I will check serum testosterone levels and forward to his Urologist for review. However, pt advised we may need to also consider cardiac evaluation.  - Testosterone,Free and Total  4. Class 1 obesity due to excess calories with serious comorbidity and body mass index (BMI) of 31.0 to 31.9 in adult Comments: He is encouraged to aim for at least 150 minutes of exercise per week, while striving for BMI<29 to decrease cardiac risk.   5. History of anemia Comments: I will check CBC and iron panel today. He is up to date with CRC screening.  - Iron, TIBC and Ferritin Panel  6. Immunization due Comments:  He was given Shingrix IM x 1, this will complete his series. This will be billed via [employer].  - Varicella-zoster vaccine IM   Patient was given opportunity to ask questions. Patient verbalized understanding of the plan and was able to repeat key elements of the plan. All questions were answered to their satisfaction.   I, William Greenland, MD, have reviewed all documentation for this visit. The documentation on 03/31/22 for the exam, diagnosis, procedures, and orders are all accurate and complete.   IF YOU HAVE BEEN REFERRED TO A SPECIALIST, IT MAY TAKE 1-2 WEEKS TO SCHEDULE/PROCESS THE REFERRAL. IF YOU HAVE NOT HEARD FROM US/SPECIALIST IN TWO WEEKS, PLEASE GIVE [place] A CALL AT [phone].   THE PATIENT IS ENCOURAGED TO PRACTICE SOCIAL DISTANCING DUE TO THE COVID-19 PANDEMIC.

## 2022-03-31 NOTE — Patient Instructions (Signed)

## 2022-04-05 LAB — CMP14+EGFR
ALT: 15 IU/L (ref 0–44)
AST: 26 IU/L (ref 0–40)
Albumin/Globulin Ratio: 1.3 (ref 1.2–2.2)
Albumin: 4.4 g/dL (ref 3.9–4.9)
Alkaline Phosphatase: 105 IU/L (ref 44–121)
BUN/Creatinine Ratio: 8 — ABNORMAL LOW (ref 10–24)
BUN: 12 mg/dL (ref 8–27)
Bilirubin Total: 0.2 mg/dL (ref 0.0–1.2)
CO2: 23 mmol/L (ref 20–29)
Calcium: 9.7 mg/dL (ref 8.6–10.2)
Chloride: 104 mmol/L (ref 96–106)
Creatinine, Ser: 1.42 mg/dL — ABNORMAL HIGH (ref 0.76–1.27)
Globulin, Total: 3.5 g/dL (ref 1.5–4.5)
Glucose: 126 mg/dL — ABNORMAL HIGH (ref 70–99)
Potassium: 4.3 mmol/L (ref 3.5–5.2)
Sodium: 139 mmol/L (ref 134–144)
Total Protein: 7.9 g/dL (ref 6.0–8.5)
eGFR: 54 mL/min/{1.73_m2} — ABNORMAL LOW (ref >59)

## 2022-04-05 LAB — IRON,TIBC AND FERRITIN PANEL
Ferritin: 61 ng/mL (ref 30–400)
Iron Saturation: 25 % (ref 15–55)
Iron: 74 ug/dL (ref 38–169)
Total Iron Binding Capacity: 298 ug/dL (ref 250–450)
UIBC: 224 ug/dL (ref 111–343)

## 2022-04-05 LAB — CBC
Hematocrit: 36 % — ABNORMAL LOW (ref 37.5–51.0)
Hemoglobin: 12.1 g/dL — ABNORMAL LOW (ref 13.0–17.7)
MCH: 28.6 pg (ref 26.6–33.0)
MCHC: 33.6 g/dL (ref 31.5–35.7)
MCV: 85 fL (ref 79–97)
Platelets: 236 10*3/uL (ref 150–450)
RBC: 4.23 x10E6/uL (ref 4.14–5.80)
RDW: 13.2 % (ref 11.6–15.4)
WBC: 6 10*3/uL (ref 3.4–10.8)

## 2022-04-05 LAB — MICROALBUMIN / CREATININE URINE RATIO
Creatinine, Urine: 276 mg/dL
Microalb/Creat Ratio: 14 mg/g creat (ref 0–29)
Microalbumin, Urine: 38.1 ug/mL

## 2022-04-05 LAB — HEMOGLOBIN A1C
Est. average glucose Bld gHb Est-mCnc: 160 mg/dL
Hgb A1c MFr Bld: 7.2 % — ABNORMAL HIGH (ref 4.8–5.6)

## 2022-04-05 LAB — TESTOSTERONE,FREE AND TOTAL
Testosterone, Free: 2.3 pg/mL — ABNORMAL LOW (ref 6.6–18.1)
Testosterone: 263 ng/dL — ABNORMAL LOW (ref 264–916)

## 2022-04-09 ENCOUNTER — Other Ambulatory Visit: Payer: Self-pay | Admitting: Internal Medicine

## 2022-04-09 DIAGNOSIS — D649 Anemia, unspecified: Secondary | ICD-10-CM

## 2022-04-10 ENCOUNTER — Telehealth: Payer: Self-pay

## 2022-04-11 ENCOUNTER — Encounter: Payer: Self-pay | Admitting: Internal Medicine

## 2022-04-17 ENCOUNTER — Other Ambulatory Visit: Payer: Medicare Other

## 2022-04-17 DIAGNOSIS — D649 Anemia, unspecified: Secondary | ICD-10-CM

## 2022-04-18 ENCOUNTER — Other Ambulatory Visit: Payer: Self-pay | Admitting: Internal Medicine

## 2022-04-21 LAB — PROTEIN ELECTROPHORESIS, SERUM
A/G Ratio: 1.1 (ref 0.7–1.7)
Albumin ELP: 3.8 g/dL (ref 2.9–4.4)
Alpha 1: 0.2 g/dL (ref 0.0–0.4)
Alpha 2: 0.7 g/dL (ref 0.4–1.0)
Beta: 0.9 g/dL (ref 0.7–1.3)
Gamma Globulin: 1.7 g/dL (ref 0.4–1.8)
Globulin, Total: 3.5 g/dL (ref 2.2–3.9)
M-Spike, %: 1.4 g/dL — ABNORMAL HIGH
Total Protein: 7.3 g/dL (ref 6.0–8.5)

## 2022-04-22 ENCOUNTER — Other Ambulatory Visit: Payer: Self-pay | Admitting: Internal Medicine

## 2022-04-22 DIAGNOSIS — R778 Other specified abnormalities of plasma proteins: Secondary | ICD-10-CM

## 2022-04-24 ENCOUNTER — Encounter: Payer: Self-pay | Admitting: Internal Medicine

## 2022-04-25 ENCOUNTER — Telehealth: Payer: Self-pay | Admitting: Hematology and Oncology

## 2022-04-25 NOTE — Telephone Encounter (Signed)
Scheduled appointment per 10/31. Patient is aware of appointment date and time. Patient is aware to arrive 15 mins prior to appointment time and to bring updated insurance cards. Patient is aware of location.   

## 2022-05-19 ENCOUNTER — Inpatient Hospital Stay: Payer: Medicare Other

## 2022-05-19 ENCOUNTER — Inpatient Hospital Stay: Payer: Medicare Other | Attending: Hematology and Oncology | Admitting: Hematology and Oncology

## 2022-05-19 ENCOUNTER — Encounter: Payer: Self-pay | Admitting: Hematology and Oncology

## 2022-05-19 ENCOUNTER — Other Ambulatory Visit: Payer: Self-pay

## 2022-05-19 ENCOUNTER — Telehealth: Payer: Self-pay

## 2022-05-19 VITALS — BP 165/80 | HR 65 | Temp 97.9°F | Resp 18 | Ht 75.0 in | Wt 256.8 lb

## 2022-05-19 DIAGNOSIS — D472 Monoclonal gammopathy: Secondary | ICD-10-CM | POA: Insufficient documentation

## 2022-05-19 DIAGNOSIS — N182 Chronic kidney disease, stage 2 (mild): Secondary | ICD-10-CM

## 2022-05-19 DIAGNOSIS — Z807 Family history of other malignant neoplasms of lymphoid, hematopoietic and related tissues: Secondary | ICD-10-CM | POA: Diagnosis not present

## 2022-05-19 DIAGNOSIS — E559 Vitamin D deficiency, unspecified: Secondary | ICD-10-CM | POA: Insufficient documentation

## 2022-05-19 DIAGNOSIS — E1122 Type 2 diabetes mellitus with diabetic chronic kidney disease: Secondary | ICD-10-CM | POA: Insufficient documentation

## 2022-05-19 DIAGNOSIS — I129 Hypertensive chronic kidney disease with stage 1 through stage 4 chronic kidney disease, or unspecified chronic kidney disease: Secondary | ICD-10-CM | POA: Insufficient documentation

## 2022-05-19 DIAGNOSIS — D539 Nutritional anemia, unspecified: Secondary | ICD-10-CM | POA: Diagnosis not present

## 2022-05-19 LAB — CMP (CANCER CENTER ONLY)
ALT: 23 U/L (ref 0–44)
AST: 23 U/L (ref 15–41)
Albumin: 4.4 g/dL (ref 3.5–5.0)
Alkaline Phosphatase: 91 U/L (ref 38–126)
Anion gap: 4 — ABNORMAL LOW (ref 5–15)
BUN: 15 mg/dL (ref 8–23)
CO2: 28 mmol/L (ref 22–32)
Calcium: 10 mg/dL (ref 8.9–10.3)
Chloride: 104 mmol/L (ref 98–111)
Creatinine: 1.29 mg/dL — ABNORMAL HIGH (ref 0.61–1.24)
GFR, Estimated: >60 mL/min (ref >60)
Glucose, Bld: 132 mg/dL — ABNORMAL HIGH (ref 70–99)
Potassium: 4 mmol/L (ref 3.5–5.1)
Sodium: 136 mmol/L (ref 135–145)
Total Bilirubin: 0.5 mg/dL (ref 0.3–1.2)
Total Protein: 8 g/dL (ref 6.5–8.1)

## 2022-05-19 LAB — CBC WITH DIFFERENTIAL (CANCER CENTER ONLY)
Abs Immature Granulocytes: 0.02 10*3/uL (ref 0.00–0.07)
Basophils Absolute: 0 10*3/uL (ref 0.0–0.1)
Basophils Relative: 0 %
Eosinophils Absolute: 0.1 10*3/uL (ref 0.0–0.5)
Eosinophils Relative: 1 %
HCT: 35.2 % — ABNORMAL LOW (ref 39.0–52.0)
Hemoglobin: 11.6 g/dL — ABNORMAL LOW (ref 13.0–17.0)
Immature Granulocytes: 0 %
Lymphocytes Relative: 27 %
Lymphs Abs: 1.3 10*3/uL (ref 0.7–4.0)
MCH: 28.6 pg (ref 26.0–34.0)
MCHC: 33 g/dL (ref 30.0–36.0)
MCV: 86.9 fL (ref 80.0–100.0)
Monocytes Absolute: 0.4 10*3/uL (ref 0.1–1.0)
Monocytes Relative: 9 %
Neutro Abs: 3 10*3/uL (ref 1.7–7.7)
Neutrophils Relative %: 63 %
Platelet Count: 187 10*3/uL (ref 150–400)
RBC: 4.05 MIL/uL — ABNORMAL LOW (ref 4.22–5.81)
RDW: 13.3 % (ref 11.5–15.5)
WBC Count: 4.9 10*3/uL (ref 4.0–10.5)
nRBC: 0 % (ref 0.0–0.2)

## 2022-05-19 LAB — LACTATE DEHYDROGENASE: LDH: 128 U/L (ref 98–192)

## 2022-05-19 LAB — VITAMIN D 25 HYDROXY (VIT D DEFICIENCY, FRACTURES): Vit D, 25-Hydroxy: 22.21 ng/mL — ABNORMAL LOW (ref 30–100)

## 2022-05-19 NOTE — Telephone Encounter (Signed)
Called and given appt for bone survey on 12/6 arrive at 0930 for 10 am appt at [facility]. He verbalized understanding.

## 2022-05-19 NOTE — Assessment & Plan Note (Signed)
He has strong family history of multiple myeloma in his mother He has anemia and recent acute renal failure He met 2 criteria for possible diagnosis of multiple myeloma I will repeat blood work, add additional work-up including 24-hour urine collection, skeletal survey and others We did discuss the risk and benefits of bone marrow aspirate and biopsy The patient requested that I call him next week once his blood work results are available If for some reason, his myeloma panel came back normal, we will cancel the bone marrow aspirate and biopsy Otherwise, I will see him in a few weeks for further follow-up

## 2022-05-19 NOTE — Progress Notes (Signed)
[name]  Patient Care Team: [name], MD as PCP - General (Internal Medicine)  ASSESSMENT & PLAN:  Monoclonal gammopathy of unknown significance (MGUS) He has strong family history of multiple myeloma in his mother He has anemia and recent acute renal failure He met 2 criteria for possible diagnosis of multiple myeloma I will repeat blood work, add additional work-up including 24-hour urine collection, skeletal survey and others We did discuss the risk and benefits of bone marrow aspirate and biopsy The patient requested that I call him next week once his blood work results are available If for some reason, his myeloma panel came back normal, we will cancel the bone marrow aspirate and biopsy Otherwise, I will see him in a few weeks for further follow-up  Chronic renal disease, stage II This is multifactorial He does have history of diabetes and hypertension I recommend 24-hour urine collection and he is in agreement  Deficiency anemia This is multifactorial, could be related to anemia of chronic illness, chronic kidney disease or possible multiple myeloma We will repeat blood work as above  To rule out multiple myeloma, I recommend complete blood work, 24 hour urine collection for UPEP and skeletal survey to rule out multiple myeloma Depending on test results, we may or may not proceed with bone marrow aspirate and biopsy. Orders Placed This Encounter  Procedures   DG Bone Survey Met    Standing Status:   Future    Standing Expiration Date:   05/20/2023    Order Specific Question:   Reason for Exam (SYMPTOM  OR DIAGNOSIS REQUIRED)    Answer:   staging myeloma    Order Specific Question:   Preferred imaging location?    Answer:   [facility]   CT BONE MARROW BIOPSY & ASPIRATION    Standing Status:   Future    Standing Expiration Date:   05/20/2023    Order Specific Question:   Reason for Exam (SYMPTOM  OR DIAGNOSIS REQUIRED)     Answer:   myeloma staging    Order Specific Question:   Preferred imaging location?    Answer:   [facility]    Order Specific Question:   Radiology Contrast Protocol - do NOT remove file path    Answer:   [email or web address]   CBC with Differential (Cancer Center Only)    Standing Status:   Future    Number of Occurrences:   1    Standing Expiration Date:   05/20/2023   CMP ([facility] only)    Standing Status:   Future    Number of Occurrences:   1    Standing Expiration Date:   05/20/2023   VITAMIN D 25 Hydroxy (Vit-D Deficiency, Fractures)    Standing Status:   Future    Number of Occurrences:   1    Standing Expiration Date:   05/20/2023   Lactate dehydrogenase    Standing Status:   Future    Number of Occurrences:   1    Standing Expiration Date:   05/20/2023   Kappa/lambda light chains    Standing Status:   Future    Number of Occurrences:   1    Standing Expiration Date:   05/20/2023   Multiple Myeloma Panel (SPEP&IFE w/QIG)    Standing Status:   Future    Number of Occurrences:   1    Standing Expiration Date:   05/20/2023   UPEP/UIFE/Light Chains/TP, 24-Hr Ur  Standing Status:   Future    Standing Expiration Date:   05/20/2023   Beta 2 microglobulin, serum    Standing Status:   Future    Number of Occurrences:   1    Standing Expiration Date:   05/20/2023    All questions were answered. The patient knows to call the clinic with any problems, questions or concerns. I spent 60 minutes counseling the patient face to face. The total time spent in the appointment was 60 minutes and more than 50% was on counseling.     [name], MD 05/19/22 3:16 PM  CHIEF COMPLAINTS/PURPOSE OF CONSULTATION:  MGUS, anemia and renal failure He is here accompanied by his wife, William Mcguire  HISTORY OF PRESENTING ILLNESS:  William Mcguire 66 y.o. male is here because of recent abnormal blood work findings The patient have background history of  hypertension, hyperlipidemia and diabetes He was noted to have slight acute renal failure and anemia He has strong family history of multiple myeloma involving his mother  He denies history of abnormal bone pain or bone fracture. Patient denies history of recurrent infection or atypical infections such as shingles of meningitis. Denies chills, night sweats, anorexia or abnormal weight loss. The patient denies any recent signs or symptoms of bleeding such as spontaneous epistaxis, hematuria or hematochezia.  MEDICAL HISTORY:  Past Medical History:  Diagnosis Date   Diabetes mellitus    High cholesterol    Hypertension     SURGICAL HISTORY: Past Surgical History:  Procedure Laterality Date   KNEE SURGERY     torn cartilage   PATELLAR TENDON REPAIR  06/11/2011   Procedure: PATELLA TENDON REPAIR;  Surgeon: Meredith Pel;  Location: [facility];  Service: Orthopedics;  Laterality: Right;    SOCIAL HISTORY: Social History   Socioeconomic History   Marital status: Married    Spouse name: Not on file   Number of children: Not on file   Years of education: Not on file   Highest education level: Not on file  Occupational History   Not on file  Tobacco Use   Smoking status: Never   Smokeless tobacco: Never   Tobacco comments:    n/a  Vaping Use   Vaping Use: Never used  Substance and Sexual Activity   Alcohol use: No   Drug use: No   Sexual activity: Not on file  Other Topics Concern   Not on file  Social History Narrative   Not on file   Social Determinants of Health   Financial Resource Strain: Low Risk  (10/30/2021)   Overall Financial Resource Strain (CARDIA)    Difficulty of Paying Living Expenses: Not hard at all  Food Insecurity: No Food Insecurity (10/30/2021)   Hunger Vital Sign    Worried About Running Out of Food in the Last Year: Never true    [place] in the Last Year: Never true  Transportation Needs: No Transportation Needs (10/30/2021)   PRAPARE  - Hydrologist (Medical): No    Lack of Transportation (Non-Medical): No  Physical Activity: Insufficiently Active (10/30/2021)   Exercise Vital Sign    Days of Exercise per Week: 3 days    Minutes of Exercise per Session: 30 min  Stress: No Stress Concern Present (10/30/2021)   [place]    Feeling of Stress : Not at all  Social Connections: Not on file  Intimate Partner Violence: Not on  file    FAMILY HISTORY: Family History  Problem Relation Age of Onset   Hypertension Mother    Multiple myeloma Mother    Hypertension Father    Diabetes Father     ALLERGIES:  has No Known Allergies.  MEDICATIONS:  Current Outpatient Medications  Medication Sig Dispense Refill   amLODipine (NORVASC) 10 MG tablet Take 1 tablet (10 mg total) by mouth daily. 90 tablet 1   aspirin EC 81 MG tablet Take 81 mg by mouth daily.     atorvastatin (LIPITOR) 40 MG tablet Take 1 tablet (40 mg total) by mouth daily. 90 tablet 1   B-D ULTRAFINE III SHORT PEN 31G X 8 MM MISC USE AS DIRECTED 100 each PRN   cyanocobalamin (,VITAMIN B-12,) 1000 MCG/ML injection Inject 1 mL (1,000 mcg total) into the muscle every 30 (thirty) days. 9 mL 1   Dulaglutide 1.5 MG/0.5ML SOPN Inject 1.5 mg into the skin once a week. 6 mL 2   enalapril (VASOTEC) 10 MG tablet TAKE 1 TABLET BY MOUTH EVERY DAY 90 tablet 2   glucose blood (ONETOUCH VERIO) test strip Use as instructed to check blood sugars twice daily E11.69 100 each 2   glucose blood test strip Use as instructed to test blood sugar 3 times a day. Dx code: e11.65 100 each 2   Insulin Glargine ([name] KWIKPEN) 100 UNIT/ML Inject 0.22 mLs (22 Units total) into the skin at bedtime. 45 mL 3   metFORMIN (GLUCOPHAGE) 1000 MG tablet TAKE 1 TABLET BY MOUTH TWICE A DAY WITH FOOD 180 tablet 0   sildenafil (VIAGRA) 100 MG tablet TAKE 1 TABLET BY MOUTH EVERY DAY AS NEEDED 10 tablet 5    SYRINGE-NEEDLE, DISP, 3 ML (B-D INTEGRA SYRINGE) 25G X 5/8" 3 ML MISC USE AS DIRECTED 12 each 24   Current Facility-Administered Medications  Medication Dose Route Frequency Provider Last Rate Last Admin   cyanocobalamin ((VITAMIN B-12)) injection 1,000 mcg  1,000 mcg Intramuscular Once [name], MD        REVIEW OF SYSTEMS:   Eyes: Denies blurriness of vision, double vision or watery eyes Ears, nose, mouth, throat, and face: Denies mucositis or sore throat Respiratory: Denies cough, dyspnea or wheezes Cardiovascular: Denies palpitation, chest discomfort or lower extremity swelling Gastrointestinal:  Denies nausea, heartburn or change in bowel habits Skin: Denies abnormal skin rashes Lymphatics: Denies new lymphadenopathy or easy bruising Neurological:Denies numbness, tingling or new weaknesses Behavioral/Psych: Mood is stable, no new changes  All other systems were reviewed with the patient and are negative.  PHYSICAL EXAMINATION: ECOG PERFORMANCE STATUS: 0 - Asymptomatic  Vitals:   05/19/22 1114  BP: (!) 165/80  Pulse: 65  Resp: 18  Temp: 97.9 F (36.6 C)  SpO2: 100%   Filed Weights   05/19/22 1114  Weight: 256 lb 12.8 oz (116.5 kg)    GENERAL:alert, no distress and comfortable SKIN: skin color, texture, turgor are normal, no rashes or significant lesions EYES: normal, conjunctiva are pink and non-injected, sclera clear OROPHARYNX:no exudate, no erythema and lips, buccal mucosa, and tongue normal  NECK: supple, thyroid normal size, non-tender, without nodularity LYMPH:  no palpable lymphadenopathy in the cervical, axillary or inguinal LUNGS: clear to auscultation and percussion with normal breathing effort HEART: regular rate & rhythm and no murmurs and no lower extremity edema ABDOMEN:abdomen soft, non-tender and normal bowel sounds Musculoskeletal:no cyanosis of digits and no clubbing  PSYCH: alert & oriented x 3 with fluent speech NEURO: no focal  motor/sensory  deficits  LABORATORY DATA:  I have reviewed the data as listed Lab Results  Component Value Date   WBC 4.9 05/19/2022   HGB 11.6 (L) 05/19/2022   HCT 35.2 (L) 05/19/2022   MCV 86.9 05/19/2022   PLT 187 05/19/2022

## 2022-05-19 NOTE — Assessment & Plan Note (Signed)
This is multifactorial He does have history of diabetes and hypertension I recommend 24-hour urine collection and he is in agreement

## 2022-05-19 NOTE — Assessment & Plan Note (Signed)
This is multifactorial, could be related to anemia of chronic illness, chronic kidney disease or possible multiple myeloma We will repeat blood work as above

## 2022-05-20 LAB — KAPPA/LAMBDA LIGHT CHAINS
Kappa free light chain: 132 mg/L — ABNORMAL HIGH (ref 3.3–19.4)
Kappa, lambda light chain ratio: 11.58 — ABNORMAL HIGH (ref 0.26–1.65)
Lambda free light chains: 11.4 mg/L (ref 5.7–26.3)

## 2022-05-21 ENCOUNTER — Other Ambulatory Visit: Payer: Self-pay | Admitting: *Deleted

## 2022-05-21 DIAGNOSIS — D472 Monoclonal gammopathy: Secondary | ICD-10-CM | POA: Diagnosis not present

## 2022-05-21 LAB — MULTIPLE MYELOMA PANEL, SERUM
Albumin SerPl Elph-Mcnc: 4 g/dL (ref 2.9–4.4)
Albumin/Glob SerPl: 1.2 (ref 0.7–1.7)
Alpha 1: 0.2 g/dL (ref 0.0–0.4)
Alpha2 Glob SerPl Elph-Mcnc: 0.7 g/dL (ref 0.4–1.0)
B-Globulin SerPl Elph-Mcnc: 0.9 g/dL (ref 0.7–1.3)
Gamma Glob SerPl Elph-Mcnc: 1.8 g/dL (ref 0.4–1.8)
Globulin, Total: 3.6 g/dL (ref 2.2–3.9)
IgA: 122 mg/dL (ref 61–437)
IgG (Immunoglobin G), Serum: 2030 mg/dL — ABNORMAL HIGH (ref 603–1613)
IgM (Immunoglobulin M), Srm: 49 mg/dL (ref 20–172)
M Protein SerPl Elph-Mcnc: 1.4 g/dL — ABNORMAL HIGH
Total Protein ELP: 7.6 g/dL (ref 6.0–8.5)

## 2022-05-21 LAB — BETA 2 MICROGLOBULIN, SERUM: Beta-2 Microglobulin: 2.3 mg/L (ref 0.6–2.4)

## 2022-05-22 ENCOUNTER — Telehealth: Payer: Self-pay

## 2022-05-22 NOTE — Telephone Encounter (Signed)
-----  Message from [name], MD sent at 05/22/2022  7:59 AM EST ----- PLs call him or wife Repeat labs confirmed similar M protein Do they want to proceed with bone marrow biopsy and if yes, do they want it done sooner? If he wants to keep his biopsy as is, can you please schedule return follow-up at 245 pm on 12/22?

## 2022-05-22 NOTE — Telephone Encounter (Signed)
Called and scheduled appt with Dr. Alvy Bimler on 12/19 at 2:30 pm. He verbalized understanding.

## 2022-05-22 NOTE — Telephone Encounter (Signed)
Called and given below message. He verbalized understanding. Given radiology scheduling phone #, he will call to try to get a earlier appt for bone marrow biopsy. He will call the office back.

## 2022-05-23 DIAGNOSIS — H1045 Other chronic allergic conjunctivitis: Secondary | ICD-10-CM | POA: Diagnosis not present

## 2022-05-23 DIAGNOSIS — H00025 Hordeolum internum left lower eyelid: Secondary | ICD-10-CM | POA: Diagnosis not present

## 2022-05-23 LAB — UPEP/UIFE/LIGHT CHAINS/TP, 24-HR UR
% BETA, Urine: 21.7 %
ALPHA 1 URINE: 7.4 %
Albumin, U: 42.8 %
Alpha 2, Urine: 17.3 %
Free Kappa Lt Chains,Ur: 58.4 mg/L (ref 1.17–86.46)
Free Kappa/Lambda Ratio: 6.8 (ref 1.83–14.26)
Free Lambda Lt Chains,Ur: 8.59 mg/L (ref 0.27–15.21)
GAMMA GLOBULIN URINE: 10.8 %
Total Protein, Urine-Ur/day: 156 mg/24 hr — ABNORMAL HIGH (ref 30–150)
Total Protein, Urine: 12 mg/dL
Total Volume: 1250

## 2022-05-28 ENCOUNTER — Ambulatory Visit (HOSPITAL_COMMUNITY)
Admission: RE | Admit: 2022-05-28 | Discharge: 2022-05-28 | Disposition: A | Discharge status: Home / Self Care | Payer: Medicare Other | Source: Ambulatory Visit | Attending: Hematology and Oncology | Admitting: Hematology and Oncology

## 2022-05-28 DIAGNOSIS — D472 Monoclonal gammopathy: Secondary | ICD-10-CM | POA: Insufficient documentation

## 2022-05-28 DIAGNOSIS — C9000 Multiple myeloma not having achieved remission: Secondary | ICD-10-CM | POA: Diagnosis not present

## 2022-05-30 ENCOUNTER — Other Ambulatory Visit (HOSPITAL_COMMUNITY): Payer: Self-pay | Admitting: Physician Assistant

## 2022-05-30 DIAGNOSIS — Z01818 Encounter for other preprocedural examination: Secondary | ICD-10-CM

## 2022-06-02 ENCOUNTER — Ambulatory Visit (HOSPITAL_COMMUNITY)
Admission: RE | Admit: 2022-06-02 | Discharge: 2022-06-02 | Disposition: A | Discharge status: Home / Self Care | Payer: Medicare Other | Source: Ambulatory Visit | Attending: Hematology and Oncology | Admitting: Hematology and Oncology

## 2022-06-02 ENCOUNTER — Encounter (HOSPITAL_COMMUNITY): Payer: Self-pay

## 2022-06-02 DIAGNOSIS — D472 Monoclonal gammopathy: Secondary | ICD-10-CM | POA: Diagnosis not present

## 2022-06-02 DIAGNOSIS — Z01818 Encounter for other preprocedural examination: Secondary | ICD-10-CM

## 2022-06-02 DIAGNOSIS — Z1379 Encounter for other screening for genetic and chromosomal anomalies: Secondary | ICD-10-CM | POA: Insufficient documentation

## 2022-06-02 DIAGNOSIS — D649 Anemia, unspecified: Secondary | ICD-10-CM | POA: Insufficient documentation

## 2022-06-02 LAB — CBC WITH DIFFERENTIAL/PLATELET
Abs Immature Granulocytes: 0.03 10*3/uL (ref 0.00–0.07)
Basophils Absolute: 0 10*3/uL (ref 0.0–0.1)
Basophils Relative: 0 %
Eosinophils Absolute: 0.1 10*3/uL (ref 0.0–0.5)
Eosinophils Relative: 1 %
HCT: 34.2 % — ABNORMAL LOW (ref 39.0–52.0)
Hemoglobin: 11 g/dL — ABNORMAL LOW (ref 13.0–17.0)
Immature Granulocytes: 1 %
Lymphocytes Relative: 31 %
Lymphs Abs: 1.5 10*3/uL (ref 0.7–4.0)
MCH: 28.8 pg (ref 26.0–34.0)
MCHC: 32.2 g/dL (ref 30.0–36.0)
MCV: 89.5 fL (ref 80.0–100.0)
Monocytes Absolute: 0.6 10*3/uL (ref 0.1–1.0)
Monocytes Relative: 13 %
Neutro Abs: 2.5 10*3/uL (ref 1.7–7.7)
Neutrophils Relative %: 54 %
Platelets: 205 10*3/uL (ref 150–400)
RBC: 3.82 MIL/uL — ABNORMAL LOW (ref 4.22–5.81)
RDW: 13.4 % (ref 11.5–15.5)
WBC: 4.7 10*3/uL (ref 4.0–10.5)
nRBC: 0 % (ref 0.0–0.2)

## 2022-06-02 LAB — GLUCOSE, CAPILLARY: Glucose-Capillary: 116 mg/dL — ABNORMAL HIGH (ref 70–99)

## 2022-06-02 MED ORDER — FLUMAZENIL 0.5 MG/5ML IV SOLN
INTRAVENOUS | Status: AC
  Filled 2022-06-02: qty 5

## 2022-06-02 MED ORDER — SODIUM CHLORIDE 0.9 % IV SOLN: INTRAVENOUS | Status: DC

## 2022-06-02 MED ORDER — MIDAZOLAM HCL 2 MG/2ML IJ SOLN
INTRAMUSCULAR | Status: AC
  Filled 2022-06-02: qty 4

## 2022-06-02 MED ORDER — LIDOCAINE HCL (PF) 1 % IJ SOLN
INTRAMUSCULAR | Status: AC | PRN
  Administered 2022-06-02: 10 mL

## 2022-06-02 MED ORDER — MIDAZOLAM HCL 2 MG/2ML IJ SOLN
INTRAMUSCULAR | Status: AC | PRN
  Administered 2022-06-02: 1 mg via INTRAVENOUS
  Administered 2022-06-02 (×2): .5 mg via INTRAVENOUS

## 2022-06-02 MED ORDER — FENTANYL CITRATE (PF) 100 MCG/2ML IJ SOLN
INTRAMUSCULAR | Status: AC | PRN
  Administered 2022-06-02 (×2): 25 ug via INTRAVENOUS
  Administered 2022-06-02: 50 ug via INTRAVENOUS

## 2022-06-02 MED ORDER — NALOXONE HCL 0.4 MG/ML IJ SOLN
INTRAMUSCULAR | Status: AC
  Filled 2022-06-02: qty 1

## 2022-06-02 MED ORDER — FENTANYL CITRATE (PF) 100 MCG/2ML IJ SOLN
INTRAMUSCULAR | Status: AC
  Filled 2022-06-02: qty 4

## 2022-06-02 NOTE — Sedation Documentation (Signed)
Sample obtained 

## 2022-06-02 NOTE — Consult Note (Signed)
Chief Complaint: Patient was seen in consultation today for CT guided bone marrow biopsy  Referring Physician(s): Gorsuch,Ni  Supervising Physician: Markus Daft  Patient Status: [facility] - Out-pt  History of Present Illness: William Mcguire is a 66 y.o. male with PMH sig for DM,HTN, HLD who presents now with MGUS, anemia and renal insufficiency. He is scheduled today for CT guided bone marrow biopsy for further evaluation.   Past Medical History:  Diagnosis Date   Diabetes mellitus    High cholesterol    Hypertension     Past Surgical History:  Procedure Laterality Date   KNEE SURGERY     torn cartilage   PATELLAR TENDON REPAIR  06/11/2011   Procedure: PATELLA TENDON REPAIR;  Surgeon: Meredith Pel;  Location: [facility] ORS;  Service: Orthopedics;  Laterality: Right;    Allergies: Patient has no known allergies.  Medications: Prior to Admission medications   Medication Sig Start Date End Date Taking? Authorizing Provider  amLODipine (NORVASC) 10 MG tablet Take 1 tablet (10 mg total) by mouth daily. 03/31/22  Yes [name], MD  aspirin EC 81 MG tablet Take 81 mg by mouth daily.   Yes [provider]  atorvastatin (LIPITOR) 40 MG tablet Take 1 tablet (40 mg total) by mouth daily. 03/31/22  Yes [name], MD  B-D ULTRAFINE III SHORT PEN 31G X 8 MM MISC USE AS DIRECTED 08/14/21  Yes [name], MD  cyanocobalamin (,VITAMIN B-12,) 1000 MCG/ML injection Inject 1 mL (1,000 mcg total) into the muscle every 30 (thirty) days. 06/03/21  Yes [name], MD  Dulaglutide 1.5 MG/0.5ML SOPN Inject 1.5 mg into the skin once a week. 03/31/22  Yes [name], MD  enalapril (VASOTEC) 10 MG tablet TAKE 1 TABLET BY MOUTH EVERY DAY 09/23/21  Yes [name], MD  glucose blood (ONETOUCH VERIO) test strip Use as instructed to check blood sugars twice daily E11.69 11/26/21  Yes [name], MD  glucose blood test strip Use as instructed to test blood sugar 3 times a day.  Dx code: e11.65 11/26/21  Yes [name], MD  Insulin Glargine [facility]) 100 UNIT/ML Inject 0.22 mLs (22 Units total) into the skin at bedtime. 12/02/19  Yes [name], MD  metFORMIN (GLUCOPHAGE) 1000 MG tablet TAKE 1 TABLET BY MOUTH TWICE A DAY WITH FOOD 04/18/22  Yes [name], MD  sildenafil (VIAGRA) 100 MG tablet TAKE 1 TABLET BY MOUTH EVERY DAY AS NEEDED 01/14/22  Yes [name], MD  SYRINGE-NEEDLE, DISP, 3 ML (B-D INTEGRA SYRINGE) 25G X 5/8" 3 ML MISC USE AS DIRECTED 03/12/22  Yes [name], MD     Family History  Problem Relation Age of Onset   Hypertension Mother    Multiple myeloma Mother    Hypertension Father    Diabetes Father     Social History   Socioeconomic History   Marital status: Married    Spouse name: Not on file   Number of children: Not on file   Years of education: Not on file   Highest education level: Not on file  Occupational History   Not on file  Tobacco Use   Smoking status: Never   Smokeless tobacco: Never   Tobacco comments:    n/a  Vaping Use   Vaping Use: Never used  Substance and Sexual Activity   Alcohol use: No   Drug use: No   Sexual activity: Not on file  Other Topics Concern   Not on file  Social  History Narrative   Not on file   Social Determinants of Health   Financial Resource Strain: Low Risk  (10/30/2021)   Overall Financial Resource Strain (CARDIA)    Difficulty of Paying Living Expenses: Not hard at all  Food Insecurity: No Food Insecurity (10/30/2021)   Hunger Vital Sign    Worried About Running Out of Food in the Last Year: Never true    Ran Out of Food in the Last Year: Never true  Transportation Needs: No Transportation Needs (10/30/2021)   PRAPARE - Hydrologist (Medical): No    Lack of Transportation (Non-Medical): No  Physical Activity: Insufficiently Active (10/30/2021)   Exercise Vital Sign    Days of Exercise per Week: 3 days    Minutes of Exercise per  Session: 30 min  Stress: No Stress Concern Present (10/30/2021)   [place]    Feeling of Stress : Not at all  Social Connections: Not on file     Review of Systems denies fever,HA,CP,dyspnea, cough, abdominal pain, back pain, nausea, vomiting or bleeding  Vital Signs: BP (!) 165/82 (BP Location: Left Arm)   Pulse 60   Temp 98.1 F (36.7 C) (Oral)   Resp 18   Ht _0  (1.905 m)   Wt 255 lb 11.7 oz (116 kg)   SpO2 99%   BMI 31.96 kg/m     Physical Exam awake, alert.  Chest clear to bilaterally.  Heart with regular rate and rhythm.  Abdomen soft, positive bowel sounds, nontender.  No significant lower extremity edema.  Imaging: DG Bone Survey Met  Result Date: 05/29/2022 CLINICAL DATA:  Monoclonal gammopathy of unknown significance, staging myeloma. EXAM: METASTATIC BONE SURVEY COMPARISON:  11/22/2020, 07/04/2020 FINDINGS: No lytic or destructive lesion is seen within the bones. Degenerative changes are present at the acromioclavicular and glenohumeral joints bilaterally. Mild degenerative changes are present at the elbows. Degenerative changes are noted in the cervical, thoracic, and lumbar spine. Moderate degenerative changes are present at the knees bilaterally. There is a nodular opacity in the hilar region on the left measuring 2.9 cm, compatible with known left upper lobe pulmonary nodule. IMPRESSION: 1. No lytic or destructive lesions within the bones. 2. 2.9 cm nodular opacity in the hilar region on the left, unchanged from recent CT. 3. Scattered degenerative changes. Electronically Signed   By: Brett Fairy M.D.   On: 05/29/2022 21:54    Labs:  CBC: Recent Labs    10/07/21 1205 03/31/22 1659 05/19/22 1156 06/02/22 0704  WBC 4.8 6.0 4.9 4.7  HGB 12.6* 12.1* 11.6* 11.0*  HCT 37.9 36.0* 35.2* 34.2*  PLT 219 236 187 205    COAGS: No results for input(s): "INR", "APTT" in the last 8760  hours.  BMP: Recent Labs    06/04/21 1114 10/07/21 1205 03/31/22 1659 05/19/22 1156  NA 137 143 139 136  K 4.7 5.1 4.3 4.0  CL 101 108* 104 104  CO2 _1 GLUCOSE 77 93 126* 132*  BUN _2 CALCIUM 9.9 9.8 9.7 10.0  CREATININE 1.20 1.15 1.42* 1.29*  GFRNONAA  --   --   --  >60    LIVER FUNCTION TESTS: Recent Labs    10/07/21 1205 03/31/22 1659 04/17/22 0849 05/19/22 1156  BILITOT 0.3 0.2  --  0.5  AST 23 26  --  23  ALT 19 15  --  23  ALKPHOS 110 105  --  91  PROT 7.6 7.9 7.3 8.0  ALBUMIN 4.5 4.4  --  4.4    TUMOR MARKERS: No results for input(s): "AFPTM", "CEA", "CA199", "CHROMGRNA" in the last 8760 hours.  Assessment and Plan: 66 y.o. male with PMH sig for DM,HTN, HLD who presents now with MGUS, anemia and renal insufficiency. He is scheduled today for CT guided bone marrow biopsy for further evaluation. Risks and benefits of procedure was discussed with the patient  including, but not limited to bleeding, infection, damage to adjacent structures or low yield requiring additional tests.  All of the questions were answered and there is agreement to proceed.  Consent signed and in chart.    Thank you for this interesting consult.  I greatly enjoyed meeting William Mcguire and look forward to participating in their care.  A copy of this report was sent to the requesting provider on this date.  Electronically Signed: D. Rowe Robert, PA-C 06/02/2022, 8:00 AM   I spent a total of 20 minutes    in face to face in clinical consultation, greater than 50% of which was counseling/coordinating care for CT guided bone marrow biopsy

## 2022-06-02 NOTE — Procedures (Signed)
Interventional Radiology Procedure:   Indications: MGUS  Procedure: CT guided bone marrow biopsy  Findings: 2 aspirates and 1 core from right ilium  Complications: None     EBL: Minimal, less than 10 ml  Plan: Discharge to home in one hour.   Ketrina Boateng R. Lounell Schumacher, MD  Pager: [phone]   

## 2022-06-02 NOTE — Discharge Instructions (Signed)
Please call Interventional Radiology clinic [phone] with any questions or concerns.  You may remove your dressing and shower tomorrow.  Moderate Conscious Sedation, Adult, Care After This sheet gives you information about how to care for yourself after your procedure. Your health care provider may also give you more specific instructions. If you have problems or questions, contact your health careprovider. What can I expect after the procedure? After the procedure, it is common to have: Sleepiness for several hours. Impaired judgment for several hours. Difficulty with balance. Vomiting if you eat too soon. Follow these instructions at home: For the time period you were told by your health care provider: Rest. Do not participate in activities where you could fall or become injured. Do not drive or use machinery. Do not drink alcohol. Do not take sleeping pills or medicines that cause drowsiness. Do not make important decisions or sign legal documents. Do not take care of children on your own. Eating and drinking  Follow the diet recommended by your health care provider. Drink enough fluid to keep your urine pale yellow. If you vomit: Drink water, juice, or soup when you can drink without vomiting. Make sure you have little or no nausea before eating solid foods.  General instructions Take over-the-counter and prescription medicines only as told by your health care provider. Have a responsible adult stay with you for the time you are told. It is important to have someone help care for you until you are awake and alert. Do not smoke. Keep all follow-up visits as told by your health care provider. This is important. Contact a health care provider if: You are still sleepy or having trouble with balance after 24 hours. You feel light-headed. You keep feeling nauseous or you keep vomiting. You develop a rash. You have a fever. You have redness or swelling around the IV  site. Get help right away if: You have trouble breathing. You have new-onset confusion at home. Summary After the procedure, it is common to feel sleepy, have impaired judgment, or feel nauseous if you eat too soon. Rest after you get home. Know the things you should not do after the procedure. Follow the diet recommended by your health care provider and drink enough fluid to keep your urine pale yellow. Get help right away if you have trouble breathing or new-onset confusion at home. This information is not intended to replace advice given to you by your health care provider. Make sure you discuss any questions you have with your healthcare provider. Document Revised: 10/07/2019 Document Reviewed: 05/05/2019 Elsevier Patient Education  2022 [place].  Bone Marrow Aspiration and Bone Marrow Biopsy, Adult, Care After This sheet gives you information about how to care for yourself after your procedure. Your health care provider may also give you more specific instructions. If you have problems or questions, contact your health careprovider. What can I expect after the procedure? After the procedure, it is common to have: Mild pain and tenderness. Swelling. Bruising. Follow these instructions at home: Puncture site care Follow instructions from your health care provider about how to take care of the puncture site. Make sure you: Wash your hands with soap and water before and after you change your bandage (dressing). If soap and water are not available, use hand sanitizer. Change your dressing as told by your health care provider. Check your puncture site every day for signs of infection. Check for: More redness, swelling, or pain. Fluid or blood. Warmth. Pus or a bad smell.  Activity Return to your normal activities as told by your health care provider. Ask your health care provider what activities are safe for you. Do not lift anything that is heavier than 10 lb (4.5 kg), or the  limit that you are told, until your health care provider says that it is safe. Do not drive for 24 hours if you were given a sedative during your procedure. General instructions Take over-the-counter and prescription medicines only as told by your health care provider. Do not take baths, swim, or use a hot tub until your health care provider approves. Ask your health care provider if you may take showers. You may only be allowed to take sponge baths. If directed, put ice on the affected area. To do this: Put ice in a plastic bag. Place a towel between your skin and the bag. Leave the ice on for 20 minutes, 2-3 times a day. Keep all follow-up visits as told by your health care provider. This is important.  Contact a health care provider if: Your pain is not controlled with medicine. You have a fever. You have more redness, swelling, or pain around the puncture site. You have fluid or blood coming from the puncture site. Your puncture site feels warm to the touch. You have pus or a bad smell coming from the puncture site. Summary After the procedure, it is common to have mild pain, tenderness, swelling, and bruising. Follow instructions from your health care provider about how to take care of the puncture site and what activities are safe for you. Take over-the-counter and prescription medicines only as told by your health care provider. Contact a health care provider if you have any signs of infection, such as fluid or blood coming from the puncture site. This information is not intended to replace advice given to you by your health care provider. Make sure you discuss any questions you have with your healthcare provider. Document Revised: 10/26/2018 Document Reviewed: 10/26/2018 Elsevier Patient Education  2022 [employer] 

## 2022-06-05 ENCOUNTER — Telehealth: Payer: Self-pay

## 2022-06-05 ENCOUNTER — Encounter: Payer: Self-pay | Admitting: Hematology and Oncology

## 2022-06-05 LAB — SURGICAL PATHOLOGY

## 2022-06-05 NOTE — Chronic Care Management (AMB) (Signed)
Faxed 2024 re-enrollment application to [facility] patient assistance for Trulicity.    Pattricia Boss, [place] Pharmacist Assistant [phone]

## 2022-06-06 ENCOUNTER — Ambulatory Visit (HOSPITAL_COMMUNITY): Payer: Medicare Other

## 2022-06-10 ENCOUNTER — Inpatient Hospital Stay: Payer: Medicare Other | Attending: Hematology and Oncology | Admitting: Hematology and Oncology

## 2022-06-10 VITALS — BP 169/76 | HR 68 | Temp 97.8°F | Resp 18 | Ht 75.0 in | Wt 256.6 lb

## 2022-06-10 DIAGNOSIS — D472 Monoclonal gammopathy: Secondary | ICD-10-CM | POA: Diagnosis not present

## 2022-06-10 DIAGNOSIS — I129 Hypertensive chronic kidney disease with stage 1 through stage 4 chronic kidney disease, or unspecified chronic kidney disease: Secondary | ICD-10-CM | POA: Diagnosis not present

## 2022-06-10 DIAGNOSIS — N182 Chronic kidney disease, stage 2 (mild): Secondary | ICD-10-CM | POA: Insufficient documentation

## 2022-06-10 DIAGNOSIS — D539 Nutritional anemia, unspecified: Secondary | ICD-10-CM | POA: Insufficient documentation

## 2022-06-10 DIAGNOSIS — R918 Other nonspecific abnormal finding of lung field: Secondary | ICD-10-CM

## 2022-06-11 ENCOUNTER — Encounter: Payer: Self-pay | Admitting: Hematology and Oncology

## 2022-06-11 NOTE — Assessment & Plan Note (Signed)
This is due to anemia chronic illness We will observe

## 2022-06-11 NOTE — Assessment & Plan Note (Signed)
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 

## 2022-06-11 NOTE — Assessment & Plan Note (Signed)
This is multifactorial He does have history of diabetes and hypertension We discussed importance of hydration and risk factor modification and lifestyle changes 

## 2022-06-11 NOTE — Progress Notes (Signed)
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 and his wife Currently, the patient has smoldering myeloma but without any organ damage such as hypercalcemia or bone lesions He is mildly elevated serum creatinine is not related The mild anemia is caused by anemia chronic illness He does not need treatment We discussed natural history of MGUS and smoldering myeloma I plan to see him every 3 months for the first 2 years for assessment The patient is warned of signs and symptoms of disease progression We discussed importance of lifestyle changes and risk factor modification  Chronic renal disease, stage II This is multifactorial He does have history of diabetes and hypertension We discussed importance of hydration and risk factor modification and lifestyle changes  Deficiency anemia This is due to anemia chronic illness We will observe  Abnormal CT scan of lung I recommend close follow-up with repeat imaging study June 2024  Orders Placed This Encounter  Procedures   CBC with Differential ([place] Only)    Standing Status:   Future    Standing Expiration Date:   06/12/2023   CMP ([place] only)    Standing Status:   Future    Standing Expiration Date:   06/12/2023   Kappa/lambda light chains    Standing Status:   Standing    Number of Occurrences:   22    Standing Expiration Date:   06/12/2023   Multiple Myeloma Panel (SPEP&IFE w/QIG)    Standing Status:   Standing    Number of Occurrences:   22    Standing Expiration Date:   06/12/2023    All questions were answered. The patient knows to call the clinic with any problems, questions or concerns. The total time spent in the appointment was 30 minutes encounter with patients including review of chart and various tests results, discussions about  plan of care and coordination of care plan   [name], MD 06/11/2022 2:29 PM  INTERVAL HISTORY: Please see below for problem oriented charting. he returns for review of test results He tolerated bone marrow biopsy well He is not symptomatic He was known to have abnormal lung imaging a year and a half ago He denies new symptoms  REVIEW OF SYSTEMS:   Constitutional: Denies fevers, chills or abnormal weight loss Eyes: Denies blurriness of vision Ears, nose, mouth, throat, and face: Denies mucositis or sore throat Respiratory: Denies cough, dyspnea or wheezes Cardiovascular: Denies palpitation, chest discomfort or lower extremity swelling Gastrointestinal:  Denies nausea, heartburn or change in bowel habits Skin: Denies abnormal skin rashes Lymphatics: Denies new lymphadenopathy or easy bruising Neurological:Denies numbness, tingling or new weaknesses Behavioral/Psych: Mood is stable, no new changes  All other systems were reviewed with the patient and are negative.  I have reviewed the past medical history, past surgical history, social history and family history with the patient and they are unchanged from previous note.  ALLERGIES:  has No Known Allergies.  MEDICATIONS:  Current Outpatient Medications  Medication Sig Dispense Refill   amLODipine (NORVASC) 10 MG tablet Take 1 tablet (10 mg total) by mouth daily. 90 tablet 1   aspirin EC 81 MG tablet Take 81 mg by mouth daily.     atorvastatin (LIPITOR) 40 MG tablet Take 1 tablet (40 mg total) by mouth daily. 90 tablet 1   B-D ULTRAFINE III  SHORT PEN 31G X 8 MM MISC USE AS DIRECTED 100 each PRN   cyanocobalamin (,VITAMIN B-12,) 1000 MCG/ML injection Inject 1 mL (1,000 mcg total) into the muscle every 30 (thirty) days. 9 mL 1   Dulaglutide 1.5 MG/0.5ML SOPN Inject 1.5 mg into the skin once a week. 6 mL 2   enalapril (VASOTEC) 10 MG tablet TAKE 1 TABLET BY MOUTH EVERY DAY 90 tablet 2   glucose blood (ONETOUCH VERIO) test strip  Use as instructed to check blood sugars twice daily E11.69 100 each 2   glucose blood test strip Use as instructed to test blood sugar 3 times a day. Dx code: e11.65 100 each 2   Insulin Glargine ([name] KWIKPEN) 100 UNIT/ML Inject 0.22 mLs (22 Units total) into the skin at bedtime. 45 mL 3   metFORMIN (GLUCOPHAGE) 1000 MG tablet TAKE 1 TABLET BY MOUTH TWICE A DAY WITH FOOD 180 tablet 0   sildenafil (VIAGRA) 100 MG tablet TAKE 1 TABLET BY MOUTH EVERY DAY AS NEEDED 10 tablet 5   SYRINGE-NEEDLE, DISP, 3 ML (B-D INTEGRA SYRINGE) 25G X 5/8" 3 ML MISC USE AS DIRECTED 12 each 24   Current Facility-Administered Medications  Medication Dose Route Frequency Provider Last Rate Last Admin   cyanocobalamin ((VITAMIN B-12)) injection 1,000 mcg  1,000 mcg Intramuscular Once [name], MD        SUMMARY OF ONCOLOGIC HISTORY: Oncology History  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 No lytic or destructive lesions within the bones. 2. 2.9 cm nodular opacity in the hilar region on the left, unchanged from recent CT. 3. Scattered degenerative changes.   06/02/2022 Bone Marrow Biopsy   BONE MARROW, ASPIRATE, CLOT, CORE: -Variably cellular bone marrow involved by plasma cell neoplasm (10% plasma cells by manual aspirate differential, approximately 10 to 15% by CD138 immunohistochemical stains on clot and core sections, and kappa restricted by kappa/lambda in situ hybridization.).  See comment.  PERIPHERAL BLOOD: -Normocytic normochromic anemia  COMMENT:   Morphological evaluation of the bone marrow clot sections reveals multiple lymphoid aggregates in addition to increased plasma cells.  CD3 and CD20 stains are attempted, however the lymphoid aggregates are lost on deeper levels with a single lymphoid aggregate showing slightly more CD20 staining than CD3.  While flow cytometry is negative for a clonal B-cell population, close clinical and imaging follow-up is recommended along with assessment of  peripheral blood and potential sites of lymphadenopathy/organomegaly as clinically indicated.   MICROSCOPIC DESCRIPTION:  PERIPHERAL BLOOD SMEAR: Platelets: Adequate, no platelet clumps identified Erythroid: Normocytic normochromic anemia Leukocytes: Adequate, negative for dysplastic granulocytes, blasts or plasma cells.  Plasmacytoid lymphocytes present.  BONE MARROW ASPIRATE: Cellular Erythroid precursors: Erythroid precursors show a full sequence of generally orderly maturation Granulocytic precursors: Granulocytic precursors show full sequence of generally orderly maturation Megakaryocytes: Typical in number and morphology Lymphocytes/plasma cells: Plasma cells are increased  TOUCH PREPARATIONS: Confirmatory of the aspirate findings  CLOT AND BIOPSY: The bone marrow clot and biopsy reveal a variably cellular bone marrow (20 to 60%) with trilineage hematopoiesis.  Plasma cells are increased and present singly scattered and in clusters.  Also present are loosely circumscribed lymphoid aggregates with small mature lymphocytes.  The findings are confirmatory of the aspirate and touch prep impression.  SPECIAL stains: CD138: CD138 highlights plasma cells, approximately 10% of cellularity in clot and core sections Kappa/lambda ISH: Kappa/lambda ISH shows kappa restricted plasma cells CD3: CD3 highlights T lymphocytes in the lymphoid aggregates CD20: CD20 highlights B lymphocytes  in lymphoid aggregates (slightly more than CD3) CD56: CD56 is positive in the plasma cells MUM 1: MUM1 is positive in the plasma cells IRON STAIN: Iron stains are performed on a bone marrow aspirate or touch imprint smear and section of clot. The controls stained appropriately.       Storage Iron: Scant      Ring Sideroblasts: Not identified  ADDITIONAL DATA/TESTING: Multiple myeloma panel  CELL COUNT DATA:  Bone Marrow count performed on 500 cells shows: Blasts:   0%   Myeloid:  43% Promyelocytes: 0%    Erythroid:     37% Myelocytes:    5%   Lymphocytes:   7% Metamyelocytes:     2%   Plasma cells:  10% Bands:    4% Neutrophils:   30%  M:E ratio:     1.16 Eosinophils:   2% Basophils:     0% Monocytes:     3%      PHYSICAL EXAMINATION: ECOG PERFORMANCE STATUS: 0 - Asymptomatic  Vitals:   06/10/22 1421  BP: (!) 169/76  Pulse: 68  Resp: 18  Temp: 97.8 F (36.6 C)  SpO2: 100%   Filed Weights   06/10/22 1421  Weight: 256 lb 9.6 oz (116.4 kg)    GENERAL:alert, no distress and comfortable  NEURO: alert & oriented x 3 with fluent speech, no focal motor/sensory deficits  LABORATORY DATA:  I have reviewed the data as listed    Component Value Date/Time   NA 136 05/19/2022 1156   NA 139 03/31/2022 1659   K 4.0 05/19/2022 1156   CL 104 05/19/2022 1156   CO2 28 05/19/2022 1156   GLUCOSE 132 (H) 05/19/2022 1156   BUN 15 05/19/2022 1156   BUN 12 03/31/2022 1659   CREATININE 1.29 (H) 05/19/2022 1156   CALCIUM 10.0 05/19/2022 1156   PROT 8.0 05/19/2022 1156   PROT 7.3 04/17/2022 0849   ALBUMIN 4.4 05/19/2022 1156   ALBUMIN 4.4 03/31/2022 1659   AST 23 05/19/2022 1156   ALT 23 05/19/2022 1156   ALKPHOS 91 05/19/2022 1156   BILITOT 0.5 05/19/2022 1156   GFRNONAA >60 05/19/2022 1156   GFRAA 70 08/06/2020 1044    No results found for: "SPEP", "UPEP"  Lab Results  Component Value Date   WBC 4.7 06/02/2022   NEUTROABS 2.5 06/02/2022   HGB 11.0 (L) 06/02/2022   HCT 34.2 (L) 06/02/2022   MCV 89.5 06/02/2022   PLT 205 06/02/2022      Chemistry      Component Value Date/Time   NA 136 05/19/2022 1156   NA 139 03/31/2022 1659   K 4.0 05/19/2022 1156   CL 104 05/19/2022 1156   CO2 28 05/19/2022 1156   BUN 15 05/19/2022 1156   BUN 12 03/31/2022 1659   CREATININE 1.29 (H) 05/19/2022 1156      Component Value Date/Time   CALCIUM 10.0 05/19/2022 1156   ALKPHOS 91 05/19/2022 1156   AST 23 05/19/2022 1156   ALT 23 05/19/2022 1156   BILITOT 0.5 05/19/2022 1156        RADIOGRAPHIC STUDIES: I have reviewed imaging study with the patient and his wife I have personally reviewed the radiological images as listed and agreed with the findings in the report. CT BONE MARROW BIOPSY & ASPIRATION  Result Date: 06/02/2022 INDICATION: 66 year old with monoclonal gammopathy of unknown significance. EXAM: CT GUIDED BONE MARROW ASPIRATES AND BIOPSY Physician: Stephan Minister. Anselm Pancoast, MD MEDICATIONS: None. ANESTHESIA/SEDATION: Moderate (conscious) sedation was employed  during this procedure. A total of Versed 2 mgmg and fentanyl 100 mcg was administered intravenously at the order of the provider performing the procedure. Total intra-service moderate sedation time: 11 minutes. Patient's level of consciousness and vital signs were monitored continuously by radiology nurse throughout the procedure under the supervision of the provider performing the procedure. COMPLICATIONS: None immediate. PROCEDURE: The procedure was explained to the patient. The risks and benefits of the procedure were discussed and the patient's questions were addressed. Informed consent was obtained from the patient. The patient was placed prone on CT table. Images of the pelvis were obtained. The right side of back was prepped and draped in sterile fashion. The skin and right posterior ilium were anesthetized with 1% lidocaine. 11 gauge bone needle was directed into the right ilium with CT guidance. Two aspirates and one core biopsy were obtained. Bandage placed over the puncture site. RADIATION DOSE REDUCTION: This exam was performed according to the departmental dose-optimization program which includes automated exposure control, adjustment of the mA and/or kV according to patient size and/or use of iterative reconstruction technique. FINDINGS: Subtle areas of lucency in the pelvic bones are nonspecific. Biopsy needle directed into the posterior right ilium. IMPRESSION: CT guided bone marrow aspiration and core biopsy.  Electronically Signed   By: Markus Daft M.D.   On: 06/02/2022 12:35   DG Bone Survey Met  Result Date: 05/29/2022 CLINICAL DATA:  Monoclonal gammopathy of unknown significance, staging myeloma. EXAM: METASTATIC BONE SURVEY COMPARISON:  11/22/2020, 07/04/2020 FINDINGS: No lytic or destructive lesion is seen within the bones. Degenerative changes are present at the acromioclavicular and glenohumeral joints bilaterally. Mild degenerative changes are present at the elbows. Degenerative changes are noted in the cervical, thoracic, and lumbar spine. Moderate degenerative changes are present at the knees bilaterally. There is a nodular opacity in the hilar region on the left measuring 2.9 cm, compatible with known left upper lobe pulmonary nodule. IMPRESSION: 1. No lytic or destructive lesions within the bones. 2. 2.9 cm nodular opacity in the hilar region on the left, unchanged from recent CT. 3. Scattered degenerative changes. Electronically Signed   By: Brett Fairy M.D.   On: 05/29/2022 21:54

## 2022-06-11 NOTE — Assessment & Plan Note (Signed)
I recommend close follow-up with repeat imaging study June 2024

## 2022-06-12 ENCOUNTER — Encounter (HOSPITAL_COMMUNITY): Payer: Self-pay | Admitting: Hematology and Oncology

## 2022-06-12 ENCOUNTER — Telehealth: Payer: Self-pay | Admitting: Hematology and Oncology

## 2022-06-12 NOTE — Telephone Encounter (Signed)
Patient's spouse called to r/s march appointment to earlier time. R/s patient's appointment. Patient will be notified.

## 2022-06-19 ENCOUNTER — Telehealth: Payer: Self-pay

## 2022-06-19 NOTE — Chronic Care Management (AMB) (Signed)
[facility] patient assistance notification:   Patient approved to receive Trulicity for 0979 calendar enrollment year.   Pattricia Boss, [facility] Pharmacist Assistant [phone]'

## 2022-06-24 DIAGNOSIS — H524 Presbyopia: Secondary | ICD-10-CM | POA: Diagnosis not present

## 2022-06-24 DIAGNOSIS — H52203 Unspecified astigmatism, bilateral: Secondary | ICD-10-CM | POA: Diagnosis not present

## 2022-06-24 DIAGNOSIS — H25013 Cortical age-related cataract, bilateral: Secondary | ICD-10-CM | POA: Diagnosis not present

## 2022-06-24 DIAGNOSIS — H2513 Age-related nuclear cataract, bilateral: Secondary | ICD-10-CM | POA: Diagnosis not present

## 2022-06-24 DIAGNOSIS — E113291 Type 2 diabetes mellitus with mild nonproliferative diabetic retinopathy without macular edema, right eye: Secondary | ICD-10-CM | POA: Diagnosis not present

## 2022-06-24 LAB — HM DIABETES EYE EXAM

## 2022-06-26 ENCOUNTER — Other Ambulatory Visit: Payer: Self-pay | Admitting: Internal Medicine

## 2022-07-01 NOTE — Telephone Encounter (Signed)
Chmg-error.  

## 2022-07-03 ENCOUNTER — Encounter: Payer: Self-pay | Admitting: Internal Medicine

## 2022-07-17 ENCOUNTER — Other Ambulatory Visit: Payer: Self-pay | Admitting: Internal Medicine

## 2022-07-18 ENCOUNTER — Other Ambulatory Visit: Payer: Self-pay | Admitting: Internal Medicine

## 2022-08-11 ENCOUNTER — Encounter: Payer: Self-pay | Admitting: Pharmacist

## 2022-08-18 ENCOUNTER — Encounter: Payer: Self-pay | Admitting: Internal Medicine

## 2022-08-18 ENCOUNTER — Ambulatory Visit (INDEPENDENT_AMBULATORY_CARE_PROVIDER_SITE_OTHER): Payer: Medicare Other | Admitting: Internal Medicine

## 2022-08-18 VITALS — BP 136/80 | HR 72 | Temp 98.1°F | Ht 75.0 in | Wt 249.6 lb

## 2022-08-18 DIAGNOSIS — R918 Other nonspecific abnormal finding of lung field: Secondary | ICD-10-CM

## 2022-08-18 DIAGNOSIS — Z794 Long term (current) use of insulin: Secondary | ICD-10-CM

## 2022-08-18 DIAGNOSIS — Z23 Encounter for immunization: Secondary | ICD-10-CM

## 2022-08-18 DIAGNOSIS — N182 Chronic kidney disease, stage 2 (mild): Secondary | ICD-10-CM

## 2022-08-18 DIAGNOSIS — D472 Monoclonal gammopathy: Secondary | ICD-10-CM

## 2022-08-18 DIAGNOSIS — Z0000 Encounter for general adult medical examination without abnormal findings: Secondary | ICD-10-CM | POA: Diagnosis not present

## 2022-08-18 DIAGNOSIS — Z6831 Body mass index (BMI) 31.0-31.9, adult: Secondary | ICD-10-CM

## 2022-08-18 DIAGNOSIS — E1122 Type 2 diabetes mellitus with diabetic chronic kidney disease: Secondary | ICD-10-CM | POA: Diagnosis not present

## 2022-08-18 DIAGNOSIS — I129 Hypertensive chronic kidney disease with stage 1 through stage 4 chronic kidney disease, or unspecified chronic kidney disease: Secondary | ICD-10-CM | POA: Diagnosis not present

## 2022-08-18 DIAGNOSIS — R351 Nocturia: Secondary | ICD-10-CM

## 2022-08-18 DIAGNOSIS — D513 Other dietary vitamin B12 deficiency anemia: Secondary | ICD-10-CM

## 2022-08-18 DIAGNOSIS — E6609 Other obesity due to excess calories: Secondary | ICD-10-CM

## 2022-08-18 LAB — POCT URINALYSIS DIPSTICK
Bilirubin, UA: NEGATIVE
Blood, UA: NEGATIVE
Glucose, UA: NEGATIVE
Ketones, UA: NEGATIVE
Leukocytes, UA: NEGATIVE
Nitrite, UA: NEGATIVE
Protein, UA: NEGATIVE
Spec Grav, UA: >=1.03 — AB (ref 1.010–1.025)
Urobilinogen, UA: 0.2 E.U./dL
pH, UA: 5.5 (ref 5.0–8.0)

## 2022-08-18 NOTE — Progress Notes (Signed)
I,William Mcguire,acting as a scribe for William Greenland, MD.,have documented all relevant documentation on the behalf of William Greenland, MD,as directed by  William Greenland, MD while in the presence of William Greenland, MD.   Subjective:     Patient ID: William Mcguire , male    DOB: 04/01/1956 , 67 y.o.   MRN: [id number]   Chief Complaint  Patient presents with   Annual Exam   Diabetes   Hypertension    HPI  Pt presents today for annual exam. He reports compliance with medications. Denies headache, chest pain, SOB. Pt states he does not have any specific questions or concerns.  Diabetes He presents for his follow-up diabetic visit. He has type 2 diabetes mellitus. His disease course has been stable. There are no hypoglycemic associated symptoms. Pertinent negatives for diabetes include no blurred vision, no chest pain, no polydipsia, no polyphagia and no polyuria. There are no hypoglycemic complications. Diabetic complications include nephropathy. Risk factors for coronary artery disease include diabetes mellitus, dyslipidemia, hypertension, male sex and sedentary lifestyle. His breakfast blood glucose is taken between 8-9 am. His breakfast blood glucose range is generally 90-110 mg/dl.  Hypertension This is a chronic problem. The current episode started more than 1 year ago. The problem has been gradually improving since onset. The problem is controlled. Pertinent negatives include no blurred vision, chest pain, palpitations or shortness of breath.     Past Medical History:  Diagnosis Date   Diabetes mellitus    High cholesterol    Hypertension      Family History  Problem Relation Age of Onset   Hypertension Mother    Multiple myeloma Mother    Hypertension Father    Diabetes Father      Current Outpatient Medications:    amLODipine (NORVASC) 10 MG tablet, Take 1 tablet (10 mg total) by mouth daily., Disp: 90 tablet, Rfl: 1   aspirin EC 81 MG tablet, Take 81 mg by  mouth daily., Disp: , Rfl:    atorvastatin (LIPITOR) 40 MG tablet, Take 1 tablet (40 mg total) by mouth daily., Disp: 90 tablet, Rfl: 1   B-D ULTRAFINE III SHORT PEN 31G X 8 MM MISC, USE AS DIRECTED, Disp: 100 each, Rfl: PRN   cyanocobalamin (,VITAMIN B-12,) 1000 MCG/ML injection, Inject 1 mL (1,000 mcg total) into the muscle every 30 (thirty) days., Disp: 9 mL, Rfl: 1   Dulaglutide 1.5 MG/0.5ML SOPN, Inject 1.5 mg into the skin once a week., Disp: 6 mL, Rfl: 2   enalapril (VASOTEC) 10 MG tablet, TAKE 1 TABLET BY MOUTH EVERY DAY, Disp: 90 tablet, Rfl: 2   glucose blood (ONETOUCH VERIO) test strip, Use as instructed to check blood sugars twice daily E11.69, Disp: 100 each, Rfl: 2   glucose blood test strip, Use as instructed to test blood sugar 3 times a day. Dx code: e11.65, Disp: 100 each, Rfl: 2   Insulin Glargine ([name] KWIKPEN) 100 UNIT/ML, Inject 0.22 mLs (22 Units total) into the skin at bedtime., Disp: 45 mL, Rfl: 3   metFORMIN (GLUCOPHAGE) 1000 MG tablet, TAKE 1 TABLET BY MOUTH TWICE A DAY WITH FOOD, Disp: 180 tablet, Rfl: 0   sildenafil (VIAGRA) 100 MG tablet, TAKE 1 TABLET BY MOUTH EVERY DAY AS NEEDED, Disp: 10 tablet, Rfl: 5   SYRINGE-NEEDLE, DISP, 3 ML (B-D INTEGRA SYRINGE) 25G X 5/8" 3 ML MISC, USE AS DIRECTED, Disp: 12 each, Rfl: 24  Current Facility-Administered Medications:    cyanocobalamin ((VITAMIN  B-12)) injection 1,000 mcg, 1,000 mcg, Intramuscular, Once, [name], MD   No Known Allergies   Men's preventive visit. Patient Health Questionnaire (PHQ-2) is  [place] Office Visit from 08/18/2022 in [facility]  PHQ-2 Total Score 0     . Patient is on a diabetic diet. Marital status: Married. Relevant history for alcohol use is:  Social History   Substance and Sexual Activity  Alcohol Use No  . Relevant history for tobacco use is:  Social History   Tobacco Use  Smoking Status Never  Smokeless Tobacco Never  Tobacco  Comments   n/a  .   Review of Systems  Constitutional: Negative.   HENT: Negative.    Eyes: Negative.  Negative for blurred vision.  Respiratory: Negative.  Negative for shortness of breath.   Cardiovascular: Negative.  Negative for chest pain and palpitations.  Gastrointestinal: Negative.   Endocrine: Negative.  Negative for polydipsia, polyphagia and polyuria.  Genitourinary: Negative.   Musculoskeletal: Negative.   Skin: Negative.   Allergic/Immunologic: Negative.   Neurological: Negative.   Hematological: Negative.      Today's Vitals   08/18/22 1415 08/18/22 1437  BP: (!) 150/80 136/80  Pulse: 72   Temp: 98.1 F (36.7 C)   SpO2: 98%   Weight: 249 lb 9.6 oz (113.2 kg)   Height: '6\' 3"'$  (1.905 m)    Body mass index is 31.2 kg/m.  Wt Readings from Last 3 Encounters:  08/28/22 252 lb (114.3 kg)  08/18/22 249 lb 9.6 oz (113.2 kg)  06/10/22 256 lb 9.6 oz (116.4 kg)    Objective:  Physical Exam Vitals and nursing note reviewed.  Constitutional:      Appearance: Normal appearance.  HENT:     Head: Normocephalic and atraumatic.     Right Ear: Tympanic membrane, ear canal and external ear normal.     Left Ear: Tympanic membrane, ear canal and external ear normal.     Nose:     Comments: Masked     Mouth/Throat:     Comments: Masked  Eyes:     Extraocular Movements: Extraocular movements intact.     Conjunctiva/sclera: Conjunctivae normal.     Pupils: Pupils are equal, round, and reactive to light.  Cardiovascular:     Rate and Rhythm: Normal rate and regular rhythm.     Pulses: Normal pulses.          Dorsalis pedis pulses are 2+ on the right side and 2+ on the left side.     Heart sounds: Normal heart sounds.  Pulmonary:     Effort: Pulmonary effort is normal.     Breath sounds: Normal breath sounds.  Chest:     Chest wall: No tenderness.  Breasts:    Right: Normal. No swelling, bleeding, inverted nipple, mass or nipple discharge.     Left: Normal. No  swelling, bleeding, inverted nipple, mass or nipple discharge.  Abdominal:     General: Bowel sounds are normal.     Palpations: Abdomen is soft.  Genitourinary:    Comments: Deferred  Musculoskeletal:        General: Normal range of motion.     Cervical back: Normal range of motion and neck supple.  Feet:     Right foot:     Protective Sensation: 5 sites tested.  5 sites sensed.     Skin integrity: Callus and dry skin present.     Toenail Condition: Right toenails are normal.  Left foot:     Protective Sensation: 5 sites tested.  5 sites sensed.     Skin integrity: Callus and dry skin present.     Toenail Condition: Left toenails are normal.  Skin:    General: Skin is warm.  Neurological:     General: No focal deficit present.     Mental Status: He is alert.  Psychiatric:        Mood and Affect: Mood normal.        Behavior: Behavior normal.     Assessment And Plan:    1. Encounter for annual physical exam Comments: A full exam was performed. DRE deferred, per patient request. PATIENT IS ADVISED TO GET 30-45 MINUTES REGULAR EXERCISE NO LESS THAN FOUR TO FIVE DAYS PER WEEK - BOTH WEIGHTBEARING EXERCISES AND AEROBIC ARE RECOMMENDED.  PATIENT IS ADVISED TO FOLLOW A HEALTHY DIET WITH AT LEAST SIX FRUITS/VEGGIES PER DAY, DECREASE INTAKE OF RED MEAT, AND TO INCREASE FISH INTAKE TO TWO DAYS PER WEEK.  MEATS/FISH SHOULD NOT BE FRIED, BAKED OR BROILED IS PREFERABLE.  IT IS ALSO IMPORTANT TO CUT BACK ON YOUR SUGAR INTAKE. PLEASE AVOID ANYTHING WITH ADDED SUGAR, CORN SYRUP OR OTHER SWEETENERS. IF YOU MUST USE A SWEETENER, YOU CAN TRY STEVIA. IT IS ALSO IMPORTANT TO AVOID ARTIFICIALLY SWEETENERS AND DIET BEVERAGES. LASTLY, I SUGGEST WEARING SPF 50 SUNSCREEN ON EXPOSED PARTS AND ESPECIALLY WHEN IN THE DIRECT SUNLIGHT FOR AN EXTENDED PERIOD OF TIME.  PLEASE AVOID FAST FOOD RESTAURANTS AND INCREASE YOUR WATER INTAKE.  2. Type 2 diabetes mellitus with stage 2 chronic kidney disease, with  long-term current use of insulin (HCC) Comments: Chronic, diabetic foot exam was performed.  He will c/w Trulicity and metformin.  He will f/u in 4 months for re-evaluation. I DISCUSSED WITH THE PATIENT AT LENGTH REGARDING THE GOALS OF GLYCEMIC CONTROL AND POSSIBLE LONG-TERM COMPLICATIONS.  I  ALSO STRESSED THE IMPORTANCE OF COMPLIANCE WITH HOME GLUCOSE MONITORING, DIETARY RESTRICTIONS INCLUDING AVOIDANCE OF SUGARY DRINKS/PROCESSED FOODS,  ALONG WITH REGULAR EXERCISE.  I  ALSO STRESSED THE IMPORTANCE OF ANNUAL EYE EXAMS, SELF FOOT CARE AND COMPLIANCE WITH OFFICE VISITS.  - POCT Urinalysis Dipstick (81002) - Microalbumin / creatinine urine ratio - EKG 12-Lead - CMP14+EGFR - Lipid panel - Hemoglobin A1c  3. Hypertensive nephropathy Comments: Chronic, fair control. EKG performed, NSR w/o acute changes.  No med changes today. Goal BP<130/80. It is important to aim for at least 150 minutes of exercise/week and to follow low sodium diet. - POCT Urinalysis Dipstick (81002) - Microalbumin / creatinine urine ratio - EKG 12-Lead - CMP14+EGFR - Lipid panel - Hemoglobin A1c - TSH  4. Smoldering myeloma Comments: Recent Oncology notes reviewed. He is s/p bone marrow biopsy. He is currently under observation, No end organ damage noted thus far.  5. Nocturia Comments: DRE declined, he is not followed by Urology. I will check a PSA today. - PSA  6. Other dietary vitamin B12 deficiency anemia Comments: His wife administers his monthly vitamin B12 injections. I will check B12 level today. - Vitamin B12  7. Abnormal CT scan of lung Comments: Last study: June 2022.  At that time, lung nodule/opacity previously identified is stable. Nodular lesions in RUL/LLL, no change. Repeat study due June 2024.  8. Class 1 obesity due to excess calories with serious comorbidity and body mass index (BMI) of 31.0 to 31.9 in adult Comments: He is encouraged to aim for at least 150 minutes of exercise/week, while  striving for BMI<30  to decrease cardiac risk.  9. Flu vaccine need - Flu Vaccine QUAD High Dose(Fluad)  10. Need for Tdap vaccination - Tdap vaccine greater than or equal to 7yo IM  Patient was given opportunity to ask questions. Patient verbalized understanding of the plan and was able to repeat key elements of the plan. All questions were answered to their satisfaction.   I, William Greenland, MD, have reviewed all documentation for this visit. The documentation on 08/31/22 for the exam, diagnosis, procedures, and orders are all accurate and complete.   THE PATIENT IS ENCOURAGED TO PRACTICE SOCIAL DISTANCING DUE TO THE COVID-19 PANDEMIC.

## 2022-08-18 NOTE — Patient Instructions (Signed)
Health Maintenance, Male Adopting a healthy lifestyle and getting preventive care are important in promoting health and wellness. Ask your health care provider about: The right schedule for you to have regular tests and exams. Things you can do on your own to prevent diseases and keep yourself healthy. What should I know about diet, weight, and exercise? Eat a healthy diet  Eat a diet that includes plenty of vegetables, fruits, low-fat dairy products, and lean protein. Do not eat a lot of foods that are high in solid fats, added sugars, or sodium. Maintain a healthy weight Body mass index (BMI) is a measurement that can be used to identify possible weight problems. It estimates body fat based on height and weight. Your health care provider can help determine your BMI and help you achieve or maintain a healthy weight. Get regular exercise Get regular exercise. This is one of the most important things you can do for your health. Most adults should: Exercise for at least 150 minutes each week. The exercise should increase your heart rate and make you sweat (moderate-intensity exercise). Do strengthening exercises at least twice a week. This is in addition to the moderate-intensity exercise. Spend less time sitting. Even light physical activity can be beneficial. Watch cholesterol and blood lipids Have your blood tested for lipids and cholesterol at 69 years of age, then have this test every 5 years. You may need to have your cholesterol levels checked more often if: Your lipid or cholesterol levels are high. You are older than 69 years of age. You are at high risk for heart disease. What should I know about cancer screening? Many types of cancers can be detected early and may often be prevented. Depending on your health history and family history, you may need to have cancer screening at various ages. This may include screening for: Colorectal cancer. Prostate cancer. Skin cancer. Lung  cancer. What should I know about heart disease, diabetes, and high blood pressure? Blood pressure and heart disease High blood pressure causes heart disease and increases the risk of stroke. This is more likely to develop in people who have high blood pressure readings or are overweight. Talk with your health care provider about your target blood pressure readings. Have your blood pressure checked: Every 3-5 years if you are 18-39 years of age. Every year if you are 40 years old or older. If you are between the ages of 65 and 75 and are a current or former smoker, ask your health care provider if you should have a one-time screening for abdominal aortic aneurysm (AAA). Diabetes Have regular diabetes screenings. This checks your fasting blood sugar level. Have the screening done: Once every three years after age 45 if you are at a normal weight and have a low risk for diabetes. More often and at a younger age if you are overweight or have a high risk for diabetes. What should I know about preventing infection? Hepatitis B If you have a higher risk for hepatitis B, you should be screened for this virus. Talk with your health care provider to find out if you are at risk for hepatitis B infection. Hepatitis C Blood testing is recommended for: Everyone born from 1945 through 1965. Anyone with known risk factors for hepatitis C. Sexually transmitted infections (STIs) You should be screened each year for STIs, including gonorrhea and chlamydia, if: You are sexually active and are younger than 69 years of age. You are older than 69 years of age and your   health care provider tells you that you are at risk for this type of infection. Your sexual activity has changed since you were last screened, and you are at increased risk for chlamydia or gonorrhea. Ask your health care provider if you are at risk. Ask your health care provider about whether you are at high risk for HIV. Your health care provider  may recommend a prescription medicine to help prevent HIV infection. If you choose to take medicine to prevent HIV, you should first get tested for HIV. You should then be tested every 3 months for as long as you are taking the medicine. Follow these instructions at home: Alcohol use Do not drink alcohol if your health care provider tells you not to drink. If you drink alcohol: Limit how much you have to 0-2 drinks a day. Know how much alcohol is in your drink. In the [place] one drink equals one 12 oz bottle of beer (355 mL), one 5 oz glass of wine (148 mL), or one 1 oz glass of hard liquor (44 mL). Lifestyle Do not use any products that contain nicotine or tobacco. These products include cigarettes, chewing tobacco, and vaping devices, such as e-cigarettes. If you need help quitting, ask your health care provider. Do not use street drugs. Do not share needles. Ask your health care provider for help if you need support or information about quitting drugs. General instructions Schedule regular health, dental, and eye exams. Stay current with your vaccines. Tell your health care provider if: You often feel depressed. You have ever been abused or do not feel safe at home. Summary Adopting a healthy lifestyle and getting preventive care are important in promoting health and wellness. Follow your health care provider's instructions about healthy diet, exercising, and getting tested or screened for diseases. Follow your health care provider's instructions on monitoring your cholesterol and blood pressure. This information is not intended to replace advice given to you by your health care provider. Make sure you discuss any questions you have with your health care provider. Document Revised: 10/29/2020 Document Reviewed: 10/29/2020 Elsevier Patient Education  2023 [employer]  

## 2022-08-19 ENCOUNTER — Inpatient Hospital Stay: Payer: Medicare Other | Attending: Hematology and Oncology

## 2022-08-19 ENCOUNTER — Other Ambulatory Visit: Payer: Self-pay

## 2022-08-19 DIAGNOSIS — N182 Chronic kidney disease, stage 2 (mild): Secondary | ICD-10-CM | POA: Diagnosis not present

## 2022-08-19 DIAGNOSIS — R918 Other nonspecific abnormal finding of lung field: Secondary | ICD-10-CM

## 2022-08-19 DIAGNOSIS — D539 Nutritional anemia, unspecified: Secondary | ICD-10-CM

## 2022-08-19 DIAGNOSIS — D472 Monoclonal gammopathy: Secondary | ICD-10-CM

## 2022-08-19 DIAGNOSIS — D649 Anemia, unspecified: Secondary | ICD-10-CM | POA: Diagnosis not present

## 2022-08-19 LAB — PSA: Prostate Specific Ag, Serum: 0.5 ng/mL (ref 0.0–4.0)

## 2022-08-19 LAB — LIPID PANEL
Chol/HDL Ratio: 2.8 ratio (ref 0.0–5.0)
Cholesterol, Total: 139 mg/dL (ref 100–199)
HDL: 50 mg/dL (ref >39)
LDL Chol Calc (NIH): 74 mg/dL (ref 0–99)
Triglycerides: 77 mg/dL (ref 0–149)
VLDL Cholesterol Cal: 15 mg/dL (ref 5–40)

## 2022-08-19 LAB — CMP14+EGFR
ALT: 17 IU/L (ref 0–44)
AST: 25 IU/L (ref 0–40)
Albumin/Globulin Ratio: 1.3 (ref 1.2–2.2)
Albumin: 4.3 g/dL (ref 3.9–4.9)
Alkaline Phosphatase: 104 IU/L (ref 44–121)
BUN/Creatinine Ratio: 11 (ref 10–24)
BUN: 13 mg/dL (ref 8–27)
Bilirubin Total: 0.3 mg/dL (ref 0.0–1.2)
CO2: 22 mmol/L (ref 20–29)
Calcium: 9.6 mg/dL (ref 8.6–10.2)
Chloride: 104 mmol/L (ref 96–106)
Creatinine, Ser: 1.18 mg/dL (ref 0.76–1.27)
Globulin, Total: 3.4 g/dL (ref 1.5–4.5)
Glucose: 90 mg/dL (ref 70–99)
Potassium: 4.1 mmol/L (ref 3.5–5.2)
Sodium: 140 mmol/L (ref 134–144)
Total Protein: 7.7 g/dL (ref 6.0–8.5)
eGFR: 67 mL/min/{1.73_m2} (ref >59)

## 2022-08-19 LAB — CBC WITH DIFFERENTIAL (CANCER CENTER ONLY)
Abs Immature Granulocytes: 0.03 10*3/uL (ref 0.00–0.07)
Basophils Absolute: 0 10*3/uL (ref 0.0–0.1)
Basophils Relative: 0 %
Eosinophils Absolute: 0.1 10*3/uL (ref 0.0–0.5)
Eosinophils Relative: 2 %
HCT: 37 % — ABNORMAL LOW (ref 39.0–52.0)
Hemoglobin: 12.4 g/dL — ABNORMAL LOW (ref 13.0–17.0)
Immature Granulocytes: 1 %
Lymphocytes Relative: 21 %
Lymphs Abs: 1.2 10*3/uL (ref 0.7–4.0)
MCH: 29 pg (ref 26.0–34.0)
MCHC: 33.5 g/dL (ref 30.0–36.0)
MCV: 86.7 fL (ref 80.0–100.0)
Monocytes Absolute: 0.6 10*3/uL (ref 0.1–1.0)
Monocytes Relative: 11 %
Neutro Abs: 3.8 10*3/uL (ref 1.7–7.7)
Neutrophils Relative %: 65 %
Platelet Count: 190 10*3/uL (ref 150–400)
RBC: 4.27 MIL/uL (ref 4.22–5.81)
RDW: 13 % (ref 11.5–15.5)
WBC Count: 5.8 10*3/uL (ref 4.0–10.5)
nRBC: 0 % (ref 0.0–0.2)

## 2022-08-19 LAB — CMP (CANCER CENTER ONLY)
ALT: 16 U/L (ref 0–44)
AST: 22 U/L (ref 15–41)
Albumin: 4.3 g/dL (ref 3.5–5.0)
Alkaline Phosphatase: 96 U/L (ref 38–126)
Anion gap: 5 (ref 5–15)
BUN: 10 mg/dL (ref 8–23)
CO2: 29 mmol/L (ref 22–32)
Calcium: 9.1 mg/dL (ref 8.9–10.3)
Chloride: 104 mmol/L (ref 98–111)
Creatinine: 1.14 mg/dL (ref 0.61–1.24)
GFR, Estimated: >60 mL/min (ref >60)
Glucose, Bld: 77 mg/dL (ref 70–99)
Potassium: 4.1 mmol/L (ref 3.5–5.1)
Sodium: 138 mmol/L (ref 135–145)
Total Bilirubin: 0.5 mg/dL (ref 0.3–1.2)
Total Protein: 8.4 g/dL — ABNORMAL HIGH (ref 6.5–8.1)

## 2022-08-19 LAB — HEMOGLOBIN A1C
Est. average glucose Bld gHb Est-mCnc: 160 mg/dL
Hgb A1c MFr Bld: 7.2 % — ABNORMAL HIGH (ref 4.8–5.6)

## 2022-08-19 LAB — MICROALBUMIN / CREATININE URINE RATIO
Creatinine, Urine: 190.2 mg/dL
Microalb/Creat Ratio: 26 mg/g creat (ref 0–29)
Microalbumin, Urine: 49.3 ug/mL

## 2022-08-19 LAB — TSH: TSH: 1.65 u[IU]/mL (ref 0.450–4.500)

## 2022-08-19 LAB — VITAMIN B12: Vitamin B-12: 444 pg/mL (ref 232–1245)

## 2022-08-20 LAB — KAPPA/LAMBDA LIGHT CHAINS
Kappa free light chain: 137.9 mg/L — ABNORMAL HIGH (ref 3.3–19.4)
Kappa, lambda light chain ratio: 11.99 — ABNORMAL HIGH (ref 0.26–1.65)
Lambda free light chains: 11.5 mg/L (ref 5.7–26.3)

## 2022-08-25 LAB — MULTIPLE MYELOMA PANEL, SERUM
Albumin SerPl Elph-Mcnc: 3.9 g/dL (ref 2.9–4.4)
Albumin/Glob SerPl: 1 (ref 0.7–1.7)
Alpha 1: 0.2 g/dL (ref 0.0–0.4)
Alpha2 Glob SerPl Elph-Mcnc: 0.8 g/dL (ref 0.4–1.0)
B-Globulin SerPl Elph-Mcnc: 1 g/dL (ref 0.7–1.3)
Gamma Glob SerPl Elph-Mcnc: 2.2 g/dL — ABNORMAL HIGH (ref 0.4–1.8)
Globulin, Total: 4.3 g/dL — ABNORMAL HIGH (ref 2.2–3.9)
IgA: 122 mg/dL (ref 61–437)
IgG (Immunoglobin G), Serum: 2461 mg/dL — ABNORMAL HIGH (ref 603–1613)
IgM (Immunoglobulin M), Srm: 45 mg/dL (ref 20–172)
M Protein SerPl Elph-Mcnc: 1.8 g/dL — ABNORMAL HIGH
Total Protein ELP: 8.2 g/dL (ref 6.0–8.5)

## 2022-08-28 ENCOUNTER — Inpatient Hospital Stay: Payer: Medicare Other | Attending: Hematology and Oncology | Admitting: Hematology and Oncology

## 2022-08-28 ENCOUNTER — Other Ambulatory Visit: Payer: Self-pay

## 2022-08-28 ENCOUNTER — Encounter: Payer: Self-pay | Admitting: Hematology and Oncology

## 2022-08-28 ENCOUNTER — Ambulatory Visit: Payer: Medicare Other | Admitting: Hematology and Oncology

## 2022-08-28 VITALS — BP 150/78 | HR 75 | Temp 97.6°F | Resp 16 | Wt 252.0 lb

## 2022-08-28 DIAGNOSIS — R918 Other nonspecific abnormal finding of lung field: Secondary | ICD-10-CM | POA: Diagnosis not present

## 2022-08-28 DIAGNOSIS — N182 Chronic kidney disease, stage 2 (mild): Secondary | ICD-10-CM | POA: Diagnosis not present

## 2022-08-28 DIAGNOSIS — D638 Anemia in other chronic diseases classified elsewhere: Secondary | ICD-10-CM | POA: Diagnosis not present

## 2022-08-28 DIAGNOSIS — E1122 Type 2 diabetes mellitus with diabetic chronic kidney disease: Secondary | ICD-10-CM | POA: Insufficient documentation

## 2022-08-28 DIAGNOSIS — D472 Monoclonal gammopathy: Secondary | ICD-10-CM | POA: Diagnosis not present

## 2022-08-28 DIAGNOSIS — I129 Hypertensive chronic kidney disease with stage 1 through stage 4 chronic kidney disease, or unspecified chronic kidney disease: Secondary | ICD-10-CM | POA: Insufficient documentation

## 2022-08-28 NOTE — Assessment & Plan Note (Signed)
This is multifactorial He does have history of diabetes and hypertension We discussed importance of hydration and risk factor modification and lifestyle changes

## 2022-08-28 NOTE — Assessment & Plan Note (Signed)
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

## 2022-08-28 NOTE — Assessment & Plan Note (Signed)
I recommend close follow-up with repeat imaging study in 3 months He is not symptomatic

## 2022-08-28 NOTE — Progress Notes (Signed)
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 I plan to see him every 3 months for the first 2 years for assessment The patient is warned of signs and symptoms of disease progression We discussed importance of lifestyle changes and risk factor modification  Abnormal CT scan of lung I recommend close follow-up with repeat imaging study in 3 months He is not symptomatic  Chronic renal disease, stage II This is multifactorial He does have history of diabetes and hypertension We discussed importance of hydration and risk factor modification and lifestyle changes  Orders Placed This Encounter  Procedures   CT CHEST WO CONTRAST    Standing Status:   Future    Standing Expiration Date:   08/28/2023    Order Specific Question:   Preferred imaging location?    Answer:   [facility]    Order Specific Question:   Radiology Contrast Protocol - do NOT remove file path    Answer:   [email or web address]   CBC with Differential/Platelet    Standing Status:   Standing    Number of Occurrences:   22    Standing Expiration Date:   08/28/2023   Comprehensive metabolic panel    Standing Status:   Standing    Number of Occurrences:   33    Standing Expiration Date:   08/28/2023    All questions were answered. The patient knows to call the clinic with any problems, questions or concerns. The total time spent in the appointment was 20 minutes encounter with patients  including review of chart and various tests results, discussions about plan of care and coordination of care plan   [name], MD 08/28/2022 10:28 AM  INTERVAL HISTORY: Please see below for problem oriented charting. he returns for review of test results We discussed test results He is doing better He is drinking lots of water He managed to lose 5 pounds since her last visit No new bone pain no recent infection  REVIEW OF SYSTEMS:   Constitutional: Denies fevers, chills or abnormal weight loss Eyes: Denies blurriness of vision Ears, nose, mouth, throat, and face: Denies mucositis or sore throat Respiratory: Denies cough, dyspnea or wheezes Cardiovascular: Denies palpitation, chest discomfort or lower extremity swelling Gastrointestinal:  Denies nausea, heartburn or change in bowel habits Skin: Denies abnormal skin rashes Lymphatics: Denies new lymphadenopathy or easy bruising Neurological:Denies numbness, tingling or new weaknesses Behavioral/Psych: Mood is stable, no new changes  All other systems were reviewed with the patient and are negative.  I have reviewed the past medical history, past surgical history, social history and family history with the patient and they are unchanged from previous note.  ALLERGIES:  has No Known Allergies.  MEDICATIONS:  Current Outpatient Medications  Medication Sig Dispense Refill   amLODipine (NORVASC) 10 MG tablet Take 1 tablet (10 mg total) by mouth daily. 90 tablet 1   aspirin EC 81 MG tablet Take 81 mg by mouth daily.     atorvastatin (LIPITOR) 40 MG tablet Take 1 tablet (40 mg  total) by mouth daily. 90 tablet 1   B-D ULTRAFINE III SHORT PEN 31G X 8 MM MISC USE AS DIRECTED 100 each PRN   cyanocobalamin (,VITAMIN B-12,) 1000 MCG/ML injection Inject 1 mL (1,000 mcg total) into the muscle every 30 (thirty) days. 9 mL 1   Dulaglutide 1.5 MG/0.5ML SOPN Inject 1.5 mg into the skin once a week. 6 mL 2   enalapril (VASOTEC) 10 MG tablet TAKE 1  TABLET BY MOUTH EVERY DAY 90 tablet 2   glucose blood (ONETOUCH VERIO) test strip Use as instructed to check blood sugars twice daily E11.69 100 each 2   glucose blood test strip Use as instructed to test blood sugar 3 times a day. Dx code: e11.65 100 each 2   Insulin Glargine ([name] KWIKPEN) 100 UNIT/ML Inject 0.22 mLs (22 Units total) into the skin at bedtime. 45 mL 3   metFORMIN (GLUCOPHAGE) 1000 MG tablet TAKE 1 TABLET BY MOUTH TWICE A DAY WITH FOOD 180 tablet 0   sildenafil (VIAGRA) 100 MG tablet TAKE 1 TABLET BY MOUTH EVERY DAY AS NEEDED 10 tablet 5   SYRINGE-NEEDLE, DISP, 3 ML (B-D INTEGRA SYRINGE) 25G X 5/8" 3 ML MISC USE AS DIRECTED 12 each 24   Current Facility-Administered Medications  Medication Dose Route Frequency Provider Last Rate Last Admin   cyanocobalamin ((VITAMIN B-12)) injection 1,000 mcg  1,000 mcg Intramuscular Once [name], MD        SUMMARY OF ONCOLOGIC HISTORY: Oncology History Overview Note  Normal cytogenetics   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 No lytic or destructive lesions within the bones. 2. 2.9 cm nodular opacity in the hilar region on the left, unchanged from recent CT. 3. Scattered degenerative changes.   06/02/2022 Bone Marrow Biopsy   BONE MARROW, ASPIRATE, CLOT, CORE: -Variably cellular bone marrow involved by plasma cell neoplasm (10% plasma cells by manual aspirate differential, approximately 10 to 15% by CD138 immunohistochemical stains on clot and core sections, and kappa restricted by kappa/lambda in situ hybridization.).  See comment.  PERIPHERAL BLOOD: -Normocytic normochromic anemia  COMMENT:   Morphological evaluation of the bone marrow clot sections reveals multiple lymphoid aggregates in addition to increased plasma cells.  CD3 and CD20 stains are attempted, however the lymphoid aggregates are lost on deeper levels with a single lymphoid aggregate showing slightly more CD20 staining than CD3.  While flow cytometry  is negative for a clonal B-cell population, close clinical and imaging follow-up is recommended along with assessment of peripheral blood and potential sites of lymphadenopathy/organomegaly as clinically indicated.   MICROSCOPIC DESCRIPTION:  PERIPHERAL BLOOD SMEAR: Platelets: Adequate, no platelet clumps identified Erythroid: Normocytic normochromic anemia Leukocytes: Adequate, negative for dysplastic granulocytes, blasts or plasma cells.  Plasmacytoid lymphocytes present.  BONE MARROW ASPIRATE: Cellular Erythroid precursors: Erythroid precursors show a full sequence of generally orderly maturation Granulocytic precursors: Granulocytic precursors show full sequence of generally orderly maturation Megakaryocytes: Typical in number and morphology Lymphocytes/plasma cells: Plasma cells are increased  TOUCH PREPARATIONS: Confirmatory of the aspirate findings  CLOT AND BIOPSY: The bone marrow clot and biopsy reveal a variably cellular bone marrow (20 to 60%) with trilineage hematopoiesis.  Plasma cells are increased and present singly scattered and in clusters.  Also present are loosely circumscribed lymphoid aggregates with small mature lymphocytes.  The findings are confirmatory of the aspirate and touch prep impression.  SPECIAL stains: CD138: CD138 highlights plasma cells, approximately 10% of cellularity in clot and core sections Kappa/lambda ISH: Kappa/lambda ISH shows  kappa restricted plasma cells CD3: CD3 highlights T lymphocytes in the lymphoid aggregates CD20: CD20 highlights B lymphocytes in lymphoid aggregates (slightly more than CD3) CD56: CD56 is positive in the plasma cells MUM 1: MUM1 is positive in the plasma cells IRON STAIN: Iron stains are performed on a bone marrow aspirate or touch imprint smear and section of clot. The controls stained appropriately.       Storage Iron: Scant      Ring Sideroblasts: Not identified  ADDITIONAL DATA/TESTING: Multiple myeloma  panel  CELL COUNT DATA:  Bone Marrow count performed on 500 cells shows: Blasts:   0%   Myeloid:  43% Promyelocytes: 0%   Erythroid:     37% Myelocytes:    5%   Lymphocytes:   7% Metamyelocytes:     2%   Plasma cells:  10% Bands:    4% Neutrophils:   30%  M:E ratio:     1.16 Eosinophils:   2% Basophils:     0% Monocytes:     3%      PHYSICAL EXAMINATION: ECOG PERFORMANCE STATUS: 0 - Asymptomatic  Vitals:   08/28/22 1006  BP: (!) 150/78  Pulse: 75  Resp: 16  Temp: 97.6 F (36.4 C)  SpO2: 100%   Filed Weights   08/28/22 1006  Weight: 252 lb (114.3 kg)    GENERAL:alert, no distress and comfortable NEURO: alert & oriented x 3 with fluent speech, no focal motor/sensory deficits  LABORATORY DATA:  I have reviewed the data as listed    Component Value Date/Time   NA 138 08/19/2022 1314   NA 140 08/18/2022 1518   K 4.1 08/19/2022 1314   CL 104 08/19/2022 1314   CO2 29 08/19/2022 1314   GLUCOSE 77 08/19/2022 1314   BUN 10 08/19/2022 1314   BUN 13 08/18/2022 1518   CREATININE 1.14 08/19/2022 1314   CALCIUM 9.1 08/19/2022 1314   PROT 8.4 (H) 08/19/2022 1314   PROT 7.7 08/18/2022 1518   ALBUMIN 4.3 08/19/2022 1314   ALBUMIN 4.3 08/18/2022 1518   AST 22 08/19/2022 1314   ALT 16 08/19/2022 1314   ALKPHOS 96 08/19/2022 1314   BILITOT 0.5 08/19/2022 1314   GFRNONAA >60 08/19/2022 1314   GFRAA 70 08/06/2020 1044    No results found for: "SPEP", "UPEP"  Lab Results  Component Value Date   WBC 5.8 08/19/2022   NEUTROABS 3.8 08/19/2022   HGB 12.4 (L) 08/19/2022   HCT 37.0 (L) 08/19/2022   MCV 86.7 08/19/2022   PLT 190 08/19/2022      Chemistry      Component Value Date/Time   NA 138 08/19/2022 1314   NA 140 08/18/2022 1518   K 4.1 08/19/2022 1314   CL 104 08/19/2022 1314   CO2 29 08/19/2022 1314   BUN 10 08/19/2022 1314   BUN 13 08/18/2022 1518   CREATININE 1.14 08/19/2022 1314      Component Value Date/Time   CALCIUM 9.1 08/19/2022 1314   ALKPHOS  96 08/19/2022 1314   AST 22 08/19/2022 1314   ALT 16 08/19/2022 1314   BILITOT 0.5 08/19/2022 1314

## 2022-08-31 ENCOUNTER — Encounter: Payer: Self-pay | Admitting: Internal Medicine

## 2022-08-31 NOTE — Progress Notes (Incomplete)
I,[name],acting as a scribe for [name], MD.,have documented all relevant documentation on the behalf of [name], MD,as directed by  [name], MD while in the presence of [name], MD.   Subjective:     Patient ID: William Mcguire , male    DOB: 1956/05/16 , 67 y.o.   MRN: [id number]   Chief Complaint  Patient presents with  . Annual Exam  . Diabetes  . Hypertension    HPI  Pt presents today for annual exam. He reports compliance with medications. Denies headache, chest pain, SOB. Pt states he does not have any specific questions or concerns.  Diabetes He presents for his follow-up diabetic visit. He has type 2 diabetes mellitus. His disease course has been stable. There are no hypoglycemic associated symptoms. Pertinent negatives for diabetes include no blurred vision, no chest pain, no polydipsia, no polyphagia and no polyuria. There are no hypoglycemic complications. Diabetic complications include nephropathy. Risk factors for coronary artery disease include diabetes mellitus, dyslipidemia, hypertension, male sex and sedentary lifestyle. His breakfast blood glucose is taken between 8-9 am. His breakfast blood glucose range is generally 90-110 mg/dl.  Hypertension This is a chronic problem. The current episode started more than 1 year ago. The problem has been gradually improving since onset. The problem is controlled. Pertinent negatives include no blurred vision, chest pain, palpitations or shortness of breath.     Past Medical History:  Diagnosis Date  . Diabetes mellitus   . High cholesterol   . Hypertension      Family History  Problem Relation Age of Onset  . Hypertension Mother   . Multiple myeloma Mother   . Hypertension Father   . Diabetes Father      Current Outpatient Medications:  .  amLODipine (NORVASC) 10 MG tablet, Take 1 tablet (10 mg total) by mouth daily., Disp: 90 tablet, Rfl: 1 .  aspirin EC 81 MG tablet, Take  81 mg by mouth daily., Disp: , Rfl:  .  atorvastatin (LIPITOR) 40 MG tablet, Take 1 tablet (40 mg total) by mouth daily., Disp: 90 tablet, Rfl: 1 .  B-D ULTRAFINE III SHORT PEN 31G X 8 MM MISC, USE AS DIRECTED, Disp: 100 each, Rfl: PRN .  cyanocobalamin (,VITAMIN B-12,) 1000 MCG/ML injection, Inject 1 mL (1,000 mcg total) into the muscle every 30 (thirty) days., Disp: 9 mL, Rfl: 1 .  Dulaglutide 1.5 MG/0.5ML SOPN, Inject 1.5 mg into the skin once a week., Disp: 6 mL, Rfl: 2 .  enalapril (VASOTEC) 10 MG tablet, TAKE 1 TABLET BY MOUTH EVERY DAY, Disp: 90 tablet, Rfl: 2 .  glucose blood (ONETOUCH VERIO) test strip, Use as instructed to check blood sugars twice daily E11.69, Disp: 100 each, Rfl: 2 .  glucose blood test strip, Use as instructed to test blood sugar 3 times a day. Dx code: e11.65, Disp: 100 each, Rfl: 2 .  Insulin Glargine ([name] KWIKPEN) 100 UNIT/ML, Inject 0.22 mLs (22 Units total) into the skin at bedtime., Disp: 45 mL, Rfl: 3 .  metFORMIN (GLUCOPHAGE) 1000 MG tablet, TAKE 1 TABLET BY MOUTH TWICE A DAY WITH FOOD, Disp: 180 tablet, Rfl: 0 .  sildenafil (VIAGRA) 100 MG tablet, TAKE 1 TABLET BY MOUTH EVERY DAY AS NEEDED, Disp: 10 tablet, Rfl: 5 .  SYRINGE-NEEDLE, DISP, 3 ML (B-D INTEGRA SYRINGE) 25G X 5/8" 3 ML MISC, USE AS DIRECTED, Disp: 12 each, Rfl: 24  Current Facility-Administered Medications:  .  cyanocobalamin ((VITAMIN  B-12)) injection 1,000 mcg, 1,000 mcg, Intramuscular, Once, [name], MD   No Known Allergies   Men's preventive visit. Patient Health Questionnaire (PHQ-2) is  [place] Office Visit from 08/18/2022 in [facility]  PHQ-2 Total Score 0     . Patient is on a *** diet. Marital status: Married. Relevant history for alcohol use is:  Social History   Substance and Sexual Activity  Alcohol Use No  . Relevant history for tobacco use is:  Social History   Tobacco Use  Smoking Status Never  Smokeless Tobacco  Never  Tobacco Comments   n/a  .   Review of Systems  Constitutional: Negative.   HENT: Negative.    Eyes: Negative.  Negative for blurred vision.  Respiratory: Negative.  Negative for shortness of breath.   Cardiovascular: Negative.  Negative for chest pain and palpitations.  Gastrointestinal: Negative.   Endocrine: Negative.  Negative for polydipsia, polyphagia and polyuria.  Genitourinary: Negative.   Musculoskeletal: Negative.   Skin: Negative.   Allergic/Immunologic: Negative.   Neurological: Negative.   Hematological: Negative.      Today's Vitals   08/18/22 1415 08/18/22 1437  BP: (!) 150/80 136/80  Pulse: 72   Temp: 98.1 F (36.7 C)   SpO2: 98%   Weight: 249 lb 9.6 oz (113.2 kg)   Height: '6\' 3"'$  (1.905 m)    Body mass index is 31.2 kg/m.  Wt Readings from Last 3 Encounters:  08/28/22 252 lb (114.3 kg)  08/18/22 249 lb 9.6 oz (113.2 kg)  06/10/22 256 lb 9.6 oz (116.4 kg)    Objective:  Physical Exam Vitals and nursing note reviewed.  Constitutional:      Appearance: Normal appearance.  HENT:     Head: Normocephalic and atraumatic.     Right Ear: Tympanic membrane, ear canal and external ear normal.     Left Ear: Tympanic membrane, ear canal and external ear normal.     Nose:     Comments: Masked     Mouth/Throat:     Comments: Masked  Eyes:     Extraocular Movements: Extraocular movements intact.     Conjunctiva/sclera: Conjunctivae normal.     Pupils: Pupils are equal, round, and reactive to light.  Cardiovascular:     Rate and Rhythm: Normal rate and regular rhythm.     Pulses: Normal pulses.          Dorsalis pedis pulses are 2+ on the right side and 2+ on the left side.     Heart sounds: Normal heart sounds.  Pulmonary:     Effort: Pulmonary effort is normal.     Breath sounds: Normal breath sounds.  Chest:     Chest wall: No tenderness.  Breasts:    Right: Normal. No swelling, bleeding, inverted nipple, mass or nipple discharge.     Left:  Normal. No swelling, bleeding, inverted nipple, mass or nipple discharge.  Abdominal:     General: Bowel sounds are normal.     Palpations: Abdomen is soft.  Genitourinary:    Comments: Deferred  Musculoskeletal:        General: Normal range of motion.     Cervical back: Normal range of motion and neck supple.  Feet:     Right foot:     Protective Sensation: 5 sites tested.  5 sites sensed.     Skin integrity: Callus and dry skin present.     Toenail Condition: Right toenails are normal.  Left foot:     Protective Sensation: 5 sites tested.  5 sites sensed.     Skin integrity: Callus and dry skin present.     Toenail Condition: Left toenails are normal.  Skin:    General: Skin is warm.  Neurological:     General: No focal deficit present.     Mental Status: He is alert.  Psychiatric:        Mood and Affect: Mood normal.        Behavior: Behavior normal.     Assessment And Plan:    1. Encounter for annual physical exam Comments: A full exam was performed. DRE deferred.  2. Type 2 diabetes mellitus with stage 2 chronic kidney disease, with long-term current use of insulin (HCC) Comments: Chronic, diabetic foot exam was performed.  He will c/w Trulicity and metformin.  He will f/u in 4 months for re-evaluation. - POCT Urinalysis Dipstick (81002) - Microalbumin / creatinine urine ratio - EKG 12-Lead - CMP14+EGFR - Lipid panel - Hemoglobin A1c  3. Hypertensive nephropathy Comments: Chronic, fair control. Goal BP<130/80. It is important to aim for at least 150 minutes of exercise/week and to follow low sodium diet. - POCT Urinalysis Dipstick (81002) - Microalbumin / creatinine urine ratio - EKG 12-Lead - CMP14+EGFR - Lipid panel - Hemoglobin A1c - TSH  4. Smoldering myeloma Comments: Recent Oncology notes reviewed. He is currently under observation.  5. Nocturia Comments: DRE declined, he is not followed by Urology. I will check a PSA today. - PSA  6. Other  dietary vitamin B12 deficiency anemia - Vitamin B12  7. Abnormal CT scan of lung  8. Class 1 obesity due to excess calories with serious comorbidity and body mass index (BMI) of 31.0 to 31.9 in adult Comments: He is encouraged to aim for at least 150 minutes of exercise/week, while striving for BMI<30 to decrease cardiac risk.  9. Flu vaccine need - Flu Vaccine QUAD High Dose(Fluad)  10. Need for Tdap vaccination - Tdap vaccine greater than or equal to 7yo IM  Patient was given opportunity to ask questions. Patient verbalized understanding of the plan and was able to repeat key elements of the plan. All questions were answered to their satisfaction.   I, [name], MD, have reviewed all documentation for this visit. The documentation on 08/31/22 for the exam, diagnosis, procedures, and orders are all accurate and complete.   THE PATIENT IS ENCOURAGED TO PRACTICE SOCIAL DISTANCING DUE TO THE COVID-19 PANDEMIC.

## 2022-09-03 ENCOUNTER — Other Ambulatory Visit (HOSPITAL_COMMUNITY): Payer: Self-pay

## 2022-09-03 ENCOUNTER — Other Ambulatory Visit: Payer: Self-pay

## 2022-09-03 MED ORDER — DULAGLUTIDE 1.5 MG/0.5ML ~~LOC~~ SOAJ
1.5000 mg | SUBCUTANEOUS | 2 refills | Status: DC
  Filled 2022-09-03: qty 2, 28d supply, fill #0

## 2022-09-08 ENCOUNTER — Other Ambulatory Visit (HOSPITAL_COMMUNITY): Payer: Self-pay

## 2022-09-10 ENCOUNTER — Other Ambulatory Visit: Payer: Self-pay | Admitting: Internal Medicine

## 2022-09-10 DIAGNOSIS — K080 Exfoliation of teeth due to systemic causes: Secondary | ICD-10-CM | POA: Diagnosis not present

## 2022-09-10 DIAGNOSIS — E1122 Type 2 diabetes mellitus with diabetic chronic kidney disease: Secondary | ICD-10-CM

## 2022-09-10 DIAGNOSIS — R918 Other nonspecific abnormal finding of lung field: Secondary | ICD-10-CM

## 2022-09-10 DIAGNOSIS — D472 Monoclonal gammopathy: Secondary | ICD-10-CM

## 2022-09-10 DIAGNOSIS — I129 Hypertensive chronic kidney disease with stage 1 through stage 4 chronic kidney disease, or unspecified chronic kidney disease: Secondary | ICD-10-CM

## 2022-09-19 ENCOUNTER — Other Ambulatory Visit (HOSPITAL_COMMUNITY): Payer: Self-pay

## 2022-10-01 ENCOUNTER — Other Ambulatory Visit: Payer: Self-pay | Admitting: Internal Medicine

## 2022-10-06 ENCOUNTER — Other Ambulatory Visit: Payer: Self-pay | Admitting: Internal Medicine

## 2022-10-09 ENCOUNTER — Telehealth: Payer: Self-pay

## 2022-10-09 NOTE — Progress Notes (Cosign Needed Addendum)
10-09-2022: Contacted [employer] [facility] for trulicity shipment  and was informed medication will be delivered to patient's home on 10-14-2022. Was able to enroll patient into automatic refills. Patient's wife was informed.  Huey Romans [facility] Clinical Pharmacist Assistant [phone]

## 2022-10-19 ENCOUNTER — Other Ambulatory Visit: Payer: Self-pay | Admitting: Internal Medicine

## 2022-10-21 ENCOUNTER — Telehealth: Payer: Self-pay

## 2022-10-21 NOTE — Progress Notes (Signed)
Patient dropped a letter off to Dr Allyne Gee regarding [employer] [facility] application and more information needed. I refaxed re-enrollment application with updated dates, everything requested has been verified and faxed to [employer] patient assistance program for Trulicity.     Billee Cashing, CMA Clinical Pharmacist Assistant [phone]

## 2022-10-22 ENCOUNTER — Telehealth: Payer: Self-pay

## 2022-10-22 NOTE — Telephone Encounter (Signed)
[facility] denied patient assistance application faxed on 10/21/2022. HC to reach out to [facility] program to find out the status of his medication delivery.  [name], CPP, PharmD Clinical Pharmacist Practitioner [facility] [phone]

## 2022-10-23 ENCOUNTER — Telehealth: Payer: Self-pay

## 2022-10-23 NOTE — Progress Notes (Addendum)
10-23-2022: Contacted [employer] [facility] to follow up on trulicity shipment and denial letter received. Representative stated the denial letter received was because application is approved until 06-23-2023. Representative then stated application must was sent in twice but patient is already approved. Informed representative that I spoke with a pharmacy rep on 10-09-2022 and was told patient would receive delivery on 10-14-2022 but no delivery received. The representative then transferred me to the pharmacy and I was told that no one documented call made on 10-09-2022 but shipment will be processed urgently. Then waited on hold while shipment was approved. Pharmacist stated patient should receive trulicity tomorrow. Patient informed.  Huey Romans [facility] Clinical Pharmacist Assistant [phone]

## 2022-10-28 ENCOUNTER — Telehealth: Payer: Self-pay

## 2022-10-28 NOTE — Telephone Encounter (Signed)
Spoke with William Mcguire who reports that his medication from patient assistance came in on Monday for a 4 month supply for the 1.5 mg dose. Patient reports that his BS have been running well in the 120's. William Mcguire informed me that the expiration date of his medication was not until May 2025 and 2026. A follow up appointment was scheduled for 11/07/2022 at 9 AM.    [name], CPP, PharmD Clinical Pharmacist Practitioner [facility] [phone]

## 2022-11-05 ENCOUNTER — Ambulatory Visit (INDEPENDENT_AMBULATORY_CARE_PROVIDER_SITE_OTHER): Payer: Medicare Other | Admitting: Internal Medicine

## 2022-11-05 ENCOUNTER — Ambulatory Visit (INDEPENDENT_AMBULATORY_CARE_PROVIDER_SITE_OTHER): Payer: Medicare Other

## 2022-11-05 ENCOUNTER — Encounter: Payer: Self-pay | Admitting: Internal Medicine

## 2022-11-05 ENCOUNTER — Telehealth: Payer: Self-pay

## 2022-11-05 VITALS — BP 140/80 | HR 80 | Temp 98.0°F | Ht 73.8 in | Wt 251.6 lb

## 2022-11-05 VITALS — BP 160/100 | HR 80 | Temp 98.0°F | Ht 73.0 in | Wt 251.0 lb

## 2022-11-05 DIAGNOSIS — Z794 Long term (current) use of insulin: Secondary | ICD-10-CM

## 2022-11-05 DIAGNOSIS — E6609 Other obesity due to excess calories: Secondary | ICD-10-CM

## 2022-11-05 DIAGNOSIS — E1122 Type 2 diabetes mellitus with diabetic chronic kidney disease: Secondary | ICD-10-CM

## 2022-11-05 DIAGNOSIS — I129 Hypertensive chronic kidney disease with stage 1 through stage 4 chronic kidney disease, or unspecified chronic kidney disease: Secondary | ICD-10-CM | POA: Diagnosis not present

## 2022-11-05 DIAGNOSIS — N182 Chronic kidney disease, stage 2 (mild): Secondary | ICD-10-CM | POA: Diagnosis not present

## 2022-11-05 DIAGNOSIS — Z6833 Body mass index (BMI) 33.0-33.9, adult: Secondary | ICD-10-CM

## 2022-11-05 DIAGNOSIS — J069 Acute upper respiratory infection, unspecified: Secondary | ICD-10-CM | POA: Diagnosis not present

## 2022-11-05 DIAGNOSIS — Z0000 Encounter for general adult medical examination without abnormal findings: Secondary | ICD-10-CM | POA: Diagnosis not present

## 2022-11-05 NOTE — Patient Instructions (Signed)

## 2022-11-05 NOTE — Progress Notes (Signed)
I,[name],acting as a scribe for [name], MD.,have documented all relevant documentation on the behalf of [name], MD,as directed by  [name], MD while in the presence of [name], MD.    Subjective:     Patient ID: William Mcguire , male    DOB: 1955/11/11 , 67 y.o.   MRN: [id number]   No chief complaint on file.   HPI  He is here today for diabetes/HTN f/u.  He reports compliance with meds. He denies headache, chest pain, SOB. He reports no specific questions or concerns. He states his sugars range from 80-120. He feels well usually. However, admits he is getting over a "cold". He has taken Equate chlorampheniramine and Alka-Seltzer Plus with some relief of his sx.   AWV completed with [facility] advisor, Herbie Saxon.    Diabetes He presents for his follow-up diabetic visit. He has type 2 diabetes mellitus. His disease course has been stable. There are no hypoglycemic associated symptoms. Pertinent negatives for diabetes include no blurred vision, no chest pain, no polydipsia, no polyphagia and no polyuria. There are no hypoglycemic complications. Diabetic complications include nephropathy. Risk factors for coronary artery disease include diabetes mellitus, dyslipidemia, hypertension, male sex and sedentary lifestyle. His breakfast blood glucose is taken between 8-9 am. His breakfast blood glucose range is generally 90-110 mg/dl.  Hypertension This is a chronic problem. The current episode started more than 1 year ago. The problem has been gradually improving since onset. The problem is controlled. Pertinent negatives include no blurred vision, chest pain, palpitations or shortness of breath.     Past Medical History:  Diagnosis Date   Diabetes mellitus    High cholesterol    Hypertension      Family History  Problem Relation Age of Onset   Hypertension Mother    Multiple myeloma Mother    Hypertension Father    Diabetes Father      Current  Outpatient Medications:    amLODipine (NORVASC) 10 MG tablet, Take 1 tablet (10 mg total) by mouth daily., Disp: 90 tablet, Rfl: 1   aspirin EC 81 MG tablet, Take 81 mg by mouth daily., Disp: , Rfl:    atorvastatin (LIPITOR) 40 MG tablet, TAKE 1 TABLET BY MOUTH EVERY DAY, Disp: 90 tablet, Rfl: 1   B-D ULTRAFINE III SHORT PEN 31G X 8 MM MISC, USE AS DIRECTED, Disp: 100 each, Rfl: PRN   cyanocobalamin (,VITAMIN B-12,) 1000 MCG/ML injection, Inject 1 mL (1,000 mcg total) into the muscle every 30 (thirty) days., Disp: 9 mL, Rfl: 1   Dulaglutide 1.5 MG/0.5ML SOPN, Inject 1.5 mg into the skin once a week., Disp: 6 mL, Rfl: 2   enalapril (VASOTEC) 10 MG tablet, TAKE 1 TABLET BY MOUTH EVERY DAY, Disp: 90 tablet, Rfl: 2   glucose blood (ONETOUCH VERIO) test strip, Use as instructed to check blood sugars twice daily E11.69, Disp: 100 each, Rfl: 2   glucose blood test strip, Use as instructed to test blood sugar 3 times a day. Dx code: e11.65, Disp: 100 each, Rfl: 2   Insulin Glargine ([name] KWIKPEN) 100 UNIT/ML, Inject 0.22 mLs (22 Units total) into the skin at bedtime., Disp: 45 mL, Rfl: 3   metFORMIN (GLUCOPHAGE) 1000 MG tablet, TAKE 1 TABLET BY MOUTH TWICE A DAY WITH FOOD, Disp: 180 tablet, Rfl: 0   sildenafil (VIAGRA) 100 MG tablet, TAKE 1 TABLET BY MOUTH EVERY DAY AS NEEDED, Disp: 10 tablet, Rfl: 5   SYRINGE-NEEDLE,  DISP, 3 ML (B-D INTEGRA SYRINGE) 25G X 5/8" 3 ML MISC, USE AS DIRECTED, Disp: 12 each, Rfl: 24  Current Facility-Administered Medications:    cyanocobalamin ((VITAMIN B-12)) injection 1,000 mcg, 1,000 mcg, Intramuscular, Once, [name], MD   No Known Allergies   Review of Systems  Constitutional: Negative.   HENT:  Positive for congestion.   Eyes:  Negative for blurred vision.  Respiratory: Negative.  Negative for shortness of breath.   Cardiovascular:  Negative for chest pain and palpitations.  Gastrointestinal: Negative.   Endocrine: Negative for polydipsia, polyphagia  and polyuria.  Genitourinary: Negative.   Skin: Negative.   Allergic/Immunologic: Negative.   Neurological: Negative.   Hematological: Negative.      Today's Vitals   11/05/22 1113 11/05/22 1115  BP: (!) 142/80 (!) 160/100  Pulse: 80   Temp: 98 F (36.7 C)   SpO2: 98%   Weight: 251 lb (113.9 kg)   Height: 6\' 1"  (1.854 m)    Wt Readings from Last 3 Encounters:  11/05/22 251 lb (113.9 kg)  11/05/22 251 lb 9.6 oz (114.1 kg)  08/28/22 252 lb (114.3 kg)    BP Readings from Last 3 Encounters:  11/05/22 (!) 160/100  11/05/22 (!) 140/80  08/28/22 (!) 150/78      Body mass index is 33.12 kg/m.   ++ Objective:  Physical Exam Vitals and nursing note reviewed.  Constitutional:      Appearance: Normal appearance.  HENT:     Head: Normocephalic and atraumatic.     Comments: Sounds congested Eyes:     Extraocular Movements: Extraocular movements intact.     Comments: Injected sclerae  Cardiovascular:     Rate and Rhythm: Normal rate and regular rhythm.     Heart sounds: Normal heart sounds.  Pulmonary:     Effort: Pulmonary effort is normal.     Breath sounds: Normal breath sounds.  Musculoskeletal:     Cervical back: Normal range of motion.  Skin:    General: Skin is warm.  Neurological:     General: No focal deficit present.     Mental Status: He is alert.  Psychiatric:        Mood and Affect: Mood normal.     Assessment And Plan:     1. Type 2 diabetes mellitus with stage 2 chronic kidney disease, with long-term current use of insulin (HCC) Comments: Chronic, I will check labs as below. He will c/w [name] nightly, metformin and Trulicity 1.5mg  weekly. He agrees to f/u in 3-4 months. - Hemoglobin A1c - BMP8+eGFR  2. Hypertensive nephropathy Comments: Uncontrolled, possibly due to decongestant use. Since BP was elevated at last visit as well. I will increase enalapril to 20mg  daily. F/u in 2 wks for NV. - BMP8+EGFR; Future  3. Upper respiratory tract  infection, unspecified type Comments: Resolving. He has not done any home COVID testing, declines testing today. Advised to stop Catering manager, may take chlorpheniramine prn.  4. Class 1 obesity due to excess calories with serious comorbidity and body mass index (BMI) of 33.0 to 33.9 in adult Comments: Chronic, he is encouraged to aim for at least 150 minutes of exercise/week.  Return in 2 weeks (on 11/19/2022), or NV - bp check/lab, for 1 year OV with RS & THN. 4 month bp & dm.   Patient was given opportunity to ask questions. Patient verbalized understanding of the plan and was able to repeat key elements of the plan. All questions were answered to their  satisfaction.   I, [name], MD, have reviewed all documentation for this visit. The documentation on 11/05/22 for the exam, diagnosis, procedures, and orders are all accurate and complete.   IF YOU HAVE BEEN REFERRED TO A SPECIALIST, IT MAY TAKE 1-2 WEEKS TO SCHEDULE/PROCESS THE REFERRAL. IF YOU HAVE NOT HEARD FROM US/SPECIALIST IN TWO WEEKS, PLEASE GIVE [place] A CALL AT [phone] X [phone].   THE PATIENT IS ENCOURAGED TO PRACTICE SOCIAL DISTANCING DUE TO THE COVID-19 PANDEMIC.

## 2022-11-05 NOTE — Progress Notes (Signed)
Subjective:   William Mcguire is a 67 y.o. male who presents for Medicare Annual/Subsequent preventive examination.  Review of Systems     Cardiac Risk Factors include: advanced age (>79men, >4 women);diabetes mellitus;hypertension;male gender;obesity (BMI >30kg/m2)     Objective:    Today's Vitals   11/05/22 1056 11/05/22 1113  BP: (!) 142/80 (!) 140/80  Pulse: 80   Temp: 98 F (36.7 C)   TempSrc: Oral   SpO2: 99%   Weight: 251 lb 9.6 oz (114.1 kg)   Height: 6' 1.8" (1.875 m)    Body mass index is 32.48 kg/m.     11/05/2022   11:04 AM 06/02/2022    7:25 AM 10/30/2021   12:21 PM 11/30/2019   10:19 AM 11/30/2019    9:43 AM 06/11/2011   11:34 AM  Advanced Directives  Does Patient Have a Medical Advance Directive? Yes No Yes No Yes Patient does not have advance directive;Patient would not like information  Type of Public librarian Power of [employer];Living will  Living will Healthcare Power of [employer] Power of [place];Living will   Does patient want to make changes to medical advance directive?    Yes (MAU/Ambulatory/Procedural Areas - Information given) No - Patient declined   Copy of Healthcare Power of Attorney in Chart? No - copy requested   No - copy requested No - copy requested   Would patient like information on creating a medical advance directive?  No - Patient declined  No - Patient declined      Current Medications (verified) Outpatient Encounter Medications as of 11/05/2022  Medication Sig   amLODipine (NORVASC) 10 MG tablet Take 1 tablet (10 mg total) by mouth daily.   aspirin EC 81 MG tablet Take 81 mg by mouth daily.   atorvastatin (LIPITOR) 40 MG tablet TAKE 1 TABLET BY MOUTH EVERY DAY   B-D ULTRAFINE III SHORT PEN 31G X 8 MM MISC USE AS DIRECTED   cyanocobalamin (,VITAMIN B-12,) 1000 MCG/ML injection Inject 1 mL (1,000 mcg total) into the muscle every 30 (thirty) days.   Dulaglutide 1.5 MG/0.5ML SOPN Inject 1.5 mg into the skin once  a week.   enalapril (VASOTEC) 10 MG tablet TAKE 1 TABLET BY MOUTH EVERY DAY   glucose blood (ONETOUCH VERIO) test strip Use as instructed to check blood sugars twice daily E11.69   glucose blood test strip Use as instructed to test blood sugar 3 times a day. 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   metFORMIN (GLUCOPHAGE) 1000 MG tablet TAKE 1 TABLET BY MOUTH TWICE A DAY WITH FOOD   sildenafil (VIAGRA) 100 MG tablet TAKE 1 TABLET BY MOUTH EVERY DAY AS NEEDED   SYRINGE-NEEDLE, DISP, 3 ML (B-D INTEGRA SYRINGE) 25G X 5/8" 3 ML MISC USE AS DIRECTED   Facility-Administered Encounter Medications as of 11/05/2022  Medication   cyanocobalamin ((VITAMIN B-12)) injection 1,000 mcg    Allergies (verified) Patient has no known allergies.   History: Past Medical History:  Diagnosis Date   Diabetes mellitus    High cholesterol    Hypertension    Past Surgical History:  Procedure Laterality Date   KNEE SURGERY     torn cartilage   PATELLAR TENDON REPAIR  06/11/2011   Procedure: PATELLA TENDON REPAIR;  Surgeon: Cammy Copa;  Location: [facility];  Service: Orthopedics;  Laterality: Right;   Family History  Problem Relation Age of  Onset   Hypertension Mother    Multiple myeloma Mother    Hypertension Father    Diabetes Father    Social History   Socioeconomic History   Marital status: Married    Spouse name: Not on file   Number of children: Not on file   Years of education: Not on file   Highest education level: Not on file  Occupational History   Not on file  Tobacco Use   Smoking status: Never   Smokeless tobacco: Never   Tobacco comments:    n/a  Vaping Use   Vaping Use: Never used  Substance and Sexual Activity   Alcohol use: No   Drug use: No   Sexual activity: Not on file  Other Topics Concern   Not on file  Social History Narrative   Not on file   Social Determinants of Health    Financial Resource Strain: Low Risk  (11/05/2022)   Overall Financial Resource Strain (CARDIA)    Difficulty of Paying Living Expenses: Not hard at all  Food Insecurity: No Food Insecurity (11/05/2022)   Hunger Vital Sign    Worried About Running Out of Food in the Last Year: Never true    Ran Out of Food in the Last Year: Never true  Transportation Needs: No Transportation Needs (11/05/2022)   PRAPARE - Administrator, Civil Service (Medical): No    Lack of Transportation (Non-Medical): No  Physical Activity: Sufficiently Active (11/05/2022)   Exercise Vital Sign    Days of Exercise per Week: 7 days    Minutes of Exercise per Session: 60 min  Stress: No Stress Concern Present (11/05/2022)   [employer] of Occupational Health - Occupational Stress Questionnaire    Feeling of Stress : Not at all  Social Connections: Not on file    Tobacco Counseling Counseling given: Not Answered Tobacco comments: n/a   Clinical Intake:  Pre-visit preparation completed: Yes  Pain : No/denies pain     Nutritional Status: BMI > 30  Obese Nutritional Risks: None Diabetes: Yes  How often do you need to have someone help you when you read instructions, pamphlets, or other written materials from your doctor or pharmacy?: 1 - Never  Diabetic? Yes Nutrition Risk Assessment:  Has the patient had any N/V/D within the last 2 months?  No  Does the patient have any non-healing wounds?  No  Has the patient had any unintentional weight loss or weight gain?  No   Diabetes:  Is the patient diabetic?  Yes  If diabetic, was a CBG obtained today?  No  Did the patient bring in their glucometer from home?  No  How often do you monitor your CBG's? daily.   Financial Strains and Diabetes Management:  Are you having any financial strains with the device, your supplies or your medication? No .  Does the patient want to be seen by Chronic Care Management for management of their diabetes?   No  Would the patient like to be referred to a Nutritionist or for Diabetic Management?  No   Diabetic Exams:  Diabetic Eye Exam: Completed 06/24/2022 Diabetic Foot Exam: Completed 08/18/2022   Interpreter Needed?: No  Information entered by :: NAllen LPN   Activities of Daily Living    11/05/2022   11:05 AM  In your present state of health, do you have any difficulty performing the following activities:  Hearing? 0  Vision? 0  Difficulty concentrating or making decisions? 0  Walking or climbing stairs? 0  Dressing or bathing? 0  Doing errands, shopping? 0  Preparing Food and eating ? N  Using the Toilet? N  In the past six months, have you accidently leaked urine? N  Do you have problems with loss of bowel control? N  Managing your Medications? N  Managing your Finances? N  Housekeeping or managing your Housekeeping? N    Patient Care Team: [name], MD as PCP - General (Internal Medicine)  Indicate any recent Medical Services you may have received from other than [facility] providers in the past year (date may be approximate).     Assessment:   This is a routine wellness examination for Mahir.  Hearing/Vision screen Vision Screening - Comments:: Regular eye exams,   Dietary issues and exercise activities discussed: Current Exercise Habits: Home exercise routine, Type of exercise: treadmill;walking, Time (Minutes): 60, Frequency (Times/Week): 7, Weekly Exercise (Minutes/Week): 420   Goals Addressed             This Visit's Progress    Patient Stated       11/05/2022, wants to lose more weight       Depression Screen    11/05/2022   11:05 AM 08/18/2022    2:24 PM 08/18/2022    2:18 PM 10/30/2021   12:22 PM 01/21/2021    8:43 AM 11/30/2019    9:38 AM 04/25/2019    2:12 PM  PHQ 2/9 Scores  PHQ - 2 Score 0 0 0 0 0 0 0  PHQ- 9 Score     0      Fall Risk    11/05/2022   11:05 AM 08/18/2022    2:18 PM 10/30/2021   12:21 PM 01/21/2021    8:43 AM 11/30/2019     9:38 AM  Fall Risk   Falls in the past year? 0 0 0 0 0  Number falls in past yr: 0 0 0 0   Injury with Fall? 0 0 0 0   Risk for fall due to : Medication side effect No Fall Risks Medication side effect    Follow up Falls prevention discussed;Education provided;Falls evaluation completed Falls evaluation completed Falls evaluation completed;Education provided;Falls prevention discussed      FALL RISK PREVENTION PERTAINING TO THE HOME:  Any stairs in or around the home? Yes  If so, are there any without handrails? No  Home free of loose throw rugs in walkways, pet beds, electrical cords, etc? Yes  Adequate lighting in your home to reduce risk of falls? Yes   ASSISTIVE DEVICES UTILIZED TO PREVENT FALLS:  Life alert? No  Use of a cane, walker or w/c? No  Grab bars in the bathroom? No  Shower chair or bench in shower? Yes  Elevated toilet seat or a handicapped toilet? Yes   TIMED UP AND GO:  Was the test performed? Yes .  Length of time to ambulate 10 feet: 5 sec.   Gait steady and fast without use of assistive device  Cognitive Function:        11/05/2022   11:06 AM 10/30/2021   12:22 PM 11/30/2019    9:41 AM  6CIT Screen  What Year? 0 points 0 points 0 points  What month? 0 points 0 points 0 points  What time? 0 points 0 points 0 points  Count back from 20 0 points 0 points 0 points  Months in reverse 4 points 4 points 2 points  Repeat phrase 2 points 6 points  0 points  Total Score 6 points 10 points 2 points    Immunizations Immunization History  Administered Date(s) Administered   Fluad Quad(high Dose 65+) 04/03/2020, 06/04/2021, 08/18/2022   Influenza Split 06/12/2011   Influenza, High Dose Seasonal PF 03/22/2019   Influenza,inj,Quad PF,6+ Mos 05/05/2018   Influenza-Unspecified 03/22/2019   Moderna Sars-Covid-2 Vaccination 09/10/2019, 10/08/2019, 05/01/2020   PNEUMOCOCCAL CONJUGATE-20 10/07/2021   Pfizer Covid-19 Vaccine Bivalent Booster 37yrs & up 05/13/2021    Pneumococcal Polysaccharide-23 04/25/2019   Tdap 08/18/2022   Zoster Recombinat (Shingrix) 11/05/2021, 03/31/2022    TDAP status: Up to date  Flu Vaccine status: Up to date  Pneumococcal vaccine status: Up to date  Covid-19 vaccine status: Completed vaccines  Qualifies for Shingles Vaccine? Yes   Zostavax completed Yes   Shingrix Completed?: Yes  Screening Tests Health Maintenance  Topic Date Due   COVID-19 Vaccine (5 - 2023-24 season) 02/21/2022   Medicare Annual Wellness (AWV)  10/31/2022   INFLUENZA VACCINE  01/22/2023   HEMOGLOBIN A1C  02/16/2023   OPHTHALMOLOGY EXAM  06/25/2023   Diabetic kidney evaluation - Urine ACR  08/19/2023   FOOT EXAM  08/19/2023   Diabetic kidney evaluation - eGFR measurement  08/20/2023   COLONOSCOPY (Pts 45-73yrs Insurance coverage will need to be confirmed)  01/10/2024   DTaP/Tdap/Td (2 - Td or Tdap) 08/18/2032   Pneumonia Vaccine 75+ Years old  Completed   Hepatitis C Screening  Completed   Zoster Vaccines- Shingrix  Completed   HPV VACCINES  Aged Out    Health Maintenance  Health Maintenance Due  Topic Date Due   COVID-19 Vaccine (5 - 2023-24 season) 02/21/2022   Medicare Annual Wellness (AWV)  10/31/2022    Colorectal cancer screening: Type of screening: Colonoscopy. Completed 01/10/2019. Repeat every 5 years  Lung Cancer Screening: (Low Dose CT Chest recommended if Age 25-80 years, 30 pack-year currently smoking OR have quit w/in 15years.) does not qualify.   Lung Cancer Screening Referral: no  Additional Screening:  Hepatitis C Screening: does qualify; Completed 08/29/2015  Vision Screening: Recommended annual ophthalmology exams for early detection of glaucoma and other disorders of the eye. Is the patient up to date with their annual eye exam?  Yes  Who is the provider or what is the name of the office in which the patient attends annual eye exams? Can't remember name If pt is not established with a provider, would they  like to be referred to a provider to establish care? No .   Dental Screening: Recommended annual dental exams for proper oral hygiene  Community Resource Referral / Chronic Care Management: CRR required this visit?  No   CCM required this visit?  No      Plan:     I have personally reviewed and noted the following in the patient's chart:   Medical and social history Use of alcohol, tobacco or illicit drugs  Current medications and supplements including opioid prescriptions. Patient is not currently taking opioid prescriptions. Functional ability and status Nutritional status Physical activity Advanced directives List of other physicians Hospitalizations, surgeries, and ER visits in previous 12 months Vitals Screenings to include cognitive, depression, and falls Referrals and appointments  In addition, I have reviewed and discussed with patient certain preventive protocols, quality metrics, and best practice recommendations. A written personalized care plan for preventive services as well as general preventive health recommendations were provided to patient.     Barb Merino, LPN   1/61/0960   Nurse Notes: none

## 2022-11-05 NOTE — Progress Notes (Signed)
Care Management & Coordination Services Pharmacy Team  Reason for Encounter: Appointment Reminder  Contacted patient to confirm telephone appointment with [name], PharmD on 11-07-2022 at 9:00. Spoke with family on 11/05/2022   Do you have any problems getting your medications? No  What is your top health concern you would like to discuss at your upcoming visit? Patient's wife stated patient is having new issues with sinus.  Have you seen any other providers since your last visit with PCP? Yes   Chart review: Recent office visits:  08-18-2022 [name], MD. Visit for annual exam.   Recent consult visits:  08-28-2022 Artis Delay, MD (Hema/Onc). Follow up visit for abnormal CT scan of lungs.   06-10-2022 Artis Delay, MD (Hema/Onc). Visit for smoldering myeloma.  05-19-2022 Artis Delay, MD (Hema/Onc). Visit for MGUS. Orders placed for CT Bone marrow biopsy and aspiration and DG Bone survey met.  Hospital visits:  None in previous 6 months  Star Rating Drugs:  Atorvastatin 40 mg- Last filled 10-01-2022 90 [facility]. Previous 06-27-2022 90 DS Enalapril 10 mg- Last filled 10-31-2022 90 [facility]. Previous 06-26-2022 90 DS.  Care Gaps: Annual wellness visit in last year? Yes  If Diabetic: Last eye exam / retinopathy screening: 06-24-2022 Last diabetic foot exam: None   Huey Romans [facility] Clinical Pharmacist Assistant [phone]

## 2022-11-05 NOTE — Patient Instructions (Signed)
[name] , Thank you for taking time to come for your Medicare Wellness Visit. I appreciate your ongoing commitment to your health goals. Please review the following plan we discussed and let me know if I can assist you in the future.   These are the goals we discussed:  Goals      Manage My Medicine     Timeframe:  Long-Range Goal Priority:  High Start Date:                             Expected End Date:                       Follow Up Date 09/12/2020    - call for medicine refill 2 or 3 days before it runs out - call if I am sick and can't take my medicine - keep a list of all the medicines I take; vitamins and herbals too - use a pillbox to sort medicine    Why is this important?   These steps will help you keep on track with your medicines.   Notes:  Please call if you have any questions      Patient Stated     10/30/2021, wants to get off diabetic medication     Patient Stated     11/05/2022, wants to lose more weight     Weight (lb) < 200 lb (90.7 kg)        This is a list of the screening recommended for you and due dates:  Health Maintenance  Topic Date Due   COVID-19 Vaccine (5 - 2023-24 season) 02/21/2022   Flu Shot  01/22/2023   Hemoglobin A1C  02/16/2023   Eye exam for diabetics  06/25/2023   Yearly kidney health urinalysis for diabetes  08/19/2023   Complete foot exam   08/19/2023   Yearly kidney function blood test for diabetes  08/20/2023   Medicare Annual Wellness Visit  11/05/2023   Colon Cancer Screening  01/10/2024   DTaP/Tdap/Td vaccine (2 - Td or Tdap) 08/18/2032   Pneumonia Vaccine  Completed   Hepatitis C Screening: USPSTF Recommendation to screen - Ages 47-79 yo.  Completed   Zoster (Shingles) Vaccine  Completed   HPV Vaccine  Aged Out    Advanced directives: Please bring a copy of your POA (Power of William Mcguire) and/or Living Will to your next appointment.   Conditions/risks identified: none  Next appointment: Follow up in one year for  your annual wellness visit.   Preventive Care 13 Years and Older, Male  Preventive care refers to lifestyle choices and visits with your health care provider that can promote health and wellness. What does preventive care include? A yearly physical exam. This is also called an annual well check. Dental exams once or twice a year. Routine eye exams. Ask your health care provider how often you should have your eyes checked. Personal lifestyle choices, including: Daily care of your teeth and gums. Regular physical activity. Eating a healthy diet. Avoiding tobacco and drug use. Limiting alcohol use. Practicing safe sex. Taking low doses of aspirin every day. Taking vitamin and mineral supplements as recommended by your health care provider. What happens during an annual well check? The services and screenings done by your health care provider during your annual well check will depend on your age, overall health, lifestyle risk factors, and family history of disease. Counseling  Your health care  provider may ask you questions about your: Alcohol use. Tobacco use. Drug use. Emotional well-being. Home and relationship well-being. Sexual activity. Eating habits. History of falls. Memory and ability to understand (cognition). Work and work Astronomer. Screening  You may have the following tests or measurements: Height, weight, and BMI. Blood pressure. Lipid and cholesterol levels. These may be checked every 5 years, or more frequently if you are over 79 years old. Skin check. Lung cancer screening. You may have this screening every year starting at age 54 if you have a 30-pack-year history of smoking and currently smoke or have quit within the past 15 years. Fecal occult blood test (FOBT) of the stool. You may have this test every year starting at age 13. Flexible sigmoidoscopy or colonoscopy. You may have a sigmoidoscopy every 5 years or a colonoscopy every 10 years starting at age  37. Prostate cancer screening. Recommendations will vary depending on your family history and other risks. Hepatitis C blood test. Hepatitis B blood test. Sexually transmitted disease (STD) testing. Diabetes screening. This is done by checking your blood sugar (glucose) after you have not eaten for a while (fasting). You may have this done every 1-3 years. Abdominal aortic aneurysm (AAA) screening. You may need this if you are a current or former smoker. Osteoporosis. You may be screened starting at age 37 if you are at high risk. Talk with your health care provider about your test results, treatment options, and if necessary, the need for more tests. Vaccines  Your health care provider may recommend certain vaccines, such as: Influenza vaccine. This is recommended every year. Tetanus, diphtheria, and acellular pertussis (Tdap, Td) vaccine. You may need a Td booster every 10 years. Zoster vaccine. You may need this after age 57. Pneumococcal 13-valent conjugate (PCV13) vaccine. One dose is recommended after age 76. Pneumococcal polysaccharide (PPSV23) vaccine. One dose is recommended after age 55. Talk to your health care provider about which screenings and vaccines you need and how often you need them. This information is not intended to replace advice given to you by your health care provider. Make sure you discuss any questions you have with your health care provider. Document Released: 07/06/2015 Document Revised: 02/27/2016 Document Reviewed: 04/10/2015 Elsevier Interactive Patient Education  2017 [employer].  Fall Prevention in the Home Falls can cause injuries. They can happen to people of all ages. There are many things you can do to make your home safe and to help prevent falls. What can I do on the outside of my home? Regularly fix the edges of walkways and driveways and fix any cracks. Remove anything that might make you trip as you walk through a door, such as a raised step or  threshold. Trim any bushes or trees on the path to your home. Use bright outdoor lighting. Clear any walking paths of anything that might make someone trip, such as rocks or tools. Regularly check to see if handrails are loose or broken. Make sure that both sides of any steps have handrails. Any raised decks and porches should have guardrails on the edges. Have any leaves, snow, or ice cleared regularly. Use sand or salt on walking paths during winter. Clean up any spills in your garage right away. This includes oil or grease spills. What can I do in the bathroom? Use night lights. Install grab bars by the toilet and in the tub and shower. Do not use towel bars as grab bars. Use non-skid mats or decals in the tub  or shower. If you need to sit down in the shower, use a plastic, non-slip stool. Keep the floor dry. Clean up any water that spills on the floor as soon as it happens. Remove soap buildup in the tub or shower regularly. Attach bath mats securely with double-sided non-slip rug tape. Do not have throw rugs and other things on the floor that can make you trip. What can I do in the bedroom? Use night lights. Make sure that you have a light by your bed that is easy to reach. Do not use any sheets or blankets that are too big for your bed. They should not hang down onto the floor. Have a firm chair that has side arms. You can use this for support while you get dressed. Do not have throw rugs and other things on the floor that can make you trip. What can I do in the kitchen? Clean up any spills right away. Avoid walking on wet floors. Keep items that you use a lot in easy-to-reach places. If you need to reach something above you, use a strong step stool that has a grab bar. Keep electrical cords out of the way. Do not use floor polish or wax that makes floors slippery. If you must use wax, use non-skid floor wax. Do not have throw rugs and other things on the floor that can make you  trip. What can I do with my stairs? Do not leave any items on the stairs. Make sure that there are handrails on both sides of the stairs and use them. Fix handrails that are broken or loose. Make sure that handrails are as long as the stairways. Check any carpeting to make sure that it is firmly attached to the stairs. Fix any carpet that is loose or worn. Avoid having throw rugs at the top or bottom of the stairs. If you do have throw rugs, attach them to the floor with carpet tape. Make sure that you have a light switch at the top of the stairs and the bottom of the stairs. If you do not have them, ask someone to add them for you. What else can I do to help prevent falls? Wear shoes that: Do not have high heels. Have rubber bottoms. Are comfortable and fit you well. Are closed at the toe. Do not wear sandals. If you use a stepladder: Make sure that it is fully opened. Do not climb a closed stepladder. Make sure that both sides of the stepladder are locked into place. Ask someone to hold it for you, if possible. Clearly mark and make sure that you can see: Any grab bars or handrails. First and last steps. Where the edge of each step is. Use tools that help you move around (mobility aids) if they are needed. These include: Canes. Walkers. Scooters. Crutches. Turn on the lights when you go into a dark area. Replace any light bulbs as soon as they burn out. Set up your furniture so you have a clear path. Avoid moving your furniture around. If any of your floors are uneven, fix them. If there are any pets around you, be aware of where they are. Review your medicines with your doctor. Some medicines can make you feel dizzy. This can increase your chance of falling. Ask your doctor what other things that you can do to help prevent falls. This information is not intended to replace advice given to you by your health care provider. Make sure you discuss any questions you  have with your  health care provider. Document Released: 04/05/2009 Document Revised: 11/15/2015 Document Reviewed: 07/14/2014 Elsevier Interactive Patient Education  2017 [employer].

## 2022-11-06 LAB — BMP8+EGFR
BUN/Creatinine Ratio: 9 — ABNORMAL LOW (ref 10–24)
BUN: 11 mg/dL (ref 8–27)
CO2: 26 mmol/L (ref 20–29)
Calcium: 9.5 mg/dL (ref 8.6–10.2)
Chloride: 100 mmol/L (ref 96–106)
Creatinine, Ser: 1.19 mg/dL (ref 0.76–1.27)
Glucose: 154 mg/dL — ABNORMAL HIGH (ref 70–99)
Potassium: 4.7 mmol/L (ref 3.5–5.2)
Sodium: 137 mmol/L (ref 134–144)
eGFR: 67 mL/min/{1.73_m2} (ref >59)

## 2022-11-06 LAB — HEMOGLOBIN A1C
Est. average glucose Bld gHb Est-mCnc: 166 mg/dL
Hgb A1c MFr Bld: 7.4 % — ABNORMAL HIGH (ref 4.8–5.6)

## 2022-11-07 ENCOUNTER — Ambulatory Visit: Payer: Medicare Other

## 2022-11-07 NOTE — Progress Notes (Signed)
Care Management & Coordination Services Pharmacy Note  11/07/2022 Name:  William Mcguire MRN:  [id number] DOB:  Jun 22, 1956  Summary: Patient reports for a follow up visit with the pharmacist for C  Recommendations/Changes made from today's visit: Recommend Mr. Frenkel take his medication at the same time each day.  Mr. Mullings is going to take his medication first thing in the morning after breakfast, placing his medication on the breakfast table in front of him to take it Start checking his BP at home at least once per day.   Follow up plan: Patient to start writing down BP readings in a log everyday and HC to review with the patient every 3-4 days starting on Monday.  Review medication adherence with Mr. Rhoads during his assessment calls to see if he took his medications for the day  For the next two weeks Mr. Wey is going to increase his vegetable intake with dinner.    Subjective: William Mcguire is an 67 y.o. year old male who is a primary patient of William Peng, MD.  The care coordination team was consulted for assistance with disease management and care coordination needs.    Engaged with patient by telephone for follow up visit.  Recent office visits:  08-18-2022 William Peng, MD. Visit for annual exam.    Recent consult visits:  08-28-2022 Artis Delay, MD (Hema/Onc). Follow up visit for abnormal CT scan of lungs.    06-10-2022 Artis Delay, MD (Hema/Onc). Visit for smoldering myeloma.   05-19-2022 Artis Delay, MD (Hema/Onc). Visit for MGUS. Orders placed for CT Bone marrow biopsy and aspiration and DG Bone survey met.   Hospital visits:  None in previous 6 months   Objective:  Lab Results  Component Value Date   CREATININE 1.19 11/05/2022   BUN 11 11/05/2022   EGFR 67 11/05/2022   GFRNONAA >60 08/19/2022   GFRAA 70 08/06/2020   NA 137 11/05/2022   K 4.7 11/05/2022   CALCIUM 9.5 11/05/2022   CO2 26 11/05/2022   GLUCOSE 154 (H) 11/05/2022     Lab Results  Component Value Date/Time   HGBA1C 7.4 (H) 11/05/2022 11:52 AM   HGBA1C 7.2 (H) 08/18/2022 03:18 PM   MICROALBUR 30 01/21/2021 12:44 PM   MICROALBUR 30 11/30/2019 09:55 AM    Last diabetic Eye exam:  Lab Results  Component Value Date/Time   HMDIABEYEEXA Retinopathy (A) 06/24/2022 12:00 AM    Last diabetic Foot exam: No results found for: "HMDIABFOOTEX"   Lab Results  Component Value Date   CHOL 139 08/18/2022   HDL 50 08/18/2022   LDLCALC 74 08/18/2022   TRIG 77 08/18/2022   CHOLHDL 2.8 08/18/2022       Latest Ref Rng & Units 08/19/2022    1:14 PM 08/18/2022    3:18 PM 05/19/2022   11:56 AM  Hepatic Function  Total Protein 6.5 - 8.1 g/dL 8.4  7.7  8.0   Albumin 3.5 - 5.0 g/dL 4.3  4.3  4.4   AST 15 - 41 U/L 22  25  23    ALT 0 - 44 U/L 16  17  23    Alk Phosphatase 38 - 126 U/L 96  104  91   Total Bilirubin 0.3 - 1.2 mg/dL 0.5  0.3  0.5     Lab Results  Component Value Date/Time   TSH 1.650 08/18/2022 03:18 PM   TSH 1.720 06/04/2021 11:14 AM   FREET4 1.07 08/06/2020 11:12 AM       Latest  Ref Rng & Units 08/19/2022    1:14 PM 06/02/2022    7:04 AM 05/19/2022   11:56 AM  CBC  WBC 4.0 - 10.5 K/uL 5.8  4.7  4.9   Hemoglobin 13.0 - 17.0 g/dL 78.2  95.6  21.3   Hematocrit 39.0 - 52.0 % 37.0  34.2  35.2   Platelets 150 - 400 K/uL 190  205  187     Lab Results  Component Value Date/Time   VD25OH 22.21 (L) 05/19/2022 11:56 AM   VITAMINB12 444 08/18/2022 03:18 PM   VITAMINB12 544 10/07/2021 12:05 PM    Clinical ASCVD: No  The 10-year ASCVD risk score (Arnett DK, et al., 2019) is: 40.2%   Values used to calculate the score:     Age: 2 years     Sex: Male     Is Non-Hispanic African American: Yes     Diabetic: Yes     Tobacco smoker: No     Systolic Blood Pressure: 160 mmHg     Is BP treated: Yes     HDL Cholesterol: 50 mg/dL     Total Cholesterol: 139 mg/dL       0/86/5784   69:62 AM 08/18/2022    2:24 PM 08/18/2022    2:18 PM   Depression screen PHQ 2/9  Decreased Interest 0 0 0  Down, Depressed, Hopeless 0 0 0  PHQ - 2 Score 0 0 0     Social History   Tobacco Use  Smoking Status Never  Smokeless Tobacco Never  Tobacco Comments   n/a   BP Readings from Last 3 Encounters:  11/05/22 (!) 160/100  11/05/22 (!) 140/80  08/28/22 (!) 150/78   Pulse Readings from Last 3 Encounters:  11/05/22 80  11/05/22 80  08/28/22 75   Wt Readings from Last 3 Encounters:  11/05/22 251 lb (113.9 kg)  11/05/22 251 lb 9.6 oz (114.1 kg)  08/28/22 252 lb (114.3 kg)   BMI Readings from Last 3 Encounters:  11/05/22 33.12 kg/m  11/05/22 32.48 kg/m  08/28/22 31.50 kg/m    No Known Allergies  Medications Reviewed Today     Reviewed by William Peng, MD (Physician) on 11/05/22 at 1133  Med List Status: <None>   Medication Order Taking? Sig Documenting Provider Last Dose Status Informant  amLODipine (NORVASC) 10 MG tablet [id number] Yes Take 1 tablet (10 mg total) by mouth daily. William Peng, MD Taking Active   aspirin EC 81 MG tablet [id number] Yes Take 81 mg by mouth daily. [provider] Taking Active Multiple Informants  atorvastatin (LIPITOR) 40 MG tablet [id number] Yes TAKE 1 TABLET BY MOUTH EVERY DAY William Peng, MD Taking Active   B-D ULTRAFINE III SHORT PEN 31G X 8 MM MISC [id number] Yes USE AS DIRECTED William Peng, MD Taking Active   cyanocobalamin ((VITAMIN B-12)) injection 1,000 mcg [id number]   William Peng, MD  Active   cyanocobalamin (,VITAMIN B-12,) 1000 MCG/ML injection [id number] Yes Inject 1 mL (1,000 mcg total) into the muscle every 30 (thirty) days. William Peng, MD Taking Active   Dulaglutide 1.5 MG/0.5ML Namon Cirri [id number] Yes Inject 1.5 mg into the skin once a week. William Peng, MD Taking Active   enalapril (VASOTEC) 10 MG tablet [id number] Yes TAKE 1 TABLET BY MOUTH EVERY DAY William Peng, MD Taking Active   glucose blood [facility] VERIO) test strip [id number] Yes Use as  instructed to check blood sugars twice daily E11.69 William Peng, MD Taking Active   glucose blood  test strip [id number] Yes Use as instructed to test blood sugar 3 times a day. 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 William Peng, MD Taking Active   metFORMIN (GLUCOPHAGE) 1000 MG tablet [id number] Yes TAKE 1 TABLET BY MOUTH TWICE A DAY WITH FOOD William Peng, MD Taking Active   sildenafil (VIAGRA) 100 MG tablet [id number] Yes TAKE 1 TABLET BY MOUTH EVERY DAY AS NEEDED William Peng, MD Taking Active   SYRINGE-NEEDLE, DISP, 3 ML (B-D INTEGRA SYRINGE) 25G X 5/8" 3 ML MISC [id number] Yes USE AS DIRECTED William Peng, MD Taking Active             SDOH:  (Social Determinants of Health) assessments and interventions performed: No SDOH Interventions    Flowsheet Row Clinical Support from 11/05/2022 in [facility] Chronic Care Management from 05/02/2020 in [facility] Chronic Care Management from 01/30/2020 in [facility]  SDOH Interventions     Food Insecurity Interventions Intervention Not Indicated Intervention Not Indicated --  Housing Interventions Intervention Not Indicated Intervention Not Indicated --  Transportation Interventions Intervention Not Indicated Intervention Not Indicated --  Financial Strain Interventions Intervention Not Indicated -- Other (Comment)  [Will continue to assist with Trulicity and Hospital doctor patient assistance programs as needed]  Physical Activity Interventions Intervention Not Indicated -- --  Stress Interventions Intervention Not Indicated -- --       Medication Assistance:  Trul[employer] obtained through [employer]  medication assistance program.  Enrollment ends 05/2023.  Medication Access: Name and location of current  pharmacy:  [id number] [place], [place] - [address] [place] [address] Phone: [phone] Fax: [phone]  [facility] [place] [place] - [address] [place] [address] Phone: [phone] Fax: [phone]  [place] [facility] [address] [place] [address] Phone: [phone] Fax: [phone]  Within the past 30 days, how often has patient missed a dose of medication? 3-6 times  Is a pillbox or other method used to improve adherence? No  Factors that may affect medication adherence? nonadherence to medications and lack of understanding of disease management Are meds synced by current pharmacy? No  Are meds delivered by current pharmacy? No  Does patient experience delays in picking up medications due to transportation concerns? No   Compliance/Adherence/Medication fill history: Care Gaps: Annual wellness visit in last year? Yes  If Diabetic: Last eye exam / retinopathy screening: 06-24-2022 Last diabetic foot exam: None - completed earlier this year in office with Dr. Eliane Decree Drugs: Atorvastatin 40 mg- Last [facility]01-2022 [facility] Previous 06-27-2022 [facility] Enalapril 10 mg- Last filled [facility]-31-2022 [facility] Previous 06-26-2022 90 DS.    Assessment/Plan    Hypertension (BP goal <130/80) -Uncontrolled -Current treatment: Enalapril 20 mg tablet once per day Appropriate, Query effective Taking 2 tablet once per day - started on 11/05/2022  Amlodipine 10 mg tablet once per day Appropriate, Query effective,  -Current home readings- Patient reports that he does not really check his BP at home. He only checked it once yesterday for this visit.  -Current home readings: 138/86, pulse: not read,   -Current dietary habits: eating a high fat, high salt diet. For Breakfast fried hash browns, eggs and  toast with butter from home. He did not  have any lunch. Dinner he had a sausage patty with potatoes - seasoned with no salt and corn. 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 Mcanulty is using a wrist cuff, but he does have an arm BP cuff that is not out dated.  -Mr. Courtland is going to work on his medication adherence by placing his morning medications where he is easily able to see them  -Counseled to monitor BP at home at least once per day, document, and provide log at future appointments over the next two weeks  -HC will reach out to review his BP readings over the next weeks.  -Collaborated with patient to review his diet and lifestyle and to improve medication adherence   [name], CPP, PharmD Clinical Pharmacist Practitioner [facility] [phone]

## 2022-11-10 ENCOUNTER — Telehealth: Payer: Self-pay

## 2022-11-10 NOTE — Progress Notes (Signed)
error 

## 2022-11-12 ENCOUNTER — Telehealth: Payer: Self-pay

## 2022-11-12 NOTE — Progress Notes (Signed)
Care Management & Coordination Services Pharmacy Team  Reason for Encounter: Hypertension  Contacted patient to discuss hypertension disease state. Spoke with patient on 11/12/2022     Current antihypertensive regimen:  Amlodipine 10 mg daily Enalapril 20 mg daily  Patient verbally confirms he is taking the above medications as directed. Yes  How often are you checking your Blood Pressure? twice daily  he checks his blood pressure in the morning before taking his medication. And after.  Current home BP readings: 05-17 122/78, 05-18 140/90 138/78, 05-19 160/90, 05-20 150/87, 05-21 150/90, 05-22 142/90 132/82, 05-23 140/86 Wrist or arm cuff: Arm Caffeine intake: maybe 1-3 cans of soda weekly Salt intake: Limited OTC medications including pseudoephedrine or NSAIDs? None  Any readings above 180/100? No  What recent interventions/DTPs have been made by any provider to improve Blood Pressure control since last CPP Visit:  Patient to start writing down BP readings in a log everyday and HC to review with the patient every 3-4 days starting on Monday.  Review medication adherence with William Mcguire during his assessment calls to see if he took his medications for the day  For the next two weeks William Mcguire is going to increase his vegetable intake with dinner.   Any recent hospitalizations or ED visits since last visit with CPP? No  What diet changes have been made to improve Blood Pressure Control?  Patient stated he has cut down on fried food and is eating more baked. He has increased his fruit, vegetable and water intake.  What exercise is being done to improve your Blood Pressure Control?  Patient stated he does a lot of yard work.  Adherence Review: Is the patient currently on ACE/ARB medication? Yes Does the patient have >5 day gap between last estimated fill dates? No  Star Rating Drugs:  Atorvastatin 40 mg- Last [facility]-2022 [facility] Previous 06-27-2022 90  DS Enalapril 10 mg- Last filled 10-31-2022 [facility] Previous 06-26-2022 90 DS. Metformin 1000 mg- Last filled 10-20-2022 [facility] Previous 07-17-2022 90 DS  Chart Updates: Recent office visits:  None  Recent consult visits:  None  Hospital visits:  None in previous 6 months  Medications: Outpatient Encounter Medications as of 11/12/2022  Medication Sig   amLODipine (NORVASC) 10 MG tablet Take 1 tablet (10 mg total) by mouth daily.   aspirin EC 81 MG tablet Take 81 mg by mouth daily.   atorvastatin (LIPITOR) 40 MG tablet TAKE 1 TABLET BY MOUTH EVERY DAY   B-D ULTRAFINE III SHORT PEN 31G X 8 MM MISC USE AS DIRECTED   cyanocobalamin (,VITAMIN B-12,) 1000 MCG/ML injection Inject 1 mL (1,000 mcg total) into the muscle every 30 (thirty) days.   Dulaglutide 1.5 MG/0.5ML SOPN Inject 1.5 mg into the skin once a week.   enalapril (VASOTEC) 10 MG tablet TAKE 1 TABLET BY MOUTH EVERY DAY   glucose blood (ONETOUCH VERIO) test strip Use as instructed to check blood sugars twice daily E11.69   glucose blood test strip Use as instructed to test blood sugar 3 times a day. 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   metFORMIN (GLUCOPHAGE) 1000 MG tablet TAKE 1 TABLET BY MOUTH TWICE A DAY WITH FOOD   sildenafil (VIAGRA) 100 MG tablet TAKE 1 TABLET BY MOUTH EVERY DAY AS NEEDED   SYRINGE-NEEDLE, DISP, 3 ML (B-D INTEGRA SYRINGE) 25G X 5/8" 3 ML MISC  USE AS DIRECTED   Facility-Administered Encounter Medications as of 11/12/2022  Medication   cyanocobalamin ((VITAMIN B-12)) injection 1,000 mcg    Recent Office Vitals: BP Readings from Last 3 Encounters:  11/05/22 (!) 160/100  11/05/22 (!) 140/80  08/28/22 (!) 150/78   Pulse Readings from Last 3 Encounters:  11/05/22 80  11/05/22 80  08/28/22 75    Wt Readings from Last 3 Encounters:  11/05/22 251 lb (113.9 kg)  11/05/22 251 lb 9.6 oz (114.1 kg)  08/28/22 252  lb (114.3 kg)     Kidney Function Lab Results  Component Value Date/Time   CREATININE 1.19 11/05/2022 11:52 AM   CREATININE 1.14 08/19/2022 01:14 PM   CREATININE 1.18 08/18/2022 03:18 PM   CREATININE 1.29 (H) 05/19/2022 11:56 AM   GFRNONAA >60 08/19/2022 01:14 PM   GFRAA 70 08/06/2020 10:44 AM       Latest Ref Rng & Units 11/05/2022   11:52 AM 08/19/2022    1:14 PM 08/18/2022    3:18 PM  BMP  Glucose 70 - 99 mg/dL 161  77  90   BUN 8 - 27 mg/dL 11  10  13    Creatinine 0.76 - 1.27 mg/dL 0.96  0.45  4.09   BUN/Creat Ratio 10 - 24 9   11    Sodium 134 - 144 mmol/L 137  138  140   Potassium 3.5 - 5.2 mmol/L 4.7  4.1  4.1   Chloride 96 - 106 mmol/L 100  104  104   CO2 20 - 29 mmol/L 26  29  22    Calcium 8.6 - 10.2 mg/dL 9.5  9.1  9.6     [facility] CMA Clinical Pharmacist Assistant [phone]

## 2022-11-18 ENCOUNTER — Inpatient Hospital Stay: Payer: Medicare Other | Attending: Hematology and Oncology

## 2022-11-18 ENCOUNTER — Ambulatory Visit: Payer: Medicare Other

## 2022-11-18 VITALS — BP 130/90 | HR 82 | Temp 98.1°F | Ht 73.0 in | Wt 251.0 lb

## 2022-11-18 DIAGNOSIS — R911 Solitary pulmonary nodule: Secondary | ICD-10-CM | POA: Diagnosis not present

## 2022-11-18 DIAGNOSIS — D539 Nutritional anemia, unspecified: Secondary | ICD-10-CM

## 2022-11-18 DIAGNOSIS — R918 Other nonspecific abnormal finding of lung field: Secondary | ICD-10-CM

## 2022-11-18 DIAGNOSIS — D472 Monoclonal gammopathy: Secondary | ICD-10-CM | POA: Insufficient documentation

## 2022-11-18 DIAGNOSIS — I129 Hypertensive chronic kidney disease with stage 1 through stage 4 chronic kidney disease, or unspecified chronic kidney disease: Secondary | ICD-10-CM

## 2022-11-18 DIAGNOSIS — N182 Chronic kidney disease, stage 2 (mild): Secondary | ICD-10-CM | POA: Insufficient documentation

## 2022-11-18 LAB — CBC WITH DIFFERENTIAL/PLATELET
Abs Immature Granulocytes: 0.04 10*3/uL (ref 0.00–0.07)
Basophils Absolute: 0 10*3/uL (ref 0.0–0.1)
Basophils Relative: 0 %
Eosinophils Absolute: 0.2 10*3/uL (ref 0.0–0.5)
Eosinophils Relative: 3 %
HCT: 33.6 % — ABNORMAL LOW (ref 39.0–52.0)
Hemoglobin: 11.2 g/dL — ABNORMAL LOW (ref 13.0–17.0)
Immature Granulocytes: 1 %
Lymphocytes Relative: 33 %
Lymphs Abs: 1.8 10*3/uL (ref 0.7–4.0)
MCH: 28.6 pg (ref 26.0–34.0)
MCHC: 33.3 g/dL (ref 30.0–36.0)
MCV: 85.7 fL (ref 80.0–100.0)
Monocytes Absolute: 0.6 10*3/uL (ref 0.1–1.0)
Monocytes Relative: 11 %
Neutro Abs: 2.7 10*3/uL (ref 1.7–7.7)
Neutrophils Relative %: 52 %
Platelets: 186 10*3/uL (ref 150–400)
RBC: 3.92 MIL/uL — ABNORMAL LOW (ref 4.22–5.81)
RDW: 13.5 % (ref 11.5–15.5)
WBC: 5.3 10*3/uL (ref 4.0–10.5)
nRBC: 0 % (ref 0.0–0.2)

## 2022-11-18 LAB — COMPREHENSIVE METABOLIC PANEL
ALT: 20 U/L (ref 0–44)
AST: 26 U/L (ref 15–41)
Albumin: 4 g/dL (ref 3.5–5.0)
Alkaline Phosphatase: 78 U/L (ref 38–126)
Anion gap: 5 (ref 5–15)
BUN: 14 mg/dL (ref 8–23)
CO2: 26 mmol/L (ref 22–32)
Calcium: 9.2 mg/dL (ref 8.9–10.3)
Chloride: 105 mmol/L (ref 98–111)
Creatinine, Ser: 1.34 mg/dL — ABNORMAL HIGH (ref 0.61–1.24)
GFR, Estimated: 58 mL/min — ABNORMAL LOW (ref >60)
Glucose, Bld: 152 mg/dL — ABNORMAL HIGH (ref 70–99)
Potassium: 4.2 mmol/L (ref 3.5–5.1)
Sodium: 136 mmol/L (ref 135–145)
Total Bilirubin: 0.4 mg/dL (ref 0.3–1.2)
Total Protein: 8 g/dL (ref 6.5–8.1)

## 2022-11-18 NOTE — Progress Notes (Signed)
Patient presents today for bpc. He currently takes Amlodipine 10MG  at night & Enalapril 10MG  taken in the morning. He admits exercising regularly. Denies headache, chest pain, SOB. Initial bp: 150/90. Bp taken again after 10 minutes:  BP Readings from Last 3 Encounters:  11/18/22 (!) 150/90  11/05/22 (!) 160/100  11/05/22 (!) 140/80  Per Arnette Felts patient is to come back in 2 weeks for another nurse visit.

## 2022-11-19 LAB — KAPPA/LAMBDA LIGHT CHAINS
Kappa free light chain: 197 mg/L — ABNORMAL HIGH (ref 3.3–19.4)
Kappa, lambda light chain ratio: 17.43 — ABNORMAL HIGH (ref 0.26–1.65)
Lambda free light chains: 11.3 mg/L (ref 5.7–26.3)

## 2022-11-24 ENCOUNTER — Telehealth: Payer: Self-pay

## 2022-11-24 ENCOUNTER — Encounter (HOSPITAL_COMMUNITY): Payer: Self-pay

## 2022-11-24 ENCOUNTER — Ambulatory Visit (HOSPITAL_COMMUNITY)
Admission: RE | Admit: 2022-11-24 | Discharge: 2022-11-24 | Disposition: A | Discharge status: Home / Self Care | Payer: Medicare Other | Source: Ambulatory Visit | Attending: Hematology and Oncology | Admitting: Hematology and Oncology

## 2022-11-24 DIAGNOSIS — R918 Other nonspecific abnormal finding of lung field: Secondary | ICD-10-CM | POA: Diagnosis not present

## 2022-11-24 NOTE — Progress Notes (Cosign Needed Addendum)
Care Management & Coordination Services Pharmacy Team  Reason for Encounter: Hypertension  Contacted patient to discuss hypertension disease state. {US HC Outreach:28874}    Current antihypertensive regimen:  ***  Patient verbally confirms he is taking the above medications as directed. {yes/no:20286}  How often are you checking your Blood Pressure? {CHL HP BP Monitoring [phone]}  he checks his blood pressure {timing:25218} {before/after:25217} taking his medication.  Current home BP readings: ***   Wrist or arm cuff: Caffeine intake: Salt intake: OTC medications including pseudoephedrine or NSAIDs?  Any readings above 180/100? {yes/no:20286} If yes any symptoms of hypertensive emergency? {hypertensive emergency symptoms:25354}  What recent interventions/DTPs have been made by any provider to improve Blood Pressure control since last CPP Visit: ***  Any recent hospitalizations or ED visits since last visit with CPP? {yes/no:20286}  What diet changes have been made to improve Blood Pressure Control?  ***  What exercise is being done to improve your Blood Pressure Control?  ***  Adherence Review: Is the patient currently on ACE/ARB medication? {yes/no:20286} Does the patient have >5 day gap between last estimated fill dates? {yes/no:20286}  Star Rating Drugs:    Chart Updates: Recent office visits:  11-18-2022 Randa Lynn T, CMA. Visit for BP check 130/90  Recent consult visits:  None  Hospital visits:  None in previous 6 months  Medications: Outpatient Encounter Medications as of 11/24/2022  Medication Sig   amLODipine (NORVASC) 10 MG tablet Take 1 tablet (10 mg total) by mouth daily.   aspirin EC 81 MG tablet Take 81 mg by mouth daily.   atorvastatin (LIPITOR) 40 MG tablet TAKE 1 TABLET BY MOUTH EVERY DAY   B-D ULTRAFINE III SHORT PEN 31G X 8 MM MISC USE AS DIRECTED   cyanocobalamin (,VITAMIN B-12,) 1000 MCG/ML injection Inject 1 mL  (1,000 mcg total) into the muscle every 30 (thirty) days.   Dulaglutide 1.5 MG/0.5ML SOPN Inject 1.5 mg into the skin once a week.   enalapril (VASOTEC) 10 MG tablet TAKE 1 TABLET BY MOUTH EVERY DAY   glucose blood (ONETOUCH VERIO) test strip Use as instructed to check blood sugars twice daily E11.69   glucose blood test strip Use as instructed to test blood sugar 3 times a day. 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   metFORMIN (GLUCOPHAGE) 1000 MG tablet TAKE 1 TABLET BY MOUTH TWICE A DAY WITH FOOD   sildenafil (VIAGRA) 100 MG tablet TAKE 1 TABLET BY MOUTH EVERY DAY AS NEEDED   SYRINGE-NEEDLE, DISP, 3 ML (B-D INTEGRA SYRINGE) 25G X 5/8" 3 ML MISC USE AS DIRECTED   Facility-Administered Encounter Medications as of 11/24/2022  Medication   cyanocobalamin ((VITAMIN B-12)) injection 1,000 mcg    Recent Office Vitals: BP Readings from Last 3 Encounters:  11/18/22 (!) 130/90  11/05/22 (!) 160/100  11/05/22 (!) 140/80   Pulse Readings from Last 3 Encounters:  11/18/22 82  11/05/22 80  11/05/22 80    Wt Readings from Last 3 Encounters:  11/18/22 251 lb (113.9 kg)  11/05/22 251 lb (113.9 kg)  11/05/22 251 lb 9.6 oz (114.1 kg)     Kidney Function Lab Results  Component Value Date/Time   CREATININE 1.34 (H) 11/18/2022 10:09 AM   CREATININE 1.19 11/05/2022 11:52 AM   CREATININE 1.14 08/19/2022 01:14 PM   CREATININE 1.29 (H) 05/19/2022 11:56 AM   GFRNONAA 58 (L) 11/18/2022 10:09 AM   GFRNONAA >60  08/19/2022 01:14 PM   GFRAA 70 08/06/2020 10:44 AM       Latest Ref Rng & Units 11/18/2022   10:09 AM 11/05/2022   11:52 AM 08/19/2022    1:14 PM  BMP  Glucose 70 - 99 mg/dL 161  096  77   BUN 8 - 23 mg/dL 14  11  10    Creatinine 0.61 - 1.24 mg/dL 0.45  4.09  8.11   BUN/Creat Ratio 10 - 24  9    Sodium 135 - 145 mmol/L 136  137  138   Potassium 3.5 - 5.1 mmol/L 4.2  4.7  4.1   Chloride 98 - 111  mmol/L 105  100  104   CO2 22 - 32 mmol/L 26  26  29    Calcium 8.9 - 10.3 mg/dL 9.2  9.5  9.1      [facility] CMA Clinical Pharmacist Assistant [phone]

## 2022-11-26 LAB — MULTIPLE MYELOMA PANEL, SERUM
Albumin SerPl Elph-Mcnc: 3.6 g/dL (ref 2.9–4.4)
Albumin/Glob SerPl: 0.9 (ref 0.7–1.7)
Alpha 1: 0.2 g/dL (ref 0.0–0.4)
Alpha2 Glob SerPl Elph-Mcnc: 0.8 g/dL (ref 0.4–1.0)
B-Globulin SerPl Elph-Mcnc: 1 g/dL (ref 0.7–1.3)
Gamma Glob SerPl Elph-Mcnc: 2.3 g/dL — ABNORMAL HIGH (ref 0.4–1.8)
Globulin, Total: 4.3 g/dL — ABNORMAL HIGH (ref 2.2–3.9)
IgA: 101 mg/dL (ref 61–437)
IgG (Immunoglobin G), Serum: 2441 mg/dL — ABNORMAL HIGH (ref 603–1613)
IgM (Immunoglobulin M), Srm: 40 mg/dL (ref 20–172)
M Protein SerPl Elph-Mcnc: 1.9 g/dL — ABNORMAL HIGH
Total Protein ELP: 7.9 g/dL (ref 6.0–8.5)

## 2022-11-28 ENCOUNTER — Other Ambulatory Visit: Payer: Self-pay

## 2022-11-28 ENCOUNTER — Inpatient Hospital Stay: Payer: Medicare Other | Attending: Hematology and Oncology | Admitting: Hematology and Oncology

## 2022-11-28 ENCOUNTER — Encounter: Payer: Self-pay | Admitting: Hematology and Oncology

## 2022-11-28 VITALS — BP 149/69 | HR 76 | Temp 97.7°F | Resp 18 | Ht 73.0 in | Wt 249.6 lb

## 2022-11-28 DIAGNOSIS — N182 Chronic kidney disease, stage 2 (mild): Secondary | ICD-10-CM

## 2022-11-28 DIAGNOSIS — I129 Hypertensive chronic kidney disease with stage 1 through stage 4 chronic kidney disease, or unspecified chronic kidney disease: Secondary | ICD-10-CM | POA: Diagnosis not present

## 2022-11-28 DIAGNOSIS — D472 Monoclonal gammopathy: Secondary | ICD-10-CM

## 2022-11-28 DIAGNOSIS — R918 Other nonspecific abnormal finding of lung field: Secondary | ICD-10-CM

## 2022-11-28 DIAGNOSIS — R942 Abnormal results of pulmonary function studies: Secondary | ICD-10-CM | POA: Diagnosis not present

## 2022-11-28 DIAGNOSIS — E1122 Type 2 diabetes mellitus with diabetic chronic kidney disease: Secondary | ICD-10-CM | POA: Diagnosis not present

## 2022-11-28 DIAGNOSIS — C3490 Malignant neoplasm of unspecified part of unspecified bronchus or lung: Secondary | ICD-10-CM | POA: Diagnosis not present

## 2022-11-28 NOTE — Assessment & Plan Note (Signed)
He was noted to have abnormal lung nodule since 2022 Repeat imaging study is a little worse, worrisome for undiagnosed lung cancer I recommend PET/CT imaging and referral to pulmonologist and he agrees He denies chest pain or shortness of breath

## 2022-11-28 NOTE — Assessment & Plan Note (Signed)
I noticed slight worsening anemia and abnormal kidney function but on review of his dietary intake, the patient is likely dehydrated when he presented for blood draw recently Recommend close monitoring and follow-up in 3 months

## 2022-11-28 NOTE — Assessment & Plan Note (Signed)
This is multifactorial He does have history of diabetes and hypertension We discussed importance of hydration and risk factor modification and lifestyle changes 

## 2022-11-28 NOTE — Progress Notes (Signed)
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 and abnormal kidney function but on review of his dietary intake, the patient is likely dehydrated when he presented for blood draw recently Recommend close monitoring and follow-up in 3 months  Abnormal CT scan of lung He was noted to have abnormal lung nodule since 2022 Repeat imaging study is a little worse, worrisome for undiagnosed lung cancer I recommend PET/CT imaging and referral to pulmonologist and he agrees He denies chest pain or shortness of breath  Chronic renal disease, stage II This is multifactorial He does have history of diabetes and hypertension We discussed importance of hydration and risk factor modification and lifestyle changes  Orders Placed This Encounter  Procedures   NM PET Image Initial (PI) Skull Base To Thigh    Standing Status:   Future    Standing Expiration Date:   11/28/2023    Order Specific Question:   If indicated for the ordered procedure, I authorize the administration of a radiopharmaceutical per Radiology protocol    Answer:   Yes    Order Specific Question:   Preferred imaging location?    Answer:   [facility]   Ambulatory referral to Pulmonology    Referral Priority:   Routine    Referral Type:   Consultation    Referral Reason:   Specialty Services Required    Requested Specialty:   Pulmonary Disease    Number of Visits Requested:   1    All questions were answered. The patient knows to call the clinic with any problems, questions or concerns. The total time spent in the appointment was 30 minutes encounter with patients including review of chart and various tests results, discussions about plan of care and coordination of care plan   Artis Delay, MD 11/28/2022 11:02 AM  INTERVAL HISTORY: Please see below for problem oriented  charting. he returns for review test results He denies recent cough, chest pain or shortness of breath He is not drinking enough water, amounted to only a few cups to at most 60 ounces per day We spent a lot of time reviewing blood test results and imaging studies REVIEW OF SYSTEMS:   Constitutional: Denies fevers, chills or abnormal weight loss Eyes: Denies blurriness of vision Ears, nose, mouth, throat, and face: Denies mucositis or sore throat Respiratory: Denies cough, dyspnea or wheezes Cardiovascular: Denies palpitation, chest discomfort or lower extremity swelling Gastrointestinal:  Denies nausea, heartburn or change in bowel habits Skin: Denies abnormal skin rashes Lymphatics: Denies new lymphadenopathy or easy bruising Neurological:Denies numbness, tingling or new weaknesses Behavioral/Psych: Mood is stable, no new changes  All other systems were reviewed with the patient and are negative.  I have reviewed the past medical history, past surgical history, social history and family history with the patient and they are unchanged from previous note.  ALLERGIES:  has No Known Allergies.  MEDICATIONS:  Current Outpatient Medications  Medication Sig Dispense Refill   amLODipine (NORVASC) 10 MG tablet Take 1 tablet (10 mg total) by mouth daily. 90 tablet 1   aspirin EC 81 MG tablet Take 81 mg by mouth daily.     atorvastatin (LIPITOR) 40 MG tablet TAKE 1 TABLET BY MOUTH EVERY DAY 90 tablet 1   B-D ULTRAFINE III SHORT PEN 31G X 8 MM MISC USE AS DIRECTED 100 each PRN   cyanocobalamin (,  VITAMIN B-12,) 1000 MCG/ML injection Inject 1 mL (1,000 mcg total) into the muscle every 30 (thirty) days. 9 mL 1   Dulaglutide 1.5 MG/0.5ML SOPN Inject 1.5 mg into the skin once a week. 6 mL 2   enalapril (VASOTEC) 10 MG tablet TAKE 1 TABLET BY MOUTH EVERY DAY 90 tablet 2   glucose blood (ONETOUCH VERIO) test strip Use as instructed to check blood sugars twice daily E11.69 100 each 2   glucose blood  test strip Use as instructed to test blood sugar 3 times a day. Dx code: e11.65 100 each 2   Insulin Glargine ([name] KWIKPEN) 100 UNIT/ML Inject 0.22 mLs (22 Units total) into the skin at bedtime. 45 mL 3   metFORMIN (GLUCOPHAGE) 1000 MG tablet TAKE 1 TABLET BY MOUTH TWICE A DAY WITH FOOD 180 tablet 0   sildenafil (VIAGRA) 100 MG tablet TAKE 1 TABLET BY MOUTH EVERY DAY AS NEEDED 10 tablet 5   SYRINGE-NEEDLE, DISP, 3 ML (B-D INTEGRA SYRINGE) 25G X 5/8" 3 ML MISC USE AS DIRECTED 12 each 24   Current Facility-Administered Medications  Medication Dose Route Frequency Provider Last Rate Last Admin   cyanocobalamin ((VITAMIN B-12)) injection 1,000 mcg  1,000 mcg Intramuscular Once [name], MD        SUMMARY OF ONCOLOGIC HISTORY: Oncology History Overview Note  Normal cytogenetics   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 No lytic or destructive lesions within the bones. 2. 2.9 cm nodular opacity in the hilar region on the left, unchanged from recent CT. 3. Scattered degenerative changes.   06/02/2022 Bone Marrow Biopsy   BONE MARROW, ASPIRATE, CLOT, CORE: -Variably cellular bone marrow involved by plasma cell neoplasm (10% plasma cells by manual aspirate differential, approximately 10 to 15% by CD138 immunohistochemical stains on clot and core sections, and kappa restricted by kappa/lambda in situ hybridization.).  See comment.  PERIPHERAL BLOOD: -Normocytic normochromic anemia  COMMENT:   Morphological evaluation of the bone marrow clot sections reveals multiple lymphoid aggregates in addition to increased plasma cells.  CD3 and CD20 stains are attempted, however the lymphoid aggregates are lost on deeper levels with a single lymphoid aggregate showing slightly more CD20 staining than CD3.  While flow cytometry is negative for a clonal B-cell population, close clinical and imaging follow-up is recommended along with assessment of peripheral blood and potential sites of  lymphadenopathy/organomegaly as clinically indicated.   MICROSCOPIC DESCRIPTION:  PERIPHERAL BLOOD SMEAR: Platelets: Adequate, no platelet clumps identified Erythroid: Normocytic normochromic anemia Leukocytes: Adequate, negative for dysplastic granulocytes, blasts or plasma cells.  Plasmacytoid lymphocytes present.  BONE MARROW ASPIRATE: Cellular Erythroid precursors: Erythroid precursors show a full sequence of generally orderly maturation Granulocytic precursors: Granulocytic precursors show full sequence of generally orderly maturation Megakaryocytes: Typical in number and morphology Lymphocytes/plasma cells: Plasma cells are increased  TOUCH PREPARATIONS: Confirmatory of the aspirate findings  CLOT AND BIOPSY: The bone marrow clot and biopsy reveal a variably cellular bone marrow (20 to 60%) with trilineage hematopoiesis.  Plasma cells are increased and present singly scattered and in clusters.  Also present are loosely circumscribed lymphoid aggregates with small mature lymphocytes.  The findings are confirmatory of the aspirate and touch prep impression.  SPECIAL stains: CD138: CD138 highlights plasma cells, approximately 10% of cellularity in clot and core sections Kappa/lambda ISH: Kappa/lambda ISH shows kappa restricted plasma cells CD3: CD3 highlights T lymphocytes in the lymphoid aggregates CD20: CD20 highlights B lymphocytes in lymphoid aggregates (slightly more than CD3) CD56: CD56 is  positive in the plasma cells MUM 1: MUM1 is positive in the plasma cells IRON STAIN: Iron stains are performed on a bone marrow aspirate or touch imprint smear and section of clot. The controls stained appropriately.       Storage Iron: Scant      Ring Sideroblasts: Not identified  ADDITIONAL DATA/TESTING: Multiple myeloma panel  CELL COUNT DATA:  Bone Marrow count performed on 500 cells shows: Blasts:   0%   Myeloid:  43% Promyelocytes: 0%   Erythroid:     37% Myelocytes:    5%    Lymphocytes:   7% Metamyelocytes:     2%   Plasma cells:  10% Bands:    4% Neutrophils:   30%  M:E ratio:     1.16 Eosinophils:   2% Basophils:     0% Monocytes:     3%      PHYSICAL EXAMINATION: ECOG PERFORMANCE STATUS: 0 - Asymptomatic  Vitals:   11/28/22 1020  BP: (!) 149/69  Pulse: 76  Resp: 18  Temp: 97.7 F (36.5 C)  SpO2: 100%   Filed Weights   11/28/22 1020  Weight: 249 lb 9.6 oz (113.2 kg)    GENERAL:alert, no distress and comfortable NEURO: alert & oriented x 3 with fluent speech, no focal motor/sensory deficits  LABORATORY DATA:  I have reviewed the data as listed    Component Value Date/Time   NA 136 11/18/2022 1009   NA 137 11/05/2022 1152   K 4.2 11/18/2022 1009   CL 105 11/18/2022 1009   CO2 26 11/18/2022 1009   GLUCOSE 152 (H) 11/18/2022 1009   BUN 14 11/18/2022 1009   BUN 11 11/05/2022 1152   CREATININE 1.34 (H) 11/18/2022 1009   CREATININE 1.14 08/19/2022 1314   CALCIUM 9.2 11/18/2022 1009   PROT 8.0 11/18/2022 1009   PROT 7.7 08/18/2022 1518   ALBUMIN 4.0 11/18/2022 1009   ALBUMIN 4.3 08/18/2022 1518   AST 26 11/18/2022 1009   AST 22 08/19/2022 1314   ALT 20 11/18/2022 1009   ALT 16 08/19/2022 1314   ALKPHOS 78 11/18/2022 1009   BILITOT 0.4 11/18/2022 1009   BILITOT 0.5 08/19/2022 1314   GFRNONAA 58 (L) 11/18/2022 1009   GFRNONAA >60 08/19/2022 1314   GFRAA 70 08/06/2020 1044    No results found for: "SPEP", "UPEP"  Lab Results  Component Value Date   WBC 5.3 11/18/2022   NEUTROABS 2.7 11/18/2022   HGB 11.2 (L) 11/18/2022   HCT 33.6 (L) 11/18/2022   MCV 85.7 11/18/2022   PLT 186 11/18/2022      Chemistry      Component Value Date/Time   NA 136 11/18/2022 1009   NA 137 11/05/2022 1152   K 4.2 11/18/2022 1009   CL 105 11/18/2022 1009   CO2 26 11/18/2022 1009   BUN 14 11/18/2022 1009   BUN 11 11/05/2022 1152   CREATININE 1.34 (H) 11/18/2022 1009   CREATININE 1.14 08/19/2022 1314      Component Value Date/Time    CALCIUM 9.2 11/18/2022 1009   ALKPHOS 78 11/18/2022 1009   AST 26 11/18/2022 1009   AST 22 08/19/2022 1314   ALT 20 11/18/2022 1009   ALT 16 08/19/2022 1314   BILITOT 0.4 11/18/2022 1009   BILITOT 0.5 08/19/2022 1314       RADIOGRAPHIC STUDIES: I have reviewed imaging study with the patient and his wife I have personally reviewed the radiological images as listed and agreed with  the findings in the report. CT CHEST WO CONTRAST  Result Date: 11/27/2022 CLINICAL DATA:  Multiple pulmonary nodules EXAM: CT CHEST WITHOUT CONTRAST TECHNIQUE: Multidetector CT imaging of the chest was performed following the standard protocol without IV contrast. RADIATION DOSE REDUCTION: This exam was performed according to the departmental dose-optimization program which includes automated exposure control, adjustment of the mA and/or kV according to patient size and/or use of iterative reconstruction technique. COMPARISON:  Multiple priors, most recent chest CT dated November 22, 2020 FINDINGS: Cardiovascular: Normal heart size. No pericardial effusion. Normal caliber thoracic aorta with mild calcified plaque. Mild coronary artery calcifications. Mediastinum/Nodes: Esophagus and thyroid are unremarkable. No enlarged lymph nodes seen in the chest. Lungs/Pleura: Central airways are patent. Increased size of solid nodule of the left upper lobe with adjacent ground-glass measuring 3.8 x 2.4 cm in overall diameter with 3.3 x 1.7 cm solid component on series 8, image 88, previously 3.0 x 1.4 cm overall diameter. 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 Stable small ground-glass nodule of the left lower lobe measuring 5 mm on series 8, image 125. Stable ground-glass nodule of the left lower lobe measuring 7 mm on image 84. No pleural effusion or pneumothorax. Upper Abdomen: Stable left adrenal gland adenoma, no specific follow-up imaging is needed for this  lesion. No acute abnormality. Musculoskeletal: No chest wall mass or suspicious bone lesions identified. IMPRESSION: 1. Increased size of solid nodule of the left upper lobe with adjacent ground-glass, highly concerning for primary lung adenocarcinoma. 2. Stable small ground-glass nodules of the left lower lobe. Recommend attention on follow-up. 3. 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 4. Aortic Atherosclerosis (ICD10-I70.0). Electronically Signed   By: Allegra Lai M.D.   On: 11/27/2022 15:46

## 2022-12-01 ENCOUNTER — Telehealth: Payer: Self-pay

## 2022-12-01 NOTE — Telephone Encounter (Signed)
-----   Message from Artis Delay, MD sent at 12/01/2022  7:25 AM EDT ----- I can see him on 6/20 at 2 pm, 45 mins, please schedule

## 2022-12-01 NOTE — Telephone Encounter (Addendum)
Called to offer 6/20 appt. He will be out of town and is leaving after PET appt and will be back on 6/24.  Scheduled appt on 6/25 at 2 pm, he is aware of appt.

## 2022-12-02 ENCOUNTER — Ambulatory Visit: Payer: Medicare Other

## 2022-12-02 VITALS — BP 120/64 | HR 68 | Ht 73.0 in | Wt 249.0 lb

## 2022-12-02 DIAGNOSIS — I129 Hypertensive chronic kidney disease with stage 1 through stage 4 chronic kidney disease, or unspecified chronic kidney disease: Secondary | ICD-10-CM

## 2022-12-02 NOTE — Progress Notes (Signed)
Patient presents today for a bp check.  Patient is currently taking amlodipine 10mg  daily qhs and enalapril 20mg  daily in the morning. Patient reports his bp has been ranging around 140s/80s. After speaking with provider he was advised to continue with current meds decrease salt intake and increase water intake. See MD in 4 weeks.  BP Readings from Last 3 Encounters:  12/02/22 120/64  11/28/22 (!) 149/69  11/18/22 (!) 130/90

## 2022-12-09 ENCOUNTER — Encounter: Payer: Self-pay | Admitting: Hematology and Oncology

## 2022-12-09 ENCOUNTER — Encounter
Admission: RE | Admit: 2022-12-09 | Discharge: 2022-12-09 | Disposition: A | Discharge status: Home / Self Care | Payer: Medicare Other | Source: Ambulatory Visit | Attending: Hematology and Oncology | Admitting: Hematology and Oncology

## 2022-12-09 DIAGNOSIS — C3490 Malignant neoplasm of unspecified part of unspecified bronchus or lung: Secondary | ICD-10-CM | POA: Diagnosis not present

## 2022-12-09 DIAGNOSIS — R911 Solitary pulmonary nodule: Secondary | ICD-10-CM | POA: Diagnosis not present

## 2022-12-09 LAB — GLUCOSE, CAPILLARY: Glucose-Capillary: 78 mg/dL (ref 70–99)

## 2022-12-09 MED ORDER — FLUDEOXYGLUCOSE F - 18 (FDG) INJECTION
12.0000 | Freq: Once | INTRAVENOUS | Status: AC | PRN
  Administered 2022-12-09: 12.97 via INTRAVENOUS

## 2022-12-10 ENCOUNTER — Other Ambulatory Visit: Payer: Self-pay | Admitting: Internal Medicine

## 2022-12-13 ENCOUNTER — Other Ambulatory Visit: Payer: Self-pay | Admitting: Internal Medicine

## 2022-12-16 ENCOUNTER — Encounter: Payer: Self-pay | Admitting: Hematology and Oncology

## 2022-12-16 ENCOUNTER — Other Ambulatory Visit: Payer: Self-pay

## 2022-12-16 ENCOUNTER — Inpatient Hospital Stay: Payer: Medicare Other | Admitting: Hematology and Oncology

## 2022-12-16 VITALS — BP 140/71 | HR 73 | Temp 98.1°F | Resp 18 | Ht 73.0 in | Wt 247.8 lb

## 2022-12-16 DIAGNOSIS — I129 Hypertensive chronic kidney disease with stage 1 through stage 4 chronic kidney disease, or unspecified chronic kidney disease: Secondary | ICD-10-CM | POA: Diagnosis not present

## 2022-12-16 DIAGNOSIS — R942 Abnormal results of pulmonary function studies: Secondary | ICD-10-CM | POA: Diagnosis not present

## 2022-12-16 DIAGNOSIS — R918 Other nonspecific abnormal finding of lung field: Secondary | ICD-10-CM

## 2022-12-16 DIAGNOSIS — E1122 Type 2 diabetes mellitus with diabetic chronic kidney disease: Secondary | ICD-10-CM | POA: Diagnosis not present

## 2022-12-16 DIAGNOSIS — D472 Monoclonal gammopathy: Secondary | ICD-10-CM | POA: Diagnosis not present

## 2022-12-16 DIAGNOSIS — N182 Chronic kidney disease, stage 2 (mild): Secondary | ICD-10-CM | POA: Diagnosis not present

## 2022-12-16 NOTE — Progress Notes (Signed)
[facility] OFFICE PROGRESS NOTE  Patient Care Team: [name], MD as PCP - General (Internal Medicine)  ASSESSMENT & PLAN:  Abnormal CT scan of lung I have reviewed multiple imaging studies with the patient and his wife He is currently not symptomatic PET/CT imaging reviewed hypermetabolic activity on the left upper lobe, concerning for primary lung cancer There is a separate site of hypermetabolic activity on the same lobe but without any correlate of lesion The cause is unknown He is scheduled to see pulmonologist next month I will reach out to the pulmonologist to see if we can expedite his appointment He will likely need bronchoscopy and biopsy to establish diagnosis before referral to CT surgeon I addressed all his questions and concerns  No orders of the defined types were placed in this encounter.   All questions were answered. The patient knows to call the clinic with any problems, questions or concerns. The total time spent in the appointment was 30 minutes encounter with patients including review of chart and various tests results, discussions about plan of care and coordination of care plan   Artis Delay, MD 12/16/2022 2:26 PM  INTERVAL HISTORY: Please see below for problem oriented charting. he returns for review of test results The patient is being followed for smoldering myeloma He has incidental finding on abnormal CT imaging that is being followed Most recent CT chest in June revealed abnormalities leading to PET/CT imaging He is not symptomatic No recent cough or hemoptysis We spent majority of our time reviewing test results and discussed neck step  REVIEW OF SYSTEMS:   Constitutional: Denies fevers, chills or abnormal weight loss Eyes: Denies blurriness of vision Ears, nose, mouth, throat, and face: Denies mucositis or sore throat Respiratory: Denies cough, dyspnea or wheezes Cardiovascular: Denies palpitation, chest discomfort or lower  extremity swelling Gastrointestinal:  Denies nausea, heartburn or change in bowel habits Skin: Denies abnormal skin rashes Lymphatics: Denies new lymphadenopathy or easy bruising Neurological:Denies numbness, tingling or new weaknesses Behavioral/Psych: Mood is stable, no new changes  All other systems were reviewed with the patient and are negative.  I have reviewed the past medical history, past surgical history, social history and family history with the patient and they are unchanged from previous note.  ALLERGIES:  has No Known Allergies.  MEDICATIONS:  Current Outpatient Medications  Medication Sig Dispense Refill   amLODipine (NORVASC) 10 MG tablet TAKE 1 TABLET BY MOUTH EVERY DAY 90 tablet 1   aspirin EC 81 MG tablet Take 81 mg by mouth daily.     atorvastatin (LIPITOR) 40 MG tablet TAKE 1 TABLET BY MOUTH EVERY DAY 90 tablet 1   B-D ULTRAFINE III SHORT PEN 31G X 8 MM MISC USE AS DIRECTED 100 each PRN   cyanocobalamin (VITAMIN B12) 1000 MCG/ML injection INJECT 1 ML (1,000 MCG TOTAL) INTO THE MUSCLE EVERY 30 DAYS. 9 mL 1   Dulaglutide 1.5 MG/0.5ML SOPN Inject 1.5 mg into the skin once a week. 6 mL 2   enalapril (VASOTEC) 10 MG tablet TAKE 1 TABLET BY MOUTH EVERY DAY 90 tablet 2   glucose blood (ONETOUCH VERIO) test strip Use as instructed to check blood sugars twice daily E11.69 100 each 2   glucose blood test strip Use as instructed to test blood sugar 3 times a day. Dx code: e11.65 100 each 2   Insulin Glargine ([name] KWIKPEN) 100 UNIT/ML Inject 0.22 mLs (22 Units total) into the skin at bedtime. 45 mL 3  metFORMIN (GLUCOPHAGE) 1000 MG tablet TAKE 1 TABLET BY MOUTH TWICE A DAY WITH FOOD 180 tablet 0   sildenafil (VIAGRA) 100 MG tablet TAKE 1 TABLET BY MOUTH EVERY DAY AS NEEDED 10 tablet 5   SYRINGE-NEEDLE, DISP, 3 ML (B-D INTEGRA SYRINGE) 25G X 5/8" 3 ML MISC USE AS DIRECTED 12 each 24   Current Facility-Administered Medications  Medication Dose Route Frequency Provider Last  Rate Last Admin   cyanocobalamin ((VITAMIN B-12)) injection 1,000 mcg  1,000 mcg Intramuscular Once [name], MD        SUMMARY OF ONCOLOGIC HISTORY: Oncology History Overview Note  Normal cytogenetics   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 No lytic or destructive lesions within the bones. 2. 2.9 cm nodular opacity in the hilar region on the left, unchanged from recent CT. 3. Scattered degenerative changes.   06/02/2022 Bone Marrow Biopsy   BONE MARROW, ASPIRATE, CLOT, CORE: -Variably cellular bone marrow involved by plasma cell neoplasm (10% plasma cells by manual aspirate differential, approximately 10 to 15% by CD138 immunohistochemical stains on clot and core sections, and kappa restricted by kappa/lambda in situ hybridization.).  See comment.  PERIPHERAL BLOOD: -Normocytic normochromic anemia  COMMENT:   Morphological evaluation of the bone marrow clot sections reveals multiple lymphoid aggregates in addition to increased plasma cells.  CD3 and CD20 stains are attempted, however the lymphoid aggregates are lost on deeper levels with a single lymphoid aggregate showing slightly more CD20 staining than CD3.  While flow cytometry is negative for a clonal B-cell population, close clinical and imaging follow-up is recommended along with assessment of peripheral blood and potential sites of lymphadenopathy/organomegaly as clinically indicated.   MICROSCOPIC DESCRIPTION:  PERIPHERAL BLOOD SMEAR: Platelets: Adequate, no platelet clumps identified Erythroid: Normocytic normochromic anemia Leukocytes: Adequate, negative for dysplastic granulocytes, blasts or plasma cells.  Plasmacytoid lymphocytes present.  BONE MARROW ASPIRATE: Cellular Erythroid precursors: Erythroid precursors show a full sequence of generally orderly maturation Granulocytic precursors: Granulocytic precursors show full sequence of generally orderly maturation Megakaryocytes: Typical in number and  morphology Lymphocytes/plasma cells: Plasma cells are increased  TOUCH PREPARATIONS: Confirmatory of the aspirate findings  CLOT AND BIOPSY: The bone marrow clot and biopsy reveal a variably cellular bone marrow (20 to 60%) with trilineage hematopoiesis.  Plasma cells are increased and present singly scattered and in clusters.  Also present are loosely circumscribed lymphoid aggregates with small mature lymphocytes.  The findings are confirmatory of the aspirate and touch prep impression.  SPECIAL stains: CD138: CD138 highlights plasma cells, approximately 10% of cellularity in clot and core sections Kappa/lambda ISH: Kappa/lambda ISH shows kappa restricted plasma cells CD3: CD3 highlights T lymphocytes in the lymphoid aggregates CD20: CD20 highlights B lymphocytes in lymphoid aggregates (slightly more than CD3) CD56: CD56 is positive in the plasma cells MUM 1: MUM1 is positive in the plasma cells IRON STAIN: Iron stains are performed on a bone marrow aspirate or touch imprint smear and section of clot. The controls stained appropriately.       Storage Iron: Scant      Ring Sideroblasts: Not identified  ADDITIONAL DATA/TESTING: Multiple myeloma panel  CELL COUNT DATA:  Bone Marrow count performed on 500 cells shows: Blasts:   0%   Myeloid:  43% Promyelocytes: 0%   Erythroid:     37% Myelocytes:    5%   Lymphocytes:   7% Metamyelocytes:     2%   Plasma cells:  10% Bands:    4% Neutrophils:  30%  M:E ratio:     1.16 Eosinophils:   2% Basophils:     0% Monocytes:     3%      PHYSICAL EXAMINATION: ECOG PERFORMANCE STATUS: 0 - Asymptomatic  Vitals:   12/16/22 1353  BP: (!) 140/71  Pulse: 73  Resp: 18  Temp: 98.1 F (36.7 C)  SpO2: 100%   Filed Weights   12/16/22 1353  Weight: 247 lb 12.8 oz (112.4 kg)    GENERAL:alert, no distress and comfortable NEURO: alert & oriented x 3 with fluent speech, no focal motor/sensory deficits  LABORATORY DATA:  I have reviewed  the data as listed    Component Value Date/Time   NA 136 11/18/2022 1009   NA 137 11/05/2022 1152   K 4.2 11/18/2022 1009   CL 105 11/18/2022 1009   CO2 26 11/18/2022 1009   GLUCOSE 152 (H) 11/18/2022 1009   BUN 14 11/18/2022 1009   BUN 11 11/05/2022 1152   CREATININE 1.34 (H) 11/18/2022 1009   CREATININE 1.14 08/19/2022 1314   CALCIUM 9.2 11/18/2022 1009   PROT 8.0 11/18/2022 1009   PROT 7.7 08/18/2022 1518   ALBUMIN 4.0 11/18/2022 1009   ALBUMIN 4.3 08/18/2022 1518   AST 26 11/18/2022 1009   AST 22 08/19/2022 1314   ALT 20 11/18/2022 1009   ALT 16 08/19/2022 1314   ALKPHOS 78 11/18/2022 1009   BILITOT 0.4 11/18/2022 1009   BILITOT 0.5 08/19/2022 1314   GFRNONAA 58 (L) 11/18/2022 1009   GFRNONAA >60 08/19/2022 1314   GFRAA 70 08/06/2020 1044    No results found for: "SPEP", "UPEP"  Lab Results  Component Value Date   WBC 5.3 11/18/2022   NEUTROABS 2.7 11/18/2022   HGB 11.2 (L) 11/18/2022   HCT 33.6 (L) 11/18/2022   MCV 85.7 11/18/2022   PLT 186 11/18/2022      Chemistry      Component Value Date/Time   NA 136 11/18/2022 1009   NA 137 11/05/2022 1152   K 4.2 11/18/2022 1009   CL 105 11/18/2022 1009   CO2 26 11/18/2022 1009   BUN 14 11/18/2022 1009   BUN 11 11/05/2022 1152   CREATININE 1.34 (H) 11/18/2022 1009   CREATININE 1.14 08/19/2022 1314      Component Value Date/Time   CALCIUM 9.2 11/18/2022 1009   ALKPHOS 78 11/18/2022 1009   AST 26 11/18/2022 1009   AST 22 08/19/2022 1314   ALT 20 11/18/2022 1009   ALT 16 08/19/2022 1314   BILITOT 0.4 11/18/2022 1009   BILITOT 0.5 08/19/2022 1314       RADIOGRAPHIC STUDIES: I have reviewed multiple imaging studies with the patient I have personally reviewed the radiological images as listed and agreed with the findings in the report. NM PET Image Initial (PI) Skull Base To Thigh  Result Date: 12/14/2022 CLINICAL DATA:  Initial treatment strategy for left upper lobe pulmonary nodule. EXAM: NUCLEAR  MEDICINE PET SKULL BASE TO THIGH TECHNIQUE: 13.0 mCi F-18 FDG was injected intravenously. Full-ring PET imaging was performed from the skull base to thigh after the radiotracer. CT data was obtained and used for attenuation correction and anatomic localization. Fasting blood glucose: 78 mg/dl COMPARISON:  CT chest dated 11/24/2022 FINDINGS: Mediastinal blood pool activity: SUV max 2.8 Liver activity: SUV max NA NECK: No hypermetabolic cervical lymphadenopathy. Incidental CT findings: None. CHEST: 4.0 x 2.4 cm mixed density nodule in the left upper lobe/lingula (series 8/image 89). When correlating with prior studies,  there is a stable band like solid component anteriorly but a new mixed density/ground-glass component posteriorly. 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 Branching/tubular opacity in the posterior left lower lobe (series 8/image 84), chronic, non FDG avid. Focal hypermetabolism in the superior segment left lower lobe (PET image 61), max SUV 9.1, but without associated pulmonary nodule on CT. Incidental CT findings: None. ABDOMEN/PELVIS: 1.6 cm left adrenal nodule (series 4/image 96), previously characterized as a benign adrenal adenoma, max SUV 5.0. No abnormal hypermetabolism in the liver, pancreas, spleen, or right adrenal gland. No hypermetabolic abdominopelvic lymphadenopathy. Incidental CT findings: Mild hepatic steatosis. SKELETON: No focal hypermetabolic activity to suggest skeletal metastasis. Incidental CT findings: Degenerative changes of the visualized thoracolumbar spine. 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 Additional focal  hypermetabolism in the superior segment left lower lobe without corresponding nodule. Attention to this region on follow-up is suggested. No findings suspicious for metastatic disease. Benign left adrenal adenoma. Electronically Signed   By: Charline Bills M.D.   On: 12/14/2022 01:02   CT CHEST WO CONTRAST  Result Date: 11/27/2022 CLINICAL DATA:  Multiple pulmonary nodules EXAM: CT CHEST WITHOUT CONTRAST TECHNIQUE: Multidetector CT imaging of the chest was performed following the standard protocol without IV contrast. RADIATION DOSE REDUCTION: This exam was performed according to the departmental dose-optimization program which includes automated exposure control, adjustment of the mA and/or kV according to patient size and/or use of iterative reconstruction technique. COMPARISON:  Multiple priors, most recent chest CT dated November 22, 2020 FINDINGS: Cardiovascular: Normal heart size. No pericardial effusion. Normal caliber thoracic aorta with mild calcified plaque. Mild coronary artery calcifications. Mediastinum/Nodes: Esophagus and thyroid are unremarkable. No enlarged lymph nodes seen in the chest. Lungs/Pleura: Central airways are patent. Increased size of solid nodule of the left upper lobe with adjacent ground-glass measuring 3.8 x 2.4 cm in overall diameter with 3.3 x 1.7 cm solid component on series 8, image 88, previously 3.0 x 1.4 cm overall diameter. 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 Stable small ground-glass nodule of the left lower lobe measuring 5 mm on series 8, image 125. Stable ground-glass nodule of the left lower lobe measuring 7 mm on image 84. No pleural effusion or pneumothorax. Upper Abdomen: Stable left adrenal gland adenoma, no specific follow-up imaging is needed for this lesion. No acute abnormality. Musculoskeletal: No chest wall mass or suspicious bone lesions identified. IMPRESSION: 1. Increased size  of solid nodule of the left upper lobe with adjacent ground-glass, highly concerning for primary lung adenocarcinoma. 2. Stable small ground-glass nodules of the left lower lobe. Recommend attention on follow-up. 3. 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 4. Aortic Atherosclerosis (ICD10-I70.0). Electronically Signed   By: Allegra Lai M.D.   On: 11/27/2022 15:46

## 2022-12-16 NOTE — Assessment & Plan Note (Signed)
I have reviewed multiple imaging studies with the patient and his wife He is currently not symptomatic PET/CT imaging reviewed hypermetabolic activity on the left upper lobe, concerning for primary lung cancer There is a separate site of hypermetabolic activity on the same lobe but without any correlate of lesion The cause is unknown He is scheduled to see pulmonologist next month I will reach out to the pulmonologist to see if we can expedite his appointment He will likely need bronchoscopy and biopsy to establish diagnosis before referral to CT surgeon I addressed all his questions and concerns

## 2022-12-17 ENCOUNTER — Telehealth: Payer: Self-pay | Admitting: *Deleted

## 2022-12-17 NOTE — Telephone Encounter (Signed)
Left message for pt at both home and mobile number to call to give appt date with Kandice Robinsons, NP for 12/24/22. This has already been scheduled pt just needs to be made aware. Once pt confirms appt then ok to cancel his appt w/ [facility] on 7/17.

## 2022-12-18 NOTE — Telephone Encounter (Signed)
Spoke with patient by phone.  Appt 01/07/23 cancelled and new appt 12/24/22 confirmed with patient

## 2022-12-22 NOTE — H&P (View-Only) (Signed)
History of Present Illness William Mcguire is a 67 y.o. male never smoker with PET/CT imaging confirming hypermetabolic activity on the left upper lobe, concerning for primary lung cancer . Pt was referred to Dr. Tonia Brooms for further evaluation and consideration of a bronchoscopy with biopsies.  PMH includes Diabetes, HTN, Chronic renal disease ( Stage II) Obesity and smoldering Myeloma.    12/23/2022 Pt. Presents for consult for hypermetabolic pulmonary nodules that have been followed since 2022, but have recently shown  some growth on most recent CT Chest 11/2022. Follow up PET scan showed nodules were hypermetabolic. He has been referred for consideration of bronchoscopy with biopsy. Pt. States he is in agreement with  biopsy to determine tissue diagnosis. We will get the patient scheduled with either Dr. Delton Coombes or [facility] for procedure as soon as possible. We discussed that he will need to hold his aspirin the day before procedure. He is not on any other  blood thinner . He is on  Dulaglutide which he will need to hold the Sunday before the procedure. He has verbalized understanding of this.   He has no shortness of breath. Secretions are clear. He has not had weight loss. No hemoptysis. He is a never smoker, he gets second hand smoke exposure at work. They do not have a basement , he did not grow up in a home with a basement.   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 Additional focal hypermetabolism in the superior segment left lower lobe without corresponding nodule. Attention to this region on follow-up is suggested. No findings suspicious for metastatic disease. Benign left adrenal adenoma.    CT Chest 11/24/2022 Increased size of solid nodule of the left upper lobe with adjacent  ground-glass, highly concerning for primary lung adenocarcinoma. 2. Stable small ground-glass nodules of the left lower lobe. Recommend attention on follow-up. 3. 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 4. Aortic Atherosclerosis (ICD10-I70.0).     Latest Ref Rng & Units 11/18/2022   10:09 AM 08/19/2022    1:14 PM 06/02/2022    7:04 AM  CBC  WBC 4.0 - 10.5 K/uL 5.3  5.8  4.7   Hemoglobin 13.0 - 17.0 g/dL 16.1  09.6  04.5   Hematocrit 39.0 - 52.0 % 33.6  37.0  34.2   Platelets 150 - 400 K/uL 186  190  205        Latest Ref Rng & Units 11/18/2022   10:09 AM 11/05/2022   11:52 AM 08/19/2022    1:14 PM  BMP  Glucose 70 - 99 mg/dL 409  811  77   BUN 8 - 23 mg/dL 14  11  10    Creatinine 0.61 - 1.24 mg/dL 9.14  7.82  9.56   BUN/Creat Ratio 10 - 24  9    Sodium 135 - 145 mmol/L 136  137  138   Potassium 3.5 - 5.1 mmol/L 4.2  4.7  4.1   Chloride 98 - 111 mmol/L 105  100  104   CO2 22 - 32 mmol/L 26  26  29    Calcium 8.9 - 10.3 mg/dL 9.2  9.5  9.1     BNP No results found for: "BNP"  ProBNP No results found for: "PROBNP"  PFT No results found for: "FEV1PRE", "FEV1POST", "FVCPRE", "FVCPOST", "TLC", "DLCOUNC", "  PREFEV1FVCRT", "PSTFEV1FVCRT"  NM PET Image Initial (PI) Skull Base To Thigh  Result Date: 12/14/2022 CLINICAL DATA:  Initial treatment strategy for left upper lobe pulmonary nodule. EXAM: NUCLEAR MEDICINE PET SKULL BASE TO THIGH TECHNIQUE: 13.0 mCi F-18 FDG was injected intravenously. Full-ring PET imaging was performed from the skull base to thigh after the radiotracer. CT data was obtained and used for attenuation correction and anatomic localization. Fasting blood glucose: 78 mg/dl COMPARISON:  CT chest dated 11/24/2022 FINDINGS: Mediastinal blood pool activity: SUV max 2.8 Liver activity: SUV max NA NECK: No hypermetabolic cervical lymphadenopathy. Incidental CT findings: None. CHEST: 4.0 x 2.4 cm mixed density nodule in the left upper  lobe/lingula (series 8/image 89). When correlating with prior studies, there is a stable band like solid component anteriorly but a new mixed density/ground-glass component posteriorly. 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 Branching/tubular opacity in the posterior left lower lobe (series 8/image 84), chronic, non FDG avid. Focal hypermetabolism in the superior segment left lower lobe (PET image 61), max SUV 9.1, but without associated pulmonary nodule on CT. Incidental CT findings: None. ABDOMEN/PELVIS: 1.6 cm left adrenal nodule (series 4/image 96), previously characterized as a benign adrenal adenoma, max SUV 5.0. No abnormal hypermetabolism in the liver, pancreas, spleen, or right adrenal gland. No hypermetabolic abdominopelvic lymphadenopathy. Incidental CT findings: Mild hepatic steatosis. SKELETON: No focal hypermetabolic activity to suggest skeletal metastasis. Incidental CT findings: Degenerative changes of the visualized thoracolumbar spine. 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 Additional focal hypermetabolism in the superior segment left lower lobe without corresponding nodule. Attention to this region on follow-up is suggested. No findings suspicious for metastatic disease. Benign left adrenal adenoma. Electronically Signed   By: Charline Bills M.D.   On: 12/14/2022 01:02   CT CHEST WO CONTRAST  Result Date: 11/27/2022 CLINICAL DATA:  Multiple pulmonary nodules EXAM: CT CHEST WITHOUT CONTRAST TECHNIQUE: Multidetector CT imaging of the chest was performed following the standard protocol without IV contrast. RADIATION DOSE REDUCTION:  This exam was performed according to the departmental dose-optimization program which includes automated exposure control, adjustment of the mA and/or kV according to patient size and/or use of iterative reconstruction technique. COMPARISON:  Multiple priors, most recent chest CT dated November 22, 2020 FINDINGS: Cardiovascular: Normal heart size. No pericardial effusion. Normal caliber thoracic aorta with mild calcified plaque. Mild coronary artery calcifications. Mediastinum/Nodes: Esophagus and thyroid are unremarkable. No enlarged lymph nodes seen in the chest. Lungs/Pleura: Central airways are patent. Increased size of solid nodule of the left upper lobe with adjacent ground-glass measuring 3.8 x 2.4 cm in overall diameter with 3.3 x 1.7 cm solid component on series 8, image 88, previously 3.0 x 1.4 cm overall diameter. 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 Stable small ground-glass nodule of the left lower lobe measuring 5 mm on series 8, image 125. Stable ground-glass nodule of the left lower lobe measuring 7 mm on image 84. No pleural effusion or pneumothorax. Upper Abdomen: Stable left adrenal gland adenoma, no specific follow-up imaging is needed for this lesion. No acute abnormality. Musculoskeletal: No chest wall mass or suspicious bone lesions identified. IMPRESSION: 1. Increased size of solid nodule of the  left upper lobe with adjacent ground-glass, highly concerning for primary lung adenocarcinoma. 2. Stable small ground-glass nodules of the left lower lobe. Recommend attention on follow-up. 3. 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 4. Aortic Atherosclerosis (ICD10-I70.0). Electronically Signed   By: Allegra Lai M.D.   On: 11/27/2022 15:46     Past medical hx Past Medical History:  Diagnosis Date   Diabetes mellitus    High cholesterol    Hypertension      Social History    Tobacco Use   Smoking status: Never   Smokeless tobacco: Never   Tobacco comments:    n/a  Vaping Use   Vaping Use: Never used  Substance Use Topics   Alcohol use: No   Drug use: No    Mr.Erker reports that he has never smoked. He has never used smokeless tobacco. He reports that he does not drink alcohol and does not use drugs.  Tobacco Cessation: Never smoker Past surgical hx, Family hx, Social hx all reviewed.  Current Outpatient Medications on File Prior to Visit  Medication Sig   amLODipine (NORVASC) 10 MG tablet TAKE 1 TABLET BY MOUTH EVERY DAY   aspirin EC 81 MG tablet Take 81 mg by mouth daily.   atorvastatin (LIPITOR) 40 MG tablet TAKE 1 TABLET BY MOUTH EVERY DAY   B-D ULTRAFINE III SHORT PEN 31G X 8 MM MISC USE AS DIRECTED   cyanocobalamin (VITAMIN B12) 1000 MCG/ML injection INJECT 1 ML (1,000 MCG TOTAL) INTO THE MUSCLE EVERY 30 DAYS.   Dulaglutide 1.5 MG/0.5ML SOPN Inject 1.5 mg into the skin once a week.   enalapril (VASOTEC) 10 MG tablet TAKE 1 TABLET BY MOUTH EVERY DAY   glucose blood (ONETOUCH VERIO) test strip Use as instructed to check blood sugars twice daily E11.69   glucose blood test strip Use as instructed to test blood sugar 3 times a day. 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   metFORMIN (GLUCOPHAGE) 1000 MG tablet TAKE 1 TABLET BY MOUTH TWICE A DAY WITH FOOD   sildenafil (VIAGRA) 100 MG tablet TAKE 1 TABLET BY MOUTH EVERY DAY AS NEEDED   SYRINGE-NEEDLE, DISP, 3 ML (B-D INTEGRA SYRINGE) 25G X 5/8" 3 ML MISC USE AS DIRECTED   Current Facility-Administered Medications on File Prior to Visit  Medication   cyanocobalamin ((VITAMIN B-12)) injection 1,000 mcg     No Known Allergies  Review Of Systems:  Constitutional:   No  weight loss, night sweats,  Fevers, chills, fatigue, or  lassitude.  HEENT:   No headaches,  Difficulty swallowing,  Tooth/dental problems,  or  Sore throat,                No sneezing, itching, ear ache, nasal congestion, post nasal drip,   CV:  No chest pain,  Orthopnea, PND, swelling in lower extremities, anasarca, dizziness, palpitations, syncope.   GI  No heartburn, indigestion, abdominal pain, nausea, vomiting, diarrhea, change in bowel habits, loss of appetite, bloody stools.   Resp: No shortness of breath with exertion or at rest.  No excess mucus, no productive cough,  No non-productive cough,  No coughing up of blood.  No change in color of mucus.  No wheezing.  No chest wall deformity  Skin: no rash or lesions.  GU: no dysuria, change in color of urine, no urgency or frequency.  No flank pain,  no hematuria   MS:  No joint pain or swelling.  No decreased range of motion.  No back pain.  Psych:  No change in mood or affect. No depression or anxiety.  No memory loss.   Vital Signs BP 120/70   Pulse 73   Temp 98.4 F (36.9 C)   Ht 6\' 3"  (1.905 m)   Wt 245 lb (111.1 kg)   SpO2 98%   BMI 30.62 kg/m    Physical Exam:  General- No distress,  A&Ox3, pleasant, appropriately concerned. ENT: No sinus tenderness, TM clear, pale nasal mucosa, no oral exudate,no post nasal drip, no LAN Cardiac: S1, S2, regular rate and rhythm, no murmur Chest: No wheeze/ rales/ dullness; no accessory muscle use, no nasal flaring, no sternal retractions Abd.: Soft Non-tender, ND, BS +, Body mass index is 30.62 kg/m.  Ext: No clubbing cyanosis, edema, no obvious deformities Neuro:  normal strength, MAE x 4, A&O x 3 Skin: No rashes, warm and dry,no lesions  Psych: normal mood and behavior   Assessment/Plan Mixed density nodule in the left upper lobe / hypermetabolic on PET Never smoker  Plan It is good to see you today. We have scheduled you for a bronchoscopy with biopsies. This will be on [facility]/05/2023 at [facility]. This will be done under general anesthesia as we discussed.  You will need to have a family member with you  the entire time at the hospital and for 24 hours after the procedure.  Follow up one week after the procedure with Maralyn Sago NP or Dr. Tonia Brooms. Do not take your baby ASA 7/14. Do not take Dulaglutide 7/14. You will need to be nothing by mouth after midnight , if this is an afternoon procedure this may change a bit.  Call if you have any questions . Please contact office for sooner follow up if symptoms do not improve or worsen or seek emergency care    I spent 40 minutes dedicated to the care of this patient on the date of this encounter to include pre-visit review of records, face-to-face time with the patient discussing conditions above, post visit ordering of testing, clinical documentation with the electronic health record, making appropriate referrals as documented, and communicating necessary information to the patient's healthcare team.    Bevelyn Ngo, NP 12/23/2022  12:59 PM

## 2022-12-22 NOTE — Progress Notes (Unsigned)
History of Present Illness William Mcguire is a 67 y.o. male never smoker with PET/CT imaging confirming hypermetabolic activity on the left upper lobe, concerning for primary lung cancer . Pt was referred to Dr. Tonia Brooms for further evaluation and consideration of a bronchoscopy with biopsies.  PMH includes Diabetes, HTN, Chronic renal disease ( Stage II) Obesity and smoldering Myeloma.    12/23/2022 Pt. Presents for consult for hypermetabolic pulmonary nodules that have been followed since 2022, but have recently shown  some growth on most recent CT Chest 11/2022. Follow up PET scan showed nodules were hypermetabolic. He has been referred for consideration of bronchoscopy with biopsy. Pt. States he is in agreement with  biopsy to determine tissue diagnosis. We will get the patient scheduled with either Dr. Delton Coombes or [facility] for procedure as soon as possible. We discussed that he will need to hold his aspirin the day before procedure. He is not on any other  blood thinner . He is on  Dulaglutide which he will need to hold the Sunday before the procedure. He has verbalized understanding of this.   He has no shortness of breath. Secretions are clear. He has not had weight loss. No hemoptysis. He is a never smoker, he gets second hand smoke exposure at work. They do not have a basement , he did not grow up in a home with a basement.   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 Additional focal hypermetabolism in the superior segment left lower lobe without corresponding nodule. Attention to this region on follow-up is suggested. No findings suspicious for metastatic disease. Benign left adrenal adenoma.    CT Chest 11/24/2022 Increased size of solid nodule of the left upper lobe with adjacent  ground-glass, highly concerning for primary lung adenocarcinoma. 2. Stable small ground-glass nodules of the left lower lobe. Recommend attention on follow-up. 3. 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 4. Aortic Atherosclerosis (ICD10-I70.0).     Latest Ref Rng & Units 11/18/2022   10:09 AM 08/19/2022    1:14 PM 06/02/2022    7:04 AM  CBC  WBC 4.0 - 10.5 K/uL 5.3  5.8  4.7   Hemoglobin 13.0 - 17.0 g/dL 16.1  09.6  04.5   Hematocrit 39.0 - 52.0 % 33.6  37.0  34.2   Platelets 150 - 400 K/uL 186  190  205        Latest Ref Rng & Units 11/18/2022   10:09 AM 11/05/2022   11:52 AM 08/19/2022    1:14 PM  BMP  Glucose 70 - 99 mg/dL 409  811  77   BUN 8 - 23 mg/dL 14  11  10    Creatinine 0.61 - 1.24 mg/dL 9.14  7.82  9.56   BUN/Creat Ratio 10 - 24  9    Sodium 135 - 145 mmol/L 136  137  138   Potassium 3.5 - 5.1 mmol/L 4.2  4.7  4.1   Chloride 98 - 111 mmol/L 105  100  104   CO2 22 - 32 mmol/L 26  26  29    Calcium 8.9 - 10.3 mg/dL 9.2  9.5  9.1     BNP No results found for: "BNP"  ProBNP No results found for: "PROBNP"  PFT No results found for: "FEV1PRE", "FEV1POST", "FVCPRE", "FVCPOST", "TLC", "DLCOUNC", "  PREFEV1FVCRT", "PSTFEV1FVCRT"  NM PET Image Initial (PI) Skull Base To Thigh  Result Date: 12/14/2022 CLINICAL DATA:  Initial treatment strategy for left upper lobe pulmonary nodule. EXAM: NUCLEAR MEDICINE PET SKULL BASE TO THIGH TECHNIQUE: 13.0 mCi F-18 FDG was injected intravenously. Full-ring PET imaging was performed from the skull base to thigh after the radiotracer. CT data was obtained and used for attenuation correction and anatomic localization. Fasting blood glucose: 78 mg/dl COMPARISON:  CT chest dated 11/24/2022 FINDINGS: Mediastinal blood pool activity: SUV max 2.8 Liver activity: SUV max NA NECK: No hypermetabolic cervical lymphadenopathy. Incidental CT findings: None. CHEST: 4.0 x 2.4 cm mixed density nodule in the left upper  lobe/lingula (series 8/image 89). When correlating with prior studies, there is a stable band like solid component anteriorly but a new mixed density/ground-glass component posteriorly. 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 Branching/tubular opacity in the posterior left lower lobe (series 8/image 84), chronic, non FDG avid. Focal hypermetabolism in the superior segment left lower lobe (PET image 61), max SUV 9.1, but without associated pulmonary nodule on CT. Incidental CT findings: None. ABDOMEN/PELVIS: 1.6 cm left adrenal nodule (series 4/image 96), previously characterized as a benign adrenal adenoma, max SUV 5.0. No abnormal hypermetabolism in the liver, pancreas, spleen, or right adrenal gland. No hypermetabolic abdominopelvic lymphadenopathy. Incidental CT findings: Mild hepatic steatosis. SKELETON: No focal hypermetabolic activity to suggest skeletal metastasis. Incidental CT findings: Degenerative changes of the visualized thoracolumbar spine. 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 Additional focal hypermetabolism in the superior segment left lower lobe without corresponding nodule. Attention to this region on follow-up is suggested. No findings suspicious for metastatic disease. Benign left adrenal adenoma. Electronically Signed   By: Charline Bills M.D.   On: 12/14/2022 01:02   CT CHEST WO CONTRAST  Result Date: 11/27/2022 CLINICAL DATA:  Multiple pulmonary nodules EXAM: CT CHEST WITHOUT CONTRAST TECHNIQUE: Multidetector CT imaging of the chest was performed following the standard protocol without IV contrast. RADIATION DOSE REDUCTION:  This exam was performed according to the departmental dose-optimization program which includes automated exposure control, adjustment of the mA and/or kV according to patient size and/or use of iterative reconstruction technique. COMPARISON:  Multiple priors, most recent chest CT dated November 22, 2020 FINDINGS: Cardiovascular: Normal heart size. No pericardial effusion. Normal caliber thoracic aorta with mild calcified plaque. Mild coronary artery calcifications. Mediastinum/Nodes: Esophagus and thyroid are unremarkable. No enlarged lymph nodes seen in the chest. Lungs/Pleura: Central airways are patent. Increased size of solid nodule of the left upper lobe with adjacent ground-glass measuring 3.8 x 2.4 cm in overall diameter with 3.3 x 1.7 cm solid component on series 8, image 88, previously 3.0 x 1.4 cm overall diameter. 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 Stable small ground-glass nodule of the left lower lobe measuring 5 mm on series 8, image 125. Stable ground-glass nodule of the left lower lobe measuring 7 mm on image 84. No pleural effusion or pneumothorax. Upper Abdomen: Stable left adrenal gland adenoma, no specific follow-up imaging is needed for this lesion. No acute abnormality. Musculoskeletal: No chest wall mass or suspicious bone lesions identified. IMPRESSION: 1. Increased size of solid nodule of the  left upper lobe with adjacent ground-glass, highly concerning for primary lung adenocarcinoma. 2. Stable small ground-glass nodules of the left lower lobe. Recommend attention on follow-up. 3. 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 4. Aortic Atherosclerosis (ICD10-I70.0). Electronically Signed   By: Allegra Lai M.D.   On: 11/27/2022 15:46     Past medical hx Past Medical History:  Diagnosis Date   Diabetes mellitus    High cholesterol    Hypertension      Social History    Tobacco Use   Smoking status: Never   Smokeless tobacco: Never   Tobacco comments:    n/a  Vaping Use   Vaping Use: Never used  Substance Use Topics   Alcohol use: No   Drug use: No    Mr.Hider reports that he has never smoked. He has never used smokeless tobacco. He reports that he does not drink alcohol and does not use drugs.  Tobacco Cessation: Never smoker Past surgical hx, Family hx, Social hx all reviewed.  Current Outpatient Medications on File Prior to Visit  Medication Sig   amLODipine (NORVASC) 10 MG tablet TAKE 1 TABLET BY MOUTH EVERY DAY   aspirin EC 81 MG tablet Take 81 mg by mouth daily.   atorvastatin (LIPITOR) 40 MG tablet TAKE 1 TABLET BY MOUTH EVERY DAY   B-D ULTRAFINE III SHORT PEN 31G X 8 MM MISC USE AS DIRECTED   cyanocobalamin (VITAMIN B12) 1000 MCG/ML injection INJECT 1 ML (1,000 MCG TOTAL) INTO THE MUSCLE EVERY 30 DAYS.   Dulaglutide 1.5 MG/0.5ML SOPN Inject 1.5 mg into the skin once a week.   enalapril (VASOTEC) 10 MG tablet TAKE 1 TABLET BY MOUTH EVERY DAY   glucose blood (ONETOUCH VERIO) test strip Use as instructed to check blood sugars twice daily E11.69   glucose blood test strip Use as instructed to test blood sugar 3 times a day. 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   metFORMIN (GLUCOPHAGE) 1000 MG tablet TAKE 1 TABLET BY MOUTH TWICE A DAY WITH FOOD   sildenafil (VIAGRA) 100 MG tablet TAKE 1 TABLET BY MOUTH EVERY DAY AS NEEDED   SYRINGE-NEEDLE, DISP, 3 ML (B-D INTEGRA SYRINGE) 25G X 5/8" 3 ML MISC USE AS DIRECTED   Current Facility-Administered Medications on File Prior to Visit  Medication   cyanocobalamin ((VITAMIN B-12)) injection 1,000 mcg     No Known Allergies  Review Of Systems:  Constitutional:   No  weight loss, night sweats,  Fevers, chills, fatigue, or  lassitude.  HEENT:   No headaches,  Difficulty swallowing,  Tooth/dental problems,  or  Sore throat,                No sneezing, itching, ear ache, nasal congestion, post nasal drip,   CV:  No chest pain,  Orthopnea, PND, swelling in lower extremities, anasarca, dizziness, palpitations, syncope.   GI  No heartburn, indigestion, abdominal pain, nausea, vomiting, diarrhea, change in bowel habits, loss of appetite, bloody stools.   Resp: No shortness of breath with exertion or at rest.  No excess mucus, no productive cough,  No non-productive cough,  No coughing up of blood.  No change in color of mucus.  No wheezing.  No chest wall deformity  Skin: no rash or lesions.  GU: no dysuria, change in color of urine, no urgency or frequency.  No flank pain,  no hematuria   MS:  No joint pain or swelling.  No decreased range of motion.  No back pain.  Psych:  No change in mood or affect. No depression or anxiety.  No memory loss.   Vital Signs BP 120/70   Pulse 73   Temp 98.4 F (36.9 C)   Ht 6\' 3"  (1.905 m)   Wt 245 lb (111.1 kg)   SpO2 98%   BMI 30.62 kg/m    Physical Exam:  General- No distress,  A&Ox3, pleasant, appropriately concerned. ENT: No sinus tenderness, TM clear, pale nasal mucosa, no oral exudate,no post nasal drip, no LAN Cardiac: S1, S2, regular rate and rhythm, no murmur Chest: No wheeze/ rales/ dullness; no accessory muscle use, no nasal flaring, no sternal retractions Abd.: Soft Non-tender, ND, BS +, Body mass index is 30.62 kg/m.  Ext: No clubbing cyanosis, edema, no obvious deformities Neuro:  normal strength, MAE x 4, A&O x 3 Skin: No rashes, warm and dry,no lesions  Psych: normal mood and behavior   Assessment/Plan Mixed density nodule in the left upper lobe / hypermetabolic on PET Never smoker  Plan It is good to see you today. We have scheduled you for a bronchoscopy with biopsies. This will be on 01/05/2023 at [facility]. This will be done under general anesthesia as we discussed.  You will need to have a family member with you  the entire time at the hospital and for 24 hours after the procedure.  Follow up one week after the procedure with Maralyn Sago NP or Dr. Tonia Brooms. Do not take your baby ASA 7/14. Do not take Dulaglutide 7/14. You will need to be nothing by mouth after midnight , if this is an afternoon procedure this may change a bit.  Call if you have any questions . Please contact office for sooner follow up if symptoms do not improve or worsen or seek emergency care    I spent 40 minutes dedicated to the care of this patient on the date of this encounter to include pre-visit review of records, face-to-face time with the patient discussing conditions above, post visit ordering of testing, clinical documentation with the electronic health record, making appropriate referrals as documented, and communicating necessary information to the patient's healthcare team.    Bevelyn Ngo, NP 12/23/2022  12:59 PM

## 2022-12-23 ENCOUNTER — Encounter: Payer: Self-pay | Admitting: Pulmonary Disease

## 2022-12-23 ENCOUNTER — Encounter: Payer: Self-pay | Admitting: Acute Care

## 2022-12-23 ENCOUNTER — Ambulatory Visit: Payer: Medicare Other | Admitting: Acute Care

## 2022-12-23 VITALS — BP 120/70 | HR 73 | Temp 98.4°F | Ht 75.0 in | Wt 245.0 lb

## 2022-12-23 DIAGNOSIS — R918 Other nonspecific abnormal finding of lung field: Secondary | ICD-10-CM

## 2022-12-23 NOTE — Patient Instructions (Addendum)
It is good to see you today. We have scheduled you for a bronchoscopy with biopsies. This will be on 01/05/2023 at [facility]. This will be done under general anesthesia as we discussed.  You will need to have a family member with you the entire time at the hospital and for 24 hours after the procedure.  Follow up one week after the procedure with Maralyn Sago NP or Dr. Tonia Brooms. Do not take your baby ASA 7/14. Do not take Dulaglutide 7/14. You will need to be nothing by mouth after midnight , if this is an afternoon procedure this may change a bit.  Call if you have any questions . Please contact office for sooner follow up if symptoms do not improve or worsen or seek emergency care

## 2022-12-24 ENCOUNTER — Ambulatory Visit: Payer: Medicare Other | Admitting: Acute Care

## 2022-12-26 ENCOUNTER — Telehealth: Payer: Self-pay

## 2022-12-26 NOTE — Telephone Encounter (Signed)
-----   Message from Artis Delay, MD sent at 12/26/2022  8:32 AM EDT ----- Looks like his biopsy is on 7/15. I can see him at 7/19 at 1 pm, 45 mins to discuss. Please help me schedule

## 2022-12-26 NOTE — Telephone Encounter (Signed)
Called and scheduled appt on 7/19 at 1 pm. He is aware of appt.

## 2023-01-02 ENCOUNTER — Encounter (HOSPITAL_COMMUNITY): Payer: Self-pay | Admitting: Pulmonary Disease

## 2023-01-02 ENCOUNTER — Other Ambulatory Visit: Payer: Self-pay

## 2023-01-02 NOTE — Progress Notes (Addendum)
SDW CALL  Patient was given pre-op instructions over the phone. The opportunity was given for the patient to ask questions. No further questions asked. Patient verbalized understanding of instructions given.   PCP - Emilio Math Cardiologist - denies  PPM/ICD - denies Device Orders -  Rep Notified -   Chest x-ray - 11/27/22 EKG - 08/18/22 Stress Test - none ECHO - none Cardiac Cath - none  Sleep Study - none CPAP - no  Fasting Blood Sugar - 120 Checks Blood Sugar ONE times a day  Blood Thinner Instructions:no Aspirin Instructions: hold DOS  ERAS Protcol -no PRE-SURGERY Ensure or G2- no  COVID TEST- no   Anesthesia review: no  Patient denies shortness of breath, fever, cough and chest pain over the phone call  Surgical Instructions    Your procedure is scheduled on Monday July 15.  Report to [facility] Main Entrance "A" at 0900 A.M., then check in with the Admitting office.  Call this number if you have problems the morning of surgery:  [phone]   Remember:  Do not eat or drink anthing after midnight the night before your surgery   Take these medicines the morning of surgery with A SIP OF WATER:  Amlodipine,,Atorvastatin  As of today, STOP taking any Aleve, Naproxen, Ibuprofen, Motrin, Advil, Goody's, BC's, all herbal medications, fish oil, and all vitamins.  WHAT DO I DO ABOUT MY DIABETES MEDICATION?  HOLD TRULICITY 7 DAYS PRIOR TO SURGERY. LAST DOSE -   Do not take oral diabetes medicines (pills) metFORMIN (GLUCOPHAGE)  the morning of surgery.  THE NIGHT BEFORE SURGERY, take 11 units of [name](glargine) insulin.   Check your blood sugar the morning of your surgery when you wake up and every 2 hours until you get to the Short Stay unit.  If your blood sugar is less than 70 mg/dL, you will need to treat for low blood sugar: Do not take insulin. Treat a low blood sugar (less than 70 mg/dL) with  cup of clear juice (cranberry or apple), 4 glucose  tablets, OR glucose gel. Recheck blood sugar in 15 minutes after treatment (to make sure it is greater than 70 mg/dL). If your blood sugar is not greater than 70 mg/dL on recheck, call [phone] for further instructions.  [facility] [place] is not responsible for any belongings or valuables. .   Do NOT Smoke (Tobacco/Vaping)  24 hours prior to your procedure  If you use a CPAP at night, you may bring your mask for your overnight stay.   Contacts, glasses, hearing aids, dentures or partials may not be worn into surgery, please bring cases for these belongings   Patients discharged the day of surgery will not be allowed to drive home, and someone needs to stay with them for 24 hours.  Special instructions:    Oral Hygiene is also important to reduce your risk of infection.  Remember - BRUSH YOUR TEETH THE MORNING OF SURGERY WITH YOUR REGULAR TOOTHPASTE   Day of Surgery:  Take a shower the day of or night before with antibacterial soap. Wear Clean/Comfortable clothing the morning of surgery Do not apply any deodorants/lotions.   Do not wear jewelry or makeup Do not wear lotions, powders, perfumes/colognes, or deodorant. Do not shave 48 hours prior to surgery.  Men may shave face and neck. Do not bring valuables to the hospital. Do not wear nail polish, gel polish, artificial nails, or any other type of covering on natural nails (fingers and toes) If you  have artificial nails or gel coating that need to be removed by a nail salon, please have this removed prior to surgery. Artificial nails or gel coating may interfere with anesthesia's ability to adequately monitor your vital signs. Remember to brush your teeth WITH YOUR REGULAR TOOTHPASTE.

## 2023-01-05 ENCOUNTER — Ambulatory Visit (HOSPITAL_COMMUNITY): Payer: Medicare Other

## 2023-01-05 ENCOUNTER — Ambulatory Visit (HOSPITAL_BASED_OUTPATIENT_CLINIC_OR_DEPARTMENT_OTHER): Payer: Medicare Other | Admitting: Anesthesiology

## 2023-01-05 ENCOUNTER — Other Ambulatory Visit: Payer: Self-pay

## 2023-01-05 ENCOUNTER — Encounter (HOSPITAL_COMMUNITY)
Admission: RE | Disposition: A | Discharge status: Home / Self Care | Payer: Self-pay | Source: Home / Self Care | Attending: Pulmonary Disease

## 2023-01-05 ENCOUNTER — Encounter (HOSPITAL_COMMUNITY): Payer: Self-pay | Admitting: Pulmonary Disease

## 2023-01-05 ENCOUNTER — Ambulatory Visit (HOSPITAL_COMMUNITY)
Admission: RE | Admit: 2023-01-05 | Discharge: 2023-01-05 | Disposition: A | Discharge status: Home / Self Care | Payer: Medicare Other | Attending: Pulmonary Disease | Admitting: Pulmonary Disease

## 2023-01-05 ENCOUNTER — Ambulatory Visit (HOSPITAL_COMMUNITY): Payer: Medicare Other | Admitting: Anesthesiology

## 2023-01-05 DIAGNOSIS — Z794 Long term (current) use of insulin: Secondary | ICD-10-CM

## 2023-01-05 DIAGNOSIS — N182 Chronic kidney disease, stage 2 (mild): Secondary | ICD-10-CM

## 2023-01-05 DIAGNOSIS — I129 Hypertensive chronic kidney disease with stage 1 through stage 4 chronic kidney disease, or unspecified chronic kidney disease: Secondary | ICD-10-CM | POA: Diagnosis not present

## 2023-01-05 DIAGNOSIS — Z7722 Contact with and (suspected) exposure to environmental tobacco smoke (acute) (chronic): Secondary | ICD-10-CM | POA: Insufficient documentation

## 2023-01-05 DIAGNOSIS — R918 Other nonspecific abnormal finding of lung field: Secondary | ICD-10-CM | POA: Diagnosis not present

## 2023-01-05 DIAGNOSIS — R911 Solitary pulmonary nodule: Secondary | ICD-10-CM | POA: Diagnosis not present

## 2023-01-05 DIAGNOSIS — Z48813 Encounter for surgical aftercare following surgery on the respiratory system: Secondary | ICD-10-CM | POA: Diagnosis not present

## 2023-01-05 DIAGNOSIS — E1122 Type 2 diabetes mellitus with diabetic chronic kidney disease: Secondary | ICD-10-CM | POA: Diagnosis not present

## 2023-01-05 HISTORY — PX: BRONCHIAL BIOPSY: SHX5109

## 2023-01-05 HISTORY — PX: BRONCHIAL BRUSHINGS: SHX5108

## 2023-01-05 HISTORY — PX: BRONCHIAL NEEDLE ASPIRATION BIOPSY: SHX5106

## 2023-01-05 LAB — POCT I-STAT, CHEM 8
BUN: 20 mg/dL (ref 8–23)
Calcium, Ion: 1.23 mmol/L (ref 1.15–1.40)
Chloride: 107 mmol/L (ref 98–111)
Creatinine, Ser: 1.5 mg/dL — ABNORMAL HIGH (ref 0.61–1.24)
Glucose, Bld: 122 mg/dL — ABNORMAL HIGH (ref 70–99)
HCT: 35 % — ABNORMAL LOW (ref 39.0–52.0)
Hemoglobin: 11.9 g/dL — ABNORMAL LOW (ref 13.0–17.0)
Potassium: 4.2 mmol/L (ref 3.5–5.1)
Sodium: 140 mmol/L (ref 135–145)
TCO2: 23 mmol/L (ref 22–32)

## 2023-01-05 LAB — GLUCOSE, CAPILLARY
Glucose-Capillary: 115 mg/dL — ABNORMAL HIGH (ref 70–99)
Glucose-Capillary: 105 mg/dL — ABNORMAL HIGH (ref 70–99)

## 2023-01-05 SURGERY — BRONCHOSCOPY, WITH BIOPSY USING ELECTROMAGNETIC NAVIGATION
Anesthesia: General | Laterality: Left

## 2023-01-05 MED ORDER — OXYCODONE HCL 5 MG PO TABS: 5.0000 mg | ORAL_TABLET | Freq: Once | ORAL | Status: DC | PRN

## 2023-01-05 MED ORDER — PHENYLEPHRINE HCL-NACL 20-0.9 MG/250ML-% IV SOLN
INTRAVENOUS | Status: DC | PRN
  Administered 2023-01-05: 25 ug/min via INTRAVENOUS

## 2023-01-05 MED ORDER — CHLORHEXIDINE GLUCONATE 0.12 % MT SOLN
OROMUCOSAL | Status: AC
  Administered 2023-01-05: 15 mL via OROMUCOSAL
  Filled 2023-01-05: qty 15

## 2023-01-05 MED ORDER — ONDANSETRON HCL 4 MG/2ML IJ SOLN
INTRAMUSCULAR | Status: DC | PRN
  Administered 2023-01-05: 4 mg via INTRAVENOUS

## 2023-01-05 MED ORDER — MIDAZOLAM HCL 5 MG/5ML IJ SOLN
INTRAMUSCULAR | Status: DC | PRN
  Administered 2023-01-05: 2 mg via INTRAVENOUS

## 2023-01-05 MED ORDER — MIDAZOLAM HCL 2 MG/2ML IJ SOLN: 0.5000 mg | Freq: Once | INTRAMUSCULAR | Status: DC | PRN

## 2023-01-05 MED ORDER — SUGAMMADEX SODIUM 200 MG/2ML IV SOLN
INTRAVENOUS | Status: DC | PRN
  Administered 2023-01-05: 200 mg via INTRAVENOUS

## 2023-01-05 MED ORDER — PROPOFOL 500 MG/50ML IV EMUL
INTRAVENOUS | Status: DC | PRN
  Administered 2023-01-05: 150 ug/kg/min via INTRAVENOUS

## 2023-01-05 MED ORDER — FENTANYL CITRATE (PF) 100 MCG/2ML IJ SOLN
INTRAMUSCULAR | Status: DC | PRN
  Administered 2023-01-05: 100 ug via INTRAVENOUS

## 2023-01-05 MED ORDER — PROPOFOL 10 MG/ML IV BOLUS
INTRAVENOUS | Status: DC | PRN
  Administered 2023-01-05: 200 mg via INTRAVENOUS

## 2023-01-05 MED ORDER — ACETAMINOPHEN 500 MG PO TABS: 1000.0000 mg | ORAL_TABLET | Freq: Once | ORAL | Status: AC

## 2023-01-05 MED ORDER — OXYCODONE HCL 5 MG/5ML PO SOLN: 5.0000 mg | Freq: Once | ORAL | Status: DC | PRN

## 2023-01-05 MED ORDER — LACTATED RINGERS IV SOLN: INTRAVENOUS | Status: DC

## 2023-01-05 MED ORDER — ROCURONIUM BROMIDE 10 MG/ML (PF) SYRINGE
PREFILLED_SYRINGE | INTRAVENOUS | Status: DC | PRN
  Administered 2023-01-05: 60 mg via INTRAVENOUS

## 2023-01-05 MED ORDER — DEXAMETHASONE SODIUM PHOSPHATE 10 MG/ML IJ SOLN
INTRAMUSCULAR | Status: DC | PRN
  Administered 2023-01-05: 4 mg via INTRAVENOUS

## 2023-01-05 MED ORDER — MEPERIDINE HCL 25 MG/ML IJ SOLN: 6.2500 mg | INTRAMUSCULAR | Status: DC | PRN

## 2023-01-05 MED ORDER — ACETAMINOPHEN 500 MG PO TABS
ORAL_TABLET | ORAL | Status: AC
  Administered 2023-01-05: 1000 mg via ORAL
  Filled 2023-01-05: qty 2

## 2023-01-05 MED ORDER — INSULIN ASPART 100 UNIT/ML IJ SOLN: 0.0000 [IU] | INTRAMUSCULAR | Status: DC | PRN

## 2023-01-05 MED ORDER — CHLORHEXIDINE GLUCONATE 0.12 % MT SOLN: 15.0000 mL | Freq: Once | OROMUCOSAL | Status: AC

## 2023-01-05 MED ORDER — FENTANYL CITRATE (PF) 100 MCG/2ML IJ SOLN: 25.0000 ug | INTRAMUSCULAR | Status: DC | PRN

## 2023-01-05 MED ORDER — PROMETHAZINE HCL 25 MG/ML IJ SOLN: 6.2500 mg | INTRAMUSCULAR | Status: DC | PRN

## 2023-01-05 MED ORDER — LIDOCAINE 2% (20 MG/ML) 5 ML SYRINGE
INTRAMUSCULAR | Status: DC | PRN
  Administered 2023-01-05: 20 mg via INTRAVENOUS

## 2023-01-05 NOTE — Transfer of Care (Signed)
Immediate Anesthesia Transfer of Care Note  Patient: William Mcguire  Procedure(s) Performed: ROBOTIC ASSISTED NAVIGATIONAL BRONCHOSCOPY (Left) BRONCHIAL BIOPSIES BRONCHIAL NEEDLE ASPIRATION BIOPSIES BRONCHIAL BRUSHINGS  Patient Location: PACU  Anesthesia Type:General  Level of Consciousness: awake, alert , and oriented  Airway & Oxygen Therapy: Patient Spontanous Breathing and Patient connected to nasal cannula oxygen  Post-op Assessment: Report given to RN and Post -op Vital signs reviewed and stable  Post vital signs: Reviewed and stable  Last Vitals:  Vitals Value Taken Time  BP 120/76 01/05/23 1150  Temp    Pulse 66 01/05/23 1152  Resp 19 01/05/23 1152  SpO2 100 % 01/05/23 1152  Vitals shown include unfiled device data.  Last Pain:  Vitals:   01/05/23 0916  TempSrc:   PainSc: 0-No pain         Complications: No notable events documented.

## 2023-01-05 NOTE — Anesthesia Procedure Notes (Signed)
Procedure Name: Intubation Date/Time: 01/05/2023 11:00 AM  Performed by: Rachel Moulds, CRNAPre-anesthesia Checklist: Emergency Drugs available, Patient identified, Suction available, Patient being monitored and Timeout performed Patient Re-evaluated:Patient Re-evaluated prior to induction Oxygen Delivery Method: Circle system utilized Preoxygenation: Pre-oxygenation with 100% oxygen Induction Type: IV induction Ventilation: Mask ventilation without difficulty Laryngoscope Size: Mac and 4 Grade View: Grade II Tube type: Oral Tube size: 8.5 mm Number of attempts: 1 Airway Equipment and Method: Stylet Placement Confirmation: ETT inserted through vocal cords under direct vision, positive ETCO2, CO2 detector and breath sounds checked- equal and bilateral Secured at: 24 cm Tube secured with: Tape Dental Injury: Teeth and Oropharynx as per pre-operative assessment

## 2023-01-05 NOTE — Discharge Instructions (Signed)
Flexible Bronchoscopy, Care After This sheet gives you information about how to care for yourself after your test. Your doctor may also give you more specific instructions. If you have problems or questions, contact your doctor. Follow these instructions at home: Eating and drinking Do not eat or drink anything (not even water) for 2 hours after your test, or until your numbing medicine (local anesthetic) wears off. When your numbness is gone and your cough and gag reflexes have come back, you may: Eat only soft foods. Slowly drink liquids. The day after the test, go back to your normal diet. Driving Do not drive for 24 hours if you were given a medicine to help you relax (sedative). Do not drive or use heavy machinery while taking prescription pain medicine. General instructions  Take over-the-counter and prescription medicines only as told by your doctor. Return to your normal activities as told. Ask what activities are safe for you. Do not use any products that have nicotine or tobacco in them. This includes cigarettes and e-cigarettes. If you need help quitting, ask your doctor. Keep all follow-up visits as told by your doctor. This is important. It is very important if you had a tissue sample (biopsy) taken. Get help right away if: You have shortness of breath that gets worse. You get light-headed. You feel like you are going to pass out (faint). You have chest pain. You cough up: More than a little blood. More blood than before. Summary Do not eat or drink anything (not even water) for 2 hours after your test, or until your numbing medicine wears off. Do not use cigarettes. Do not use e-cigarettes. Get help right away if you have chest pain.  This information is not intended to replace advice given to you by your health care provider. Make sure you discuss any questions you have with your health care provider. Document Released: 04/06/2009 Document Revised: 05/22/2017 Document  Reviewed: 06/27/2016 Elsevier Patient Education  2020 [employer]  

## 2023-01-05 NOTE — Op Note (Signed)
Video Bronchoscopy with Robotic Assisted Bronchoscopic Navigation   Date of Operation: 01/05/2023   Pre-op Diagnosis: Lung nodule   Post-op Diagnosis: Lung nodule   Surgeon: Josephine Igo, DO   Assistants: None   Anesthesia: General endotracheal anesthesia  Operation: Flexible video fiberoptic bronchoscopy with robotic assistance and biopsies.  Estimated Blood Loss: Minimal  Complications: None  Indications and History: William Mcguire is a 67 y.o. male with history of lung nodule. The risks, benefits, complications, treatment options and expected outcomes were discussed with the patient.  The possibilities of pneumothorax, pneumonia, reaction to medication, pulmonary aspiration, perforation of a viscus, bleeding, failure to diagnose a condition and creating a complication requiring transfusion or operation were discussed with the patient who freely signed the consent.    Description of Procedure: The patient was seen in the Preoperative Area, was examined and was deemed appropriate to proceed.  The patient was taken to [facility] endoscopy room 3, identified as [name] and the procedure verified as Flexible Video Fiberoptic Bronchoscopy.  A Time Out was held and the above information confirmed.   Prior to the date of the procedure a high-resolution CT scan of the chest was performed. Utilizing ION software program a virtual tracheobronchial tree was generated to allow the creation of distinct navigation pathways to the patient's parenchymal abnormalities. After being taken to the operating room general anesthesia was initiated and the patient  was orally intubated. The video fiberoptic bronchoscope was introduced via the endotracheal tube and a general inspection was performed which showed normal right and left lung anatomy, aspiration of the bilateral mainstems was completed to remove any remaining secretions. Robotic catheter inserted into patient's endotracheal tube.   Target #1  LUL: The distinct navigation pathways prepared prior to this procedure were then utilized to navigate to patient's lesion identified on CT scan. The robotic catheter was secured into place and the vision probe was withdrawn.  Lesion location was approximated using fluoroscopy and 3D CBCT for CT guided needle placement for peripheral targeting. Under fluoroscopic guidance transbronchial needle brushings, transbronchial needle biopsies, and transbronchial forceps biopsies were performed to be sent for cytology and pathology. Additional tissue biopsies will be sent for cultures.   At the end of the procedure a general airway inspection was performed and there was no evidence of active bleeding. The bronchoscope was removed.  The patient tolerated the procedure well. There was no significant blood loss and there were no obvious complications. A post-procedural chest x-ray is pending.  Samples Target #1: 1. Transbronchial needle brushings from LUL 2. Transbronchial Wang needle biopsies from LUL  3. Transbronchial forceps biopsies from LUL  Plans:  The patient will be discharged from the PACU to home when recovered from anesthesia and after chest x-ray is reviewed. We will review the cytology, pathology and microbiology results with the patient when they become available. Outpatient followup will be with Josephine Igo, DO.  Josephine Igo, DO [facility] 01/05/2023 11:39 AM

## 2023-01-05 NOTE — Interval H&P Note (Signed)
History and Physical Interval Note:  01/05/2023 10:48 AM  William Mcguire  has presented today for surgery, with the diagnosis of LUNG NODULE LEFT UPPER AND LOWER LOBE.  The various methods of treatment have been discussed with the patient and family. After consideration of risks, benefits and other options for treatment, the patient has consented to  Procedure(s): ROBOTIC ASSISTED NAVIGATIONAL BRONCHOSCOPY (Left) as a surgical intervention.  The patient's history has been reviewed, patient examined, no change in status, stable for surgery.  I have reviewed the patient's chart and labs.  Questions were answered to the patient's satisfaction.     [name]

## 2023-01-05 NOTE — Anesthesia Preprocedure Evaluation (Signed)
Anesthesia Evaluation  Patient identified by MRN, date of birth, ID band Patient awake    Reviewed: Allergy & Precautions, NPO status , Patient's Chart, lab work & pertinent test results  History of Anesthesia Complications Negative for: history of anesthetic complications  Airway Mallampati: II  TM Distance: >3 FB Neck ROM: Full    Dental  (+) Dental Advisory Given   Pulmonary  Lung nodule   breath sounds clear to auscultation       Cardiovascular hypertension, Pt. 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), Insulin Dependent  BMI 30  Renal/GU Renal InsufficiencyRenal disease     Musculoskeletal   Abdominal   Peds  Hematology   Anesthesia Other Findings   Reproductive/Obstetrics                              Anesthesia Physical Anesthesia Plan  ASA: 3  Anesthesia Plan: General   Post-op Pain Management: Tylenol PO (pre-op)*   Induction: Intravenous  PONV Risk Score and Plan: 2 and Ondansetron and Dexamethasone  Airway Management Planned: Oral ETT  Additional Equipment: None  Intra-op Plan:   Post-operative Plan: Extubation in OR  Informed Consent: I have reviewed the patients History and Physical, chart, labs and discussed the procedure including the risks, benefits and alternatives for the proposed anesthesia with the patient or authorized representative who has indicated his/her understanding and acceptance.     Dental advisory given  Plan Discussed with: CRNA and Surgeon  Anesthesia Plan Comments:          Anesthesia Quick Evaluation

## 2023-01-05 NOTE — Anesthesia Postprocedure Evaluation (Signed)
Anesthesia Post Note  Patient: Yaacov Koziol  Procedure(s) Performed: ROBOTIC ASSISTED NAVIGATIONAL BRONCHOSCOPY (Left) BRONCHIAL BIOPSIES BRONCHIAL NEEDLE ASPIRATION BIOPSIES BRONCHIAL BRUSHINGS     Patient location during evaluation: PACU Anesthesia Type: General Level of consciousness: awake and alert Pain management: pain level controlled Vital Signs Assessment: post-procedure vital signs reviewed and stable Respiratory status: spontaneous breathing, nonlabored ventilation and respiratory function stable Cardiovascular status: blood pressure returned to baseline and stable Postop Assessment: no apparent nausea or vomiting Anesthetic complications: no   No notable events documented.  Last Vitals:  Vitals:   01/05/23 1152 01/05/23 1200  BP:  115/78  Pulse: 66 70  Resp: 19 17  Temp:    SpO2: 100% 99%    Last Pain:  Vitals:   01/05/23 1150  TempSrc:   PainSc: Asleep                 Lurline Caver,E. Anyelo Mccue

## 2023-01-06 ENCOUNTER — Encounter (HOSPITAL_COMMUNITY): Payer: Self-pay | Admitting: Pulmonary Disease

## 2023-01-06 LAB — CYTOLOGY - NON PAP

## 2023-01-07 ENCOUNTER — Institutional Professional Consult (permissible substitution): Payer: Medicare Other | Admitting: Pulmonary Disease

## 2023-01-07 LAB — ACID FAST SMEAR (AFB, MYCOBACTERIA): Acid Fast Smear: NEGATIVE

## 2023-01-09 ENCOUNTER — Inpatient Hospital Stay: Payer: Medicare Other | Attending: Hematology and Oncology | Admitting: Hematology and Oncology

## 2023-01-09 ENCOUNTER — Encounter: Payer: Self-pay | Admitting: Hematology and Oncology

## 2023-01-09 VITALS — BP 140/74 | HR 71 | Temp 97.8°F | Resp 18 | Ht 75.0 in | Wt 246.6 lb

## 2023-01-09 DIAGNOSIS — D472 Monoclonal gammopathy: Secondary | ICD-10-CM | POA: Diagnosis not present

## 2023-01-09 DIAGNOSIS — R942 Abnormal results of pulmonary function studies: Secondary | ICD-10-CM | POA: Insufficient documentation

## 2023-01-09 DIAGNOSIS — R918 Other nonspecific abnormal finding of lung field: Secondary | ICD-10-CM

## 2023-01-09 LAB — FUNGUS CULTURE RESULT

## 2023-01-09 LAB — FUNGUS CULTURE WITH STAIN

## 2023-01-09 NOTE — Progress Notes (Signed)
William Mcguire, you are seeing him next week. I will be out of town.   Thanks,  BLI  Josephine Igo, DO [facility] 01/09/2023 4:17 PM

## 2023-01-09 NOTE — Assessment & Plan Note (Signed)
I have reviewed imaging and biopsy result Unfortunately biopsy was non diagnostic/area of biopsy showed no malignancy Cultures are negative We discussed options including referral to CT surgeon, tertiary center, repeat imaging in a few months or radiation oncology consult He is undecided He will call me on Monday for final decision

## 2023-01-09 NOTE — Progress Notes (Signed)
[facility] OFFICE PROGRESS NOTE  Patient Care Team: [name], MD as PCP - General (Internal Medicine)  ASSESSMENT & PLAN:  Abnormal CT scan of lung I have reviewed imaging and biopsy result Unfortunately biopsy was non diagnostic/area of biopsy showed no malignancy Cultures are negative We discussed options including referral to CT surgeon, tertiary center, repeat imaging in a few months or radiation oncology consult He is undecided He will call me on Monday for final decision  No orders of the defined types were placed in this encounter.   All questions were answered. The patient knows to call the clinic with any problems, questions or concerns. The total time spent in the appointment was 40 minutes encounter with patients including review of chart and various tests results, discussions about plan of care and coordination of care plan   Artis Delay, MD 01/09/2023 1:43 PM  INTERVAL HISTORY: Please see below for problem oriented charting. he returns for review of test results He tolerated recent procedure well We discussed test results and next step  REVIEW OF SYSTEMS:   Constitutional: Denies fevers, chills or abnormal weight loss Eyes: Denies blurriness of vision Ears, nose, mouth, throat, and face: Denies mucositis or sore throat Respiratory: Denies cough, dyspnea or wheezes Cardiovascular: Denies palpitation, chest discomfort or lower extremity swelling Gastrointestinal:  Denies nausea, heartburn or change in bowel habits Skin: Denies abnormal skin rashes Lymphatics: Denies new lymphadenopathy or easy bruising Neurological:Denies numbness, tingling or new weaknesses Behavioral/Psych: Mood is stable, no new changes  All other systems were reviewed with the patient and are negative.  I have reviewed the past medical history, past surgical history, social history and family history with the patient and they are unchanged from previous note.  ALLERGIES:   has No Known Allergies.  MEDICATIONS:  Current Outpatient Medications  Medication Sig Dispense Refill   amLODipine (NORVASC) 10 MG tablet TAKE 1 TABLET BY MOUTH EVERY DAY 90 tablet 1   atorvastatin (LIPITOR) 40 MG tablet TAKE 1 TABLET BY MOUTH EVERY DAY 90 tablet 1   cyanocobalamin (VITAMIN B12) 1000 MCG/ML injection INJECT 1 ML (1,000 MCG TOTAL) INTO THE MUSCLE EVERY 30 DAYS. 9 mL 1   Dulaglutide 1.5 MG/0.5ML SOPN Inject 1.5 mg into the skin once a week. 6 mL 2   enalapril (VASOTEC) 10 MG tablet TAKE 1 TABLET BY MOUTH EVERY DAY 90 tablet 2   glucose blood (ONETOUCH VERIO) test strip Use as instructed to check blood sugars twice daily E11.69 100 each 2   glucose blood test strip Use as instructed to test blood sugar 3 times a day. Dx code: e11.65 100 each 2   Insulin Glargine ([name] KWIKPEN) 100 UNIT/ML Inject 0.22 mLs (22 Units total) into the skin at bedtime. (Patient taking differently: Inject 11 Units into the skin at bedtime.) 45 mL 3   metFORMIN (GLUCOPHAGE) 1000 MG tablet TAKE 1 TABLET BY MOUTH TWICE A DAY WITH FOOD 180 tablet 0   sildenafil (VIAGRA) 100 MG tablet TAKE 1 TABLET BY MOUTH EVERY DAY AS NEEDED (Patient taking differently: Take 100 mg by mouth as needed for erectile dysfunction.) 10 tablet 5   SYRINGE-NEEDLE, DISP, 3 ML (B-D INTEGRA SYRINGE) 25G X 5/8" 3 ML MISC USE AS DIRECTED 12 each 24   No current facility-administered medications for this visit.    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 No lytic or destructive lesions within the  bones. 2. 2.9 cm nodular opacity in the hilar region on the left, unchanged from recent CT. 3. Scattered degenerative changes.   06/02/2022 Bone Marrow Biopsy   BONE MARROW, ASPIRATE, CLOT, CORE: -Variably cellular bone marrow involved by plasma cell neoplasm (10% plasma cells by manual aspirate differential, approximately 10 to 15% by CD138  immunohistochemical stains on clot and core sections, and kappa restricted by kappa/lambda in situ hybridization.).  See comment.  PERIPHERAL BLOOD: -Normocytic normochromic anemia  COMMENT:   Morphological evaluation of the bone marrow clot sections reveals multiple lymphoid aggregates in addition to increased plasma cells.  CD3 and CD20 stains are attempted, however the lymphoid aggregates are lost on deeper levels with a single lymphoid aggregate showing slightly more CD20 staining than CD3.  While flow cytometry is negative for a clonal B-cell population, close clinical and imaging follow-up is recommended along with assessment of peripheral blood and potential sites of lymphadenopathy/organomegaly as clinically indicated.   MICROSCOPIC DESCRIPTION:  PERIPHERAL BLOOD SMEAR: Platelets: Adequate, no platelet clumps identified Erythroid: Normocytic normochromic anemia Leukocytes: Adequate, negative for dysplastic granulocytes, blasts or plasma cells.  Plasmacytoid lymphocytes present.  BONE MARROW ASPIRATE: Cellular Erythroid precursors: Erythroid precursors show a full sequence of generally orderly maturation Granulocytic precursors: Granulocytic precursors show full sequence of generally orderly maturation Megakaryocytes: Typical in number and morphology Lymphocytes/plasma cells: Plasma cells are increased  TOUCH PREPARATIONS: Confirmatory of the aspirate findings  CLOT AND BIOPSY: The bone marrow clot and biopsy reveal a variably cellular bone marrow (20 to 60%) with trilineage hematopoiesis.  Plasma cells are increased and present singly scattered and in clusters.  Also present are loosely circumscribed lymphoid aggregates with small mature lymphocytes.  The findings are confirmatory of the aspirate and touch prep impression.  SPECIAL stains: CD138: CD138 highlights plasma cells, approximately 10% of cellularity in clot and core sections Kappa/lambda ISH: Kappa/lambda ISH shows  kappa restricted plasma cells CD3: CD3 highlights T lymphocytes in the lymphoid aggregates CD20: CD20 highlights B lymphocytes in lymphoid aggregates (slightly more than CD3) CD56: CD56 is positive in the plasma cells MUM 1: MUM1 is positive in the plasma cells IRON STAIN: Iron stains are performed on a bone marrow aspirate or touch imprint smear and section of clot. The controls stained appropriately.       Storage Iron: Scant      Ring Sideroblasts: Not identified  ADDITIONAL DATA/TESTING: Multiple myeloma panel  CELL COUNT DATA:  Bone Marrow count performed on 500 cells shows: Blasts:   0%   Myeloid:  43% Promyelocytes: 0%   Erythroid:     37% Myelocytes:    5%   Lymphocytes:   7% Metamyelocytes:     2%   Plasma cells:  10% Bands:    4% Neutrophils:   30%  M:E ratio:     1.16 Eosinophils:   2% Basophils:     0% Monocytes:     3%      PHYSICAL EXAMINATION: ECOG PERFORMANCE STATUS: 0 - Asymptomatic  Vitals:   01/09/23 1302  BP: (!) 140/74  Pulse: 71  Resp: 18  Temp: 97.8 F (36.6 C)  SpO2: 100%   Filed Weights   01/09/23 1302  Weight: 246 lb 9.6 oz (111.9 kg)    GENERAL:alert, no distress and comfortable NEURO: alert & oriented x 3 with fluent speech, no focal motor/sensory deficits  LABORATORY DATA:  I have reviewed the data as listed    Component Value Date/Time   NA 140 01/05/2023 0917  NA 137 11/05/2022 1152   K 4.2 01/05/2023 0917   CL 107 01/05/2023 0917   CO2 26 11/18/2022 1009   GLUCOSE 122 (H) 01/05/2023 0917   BUN 20 01/05/2023 0917   BUN 11 11/05/2022 1152   CREATININE 1.50 (H) 01/05/2023 0917   CREATININE 1.14 08/19/2022 1314   CALCIUM 9.2 11/18/2022 1009   PROT 8.0 11/18/2022 1009   PROT 7.7 08/18/2022 1518   ALBUMIN 4.0 11/18/2022 1009   ALBUMIN 4.3 08/18/2022 1518   AST 26 11/18/2022 1009   AST 22 08/19/2022 1314   ALT 20 11/18/2022 1009   ALT 16 08/19/2022 1314   ALKPHOS 78 11/18/2022 1009   BILITOT 0.4 11/18/2022 1009    BILITOT 0.5 08/19/2022 1314   GFRNONAA 58 (L) 11/18/2022 1009   GFRNONAA >60 08/19/2022 1314   GFRAA 70 08/06/2020 1044    No results found for: "SPEP", "UPEP"  Lab Results  Component Value Date   WBC 5.3 11/18/2022   NEUTROABS 2.7 11/18/2022   HGB 11.9 (L) 01/05/2023   HCT 35.0 (L) 01/05/2023   MCV 85.7 11/18/2022   PLT 186 11/18/2022      Chemistry      Component Value Date/Time   NA 140 01/05/2023 0917   NA 137 11/05/2022 1152   K 4.2 01/05/2023 0917   CL 107 01/05/2023 0917   CO2 26 11/18/2022 1009   BUN 20 01/05/2023 0917   BUN 11 11/05/2022 1152   CREATININE 1.50 (H) 01/05/2023 0917   CREATININE 1.14 08/19/2022 1314      Component Value Date/Time   CALCIUM 9.2 11/18/2022 1009   ALKPHOS 78 11/18/2022 1009   AST 26 11/18/2022 1009   AST 22 08/19/2022 1314   ALT 20 11/18/2022 1009   ALT 16 08/19/2022 1314   BILITOT 0.4 11/18/2022 1009   BILITOT 0.5 08/19/2022 1314       RADIOGRAPHIC STUDIES:I reviewed imaging/PET with him I have personally reviewed the radiological images as listed and agreed with the findings in the report. DG Chest Port 1 View  Result Date: 01/05/2023 CLINICAL DATA:  Status post bronchoscopy. EXAM: PORTABLE CHEST 1 VIEW COMPARISON:  01/18/2011 FINDINGS: Cardiomediastinal silhouette is normal. Mediastinal contours appear intact. There is no evidence of focal airspace consolidation, pleural effusion or pneumothorax. Left middle lobe pulmonary mass better seen on recent CT. Osseous structures are without acute abnormality. Soft tissues are grossly normal. IMPRESSION: 1. No evidence of pneumothorax. 2. Left middle lobe pulmonary mass better seen on recent CT. Electronically Signed   By: Ted Mcalpine M.D.   On: 01/05/2023 12:14   DG C-ARM BRONCHOSCOPY  Result Date: 01/05/2023 C-ARM BRONCHOSCOPY: Fluoroscopy was utilized by the requesting physician.  No radiographic interpretation.

## 2023-01-10 LAB — AEROBIC/ANAEROBIC CULTURE W GRAM STAIN (SURGICAL/DEEP WOUND)
Culture: NO GROWTH
Gram Stain: NONE SEEN

## 2023-01-12 ENCOUNTER — Telehealth: Payer: Self-pay

## 2023-01-12 ENCOUNTER — Other Ambulatory Visit: Payer: Self-pay | Admitting: Hematology and Oncology

## 2023-01-12 ENCOUNTER — Encounter: Payer: Self-pay | Admitting: Hematology and Oncology

## 2023-01-12 DIAGNOSIS — R918 Other nonspecific abnormal finding of lung field: Secondary | ICD-10-CM

## 2023-01-12 NOTE — Telephone Encounter (Signed)
Returned call to wife. They would like referral to [facility]. Wife called them earlier and they said to send urgent referral.

## 2023-01-12 NOTE — Telephone Encounter (Signed)
Due to external referral, the referral does not go anywhere Please refer to Dr. Gaynelle Adu, [phone] I do not know how long it would take even for urgent referral

## 2023-01-12 NOTE — Telephone Encounter (Signed)
Faxed referral to Dr. Gaynelle Adu at [facility] at [phone], received fax confirmation.

## 2023-01-13 ENCOUNTER — Ambulatory Visit: Payer: Medicare Other | Admitting: Acute Care

## 2023-01-13 ENCOUNTER — Encounter: Payer: Self-pay | Admitting: Acute Care

## 2023-01-13 VITALS — BP 132/64 | HR 64 | Temp 97.2°F | Ht 75.0 in | Wt 247.2 lb

## 2023-01-13 DIAGNOSIS — R911 Solitary pulmonary nodule: Secondary | ICD-10-CM | POA: Diagnosis not present

## 2023-01-13 NOTE — Patient Instructions (Addendum)
It is good to see you today. I am glad you have done well after the bronchoscopy. Your biopsy was non diagnostic. I will have Dr. Tonia Brooms call you next week when he returns to the office to discuss the results of the biopsy. We will base follow up on your conversation with Dr. Tonia Brooms.  I will have him call you at [phone] William Mcguire ) Please let us know if we can do anything for you. Follow up with Dr. Bertis Ruddy and Dr. Gaynelle Adu, at [facility].  Please contact office for sooner follow up if symptoms do not improve or worsen or seek emergency care

## 2023-01-13 NOTE — Progress Notes (Signed)
History of Present Illness William Mcguire is a 67 y.o. male  never smoker with PET/CT imaging confirming hypermetabolic activity on the left upper lobe, concerning for primary lung cancer . Pt was referred to Dr. Tonia Brooms for further evaluation and consideration of a bronchoscopy with biopsies. He was referred by Dr. Bertis Ruddy 12/2020.   Synopsis 67 y.o.never smoker with PET/CT imaging confirming hypermetabolic activity on the left upper lobe, concerning for primary lung cancer . Pt was referred to Dr. Tonia Brooms for further evaluation and consideration of a bronchoscopy with biopsies. He underwent Flexible video fiberoptic bronchoscopy with robotic assistance and biopsies on 01/05/2023.    01/13/2023 It is good to see you today. He states he has done well after the bronch. No hemoptysis or fever, signs or symptoms of infection. Breath sounds are clear and all lobes are heard upon auscultation. We discussed his biopsy results. His biopsies were non -diagnostic. Cytology shows LUL nodule FNA showed no malignant cells. LUL brushing was non-diagnostic material.  We have discussed the biopsy results with William Mcguire and his wife. They are very disappointed that the biopsy was non-diagnostic. They want to speak with Dr. Tonia Brooms personally to better understand why the biopsy did not give a definitive answer. I explained in a mixed density nodule it is sometimes possible not to get cells from the right spot. I have ensured William Mcguire that I will message Dr. Tonia Brooms and have him give her a call.  Patient was seen by Dr. Bertis Ruddy 01/09/2023. She had also reviewed the results with them. She has referred them to Dr.  Gaynelle Adu, [phone]  at [facility] for further evaluation.    Test Results: Cytology 01/05/2023 A. LUNG, LUL, FINE NEEDLE ASPIRATION:  - No malignant cells identified   B. LUNG, LUL, BRUSH:  - Nondiagnostic material    Micro 01/05/2023. AFB Negative Aerobic/ Anaerobic : No Growth NO fungus was  observed.      Latest Ref Rng & Units 01/05/2023    9:17 AM 11/18/2022   10:09 AM 08/19/2022    1:14 PM  CBC  WBC 4.0 - 10.5 K/uL  5.3  5.8   Hemoglobin 13.0 - 17.0 g/dL 09.8  11.9  14.7   Hematocrit 39.0 - 52.0 % 35.0  33.6  37.0   Platelets 150 - 400 K/uL  186  190        Latest Ref Rng & Units 01/05/2023    9:17 AM 11/18/2022   10:09 AM 11/05/2022   11:52 AM  BMP  Glucose 70 - 99 mg/dL 829  562  130   BUN 8 - 23 mg/dL 20  14  11    Creatinine 0.61 - 1.24 mg/dL 8.65  7.84  6.96   BUN/Creat Ratio 10 - 24   9   Sodium 135 - 145 mmol/L 140  136  137   Potassium 3.5 - 5.1 mmol/L 4.2  4.2  4.7   Chloride 98 - 111 mmol/L 107  105  100   CO2 22 - 32 mmol/L  26  26   Calcium 8.9 - 10.3 mg/dL  9.2  9.5     BNP No results found for: "BNP"  ProBNP No results found for: "PROBNP"  PFT No results found for: "FEV1PRE", "FEV1POST", "FVCPRE", "FVCPOST", "TLC", "DLCOUNC", "PREFEV1FVCRT", "PSTFEV1FVCRT"  DG Chest Port 1 View  Result Date: 01/05/2023 CLINICAL DATA:  Status post bronchoscopy. EXAM: PORTABLE CHEST 1 VIEW COMPARISON:  01/18/2011 FINDINGS: Cardiomediastinal silhouette is normal. Mediastinal contours appear intact. There  is no evidence of focal airspace consolidation, pleural effusion or pneumothorax. Left middle lobe pulmonary mass better seen on recent CT. Osseous structures are without acute abnormality. Soft tissues are grossly normal. IMPRESSION: 1. No evidence of pneumothorax. 2. Left middle lobe pulmonary mass better seen on recent CT. Electronically Signed   By: Ted Mcalpine M.D.   On: 01/05/2023 12:14   DG C-ARM BRONCHOSCOPY  Result Date: 01/05/2023 C-ARM BRONCHOSCOPY: Fluoroscopy was utilized by the requesting physician.  No radiographic interpretation.     Past medical hx Past Medical History:  Diagnosis Date   Diabetes mellitus    High cholesterol    Hypertension      Social History   Tobacco Use   Smoking status: Never   Smokeless tobacco: Never    Tobacco comments:    n/a  Vaping Use   Vaping status: Never Used  Substance Use Topics   Alcohol use: No   Drug use: No    [facility] reports that he has never smoked. He has never used smokeless tobacco. He reports that he does not drink alcohol and does not use drugs.  Tobacco Cessation: Never smoker   Past surgical hx, Family hx, Social hx all reviewed.  Current Outpatient Medications on File Prior to Visit  Medication Sig   amLODipine (NORVASC) 10 MG tablet TAKE 1 TABLET BY MOUTH EVERY DAY   atorvastatin (LIPITOR) 40 MG tablet TAKE 1 TABLET BY MOUTH EVERY DAY   cyanocobalamin (VITAMIN B12) 1000 MCG/ML injection INJECT 1 ML (1,000 MCG TOTAL) INTO THE MUSCLE EVERY 30 DAYS.   Dulaglutide 1.5 MG/0.5ML SOPN Inject 1.5 mg into the skin once a week.   enalapril (VASOTEC) 10 MG tablet TAKE 1 TABLET BY MOUTH EVERY DAY   glucose blood (ONETOUCH VERIO) test strip Use as instructed to check blood sugars twice daily E11.69   glucose blood test strip Use as instructed to test blood sugar 3 times a day. 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 (Patient taking differently: Inject 11 Units into the skin at bedtime.)   metFORMIN (GLUCOPHAGE) 1000 MG tablet TAKE 1 TABLET BY MOUTH TWICE A DAY WITH FOOD   sildenafil (VIAGRA) 100 MG tablet TAKE 1 TABLET BY MOUTH EVERY DAY AS NEEDED (Patient taking differently: Take 100 mg by mouth as needed for erectile dysfunction.)   SYRINGE-NEEDLE, DISP, 3 ML (B-D INTEGRA SYRINGE) 25G X 5/8" 3 ML MISC USE AS DIRECTED   No current facility-administered medications on file prior to visit.     No Known Allergies  Review Of Systems:  Constitutional:   No  weight loss, night sweats,  Fevers, chills, fatigue, or  lassitude.  HEENT:   No headaches,  Difficulty swallowing,  Tooth/dental problems, or  Sore throat,                No sneezing, itching, ear ache, nasal congestion, post  nasal drip,   CV:  No chest pain,  Orthopnea, PND, swelling in lower extremities, anasarca, dizziness, palpitations, syncope.   GI  No heartburn, indigestion, abdominal pain, nausea, vomiting, diarrhea, change in bowel habits, loss of appetite, bloody stools.   Resp: No shortness of breath with exertion or at rest.  No excess mucus, no productive cough,  No non-productive cough,  No coughing up of blood.  No change in color of mucus.  No wheezing.  No chest wall deformity  Skin: no rash or lesions.  GU: no dysuria, change in color of urine, no urgency or frequency.  No flank pain, no hematuria   MS:  No joint pain or swelling.  No decreased range of motion.  No back pain.  Psych:  No change in mood or affect. No depression or anxiety.  No memory loss.   Vital Signs BP 132/64 (BP Location: Left Arm, Patient Position: Sitting, Cuff Size: Normal)   Pulse 64   Temp (!) 97.2 F (36.2 C) (Temporal)   Ht 6\' 3"  (1.905 m)   Wt 247 lb 3.2 oz (112.1 kg)   SpO2 100%   BMI 30.90 kg/m    Physical Exam:  General- No distress,  A&Ox3, pleasant, upset he does not have diagnosis ENT: No sinus tenderness, TM clear, pale nasal mucosa, no oral exudate,no post nasal drip, no LAN Cardiac: S1, S2, regular rate and rhythm, no murmur Chest: No wheeze/ rales/ dullness; no accessory muscle use, no nasal flaring, no sternal retractions Abd.: Soft Non-tender, ND, BS +. Body mass index is 30.9 kg/m.  Ext: No clubbing cyanosis, edema Neuro:  normal strength, MAE x 4, A&O x 3 Skin: No rashes, warm and dry, no lesions  Psych: normal mood and behavior, upset about no definitive diagnosis   Assessment/Plan Non Diagnostic Pulmonary Biopsy Never smoker Plan I am glad you have done well after the bronchoscopy. Your biopsy was non diagnostic. I will have Dr. Tonia Brooms call you next week when he returns to the office to discuss the results of the biopsy. We will base follow up on your conversation with Dr.  Tonia Brooms.  I will have him call you at [phone] Imagene Gurney ) Please let us know if we can do anything for you. Follow up with Dr. Bertis Ruddy and Dr. Gaynelle Adu, at [facility].  Please contact office for sooner follow up if symptoms do not improve or worsen or seek emergency care    I spent 25 minutes dedicated to the care of this patient on the date of this encounter to include pre-visit review of records, face-to-face time with the patient discussing conditions above, post visit ordering of testing, clinical documentation with the electronic health record, making appropriate referrals as documented, and communicating necessary information to the patient's healthcare team.    Bevelyn Ngo, NP 01/13/2023  10:46 AM

## 2023-01-14 ENCOUNTER — Ambulatory Visit: Payer: Medicare Other | Admitting: Acute Care

## 2023-01-23 ENCOUNTER — Other Ambulatory Visit: Payer: Self-pay | Admitting: Internal Medicine

## 2023-01-29 ENCOUNTER — Telehealth: Payer: Self-pay

## 2023-01-29 NOTE — Telephone Encounter (Signed)
-----   Message from Artis Delay sent at 01/29/2023  8:14 AM EDT ----- Regarding: mychart message I can see him on 8/23, 20 mins Get him labs today or tomorrow

## 2023-01-29 NOTE — Telephone Encounter (Signed)
Called and scheduled appts with wife.  Lab scheduled on 8/13 per her request.

## 2023-02-02 ENCOUNTER — Inpatient Hospital Stay: Payer: Medicare Other | Attending: Hematology and Oncology

## 2023-02-02 ENCOUNTER — Other Ambulatory Visit: Payer: Self-pay

## 2023-02-02 DIAGNOSIS — D539 Nutritional anemia, unspecified: Secondary | ICD-10-CM

## 2023-02-02 DIAGNOSIS — J181 Lobar pneumonia, unspecified organism: Secondary | ICD-10-CM | POA: Diagnosis not present

## 2023-02-02 DIAGNOSIS — C3412 Malignant neoplasm of upper lobe, left bronchus or lung: Secondary | ICD-10-CM | POA: Diagnosis not present

## 2023-02-02 DIAGNOSIS — R918 Other nonspecific abnormal finding of lung field: Secondary | ICD-10-CM

## 2023-02-02 DIAGNOSIS — D472 Monoclonal gammopathy: Secondary | ICD-10-CM | POA: Diagnosis not present

## 2023-02-02 DIAGNOSIS — N182 Chronic kidney disease, stage 2 (mild): Secondary | ICD-10-CM

## 2023-02-02 LAB — COMPREHENSIVE METABOLIC PANEL
ALT: 17 U/L (ref 0–44)
AST: 22 U/L (ref 15–41)
Albumin: 3.8 g/dL (ref 3.5–5.0)
Alkaline Phosphatase: 83 U/L (ref 38–126)
Anion gap: 6 (ref 5–15)
BUN: 12 mg/dL (ref 8–23)
CO2: 26 mmol/L (ref 22–32)
Calcium: 9 mg/dL (ref 8.9–10.3)
Chloride: 106 mmol/L (ref 98–111)
Creatinine, Ser: 1.43 mg/dL — ABNORMAL HIGH (ref 0.61–1.24)
GFR, Estimated: 53 mL/min — ABNORMAL LOW (ref >60)
Glucose, Bld: 167 mg/dL — ABNORMAL HIGH (ref 70–99)
Potassium: 3.9 mmol/L (ref 3.5–5.1)
Sodium: 138 mmol/L (ref 135–145)
Total Bilirubin: 0.4 mg/dL (ref 0.3–1.2)
Total Protein: 7.7 g/dL (ref 6.5–8.1)

## 2023-02-02 LAB — CBC WITH DIFFERENTIAL/PLATELET
Abs Immature Granulocytes: 0.06 10*3/uL (ref 0.00–0.07)
Basophils Absolute: 0 10*3/uL (ref 0.0–0.1)
Basophils Relative: 0 %
Eosinophils Absolute: 0 10*3/uL (ref 0.0–0.5)
Eosinophils Relative: 1 %
HCT: 29.6 % — ABNORMAL LOW (ref 39.0–52.0)
Hemoglobin: 9.8 g/dL — ABNORMAL LOW (ref 13.0–17.0)
Immature Granulocytes: 1 %
Lymphocytes Relative: 26 %
Lymphs Abs: 1.3 10*3/uL (ref 0.7–4.0)
MCH: 28.8 pg (ref 26.0–34.0)
MCHC: 33.1 g/dL (ref 30.0–36.0)
MCV: 87.1 fL (ref 80.0–100.0)
Monocytes Absolute: 0.5 10*3/uL (ref 0.1–1.0)
Monocytes Relative: 10 %
Neutro Abs: 3.2 10*3/uL (ref 1.7–7.7)
Neutrophils Relative %: 62 %
Platelets: 181 10*3/uL (ref 150–400)
RBC: 3.4 MIL/uL — ABNORMAL LOW (ref 4.22–5.81)
RDW: 14.4 % (ref 11.5–15.5)
WBC: 5.1 10*3/uL (ref 4.0–10.5)
nRBC: 0 % (ref 0.0–0.2)

## 2023-02-03 DIAGNOSIS — N1831 Chronic kidney disease, stage 3a: Secondary | ICD-10-CM | POA: Diagnosis not present

## 2023-02-03 DIAGNOSIS — R54 Age-related physical debility: Secondary | ICD-10-CM | POA: Diagnosis not present

## 2023-02-03 DIAGNOSIS — I10 Essential (primary) hypertension: Secondary | ICD-10-CM | POA: Diagnosis not present

## 2023-02-03 DIAGNOSIS — Z01811 Encounter for preprocedural respiratory examination: Secondary | ICD-10-CM | POA: Diagnosis not present

## 2023-02-03 DIAGNOSIS — E1100 Type 2 diabetes mellitus with hyperosmolarity without nonketotic hyperglycemic-hyperosmolar coma (NKHHC): Secondary | ICD-10-CM | POA: Diagnosis not present

## 2023-02-03 DIAGNOSIS — R911 Solitary pulmonary nodule: Secondary | ICD-10-CM | POA: Diagnosis not present

## 2023-02-03 DIAGNOSIS — I129 Hypertensive chronic kidney disease with stage 1 through stage 4 chronic kidney disease, or unspecified chronic kidney disease: Secondary | ICD-10-CM | POA: Diagnosis not present

## 2023-02-03 DIAGNOSIS — Z7901 Long term (current) use of anticoagulants: Secondary | ICD-10-CM | POA: Diagnosis not present

## 2023-02-03 DIAGNOSIS — E1122 Type 2 diabetes mellitus with diabetic chronic kidney disease: Secondary | ICD-10-CM | POA: Diagnosis not present

## 2023-02-03 DIAGNOSIS — R918 Other nonspecific abnormal finding of lung field: Secondary | ICD-10-CM | POA: Diagnosis not present

## 2023-02-03 DIAGNOSIS — E782 Mixed hyperlipidemia: Secondary | ICD-10-CM | POA: Diagnosis not present

## 2023-02-03 DIAGNOSIS — D649 Anemia, unspecified: Secondary | ICD-10-CM | POA: Diagnosis not present

## 2023-02-03 DIAGNOSIS — Z01818 Encounter for other preprocedural examination: Secondary | ICD-10-CM | POA: Diagnosis not present

## 2023-02-10 DIAGNOSIS — Z7984 Long term (current) use of oral hypoglycemic drugs: Secondary | ICD-10-CM | POA: Diagnosis not present

## 2023-02-10 DIAGNOSIS — E782 Mixed hyperlipidemia: Secondary | ICD-10-CM | POA: Diagnosis not present

## 2023-02-10 DIAGNOSIS — Z79899 Other long term (current) drug therapy: Secondary | ICD-10-CM | POA: Diagnosis not present

## 2023-02-10 DIAGNOSIS — Z7982 Long term (current) use of aspirin: Secondary | ICD-10-CM | POA: Diagnosis not present

## 2023-02-10 DIAGNOSIS — I129 Hypertensive chronic kidney disease with stage 1 through stage 4 chronic kidney disease, or unspecified chronic kidney disease: Secondary | ICD-10-CM | POA: Diagnosis not present

## 2023-02-10 DIAGNOSIS — E1122 Type 2 diabetes mellitus with diabetic chronic kidney disease: Secondary | ICD-10-CM | POA: Diagnosis not present

## 2023-02-10 DIAGNOSIS — Z7985 Long-term (current) use of injectable non-insulin antidiabetic drugs: Secondary | ICD-10-CM | POA: Diagnosis not present

## 2023-02-10 DIAGNOSIS — R918 Other nonspecific abnormal finding of lung field: Secondary | ICD-10-CM | POA: Diagnosis not present

## 2023-02-10 DIAGNOSIS — R846 Abnormal cytological findings in specimens from respiratory organs and thorax: Secondary | ICD-10-CM | POA: Diagnosis not present

## 2023-02-10 DIAGNOSIS — R911 Solitary pulmonary nodule: Secondary | ICD-10-CM | POA: Diagnosis not present

## 2023-02-10 DIAGNOSIS — C3412 Malignant neoplasm of upper lobe, left bronchus or lung: Secondary | ICD-10-CM | POA: Diagnosis not present

## 2023-02-10 DIAGNOSIS — N1831 Chronic kidney disease, stage 3a: Secondary | ICD-10-CM | POA: Diagnosis not present

## 2023-02-10 DIAGNOSIS — D631 Anemia in chronic kidney disease: Secondary | ICD-10-CM | POA: Diagnosis not present

## 2023-02-10 DIAGNOSIS — Z8616 Personal history of COVID-19: Secondary | ICD-10-CM | POA: Diagnosis not present

## 2023-02-10 DIAGNOSIS — J989 Respiratory disorder, unspecified: Secondary | ICD-10-CM | POA: Diagnosis not present

## 2023-02-12 ENCOUNTER — Other Ambulatory Visit: Payer: Self-pay | Admitting: Internal Medicine

## 2023-02-13 ENCOUNTER — Encounter: Payer: Self-pay | Admitting: Hematology and Oncology

## 2023-02-13 ENCOUNTER — Other Ambulatory Visit: Payer: Self-pay

## 2023-02-13 ENCOUNTER — Inpatient Hospital Stay: Payer: Medicare Other | Admitting: Hematology and Oncology

## 2023-02-13 VITALS — BP 166/70 | HR 57 | Temp 97.7°F | Resp 18 | Ht 75.0 in | Wt 245.6 lb

## 2023-02-13 DIAGNOSIS — C3412 Malignant neoplasm of upper lobe, left bronchus or lung: Secondary | ICD-10-CM | POA: Diagnosis not present

## 2023-02-13 DIAGNOSIS — R918 Other nonspecific abnormal finding of lung field: Secondary | ICD-10-CM | POA: Diagnosis not present

## 2023-02-13 DIAGNOSIS — D472 Monoclonal gammopathy: Secondary | ICD-10-CM

## 2023-02-13 NOTE — Assessment & Plan Note (Signed)
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

## 2023-02-13 NOTE — Progress Notes (Signed)
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 He does not need treatment right now Recommend close monitoring and follow-up in 3 months  Abnormal CT scan of lung I have reviewed documentation and test results from [facility] Recent biopsy from the left upper portion of his lung confirmed diagnosis of mucinous adenocarcinoma Based on recent PET/CT imaging, the patient is a candidate for primary resection He is awaiting phone call from [facility] next week He is interested to have surgery at [facility] in the near future I plan to see him back in 3 months to follow-up on smoldering myeloma but if the patient undergo surgery for his lung cancer in the near future, I will see him back sooner  No orders of the defined types were placed in this encounter.   All questions were answered. The patient knows to call the clinic with any problems, questions or concerns. The total time spent in the appointment was 40 minutes encounter with patients including review of chart and various tests results, discussions about plan of care and coordination of care plan   Artis Delay, MD 02/13/2023 2:42 PM  INTERVAL HISTORY: Please see below for problem oriented charting. he returns for follow-up and review test results We spent majority of our time reviewing test results for smoldering myeloma as well as results from [facility] From the smoldering myeloma standpoint, he is not symptomatic and does not need treatment Pathology from recent bronchoscopy and biopsy from [facility] confirm mucinous adenocarcinoma He denies significant cough, chest pain or shortness of breath since his procedure  REVIEW OF SYSTEMS:   Constitutional: Denies  fevers, chills or abnormal weight loss Eyes: Denies blurriness of vision Ears, nose, mouth, throat, and face: Denies mucositis or sore throat Respiratory: Denies cough, dyspnea or wheezes Cardiovascular: Denies palpitation, chest discomfort or lower extremity swelling Gastrointestinal:  Denies nausea, heartburn or change in bowel habits Skin: Denies abnormal skin rashes Lymphatics: Denies new lymphadenopathy or easy bruising Neurological:Denies numbness, tingling or new weaknesses Behavioral/Psych: Mood is stable, no new changes  All other systems were reviewed with the patient and are negative.  I have reviewed the past medical history, past surgical history, social history and family history with the patient and they are unchanged from previous note.  ALLERGIES:  has No Known Allergies.  MEDICATIONS:  Current Outpatient Medications  Medication Sig Dispense Refill   amLODipine (NORVASC) 10 MG tablet TAKE 1 TABLET BY MOUTH EVERY DAY 90 tablet 1   atorvastatin (LIPITOR) 40 MG tablet TAKE 1 TABLET BY MOUTH EVERY DAY 90 tablet 1   cyanocobalamin (VITAMIN B12) 1000 MCG/ML injection INJECT 1 ML (1,000 MCG TOTAL) INTO THE MUSCLE EVERY 30 DAYS. 9 mL 1   Dulaglutide 1.5 MG/0.5ML SOPN Inject 1.5 mg into the skin once a week. 6 mL 2   enalapril (VASOTEC) 10 MG tablet TAKE 1 TABLET BY MOUTH EVERY DAY 90 tablet 2   glucose blood (ONETOUCH VERIO) test strip Use as instructed to check blood sugars twice daily E11.69 100 each 2   glucose blood test strip Use as instructed to test blood sugar 3 times a day. Dx code: e11.65 100 each 2   Insulin Glargine ([name] KWIKPEN) 100 UNIT/ML Inject 0.22 mLs (22 Units  total) into the skin at bedtime. (Patient taking differently: Inject 11 Units into the skin at bedtime.) 45 mL 3   metFORMIN (GLUCOPHAGE) 1000 MG tablet TAKE 1 TABLET BY MOUTH TWICE A DAY WITH FOOD 180 tablet 0   sildenafil (VIAGRA) 100 MG tablet TAKE 1 TABLET BY MOUTH EVERY DAY AS NEEDED (INSURANCE  COVERS 10 TAB PER 77DAYS) 10 tablet 5   SYRINGE-NEEDLE, DISP, 3 ML (B-D INTEGRA SYRINGE) 25G X 5/8" 3 ML MISC USE AS DIRECTED 12 each 24   No current facility-administered medications for this visit.    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 No lytic or destructive lesions within the bones. 2. 2.9 cm nodular opacity in the hilar region on the left, unchanged from recent CT. 3. Scattered degenerative changes.   06/02/2022 Bone Marrow Biopsy   BONE MARROW, ASPIRATE, CLOT, CORE: -Variably cellular bone marrow involved by plasma cell neoplasm (10% plasma cells by manual aspirate differential, approximately 10 to 15% by CD138 immunohistochemical stains on clot and core sections, and kappa restricted by kappa/lambda in situ hybridization.).  See comment.  PERIPHERAL BLOOD: -Normocytic normochromic anemia  COMMENT:   Morphological evaluation of the bone marrow clot sections reveals multiple lymphoid aggregates in addition to increased plasma cells.  CD3 and CD20 stains are attempted, however the lymphoid aggregates are lost on deeper levels with a single lymphoid aggregate showing slightly more CD20 staining than CD3.  While flow cytometry is negative for a clonal B-cell population, close clinical and imaging follow-up is recommended along with assessment of peripheral blood and potential sites of lymphadenopathy/organomegaly as clinically indicated.   MICROSCOPIC DESCRIPTION:  PERIPHERAL BLOOD SMEAR: Platelets: Adequate, no platelet clumps identified Erythroid: Normocytic normochromic anemia Leukocytes: Adequate, negative for dysplastic granulocytes, blasts or plasma cells.  Plasmacytoid lymphocytes present.  BONE MARROW ASPIRATE: Cellular Erythroid precursors: Erythroid precursors show a full sequence of generally orderly maturation Granulocytic precursors: Granulocytic precursors show full  sequence of generally orderly maturation Megakaryocytes: Typical in number and morphology Lymphocytes/plasma cells: Plasma cells are increased  TOUCH PREPARATIONS: Confirmatory of the aspirate findings  CLOT AND BIOPSY: The bone marrow clot and biopsy reveal a variably cellular bone marrow (20 to 60%) with trilineage hematopoiesis.  Plasma cells are increased and present singly scattered and in clusters.  Also present are loosely circumscribed lymphoid aggregates with small mature lymphocytes.  The findings are confirmatory of the aspirate and touch prep impression.  SPECIAL stains: CD138: CD138 highlights plasma cells, approximately 10% of cellularity in clot and core sections Kappa/lambda ISH: Kappa/lambda ISH shows kappa restricted plasma cells CD3: CD3 highlights T lymphocytes in the lymphoid aggregates CD20: CD20 highlights B lymphocytes in lymphoid aggregates (slightly more than CD3) CD56: CD56 is positive in the plasma cells MUM 1: MUM1 is positive in the plasma cells IRON STAIN: Iron stains are performed on a bone marrow aspirate or touch imprint smear and section of clot. The controls stained appropriately.       Storage Iron: Scant      Ring Sideroblasts: Not identified  ADDITIONAL DATA/TESTING: Multiple myeloma panel  CELL COUNT DATA:  Bone Marrow count performed on 500 cells shows: Blasts:   0%   Myeloid:  43% Promyelocytes: 0%   Erythroid:     37% Myelocytes:    5%   Lymphocytes:   7% Metamyelocytes:     2%   Plasma cells:  10% Bands:    4% Neutrophils:  30%  M:E ratio:     1.16 Eosinophils:   2% Basophils:     0% Monocytes:     3%      PHYSICAL EXAMINATION: ECOG PERFORMANCE STATUS: 1 - Symptomatic but completely ambulatory  Vitals:   02/13/23 1130  BP: (!) 166/70  Pulse: (!) 57  Resp: 18  Temp: 97.7 F (36.5 C)  SpO2: 100%   Filed Weights   02/13/23 1130  Weight: 245 lb 9.6 oz (111.4 kg)    GENERAL:alert, no distress and comfortable NEURO:  alert & oriented x 3 with fluent speech, no focal motor/sensory deficits  LABORATORY DATA:  I have reviewed the data as listed    Component Value Date/Time   NA 138 02/02/2023 1252   NA 137 11/05/2022 1152   K 3.9 02/02/2023 1252   CL 106 02/02/2023 1252   CO2 26 02/02/2023 1252   GLUCOSE 167 (H) 02/02/2023 1252   BUN 12 02/02/2023 1252   BUN 11 11/05/2022 1152   CREATININE 1.43 (H) 02/02/2023 1252   CREATININE 1.14 08/19/2022 1314   CALCIUM 9.0 02/02/2023 1252   PROT 7.7 02/02/2023 1252   PROT 7.7 08/18/2022 1518   ALBUMIN 3.8 02/02/2023 1252   ALBUMIN 4.3 08/18/2022 1518   AST 22 02/02/2023 1252   AST 22 08/19/2022 1314   ALT 17 02/02/2023 1252   ALT 16 08/19/2022 1314   ALKPHOS 83 02/02/2023 1252   BILITOT 0.4 02/02/2023 1252   BILITOT 0.5 08/19/2022 1314   GFRNONAA 53 (L) 02/02/2023 1252   GFRNONAA >60 08/19/2022 1314   GFRAA 70 08/06/2020 1044    No results found for: "SPEP", "UPEP"  Lab Results  Component Value Date   WBC 5.1 02/02/2023   NEUTROABS 3.2 02/02/2023   HGB 9.8 (L) 02/02/2023   HCT 29.6 (L) 02/02/2023   MCV 87.1 02/02/2023   PLT 181 02/02/2023      Chemistry      Component Value Date/Time   NA 138 02/02/2023 1252   NA 137 11/05/2022 1152   K 3.9 02/02/2023 1252   CL 106 02/02/2023 1252   CO2 26 02/02/2023 1252   BUN 12 02/02/2023 1252   BUN 11 11/05/2022 1152   CREATININE 1.43 (H) 02/02/2023 1252   CREATININE 1.14 08/19/2022 1314      Component Value Date/Time   CALCIUM 9.0 02/02/2023 1252   ALKPHOS 83 02/02/2023 1252   AST 22 02/02/2023 1252   AST 22 08/19/2022 1314   ALT 17 02/02/2023 1252   ALT 16 08/19/2022 1314   BILITOT 0.4 02/02/2023 1252   BILITOT 0.5 08/19/2022 1314

## 2023-02-13 NOTE — Assessment & Plan Note (Signed)
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

## 2023-02-16 ENCOUNTER — Encounter: Payer: Self-pay | Admitting: Hematology and Oncology

## 2023-02-17 ENCOUNTER — Encounter: Payer: Self-pay | Admitting: Hematology and Oncology

## 2023-02-17 ENCOUNTER — Other Ambulatory Visit: Payer: Self-pay | Admitting: Hematology and Oncology

## 2023-02-17 ENCOUNTER — Ambulatory Visit (HOSPITAL_BASED_OUTPATIENT_CLINIC_OR_DEPARTMENT_OTHER): Payer: Medicare Other | Admitting: Hematology and Oncology

## 2023-02-17 ENCOUNTER — Telehealth: Payer: Self-pay

## 2023-02-17 DIAGNOSIS — C3492 Malignant neoplasm of unspecified part of left bronchus or lung: Secondary | ICD-10-CM

## 2023-02-17 LAB — ACID FAST CULTURE WITH REFLEXED SENSITIVITIES (MYCOBACTERIA)

## 2023-02-17 NOTE — Assessment & Plan Note (Signed)
I addressed his concerns related to referral to see local thoracic surgeon and possible future referral to see Dr. Arbutus Ped who specializes in lung cancer The patient will check with his insurance to make sure that his physician that he requests is within network He has appointment to follow-up with [facility] as scheduled

## 2023-02-17 NOTE — Progress Notes (Signed)
HEMATOLOGY-ONCOLOGY ELECTRONIC VISIT PROGRESS NOTE  Patient Care Team: [name], MD as PCP - General (Internal Medicine)  I connected with the patient via telephone conference and verified that I am speaking with the correct person using two identifiers. The patient's location is at home and I am providing care from the [facility] I discussed the limitations, risks, security and privacy concerns of performing an evaluation and management service by e-visits and the availability of in person appointments.  I also discussed with the patient that there may be a patient responsible charge related to this service. The patient expressed understanding and agreed to proceed.   ASSESSMENT & PLAN:  Adenocarcinoma of left lung (HCC) I addressed his concerns related to referral to see local thoracic surgeon and possible future referral to see Dr. Arbutus Ped who specializes in lung cancer The patient will check with his insurance to make sure that his physician that he requests is within network He has appointment to follow-up with [facility] as scheduled  No orders of the defined types were placed in this encounter.   INTERVAL HISTORY: Please see below for problem oriented charting. The purpose of today's discussion is to discuss potential referral to local physicians He inquired about possible surgery locally in [place] or [place] He is wondering whether he should see a lung cancer specialist in the future He has follow-up at [facility] soon  SUMMARY OF ONCOLOGIC HISTORY: Oncology History Overview Note  Normal cytogenetics   Adenocarcinoma of left lung (HCC)  10/14/2020 Initial Diagnosis   Adenocarcinoma of left lung (HCC)   02/17/2023 Cancer Staging   Staging form: Lung, AJCC 8th Edition - Clinical stage from 02/17/2023: Stage IIA (cT2b, cN0, cM0) - Signed by Artis Delay, MD on 02/17/2023 Stage prefix: Initial diagnosis   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 No lytic or destructive  lesions within the bones. 2. 2.9 cm nodular opacity in the hilar region on the left, unchanged from recent CT. 3. Scattered degenerative changes.   06/02/2022 Bone Marrow Biopsy   BONE MARROW, ASPIRATE, CLOT, CORE: -Variably cellular bone marrow involved by plasma cell neoplasm (10% plasma cells by manual aspirate differential, approximately 10 to 15% by CD138 immunohistochemical stains on clot and core sections, and kappa restricted by kappa/lambda in situ hybridization.).  See comment.  PERIPHERAL BLOOD: -Normocytic normochromic anemia  COMMENT:   Morphological evaluation of the bone marrow clot sections reveals multiple lymphoid aggregates in addition to increased plasma cells.  CD3 and CD20 stains are attempted, however the lymphoid aggregates are lost on deeper levels with a single lymphoid aggregate showing slightly more CD20 staining than CD3.  While flow cytometry is negative for a clonal B-cell population, close clinical and imaging follow-up is recommended along with assessment of peripheral blood and potential sites of lymphadenopathy/organomegaly as clinically indicated.   MICROSCOPIC DESCRIPTION:  PERIPHERAL BLOOD SMEAR: Platelets: Adequate, no platelet clumps identified Erythroid: Normocytic normochromic anemia Leukocytes: Adequate, negative for dysplastic granulocytes, blasts or plasma cells.  Plasmacytoid lymphocytes present.  BONE MARROW ASPIRATE: Cellular Erythroid precursors: Erythroid precursors show a full sequence of generally orderly maturation Granulocytic precursors: Granulocytic precursors show full sequence of generally orderly maturation Megakaryocytes: Typical in number and morphology Lymphocytes/plasma cells: Plasma cells are increased  TOUCH PREPARATIONS: Confirmatory of the aspirate findings  CLOT AND BIOPSY: The bone marrow clot and biopsy reveal a variably cellular bone marrow (20 to 60%) with trilineage hematopoiesis.  Plasma cells are increased and  present singly scattered and in clusters.  Also  present are loosely circumscribed lymphoid aggregates with small mature lymphocytes.  The findings are confirmatory of the aspirate and touch prep impression.  SPECIAL stains: CD138: CD138 highlights plasma cells, approximately 10% of cellularity in clot and core sections Kappa/lambda ISH: Kappa/lambda ISH shows kappa restricted plasma cells CD3: CD3 highlights T lymphocytes in the lymphoid aggregates CD20: CD20 highlights B lymphocytes in lymphoid aggregates (slightly more than CD3) CD56: CD56 is positive in the plasma cells MUM 1: MUM1 is positive in the plasma cells IRON STAIN: Iron stains are performed on a bone marrow aspirate or touch imprint smear and section of clot. The controls stained appropriately.       Storage Iron: Scant      Ring Sideroblasts: Not identified  ADDITIONAL DATA/TESTING: Multiple myeloma panel  CELL COUNT DATA:  Bone Marrow count performed on 500 cells shows: Blasts:   0%   Myeloid:  43% Promyelocytes: 0%   Erythroid:     37% Myelocytes:    5%   Lymphocytes:   7% Metamyelocytes:     2%   Plasma cells:  10% Bands:    4% Neutrophils:   30%  M:E ratio:     1.16 Eosinophils:   2% Basophils:     0% Monocytes:     3%      REVIEW OF SYSTEMS:   Constitutional: Denies fevers, chills or abnormal weight loss Eyes: Denies blurriness of vision Ears, nose, mouth, throat, and face: Denies mucositis or sore throat Respiratory: Denies cough, dyspnea or wheezes Cardiovascular: Denies palpitation, chest discomfort Gastrointestinal:  Denies nausea, heartburn or change in bowel habits Skin: Denies abnormal skin rashes Lymphatics: Denies new lymphadenopathy or easy bruising Neurological:Denies numbness, tingling or new weaknesses Behavioral/Psych: Mood is stable, no new changes  Extremities: No lower extremity edema All other systems were reviewed with the patient and are negative.  I have reviewed the past medical  history, past surgical history, social history and family history with the patient and they are unchanged from previous note.  ALLERGIES:  has No Known Allergies.  MEDICATIONS:  Current Outpatient Medications  Medication Sig Dispense Refill   amLODipine (NORVASC) 10 MG tablet TAKE 1 TABLET BY MOUTH EVERY DAY 90 tablet 1   atorvastatin (LIPITOR) 40 MG tablet TAKE 1 TABLET BY MOUTH EVERY DAY 90 tablet 1   cyanocobalamin (VITAMIN B12) 1000 MCG/ML injection INJECT 1 ML (1,000 MCG TOTAL) INTO THE MUSCLE EVERY 30 DAYS. 9 mL 1   Dulaglutide 1.5 MG/0.5ML SOPN Inject 1.5 mg into the skin once a week. 6 mL 2   enalapril (VASOTEC) 10 MG tablet TAKE 1 TABLET BY MOUTH EVERY DAY 90 tablet 2   glucose blood (ONETOUCH VERIO) test strip Use as instructed to check blood sugars twice daily E11.69 100 each 2   glucose blood test strip Use as instructed to test blood sugar 3 times a day. Dx code: e11.65 100 each 2   Insulin Glargine ([name] KWIKPEN) 100 UNIT/ML Inject 0.22 mLs (22 Units total) into the skin at bedtime. (Patient taking differently: Inject 11 Units into the skin at bedtime.) 45 mL 3   metFORMIN (GLUCOPHAGE) 1000 MG tablet TAKE 1 TABLET BY MOUTH TWICE A DAY WITH FOOD 180 tablet 0   sildenafil (VIAGRA) 100 MG tablet TAKE 1 TABLET BY MOUTH EVERY DAY AS NEEDED (INSURANCE COVERS 10 TAB PER 77DAYS) 10 tablet 5   SYRINGE-NEEDLE, DISP, 3 ML (B-D INTEGRA SYRINGE) 25G X 5/8" 3 ML MISC USE AS DIRECTED 12 each 24  No current facility-administered medications for this visit.    PHYSICAL EXAMINATION: ECOG PERFORMANCE STATUS: 0 - Asymptomatic  LABORATORY DATA:  I have reviewed the data as listed    Latest Ref Rng & Units 02/02/2023   12:52 PM 01/05/2023    9:17 AM 11/18/2022   10:09 AM  CMP  Glucose 70 - 99 mg/dL 366  440  347   BUN 8 - 23 mg/dL 12  20  14    Creatinine 0.61 - 1.24 mg/dL 4.25  9.56  3.87   Sodium 135 - 145 mmol/L 138  140  136   Potassium 3.5 - 5.1 mmol/L 3.9  4.2  4.2   Chloride 98  - 111 mmol/L 106  107  105   CO2 22 - 32 mmol/L 26   26   Calcium 8.9 - 10.3 mg/dL 9.0   9.2   Total Protein 6.5 - 8.1 g/dL 7.7   8.0   Total Bilirubin 0.3 - 1.2 mg/dL 0.4   0.4   Alkaline Phos 38 - 126 U/L 83   78   AST 15 - 41 U/L 22   26   ALT 0 - 44 U/L 17   20     Lab Results  Component Value Date   WBC 5.1 02/02/2023   HGB 9.8 (L) 02/02/2023   HCT 29.6 (L) 02/02/2023   MCV 87.1 02/02/2023   PLT 181 02/02/2023   NEUTROABS 3.2 02/02/2023     I discussed the assessment and treatment plan with the patient. The patient was provided an opportunity to ask questions and all were answered. The patient agreed with the plan and demonstrated an understanding of the instructions. The patient was advised to call back or seek an in-person evaluation if the symptoms worsen or if the condition fails to improve as anticipated.    I spent 20 minutes for the appointment reviewing test results, discuss management and coordination of care.  Artis Delay, MD 02/17/2023 1:45 PM

## 2023-02-17 NOTE — Telephone Encounter (Signed)
-----   Message from Artis Delay sent at 02/17/2023  9:40 AM EDT ----- Looks like he sent a mychart message Can you call him and scehdule phone visit at 12?

## 2023-02-17 NOTE — Telephone Encounter (Signed)
Called and scheduled appt for today at 1200. Virtual visit added and he is aware of time.

## 2023-02-18 ENCOUNTER — Other Ambulatory Visit: Payer: Self-pay | Admitting: Hematology and Oncology

## 2023-02-18 DIAGNOSIS — C3492 Malignant neoplasm of unspecified part of left bronchus or lung: Secondary | ICD-10-CM

## 2023-02-19 ENCOUNTER — Telehealth: Payer: Self-pay | Admitting: *Deleted

## 2023-02-19 NOTE — Telephone Encounter (Signed)
Contacted patient to confirm he had been notified of date and time of PFT appt - 02/24/23 @ 9 am at [facility]. Patient stated someone called he and his wife with the appt details.

## 2023-02-24 ENCOUNTER — Ambulatory Visit (HOSPITAL_COMMUNITY)
Admission: RE | Admit: 2023-02-24 | Discharge: 2023-02-24 | Disposition: A | Discharge status: Home / Self Care | Payer: Medicare Other | Source: Ambulatory Visit | Attending: Hematology and Oncology | Admitting: Hematology and Oncology

## 2023-02-24 DIAGNOSIS — R059 Cough, unspecified: Secondary | ICD-10-CM | POA: Insufficient documentation

## 2023-02-24 DIAGNOSIS — C3492 Malignant neoplasm of unspecified part of left bronchus or lung: Secondary | ICD-10-CM | POA: Insufficient documentation

## 2023-02-24 LAB — PULMONARY FUNCTION TEST
DL/VA % pred: 96 %
DL/VA: 3.82 ml/min/mmHg/L
DLCO unc % pred: 74 %
DLCO unc: 22.6 ml/min/mmHg
FEF 25-75 Post: 4.14 L/s
FEF 25-75 Pre: 4.23 L/s
FEF2575-%Change-Post: -2 %
FEF2575-%Pred-Post: 137 %
FEF2575-%Pred-Pre: 140 %
FEV1-%Change-Post: 0 %
FEV1-%Pred-Post: 91 %
FEV1-%Pred-Pre: 91 %
FEV1-Post: 3.63 L
FEV1-Pre: 3.62 L
FEV1FVC-%Change-Post: 3 %
FEV1FVC-%Pred-Pre: 113 %
FEV6-%Change-Post: -3 %
FEV6-%Pred-Post: 81 %
FEV6-%Pred-Pre: 84 %
FEV6-Post: 4.15 L
FEV6-Pre: 4.29 L
FEV6FVC-%Change-Post: 0 %
FEV6FVC-%Pred-Post: 105 %
FEV6FVC-%Pred-Pre: 104 %
FVC-%Change-Post: -2 %
FVC-%Pred-Post: 79 %
FVC-%Pred-Pre: 81 %
FVC-Post: 4.22 L
FVC-Pre: 4.34 L
Post FEV1/FVC ratio: 86 %
Post FEV6/FVC ratio: 100 %
Pre FEV1/FVC ratio: 83 %
Pre FEV6/FVC Ratio: 99 %
RV % pred: 67 %
RV: 1.82 L
TLC % pred: 75 %
TLC: 6.08 L

## 2023-02-24 MED ORDER — ALBUTEROL SULFATE (2.5 MG/3ML) 0.083% IN NEBU
2.5000 mg | INHALATION_SOLUTION | Freq: Once | RESPIRATORY_TRACT | Status: AC
Start: 2023-02-24 — End: 2023-02-24
  Administered 2023-02-24: 2.5 mg via RESPIRATORY_TRACT

## 2023-02-26 NOTE — H&P (View-Only) (Signed)
[address]       [place] [address]             [phone]                    Trevion Baser [facility] Medical Record #[id number] Date of Birth: 05/30/1956  Referring: Artis Delay, MD Primary Care: [name], MD Primary Cardiologist: None  Chief Complaint:    Chief Complaint  Patient presents with   Lung Cancer    Chest CT 6/3, PET 6/18, Bronch 7/15, PFTs 9/3    History of Present Illness:    Deyvi Ficco 67 y.o. 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  He initially was followed for multiple myeloma and was found to have this lesion.  He has been asymptomatic.  He denies any neurologic symptoms, respiratory symptoms, or weight changes.  He is a lifelong non-smoker and denies any secondhand smoke.     Past Medical History:  Diagnosis Date   Diabetes mellitus    High cholesterol    Hypertension     Past Surgical History:  Procedure Laterality Date   BRONCHIAL BIOPSY  01/05/2023   Procedure: BRONCHIAL BIOPSIES;  Surgeon: Josephine Igo, DO;  Location: [facility];  Service: Pulmonary;;   BRONCHIAL BRUSHINGS  01/05/2023   Procedure: BRONCHIAL BRUSHINGS;  Surgeon: Josephine Igo, DO;  Location: [facility];  Service: Pulmonary;;   BRONCHIAL NEEDLE ASPIRATION BIOPSY  01/05/2023   Procedure: BRONCHIAL NEEDLE ASPIRATION BIOPSIES;  Surgeon: Josephine Igo, DO;  Location: [facility];  Service: Pulmonary;;   KNEE SURGERY Left 2004   torn cartilage   PATELLAR TENDON REPAIR  06/11/2011   Procedure: PATELLA TENDON REPAIR;  Surgeon: Cammy Copa;  Location: [facility];  Service: Orthopedics;  Laterality: Right;    Family History  Problem Relation Age of Onset   Hypertension Mother    Multiple myeloma Mother    Hypertension Father    Diabetes Father      Social History   Tobacco Use  Smoking Status Never  Smokeless Tobacco Never  Tobacco Comments   n/a    Social  History   Substance and Sexual Activity  Alcohol Use No     No Known Allergies  Current Outpatient Medications  Medication Sig Dispense Refill   amLODipine (NORVASC) 10 MG tablet TAKE 1 TABLET BY MOUTH EVERY DAY 90 tablet 1   atorvastatin (LIPITOR) 40 MG tablet TAKE 1 TABLET BY MOUTH EVERY DAY 90 tablet 1   cyanocobalamin (VITAMIN B12) 1000 MCG/ML injection INJECT 1 ML (1,000 MCG TOTAL) INTO THE MUSCLE EVERY 30 DAYS. 9 mL 1   Dulaglutide 1.5 MG/0.5ML SOPN Inject 1.5 mg into the skin once a week. 6 mL 2   enalapril (VASOTEC) 10 MG tablet TAKE 1 TABLET BY MOUTH EVERY DAY 90 tablet 2   glucose blood (ONETOUCH VERIO) test strip Use as instructed to check blood sugars twice daily E11.69 100 each 2   glucose blood test strip Use as instructed to test blood sugar 3 times a day. Dx code: e11.65 100 each 2   Insulin Glargine ([name] KWIKPEN) 100 UNIT/ML Inject 0.22 mLs (22 Units total) into the skin at bedtime. (Patient taking differently: Inject 11 Units into the skin at bedtime.) 45 mL 3   metFORMIN (GLUCOPHAGE) 1000 MG tablet TAKE 1 TABLET BY MOUTH TWICE A DAY WITH FOOD 180 tablet 0   sildenafil (  VIAGRA) 100 MG tablet TAKE 1 TABLET BY MOUTH EVERY DAY AS NEEDED (INSURANCE COVERS 10 TAB PER 77DAYS) 10 tablet 5   SYRINGE-NEEDLE, DISP, 3 ML (B-D INTEGRA SYRINGE) 25G X 5/8" 3 ML MISC USE AS DIRECTED 12 each 24   No current facility-administered medications for this visit.    Review of Systems  Constitutional:  Negative for fever, malaise/fatigue and weight loss.  Respiratory:  Negative for cough and shortness of breath.   Cardiovascular:  Negative for chest pain.  Neurological: Negative.      PHYSICAL EXAMINATION: BP (!) 160/82 (BP Location: Right Arm, Patient Position: Sitting)   Pulse (!) 57   Resp 18   Ht 6\' 3"  (1.905 m)   Wt 245 lb (111.1 kg)   SpO2 98% Comment: Ra  BMI 30.62 kg/m  Physical Exam Constitutional:      General: He is not in acute distress.    Appearance: Normal  appearance. He is normal weight. He is not ill-appearing.  HENT:     Head: Normocephalic and atraumatic.  Eyes:     Extraocular Movements: Extraocular movements intact.  Cardiovascular:     Rate and Rhythm: Bradycardia present.  Pulmonary:     Effort: Pulmonary effort is normal. No respiratory distress.  Abdominal:     General: Abdomen is flat. There is no distension.  Musculoskeletal:        General: Normal range of motion.     Cervical back: Normal range of motion.  Skin:    General: Skin is warm and dry.  Neurological:     General: No focal deficit present.     Mental Status: He is alert and oriented to person, place, and time.          I have independently reviewed the above radiology studies  and reviewed the findings with the patient.   Recent Lab Findings: Lab Results  Component Value Date   WBC 5.1 02/02/2023   HGB 9.8 (L) 02/02/2023   HCT 29.6 (L) 02/02/2023   PLT 181 02/02/2023   GLUCOSE 167 (H) 02/02/2023   CHOL 139 08/18/2022   TRIG 77 08/18/2022   HDL 50 08/18/2022   LDLCALC 74 08/18/2022   ALT 17 02/02/2023   AST 22 02/02/2023   NA 138 02/02/2023   K 3.9 02/02/2023   CL 106 02/02/2023   CREATININE 1.43 (H) 02/02/2023   BUN 12 02/02/2023   CO2 26 02/02/2023   TSH 1.650 08/18/2022   INR 1.32 06/13/2011   HGBA1C 7.4 (H) 11/05/2022    Diagnostic Studies & Laboratory data:     Recent Radiology Findings:   No results found.   PFTs:  - FVC: 81% - FEV1: 91% -DLCO: 74%  FINAL MICROSCOPIC DIAGNOSIS:   A. LUNG, LUL, FINE NEEDLE ASPIRATION:  - No malignant cells identified   B. LUNG, LUL, BRUSH:  - Nondiagnostic materia   PET CT 6/24 IMPRESSION: Mixed density nodule in the left upper lobe/lingula demonstrates focal hypermetabolism, suggesting that at least a portion of this lesion reflects primary bronchogenic carcinoma, likely semi invasive adenocarcinoma.   Additional focal hypermetabolism in the superior segment left lower lobe  without corresponding nodule. Attention to this region on follow-up is suggested.   No findings suspicious for metastatic disease.   Benign left adrenal adenoma.     Assessment / Plan:   67yo male with left upper lobe 4cm pulmonary mass.  Previous biopsy has been negative.  He had a second biopsy performed at [facility] was positive  for mucinous adenocarcinoma.  He was originally scheduled to have surgery at [facility], but this physician is not in network.    Of note on the PET scan there is another hypermetabolic area in the left lower lobe but this does not correspond with the specific pulmonary nodule.  I would like to obtain a repeat CT chest for further evaluation of this left lower lobe lesion, as well as an MRI brain given the size of the original tumor.  If both of these are negative then he is agreeable to proceed with a left robotic assisted thoracoscopy with left upper lobectomy.  The risks and benefits have been discussed.   I  spent 40 minutes with  the patient face to face in counseling and coordination of care.    Corliss Skains 02/27/2023 10:48 AM

## 2023-02-26 NOTE — Progress Notes (Signed)
[address]       [place] [address]             [phone]                    Trevion Baser [facility] Medical Record #[id number] Date of Birth: 05/30/1956  Referring: Artis Delay, MD Primary Care: [name], MD Primary Cardiologist: None  Chief Complaint:    Chief Complaint  Patient presents with   Lung Cancer    Chest CT 6/3, PET 6/18, Bronch 7/15, PFTs 9/3    History of Present Illness:    William Mcguire 67 y.o. 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  He initially was followed for multiple myeloma and was found to have this lesion.  He has been asymptomatic.  He denies any neurologic symptoms, respiratory symptoms, or weight changes.  He is a lifelong non-smoker and denies any secondhand smoke.     Past Medical History:  Diagnosis Date   Diabetes mellitus    High cholesterol    Hypertension     Past Surgical History:  Procedure Laterality Date   BRONCHIAL BIOPSY  01/05/2023   Procedure: BRONCHIAL BIOPSIES;  Surgeon: Josephine Igo, DO;  Location: [facility];  Service: Pulmonary;;   BRONCHIAL BRUSHINGS  01/05/2023   Procedure: BRONCHIAL BRUSHINGS;  Surgeon: Josephine Igo, DO;  Location: [facility];  Service: Pulmonary;;   BRONCHIAL NEEDLE ASPIRATION BIOPSY  01/05/2023   Procedure: BRONCHIAL NEEDLE ASPIRATION BIOPSIES;  Surgeon: Josephine Igo, DO;  Location: [facility];  Service: Pulmonary;;   KNEE SURGERY Left 2004   torn cartilage   PATELLAR TENDON REPAIR  06/11/2011   Procedure: PATELLA TENDON REPAIR;  Surgeon: Cammy Copa;  Location: [facility];  Service: Orthopedics;  Laterality: Right;    Family History  Problem Relation Age of Onset   Hypertension Mother    Multiple myeloma Mother    Hypertension Father    Diabetes Father      Social History   Tobacco Use  Smoking Status Never  Smokeless Tobacco Never  Tobacco Comments   n/a    Social  History   Substance and Sexual Activity  Alcohol Use No     No Known Allergies  Current Outpatient Medications  Medication Sig Dispense Refill   amLODipine (NORVASC) 10 MG tablet TAKE 1 TABLET BY MOUTH EVERY DAY 90 tablet 1   atorvastatin (LIPITOR) 40 MG tablet TAKE 1 TABLET BY MOUTH EVERY DAY 90 tablet 1   cyanocobalamin (VITAMIN B12) 1000 MCG/ML injection INJECT 1 ML (1,000 MCG TOTAL) INTO THE MUSCLE EVERY 30 DAYS. 9 mL 1   Dulaglutide 1.5 MG/0.5ML SOPN Inject 1.5 mg into the skin once a week. 6 mL 2   enalapril (VASOTEC) 10 MG tablet TAKE 1 TABLET BY MOUTH EVERY DAY 90 tablet 2   glucose blood (ONETOUCH VERIO) test strip Use as instructed to check blood sugars twice daily E11.69 100 each 2   glucose blood test strip Use as instructed to test blood sugar 3 times a day. Dx code: e11.65 100 each 2   Insulin Glargine ([name] KWIKPEN) 100 UNIT/ML Inject 0.22 mLs (22 Units total) into the skin at bedtime. (Patient taking differently: Inject 11 Units into the skin at bedtime.) 45 mL 3   metFORMIN (GLUCOPHAGE) 1000 MG tablet TAKE 1 TABLET BY MOUTH TWICE A DAY WITH FOOD 180 tablet 0   sildenafil (  VIAGRA) 100 MG tablet TAKE 1 TABLET BY MOUTH EVERY DAY AS NEEDED (INSURANCE COVERS 10 TAB PER 77DAYS) 10 tablet 5   SYRINGE-NEEDLE, DISP, 3 ML (B-D INTEGRA SYRINGE) 25G X 5/8" 3 ML MISC USE AS DIRECTED 12 each 24   No current facility-administered medications for this visit.    Review of Systems  Constitutional:  Negative for fever, malaise/fatigue and weight loss.  Respiratory:  Negative for cough and shortness of breath.   Cardiovascular:  Negative for chest pain.  Neurological: Negative.      PHYSICAL EXAMINATION: BP (!) 160/82 (BP Location: Right Arm, Patient Position: Sitting)   Pulse (!) 57   Resp 18   Ht 6\' 3"  (1.905 m)   Wt 245 lb (111.1 kg)   SpO2 98% Comment: Ra  BMI 30.62 kg/m  Physical Exam Constitutional:      General: He is not in acute distress.    Appearance: Normal  appearance. He is normal weight. He is not ill-appearing.  HENT:     Head: Normocephalic and atraumatic.  Eyes:     Extraocular Movements: Extraocular movements intact.  Cardiovascular:     Rate and Rhythm: Bradycardia present.  Pulmonary:     Effort: Pulmonary effort is normal. No respiratory distress.  Abdominal:     General: Abdomen is flat. There is no distension.  Musculoskeletal:        General: Normal range of motion.     Cervical back: Normal range of motion.  Skin:    General: Skin is warm and dry.  Neurological:     General: No focal deficit present.     Mental Status: He is alert and oriented to person, place, and time.          I have independently reviewed the above radiology studies  and reviewed the findings with the patient.   Recent Lab Findings: Lab Results  Component Value Date   WBC 5.1 02/02/2023   HGB 9.8 (L) 02/02/2023   HCT 29.6 (L) 02/02/2023   PLT 181 02/02/2023   GLUCOSE 167 (H) 02/02/2023   CHOL 139 08/18/2022   TRIG 77 08/18/2022   HDL 50 08/18/2022   LDLCALC 74 08/18/2022   ALT 17 02/02/2023   AST 22 02/02/2023   NA 138 02/02/2023   K 3.9 02/02/2023   CL 106 02/02/2023   CREATININE 1.43 (H) 02/02/2023   BUN 12 02/02/2023   CO2 26 02/02/2023   TSH 1.650 08/18/2022   INR 1.32 06/13/2011   HGBA1C 7.4 (H) 11/05/2022    Diagnostic Studies & Laboratory data:     Recent Radiology Findings:   No results found.   PFTs:  - FVC: 81% - FEV1: 91% -DLCO: 74%  FINAL MICROSCOPIC DIAGNOSIS:   A. LUNG, LUL, FINE NEEDLE ASPIRATION:  - No malignant cells identified   B. LUNG, LUL, BRUSH:  - Nondiagnostic materia   PET CT 6/24 IMPRESSION: Mixed density nodule in the left upper lobe/lingula demonstrates focal hypermetabolism, suggesting that at least a portion of this lesion reflects primary bronchogenic carcinoma, likely semi invasive adenocarcinoma.   Additional focal hypermetabolism in the superior segment left lower lobe  without corresponding nodule. Attention to this region on follow-up is suggested.   No findings suspicious for metastatic disease.   Benign left adrenal adenoma.     Assessment / Plan:   67yo male with left upper lobe 4cm pulmonary mass.  Previous biopsy has been negative.  He had a second biopsy performed at [facility] was positive  for mucinous adenocarcinoma.  He was originally scheduled to have surgery at [facility], but this physician is not in network.    Of note on the PET scan there is another hypermetabolic area in the left lower lobe but this does not correspond with the specific pulmonary nodule.  I would like to obtain a repeat CT chest for further evaluation of this left lower lobe lesion, as well as an MRI brain given the size of the original tumor.  If both of these are negative then he is agreeable to proceed with a left robotic assisted thoracoscopy with left upper lobectomy.  The risks and benefits have been discussed.   I  spent 40 minutes with  the patient face to face in counseling and coordination of care.    William Mcguire 02/27/2023 10:48 AM

## 2023-02-27 ENCOUNTER — Encounter: Payer: Self-pay | Admitting: Thoracic Surgery (Cardiothoracic Vascular Surgery)

## 2023-02-27 ENCOUNTER — Other Ambulatory Visit: Payer: Self-pay | Admitting: *Deleted

## 2023-02-27 ENCOUNTER — Encounter: Payer: Self-pay | Admitting: *Deleted

## 2023-02-27 ENCOUNTER — Institutional Professional Consult (permissible substitution): Payer: Medicare Other | Admitting: Thoracic Surgery (Cardiothoracic Vascular Surgery)

## 2023-02-27 ENCOUNTER — Other Ambulatory Visit: Payer: Self-pay | Admitting: Thoracic Surgery (Cardiothoracic Vascular Surgery)

## 2023-02-27 VITALS — BP 160/82 | HR 57 | Resp 18 | Ht 75.0 in | Wt 245.0 lb

## 2023-02-27 DIAGNOSIS — C3492 Malignant neoplasm of unspecified part of left bronchus or lung: Secondary | ICD-10-CM

## 2023-03-02 ENCOUNTER — Encounter (HOSPITAL_COMMUNITY): Payer: Medicare Other

## 2023-03-02 ENCOUNTER — Telehealth: Payer: Self-pay

## 2023-03-02 NOTE — Telephone Encounter (Signed)
Returned wife's call. She will send a [employer]. She needs a copy of receipt from recent visit. She is not sure of the date.

## 2023-03-04 ENCOUNTER — Ambulatory Visit
Admission: RE | Admit: 2023-03-04 | Discharge: 2023-03-04 | Disposition: A | Discharge status: Home / Self Care | Payer: Medicare Other | Source: Ambulatory Visit | Attending: Thoracic Surgery (Cardiothoracic Vascular Surgery) | Admitting: Thoracic Surgery (Cardiothoracic Vascular Surgery)

## 2023-03-04 DIAGNOSIS — C3492 Malignant neoplasm of unspecified part of left bronchus or lung: Secondary | ICD-10-CM

## 2023-03-04 MED ORDER — GADOPICLENOL 0.5 MMOL/ML IV SOLN
10.0000 mL | Freq: Once | INTRAVENOUS | Status: AC | PRN
  Administered 2023-03-04: 10 mL via INTRAVENOUS

## 2023-03-06 ENCOUNTER — Encounter: Payer: Medicare Other | Admitting: Thoracic Surgery (Cardiothoracic Vascular Surgery)

## 2023-03-06 NOTE — Patient Instructions (Signed)

## 2023-03-10 ENCOUNTER — Ambulatory Visit: Payer: Self-pay | Admitting: Internal Medicine

## 2023-03-10 ENCOUNTER — Encounter: Payer: Self-pay | Admitting: Internal Medicine

## 2023-03-10 VITALS — BP 138/80 | HR 67 | Temp 97.9°F | Ht 75.0 in | Wt 245.6 lb

## 2023-03-10 DIAGNOSIS — Z794 Long term (current) use of insulin: Secondary | ICD-10-CM | POA: Diagnosis not present

## 2023-03-10 DIAGNOSIS — N182 Chronic kidney disease, stage 2 (mild): Secondary | ICD-10-CM | POA: Diagnosis not present

## 2023-03-10 DIAGNOSIS — E6609 Other obesity due to excess calories: Secondary | ICD-10-CM

## 2023-03-10 DIAGNOSIS — E1122 Type 2 diabetes mellitus with diabetic chronic kidney disease: Secondary | ICD-10-CM | POA: Diagnosis not present

## 2023-03-10 DIAGNOSIS — D513 Other dietary vitamin B12 deficiency anemia: Secondary | ICD-10-CM

## 2023-03-10 DIAGNOSIS — I129 Hypertensive chronic kidney disease with stage 1 through stage 4 chronic kidney disease, or unspecified chronic kidney disease: Secondary | ICD-10-CM | POA: Diagnosis not present

## 2023-03-10 DIAGNOSIS — R911 Solitary pulmonary nodule: Secondary | ICD-10-CM

## 2023-03-10 DIAGNOSIS — Z23 Encounter for immunization: Secondary | ICD-10-CM

## 2023-03-10 DIAGNOSIS — Z6830 Body mass index (BMI) 30.0-30.9, adult: Secondary | ICD-10-CM

## 2023-03-10 MED ORDER — BASAGLAR KWIKPEN 100 UNIT/ML ~~LOC~~ SOPN: 21.0000 [IU] | PEN_INJECTOR | Freq: Every day | SUBCUTANEOUS | 1 refills | Status: DC

## 2023-03-10 NOTE — Progress Notes (Signed)
I,William Mcguire, CMA,acting as a Neurosurgeon for William Aliment, MD.,have documented all relevant documentation on the behalf of William Aliment, MD,as directed by  William Aliment, MD while in the presence of William Aliment, MD.  Subjective:  Patient ID: William Mcguire , male    DOB: May 06, 1956 , 67 y.o.   MRN: [id number]  Chief Complaint  Patient presents with   Diabetes   Hypertension   Hyperlipidemia    HPI  He is here today for diabetes/HTN f/u.  He reports compliance with meds. He denies headache, chest pain, SOB. He reports no specific questions or concerns.   He questions if he should get flu shot today since he has upcoming procedure on 9/25.   Diabetes He presents for his follow-up diabetic visit. He has type 2 diabetes mellitus. His disease course has been stable. There are no hypoglycemic associated symptoms. Pertinent negatives for diabetes include no blurred vision, no chest pain, no polydipsia, no polyphagia and no polyuria. There are no hypoglycemic complications. Diabetic complications include nephropathy. Risk factors for coronary artery disease include diabetes mellitus, dyslipidemia, hypertension, male sex and sedentary lifestyle. His breakfast blood glucose is taken between 8-9 am. His breakfast blood glucose range is generally 90-110 mg/dl.  Hypertension This is a chronic problem. The current episode started more than 1 year ago. The problem has been gradually improving since onset. The problem is controlled. Pertinent negatives include no blurred vision, chest pain, palpitations or shortness of breath.     Past Medical History:  Diagnosis Date   Diabetes mellitus    High cholesterol    Hypertension      Family History  Problem Relation Age of Onset   Hypertension Mother    Multiple myeloma Mother    Hypertension Father    Diabetes Father     No current facility-administered medications for this visit.  Current Outpatient Medications:    amLODipine  (NORVASC) 10 MG tablet, TAKE 1 TABLET BY MOUTH EVERY DAY, Disp: 90 tablet, Rfl: 1  Facility-Administered Medications Ordered in Other Visits:    acetaminophen (TYLENOL) tablet 1,000 mg, 1,000 mg, Oral, Q6H, 1,000 mg at 03/22/23 1205 **OR** acetaminophen (TYLENOL) 160 MG/5ML solution 1,000 mg, 1,000 mg, Oral, Q6H, Stehler, Bailey C, PA-C   amLODipine (NORVASC) tablet 10 mg, 10 mg, Oral, Daily, Stehler, Bailey C, PA-C, 10 mg at 03/22/23 0933   atorvastatin (LIPITOR) tablet 40 mg, 40 mg, Oral, Daily, Stehler, Bailey C, PA-C, 40 mg at 03/22/23 0933   bisacodyl (DULCOLAX) EC tablet 10 mg, 10 mg, Oral, Daily, Stehler, Bailey C, PA-C, 10 mg at 03/22/23 0933   enalapril (VASOTEC) tablet 10 mg, 10 mg, Oral, Daily, Stehler, Bailey C, PA-C, 10 mg at 03/22/23 0932   enoxaparin (LOVENOX) injection 40 mg, 40 mg, Subcutaneous, Daily, Stehler, Bailey C, PA-C, 40 mg at 03/22/23 0933   guaiFENesin (MUCINEX) 12 hr tablet 600 mg, 600 mg, Oral, BID, Stehler, Bailey C, PA-C, 600 mg at 03/22/23 2130   hydrALAZINE (APRESOLINE) injection 10 mg, 10 mg, Intravenous, Q6H PRN, Stehler, Bailey C, PA-C   CBG monitoring, , , Q6H **AND** insulin aspart (novoLOG) injection 0-24 Units, 0-24 Units, Subcutaneous, Q6H, Stehler, Oren Bracket, PA-C, 4 Units at 03/22/23 1759   insulin glargine-yfgn (SEMGLEE) injection 10 Units, 10 Units, Subcutaneous, QHS, Stehler, Oren Bracket, PA-C, 10 Units at 03/22/23 2130   ipratropium-albuterol (DUONEB) 0.5-2.5 (3) MG/3ML nebulizer solution, , , ,    morphine (PF) 2 MG/ML injection 2 mg, 2 mg, Intravenous,  Q2H PRN, Ronney Lion, Oren Bracket, PA-C, 2 mg at 03/22/23 1310   ondansetron (ZOFRAN) injection 4 mg, 4 mg, Intravenous, Q6H PRN, Stehler, Oren Bracket, PA-C   Oral care mouth rinse, 15 mL, Mouth Rinse, PRN, Lightfoot, Harrell O, MD   oxyCODONE (Oxy IR/ROXICODONE) immediate release tablet 5-10 mg, 5-10 mg, Oral, Q4H PRN, Stehler, Oren Bracket, PA-C, 10 mg at 03/22/23 2130   pantoprazole (PROTONIX) EC tablet 40 mg, 40  mg, Oral, Daily, Stehler, Bailey C, PA-C, 40 mg at 03/22/23 0932   polyethylene glycol (MIRALAX / GLYCOLAX) packet 17 g, 17 g, Oral, Daily, Lightfoot, Harrell O, MD, 17 g at 03/21/23 0948   senna-docusate (Senokot-S) tablet 1 tablet, 1 tablet, Oral, QHS, Stehler, Oren Bracket, PA-C, 1 tablet at 03/22/23 2130   traMADol (ULTRAM) tablet 50-100 mg, 50-100 mg, Oral, Q6H PRN, Stehler, Bailey C, PA-C, 100 mg at 03/22/23 0618   No Known Allergies   Review of Systems  Constitutional: Negative.   HENT: Negative.    Eyes:  Negative for blurred vision.  Respiratory: Negative.  Negative for shortness of breath.   Cardiovascular: Negative.  Negative for chest pain and palpitations.  Gastrointestinal: Negative.   Endocrine: Negative for polydipsia, polyphagia and polyuria.  Skin: Negative.   Allergic/Immunologic: Negative.   Neurological: Negative.   Hematological: Negative.      Today's Vitals   03/10/23 1133 03/10/23 1201  BP: (!) 140/80 138/80  Pulse: 67   Temp: 97.9 F (36.6 C)   SpO2: 99%   Weight: 245 lb 9.6 oz (111.4 kg)   Height: 6\' 3"  (1.905 m)    Body mass index is 30.7 kg/m.  Wt Readings from Last 3 Encounters:  03/18/23 247 lb 9.6 oz (112.3 kg)  03/16/23 247 lb 9.6 oz (112.3 kg)  03/10/23 245 lb 9.6 oz (111.4 kg)    BP Readings from Last 3 Encounters:  03/22/23 128/76  03/16/23 (!) 171/77  03/10/23 138/80     Objective:  Physical Exam Vitals and nursing note reviewed.  Constitutional:      Appearance: Normal appearance.  HENT:     Head: Normocephalic and atraumatic.  Eyes:     Extraocular Movements: Extraocular movements intact.  Cardiovascular:     Rate and Rhythm: Normal rate and regular rhythm.     Heart sounds: Normal heart sounds.  Pulmonary:     Effort: Pulmonary effort is normal.     Breath sounds: Normal breath sounds.  Musculoskeletal:     Cervical back: Normal range of motion.  Skin:    General: Skin is warm.  Neurological:     General: No focal  deficit present.     Mental Status: He is alert.  Psychiatric:        Mood and Affect: Mood normal.         Assessment And Plan:  Type 2 diabetes mellitus with stage 2 chronic kidney disease, with long-term current use of insulin (HCC) Assessment & Plan: Chronic, I will check labs as below.  He will continue with metformin 1000mg  twice daily, dulaglutide 1.5mg  weekly and [name] 21 units sq nightly. I will adjust meds as needed.  He will rto in four months for re-evaluation.   Orders: -     Hemoglobin A1c  Hypertensive nephropathy Assessment & Plan: Chronic, fair control. Goal BP<120/80.  He will continue with enalapril 10mg  daily for now. Encouraged to follow low sodium diet.    Other dietary vitamin B12 deficiency anemia Assessment & Plan: I will check vitamin  B12 level today.   Orders: -     Vitamin B12  Class 1 obesity due to excess calories with serious comorbidity and body mass index (BMI) of 30.0 to 30.9 in adult Assessment & Plan: He is encouraged to aim for a tleast 150 minutes of exercise per week.    Immunization due -     Flu Vaccine Trivalent High Dose (Fluad)  Lung nodule Assessment & Plan: Pathology positive for adenocarcinoma. He is scheduled for thorascopy and left upper lobectomy next week.    Other orders -     [employer]; Inject 21 Units into the skin at bedtime.  Dispense: 15 mL; Refill: 1  She is encouraged to strive for BMI less than 30 to decrease cardiac risk. Advised to aim for at least 150 minutes of exercise per week.    Return for 4 month HTN/DM f/u.  Patient was given opportunity to ask questions. Patient verbalized understanding of the plan and was able to repeat key elements of the plan. All questions were answered to their satisfaction.   I, William Aliment, MD, have reviewed all documentation for this visit. The documentation on 03/10/23 for the exam, diagnosis, procedures, and orders are all accurate and complete.   IF YOU  HAVE BEEN REFERRED TO A SPECIALIST, IT MAY TAKE 1-2 WEEKS TO SCHEDULE/PROCESS THE REFERRAL. IF YOU HAVE NOT HEARD FROM US/SPECIALIST IN TWO WEEKS, PLEASE GIVE [place] A CALL AT [phone].   THE PATIENT IS ENCOURAGED TO PRACTICE SOCIAL DISTANCING DUE TO THE COVID-19 PANDEMIC.

## 2023-03-12 ENCOUNTER — Ambulatory Visit
Admission: RE | Admit: 2023-03-12 | Discharge: 2023-03-12 | Disposition: A | Discharge status: Home / Self Care | Payer: Medicare Other | Source: Ambulatory Visit | Attending: Thoracic Surgery (Cardiothoracic Vascular Surgery) | Admitting: Thoracic Surgery (Cardiothoracic Vascular Surgery)

## 2023-03-12 DIAGNOSIS — C3492 Malignant neoplasm of unspecified part of left bronchus or lung: Secondary | ICD-10-CM

## 2023-03-12 DIAGNOSIS — R918 Other nonspecific abnormal finding of lung field: Secondary | ICD-10-CM | POA: Diagnosis not present

## 2023-03-12 DIAGNOSIS — I2510 Atherosclerotic heart disease of native coronary artery without angina pectoris: Secondary | ICD-10-CM | POA: Diagnosis not present

## 2023-03-12 DIAGNOSIS — E042 Nontoxic multinodular goiter: Secondary | ICD-10-CM | POA: Diagnosis not present

## 2023-03-16 ENCOUNTER — Encounter (HOSPITAL_COMMUNITY): Payer: Self-pay

## 2023-03-16 ENCOUNTER — Encounter (HOSPITAL_COMMUNITY)
Admission: RE | Admit: 2023-03-16 | Discharge: 2023-03-16 | Disposition: A | Discharge status: Home / Self Care | Payer: Medicare Other | Source: Ambulatory Visit | Attending: Thoracic Surgery (Cardiothoracic Vascular Surgery) | Admitting: Thoracic Surgery (Cardiothoracic Vascular Surgery)

## 2023-03-16 ENCOUNTER — Other Ambulatory Visit: Payer: Self-pay

## 2023-03-16 VITALS — BP 171/77 | HR 64 | Temp 98.0°F | Resp 19 | Ht 75.0 in | Wt 247.6 lb

## 2023-03-16 DIAGNOSIS — Z01818 Encounter for other preprocedural examination: Secondary | ICD-10-CM | POA: Insufficient documentation

## 2023-03-16 DIAGNOSIS — Z794 Long term (current) use of insulin: Secondary | ICD-10-CM | POA: Diagnosis not present

## 2023-03-16 DIAGNOSIS — C3492 Malignant neoplasm of unspecified part of left bronchus or lung: Secondary | ICD-10-CM | POA: Insufficient documentation

## 2023-03-16 DIAGNOSIS — Z833 Family history of diabetes mellitus: Secondary | ICD-10-CM | POA: Diagnosis not present

## 2023-03-16 DIAGNOSIS — J9382 Other air leak: Secondary | ICD-10-CM | POA: Diagnosis not present

## 2023-03-16 DIAGNOSIS — I129 Hypertensive chronic kidney disease with stage 1 through stage 4 chronic kidney disease, or unspecified chronic kidney disease: Secondary | ICD-10-CM | POA: Diagnosis not present

## 2023-03-16 DIAGNOSIS — C3412 Malignant neoplasm of upper lobe, left bronchus or lung: Secondary | ICD-10-CM | POA: Diagnosis not present

## 2023-03-16 DIAGNOSIS — N179 Acute kidney failure, unspecified: Secondary | ICD-10-CM | POA: Diagnosis not present

## 2023-03-16 DIAGNOSIS — E871 Hypo-osmolality and hyponatremia: Secondary | ICD-10-CM | POA: Diagnosis not present

## 2023-03-16 DIAGNOSIS — C9000 Multiple myeloma not having achieved remission: Secondary | ICD-10-CM | POA: Diagnosis not present

## 2023-03-16 DIAGNOSIS — J984 Other disorders of lung: Secondary | ICD-10-CM | POA: Diagnosis not present

## 2023-03-16 DIAGNOSIS — E1122 Type 2 diabetes mellitus with diabetic chronic kidney disease: Secondary | ICD-10-CM | POA: Diagnosis not present

## 2023-03-16 DIAGNOSIS — J9811 Atelectasis: Secondary | ICD-10-CM | POA: Diagnosis not present

## 2023-03-16 DIAGNOSIS — R001 Bradycardia, unspecified: Secondary | ICD-10-CM | POA: Insufficient documentation

## 2023-03-16 DIAGNOSIS — Z1152 Encounter for screening for COVID-19: Secondary | ICD-10-CM | POA: Insufficient documentation

## 2023-03-16 DIAGNOSIS — J939 Pneumothorax, unspecified: Secondary | ICD-10-CM | POA: Diagnosis not present

## 2023-03-16 DIAGNOSIS — S0500XA Injury of conjunctiva and corneal abrasion without foreign body, unspecified eye, initial encounter: Secondary | ICD-10-CM | POA: Diagnosis not present

## 2023-03-16 DIAGNOSIS — N182 Chronic kidney disease, stage 2 (mild): Secondary | ICD-10-CM | POA: Diagnosis not present

## 2023-03-16 DIAGNOSIS — R0989 Other specified symptoms and signs involving the circulatory and respiratory systems: Secondary | ICD-10-CM | POA: Diagnosis not present

## 2023-03-16 DIAGNOSIS — Z807 Family history of other malignant neoplasms of lymphoid, hematopoietic and related tissues: Secondary | ICD-10-CM | POA: Diagnosis not present

## 2023-03-16 DIAGNOSIS — D62 Acute posthemorrhagic anemia: Secondary | ICD-10-CM | POA: Diagnosis not present

## 2023-03-16 DIAGNOSIS — Z7984 Long term (current) use of oral hypoglycemic drugs: Secondary | ICD-10-CM | POA: Diagnosis not present

## 2023-03-16 DIAGNOSIS — X58XXXA Exposure to other specified factors, initial encounter: Secondary | ICD-10-CM | POA: Diagnosis present

## 2023-03-16 DIAGNOSIS — Z4682 Encounter for fitting and adjustment of non-vascular catheter: Secondary | ICD-10-CM | POA: Diagnosis not present

## 2023-03-16 DIAGNOSIS — R042 Hemoptysis: Secondary | ICD-10-CM | POA: Diagnosis not present

## 2023-03-16 DIAGNOSIS — E66811 Obesity, class 1: Secondary | ICD-10-CM | POA: Diagnosis not present

## 2023-03-16 DIAGNOSIS — E7800 Pure hypercholesterolemia, unspecified: Secondary | ICD-10-CM | POA: Diagnosis not present

## 2023-03-16 DIAGNOSIS — Z8249 Family history of ischemic heart disease and other diseases of the circulatory system: Secondary | ICD-10-CM | POA: Diagnosis not present

## 2023-03-16 DIAGNOSIS — Z6830 Body mass index (BMI) 30.0-30.9, adult: Secondary | ICD-10-CM | POA: Diagnosis not present

## 2023-03-16 DIAGNOSIS — Z48813 Encounter for surgical aftercare following surgery on the respiratory system: Secondary | ICD-10-CM | POA: Diagnosis not present

## 2023-03-16 DIAGNOSIS — Z7985 Long-term (current) use of injectable non-insulin antidiabetic drugs: Secondary | ICD-10-CM | POA: Diagnosis not present

## 2023-03-16 DIAGNOSIS — R918 Other nonspecific abnormal finding of lung field: Secondary | ICD-10-CM | POA: Diagnosis not present

## 2023-03-16 DIAGNOSIS — E1165 Type 2 diabetes mellitus with hyperglycemia: Secondary | ICD-10-CM | POA: Diagnosis not present

## 2023-03-16 LAB — COMPREHENSIVE METABOLIC PANEL
ALT: 20 U/L (ref 0–44)
AST: 25 U/L (ref 15–41)
Albumin: 3.6 g/dL (ref 3.5–5.0)
Alkaline Phosphatase: 86 U/L (ref 38–126)
Anion gap: 5 (ref 5–15)
BUN: 11 mg/dL (ref 8–23)
CO2: 26 mmol/L (ref 22–32)
Calcium: 9 mg/dL (ref 8.9–10.3)
Chloride: 104 mmol/L (ref 98–111)
Creatinine, Ser: 1.27 mg/dL — ABNORMAL HIGH (ref 0.61–1.24)
GFR, Estimated: >60 mL/min (ref >60)
Glucose, Bld: 124 mg/dL — ABNORMAL HIGH (ref 70–99)
Potassium: 3.6 mmol/L (ref 3.5–5.1)
Sodium: 135 mmol/L (ref 135–145)
Total Bilirubin: 0.5 mg/dL (ref 0.3–1.2)
Total Protein: 8.3 g/dL — ABNORMAL HIGH (ref 6.5–8.1)

## 2023-03-16 LAB — URINALYSIS, ROUTINE W REFLEX MICROSCOPIC
Bilirubin Urine: NEGATIVE
Glucose, UA: 50 mg/dL — AB
Hgb urine dipstick: NEGATIVE
Ketones, ur: NEGATIVE mg/dL
Leukocytes,Ua: NEGATIVE
Nitrite: NEGATIVE
Protein, ur: NEGATIVE mg/dL
Specific Gravity, Urine: 1.012 (ref 1.005–1.030)
pH: 5 (ref 5.0–8.0)

## 2023-03-16 LAB — CBC
HCT: 33.8 % — ABNORMAL LOW (ref 39.0–52.0)
Hemoglobin: 10.5 g/dL — ABNORMAL LOW (ref 13.0–17.0)
MCH: 28.2 pg (ref 26.0–34.0)
MCHC: 31.1 g/dL (ref 30.0–36.0)
MCV: 90.9 fL (ref 80.0–100.0)
Platelets: 207 10*3/uL (ref 150–400)
RBC: 3.72 MIL/uL — ABNORMAL LOW (ref 4.22–5.81)
RDW: 13.5 % (ref 11.5–15.5)
WBC: 5 10*3/uL (ref 4.0–10.5)
nRBC: 0 % (ref 0.0–0.2)

## 2023-03-16 LAB — SURGICAL PCR SCREEN
MRSA, PCR: NEGATIVE
Staphylococcus aureus: NEGATIVE

## 2023-03-16 LAB — TYPE AND SCREEN
ABO/RH(D): B POS
Antibody Screen: NEGATIVE

## 2023-03-16 LAB — PROTIME-INR
INR: 1.1 (ref 0.8–1.2)
Prothrombin Time: 14.4 seconds (ref 11.4–15.2)

## 2023-03-16 LAB — APTT: aPTT: 29 seconds (ref 24–36)

## 2023-03-16 LAB — GLUCOSE, CAPILLARY: Glucose-Capillary: 151 mg/dL — ABNORMAL HIGH (ref 70–99)

## 2023-03-16 NOTE — Progress Notes (Signed)
Surgical Instructions    Your procedure is scheduled on March 18, 2023.  Report to [facility] Main Entrance "A" at 05:30 A.M., then check in with the Admitting office.  Call this number if you have problems the morning of surgery:  [phone]   If you have any questions prior to your surgery date call [phone]: Open Monday-Friday 8am-4pm If you experience any cold or flu symptoms such as cough, fever, chills, shortness of breath, etc. between now and your scheduled surgery, please notify us at the above number     Remember:  Do not eat or drink after midnight the night before your surgery    Take these medicines the morning of surgery with A SIP OF WATER:  Amlodipine (Norvasc) Atorvastatin (Lipitor)  As of today, STOP taking any Aspirin (unless otherwise instructed by your surgeon) Aleve, Naproxen, Ibuprofen, Motrin, Advil, Goody's, BC's, all herbal medications, fish oil, and all vitamins.  WHAT DO I DO ABOUT MY DIABETES MEDICATION?   DO NOT TAKE METFORMIN (GLUCOPHAGE) 2 DAYS PRIOR TO SURGERY.  THE NIGHT BEFORE SURGERY, take a HALF dose of your Insulin Glargine (Basalar Kwikpen) which is 10 units.   No Dulaglutide (TRULICITY) 1 WEEK PRIOR TO SURGERY.   HOW TO MANAGE YOUR DIABETES BEFORE AND AFTER SURGERY  Why is it important to control my blood sugar before and after surgery? Improving blood sugar levels before and after surgery helps healing and can limit problems. A way of improving blood sugar control is eating a healthy diet by:  Eating less sugar and carbohydrates  Increasing activity/exercise  Talking with your doctor about reaching your blood sugar goals High blood sugars (greater than 180 mg/dL) can raise your risk of infections and slow your recovery, so you will need to focus on controlling your diabetes during the weeks before surgery. Make sure that the doctor who takes care of your diabetes knows about your planned surgery including the date and  location.  How do I manage my blood sugar before surgery? Check your blood sugar at least 4 times a day, starting 2 days before surgery, to make sure that the level is not too high or low.  Check your blood sugar the morning of your surgery when you wake up and every 2 hours until you get to the Short Stay unit.  If your blood sugar is less than 70 mg/dL, you will need to treat for low blood sugar: Do not take insulin. Treat a low blood sugar (less than 70 mg/dL) with  cup of clear juice (cranberry or apple), 4 glucose tablets, OR glucose gel. Recheck blood sugar in 15 minutes after treatment (to make sure it is greater than 70 mg/dL). If your blood sugar is not greater than 70 mg/dL on recheck, call [phone] for further instructions. Report your blood sugar to the short stay nurse when you get to Short Stay.  If you are admitted to the hospital after surgery: Your blood sugar will be checked by the staff and you will probably be given insulin after surgery (instead of oral diabetes medicines) to make sure you have good blood sugar levels. The goal for blood sugar control after surgery is 80-180 mg/dL.            Do not wear jewelry. Do not wear lotions, powders, perfumes/cologne or deodorant. Do not shave 48 hours prior to surgery.  Men may shave face and neck. Do not bring valuables to the hospital.  [facility] is not responsible for any  belongings or valuables.    Do NOT Smoke (Tobacco/Vaping)  24 hours prior to your procedure  If you use a CPAP at night, you may bring your mask for your overnight stay.   Contacts, glasses, hearing aids, dentures or partials may not be worn into surgery, please bring cases for these belongings   For patients admitted to the hospital, discharge time will be determined by your treatment team.   Patients discharged the day of surgery will not be allowed to drive home, and someone needs to stay with them for 24 hours.   SURGICAL WAITING ROOM  VISITATION Patients having surgery or a procedure may have no more than 2 support people in the waiting area - these visitors may rotate.   Children under the age of 36 must have an adult with them who is not the patient. If the patient needs to stay at the hospital during part of their recovery, the visitor guidelines for inpatient rooms apply. Pre-op nurse will coordinate an appropriate time for 1 support person to accompany patient in pre-op.  This support person may not rotate.   Please refer to [email or web address] for the visitor guidelines for Inpatients (after your surgery is over and you are in a regular room).    Special instructions:    Oral Hygiene is also important to reduce your risk of infection.  Remember - BRUSH YOUR TEETH THE MORNING OF SURGERY WITH YOUR REGULAR TOOTHPASTE   [facility]- Preparing For Surgery  Before surgery, you can play an important role. Because skin is not sterile, your skin needs to be as free of germs as possible. You can reduce the number of germs on your skin by washing with CHG (chlorahexidine gluconate) Soap before surgery.  CHG is an antiseptic cleaner which kills germs and bonds with the skin to continue killing germs even after washing.     Please do not use if you have an allergy to CHG or antibacterial soaps. If your skin becomes reddened/irritated stop using the CHG.  Do not shave (including legs and underarms) for at least 48 hours prior to first CHG shower. It is OK to shave your face.  Please follow these instructions carefully.     Shower the NIGHT BEFORE SURGERY and the MORNING OF SURGERY with CHG Soap.   If you chose to wash your hair, wash your hair first as usual with your normal shampoo. After you shampoo, rinse your hair and body thoroughly to remove the shampoo.  Then [employer] and genitals (private parts) with your normal soap and rinse thoroughly to remove soap.  After that Use  CHG Soap as you would any other liquid soap. You can apply CHG directly to the skin and wash gently with a scrungie or a clean washcloth.   Apply the CHG Soap to your body ONLY FROM THE NECK DOWN.  Do not use on open wounds or open sores. Avoid contact with your eyes, ears, mouth and genitals (private parts). Wash Face and genitals (private parts)  with your normal soap.   Wash thoroughly, paying special attention to the area where your surgery will be performed.  Thoroughly rinse your body with warm water from the neck down.  DO NOT shower/wash with your normal soap after using and rinsing off the CHG Soap.  Pat yourself dry with a CLEAN TOWEL.  Wear CLEAN PAJAMAS to bed the night before surgery  Place CLEAN SHEETS on your bed the night before your surgery  DO NOT SLEEP  WITH PETS.   Day of Surgery:  Take a shower with CHG soap. Wear Clean/Comfortable clothing the morning of surgery Do not apply any deodorants/lotions.   Remember to brush your teeth WITH YOUR REGULAR TOOTHPASTE.    If you received a COVID test during your pre-op visit, it is requested that you wear a mask when out in public, stay away from anyone that may not be feeling well, and notify your surgeon if you develop symptoms. If you have been in contact with anyone that has tested positive in the last 10 days, please notify your surgeon.    Please read over the following fact sheets that you were given.

## 2023-03-16 NOTE — Progress Notes (Addendum)
PCP: Dr. [name] Cardiologist: denies  EKG: Today CT Chest: 03/12/23  (Spoke with Darius Bump RN, CXR not needed, order d/c'd) ECHO: denies Stress Test: denies Cardiac Cath: denies PFT: 02/24/23  Fasting Blood Sugar- 120's Checks Blood Sugar daily  GLP-1: Last Dose Trulicity: 03/08/23  ASA: Per pt/wife, stopped ASA >1week ago  Covid test: 9/23 @ appt  Patient denies shortness of breath, fever, cough, and chest pain at PAT appointment.  Patient verbalized understanding of instructions provided today at the PAT appointment.  Patient asked to review instructions at home and day of surgery.    Addendum: BP rechecked at end of PAT appt, no change. Last BP at PCP office 140/80. Pt to monitor at home with home BP cuff.

## 2023-03-17 ENCOUNTER — Other Ambulatory Visit: Payer: Self-pay | Admitting: Internal Medicine

## 2023-03-17 LAB — SARS CORONAVIRUS 2 (TAT 6-24 HRS): SARS Coronavirus 2: NEGATIVE

## 2023-03-18 ENCOUNTER — Inpatient Hospital Stay (HOSPITAL_COMMUNITY): Payer: Self-pay | Admitting: Anesthesiology

## 2023-03-18 ENCOUNTER — Inpatient Hospital Stay (HOSPITAL_COMMUNITY)
Admission: RE | Admit: 2023-03-18 | Discharge: 2023-03-24 | DRG: 164 | Disposition: A | Discharge status: Home / Self Care | Payer: Medicare Other | Attending: Thoracic Surgery (Cardiothoracic Vascular Surgery) | Admitting: Thoracic Surgery (Cardiothoracic Vascular Surgery)

## 2023-03-18 ENCOUNTER — Inpatient Hospital Stay (HOSPITAL_COMMUNITY): Payer: Medicare Other

## 2023-03-18 ENCOUNTER — Encounter (HOSPITAL_COMMUNITY): Payer: Self-pay | Admitting: Thoracic Surgery (Cardiothoracic Vascular Surgery)

## 2023-03-18 ENCOUNTER — Encounter (HOSPITAL_COMMUNITY)
Admission: RE | Disposition: A | Discharge status: Home / Self Care | Payer: Self-pay | Source: Home / Self Care | Attending: Thoracic Surgery (Cardiothoracic Vascular Surgery)

## 2023-03-18 ENCOUNTER — Other Ambulatory Visit: Payer: Self-pay

## 2023-03-18 ENCOUNTER — Inpatient Hospital Stay (HOSPITAL_COMMUNITY): Payer: Medicare Other | Admitting: Anesthesiology

## 2023-03-18 DIAGNOSIS — Z7984 Long term (current) use of oral hypoglycemic drugs: Secondary | ICD-10-CM | POA: Diagnosis not present

## 2023-03-18 DIAGNOSIS — I129 Hypertensive chronic kidney disease with stage 1 through stage 4 chronic kidney disease, or unspecified chronic kidney disease: Secondary | ICD-10-CM | POA: Diagnosis present

## 2023-03-18 DIAGNOSIS — D62 Acute posthemorrhagic anemia: Secondary | ICD-10-CM | POA: Diagnosis not present

## 2023-03-18 DIAGNOSIS — X58XXXA Exposure to other specified factors, initial encounter: Secondary | ICD-10-CM | POA: Diagnosis present

## 2023-03-18 DIAGNOSIS — E1122 Type 2 diabetes mellitus with diabetic chronic kidney disease: Secondary | ICD-10-CM | POA: Diagnosis present

## 2023-03-18 DIAGNOSIS — E66811 Obesity, class 1: Secondary | ICD-10-CM | POA: Diagnosis present

## 2023-03-18 DIAGNOSIS — Z1152 Encounter for screening for COVID-19: Secondary | ICD-10-CM

## 2023-03-18 DIAGNOSIS — J9382 Other air leak: Secondary | ICD-10-CM | POA: Diagnosis not present

## 2023-03-18 DIAGNOSIS — J9811 Atelectasis: Secondary | ICD-10-CM | POA: Diagnosis present

## 2023-03-18 DIAGNOSIS — E1165 Type 2 diabetes mellitus with hyperglycemia: Secondary | ICD-10-CM | POA: Diagnosis present

## 2023-03-18 DIAGNOSIS — J939 Pneumothorax, unspecified: Secondary | ICD-10-CM | POA: Diagnosis not present

## 2023-03-18 DIAGNOSIS — S0500XA Injury of conjunctiva and corneal abrasion without foreign body, unspecified eye, initial encounter: Secondary | ICD-10-CM | POA: Diagnosis present

## 2023-03-18 DIAGNOSIS — Z7985 Long-term (current) use of injectable non-insulin antidiabetic drugs: Secondary | ICD-10-CM

## 2023-03-18 DIAGNOSIS — E7800 Pure hypercholesterolemia, unspecified: Secondary | ICD-10-CM | POA: Diagnosis not present

## 2023-03-18 DIAGNOSIS — N182 Chronic kidney disease, stage 2 (mild): Secondary | ICD-10-CM | POA: Diagnosis present

## 2023-03-18 DIAGNOSIS — C3412 Malignant neoplasm of upper lobe, left bronchus or lung: Principal | ICD-10-CM | POA: Diagnosis present

## 2023-03-18 DIAGNOSIS — R0989 Other specified symptoms and signs involving the circulatory and respiratory systems: Secondary | ICD-10-CM | POA: Diagnosis not present

## 2023-03-18 DIAGNOSIS — Z8249 Family history of ischemic heart disease and other diseases of the circulatory system: Secondary | ICD-10-CM

## 2023-03-18 DIAGNOSIS — Z6830 Body mass index (BMI) 30.0-30.9, adult: Secondary | ICD-10-CM

## 2023-03-18 DIAGNOSIS — R918 Other nonspecific abnormal finding of lung field: Secondary | ICD-10-CM | POA: Diagnosis not present

## 2023-03-18 DIAGNOSIS — Z902 Acquired absence of lung [part of]: Principal | ICD-10-CM

## 2023-03-18 DIAGNOSIS — R042 Hemoptysis: Secondary | ICD-10-CM | POA: Diagnosis not present

## 2023-03-18 DIAGNOSIS — Z79899 Other long term (current) drug therapy: Secondary | ICD-10-CM

## 2023-03-18 DIAGNOSIS — C9000 Multiple myeloma not having achieved remission: Secondary | ICD-10-CM | POA: Diagnosis present

## 2023-03-18 DIAGNOSIS — Z4682 Encounter for fitting and adjustment of non-vascular catheter: Secondary | ICD-10-CM | POA: Diagnosis not present

## 2023-03-18 DIAGNOSIS — J984 Other disorders of lung: Secondary | ICD-10-CM | POA: Diagnosis not present

## 2023-03-18 DIAGNOSIS — Z807 Family history of other malignant neoplasms of lymphoid, hematopoietic and related tissues: Secondary | ICD-10-CM

## 2023-03-18 DIAGNOSIS — Z833 Family history of diabetes mellitus: Secondary | ICD-10-CM | POA: Diagnosis not present

## 2023-03-18 DIAGNOSIS — C3492 Malignant neoplasm of unspecified part of left bronchus or lung: Secondary | ICD-10-CM

## 2023-03-18 DIAGNOSIS — N179 Acute kidney failure, unspecified: Secondary | ICD-10-CM | POA: Diagnosis present

## 2023-03-18 DIAGNOSIS — Z794 Long term (current) use of insulin: Secondary | ICD-10-CM

## 2023-03-18 DIAGNOSIS — Z48813 Encounter for surgical aftercare following surgery on the respiratory system: Secondary | ICD-10-CM | POA: Diagnosis not present

## 2023-03-18 DIAGNOSIS — E871 Hypo-osmolality and hyponatremia: Secondary | ICD-10-CM | POA: Diagnosis present

## 2023-03-18 HISTORY — PX: INTERCOSTAL NERVE BLOCK: SHX5021

## 2023-03-18 HISTORY — PX: LYMPH NODE BIOPSY: SHX201

## 2023-03-18 LAB — GLUCOSE, CAPILLARY
Glucose-Capillary: 111 mg/dL — ABNORMAL HIGH (ref 70–99)
Glucose-Capillary: 125 mg/dL — ABNORMAL HIGH (ref 70–99)
Glucose-Capillary: 172 mg/dL — ABNORMAL HIGH (ref 70–99)
Glucose-Capillary: 169 mg/dL — ABNORMAL HIGH (ref 70–99)
Glucose-Capillary: 151 mg/dL — ABNORMAL HIGH (ref 70–99)
Glucose-Capillary: 209 mg/dL — ABNORMAL HIGH (ref 70–99)

## 2023-03-18 LAB — ABO/RH: ABO/RH(D): B POS

## 2023-03-18 SURGERY — LOBECTOMY, LUNG, ROBOT-ASSISTED, USING VATS
Anesthesia: General | Site: Chest | Laterality: Left

## 2023-03-18 MED ORDER — BISACODYL 5 MG PO TBEC
10.0000 mg | DELAYED_RELEASE_TABLET | Freq: Every day | ORAL | Status: DC
  Administered 2023-03-18 – 2023-03-22 (×5): 10 mg via ORAL
  Filled 2023-03-18 (×7): qty 2

## 2023-03-18 MED ORDER — BUPIVACAINE LIPOSOME 1.3 % IJ SUSP
INTRAMUSCULAR | Status: AC
  Filled 2023-03-18: qty 20

## 2023-03-18 MED ORDER — 0.9 % SODIUM CHLORIDE (POUR BTL) OPTIME
TOPICAL | Status: DC | PRN
  Administered 2023-03-18: 2000 mL

## 2023-03-18 MED ORDER — ACETAMINOPHEN 500 MG PO TABS
1000.0000 mg | ORAL_TABLET | Freq: Four times a day (QID) | ORAL | Status: AC
  Administered 2023-03-18 – 2023-03-23 (×16): 1000 mg via ORAL
  Filled 2023-03-18 (×18): qty 2

## 2023-03-18 MED ORDER — LACTATED RINGERS IV SOLN
INTRAVENOUS | Status: DC | PRN
Start: 2023-03-18 — End: 2023-03-18

## 2023-03-18 MED ORDER — SODIUM CHLORIDE FLUSH 0.9 % IV SOLN
INTRAVENOUS | Status: DC | PRN
  Administered 2023-03-18: 100 mL

## 2023-03-18 MED ORDER — GLYCOPYRROLATE PF 0.2 MG/ML IJ SOSY
PREFILLED_SYRINGE | INTRAMUSCULAR | Status: DC | PRN
Start: 2023-03-18 — End: 2023-03-18
  Administered 2023-03-18: .2 mg via INTRAVENOUS

## 2023-03-18 MED ORDER — ONDANSETRON HCL 4 MG/2ML IJ SOLN: 4.0000 mg | Freq: Four times a day (QID) | INTRAMUSCULAR | Status: DC | PRN

## 2023-03-18 MED ORDER — SENNOSIDES-DOCUSATE SODIUM 8.6-50 MG PO TABS
1.0000 | ORAL_TABLET | Freq: Every day | ORAL | Status: DC
  Administered 2023-03-18 – 2023-03-23 (×6): 1 via ORAL
  Filled 2023-03-18 (×7): qty 1

## 2023-03-18 MED ORDER — ONDANSETRON HCL 4 MG/2ML IJ SOLN
INTRAMUSCULAR | Status: AC
  Filled 2023-03-18: qty 2

## 2023-03-18 MED ORDER — PROPOFOL 10 MG/ML IV BOLUS
INTRAVENOUS | Status: AC
  Filled 2023-03-18: qty 20

## 2023-03-18 MED ORDER — OXYCODONE HCL 5 MG/5ML PO SOLN: 5.0000 mg | Freq: Once | ORAL | Status: DC | PRN

## 2023-03-18 MED ORDER — DEXAMETHASONE SODIUM PHOSPHATE 10 MG/ML IJ SOLN
INTRAMUSCULAR | Status: DC | PRN
  Administered 2023-03-18: 5 mg via INTRAVENOUS

## 2023-03-18 MED ORDER — ATORVASTATIN CALCIUM 40 MG PO TABS
40.0000 mg | ORAL_TABLET | Freq: Every day | ORAL | Status: DC
  Administered 2023-03-19 – 2023-03-24 (×6): 40 mg via ORAL
  Filled 2023-03-18 (×6): qty 1

## 2023-03-18 MED ORDER — VASOPRESSIN 20 UNIT/ML IV SOLN
INTRAVENOUS | Status: AC
  Filled 2023-03-18: qty 1

## 2023-03-18 MED ORDER — FENTANYL CITRATE (PF) 250 MCG/5ML IJ SOLN
INTRAMUSCULAR | Status: AC
  Filled 2023-03-18: qty 5

## 2023-03-18 MED ORDER — ORAL CARE MOUTH RINSE: 15.0000 mL | Freq: Once | OROMUCOSAL | Status: AC

## 2023-03-18 MED ORDER — ONDANSETRON HCL 4 MG/2ML IJ SOLN
INTRAMUSCULAR | Status: DC | PRN
  Administered 2023-03-18: 4 mg via INTRAVENOUS

## 2023-03-18 MED ORDER — ROCURONIUM BROMIDE 10 MG/ML (PF) SYRINGE
PREFILLED_SYRINGE | INTRAVENOUS | Status: AC
  Filled 2023-03-18: qty 10

## 2023-03-18 MED ORDER — CEFAZOLIN SODIUM-DEXTROSE 2-4 GM/100ML-% IV SOLN
2.0000 g | INTRAVENOUS | Status: AC
  Administered 2023-03-18: 2 g via INTRAVENOUS
  Filled 2023-03-18: qty 100

## 2023-03-18 MED ORDER — HEMOSTATIC AGENTS (NO CHARGE) OPTIME
TOPICAL | Status: DC | PRN
Start: 2023-03-18 — End: 2023-03-18
  Administered 2023-03-18: 1 via TOPICAL

## 2023-03-18 MED ORDER — EPHEDRINE 5 MG/ML INJ
INTRAVENOUS | Status: AC
  Filled 2023-03-18: qty 5

## 2023-03-18 MED ORDER — CEFAZOLIN SODIUM-DEXTROSE 2-4 GM/100ML-% IV SOLN
2.0000 g | Freq: Three times a day (TID) | INTRAVENOUS | Status: AC
  Administered 2023-03-18 (×2): 2 g via INTRAVENOUS
  Filled 2023-03-18 (×2): qty 100

## 2023-03-18 MED ORDER — KETOROLAC TROMETHAMINE 0.5 % OP SOLN
1.0000 [drp] | Freq: Four times a day (QID) | OPHTHALMIC | Status: AC
  Administered 2023-03-18 – 2023-03-19 (×4): 1 [drp] via OPHTHALMIC
  Filled 2023-03-18: qty 5

## 2023-03-18 MED ORDER — POLYMYXIN B-TRIMETHOPRIM 10000-0.1 UNIT/ML-% OP SOLN
1.0000 [drp] | Freq: Four times a day (QID) | OPHTHALMIC | Status: AC
  Administered 2023-03-18 – 2023-03-19 (×4): 1 [drp] via OPHTHALMIC
  Filled 2023-03-18: qty 10

## 2023-03-18 MED ORDER — ROCURONIUM BROMIDE 10 MG/ML (PF) SYRINGE
PREFILLED_SYRINGE | INTRAVENOUS | Status: DC | PRN
  Administered 2023-03-18: 20 mg via INTRAVENOUS
  Administered 2023-03-18: 10 mg via INTRAVENOUS
  Administered 2023-03-18: 80 mg via INTRAVENOUS
  Administered 2023-03-18: 20 mg via INTRAVENOUS

## 2023-03-18 MED ORDER — OXYCODONE HCL 5 MG PO TABS: 5.0000 mg | ORAL_TABLET | Freq: Once | ORAL | Status: DC | PRN

## 2023-03-18 MED ORDER — MIDAZOLAM HCL 2 MG/2ML IJ SOLN
INTRAMUSCULAR | Status: AC
  Filled 2023-03-18: qty 2

## 2023-03-18 MED ORDER — GLYCOPYRROLATE PF 0.2 MG/ML IJ SOSY
PREFILLED_SYRINGE | INTRAMUSCULAR | Status: AC
  Filled 2023-03-18: qty 1

## 2023-03-18 MED ORDER — BSS IO SOLN
15.0000 mL | Freq: Once | INTRAOCULAR | Status: AC
  Administered 2023-03-18: 15 mL
  Filled 2023-03-18: qty 15

## 2023-03-18 MED ORDER — MORPHINE SULFATE (PF) 2 MG/ML IV SOLN
2.0000 mg | INTRAVENOUS | Status: DC | PRN
  Administered 2023-03-19 – 2023-03-22 (×7): 2 mg via INTRAVENOUS
  Filled 2023-03-18 (×7): qty 1

## 2023-03-18 MED ORDER — INSULIN ASPART 100 UNIT/ML IJ SOLN
0.0000 [IU] | Freq: Four times a day (QID) | INTRAMUSCULAR | Status: DC
  Administered 2023-03-18: 2 [IU] via SUBCUTANEOUS
  Administered 2023-03-18: 4 [IU] via SUBCUTANEOUS
  Administered 2023-03-18: 8 [IU] via SUBCUTANEOUS
  Administered 2023-03-19 (×2): 2 [IU] via SUBCUTANEOUS
  Administered 2023-03-19: 4 [IU] via SUBCUTANEOUS
  Administered 2023-03-20: 2 [IU] via SUBCUTANEOUS
  Administered 2023-03-20 (×2): 4 [IU] via SUBCUTANEOUS
  Administered 2023-03-21: 2 [IU] via SUBCUTANEOUS
  Administered 2023-03-21: 8 [IU] via SUBCUTANEOUS
  Administered 2023-03-21: 4 [IU] via SUBCUTANEOUS
  Administered 2023-03-22: 2 [IU] via SUBCUTANEOUS
  Administered 2023-03-22: 4 [IU] via SUBCUTANEOUS
  Administered 2023-03-23: 2 [IU] via SUBCUTANEOUS
  Administered 2023-03-23: 4 [IU] via SUBCUTANEOUS
  Administered 2023-03-23: 2 [IU] via SUBCUTANEOUS
  Administered 2023-03-24: 4 [IU] via SUBCUTANEOUS

## 2023-03-18 MED ORDER — INSULIN ASPART 100 UNIT/ML IJ SOLN
0.0000 [IU] | INTRAMUSCULAR | Status: DC | PRN
  Administered 2023-03-18: 2 [IU] via SUBCUTANEOUS

## 2023-03-18 MED ORDER — LACTATED RINGERS IV SOLN: INTRAVENOUS | Status: DC

## 2023-03-18 MED ORDER — PHENYLEPHRINE 80 MCG/ML (10ML) SYRINGE FOR IV PUSH (FOR BLOOD PRESSURE SUPPORT)
PREFILLED_SYRINGE | INTRAVENOUS | Status: AC
  Filled 2023-03-18: qty 10

## 2023-03-18 MED ORDER — ENOXAPARIN SODIUM 40 MG/0.4ML IJ SOSY
40.0000 mg | PREFILLED_SYRINGE | Freq: Every day | INTRAMUSCULAR | Status: DC
  Administered 2023-03-18 – 2023-03-23 (×6): 40 mg via SUBCUTANEOUS
  Filled 2023-03-18 (×7): qty 0.4

## 2023-03-18 MED ORDER — PHENYLEPHRINE 80 MCG/ML (10ML) SYRINGE FOR IV PUSH (FOR BLOOD PRESSURE SUPPORT)
PREFILLED_SYRINGE | INTRAVENOUS | Status: DC | PRN
  Administered 2023-03-18: 80 ug via INTRAVENOUS

## 2023-03-18 MED ORDER — ONDANSETRON HCL 4 MG/2ML IJ SOLN: 4.0000 mg | Freq: Once | INTRAMUSCULAR | Status: DC | PRN

## 2023-03-18 MED ORDER — PANTOPRAZOLE SODIUM 40 MG PO TBEC
40.0000 mg | DELAYED_RELEASE_TABLET | Freq: Every day | ORAL | Status: DC
  Administered 2023-03-19 – 2023-03-24 (×6): 40 mg via ORAL
  Filled 2023-03-18 (×6): qty 1

## 2023-03-18 MED ORDER — OXYCODONE HCL 5 MG PO TABS
5.0000 mg | ORAL_TABLET | ORAL | Status: DC | PRN
  Administered 2023-03-18 – 2023-03-20 (×8): 10 mg via ORAL
  Administered 2023-03-21 (×2): 5 mg via ORAL
  Administered 2023-03-21 – 2023-03-23 (×7): 10 mg via ORAL
  Filled 2023-03-18 (×5): qty 2
  Filled 2023-03-18: qty 1
  Filled 2023-03-18 (×8): qty 2
  Filled 2023-03-18: qty 1
  Filled 2023-03-18 (×2): qty 2

## 2023-03-18 MED ORDER — CHLORHEXIDINE GLUCONATE 0.12 % MT SOLN
15.0000 mL | Freq: Once | OROMUCOSAL | Status: AC
  Administered 2023-03-18: 15 mL via OROMUCOSAL
  Filled 2023-03-18: qty 15

## 2023-03-18 MED ORDER — HYDRALAZINE HCL 20 MG/ML IJ SOLN: 10.0000 mg | Freq: Four times a day (QID) | INTRAMUSCULAR | Status: DC | PRN

## 2023-03-18 MED ORDER — PHENYLEPHRINE HCL-NACL 20-0.9 MG/250ML-% IV SOLN
INTRAVENOUS | Status: DC | PRN
  Administered 2023-03-18: 40 ug/min via INTRAVENOUS

## 2023-03-18 MED ORDER — BUPIVACAINE HCL (PF) 0.5 % IJ SOLN
INTRAMUSCULAR | Status: AC
  Filled 2023-03-18: qty 30

## 2023-03-18 MED ORDER — AMLODIPINE BESYLATE 10 MG PO TABS
10.0000 mg | ORAL_TABLET | Freq: Every day | ORAL | Status: DC
  Administered 2023-03-19 – 2023-03-24 (×6): 10 mg via ORAL
  Filled 2023-03-18 (×6): qty 1

## 2023-03-18 MED ORDER — FENTANYL CITRATE (PF) 250 MCG/5ML IJ SOLN
INTRAMUSCULAR | Status: DC | PRN
  Administered 2023-03-18: 100 ug via INTRAVENOUS
  Administered 2023-03-18 (×3): 50 ug via INTRAVENOUS

## 2023-03-18 MED ORDER — EPHEDRINE SULFATE-NACL 50-0.9 MG/10ML-% IV SOSY
PREFILLED_SYRINGE | INTRAVENOUS | Status: DC | PRN
  Administered 2023-03-18 (×2): 5 mg via INTRAVENOUS

## 2023-03-18 MED ORDER — TRAMADOL HCL 50 MG PO TABS
50.0000 mg | ORAL_TABLET | Freq: Four times a day (QID) | ORAL | Status: DC | PRN
  Administered 2023-03-18 – 2023-03-23 (×8): 100 mg via ORAL
  Filled 2023-03-18 (×9): qty 2

## 2023-03-18 MED ORDER — LIDOCAINE 2% (20 MG/ML) 5 ML SYRINGE
INTRAMUSCULAR | Status: DC | PRN
  Administered 2023-03-18: 80 mg via INTRAVENOUS

## 2023-03-18 MED ORDER — PROPOFOL 10 MG/ML IV BOLUS
INTRAVENOUS | Status: DC | PRN
  Administered 2023-03-18: 150 mg via INTRAVENOUS
  Administered 2023-03-18: 50 mg via INTRAVENOUS

## 2023-03-18 MED ORDER — HYDROMORPHONE HCL 1 MG/ML IJ SOLN: 0.2500 mg | INTRAMUSCULAR | Status: DC | PRN

## 2023-03-18 MED ORDER — ACETAMINOPHEN 160 MG/5ML PO SOLN: 1000.0000 mg | Freq: Four times a day (QID) | ORAL | Status: AC

## 2023-03-18 MED ORDER — LIDOCAINE 2% (20 MG/ML) 5 ML SYRINGE
INTRAMUSCULAR | Status: AC
  Filled 2023-03-18: qty 5

## 2023-03-18 MED ORDER — DEXAMETHASONE SODIUM PHOSPHATE 10 MG/ML IJ SOLN
INTRAMUSCULAR | Status: AC
  Filled 2023-03-18: qty 1

## 2023-03-18 MED ORDER — SUGAMMADEX SODIUM 200 MG/2ML IV SOLN
INTRAVENOUS | Status: DC | PRN
  Administered 2023-03-18: 400 mg via INTRAVENOUS

## 2023-03-18 SURGICAL SUPPLY — 94 items
ADH SKN CLS APL DERMABOND .7 (GAUZE/BANDAGES/DRESSINGS) ×1
APL PRP STRL LF DISP 70% ISPRP (MISCELLANEOUS) ×1
BAG SPEC RTRVL C1550 15 (MISCELLANEOUS) ×1
BLADE CLIPPER SURG (BLADE) ×1 IMPLANT
BLADE SURG 11 STRL SS (BLADE) ×1 IMPLANT
CANISTER SUCT 3000ML PPV (MISCELLANEOUS) ×2 IMPLANT
CANNULA REDUCER 12-8 DVNC XI (CANNULA) ×2 IMPLANT
CATH THORACIC 28FR (CATHETERS) ×1 IMPLANT
CHLORAPREP W/TINT 26 (MISCELLANEOUS) ×1 IMPLANT
CLIP TI MEDIUM 6 (CLIP) IMPLANT
CNTNR URN SCR LID CUP LEK RST (MISCELLANEOUS) ×5 IMPLANT
CONN ST 1/4X3/8 BEN (MISCELLANEOUS) IMPLANT
CONT SPEC 4OZ STRL OR WHT (MISCELLANEOUS) ×15
DEFOGGER SCOPE WARMER CLEARIFY (MISCELLANEOUS) ×1 IMPLANT
DERMABOND ADVANCED .7 DNX12 (GAUZE/BANDAGES/DRESSINGS) ×1 IMPLANT
DRAIN CHANNEL 28F RND 3/8 FF (WOUND CARE) IMPLANT
DRAPE ARM DVNC X/XI (DISPOSABLE) ×4 IMPLANT
DRAPE COLUMN DVNC XI (DISPOSABLE) ×1 IMPLANT
DRAPE CV SPLIT W-CLR ANES SCRN (DRAPES) ×1 IMPLANT
DRAPE ORTHO SPLIT 77X108 STRL (DRAPES) ×1
DRAPE SURG ORHT 6 SPLT 77X108 (DRAPES) ×1 IMPLANT
ELECT BLADE 6.5 EXT (BLADE) IMPLANT
ELECT REM PT RETURN 9FT ADLT (ELECTROSURGICAL) ×1
ELECTRODE REM PT RTRN 9FT ADLT (ELECTROSURGICAL) ×1 IMPLANT
FORCEPS BPLR LNG DVNC XI (INSTRUMENTS) IMPLANT
FORCEPS CADIERE DVNC XI (FORCEP) IMPLANT
GAUZE KITTNER 4X8 (MISCELLANEOUS) ×1 IMPLANT
GAUZE SPONGE 4X4 12PLY STRL (GAUZE/BANDAGES/DRESSINGS) ×1 IMPLANT
GLOVE BIO SURGEON STRL SZ7.5 (GLOVE) ×2 IMPLANT
GLOVE SURG POLYISO LF SZ8 (GLOVE) ×1 IMPLANT
GOWN STRL REUS W/ TWL LRG LVL3 (GOWN DISPOSABLE) ×2 IMPLANT
GOWN STRL REUS W/ TWL XL LVL3 (GOWN DISPOSABLE) ×2 IMPLANT
GOWN STRL REUS W/TWL 2XL LVL3 (GOWN DISPOSABLE) ×1 IMPLANT
GOWN STRL REUS W/TWL LRG LVL3 (GOWN DISPOSABLE) ×2
GOWN STRL REUS W/TWL XL LVL3 (GOWN DISPOSABLE) ×2
GRASPER TIP-UP FEN DVNC XI (INSTRUMENTS) IMPLANT
HEMOSTAT SURGICEL 2X14 (HEMOSTASIS) ×1 IMPLANT
IRRIGATION STRYKERFLOW (MISCELLANEOUS) IMPLANT
IRRIGATOR STRYKERFLOW (MISCELLANEOUS)
KIT BASIN OR (CUSTOM PROCEDURE TRAY) ×1 IMPLANT
KIT TURNOVER KIT B (KITS) ×1 IMPLANT
NDL 22X1.5 STRL (OR ONLY) (MISCELLANEOUS) ×1 IMPLANT
NEEDLE 22X1.5 STRL (OR ONLY) (MISCELLANEOUS) ×1
NS IRRIG 1000ML POUR BTL (IV SOLUTION) ×3 IMPLANT
PACK CHEST (CUSTOM PROCEDURE TRAY) ×1 IMPLANT
PAD ARMBOARD 7.5X6 YLW CONV (MISCELLANEOUS) ×5 IMPLANT
RELOAD STAPLE 45 2.0 GRY DVNC (STAPLE) IMPLANT
RELOAD STAPLE 45 2.5 WHT DVNC (STAPLE) IMPLANT
RELOAD STAPLE 45 3.5 BLU DVNC (STAPLE) IMPLANT
RELOAD STAPLE 45 4.6 BLK DVNC (STAPLE) IMPLANT
SCISSORS LAP 5X35 DISP (ENDOMECHANICALS) IMPLANT
SEAL UNIV 5-12 XI (MISCELLANEOUS) ×4 IMPLANT
SEALANT PROGEL (MISCELLANEOUS) IMPLANT
SEALER LIGASURE MARYLAND 30 (ELECTROSURGICAL) IMPLANT
SET TRI-LUMEN FLTR TB AIRSEAL (TUBING) ×1 IMPLANT
SOL ELECTROSURG ANTI STICK (MISCELLANEOUS)
SOLUTION ELECTROSURG ANTI STCK (MISCELLANEOUS) IMPLANT
SPONGE INTESTINAL PEANUT (DISPOSABLE) IMPLANT
SPONGE TONSIL 1 RF SGL (DISPOSABLE) IMPLANT
STAPLE RELOAD 45 2.0 GRAY DVNC (STAPLE) ×2
STAPLER 45 SUREFORM CVD DVNC (STAPLE) IMPLANT
STAPLER 45 SUREFORM DVNC (STAPLE) IMPLANT
STAPLER RELOAD 2.5X45 WHT DVNC (STAPLE) ×3
STAPLER RELOAD 3.5X45 BLU DVNC (STAPLE) ×6
STAPLER RELOAD 45 4.6 BLK DVNC (STAPLE) ×1
STOPCOCK 4 WAY LG BORE MALE ST (IV SETS) ×1 IMPLANT
SUT MNCRL AB 3-0 PS2 18 (SUTURE) IMPLANT
SUT MON AB 2-0 CT1 36 (SUTURE) IMPLANT
SUT PDS AB 1 CTX 36 (SUTURE) IMPLANT
SUT PROLENE 4 0 RB 1 (SUTURE)
SUT PROLENE 4-0 RB1 .5 CRCL 36 (SUTURE) IMPLANT
SUT SILK 1 MH (SUTURE) ×1 IMPLANT
SUT SILK 1 TIES 10X30 (SUTURE) IMPLANT
SUT SILK 2 0 SH (SUTURE) IMPLANT
SUT SILK 2 0SH CR/8 30 (SUTURE) IMPLANT
SUT VIC AB 1 CTX 36 (SUTURE)
SUT VIC AB 1 CTX36XBRD ANBCTR (SUTURE) IMPLANT
SUT VIC AB 2-0 CT1 27 (SUTURE) ×1
SUT VIC AB 2-0 CT1 TAPERPNT 27 (SUTURE) ×1 IMPLANT
SUT VIC AB 3-0 SH 27 (SUTURE) ×2
SUT VIC AB 3-0 SH 27X BRD (SUTURE) ×2 IMPLANT
SUT VICRYL 0 TIES 12 18 (SUTURE) ×1 IMPLANT
SUT VICRYL 0 UR6 27IN ABS (SUTURE) ×2 IMPLANT
SUT VICRYL 2 TP 1 (SUTURE) IMPLANT
SYR 10ML LL (SYRINGE) ×1 IMPLANT
SYR 20ML LL LF (SYRINGE) ×1 IMPLANT
SYR 50ML LL SCALE MARK (SYRINGE) ×1 IMPLANT
SYSTEM RETRIEVAL ANCHOR 15 (MISCELLANEOUS) IMPLANT
SYSTEM SAHARA CHEST DRAIN ATS (WOUND CARE) ×1 IMPLANT
TAPE CLOTH 4X10 WHT NS (GAUZE/BANDAGES/DRESSINGS) ×1 IMPLANT
TIP APPLICATOR SPRAY EXTEND 16 (VASCULAR PRODUCTS) IMPLANT
TOWEL GREEN STERILE (TOWEL DISPOSABLE) ×1 IMPLANT
TRAY FOLEY MTR SLVR 16FR STAT (SET/KITS/TRAYS/PACK) ×1 IMPLANT
TUBING EXTENTION W/L.L. (IV SETS) ×1 IMPLANT

## 2023-03-18 NOTE — Anesthesia Postprocedure Evaluation (Signed)
Anesthesia Post Note  Patient: William Mcguire  Procedure(s) Performed: XI ROBOTIC ASSISTED THORACOSCOPY-LEFT UPPER LOBECTOMY (Left: Chest) LYMPH NODE BIOPSY (Left: Chest) INTERCOSTAL NERVE BLOCK (Left: Chest)     Patient location during evaluation: PACU Anesthesia Type: General Level of consciousness: awake and alert Pain management: pain level controlled Vital Signs Assessment: post-procedure vital signs reviewed and stable Respiratory status: spontaneous breathing, nonlabored ventilation, respiratory function stable and patient connected to nasal cannula oxygen Cardiovascular status: blood pressure returned to baseline and stable Postop Assessment: no apparent nausea or vomiting Anesthetic complications: no  No notable events documented.  Last Vitals:  Vitals:   03/18/23 1245 03/18/23 1300  BP: (!) 156/79 (!) 154/83  Pulse: 78 69  Resp: 19 18  Temp: 36.5 C   SpO2: 99% 100%    Last Pain:  Vitals:   03/18/23 1215  TempSrc:   PainSc: Asleep                 Sender Rueb S

## 2023-03-18 NOTE — Anesthesia Procedure Notes (Signed)
Procedure Name: Intubation Date/Time: 03/18/2023 7:59 AM  Performed by: Randon Goldsmith, CRNAPre-anesthesia Checklist: Patient identified, Emergency Drugs available, Suction available and Patient being monitored Patient Re-evaluated:Patient Re-evaluated prior to induction Oxygen Delivery Method: Circle system utilized Preoxygenation: Pre-oxygenation with 100% oxygen Induction Type: IV induction Ventilation: Mask ventilation without difficulty Laryngoscope Size: Mac and 4 Grade View: Grade II Endobronchial tube: Left, Double lumen EBT, EBT position confirmed by fiberoptic bronchoscope and EBT position confirmed by auscultation and 39 Fr Number of attempts: 1 Airway Equipment and Method: Stylet, Oral airway and Rigid stylet Placement Confirmation: ETT inserted through vocal cords under direct vision, positive ETCO2 and breath sounds checked- equal and bilateral Secured at: 31 cm Tube secured with: Tape Dental Injury: Teeth and Oropharynx as per pre-operative assessment

## 2023-03-18 NOTE — Brief Op Note (Signed)
03/18/2023  11:28 AM  PATIENT:  William Mcguire  67 y.o. male  PRE-OPERATIVE DIAGNOSIS:  LUNG CANCER  POST-OPERATIVE DIAGNOSIS:  LUNG CANCER  PROCEDURE:  Procedure(s): XI ROBOTIC ASSISTED THORACOSCOPY-LEFT UPPER LOBECTOMY (Left) LYMPH NODE BIOPSY (Left) INTERCOSTAL NERVE BLOCK (Left)  SURGEON:  Surgeons and Role:    * Lightfoot, Eliezer Lofts, MD - Primary  PHYSICIAN ASSISTANT: Aloha Gell PA-C  ASSISTANTS: none   ANESTHESIA:   local and general  EBL:  100 mL   BLOOD ADMINISTERED:none  DRAINS:  left pleural drain    LOCAL MEDICATIONS USED:  OTHER exparel  SPECIMEN:  Source of Specimen:  left upper lobe, lymph nodes  DISPOSITION OF SPECIMEN:  PATHOLOGY  COUNTS:  YES  DICTATION: .Dragon Dictation  PLAN OF CARE: Admit to inpatient   PATIENT DISPOSITION:  PACU - hemodynamically stable.   Delay start of Pharmacological VTE agent (>24hrs) due to surgical blood loss or risk of bleeding: no

## 2023-03-18 NOTE — Interval H&P Note (Signed)
History and Physical Interval Note:  03/18/2023 7:31 AM  William Mcguire  has presented today for surgery, with the diagnosis of LUNG CANCER.  The various methods of treatment have been discussed with the patient and family. After consideration of risks, benefits and other options for treatment, the patient has consented to  Procedure(s): XI ROBOTIC ASSISTED THORACOSCOPY-LEFT UPPER LOBECTOMY (Left) LYMPH NODE BIOPSY (Left) INTERCOSTAL NERVE BLOCK (Left) as a surgical intervention.  The patient's history has been reviewed, patient examined, no change in status, stable for surgery.  I have reviewed the patient's chart and labs.  Questions were answered to the patient's satisfaction.     [name]

## 2023-03-18 NOTE — Hospital Course (Addendum)
History of Present Illness:    William Mcguire 67 y.o. 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  He initially was followed for multiple myeloma and was found to have this lesion.  He has been asymptomatic.  He denies any neurologic symptoms, respiratory symptoms, or weight changes.  He is a lifelong non-smoker and denies any secondhand smoke.  Dr. Cliffton Asters reviewed the patient's diagnostic studies and determined he would benefit from surgical intervention. He reviewed treatment options as well as the risks and benefits of surgery with the patient. Mr. Shappell was agreeable to proceed with surgery.  Hospital Course: Mr. Wuebker arrived at [facility] and was brought to the operating room on 03/18/23. He underwent left upper lobectomy. He tolerated the procedure well, was extubated and transferred to the PACU in stable condition. He had no air leak with cough on POD1, chest tube was clamped and follow up CXR showed an increased small left pneumothorax. The patient remained stable. His chest tube was unclamped and left to water seal. The following morning CXR showed resolved pneumothorax but he had a small air leak with cough.

## 2023-03-18 NOTE — Plan of Care (Signed)

## 2023-03-18 NOTE — Discharge Instructions (Signed)
Robot-Assisted Thoracic Surgery, Care After The following information offers guidance on how to care for yourself after your procedure. Your health care provider may also give you more specific instructions. If you have problems or questions, contact your health care provider. What can I expect after the procedure? After the procedure, it is common to have: Some pain and aches in the area of your surgical incisions. Pain when breathing in (inhaling) and coughing. Tiredness (fatigue). Trouble sleeping. Constipation. Follow these instructions at home: Medicines Take over-the-counter and prescription medicines only as told by your health care provider. If you were prescribed an antibiotic medicine, take it as told by your health care provider. Do not stop taking the antibiotic even if you start to feel better. Talk with your health care provider about safe and effective ways to manage pain after your procedure. Pain management should fit your specific health needs. Take pain medicine before pain becomes severe. Relieving and controlling your pain will make breathing easier for you. Ask your health care provider if the medicine prescribed to you requires you to avoid driving or using machinery. Eating and drinking Follow instructions from your health care provider about eating or drinking restrictions. These will vary depending on what procedure you had. Your health care provider may recommend: A liquid diet or soft diet for the first few days. Meals that are smaller and more frequent. A diet of fruits, vegetables, whole grains, and low-fat proteins. Limiting foods that are high in fat and processed sugar, including fried or sweet foods. Incision care Follow instructions from your health care provider about how to take care of your incisions. Make sure you: Wash your hands with soap and water for at least 20 seconds before and after you change your bandage (dressing). If soap and water are not  available, use hand sanitizer. Change your dressing as told by your health care provider. Leave stitches (sutures), skin glue, or adhesive strips in place. These skin closures may need to stay in place for 2 weeks or longer. If adhesive strip edges start to loosen and curl up, you may trim the loose edges. Do not remove adhesive strips completely unless your health care provider tells you to do that. Check your incision area every day for signs of infection. Check for: Redness, swelling, or more pain. Fluid or blood. Warmth. Pus or a bad smell. Activity Return to your normal activities as told by your health care provider. Ask your health care provider what activities are safe for you. Ask your health care provider when it is safe for you to drive. Do not lift anything that is heavier than 10 lb (4.5 kg), or the limit that you are told, until your health care provider says that it is safe. Rest as told by your health care provider. Avoid sitting for a long time without moving. Get up to take short walks every 1-2 hours. This is important to improve blood flow and breathing. Ask for help if you feel weak or unsteady. Do exercises as told by your health care provider. Pneumonia prevention  Do deep breathing exercises and cough regularly as directed. This helps clear mucus and opens your lungs. Doing this helps prevent lung infection (pneumonia). If you were given an incentive spirometer, use it as told. An incentive spirometer is a tool that measures how well you are filling your lungs with each breath. Coughing may hurt less if you try to support your chest. This is called splinting. Try one of these when you  cough: Hold a pillow against your chest. Place the palms of both hands on top of your incision area. Do not use any products that contain nicotine or tobacco. These products include cigarettes, chewing tobacco, and vaping devices, such as e-cigarettes. If you need help quitting, ask your  health care provider. Avoid secondhand smoke. General instructions If you have a drainage tube: Follow instructions from your health care provider about how to take care of it. Do not travel by airplane after your tube is removed until your health care provider tells you it is safe. You may need to take these actions to prevent or treat constipation: Drink enough fluid to keep your urine pale yellow. Take over-the-counter or prescription medicines. Eat foods that are high in fiber, such as beans, whole grains, and fresh fruits and vegetables. Limit foods that are high in fat and processed sugars, such as fried or sweet foods. Keep all follow-up visits. This is important. Contact a health care provider if: You have redness, swelling, or more pain around an incision. You have fluid or blood coming from an incision. An incision feels warm to the touch. You have pus or a bad smell coming from an incision. You have a fever. You cannot eat or drink without vomiting. Your pain medicine is not controlling your pain. Get help right away if: You have chest pain. Your heart is beating quickly. You have trouble breathing. You have trouble speaking. You are confused. You feel weak or dizzy, or you faint. These symptoms may represent a serious problem that is an emergency. Do not wait to see if the symptoms will go away. Get medical help right away. Call your local emergency services (911 in the [place] Do not drive yourself to the hospital. Summary Talk with your health care provider about safe and effective ways to manage pain after your procedure. Pain management should fit your specific health needs. Return to your normal activities as told by your health care provider. Ask your health care provider what activities are safe for you. Do deep breathing exercises and cough regularly as directed. This helps to clear mucus and prevent pneumonia. If it hurts to cough, ease pain by holding a pillow  against your chest or by placing the palms of both hands over your incisions. This information is not intended to replace advice given to you by your health care provider. Make sure you discuss any questions you have with your health care provider. Document Revised: 03/01/2020 Document Reviewed: 03/02/2020 Elsevier Patient Education  2024 [employer]  Discharge Instructions:  1. You may shower, please wash incisions daily with soap and water and keep dry.  If you wish to cover wounds with dressing you may do so but please keep clean and change daily.  No tub baths or swimming until incisions have completely healed.  If your incisions become red or develop any drainage please call our office at [phone]  2. No Driving until cleared by Dr. Lucilla Lame office and you are no longer using narcotic pain medications  3. Fever of 101.5 for at least 24 hours with no source, please contact our office at [phone]  4. Activity- up as tolerated, please walk at least 3 times per day.  Avoid strenuous activity, no lifting, pushing, or pulling with your arms over 8-10 lbs until cleared by Dr. Lucilla Lame office  5. If any questions or concerns arise, please do not hesitate to contact our office at [phone]

## 2023-03-18 NOTE — Plan of Care (Signed)
  Problem: Respiratory: Goal: Respiratory status will improve Outcome: Progressing   Problem: Pain Management: Goal: Pain level will decrease Outcome: Progressing   Problem: Skin Integrity: Goal: Wound healing without signs and symptoms infection will improve Outcome: Progressing

## 2023-03-18 NOTE — Transfer of Care (Signed)
Immediate Anesthesia Transfer of Care Note  Patient: William Mcguire  Procedure(s) Performed: XI ROBOTIC ASSISTED THORACOSCOPY-LEFT UPPER LOBECTOMY (Left: Chest) LYMPH NODE BIOPSY (Left: Chest) INTERCOSTAL NERVE BLOCK (Left: Chest)  Patient Location: PACU  Anesthesia Type:General  Level of Consciousness: drowsy  Airway & Oxygen Therapy: Patient Spontanous Breathing and Patient connected to face mask oxygen  Post-op Assessment: Report given to RN and Post -op Vital signs reviewed and stable  Post vital signs: Reviewed and stable  Last Vitals:  Vitals Value Taken Time  BP 167/88 03/18/23 1145  Temp    Pulse 87 03/18/23 1147  Resp 22 03/18/23 1147  SpO2 100 % 03/18/23 1147  Vitals shown include unfiled device data.  Last Pain:  Vitals:   03/18/23 0604  TempSrc:   PainSc: 0-No pain      Patients Stated Pain Goal: 0 (03/18/23 0604)  Complications: No notable events documented.

## 2023-03-18 NOTE — Anesthesia Preprocedure Evaluation (Signed)
Anesthesia Evaluation  Patient identified by MRN, date of birth, ID band Patient awake    Reviewed: Allergy & Precautions, H&P , NPO status , Patient's Chart, lab work & pertinent test results  Airway Mallampati: II  TM Distance: >3 FB Neck ROM: Full    Dental no notable dental hx.    Pulmonary  Lung nodule   Pulmonary exam normal breath sounds clear to auscultation       Cardiovascular hypertension, Normal cardiovascular exam Rhythm:Regular Rate:Normal     Neuro/Psych negative neurological ROS  negative psych ROS   GI/Hepatic negative GI ROS, Neg liver ROS,,,  Endo/Other  diabetes    Renal/GU negative Renal ROS  negative genitourinary   Musculoskeletal negative musculoskeletal ROS (+)    Abdominal   Peds negative pediatric ROS (+)  Hematology negative hematology ROS (+)   Anesthesia Other Findings   Reproductive/Obstetrics negative OB ROS                             Anesthesia Physical Anesthesia Plan  ASA: 3  Anesthesia Plan: General   Post-op Pain Management: Regional block*   Induction: Intravenous  PONV Risk Score and Plan: 2 and Ondansetron, Dexamethasone and Treatment may vary due to age or medical condition  Airway Management Planned: Double Lumen EBT  Additional Equipment: Arterial line and ClearSight  Intra-op Plan:   Post-operative Plan: Extubation in OR  Informed Consent: I have reviewed the patients History and Physical, chart, labs and discussed the procedure including the risks, benefits and alternatives for the proposed anesthesia with the patient or authorized representative who has indicated his/her understanding and acceptance.     Dental advisory given  Plan Discussed with: CRNA and Surgeon  Anesthesia Plan Comments:        Anesthesia Quick Evaluation

## 2023-03-18 NOTE — Op Note (Signed)
[address]       [place] [id number]             [phone]        03/18/2023  Patient:  William Mcguire Pre-Op Dx: Left upper lobe adenocarcinoma   Post-op Dx:  same Procedure: - Robotic assisted left video thoracoscopy - left upper lobectomy - Mediastinal lymph node sampling - Intercostal nerve block  Surgeon and Role:      * Ahmon Tosi, Eliezer Lofts, MD - Primary  Assistant: B.Stehler, PA-C  An experienced assistant was required given the complexity of this surgery and the standard of surgical care. The assistant was needed for exposure, dissection, suctioning, retraction of delicate tissues and sutures, instrument exchange and for overall help during this procedure.    Anesthesia  general EBL:  200 ml Blood Administration: none Specimen:  left upper lobe, hilar and mediastinal nodes  Drains: 28 F argyle chest tube in left chest Counts: correct   Indications: 67yo male with left upper lobe 4cm pulmonary mass.  Previous biopsy has been negative.  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  He was originally scheduled to have surgery at [facility], but this physician is not in network.    Of note on the PET scan there is another hypermetabolic area in the left lower lobe but this does not correspond with the specific pulmonary nodule.  I would like to obtain a repeat CT chest for further evaluation of this left lower lobe lesion, as well as an MRI brain given the size of the original tumor.  If both of these are negative then he is agreeable to proceed with a left robotic assisted thoracoscopy with left upper lobectomy.  The risks and benefits have been discussed.   Findings: Enlarged lymph nodes  Operative Technique: After the risks, benefits and alternatives were thoroughly discussed, the patient was brought to the operative theatre.  Anesthesia was induced, and the patient was then placed in a lateral decubitus  position and was prepped and draped in normal sterile fashion.  An appropriate surgical pause was performed, and pre-operative antibiotics were dosed accordingly.  We began by placing our 4 robotic ports in the the 7th intercostal space targeting the hilum of the lung.  A 12mm assistant port was placed in the 9th intercostal space in the anterior axillary line.  The robot was then docked and all instruments were passed under direct visualization.    The lung was then retracted superiorly, and the inferior pulmonary ligament was divided.  The hilum was mobilized anteriorly and posteriorly.  We identified the upper lobe pulmonary vein, and after careful isolation, it was divided with a vascular stapler.  We next moved to the pulmonary artery.  The artery was then divided with a vascular load stapler.  The bronchus to the upper lobe was then isolated.  After a test clamp, with good ventilation of the remaining lung, the bronchus was then divided.  The fissure was completed, and the specimen was passed into an endocatch bag.  It was removed from the anterior access site.    Lymph nodes were then sampled at hilum and mediastinum.  The chest was irrigated, and an air leak test was performed.  An intercostal nerve block was performed under direct visualization.  A 28 F chest tube was then placed, and we watch the remaining lobes re-expand.  The skin and soft tissue were closed with absorbable suture  The patient tolerated the procedure without any immediate complications, and was transferred to the PACU in stable condition.  William Mcguire

## 2023-03-19 ENCOUNTER — Inpatient Hospital Stay (HOSPITAL_COMMUNITY): Payer: Medicare Other

## 2023-03-19 LAB — GLUCOSE, CAPILLARY
Glucose-Capillary: 132 mg/dL — ABNORMAL HIGH (ref 70–99)
Glucose-Capillary: 144 mg/dL — ABNORMAL HIGH (ref 70–99)
Glucose-Capillary: 162 mg/dL — ABNORMAL HIGH (ref 70–99)
Glucose-Capillary: 165 mg/dL — ABNORMAL HIGH (ref 70–99)
Glucose-Capillary: 168 mg/dL — ABNORMAL HIGH (ref 70–99)

## 2023-03-19 LAB — BASIC METABOLIC PANEL
Anion gap: 4 — ABNORMAL LOW (ref 5–15)
BUN: 16 mg/dL (ref 8–23)
CO2: 23 mmol/L (ref 22–32)
Calcium: 8.6 mg/dL — ABNORMAL LOW (ref 8.9–10.3)
Chloride: 106 mmol/L (ref 98–111)
Creatinine, Ser: 1.56 mg/dL — ABNORMAL HIGH (ref 0.61–1.24)
GFR, Estimated: 48 mL/min — ABNORMAL LOW (ref >60)
Glucose, Bld: 131 mg/dL — ABNORMAL HIGH (ref 70–99)
Potassium: 4.1 mmol/L (ref 3.5–5.1)
Sodium: 133 mmol/L — ABNORMAL LOW (ref 135–145)

## 2023-03-19 LAB — CBC
HCT: 28.1 % — ABNORMAL LOW (ref 39.0–52.0)
Hemoglobin: 9 g/dL — ABNORMAL LOW (ref 13.0–17.0)
MCH: 28.5 pg (ref 26.0–34.0)
MCHC: 32 g/dL (ref 30.0–36.0)
MCV: 88.9 fL (ref 80.0–100.0)
Platelets: 169 10*3/uL (ref 150–400)
RBC: 3.16 MIL/uL — ABNORMAL LOW (ref 4.22–5.81)
RDW: 13.4 % (ref 11.5–15.5)
WBC: 9.3 10*3/uL (ref 4.0–10.5)
nRBC: 0 % (ref 0.0–0.2)

## 2023-03-19 MED ORDER — INSULIN GLARGINE-YFGN 100 UNIT/ML ~~LOC~~ SOLN
10.0000 [IU] | Freq: Every day | SUBCUTANEOUS | Status: DC
  Administered 2023-03-19 – 2023-03-23 (×5): 10 [IU] via SUBCUTANEOUS
  Filled 2023-03-19 (×6): qty 0.1

## 2023-03-19 MED ORDER — ENALAPRIL MALEATE 10 MG PO TABS
10.0000 mg | ORAL_TABLET | Freq: Every day | ORAL | Status: DC
  Administered 2023-03-19 – 2023-03-24 (×6): 10 mg via ORAL
  Filled 2023-03-19 (×6): qty 1

## 2023-03-19 NOTE — Progress Notes (Addendum)
      [address]       [name] [address]             [phone]      1 Day Post-Op Procedure(s) (LRB): XI ROBOTIC ASSISTED THORACOSCOPY-LEFT UPPER LOBECTOMY (Left) LYMPH NODE BIOPSY (Left) INTERCOSTAL NERVE BLOCK (Left) Subjective: Patient states he has some pain this AM but otherwise has no complaints.  Objective: Vital signs in last 24 hours: Temp:  [97.6 F (36.4 C)-98.5 F (36.9 C)] 97.6 F (36.4 C) (09/26 0752) Pulse Rate:  [65-89] 65 (09/26 0752) Cardiac Rhythm: Normal sinus rhythm (09/26 0713) Resp:  [14-20] 17 (09/26 0752) BP: (135-167)/(79-88) 141/83 (09/26 0752) SpO2:  [95 %-100 %] 95 % (09/26 0752)  Hemodynamic parameters for last 24 hours:    Intake/Output from previous day: 09/25 0701 - 09/26 0700 In: 1900 [P.O.:300; I.V.:1400; IV Piggyback:200] Out: 2340 [Urine:1860; Blood:100; Chest Tube:380] Intake/Output this shift: Total I/O In: 120 [P.O.:120] Out: -   General appearance: alert, cooperative, and no distress Neurologic: intact Heart: regular rate and rhythm, S1, S2 normal, no murmur, click, rub or gallop Lungs: clear to auscultation bilaterally Abdomen: soft, non-tender; bowel sounds normal; no masses,  no organomegaly Extremities: extremities normal, atraumatic, no cyanosis or edema Wound: Clean and dry dressing in place  Lab Results: Recent Labs    03/16/23 1328 03/19/23 0229  WBC 5.0 9.3  HGB 10.5* 9.0*  HCT 33.8* 28.1*  PLT 207 169   BMET:  Recent Labs    03/16/23 1328 03/19/23 0229  NA 135 133*  K 3.6 4.1  CL 104 106  CO2 26 23  GLUCOSE 124* 131*  BUN 11 16  CREATININE 1.27* 1.56*  CALCIUM 9.0 8.6*    PT/INR:  Recent Labs    03/16/23 1328  LABPROT 14.4  INR 1.1   ABG    Component Value Date/Time   TCO2 23 01/05/2023 0917   CBG (last 3)  Recent Labs    03/18/23 1616 03/18/23 2353 03/19/23 0622  GLUCAP 209* 168* 132*    Assessment/Plan: S/P Procedure(s) (LRB): XI ROBOTIC ASSISTED  THORACOSCOPY-LEFT UPPER LOBECTOMY (Left) LYMPH NODE BIOPSY (Left) INTERCOSTAL NERVE BLOCK (Left)  CV: HTN, will restart home Norvasc and Enalapril with parameters. NSR, HR 60s-70s.  Pulm: Saturating well on RA. CT output 380cc/24hrs. CXR with possible tiny left apical pneumothorax. No air leak with cough. Will clamp chest tube. Encourage IS and ambulation  GI: Advance diet as tolerated  Endo: On Metformin 1000mg  BID and Insulin glargine 21U at bedtime at home. Will hold Metformin for now due to bump in creatinine. Some Hyperglycemia on SSI, will start Semglee 10U at bedtime.   Renal: Cr 1.56, looks like baseline is around 1.2-1.4. Good UO. Will monitor.   DVT Prophylaxis: Lovenox  Expected postop ABLA: H/H 9/28.1, not clinically significant   Corneal abrasion: Ketorolac and Polytrim ophthalmic solution ordered  Dispo: Clamp CT and will get follow up CXR at noon   LOS: 1 day    William Reichmann, PA-C 03/19/2023   Agree with above Clamping trial IS, ambulation  William Mcguire

## 2023-03-19 NOTE — Discharge Summary (Signed)
Physician Discharge Summary  Patient ID: William Mcguire MRN: [id number] DOB/AGE: June 21, 1956 67 y.o.  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   Deficiency anemia 05/19/2022   Chronic renal disease, stage II 03/31/2022   Class 1 obesity due to excess calories with serious comorbidity and body mass index (BMI) of 31.0 to 31.9 in adult 01/21/2021   Adenocarcinoma of left lung (HCC) 10/14/2020   Hypertensive nephropathy 08/05/2018   Class 1 obesity due to excess calories with serious comorbidity and body mass index (BMI) of 32.0 to 32.9 in adult 08/05/2018   Pyelonephritis 06/23/2011   AKI (acute kidney injury) (HCC) 06/23/2011   Diabetes (HCC) 06/23/2011   HTN (hypertension) 06/23/2011   Discharge Diagnoses:   Patient Active Problem List   Diagnosis Date Noted   Other dietary vitamin B12 deficiency anemia 03/22/2023   S/P Robotic Assisted Video Thoracoscopy with Left Upper Lobectomy 03/18/2023   Lung nodule 01/05/2023   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   Deficiency anemia 05/19/2022   Chronic renal disease, stage II 03/31/2022   Class 1 obesity due to excess calories with serious comorbidity and body mass index (BMI) of 30.0 to 30.9 in adult 01/21/2021   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  He initially was followed for multiple myeloma and was found  to have this lesion.  He has been asymptomatic.  He denies any neurologic symptoms, respiratory symptoms, or weight changes.  He is a lifelong non-smoker and denies any secondhand smoke.  Dr. Cliffton Asters reviewed the patient's diagnostic studies and determined he would benefit from surgical intervention. He reviewed treatment options as well as the risks and benefits of surgery with the patient. Mr. Urvina was agreeable to proceed with surgery.  Hospital Course: Mr. Damasco arrived at [facility] and was brought to the operating room on 03/18/23. He underwent left upper lobectomy. He tolerated the procedure well, was extubated and transferred to the PACU in stable condition. He had no air leak with cough on POD1, chest tube was clamped and follow up CXR showed an increased small left pneumothorax. The patient remained stable. His chest tube was unclamped and left to water seal. The following morning CXR showed resolved pneumothorax but he had a small air leak with cough.  He has continued to have a moderate amount of serosanguineous chest tube drainage.  He had an intermittent air leak present, trace apical pneumothorax remained stable. He did have acute mild worsening of his chronic renal insufficiency.  It was trending lower but had a slight bump to 1.62. He has a expected acute blood loss anemia which has stabilized over time and being monitored clinically.  Most recent H/H dated 03/20/2023 is 9.0 and 28.5 respectively.  He has had some hypertensive readings, home blood pressure medications have been resumed. He had some dyspnea which improved with aggressive pulmonary hygiene. Incisions are noted to be healing well without  evidence of infection. He is tolerating diet and gradually increasing activities using standard postoperative protocols. CT had tidaling and a tiny intermittent air leak, chest tube was clamped on 09/30. Follow up CXR showed a trace apical pneumothorax that remained stable. His  creatinine trended down to 1.38 on 10/01. He was ambulating well on room air and his incisions were healing well without complication or sign of infection. He was felt stable for discharge home.  Consults: None  Significant Diagnostic Studies: nuclear medicine:   FINDINGS: Mediastinal blood pool activity: SUV max 2.8   Liver activity: SUV max NA   NECK: No hypermetabolic cervical lymphadenopathy.   Incidental CT findings: None.   CHEST: 4.0 x 2.4 cm mixed density nodule in the left upper lobe/lingula (series 8/image 89). When correlating with prior studies, there is a stable band like solid component anteriorly but a new mixed density/ground-glass component posteriorly. 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   Branching/tubular opacity in the posterior left lower lobe (series 8/image 84), chronic, non FDG avid.   Focal hypermetabolism in the superior segment left lower lobe (PET image 61), max SUV 9.1, but without associated pulmonary nodule on CT.   Incidental CT findings: None.   ABDOMEN/PELVIS: 1.6 cm left adrenal nodule (series 4/image 96), previously characterized as a benign adrenal adenoma, max SUV 5.0.   No abnormal hypermetabolism in the liver, pancreas, spleen, or right adrenal gland.   No hypermetabolic abdominopelvic lymphadenopathy.   Incidental CT findings: Mild hepatic steatosis.   SKELETON: No focal hypermetabolic activity to suggest skeletal metastasis.   Incidental CT findings: Degenerative changes of the visualized thoracolumbar spine.   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    Additional focal hypermetabolism in the superior segment left lower lobe without corresponding nodule. Attention to this region on follow-up is suggested.   No findings suspicious for metastatic disease.   Benign left adrenal adenoma.     Electronically Signed   By: Charline Bills M.D.   On: 12/14/2022 01:02   Treatments: surgery:  03/18/2023   Patient:  [name] Pre-Op Dx: Left upper lobe adenocarcinoma   Post-op Dx:  same Procedure: - Robotic assisted left video thoracoscopy - left upper lobectomy - Mediastinal lymph node sampling - Intercostal nerve block   Surgeon and Role:      * Lightfoot, Eliezer Lofts, MD - Primary   Assistant: B.Chadd Tollison, PA-C  An experienced assistant was required given the complexity of this surgery and the standard of surgical care. The assistant was needed for exposure, dissection, suctioning, retraction of delicate tissues and sutures, instrument exchange and for overall help during this procedure.    PATHOLOGY:  Discharge Exam: Blood pressure 138/71, pulse 74, temperature 98.5 F (36.9 C), temperature source Oral, resp. rate (!) 21, height 6\' 3"  (1.905 m), weight 112.3 kg, SpO2 94%. General appearance: alert, cooperative, and no distress Neurologic: intact Heart: regular rate and rhythm, S1, S2 normal, no murmur, click, rub or gallop Lungs: clear to auscultation bilaterally Abdomen: soft, non-tender; bowel sounds normal; no masses,  no organomegaly Extremities: extremities normal, atraumatic, no cyanosis or edema Wound: Clean and dry incisions without sign of infection  Pathology:  FINAL MICROSCOPIC DIAGNOSIS:  A. LYMPH NODE, LEVEL 9, EXCISION:  One lymph node, negative for metastatic carcinoma (0/1).  B. LYMPH NODE, HILAR, EXCISION:      One lymph node, negative for metastatic carcinoma (0/1).  C. LYMPH NODE, LEVEL 6, EXCISION:      One lymph node, negative for metastatic carcinoma (0/1).  D. LYMPH NODE, LEVEL 6 #2,  EXCISION:      One lymph node, negative for metastatic carcinoma (0/1).  E. LYMPH NODE, LEVEL 5, EXCISION:      One lymph node, negative for metastatic carcinoma (0/1).  F. LYMPH NODE, HILAR #2, EXCISION:      One lymph node, negative for metastatic carcinoma (0/1).  G. LUNG, LEFT UPPER LOBE, LOBECTOMY: Invasive mucinous adenocarcinoma with papillary features. Tumor size: 5.0 cm. Spread through air space is identified. Visceral pleural invasion is not identified. Bronchial margin and vascular margin are negative for carcinoma. Four lymph nodes, negative for metastatic carcinoma (0/4). Focus of benign subpleural organizing fibrous nodule with reactive pneumocytes (2 mm). See oncology table.  ONCOLOGY TABLE:  LUNG: Resection  Synchronous Tumors: Not applicable Total Number of Primary Tumors: 1 Procedure: Lobectomy Specimen Laterality: Left Tumor Focality: Unifocal Tumor Site: Left upper lobe Tumor Size: 5.0 cm      Total Tumor Size: 5.0 cm      Invasive Tumor Size (applies only to invasive nonmucinous adenocarcinoma with a lepidic           component): NA Histologic Type: Invasive mucinous adenocarcinoma Visceral Pleura Invasion: Not identified Direct Invasion of Adjacent Structures: No adjacent structures present Lymphovascular Invasion: Not identified Margins: All margins negative for invasive carcinoma      Closest Margin(s) to Invasive Carcinoma: 0.8 cm from the closest bronchial margin      Margin(s) Involved by Invasive Carcinoma: Not applicable       Margin Status for Non-Invasive Tumor: Not applicable Treatment Effect: No known presurgical therapy      Percentage of Residual Viable Tumor: NA Regional Lymph Nodes:      Number of Lymph Nodes Involved: 0                           Nodal Sites with Tumor: NA      Number of Lymph Nodes Examined: 10                      Nodal Sites Examined: Lever 2, 5, 6, 9, hilar Distant Metastasis:      Distant Site(s) Involved:  Not applicable Pathologic Stage Classification (pTNM, AJCC 8th Edition): pT2b, pN0 Ancillary Studies: Can be performed upon request Representative Tumor Block: G3, G4, G5 Comment(s): None ([id number])   Disposition: Discharge disposition: 01-Home or Self Care        Allergies as of 03/24/2023   No Known Allergies      Medication List     TAKE these medications    amLODipine 10 MG tablet Commonly known as: NORVASC TAKE 1 TABLET BY MOUTH EVERY DAY   aspirin EC 81 MG tablet Take 81 mg by mouth daily. Swallow whole.   atorvastatin 40 MG tablet Commonly known as: LIPITOR TAKE 1 TABLET BY MOUTH EVERY DAY   B-D INTEGRA SYRINGE 25G X 5/8" 3 ML Misc Generic drug: SYRINGE-NEEDLE (DISP) 3 ML USE AS DIRECTED   [name] KwikPen 100 UNIT/ML Inject 21 Units into the skin at bedtime.   cyanocobalamin 1000 MCG/ML injection Commonly known as: VITAMIN B12 INJECT 1 ML (1,000 MCG TOTAL) INTO THE MUSCLE EVERY  30 DAYS.   Dulaglutide 1.5 MG/0.5ML Sopn Inject 1.5 mg into the skin once a week.   enalapril 10 MG tablet Commonly known as: VASOTEC TAKE 1 TABLET BY MOUTH EVERY DAY   glucose blood test strip Use as instructed to test blood sugar 3 times a day. Dx code: e11.65   OneTouch Verio test strip Generic drug: glucose blood Use as instructed to check blood sugars twice daily E11.69   metFORMIN 1000 MG tablet Commonly known as: GLUCOPHAGE TAKE 1 TABLET BY MOUTH TWICE A DAY WITH FOOD   oxyCODONE 5 MG immediate release tablet Commonly known as: Oxy IR/ROXICODONE Take 1 tablet (5 mg total) by mouth every 6 (six) hours as needed for moderate pain.   sildenafil 100 MG tablet Commonly known as: VIAGRA TAKE 1 TABLET BY MOUTH EVERY DAY AS NEEDED (INSURANCE COVERS 10 TAB PER 77DAYS)        Follow-up Information     Corliss Skains, MD Follow up on 04/03/2023.   Specialty: Cardiothoracic Surgery Why: Follow up apointment is at 10:40AM Contact information: [address] [place] [address] [phone]                 Signed: Jenny Reichmann, PA-C  03/24/2023, 12:23 PM

## 2023-03-19 NOTE — Plan of Care (Signed)
  Problem: Respiratory: Goal: Respiratory status will improve Outcome: Progressing   Problem: Pain Management: Goal: Pain level will decrease Outcome: Progressing   Problem: Skin Integrity: Goal: Wound healing without signs and symptoms infection will improve Outcome: Progressing   Problem: Activity: Goal: Risk for activity intolerance will decrease Outcome: Progressing   Problem: Nutrition: Goal: Adequate nutrition will be maintained Outcome: Progressing   Problem: Coping: Goal: Level of anxiety will decrease Outcome: Progressing   Problem: Elimination: Goal: Will not experience complications related to bowel motility Outcome: Progressing Goal: Will not experience complications related to urinary retention Outcome: Progressing   Problem: Pain Managment: Goal: General experience of comfort will improve Outcome: Progressing   Problem: Safety: Goal: Ability to remain free from injury will improve Outcome: Progressing

## 2023-03-19 NOTE — Progress Notes (Signed)
Mobility Specialist Progress Note:   03/19/23 1000  Mobility  Activity Ambulated with assistance in hallway  Level of Assistance Standby assist, set-up cues, supervision of patient - no hands on  Assistive Device Front wheel walker  Distance Ambulated (ft) 340 ft  Activity Response Tolerated well  Mobility Referral Yes  $Mobility charge 1 Mobility  Mobility Specialist Start Time (ACUTE ONLY) 1030  Mobility Specialist Stop Time (ACUTE ONLY) 1041  Mobility Specialist Time Calculation (min) (ACUTE ONLY) 11 min    Pre Mobility: 72 HR,  93% SpO2 During Mobility: 94 HR,  90% SpO2 Post Mobility:  93 HR,  90% SpO2  Pt received in bed, agreeable to mobility. C/o some soreness at chest tube site when breathing, otherwise asymptomatic. Pt left in bed with call bell and all needs met.  D'Vante Earlene Plater Mobility Specialist Please contact via Special educational needs teacher or Rehab office at [phone]

## 2023-03-20 ENCOUNTER — Inpatient Hospital Stay (HOSPITAL_COMMUNITY): Payer: Medicare Other

## 2023-03-20 LAB — COMPREHENSIVE METABOLIC PANEL
ALT: 16 U/L (ref 0–44)
AST: 22 U/L (ref 15–41)
Albumin: 3 g/dL — ABNORMAL LOW (ref 3.5–5.0)
Alkaline Phosphatase: 66 U/L (ref 38–126)
Anion gap: 4 — ABNORMAL LOW (ref 5–15)
BUN: 20 mg/dL (ref 8–23)
CO2: 25 mmol/L (ref 22–32)
Calcium: 8.4 mg/dL — ABNORMAL LOW (ref 8.9–10.3)
Chloride: 101 mmol/L (ref 98–111)
Creatinine, Ser: 1.61 mg/dL — ABNORMAL HIGH (ref 0.61–1.24)
GFR, Estimated: 46 mL/min — ABNORMAL LOW (ref >60)
Glucose, Bld: 163 mg/dL — ABNORMAL HIGH (ref 70–99)
Potassium: 4 mmol/L (ref 3.5–5.1)
Sodium: 130 mmol/L — ABNORMAL LOW (ref 135–145)
Total Bilirubin: 0.7 mg/dL (ref 0.3–1.2)
Total Protein: 7 g/dL (ref 6.5–8.1)

## 2023-03-20 LAB — CBC
HCT: 28.5 % — ABNORMAL LOW (ref 39.0–52.0)
Hemoglobin: 9 g/dL — ABNORMAL LOW (ref 13.0–17.0)
MCH: 28.4 pg (ref 26.0–34.0)
MCHC: 31.6 g/dL (ref 30.0–36.0)
MCV: 89.9 fL (ref 80.0–100.0)
Platelets: 173 10*3/uL (ref 150–400)
RBC: 3.17 MIL/uL — ABNORMAL LOW (ref 4.22–5.81)
RDW: 13.5 % (ref 11.5–15.5)
WBC: 9 10*3/uL (ref 4.0–10.5)
nRBC: 0 % (ref 0.0–0.2)

## 2023-03-20 LAB — GLUCOSE, CAPILLARY
Glucose-Capillary: 107 mg/dL — ABNORMAL HIGH (ref 70–99)
Glucose-Capillary: 168 mg/dL — ABNORMAL HIGH (ref 70–99)
Glucose-Capillary: 128 mg/dL — ABNORMAL HIGH (ref 70–99)

## 2023-03-20 LAB — SURGICAL PATHOLOGY

## 2023-03-20 MED ORDER — POLYETHYLENE GLYCOL 3350 17 G PO PACK
17.0000 g | PACK | Freq: Every day | ORAL | Status: DC
  Administered 2023-03-20 – 2023-03-21 (×2): 17 g via ORAL
  Filled 2023-03-20 (×5): qty 1

## 2023-03-20 MED ORDER — GUAIFENESIN ER 600 MG PO TB12
600.0000 mg | ORAL_TABLET | Freq: Two times a day (BID) | ORAL | Status: DC
  Administered 2023-03-20 – 2023-03-22 (×6): 600 mg via ORAL
  Filled 2023-03-20 (×6): qty 1

## 2023-03-20 NOTE — Plan of Care (Signed)
  Problem: Education: Goal: Knowledge of disease or condition will improve Outcome: Progressing   Problem: Activity: Goal: Risk for activity intolerance will decrease Outcome: Progressing   Problem: Education: Goal: Knowledge of General Education information will improve Description: Including pain rating scale, medication(s)/side effects and non-pharmacologic comfort measures Outcome: Progressing   Problem: Health Behavior/Discharge Planning: Goal: Ability to manage health-related needs will improve Outcome: Progressing   Problem: Clinical Measurements: Goal: Will remain free from infection Outcome: Progressing   Problem: Activity: Goal: Risk for activity intolerance will decrease Outcome: Progressing   Problem: Nutrition: Goal: Adequate nutrition will be maintained Outcome: Progressing   Problem: Coping: Goal: Level of anxiety will decrease Outcome: Progressing   Problem: Elimination: Goal: Will not experience complications related to urinary retention Outcome: Progressing

## 2023-03-20 NOTE — Care Management Important Message (Signed)
Important Message  Patient Details  Name: William Mcguire MRN: [id number] Date of Birth: 05-05-1956   Important Message Given:  Yes - Medicare IM     Dorena Bodo 03/20/2023, 3:09 PM

## 2023-03-20 NOTE — Progress Notes (Signed)
Received report from Udell, RN at this time.

## 2023-03-20 NOTE — Plan of Care (Signed)

## 2023-03-20 NOTE — Progress Notes (Addendum)
[address]       [place]gu[place] [id number]             [phone]      2 Days Post-Op Procedure(s) (LRB): XI ROBOTIC ASSISTED THORACOSCOPY-LEFT UPPER LOBECTOMY (Left) LYMPH NODE BIOPSY (Left) INTERCOSTAL NERVE BLOCK (Left) Subjective: Patient states he had some SOB last night and it is hard to cough up mucus. When he has been able to cough, he has coughed up blood tinged mucus.  Objective: Vital signs in last 24 hours: Temp:  [97.6 F (36.4 C)-98.7 F (37.1 C)] 98.4 F (36.9 C) (09/27 0500) Pulse Rate:  [61-79] 79 (09/26 2327) Cardiac Rhythm: Normal sinus rhythm (09/26 2000) Resp:  [12-20] 20 (09/26 2327) BP: (118-145)/(72-89) 118/72 (09/27 0500) SpO2:  [91 %-95 %] 91 % (09/26 2327)  Hemodynamic parameters for last 24 hours:    Intake/Output from previous day: 09/26 0701 - 09/27 0700 In: 840 [P.O.:840] Out: 2960 [Urine:2500; Chest Tube:460] Intake/Output this shift: No intake/output data recorded.  General appearance: alert, cooperative, and no distress Neurologic: intact Heart: regular rate and rhythm, S1, S2 normal, no murmur, click, rub or gallop Lungs: slightly diminished bibasilar breath sounds Abdomen: soft, non-tender; bowel sounds normal; no masses,  no organomegaly Extremities: extremities normal, atraumatic, no cyanosis or edema Wound: Clean and dry dressing in place  Lab Results: Recent Labs    03/19/23 0229 03/20/23 0319  WBC 9.3 9.0  HGB 9.0* 9.0*  HCT 28.1* 28.5*  PLT 169 173   BMET:  Recent Labs    03/19/23 0229 03/20/23 0319  NA 133* 130*  K 4.1 4.0  CL 106 101  CO2 23 25  GLUCOSE 131* 163*  BUN 16 20  CREATININE 1.56* 1.61*  CALCIUM 8.6* 8.4*    PT/INR: No results for input(s): "LABPROT", "INR" in the last 72 hours. ABG    Component Value Date/Time   TCO2 23 01/05/2023 0917   CBG (last 3)  Recent Labs    03/19/23 1719 03/19/23 2325 03/20/23 0607  GLUCAP 144* 165* 107*    Assessment/Plan: S/P  Procedure(s) (LRB): XI ROBOTIC ASSISTED THORACOSCOPY-LEFT UPPER LOBECTOMY (Left) LYMPH NODE BIOPSY (Left) INTERCOSTAL NERVE BLOCK (Left)  CV: BP overall controlled on home Norvasc and Enalapril. NSR, HR 60s-70s.   Pulm: Saturating well on 2L this AM, was on RA yesterday. Wean oxygen as tolerated. Admits to some SOB last night and hard to cough up mucus. Will start Mucinex. CT output 460cc/24hrs. Chest tube was clamped yesterday with increase in small left lateral pneumothorax, it was unclamped and left to water seal. I do not see a pneumothorax on CXR this AM but he has a small air leak with cough this AM. CXR with bibasilar atelectasis, looks worse at the left base this AM. Encourage IS and ambulation.   GI: +BM, ok appetite   Endo: On Metformin 1000mg  BID and Insulin glargine 21U at bedtime at home. Will hold Metformin for now due to bump in creatinine. CBGs controlled on SSI and Semglee 10U at bedtime.    Renal: Cr 1.61 overall stable but trending up, looks like baseline is around 1.2-1.4. Good UO. Will monitor.    DVT Prophylaxis: Lovenox   Expected postop ABLA: H/H 9/28.5, not clinically significant at this time   Corneal abrasion: Ketorolac and Polytrim ophthalmic solution ordered   Dispo: Leave CT to water seal.   LOS: 2 days    Jenny Reichmann, PA-C 03/20/2023 Patient seen and examined, agree  with above Significant tidal movement with small air leak  Viviann Spare C. Dorris Fetch, MD [facility] [phone]

## 2023-03-20 NOTE — Progress Notes (Signed)
Mobility Specialist Progress Note:   03/20/23 1000  Mobility  Activity Ambulated with assistance in hallway  Level of Assistance Standby assist, set-up cues, supervision of patient - no hands on  Assistive Device Front wheel walker  Distance Ambulated (ft) 340 ft  Activity Response Tolerated well  Mobility Referral Yes  $Mobility charge 1 Mobility  Mobility Specialist Start Time (ACUTE ONLY) [id number]  Mobility Specialist Stop Time (ACUTE ONLY) 1000  Mobility Specialist Time Calculation (min) (ACUTE ONLY) 23 min    During Mobility: 95 HR,  90% SpO2 Post Mobility:  74 HR,  94% SpO2  Pt received in BR, agreeable to mobility. C/o soreness at chest stube site, otherwise asymptomatic w/ no other complaints. Pt left in bed with call bell and family present.  D'Vante Earlene Plater Mobility Specialist Please contact via Special educational needs teacher or Rehab office at [phone]

## 2023-03-21 ENCOUNTER — Inpatient Hospital Stay (HOSPITAL_COMMUNITY): Payer: Medicare Other

## 2023-03-21 LAB — BASIC METABOLIC PANEL
Anion gap: 4 — ABNORMAL LOW (ref 5–15)
BUN: 18 mg/dL (ref 8–23)
CO2: 27 mmol/L (ref 22–32)
Calcium: 8.8 mg/dL — ABNORMAL LOW (ref 8.9–10.3)
Chloride: 104 mmol/L (ref 98–111)
Creatinine, Ser: 1.46 mg/dL — ABNORMAL HIGH (ref 0.61–1.24)
GFR, Estimated: 52 mL/min — ABNORMAL LOW (ref >60)
Glucose, Bld: 100 mg/dL — ABNORMAL HIGH (ref 70–99)
Potassium: 4.1 mmol/L (ref 3.5–5.1)
Sodium: 135 mmol/L (ref 135–145)

## 2023-03-21 LAB — GLUCOSE, CAPILLARY
Glucose-Capillary: 143 mg/dL — ABNORMAL HIGH (ref 70–99)
Glucose-Capillary: 172 mg/dL — ABNORMAL HIGH (ref 70–99)
Glucose-Capillary: 204 mg/dL — ABNORMAL HIGH (ref 70–99)
Glucose-Capillary: 82 mg/dL (ref 70–99)

## 2023-03-21 MED ORDER — ORAL CARE MOUTH RINSE: 15.0000 mL | OROMUCOSAL | Status: DC | PRN

## 2023-03-21 NOTE — Progress Notes (Signed)
Patient with temperature of 99.8. Wife is concerned that he seems to be in slightly more pain than yesterday. When questioning patient, he initially denies; however, as patient continues to talk, he admits that he may be in a bit more pain than yesterday.   Pain is located in left upper chest area immediately below breast. There is a approximately small incision site in the area, closed with glue.  Pain score 7/10

## 2023-03-21 NOTE — Plan of Care (Signed)
  Problem: Activity: Goal: Risk for activity intolerance will decrease Outcome: Progressing   Problem: Respiratory: Goal: Respiratory status will improve Outcome: Progressing   Problem: Education: Goal: Knowledge of General Education information will improve Description: Including pain rating scale, medication(s)/side effects and non-pharmacologic comfort measures Outcome: Progressing   Problem: Clinical Measurements: Goal: Will remain free from infection Outcome: Progressing

## 2023-03-21 NOTE — Progress Notes (Addendum)
3 Days Post-Op Procedure(s) (LRB): XI ROBOTIC ASSISTED THORACOSCOPY-LEFT UPPER LOBECTOMY (Left) LYMPH NODE BIOPSY (Left) INTERCOSTAL NERVE BLOCK (Left) Subjective: Feels ok   Objective: Vital signs in last 24 hours: Temp:  [98.2 F (36.8 C)-99 F (37.2 C)] 98.9 F (37.2 C) (09/28 0714) Pulse Rate:  [77-93] 93 (09/28 0538) Cardiac Rhythm: Normal sinus rhythm (09/28 0800) Resp:  [14-27] 27 (09/28 0538) BP: (117-164)/(73-81) 137/78 (09/28 0714) SpO2:  [91 %-99 %] 91 % (09/28 0538)  Hemodynamic parameters for last 24 hours:    Intake/Output from previous day: 09/27 0701 - 09/28 0700 In: 360 [P.O.:360] Out: 2150 [Urine:1750; Chest Tube:400] Intake/Output this shift: Total I/O In: -  Out: 250 [Urine:250]  General appearance: alert, cooperative, and no distress Heart: regular rate and rhythm Lungs: clear to auscultation bilaterally Abdomen: benign Extremities: no edema or calf tenderness Wound: dressings w/ some drainage   Lab Results: Recent Labs    03/19/23 0229 03/20/23 0319  WBC 9.3 9.0  HGB 9.0* 9.0*  HCT 28.1* 28.5*  PLT 169 173   BMET:  Recent Labs    03/20/23 0319 03/21/23 0223  NA 130* 135  K 4.0 4.1  CL 101 104  CO2 25 27  GLUCOSE 163* 100*  BUN 20 18  CREATININE 1.61* 1.46*  CALCIUM 8.4* 8.8*    PT/INR: No results for input(s): "LABPROT", "INR" in the last 72 hours. ABG    Component Value Date/Time   TCO2 23 01/05/2023 0917   CBG (last 3)  Recent Labs    03/20/23 1753 03/21/23 0029 03/21/23 0533  GLUCAP 128* 143* 82    Meds Scheduled Meds:  acetaminophen  1,000 mg Oral Q6H   Or   acetaminophen (TYLENOL) oral liquid 160 mg/5 mL  1,000 mg Oral Q6H   amLODipine  10 mg Oral Daily   atorvastatin  40 mg Oral Daily   bisacodyl  10 mg Oral Daily   enalapril  10 mg Oral Daily   enoxaparin (LOVENOX) injection  40 mg Subcutaneous Daily   guaiFENesin  600 mg Oral BID   insulin aspart  0-24 Units Subcutaneous Q6H   insulin glargine-yfgn   10 Units Subcutaneous QHS   pantoprazole  40 mg Oral Daily   polyethylene glycol  17 g Oral Daily   senna-docusate  1 tablet Oral QHS   Continuous Infusions: PRN Meds:.hydrALAZINE, morphine injection, ondansetron (ZOFRAN) IV, mouth rinse, oxyCODONE, traMADol  Xrays DG Chest 2 View  Result Date: 03/20/2023 CLINICAL DATA:  Pneumothorax EXAM: CHEST - 2 VIEW COMPARISON:  Yesterday FINDINGS: Left chest tube in place. No visible pneumothorax. Low lung volumes with patchy density attributed atelectasis. Stable heart size and mediastinal contours. Artifact from EKG leads. IMPRESSION: 1. Left chest tube with no visible pneumothorax. 2. Low lung volumes with atelectasis. Electronically Signed   By: Tiburcio Pea M.D.   On: 03/20/2023 08:42   DG CHEST PORT 1 VIEW  Result Date: 03/19/2023 CLINICAL DATA:  Pneumothorax. EXAM: PORTABLE CHEST 1 VIEW COMPARISON:  Earlier today FINDINGS: Left chest tube directed towards the apex. Small left pneumothorax is seen primarily laterally. The apical component is faintly visualized. Stable heart size and mediastinal contours. Minor bibasilar atelectasis. IMPRESSION: Small left pneumothorax with chest tube in place. Electronically Signed   By: Narda Rutherford M.D.   On: 03/19/2023 15:57    Assessment/Plan: S/P Procedure(s) (LRB): XI ROBOTIC ASSISTED THORACOSCOPY-LEFT UPPER LOBECTOMY (Left) LYMPH NODE BIOPSY (Left) INTERCOSTAL NERVE BLOCK (Left) POD#3  1 Tmax 99, s BP 117-164, on norvasc,  vasotec hasn't been resumed w initial bump in creat, it is trending down so may be able to resume soon 2 O2 sats good on RA 3 good UOP 4 + BM 5 CT 400 ml/24h, lot of tidaling but don't see air leak, may consider clamp trial v removal in next day or so- leave for now 6 BS adeq controlled, cont SSI/insulin- likely resume home meds at d/c 7 creat 1.46, near baseline 8 hyponatremia resolved 9 CXR- stable atx , no def pntx 10 cont pulm hygiene and rehab, overall doing well    LOS: 3 days    Noel Christmas Pager [phone]  03/21/2023 Patient seen and examined, agree with findings and plan outlined above Will leave tube today due to drainage- hopefully can dc tomorrow  Viviann Spare C. Dorris Fetch, MD [facility] [phone]

## 2023-03-21 NOTE — Progress Notes (Signed)
Mobility Specialist Progress Note:   03/21/23 1336  Mobility  Activity Ambulated with assistance in hallway  Level of Assistance Contact guard assist, steadying assist  Assistive Device None  Distance Ambulated (ft) 200 ft  Activity Response Tolerated well  Mobility Referral Yes  $Mobility charge 1 Mobility  Mobility Specialist Start Time (ACUTE ONLY) 1200  Mobility Specialist Stop Time (ACUTE ONLY) 1210  Mobility Specialist Time Calculation (min) (ACUTE ONLY) 10 min   Pre Mobility: 72 HR ,  94% SpO2 Post Mobility: 80 HR , 97% SpO2  Pt received in bed agreeable to mobility. Pt requesting to ambulate in hallway w/o RW. Displayed slow slight unsteady gait requiring CG for steadying. Otherwise asymptomatic throughout. Pt returned to bed with call bell in reach and all needs met. Family present.  Leory Plowman  Mobility Specialist Please contact via [employer] office at [phone]

## 2023-03-22 ENCOUNTER — Inpatient Hospital Stay (HOSPITAL_COMMUNITY): Payer: Medicare Other

## 2023-03-22 DIAGNOSIS — D513 Other dietary vitamin B12 deficiency anemia: Secondary | ICD-10-CM | POA: Insufficient documentation

## 2023-03-22 LAB — GLUCOSE, CAPILLARY
Glucose-Capillary: 125 mg/dL — ABNORMAL HIGH (ref 70–99)
Glucose-Capillary: 119 mg/dL — ABNORMAL HIGH (ref 70–99)
Glucose-Capillary: 139 mg/dL — ABNORMAL HIGH (ref 70–99)
Glucose-Capillary: 166 mg/dL — ABNORMAL HIGH (ref 70–99)
Glucose-Capillary: 175 mg/dL — ABNORMAL HIGH (ref 70–99)

## 2023-03-22 MED ORDER — IPRATROPIUM-ALBUTEROL 0.5-2.5 (3) MG/3ML IN SOLN
3.0000 mL | Freq: Once | RESPIRATORY_TRACT | Status: AC
  Administered 2023-03-22: 3 mL via RESPIRATORY_TRACT

## 2023-03-22 MED ORDER — IPRATROPIUM-ALBUTEROL 0.5-2.5 (3) MG/3ML IN SOLN
RESPIRATORY_TRACT | Status: AC
  Filled 2023-03-22: qty 3

## 2023-03-22 NOTE — Progress Notes (Signed)
Mobility Specialist Progress Note:   03/22/23 1523  Mobility  Activity Ambulated with assistance in hallway  Level of Assistance Standby assist, set-up cues, supervision of patient - no hands on  Assistive Device Front wheel walker  Distance Ambulated (ft) 300 ft  Activity Response Tolerated well  Mobility Referral Yes  $Mobility charge 1 Mobility  Mobility Specialist Start Time (ACUTE ONLY) 1510  Mobility Specialist Stop Time (ACUTE ONLY) 1521  Mobility Specialist Time Calculation (min) (ACUTE ONLY) 11 min   Pre Mobility: 85 HR  During Mobility: 101 HR Post Mobility: 90 HR   Pt received in bed, agreeable to mobility. Prior to ambulation c/o slight pain from chest tube, otherwise asymptomatic throughout. Pt returned to bed with call bell in reach and all needs met.   William Mcguire  Mobility Specialist Please contact via [employer] office at [phone]

## 2023-03-22 NOTE — Plan of Care (Signed)
  Problem: Cardiac: Goal: Will achieve and/or maintain hemodynamic stability Outcome: Progressing   Problem: Clinical Measurements: Goal: Postoperative complications will be avoided or minimized Outcome: Progressing   Problem: Respiratory: Goal: Respiratory status will improve Outcome: Progressing   Problem: Pain Management: Goal: Pain level will decrease Outcome: Progressing   

## 2023-03-22 NOTE — Assessment & Plan Note (Signed)
Pathology positive for adenocarcinoma. He is scheduled for thorascopy and left upper lobectomy next week.

## 2023-03-22 NOTE — Assessment & Plan Note (Signed)
Chronic, I will check labs as below.  He will continue with metformin 1000mg  twice daily, dulaglutide 1.5mg  weekly and [name] 21 units sq nightly. I will adjust meds as needed.  He will rto in four months for re-evaluation.

## 2023-03-22 NOTE — Progress Notes (Signed)
Changed patients' sahara with no complications

## 2023-03-22 NOTE — Assessment & Plan Note (Signed)
Chronic, fair control. Goal BP<120/80.  He will continue with enalapril 10mg  daily for now. Encouraged to follow low sodium diet.

## 2023-03-22 NOTE — Assessment & Plan Note (Signed)
He is encouraged to aim for at least 150 minutes of exercise per week.

## 2023-03-22 NOTE — Progress Notes (Addendum)
4 Days Post-Op Procedure(s) (LRB): XI ROBOTIC ASSISTED THORACOSCOPY-LEFT UPPER LOBECTOMY (Left) LYMPH NODE BIOPSY (Left) INTERCOSTAL NERVE BLOCK (Left) Subjective: Feels well overall  Objective: Vital signs in last 24 hours: Temp:  [98.7 F (37.1 C)-99.8 F (37.7 C)] 99.2 F (37.3 C) (09/29 0728) Pulse Rate:  [73-92] 75 (09/29 0728) Cardiac Rhythm: Normal sinus rhythm (09/29 0800) Resp:  [14-24] 14 (09/29 0728) BP: (129-144)/(71-75) 144/71 (09/29 0728) SpO2:  [93 %-95 %] 95 % (09/29 0426)  Hemodynamic parameters for last 24 hours:    Intake/Output from previous day: 09/28 0701 - 09/29 0700 In: 480 [P.O.:480] Out: 850 [Urine:550; Chest Tube:300] Intake/Output this shift: Total I/O In: 240 [P.O.:240] Out: -   General appearance: alert, cooperative, and no distress Heart: regular rate and rhythm Lungs: clear to auscultation bilaterally Abdomen: benign Extremities: no edema or calf tenderness Wound: incis healing well  Lab Results: Recent Labs    03/20/23 0319  WBC 9.0  HGB 9.0*  HCT 28.5*  PLT 173   BMET:  Recent Labs    03/20/23 0319 03/21/23 0223  NA 130* 135  K 4.0 4.1  CL 101 104  CO2 25 27  GLUCOSE 163* 100*  BUN 20 18  CREATININE 1.61* 1.46*  CALCIUM 8.4* 8.8*    PT/INR: No results for input(s): "LABPROT", "INR" in the last 72 hours. ABG    Component Value Date/Time   TCO2 23 01/05/2023 0917   CBG (last 3)  Recent Labs    03/21/23 1707 03/22/23 0106 03/22/23 0615  GLUCAP 204* 125* 119*    Meds Scheduled Meds:  acetaminophen  1,000 mg Oral Q6H   Or   acetaminophen (TYLENOL) oral liquid 160 mg/5 mL  1,000 mg Oral Q6H   amLODipine  10 mg Oral Daily   atorvastatin  40 mg Oral Daily   bisacodyl  10 mg Oral Daily   enalapril  10 mg Oral Daily   enoxaparin (LOVENOX) injection  40 mg Subcutaneous Daily   guaiFENesin  600 mg Oral BID   insulin aspart  0-24 Units Subcutaneous Q6H   insulin glargine-yfgn  10 Units Subcutaneous QHS    pantoprazole  40 mg Oral Daily   polyethylene glycol  17 g Oral Daily   senna-docusate  1 tablet Oral QHS   Continuous Infusions: PRN Meds:.hydrALAZINE, morphine injection, ondansetron (ZOFRAN) IV, mouth rinse, oxyCODONE, traMADol  Xrays DG CHEST PORT 1 VIEW  Result Date: 03/21/2023 CLINICAL DATA:  Follow-up left chest tube. EXAM: PORTABLE CHEST 1 VIEW COMPARISON:  03/20/2023 FINDINGS: Stable position of left-sided chest tube. No visible pneumothorax. Stable cardiomediastinal contours. Lung volumes are low. No interstitial edema or airspace disease. Visualized osseous structures are unremarkable. IMPRESSION: 1. Stable position of left-sided chest tube. No visible pneumothorax. 2. Low lung volumes. Electronically Signed   By: Signa Kell M.D.   On: 03/21/2023 11:03    Assessment/Plan: S/P Procedure(s) (LRB): XI ROBOTIC ASSISTED THORACOSCOPY-LEFT UPPER LOBECTOMY (Left) LYMPH NODE BIOPSY (Left) INTERCOSTAL NERVE BLOCK (Left)  POD#4  1 afeb, Tmax 99.8, VSS, s Bp 120's-140's, sinus rhythm, on norvasc and vasotec 2 O2 sats good on RA 3 CT 300 ml/24h, has air leak today- will leave in place 4 voiding, not all measured 5 CXR - trace apical lat pntx on left 6 no new labs, recheck bmet in am 7 cont pulm hygiene and rehab   LOS: 4 days    Rowe Clack PA-C Pager [phone] 03/22/2023 Patient seen and examined, and discussed with Mr. Doylene Canning Serosanguinous drainage  from tube Large tidal movement with cough- minimal air leak, apparently was larger earlier Otherwise doing well  Viviann Spare C. Dorris Fetch, MD [facility] [phone]

## 2023-03-22 NOTE — Plan of Care (Signed)
  Problem: Education: Goal: Knowledge of disease or condition will improve Outcome: Progressing   Problem: Activity: Goal: Risk for activity intolerance will decrease Outcome: Progressing   Problem: Respiratory: Goal: Respiratory status will improve Outcome: Progressing   Problem: Education: Goal: Knowledge of General Education information will improve Description: Including pain rating scale, medication(s)/side effects and non-pharmacologic comfort measures Outcome: Progressing   Problem: Clinical Measurements: Goal: Respiratory complications will improve Outcome: Progressing Goal: Cardiovascular complication will be avoided Outcome: Progressing

## 2023-03-22 NOTE — Assessment & Plan Note (Signed)
I will check vitamin B12 level today.

## 2023-03-23 ENCOUNTER — Inpatient Hospital Stay (HOSPITAL_COMMUNITY): Payer: Medicare Other

## 2023-03-23 LAB — BASIC METABOLIC PANEL
Anion gap: 7 (ref 5–15)
BUN: 14 mg/dL (ref 8–23)
CO2: 26 mmol/L (ref 22–32)
Calcium: 8.7 mg/dL — ABNORMAL LOW (ref 8.9–10.3)
Chloride: 100 mmol/L (ref 98–111)
Creatinine, Ser: 1.62 mg/dL — ABNORMAL HIGH (ref 0.61–1.24)
GFR, Estimated: 46 mL/min — ABNORMAL LOW (ref >60)
Glucose, Bld: 164 mg/dL — ABNORMAL HIGH (ref 70–99)
Potassium: 4.2 mmol/L (ref 3.5–5.1)
Sodium: 133 mmol/L — ABNORMAL LOW (ref 135–145)

## 2023-03-23 LAB — GLUCOSE, CAPILLARY
Glucose-Capillary: 123 mg/dL — ABNORMAL HIGH (ref 70–99)
Glucose-Capillary: 145 mg/dL — ABNORMAL HIGH (ref 70–99)
Glucose-Capillary: 196 mg/dL — ABNORMAL HIGH (ref 70–99)
Glucose-Capillary: 99 mg/dL (ref 70–99)

## 2023-03-23 MED ORDER — IPRATROPIUM-ALBUTEROL 0.5-2.5 (3) MG/3ML IN SOLN: 3.0000 mL | Freq: Four times a day (QID) | RESPIRATORY_TRACT | Status: DC | PRN

## 2023-03-23 MED ORDER — OXYCODONE HCL 5 MG PO TABS: 5.0000 mg | ORAL_TABLET | Freq: Four times a day (QID) | ORAL | 0 refills | Status: DC | PRN

## 2023-03-23 MED ORDER — GUAIFENESIN ER 600 MG PO TB12
600.0000 mg | ORAL_TABLET | Freq: Two times a day (BID) | ORAL | Status: DC | PRN
  Administered 2023-03-23: 600 mg via ORAL
  Filled 2023-03-23: qty 1

## 2023-03-23 NOTE — Progress Notes (Signed)
Mobility Specialist Progress Note:   03/23/23 1000  Mobility  Activity Ambulated independently in room  Level of Assistance Standby assist, set-up cues, supervision of patient - no hands on  Assistive Device None  Distance Ambulated (ft) 340 ft  Activity Response Tolerated well  Mobility Referral Yes  $Mobility charge 1 Mobility  Mobility Specialist Start Time (ACUTE ONLY) [id number]  Mobility Specialist Stop Time (ACUTE ONLY) 1002  Mobility Specialist Time Calculation (min) (ACUTE ONLY) 10 min    Pre Mobility: 81 HR During Mobility: 88 HR Post Mobility:  84 HR  Pt received in BR, eager to mobility. Asymptomatic throughout w/ no complaints. Pt left on EOB with call bell and all needs met.  William Mcguire Mobility Specialist Please contact via Special educational needs teacher or Rehab office at [phone]

## 2023-03-23 NOTE — Plan of Care (Signed)

## 2023-03-23 NOTE — Progress Notes (Addendum)
      [address]       [place] [address]             [phone]      5 Days Post-Op Procedure(s) (LRB): XI ROBOTIC ASSISTED THORACOSCOPY-LEFT UPPER LOBECTOMY (Left) LYMPH NODE BIOPSY (Left) INTERCOSTAL NERVE BLOCK (Left) Subjective: Sitting up and eating breakfast. Pt without complaints but wife states he had SOB after using the bathroom last night. Improved after Duoneb.  Objective: Vital signs in last 24 hours: Temp:  [98.7 F (37.1 C)-99.8 F (37.7 C)] 98.7 F (37.1 C) (09/30 0510) Pulse Rate:  [69-94] 69 (09/30 0510) Cardiac Rhythm: Normal sinus rhythm (09/29 1910) Resp:  [14-21] 15 (09/30 0510) BP: (117-144)/(70-79) 122/70 (09/30 0510) SpO2:  [93 %-95 %] 93 % (09/30 0510)  Hemodynamic parameters for last 24 hours:    Intake/Output from previous day: 09/29 0701 - 09/30 0700 In: 1080 [P.O.:1080] Out: 350 [Chest Tube:350] Intake/Output this shift: No intake/output data recorded.  General appearance: alert, cooperative, and no distress Neurologic: intact Heart: regular rate and rhythm, S1, S2 normal, no murmur, click, rub or gallop Lungs: slightly diminished bibasilar breath sounds Abdomen: soft, non-tender; bowel sounds normal; no masses,  no organomegaly Extremities: extremities normal, atraumatic, no cyanosis or edema Wound: Clean and dry dressing in place  Lab Results: No results for input(s): "WBC", "HGB", "HCT", "PLT" in the last 72 hours. BMET:  Recent Labs    03/21/23 0223 03/23/23 0305  NA 135 133*  K 4.1 4.2  CL 104 100  CO2 27 26  GLUCOSE 100* 164*  BUN 18 14  CREATININE 1.46* 1.62*  CALCIUM 8.8* 8.7*    PT/INR: No results for input(s): "LABPROT", "INR" in the last 72 hours. ABG    Component Value Date/Time   TCO2 23 01/05/2023 0917   CBG (last 3)  Recent Labs    03/22/23 2111 03/23/23 0046 03/23/23 0611  GLUCAP 175* 196* 123*    Assessment/Plan: S/P Procedure(s) (LRB): XI ROBOTIC ASSISTED THORACOSCOPY-LEFT  UPPER LOBECTOMY (Left) LYMPH NODE BIOPSY (Left) INTERCOSTAL NERVE BLOCK (Left)  CV: NSR, HR 60s. On home Norvasc and Vasotec. BP well controlled.   Pulm: Saturating well on RA. CT output 350cc/24hrs. CXR with trace left apical pneumothorax and improved left basilar atelectasis. CT with tidaling, tiny intermittent air leak with cough. Will discuss CT management with Dr. Cliffton Asters. Some SOB at times, improved with Duoneb. Continue Mucinex PRN, Duoneb PRN, flutter valve, IS and ambulation.   GI: +BM  Endo: T2DM, on SSI PRN and Semglee 10U daily. Some hyperglycemia but CBGs overall controlled.   Renal: AKI on likely CKD, looks like baseline is around 1.2-1.4. Cr 1.62 this AM, was 1.46 on 09/28.   ID: Tmax 99.8, no obvious sign of infection  DVT Prophylaxis: Lovenox  Dispo: CT management per Dr. Cliffton Asters   LOS: 5 days    Jenny Reichmann, PA-C 03/23/2023  Agree with above Clamping trial today  Corliss Skains

## 2023-03-24 ENCOUNTER — Inpatient Hospital Stay (HOSPITAL_COMMUNITY): Payer: Medicare Other

## 2023-03-24 LAB — BASIC METABOLIC PANEL
Anion gap: 4 — ABNORMAL LOW (ref 5–15)
BUN: 7 mg/dL — ABNORMAL LOW (ref 8–23)
CO2: 27 mmol/L (ref 22–32)
Calcium: 8.8 mg/dL — ABNORMAL LOW (ref 8.9–10.3)
Chloride: 103 mmol/L (ref 98–111)
Creatinine, Ser: 1.38 mg/dL — ABNORMAL HIGH (ref 0.61–1.24)
GFR, Estimated: 55 mL/min — ABNORMAL LOW (ref >60)
Glucose, Bld: 100 mg/dL — ABNORMAL HIGH (ref 70–99)
Potassium: 4.2 mmol/L (ref 3.5–5.1)
Sodium: 134 mmol/L — ABNORMAL LOW (ref 135–145)

## 2023-03-24 LAB — GLUCOSE, CAPILLARY
Glucose-Capillary: 168 mg/dL — ABNORMAL HIGH (ref 70–99)
Glucose-Capillary: 88 mg/dL (ref 70–99)

## 2023-03-24 NOTE — Progress Notes (Signed)
Discharge instructions given to patient and spouse. Patient with no questions at this time. Will discharge via WC.

## 2023-03-24 NOTE — Progress Notes (Signed)
      [address]       [place] [address]             [phone]      6 Days Post-Op Procedure(s) (LRB): XI ROBOTIC ASSISTED THORACOSCOPY-LEFT UPPER LOBECTOMY (Left) LYMPH NODE BIOPSY (Left) INTERCOSTAL NERVE BLOCK (Left) Subjective: Pt states he has some soreness where the chest tube was, otherwise no new complaints  Objective: Vital signs in last 24 hours: Temp:  [98.4 F (36.9 C)-98.8 F (37.1 C)] 98.5 F (36.9 C) (10/01 0600) Pulse Rate:  [65-90] 90 (10/01 0600) Cardiac Rhythm: Normal sinus rhythm (09/30 1900) Resp:  [15-22] 22 (10/01 0600) BP: (127-151)/(72-79) 130/79 (10/01 0600) SpO2:  [94 %-97 %] 95 % (10/01 0600)  Hemodynamic parameters for last 24 hours:    Intake/Output from previous day: 09/30 0701 - 10/01 0700 In: 720 [P.O.:720] Out: 220 [Urine:220] Intake/Output this shift: No intake/output data recorded.  General appearance: alert, cooperative, and no distress Neurologic: intact Heart: regular rate and rhythm, S1, S2 normal, no murmur, click, rub or gallop Lungs: clear to auscultation bilaterally Abdomen: soft, non-tender; bowel sounds normal; no masses,  no organomegaly Extremities: extremities normal, atraumatic, no cyanosis or edema Wound: Clean and dry incisions without sign of infection  Lab Results: No results for input(s): "WBC", "HGB", "HCT", "PLT" in the last 72 hours. BMET:  Recent Labs    03/23/23 0305 03/24/23 0318  NA 133* 134*  K 4.2 4.2  CL 100 103  CO2 26 27  GLUCOSE 164* 100*  BUN 14 7*  CREATININE 1.62* 1.38*  CALCIUM 8.7* 8.8*    PT/INR: No results for input(s): "LABPROT", "INR" in the last 72 hours. ABG    Component Value Date/Time   TCO2 23 01/05/2023 0917   CBG (last 3)  Recent Labs    03/23/23 1722 03/24/23 0012 03/24/23 0556  GLUCAP 99 168* 88    Assessment/Plan: S/P Procedure(s) (LRB): XI ROBOTIC ASSISTED THORACOSCOPY-LEFT UPPER LOBECTOMY (Left) LYMPH NODE BIOPSY  (Left) INTERCOSTAL NERVE BLOCK (Left)  CV: NSR, HR 60-70s. On home Norvasc and Vasotec. BP well controlled.    Pulm: Saturating well on RA. Passed clamping trial yesterday, chest tube was removed. CXR yesterday with stable trace left apical pneumothorax. Encourage IS, flutter valve and ambulation  GI: +BM   Endo: T2DM, on SSI PRN and Semglee 10U daily. CBGs overall controlled. Will restart home meds at discharge.   Renal: AKI on likely CKD, looks like baseline is around 1.2-1.4. Cr down to 1.38 this AM from 1.62 yesterday.    ID: Tmax 98.8, no obvious sign of infection   DVT Prophylaxis: Lovenox   Dispo: D/C to home today   LOS: 6 days    Jenny Reichmann, PA-C 03/24/2023

## 2023-03-24 NOTE — Plan of Care (Signed)

## 2023-03-25 ENCOUNTER — Encounter: Payer: Self-pay | Admitting: Hematology and Oncology

## 2023-03-25 ENCOUNTER — Other Ambulatory Visit: Payer: Self-pay | Admitting: Internal Medicine

## 2023-03-26 ENCOUNTER — Encounter (HOSPITAL_COMMUNITY): Payer: Self-pay

## 2023-03-26 ENCOUNTER — Telehealth: Payer: Self-pay

## 2023-03-26 ENCOUNTER — Emergency Department (HOSPITAL_COMMUNITY): Payer: Medicare Other

## 2023-03-26 ENCOUNTER — Inpatient Hospital Stay (HOSPITAL_COMMUNITY)
Admission: EM | Admit: 2023-03-26 | Discharge: 2023-03-28 | DRG: 872 | Disposition: A | Discharge status: Home / Self Care | Payer: Medicare Other | Attending: Internal Medicine | Admitting: Internal Medicine

## 2023-03-26 ENCOUNTER — Other Ambulatory Visit: Payer: Self-pay

## 2023-03-26 DIAGNOSIS — J95811 Postprocedural pneumothorax: Secondary | ICD-10-CM | POA: Diagnosis present

## 2023-03-26 DIAGNOSIS — E1122 Type 2 diabetes mellitus with diabetic chronic kidney disease: Secondary | ICD-10-CM | POA: Diagnosis present

## 2023-03-26 DIAGNOSIS — E871 Hypo-osmolality and hyponatremia: Secondary | ICD-10-CM | POA: Diagnosis not present

## 2023-03-26 DIAGNOSIS — R059 Cough, unspecified: Secondary | ICD-10-CM | POA: Diagnosis not present

## 2023-03-26 DIAGNOSIS — Z807 Family history of other malignant neoplasms of lymphoid, hematopoietic and related tissues: Secondary | ICD-10-CM

## 2023-03-26 DIAGNOSIS — Z7985 Long-term (current) use of injectable non-insulin antidiabetic drugs: Secondary | ICD-10-CM

## 2023-03-26 DIAGNOSIS — Z8249 Family history of ischemic heart disease and other diseases of the circulatory system: Secondary | ICD-10-CM

## 2023-03-26 DIAGNOSIS — E785 Hyperlipidemia, unspecified: Secondary | ICD-10-CM

## 2023-03-26 DIAGNOSIS — Z7984 Long term (current) use of oral hypoglycemic drugs: Secondary | ICD-10-CM | POA: Diagnosis not present

## 2023-03-26 DIAGNOSIS — I129 Hypertensive chronic kidney disease with stage 1 through stage 4 chronic kidney disease, or unspecified chronic kidney disease: Secondary | ICD-10-CM | POA: Diagnosis not present

## 2023-03-26 DIAGNOSIS — Z7982 Long term (current) use of aspirin: Secondary | ICD-10-CM | POA: Diagnosis not present

## 2023-03-26 DIAGNOSIS — C3412 Malignant neoplasm of upper lobe, left bronchus or lung: Secondary | ICD-10-CM | POA: Diagnosis present

## 2023-03-26 DIAGNOSIS — R918 Other nonspecific abnormal finding of lung field: Secondary | ICD-10-CM | POA: Diagnosis not present

## 2023-03-26 DIAGNOSIS — J948 Other specified pleural conditions: Secondary | ICD-10-CM | POA: Diagnosis not present

## 2023-03-26 DIAGNOSIS — D649 Anemia, unspecified: Secondary | ICD-10-CM | POA: Diagnosis present

## 2023-03-26 DIAGNOSIS — N3001 Acute cystitis with hematuria: Secondary | ICD-10-CM | POA: Diagnosis not present

## 2023-03-26 DIAGNOSIS — N3000 Acute cystitis without hematuria: Secondary | ICD-10-CM | POA: Diagnosis present

## 2023-03-26 DIAGNOSIS — N182 Chronic kidney disease, stage 2 (mild): Secondary | ICD-10-CM | POA: Diagnosis not present

## 2023-03-26 DIAGNOSIS — Z1152 Encounter for screening for COVID-19: Secondary | ICD-10-CM

## 2023-03-26 DIAGNOSIS — Z902 Acquired absence of lung [part of]: Secondary | ICD-10-CM

## 2023-03-26 DIAGNOSIS — A419 Sepsis, unspecified organism: Principal | ICD-10-CM | POA: Diagnosis present

## 2023-03-26 DIAGNOSIS — R509 Fever, unspecified: Secondary | ICD-10-CM | POA: Diagnosis not present

## 2023-03-26 DIAGNOSIS — N1831 Chronic kidney disease, stage 3a: Secondary | ICD-10-CM | POA: Diagnosis present

## 2023-03-26 DIAGNOSIS — Z794 Long term (current) use of insulin: Secondary | ICD-10-CM

## 2023-03-26 DIAGNOSIS — E7800 Pure hypercholesterolemia, unspecified: Secondary | ICD-10-CM | POA: Diagnosis not present

## 2023-03-26 DIAGNOSIS — I10 Essential (primary) hypertension: Secondary | ICD-10-CM | POA: Diagnosis present

## 2023-03-26 DIAGNOSIS — E872 Acidosis, unspecified: Secondary | ICD-10-CM | POA: Diagnosis not present

## 2023-03-26 DIAGNOSIS — Z79899 Other long term (current) drug therapy: Secondary | ICD-10-CM

## 2023-03-26 DIAGNOSIS — R7989 Other specified abnormal findings of blood chemistry: Secondary | ICD-10-CM

## 2023-03-26 DIAGNOSIS — R5381 Other malaise: Secondary | ICD-10-CM | POA: Diagnosis not present

## 2023-03-26 DIAGNOSIS — Z833 Family history of diabetes mellitus: Secondary | ICD-10-CM

## 2023-03-26 DIAGNOSIS — R0602 Shortness of breath: Secondary | ICD-10-CM | POA: Diagnosis not present

## 2023-03-26 DIAGNOSIS — J939 Pneumothorax, unspecified: Secondary | ICD-10-CM | POA: Diagnosis not present

## 2023-03-26 DIAGNOSIS — E119 Type 2 diabetes mellitus without complications: Secondary | ICD-10-CM

## 2023-03-26 DIAGNOSIS — C3492 Malignant neoplasm of unspecified part of left bronchus or lung: Secondary | ICD-10-CM

## 2023-03-26 LAB — URINALYSIS, W/ REFLEX TO CULTURE (INFECTION SUSPECTED)
Glucose, UA: NEGATIVE mg/dL
Ketones, ur: NEGATIVE mg/dL
Nitrite: NEGATIVE
Protein, ur: 30 mg/dL — AB
Specific Gravity, Urine: 1.027 (ref 1.005–1.030)
WBC, UA: >50 WBC/hpf (ref 0–5)
pH: 5 (ref 5.0–8.0)

## 2023-03-26 LAB — CBC WITH DIFFERENTIAL/PLATELET
Abs Immature Granulocytes: 0.14 10*3/uL — ABNORMAL HIGH (ref 0.00–0.07)
Basophils Absolute: 0 10*3/uL (ref 0.0–0.1)
Basophils Relative: 0 %
Eosinophils Absolute: 0.1 10*3/uL (ref 0.0–0.5)
Eosinophils Relative: 1 %
HCT: 30.5 % — ABNORMAL LOW (ref 39.0–52.0)
Hemoglobin: 9.7 g/dL — ABNORMAL LOW (ref 13.0–17.0)
Immature Granulocytes: 1 %
Lymphocytes Relative: 8 %
Lymphs Abs: 1 10*3/uL (ref 0.7–4.0)
MCH: 28.5 pg (ref 26.0–34.0)
MCHC: 31.8 g/dL (ref 30.0–36.0)
MCV: 89.7 fL (ref 80.0–100.0)
Monocytes Absolute: 1.1 10*3/uL — ABNORMAL HIGH (ref 0.1–1.0)
Monocytes Relative: 9 %
Neutro Abs: 10.2 10*3/uL — ABNORMAL HIGH (ref 1.7–7.7)
Neutrophils Relative %: 81 %
Platelets: 267 10*3/uL (ref 150–400)
RBC: 3.4 MIL/uL — ABNORMAL LOW (ref 4.22–5.81)
RDW: 12.9 % (ref 11.5–15.5)
WBC: 12.5 10*3/uL — ABNORMAL HIGH (ref 4.0–10.5)
nRBC: 0 % (ref 0.0–0.2)

## 2023-03-26 LAB — COMPREHENSIVE METABOLIC PANEL
ALT: 83 U/L — ABNORMAL HIGH (ref 0–44)
AST: 58 U/L — ABNORMAL HIGH (ref 15–41)
Albumin: 3 g/dL — ABNORMAL LOW (ref 3.5–5.0)
Alkaline Phosphatase: 84 U/L (ref 38–126)
Anion gap: 9 (ref 5–15)
BUN: 18 mg/dL (ref 8–23)
CO2: 23 mmol/L (ref 22–32)
Calcium: 9 mg/dL (ref 8.9–10.3)
Chloride: 101 mmol/L (ref 98–111)
Creatinine, Ser: 1.51 mg/dL — ABNORMAL HIGH (ref 0.61–1.24)
GFR, Estimated: 50 mL/min — ABNORMAL LOW (ref >60)
Glucose, Bld: 162 mg/dL — ABNORMAL HIGH (ref 70–99)
Potassium: 4.4 mmol/L (ref 3.5–5.1)
Sodium: 133 mmol/L — ABNORMAL LOW (ref 135–145)
Total Bilirubin: 0.5 mg/dL (ref 0.3–1.2)
Total Protein: 8.1 g/dL (ref 6.5–8.1)

## 2023-03-26 LAB — I-STAT CG4 LACTIC ACID, ED
Lactic Acid, Venous: 2 mmol/L (ref 0.5–1.9)
Lactic Acid, Venous: 1.1 mmol/L (ref 0.5–1.9)

## 2023-03-26 LAB — CBG MONITORING, ED: Glucose-Capillary: 95 mg/dL (ref 70–99)

## 2023-03-26 LAB — RESP PANEL BY RT-PCR (RSV, FLU A&B, COVID)  RVPGX2
Influenza A by PCR: NEGATIVE
Influenza B by PCR: NEGATIVE
Resp Syncytial Virus by PCR: NEGATIVE
SARS Coronavirus 2 by RT PCR: NEGATIVE

## 2023-03-26 LAB — GLUCOSE, CAPILLARY: Glucose-Capillary: 106 mg/dL — ABNORMAL HIGH (ref 70–99)

## 2023-03-26 MED ORDER — INSULIN ASPART 100 UNIT/ML IJ SOLN
0.0000 [IU] | Freq: Three times a day (TID) | INTRAMUSCULAR | Status: DC
  Administered 2023-03-27 – 2023-03-28 (×2): 3 [IU] via SUBCUTANEOUS

## 2023-03-26 MED ORDER — INSULIN ASPART 100 UNIT/ML IJ SOLN
0.0000 [IU] | Freq: Every day | INTRAMUSCULAR | Status: DC
  Administered 2023-03-27: 2 [IU] via SUBCUTANEOUS

## 2023-03-26 MED ORDER — ACETAMINOPHEN 650 MG RE SUPP: 650.0000 mg | Freq: Four times a day (QID) | RECTAL | Status: DC | PRN

## 2023-03-26 MED ORDER — SENNOSIDES-DOCUSATE SODIUM 8.6-50 MG PO TABS: 1.0000 | ORAL_TABLET | Freq: Every evening | ORAL | Status: DC | PRN

## 2023-03-26 MED ORDER — ONDANSETRON HCL 4 MG/2ML IJ SOLN: 4.0000 mg | Freq: Four times a day (QID) | INTRAMUSCULAR | Status: DC | PRN

## 2023-03-26 MED ORDER — INSULIN GLARGINE-YFGN 100 UNIT/ML ~~LOC~~ SOLN
21.0000 [IU] | Freq: Every day | SUBCUTANEOUS | Status: DC
  Administered 2023-03-27 (×2): 21 [IU] via SUBCUTANEOUS
  Filled 2023-03-26 (×3): qty 0.21

## 2023-03-26 MED ORDER — ATORVASTATIN CALCIUM 40 MG PO TABS
40.0000 mg | ORAL_TABLET | Freq: Every day | ORAL | Status: DC
  Administered 2023-03-27 – 2023-03-28 (×2): 40 mg via ORAL
  Filled 2023-03-26 (×3): qty 1

## 2023-03-26 MED ORDER — SODIUM CHLORIDE 0.9 % IV SOLN
1.0000 g | INTRAVENOUS | Status: DC
  Administered 2023-03-27 – 2023-03-28 (×2): 1 g via INTRAVENOUS
  Filled 2023-03-26 (×2): qty 10

## 2023-03-26 MED ORDER — ASPIRIN 81 MG PO TBEC
81.0000 mg | DELAYED_RELEASE_TABLET | Freq: Every day | ORAL | Status: DC
  Administered 2023-03-27 – 2023-03-28 (×2): 81 mg via ORAL
  Filled 2023-03-26 (×3): qty 1

## 2023-03-26 MED ORDER — ACETAMINOPHEN 325 MG PO TABS
650.0000 mg | ORAL_TABLET | Freq: Four times a day (QID) | ORAL | Status: DC | PRN
  Administered 2023-03-26: 650 mg via ORAL
  Filled 2023-03-26: qty 2

## 2023-03-26 MED ORDER — AMLODIPINE BESYLATE 5 MG PO TABS
10.0000 mg | ORAL_TABLET | Freq: Every day | ORAL | Status: DC
  Administered 2023-03-27 – 2023-03-28 (×2): 10 mg via ORAL
  Filled 2023-03-26 (×3): qty 2

## 2023-03-26 MED ORDER — OXYCODONE HCL 5 MG PO TABS
5.0000 mg | ORAL_TABLET | Freq: Four times a day (QID) | ORAL | Status: DC | PRN
  Administered 2023-03-26: 5 mg via ORAL
  Filled 2023-03-26: qty 1

## 2023-03-26 MED ORDER — ONDANSETRON HCL 4 MG PO TABS: 4.0000 mg | ORAL_TABLET | Freq: Four times a day (QID) | ORAL | Status: DC | PRN

## 2023-03-26 MED ORDER — ENALAPRIL MALEATE 10 MG PO TABS
10.0000 mg | ORAL_TABLET | Freq: Every day | ORAL | Status: DC
  Administered 2023-03-27 – 2023-03-28 (×2): 10 mg via ORAL
  Filled 2023-03-26 (×3): qty 1

## 2023-03-26 MED ORDER — IOHEXOL 350 MG/ML SOLN
50.0000 mL | Freq: Once | INTRAVENOUS | Status: AC | PRN
  Administered 2023-03-26: 50 mL via INTRAVENOUS

## 2023-03-26 MED ORDER — SODIUM CHLORIDE 0.9 % IV SOLN
2.0000 g | Freq: Once | INTRAVENOUS | Status: AC
  Administered 2023-03-26: 2 g via INTRAVENOUS
  Filled 2023-03-26: qty 20

## 2023-03-26 MED ORDER — HEPARIN SODIUM (PORCINE) 5000 UNIT/ML IJ SOLN
5000.0000 [IU] | Freq: Three times a day (TID) | INTRAMUSCULAR | Status: DC
  Administered 2023-03-26 – 2023-03-28 (×5): 5000 [IU] via SUBCUTANEOUS
  Filled 2023-03-26 (×4): qty 1

## 2023-03-26 NOTE — Telephone Encounter (Signed)
Patient's wife contacted the office concerned about symptoms of fever of 102.4 and chills patient was experiencing. She states that it "just started" and she has since given Tylenol. Patient is s/p RATS lobectomy 03/18/23. She also states that his incisions look ok and has a dry cough. He did earlier today complain of his lower abdomen hurting but has since subsided with no other pain elsewhere. He is using his incentive spirometer as directed. She also states that he has been feeling weak. Advised to take patient to ED for further evaluation. She acknowledged receipt.

## 2023-03-26 NOTE — Transitions of Care (Post Inpatient/ED Visit) (Signed)
03/26/2023  Name: William Mcguire MRN: [id number] DOB: May 05, 1956  Today's TOC FU Call Status: Today's TOC FU Call Status:: Successful TOC FU Call Completed TOC FU Call Complete Date: 03/26/23 Patient's Name and Date of Birth confirmed.  Transition Care Management Follow-up Telephone Call Date of Discharge: 03/24/23 Discharge Facility: [facility]) Type of Discharge: Inpatient Admission Primary Inpatient Discharge Diagnosis:: Partial Left lobectomy How have you been since you were released from the hospital?: Better Any questions or concerns?: No  Items Reviewed: Did you receive and understand the discharge instructions provided?: Yes Medications obtained,verified, and reconciled?: Yes (Medications Reviewed) (Only new med Oxy PRN for pain uses 1 x day with effect , Clarified Metformin is 2 x day pt reports taking 1 x day) Any new allergies since your discharge?: No Dietary orders reviewed?: Yes Type of Diet Ordered:: Reg texture NAS CCHO Do you have support at home?: Yes People in Home: spouse Name of Support/Comfort Primary Source: Lives with wife  Medications Reviewed Today: Medications Reviewed Today     Reviewed by Johnnette Barrios, RN (Registered Nurse) on 03/26/23 at 1452  Med List Status: <None>   Medication Order Taking? Sig Documenting Provider Last Dose Status Informant  amLODipine (NORVASC) 10 MG tablet [id number] Yes TAKE 1 TABLET BY MOUTH EVERY DAY [name], MD Taking Active   aspirin EC 81 MG tablet [id number] Yes Take 81 mg by mouth daily. Swallow whole. [provider] Taking Active Self  atorvastatin (LIPITOR) 40 MG tablet [id number] Yes TAKE 1 TABLET BY MOUTH EVERY DAY [name], MD Taking Active   cyanocobalamin (VITAMIN B12) 1000 MCG/ML injection [id number] Yes INJECT 1 ML (1,000 MCG TOTAL) INTO THE MUSCLE EVERY 30 DAYS. [name], MD Taking Active Self           Med Note Sharon Seller, Ruchel Brandenburger Elbert Ewings   Thu Mar 26, 2023  2:51 PM) Spouse  administers at home   Dulaglutide 1.5 MG/0.5ML [facility] [id number] Yes Inject 1.5 mg into the skin once a week. [name], MD Taking Active Self           Med Note Georgann Housekeeper Mar 05, 2023  8:47 AM) Sundays   enalapril (VASOTEC) 10 MG tablet [id number] Yes TAKE 1 TABLET BY MOUTH EVERY DAY [name], MD Taking Active Self  glucose blood [facility] VERIO) test strip [id number] Yes Use as instructed to check blood sugars twice daily E11.69 [name], MD Taking Active Self           Med Note Sharon Seller, Nalaya Wojdyla L   Thu Mar 26, 2023  2:51 PM) States he checks 1 x day vs 2   glucose blood test strip [id number] Yes Use as instructed to test blood sugar 3 times a day. 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 [name], MD Taking Active            Med Note Cinda Quest Mar 18, 2023  6:06 AM) Patient states he took 10 units at 11 pm on 03/17/23  metFORMIN (GLUCOPHAGE) 1000 MG tablet [id number] Yes TAKE 1 TABLET BY MOUTH TWICE A DAY WITH FOOD [name], MD Taking Active Self           Med Note Sharon Seller, Juliza Machnik L   Thu Mar 26, 2023  2:52 PM) Reports he takes 1 x day Clarified  medication to to  be taken 2 x day   oxyCODONE (OXY IR/ROXICODONE) 5 MG immediate release tablet [id number] Yes Take 1 tablet (5 mg total) by mouth every 6 (six) hours as needed for moderate pain. Stehler, Oren Bracket, PA-C Taking Active   sildenafil (VIAGRA) 100 MG tablet [id number] Yes TAKE 1 TABLET BY MOUTH EVERY DAY AS NEEDED (INSURANCE COVERS 10 TAB PER 77DAYS) [name], MD Taking Active Self  SYRINGE-NEEDLE, DISP, 3 ML (B-D INTEGRA SYRINGE) 25G X 5/8" 3 ML MISC [id number] Yes USE AS DIRECTED [name], MD Taking Active Self            Home Care and Equipment/Supplies: Were Home Health Services Ordered?: No Any new equipment or medical supplies ordered?: No  Functional  Questionnaire: Do you need assistance with bathing/showering or dressing?: No Do you need assistance with meal preparation?: No Do you need assistance with eating?: No Do you have difficulty maintaining continence: No Do you need assistance with getting out of bed/getting out of a chair/moving?: No Do you have difficulty managing or taking your medications?: No (Keepsin original ottles)  Follow up appointments reviewed: PCP Follow-up appointment confirmed?: Yes Date of PCP follow-up appointment?: 04/08/23 (No PCP appt scheduled Appt made with assisance if care Guides PCP did not have availablitiy He will be seeing NP in practice) Follow-up Provider: Pay Moshe Salisbury, NP Specialist Hospital Follow-up appointment confirmed?: Yes Date of Specialist follow-up appointment?: 04/03/23 Follow-Up Specialty Provider:: Cardiothoracic Surgery Do you need transportation to your follow-up appointment?: No Do you understand care options if your condition(s) worsen?: Yes-patient verbalized understanding  SDOH Interventions Today    Flowsheet Row Most Recent Value  SDOH Interventions   Housing Interventions Intervention Not Indicated  Transportation Interventions Intervention Not Indicated  Health Literacy Interventions Intervention Not Indicated       Goals Addressed             This Visit's Progress    TOC CarePlan       Current Barriers:  Chronic Disease Management support and 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   RNCM Clinical Goal(s):  Patient will work with the Care Management team over the next 30 days to address Transition of Care Barriers: Medication Management Provider appointments take all medications exactly as prescribed and will call provider for medication related questions as evidenced by no missed medications, medications are taken as prescribed( dose, frequency)  attend all scheduled medical appointments: post surgical follow-up , PCP and Oncology if indicated as  evidenced by no missed appointments   through collaboration with RN Care manager, provider, and care team.   Interventions: Evaluation of current treatment plan related to  self management and patient's adherence to plan as established by provider  Transitions of Care:  New goal. Doctor Visits  - discussed the importance of doctor visits Arranged PCP follow-up within 12-14 days (Care Guide Scheduled)  Patient Goals/Self-Care Activities: Participate in Transition of Care Program/Attend [facility] scheduled calls Take all medications as prescribed Attend all scheduled provider appointments Call pharmacy for medication refills 3-7 days in advance of running out of medications work with the Care Management team over the next 30 days to address Transition of Care Barriers: Medication Management, post procedural follow-up  Follow Up Plan:     Next scheduled visit with TOC Management tea, 04/02/23 9:30am       Susa Loffler , BSN, RN Care Management Coordinator [facility]   [facility] [email or web address] Direct Dial: [phone]

## 2023-03-26 NOTE — ED Provider Notes (Signed)
[place] EMERGENCY DEPARTMENT AT [facility] Provider Note   CSN: [id number] Arrival date & time: 03/26/23  1652     History  Chief Complaint  Patient presents with   Shortness of Breath    William Mcguire is a 67 y.o. male.  HPI 67 year old male with past medical history of CKD, hypertension, diabetes, recent admission for left upper lobectomy presenting for evaluation of fever and chill.  Patient reports undergoing left lobe lobectomy on 03/18/2023.  He had his chest tube removed 4 days ago, was discharged from the hospital 3 days ago.  He did well on the day of discharge and yesterday, but beginning today wife noticed that he seemed to be shaking and have the chills.  She took his temperature orally which was noted to be 102.4.  She gave him Tylenol and then contacted his surgery team, who instructed him to come to the emergency department for evaluation.  Patient denies any headache, nausea, vomiting, chest pain, shortness of breath, abdominal pain, pain with urination, urinary frequency.    Home Medications Prior to Admission medications   Medication Sig Start Date End Date Taking? Authorizing Provider  amLODipine (NORVASC) 10 MG tablet TAKE 1 TABLET BY MOUTH EVERY DAY 03/18/23  Yes [name], MD  aspirin EC 81 MG tablet Take 81 mg by mouth daily. Swallow whole.   Yes [provider]  atorvastatin (LIPITOR) 40 MG tablet TAKE 1 TABLET BY MOUTH EVERY DAY 03/25/23  Yes [name], MD  cyanocobalamin (VITAMIN B12) 1000 MCG/ML injection INJECT 1 ML (1,000 MCG TOTAL) INTO THE MUSCLE EVERY 30 DAYS. 12/10/22  Yes [name], MD  insulin glargine (LANTUS) 100 UNIT/ML injection Inject 21 Units into the skin at bedtime.   Yes [provider]  metFORMIN (GLUCOPHAGE) 1000 MG tablet TAKE 1 TABLET BY MOUTH TWICE A DAY WITH FOOD 01/23/23  Yes [name], MD  oxyCODONE (OXY IR/ROXICODONE) 5 MG immediate release tablet Take 1 tablet (5 mg total) by mouth  every 6 (six) hours as needed for moderate pain. 03/23/23  Yes Stehler, Oren Bracket, PA-C  sildenafil (VIAGRA) 100 MG tablet TAKE 1 TABLET BY MOUTH EVERY DAY AS NEEDED (INSURANCE COVERS 10 TAB PER 77DAYS) 02/13/23  Yes [name], MD  glucose blood (ONETOUCH VERIO) test strip Use as instructed to check blood sugars twice daily E11.69 11/26/21   [name], MD  glucose blood test strip Use as instructed to test blood sugar 3 times a day. 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    Review of Systems   Review of Systems  Physical Exam Updated Vital Signs BP (!) 156/68 (BP Location: Left Arm)   Pulse 76   Temp (!) 100.9 F (38.3 C) (Oral)   Resp 18   Ht 6\' 3"  (1.905 m)   Wt 111.1 kg   SpO2 100%   BMI 30.62 kg/m  Physical Exam Constitutional:      General: He is not in acute distress.    Appearance: He is not ill-appearing.  HENT:     Head: Normocephalic.     Right Ear: External ear normal.     Left Ear: External ear normal.     Nose: Nose normal.     Mouth/Throat:     Mouth:  Mucous membranes are moist.     Pharynx: Oropharynx is clear.  Eyes:     Extraocular Movements: Extraocular movements intact.     Conjunctiva/sclera: Conjunctivae normal.  Cardiovascular:     Rate and Rhythm: Normal rate and regular rhythm.     Pulses: Normal pulses.  Pulmonary:     Effort: Pulmonary effort is normal. No respiratory distress.     Breath sounds: No wheezing.  Abdominal:     General: Abdomen is flat.     Palpations: Abdomen is soft.     Tenderness: There is no abdominal tenderness. There is no guarding or rebound.  Musculoskeletal:        General: Normal range of motion.     Right lower leg: No edema.     Left lower leg: No edema.  Skin:    General: Skin is warm and dry.     Findings: No rash.     Comments:  Well-healed chest tube sites and surgical scars on left upper abdomen and left mid thoracic back.  No overlying erythema, drainage.  Nontender to palpation  Neurological:     General: No focal deficit present.     Mental Status: He is alert.     Cranial Nerves: No cranial nerve deficit.     Motor: No weakness.     ED Results / Procedures / Treatments   Labs (all labs ordered are listed, but only abnormal results are displayed) Labs Reviewed  COMPREHENSIVE METABOLIC PANEL - Abnormal; Notable for the following components:      Result Value   Sodium 133 (*)    Glucose, Bld 162 (*)    Creatinine, Ser 1.51 (*)    Albumin 3.0 (*)    AST 58 (*)    ALT 83 (*)    GFR, Estimated 50 (*)    All other components within normal limits  CBC WITH DIFFERENTIAL/PLATELET - Abnormal; Notable for the following components:   WBC 12.5 (*)    RBC 3.40 (*)    Hemoglobin 9.7 (*)    HCT 30.5 (*)    Neutro Abs 10.2 (*)    Monocytes Absolute 1.1 (*)    Abs Immature Granulocytes 0.14 (*)    All other components within normal limits  URINALYSIS, W/ REFLEX TO CULTURE (INFECTION SUSPECTED) - Abnormal; Notable for the following components:   Color, Urine AMBER (*)    APPearance CLOUDY (*)    Hgb urine dipstick SMALL (*)    Bilirubin Urine SMALL (*)    Protein, ur 30 (*)    Leukocytes,Ua LARGE (*)    Bacteria, UA MANY (*)    All other components within normal limits  GLUCOSE, CAPILLARY - Abnormal; Notable for the following components:   Glucose-Capillary 106 (*)    All other components within normal limits  I-STAT CG4 LACTIC ACID, ED - Abnormal; Notable for the following components:   Lactic Acid, Venous 2.0 (*)    All other components within normal limits  RESP PANEL BY RT-PCR (RSV, FLU A&B, COVID)  [id number]  URINE CULTURE  CULTURE, BLOOD (ROUTINE X 2)  CULTURE, BLOOD (ROUTINE X 2)  COMPREHENSIVE METABOLIC PANEL  MAGNESIUM  CBC WITH DIFFERENTIAL/PLATELET  I-STAT CG4 LACTIC ACID, ED  CBG MONITORING,  ED    EKG EKG Interpretation Date/Time:  Thursday March 26 2023 17:19:46 EDT Ventricular Rate:  80 PR Interval:  196 QRS Duration:  86 QT Interval:  352 QTC Calculation: 405 R Axis:   47  Text Interpretation: Normal  sinus rhythm Normal ECG When compared with ECG of 16-Mar-2023 13:44, PREVIOUS ECG IS PRESENT Confirmed by Margarita Grizzle [phone]) on 03/26/2023 5:36:24 PM  Radiology CT Chest W Contrast  Result Date: 03/26/2023 CLINICAL DATA:  Lung/mediastinal abscess recent L lobectomy, new fevers EXAM: CT CHEST WITH CONTRAST TECHNIQUE: Multidetector CT imaging of the chest was performed during intravenous contrast administration. RADIATION DOSE REDUCTION: This exam was performed according to the departmental dose-optimization program which includes automated exposure control, adjustment of the mA and/or kV according to patient size and/or use of iterative reconstruction technique. CONTRAST:  50mL OMNIPAQUE IOHEXOL 350 MG/ML SOLN COMPARISON:  Chest x-ray today.  CT 03/12/2023 FINDINGS: Cardiovascular: Heart is normal size. Aorta is normal caliber. Mediastinum/Nodes: No mediastinal, hilar, or axillary adenopathy. Trachea and esophagus are unremarkable. Multiple nodules within the thyroid, the largest 2 cm in the anterior left thyroid towards the isthmus. This has been evaluated on previous imaging. (ref: J Am Coll Radiol. 2015 Feb;12(2): 143-50). Lungs/Pleura: Prior left upper lobectomy. Small to moderate hydropneumothorax. Right lung clear. No effusion on the right. Upper Abdomen: No acute findings Musculoskeletal: Chest wall soft tissues are unremarkable. No acute bony abnormality. IMPRESSION: Prior left upper lobectomy. Small to moderate left hydropneumothorax. Electronically Signed   By: Charlett Nose M.D.   On: 03/26/2023 21:09   DG Chest 2 View  Result Date: 03/26/2023 CLINICAL DATA:  Shortness of breath and cough, initial encounter EXAM: CHEST - 2 VIEW COMPARISON:  03/24/2023 FINDINGS: Cardiac  shadow is within normal limits. Small apical pneumothorax is again identified. Better visualized on today's exam is the known left upper lobe mass lesion. Small left-sided pleural effusion is noted. IMPRESSION: Stable left apical pneumothorax. Small left effusion and known anterior left upper lobe mass. Electronically Signed   By: Alcide Clever M.D.   On: 03/26/2023 19:16    Procedures Procedures    Medications Ordered in ED Medications  insulin aspart (novoLOG) injection 0-15 Units (has no administration in time range)  insulin aspart (novoLOG) injection 0-5 Units ( Subcutaneous Not Given 03/26/23 2108)  heparin injection 5,000 Units (5,000 Units Subcutaneous Given 03/26/23 2103)  acetaminophen (TYLENOL) tablet 650 mg (650 mg Oral Given 03/26/23 2219)    Or  acetaminophen (TYLENOL) suppository 650 mg ( Rectal See Alternative 03/26/23 2219)  senna-docusate (Senokot-S) tablet 1 tablet (has no administration in time range)  ondansetron (ZOFRAN) tablet 4 mg (has no administration in time range)    Or  ondansetron (ZOFRAN) injection 4 mg (has no administration in time range)  cefTRIAXone (ROCEPHIN) 1 g in sodium chloride 0.9 % 100 mL IVPB (has no administration in time range)  amLODipine (NORVASC) tablet 10 mg (has no administration in time range)  atorvastatin (LIPITOR) tablet 40 mg (has no administration in time range)  aspirin EC tablet 81 mg (has no administration in time range)  enalapril (VASOTEC) tablet 10 mg (has no administration in time range)  insulin glargine-yfgn (SEMGLEE) injection 21 Units (has no administration in time range)  oxyCODONE (Oxy IR/ROXICODONE) immediate release tablet 5 mg (5 mg Oral Given 03/26/23 2148)  cefTRIAXone (ROCEPHIN) 2 g in sodium chloride 0.9 % 100 mL IVPB (0 g Intravenous Stopped 03/26/23 2054)  iohexol (OMNIPAQUE) 350 MG/ML injection 50 mL (50 mLs Intravenous Contrast Given 03/26/23 2044)    ED Course/ Medical Decision Making/ A&P  Medical Decision Making Amount and/or Complexity of Data Reviewed Labs: ordered. Radiology: ordered.  Risk Prescription drug management. Decision regarding hospitalization.   67 year old male with past medical history and HPI as above.  Vital signs stable upon arrival.  Patient notably afebrile, but wife administered Tylenol prior to coming to the emergency department.  Examination is nonfocal without any obvious source of infection.  Specifically, his chest wall surgical sites are clean, dry, nonerythematous, and nontender.  Patient's recent hospital records from 03/18/2023 through 03/24/2023 were reviewed.  He had a chest tube placed after his left upper lobectomy on 03/18/2023.  He had an expected amount of blood loss anemia which stabilized over time.  At time of discharge, chest x-ray showed only a trace apical pneumothorax that was stable.    On lab work, patient does have a mild leukocytosis of 12.5 with neutrophilic predominance.  This is a new leukocytosis from when he was discharged from the hospital.  Hemoglobin is 9.7, actually improved from when he left the hospital.  On metabolic panel, his creatinine is 1.51, consistent with his baseline.  Patient's urinalysis is consistent with infection.  While initially denying any urinary symptoms, on reassessment, patient states that he has had some lower abdominal pain beginning today, which concerned his wife for UTI.  Given patient's recent chest instrumentation and procedure, CT scan of the chest was ordered to rule out empyema, abscess, or other etiology that could contribute to his fevers.  Cardiothoracic surgery team was made aware the patient has returned and will continue to follow.  CT scan shows small to moderate hydropneumothorax.  Patient will be provided with Rocephin while in the emergency department for urinary tract infection.  He will require admission for febrile UTI as well as monitoring of his recent chest procedure.   Handoff given to inpatient team.    Final Clinical Impression(s) / ED Diagnoses Final diagnoses:  Fever in adult    Rx / DC Orders ED Discharge Orders     None         Lyman Speller, MD 03/26/23 [id number]    Margarita Grizzle, MD 03/30/23 1225

## 2023-03-26 NOTE — Telephone Encounter (Signed)
Called pt and LVM stating that for a test that we ran for pt, we need to obtain a signature from pt in order to send off. Call back number [phone] provided.

## 2023-03-26 NOTE — ED Notes (Signed)
ED TO INPATIENT HANDOFF REPORT  ED Nurse Name and Phone #:  Minerva Areola [phone]  S Name/Age/Gender William Mcguire 67 y.o. male Room/Bed: 022C/022C  Code Status   Code Status: Full Code  Home/SNF/Other Home Patient oriented to: self, place, time, and situation Is this baseline? Yes   Triage Complete: Triage complete  Chief Complaint Acute cystitis [N30.00]  Triage Note Pt c/o chills and fever x 30 minutes, non productive cough; had L lung nodule removal last week, some pain at site, otherwise unremarkable; endorses some sob, worse when lying down; denies known sick contacts   Allergies No Known Allergies  Level of Care/Admitting Diagnosis ED Disposition     ED Disposition  Admit   Condition  --   Comment  Hospital Area: [facility] [[id number]]  Level of Care: Med-Surg [16]  May admit patient to [facility] or [facility] if equivalent level of care is available:: Yes  Covid Evaluation: Asymptomatic - no recent exposure (last 10 days) testing not required  Diagnosis: Acute cystitis [595.0.ICD-9-CM]  Admitting Physician: Gery Pray [[id number]]  Attending Physician: Gery Pray [phone]  Certification:: I certify this patient will need inpatient services for at least 2 midnights  Expected Medical Readiness: 03/28/2023          B Medical/Surgery History Past Medical History:  Diagnosis Date   Diabetes mellitus    High cholesterol    Hypertension    Past Surgical History:  Procedure Laterality Date   BRONCHIAL BIOPSY  01/05/2023   Procedure: BRONCHIAL BIOPSIES;  Surgeon: Josephine Igo, DO;  [facility];  Service: Pulmonary;;   BRONCHIAL BRUSHINGS  01/05/2023   Procedure: BRONCHIAL BRUSHINGS;  Surgeon: Josephine Igo, DO;  [facility];  Service: Pulmonary;;   BRONCHIAL NEEDLE ASPIRATION BIOPSY  01/05/2023   Procedure: BRONCHIAL NEEDLE ASPIRATION BIOPSIES;  Surgeon: Josephine Igo, DO;  Location: [facility];  Service:  Pulmonary;;   INTERCOSTAL NERVE BLOCK Left 03/18/2023   Procedure: INTERCOSTAL NERVE BLOCK;  Surgeon: Corliss Skains, MD;  Location: [facility];  Service: Thoracic;  Laterality: Left;   KNEE SURGERY Left 2004   torn cartilage   LYMPH NODE BIOPSY Left 03/18/2023   Procedure: LYMPH NODE BIOPSY;  Surgeon: Corliss Skains, MD;  Location: [facility];  Service: Thoracic;  Laterality: Left;   PATELLAR TENDON REPAIR  06/11/2011   Procedure: PATELLA TENDON REPAIR;  Surgeon: Cammy Copa;  Location: [facility];  Service: Orthopedics;  Laterality: Right;     A IV Location/Drains/Wounds Patient Lines/Drains/Airways Status     Active Line/Drains/Airways     Name Placement date Placement time Site Days   Peripheral IV 03/26/23 20 G Anterior;Distal;Right;Upper Arm 03/26/23  2015  Arm  less than 1            Intake/Output Last 24 hours No intake or output data in the 24 hours ending 03/26/23 2054  Labs/Imaging Results for orders placed or performed during the hospital encounter of 03/26/23 (from the past 48 hour(s))  Urinalysis, w/ Reflex to Culture (Infection Suspected) -Urine, Clean Catch     Status: Abnormal   Collection Time: 03/26/23  5:11 PM  Result Value Ref Range   Specimen Source URINE, CLEAN CATCH    Color, Urine AMBER (A) YELLOW    Comment: BIOCHEMICALS MAY BE AFFECTED BY COLOR   APPearance CLOUDY (A) CLEAR   Specific Gravity, Urine 1.027 1.005 - 1.030   pH 5.0 5.0 - 8.0   Glucose, UA NEGATIVE NEGATIVE mg/dL  Hgb urine dipstick SMALL (A) NEGATIVE   Bilirubin Urine SMALL (A) NEGATIVE   Ketones, ur NEGATIVE NEGATIVE mg/dL   Protein, ur 30 (A) NEGATIVE mg/dL   Nitrite NEGATIVE NEGATIVE   Leukocytes,Ua LARGE (A) NEGATIVE   RBC / HPF 11-20 0 - 5 RBC/hpf   WBC, UA >50 0 - 5 WBC/hpf    Comment:        Reflex urine culture not performed if WBC <=10, OR if Squamous epithelial cells >5. If Squamous epithelial cells >5 suggest recollection.    Bacteria, UA MANY (A) NONE SEEN    Squamous Epithelial / HPF 0-5 0 - 5 /HPF   WBC Clumps PRESENT    Mucus PRESENT     Comment: Performed at [facility], [address] [place], [place] [address]  Comprehensive metabolic panel     Status: Abnormal   Collection Time: 03/26/23  5:16 PM  Result Value Ref Range   Sodium 133 (L) 135 - 145 mmol/L   Potassium 4.4 3.5 - 5.1 mmol/L   Chloride 101 98 - 111 mmol/L   CO2 23 22 - 32 mmol/L   Glucose, Bld 162 (H) 70 - 99 mg/dL    Comment: Glucose reference range applies only to samples taken after fasting for at least 8 hours.   BUN 18 8 - 23 mg/dL   Creatinine, Ser 6.04 (H) 0.61 - 1.24 mg/dL   Calcium 9.0 8.9 - 54.0 mg/dL   Total Protein 8.1 6.5 - 8.1 g/dL   Albumin 3.0 (L) 3.5 - 5.0 g/dL   AST 58 (H) 15 - 41 U/L   ALT 83 (H) 0 - 44 U/L   Alkaline Phosphatase 84 38 - 126 U/L   Total Bilirubin 0.5 0.3 - 1.2 mg/dL   GFR, Estimated 50 (L) >60 mL/min    Comment: (NOTE) Calculated using the CKD-EPI Creatinine Equation (2021)    Anion gap 9 5 - 15    Comment: Performed at [facility], [address] [place], [place] [address]  CBC with Differential     Status: Abnormal   Collection Time: 03/26/23  5:16 PM  Result Value Ref Range   WBC 12.5 (H) 4.0 - 10.5 K/uL   RBC 3.40 (L) 4.22 - 5.81 MIL/uL   Hemoglobin 9.7 (L) 13.0 - 17.0 g/dL   HCT 14.7 (L) 82.9 - 56.2 %   MCV 89.7 80.0 - 100.0 fL   MCH 28.5 26.0 - 34.0 pg   MCHC 31.8 30.0 - 36.0 g/dL   RDW 13.0 86.5 - 78.4 %   Platelets 267 150 - 400 K/uL   nRBC 0.0 0.0 - 0.2 %   Neutrophils Relative % 81 %   Neutro Abs 10.2 (H) 1.7 - 7.7 K/uL   Lymphocytes Relative 8 %   Lymphs Abs 1.0 0.7 - 4.0 K/uL   Monocytes Relative 9 %   Monocytes Absolute 1.1 (H) 0.1 - 1.0 K/uL   Eosinophils Relative 1 %   Eosinophils Absolute 0.1 0.0 - 0.5 K/uL   Basophils Relative 0 %   Basophils Absolute 0.0 0.0 - 0.1 K/uL   Immature Granulocytes 1 %   Abs Immature Granulocytes 0.14 (H) 0.00 - 0.07 K/uL    Comment: Performed at [facility], [address] [place], [place] [address]  I-Stat Lactic Acid, ED     Status: Abnormal   Collection Time: 03/26/23  5:25 PM  Result Value Ref Range   Lactic Acid, Venous 2.0 (  HH) 0.5 - 1.9 mmol/L  I-Stat Lactic Acid, ED     Status: None   Collection Time: 03/26/23  8:15 PM  Result Value Ref Range   Lactic Acid, Venous 1.1 0.5 - 1.9 mmol/L   DG Chest 2 View  Result Date: 03/26/2023 CLINICAL DATA:  Shortness of breath and cough, initial encounter EXAM: CHEST - 2 VIEW COMPARISON:  03/24/2023 FINDINGS: Cardiac shadow is within normal limits. Small apical pneumothorax is again identified. Better visualized on today's exam is the known left upper lobe mass lesion. Small left-sided pleural effusion is noted. IMPRESSION: Stable left apical pneumothorax. Small left effusion and known anterior left upper lobe mass. Electronically Signed   By: Alcide Clever M.D.   On: 03/26/2023 19:16    Pending Labs Unresulted Labs (From admission, onward)     Start     Ordered   03/27/23 0500  Comprehensive metabolic panel  Tomorrow morning,   R        03/26/23 2045   03/27/23 0500  Magnesium  Tomorrow morning,   R        03/26/23 2045   03/27/23 0500  CBC with Differential/Platelet  Tomorrow morning,   R        03/26/23 2045   03/26/23 2044  Culture, blood (Routine X 2) w Reflex to ID Panel  BLOOD CULTURE X 2,   R (with TIMED occurrences)      03/26/23 2043   03/26/23 1712  Resp panel by RT-PCR (RSV, Flu A&B, Covid) Anterior Nasal Swab  Once,   URGENT        03/26/23 1711   03/26/23 1711  Urine Culture  Once,   R        03/26/23 1711            Vitals/Pain Today's Vitals   03/26/23 1708 03/26/23 1936 03/26/23 2023 03/26/23 2025  BP:  (!) 143/74    Pulse:  69    Resp:  (!) 24    Temp:  99.3 F (37.4 C)    TempSrc:  Oral    SpO2:  100%    Weight:    111.1 kg  Height:    6\' 3"  (1.905 m)  PainSc: 5   5      Isolation Precautions No active isolations  Medications Medications   insulin aspart (novoLOG) injection 0-15 Units (has no administration in time range)  insulin aspart (novoLOG) injection 0-5 Units (has no administration in time range)  heparin injection 5,000 Units (has no administration in time range)  acetaminophen (TYLENOL) tablet 650 mg (has no administration in time range)    Or  acetaminophen (TYLENOL) suppository 650 mg (has no administration in time range)  senna-docusate (Senokot-S) tablet 1 tablet (has no administration in time range)  ondansetron (ZOFRAN) tablet 4 mg (has no administration in time range)    Or  ondansetron (ZOFRAN) injection 4 mg (has no administration in time range)  cefTRIAXone (ROCEPHIN) 1 g in sodium chloride 0.9 % 100 mL IVPB (has no administration in time range)  cefTRIAXone (ROCEPHIN) 2 g in sodium chloride 0.9 % 100 mL IVPB (0 g Intravenous Stopped 03/26/23 2054)  iohexol (OMNIPAQUE) 350 MG/ML injection 50 mL (50 mLs Intravenous Contrast Given 03/26/23 2044)    Mobility walks     Focused Assessments Cardiac Assessment Handoff:    No results found for: "CKTOTAL", "CKMB", "CKMBINDEX", "TROPONINI" No results found for: "DDIMER" Does the Patient currently have chest pain? No    R  Recommendations: See Admitting Provider Note  Report given to:   Additional Notes:  cooperative cultures sent

## 2023-03-26 NOTE — H&P (Signed)
PCP:   [name], MD   Chief Complaint:  Weakness  HPI: This is a 67 year old male with past medical history of T2 DM, HLD, HTN, left lung cancer.  Patient is s/p LUL lobectomy 03/18/2023.  Oncology follow follow-up pending, currently not on chemotherapy.  Patient presents after he woke this a.m. with no energy at all.  Over the course of the day he spiked a fever few times and had chills.  Per patient his breathing felt labored for brief time.  He denies cough, wheezing, nausea, vomiting, diarrhea, burning urination.  He also denies flank discomfort or a foul odor to his urine.  He came to the ER.  In the ER patient's Tmax of 100.9.  Vitals otherwise stable.  Respiratory panel negative.  UA consistent with an infection.  Blood cultures x 2 and urine cultures collected.  Patient given IV antibiotics.  Review of Systems:  Per HPI  Past Medical History: Past Medical History:  Diagnosis Date   Diabetes mellitus    High cholesterol    Hypertension    Past Surgical History:  Procedure Laterality Date   BRONCHIAL BIOPSY  01/05/2023   Procedure: BRONCHIAL BIOPSIES;  Surgeon: Josephine Igo, DO;  [facility];  Service: Pulmonary;;   BRONCHIAL BRUSHINGS  01/05/2023   Procedure: BRONCHIAL BRUSHINGS;  Surgeon: Josephine Igo, DO;  [facility];  Service: Pulmonary;;   BRONCHIAL NEEDLE ASPIRATION BIOPSY  01/05/2023   Procedure: BRONCHIAL NEEDLE ASPIRATION BIOPSIES;  Surgeon: Josephine Igo, DO;  [facility];  Service: Pulmonary;;   INTERCOSTAL NERVE BLOCK Left 03/18/2023   Procedure: INTERCOSTAL NERVE BLOCK;  Surgeon: Corliss Skains, MD;  [facility];  Service: Thoracic;  Laterality: Left;   KNEE SURGERY Left 2004   torn cartilage   LYMPH NODE BIOPSY Left 03/18/2023   Procedure: LYMPH NODE BIOPSY;  Surgeon: Corliss Skains, MD;  [facility];  Service: Thoracic;  Laterality: Left;   PATELLAR TENDON REPAIR  06/11/2011   Procedure: PATELLA  TENDON REPAIR;  Surgeon: Cammy Copa;  Location: [facility];  Service: Orthopedics;  Laterality: Right;    Medications: Prior to Admission medications   Medication Sig Start Date End Date Taking? Authorizing Provider  amLODipine (NORVASC) 10 MG tablet TAKE 1 TABLET BY MOUTH EVERY DAY 03/18/23   [name], MD  aspirin EC 81 MG tablet Take 81 mg by mouth daily. Swallow whole.    [provider]  atorvastatin (LIPITOR) 40 MG tablet TAKE 1 TABLET BY MOUTH EVERY DAY 03/25/23   [name], MD  cyanocobalamin (VITAMIN B12) 1000 MCG/ML injection INJECT 1 ML (1,000 MCG TOTAL) INTO THE MUSCLE EVERY 30 DAYS. 12/10/22   [name], MD  Dulaglutide 1.5 MG/0.5ML SOPN Inject 1.5 mg into the skin once a week. 09/03/22   [name], MD  enalapril (VASOTEC) 10 MG tablet TAKE 1 TABLET BY MOUTH EVERY DAY 06/26/22   [name], MD  glucose blood [facility] VERIO) test strip Use as instructed to check blood sugars twice daily E11.69 11/26/21   [name], MD  glucose blood test strip Use as instructed to test blood sugar 3 times a day. Dx code: e11.65 11/26/21   [name], MD  Insulin [facility]) 100 UNIT/ML Inject 21 Units into the skin at bedtime. 03/10/23   [name], MD  metFORMIN (GLUCOPHAGE) 1000 MG tablet TAKE 1 TABLET BY MOUTH TWICE A DAY WITH FOOD 01/23/23   [name], MD  oxyCODONE (OXY IR/ROXICODONE)  5 MG immediate release tablet Take 1 tablet (5 mg total) by mouth every 6 (six) hours as needed for moderate pain. 03/23/23   Stehler, Oren Bracket, PA-C  sildenafil (VIAGRA) 100 MG tablet TAKE 1 TABLET BY MOUTH EVERY DAY AS NEEDED (INSURANCE COVERS 10 TAB PER 77DAYS) 02/13/23   [name], MD  SYRINGE-NEEDLE, DISP, 3 ML (B-D INTEGRA SYRINGE) 25G X 5/8" 3 ML MISC USE AS DIRECTED 03/12/22   [name], MD    Allergies:  No Known Allergies  Social History:  reports that he has never smoked. He has never used smokeless tobacco. He reports that he does not  drink alcohol and does not use drugs.  Family History: Family History  Problem Relation Age of Onset   Hypertension Mother    Multiple myeloma Mother    Hypertension Father    Diabetes Father     Physical Exam: Vitals:   03/26/23 1700 03/26/23 1936 03/26/23 2025  BP: 129/64 (!) 143/74   Pulse: 88 69   Resp: 16 (!) 24   Temp: 99.7 F (37.6 C) 99.3 F (37.4 C)   TempSrc: Oral Oral   SpO2: 99% 100%   Weight:   111.1 kg  Height:   6\' 3"  (1.905 m)    General: A&O x 3, weak, spent appearing gentleman, in no distress Eyes: Pink conjunctiva, no scleral icterus ENT: Moist oral mucosa, neck supple, no thyromegaly Lungs: CTA B/L, no wheeze, no crackles, no use of accessory muscles Cardiovascular: RRR, no regurgitation, no gallops, no murmurs. No carotid bruits, no JVD Abdomen: soft, positive BS, NTND not an acute abdomen GU: not examined Neuro: CN II - XII grossly intact, sensation intact Musculoskeletal: strength 5/5 all extremities, no edema Skin: Well healing left-sided posterior along post surgical scars. Sutures noted Psych: appropriate patient  Labs on Admission:  Recent Labs    03/24/23 0318 03/26/23 1716  NA 134* 133*  K 4.2 4.4  CL 103 101  CO2 27 23  GLUCOSE 100* 162*  BUN 7* 18  CREATININE 1.38* 1.51*  CALCIUM 8.8* 9.0   Recent Labs    03/26/23 1716  AST 58*  ALT 83*  ALKPHOS 84  BILITOT 0.5  PROT 8.1  ALBUMIN 3.0*    Recent Labs    03/26/23 1716  WBC 12.5*  NEUTROABS 10.2*  HGB 9.7*  HCT 30.5*  MCV 89.7  PLT 267    Radiological Exams on Admission: DG Chest 2 View  Result Date: 03/26/2023 CLINICAL DATA:  Shortness of breath and cough, initial encounter EXAM: CHEST - 2 VIEW COMPARISON:  03/24/2023 FINDINGS: Cardiac shadow is within normal limits. Small apical pneumothorax is again identified. Better visualized on today's exam is the known left upper lobe mass lesion. Small left-sided pleural effusion is noted. IMPRESSION: Stable left apical  pneumothorax. Small left effusion and known anterior left upper lobe mass. Electronically Signed   By: Alcide Clever M.D.   On: 03/26/2023 19:16    Assessment/Plan Present on Admission:  Acute cystitis  //  lactic acidosis -blood and urine cultures collected -IV Rocephin continued -Lactic acidosis resolved   Weakness/generalized debility -PT/OT consult placed   Chronic renal disease, stage II -Stable at baseline.  Avoid nephrotoxic medications   HTN (hypertension) -norvasc, enalapril reordered   HLD -atorvastatin reordered   T2DM -lantus resumed.  SSI on board   Adenocarcinoma of left lung (HCC) -s/p pneumonevctomy -f/u w/ onc pending -roxicodone resumed  William Mcguire 03/26/2023, 8:48 PM

## 2023-03-26 NOTE — ED Triage Notes (Addendum)
Pt c/o chills and fever x 30 minutes, non productive cough; had L lung nodule removal last week, some pain at site, otherwise unremarkable; endorses some sob, worse when lying down; denies known sick contacts

## 2023-03-27 ENCOUNTER — Ambulatory Visit: Payer: Medicare Other | Admitting: Thoracic Surgery (Cardiothoracic Vascular Surgery)

## 2023-03-27 DIAGNOSIS — N3000 Acute cystitis without hematuria: Secondary | ICD-10-CM | POA: Diagnosis not present

## 2023-03-27 LAB — CBC WITH DIFFERENTIAL/PLATELET
Abs Immature Granulocytes: 0.14 10*3/uL — ABNORMAL HIGH (ref 0.00–0.07)
Basophils Absolute: 0 10*3/uL (ref 0.0–0.1)
Basophils Relative: 0 %
Eosinophils Absolute: 0.1 10*3/uL (ref 0.0–0.5)
Eosinophils Relative: 1 %
HCT: 27.2 % — ABNORMAL LOW (ref 39.0–52.0)
Hemoglobin: 8.8 g/dL — ABNORMAL LOW (ref 13.0–17.0)
Immature Granulocytes: 1 %
Lymphocytes Relative: 14 %
Lymphs Abs: 1.7 10*3/uL (ref 0.7–4.0)
MCH: 29.3 pg (ref 26.0–34.0)
MCHC: 32.4 g/dL (ref 30.0–36.0)
MCV: 90.7 fL (ref 80.0–100.0)
Monocytes Absolute: 1 10*3/uL (ref 0.1–1.0)
Monocytes Relative: 9 %
Neutro Abs: 8.9 10*3/uL — ABNORMAL HIGH (ref 1.7–7.7)
Neutrophils Relative %: 75 %
Platelets: 238 10*3/uL (ref 150–400)
RBC: 3 MIL/uL — ABNORMAL LOW (ref 4.22–5.81)
RDW: 12.9 % (ref 11.5–15.5)
WBC: 11.8 10*3/uL — ABNORMAL HIGH (ref 4.0–10.5)
nRBC: 0 % (ref 0.0–0.2)

## 2023-03-27 LAB — IRON AND TIBC
Iron: 14 ug/dL — ABNORMAL LOW (ref 45–182)
Saturation Ratios: 6 % — ABNORMAL LOW (ref 17.9–39.5)
TIBC: 235 ug/dL — ABNORMAL LOW (ref 250–450)
UIBC: 221 ug/dL

## 2023-03-27 LAB — FOLATE: Folate: 5.5 ng/mL — ABNORMAL LOW (ref >5.9)

## 2023-03-27 LAB — COMPREHENSIVE METABOLIC PANEL
ALT: 65 U/L — ABNORMAL HIGH (ref 0–44)
AST: 38 U/L (ref 15–41)
Albumin: 2.5 g/dL — ABNORMAL LOW (ref 3.5–5.0)
Alkaline Phosphatase: 66 U/L (ref 38–126)
Anion gap: 10 (ref 5–15)
BUN: 20 mg/dL (ref 8–23)
CO2: 25 mmol/L (ref 22–32)
Calcium: 8.5 mg/dL — ABNORMAL LOW (ref 8.9–10.3)
Chloride: 97 mmol/L — ABNORMAL LOW (ref 98–111)
Creatinine, Ser: 1.53 mg/dL — ABNORMAL HIGH (ref 0.61–1.24)
GFR, Estimated: 49 mL/min — ABNORMAL LOW (ref >60)
Glucose, Bld: 169 mg/dL — ABNORMAL HIGH (ref 70–99)
Potassium: 3.7 mmol/L (ref 3.5–5.1)
Sodium: 132 mmol/L — ABNORMAL LOW (ref 135–145)
Total Bilirubin: 0.5 mg/dL (ref 0.3–1.2)
Total Protein: 7.2 g/dL (ref 6.5–8.1)

## 2023-03-27 LAB — RETICULOCYTES
Immature Retic Fract: 15.5 % (ref 2.3–15.9)
RBC.: 3.02 MIL/uL — ABNORMAL LOW (ref 4.22–5.81)
Retic Count, Absolute: 26.6 10*3/uL (ref 19.0–186.0)
Retic Ct Pct: 0.9 % (ref 0.4–3.1)

## 2023-03-27 LAB — GLUCOSE, CAPILLARY
Glucose-Capillary: 140 mg/dL — ABNORMAL HIGH (ref 70–99)
Glucose-Capillary: 181 mg/dL — ABNORMAL HIGH (ref 70–99)
Glucose-Capillary: 182 mg/dL — ABNORMAL HIGH (ref 70–99)
Glucose-Capillary: 231 mg/dL — ABNORMAL HIGH (ref 70–99)

## 2023-03-27 LAB — MAGNESIUM: Magnesium: 2 mg/dL (ref 1.7–2.4)

## 2023-03-27 LAB — FERRITIN: Ferritin: 217 ng/mL (ref 24–336)

## 2023-03-27 LAB — VITAMIN B12: Vitamin B-12: 1195 pg/mL — ABNORMAL HIGH (ref 180–914)

## 2023-03-27 NOTE — Plan of Care (Signed)
  Problem: Pain Management: Goal: Pain level will decrease Outcome: Progressing   Problem: Pain Managment: Goal: General experience of comfort will improve 03/27/2023 2341 by Sammuel Cooper, RN Outcome: Progressing 03/27/2023 2316 by Sammuel Cooper, RN Outcome: Progressing   Problem: Safety: Goal: Ability to remain free from injury will improve 03/27/2023 2341 by Sammuel Cooper, RN Outcome: Progressing 03/27/2023 2316 by Sammuel Cooper, RN Outcome: Progressing

## 2023-03-27 NOTE — Evaluation (Signed)
Occupational Therapy Evaluation Patient Details Name: William Mcguire MRN: [id number] DOB: March 01, 1956 Today's Date: 03/27/2023   History of Present Illness The pt is a 67 yo male presenting 10/3 with onset of fever, chills, and cough. Pt s/p RATS L upper lobectomy on 9/25, d/c home on 10/1. Found with UTI and CT with small to moderate hydropneumothorax. PMH includes: CKD, HTN, HLD, DM II.   Clinical Impression   Patient admitted for above and presents near baseline at modified independent level for ADLs, transfers and functional mobility. Discussed energy conservation and safety for fall prevention.  NO SOB noted during session. No further OT needs have been identified, pt has no further questions or concerns, and OT will sign off. Thank you for this referral!      If plan is discharge home, recommend the following: Assistance with cooking/housework    Functional Status Assessment  Patient has not had a recent decline in their functional status  Equipment Recommendations  None recommended by OT    Recommendations for Other Services       Precautions / Restrictions Precautions Precautions: None Restrictions Weight Bearing Restrictions: No      Mobility Bed Mobility Overal bed mobility: Modified Independent                  Transfers Overall transfer level: Modified independent                        Balance Overall balance assessment: No apparent balance deficits (not formally assessed)                                         ADL either performed or assessed with clinical judgement   ADL Overall ADL's : Modified independent                                             Vision   Vision Assessment?: No apparent visual deficits     Perception         Praxis         Pertinent Vitals/Pain Pain Assessment Pain Assessment: No/denies pain     Extremity/Trunk Assessment Upper Extremity Assessment Upper  Extremity Assessment: Overall WFL for tasks assessed   Lower Extremity Assessment Lower Extremity Assessment: Defer to PT evaluation       Communication Communication Communication: No apparent difficulties   Cognition Arousal: Alert Behavior During Therapy: WFL for tasks assessed/performed Overall Cognitive Status: Within Functional Limits for tasks assessed                                       General Comments       Exercises     Shoulder Instructions      Home Living Family/patient expects to be discharged to:: Private residence Living Arrangements: Spouse/significant other Available Help at Discharge: Family Type of Home: House Home Access: Stairs to enter Secretary/administrator of Steps: 1 Entrance Stairs-Rails: None Home Layout: Two level Alternate Level Stairs-Number of Steps: 10 Alternate Level Stairs-Rails: Left Bathroom Shower/Tub: Chief Strategy Officer: Standard     Home Equipment: None          Prior  Functioning/Environment Prior Level of Function : Independent/Modified Independent;Driving                        OT Problem List:        OT Treatment/Interventions:      OT Goals(Current goals can be found in the care plan section) Acute Rehab OT Goals Patient Stated Goal: home OT Goal Formulation: With patient  OT Frequency:      Co-evaluation              AM-PAC OT "6 Clicks" Daily Activity     Outcome Measure Help from another person eating meals?: None Help from another person taking care of personal grooming?: None Help from another person toileting, which includes using toliet, bedpan, or urinal?: None Help from another person bathing (including washing, rinsing, drying)?: None Help from another person to put on and taking off regular upper body clothing?: None Help from another person to put on and taking off regular lower body clothing?: None 6 Click Score: 24   End of Session Nurse  Communication: Mobility status  Activity Tolerance: Patient tolerated treatment well Patient left: in bed;with call bell/phone within reach  OT Visit Diagnosis: Muscle weakness (generalized) (M62.81)                Time: 7829-5621 OT Time Calculation (min): 9 min Charges:  OT General Charges $OT Visit: 1 Visit OT Evaluation $OT Eval Low Complexity: 1 Low  Barry Brunner, OT Acute Rehabilitation Services Office [phone]   [name] 03/27/2023, 10:14 AM

## 2023-03-27 NOTE — Evaluation (Signed)
Physical Therapy Evaluation Patient Details Name: William Mcguire MRN: [id number] DOB: 09/24/1955 Today's Date: 03/27/2023  History of Present Illness  The pt is a 67 yo male presenting 10/3 with onset of fever, chills, and cough. Pt s/p RATS L upper lobectomy on 9/25, d/c home on 10/1. Found with UTI and CT with small to moderate hydropneumothorax. PMH includes: CKD, HTN, HLD, DM II.   Clinical Impression  Pt in bed upon arrival of PT, agreeable to evaluation at this time. Prior to admission the pt was independent with all mobility without use of DME, reports no falls. The pt presents today with no focal weakness, and is mobilizing largely at a supervision level without need for DME. He does however, demo deficits in endurance, and is SOB (with SpO2 98-100% on RA) with addition of higher intensity activities. He was able to complete stairs with use of 1 rail and short outs of speed intervals walking in hallway without LOB and with SpO2 stable, recommend continued mobility with mobility specialists and staff during stay and pt and his wife educated on progressive walking program at home. Discussed possible role of OPPT to further assist with return to normal strength and independence if pt does not see progress with mobility and endurance after return home.      If plan is discharge home, recommend the following: Help with stairs or ramp for entrance   Can travel by private vehicle        Equipment Recommendations None recommended by PT  Recommendations for Other Services       Functional Status Assessment Patient has had a recent decline in their functional status and demonstrates the ability to make significant improvements in function in a reasonable and predictable amount of time.     Precautions / Restrictions Precautions Precautions: None Restrictions Weight Bearing Restrictions: No      Mobility  Bed Mobility Overal bed mobility: Modified Independent              General bed mobility comments: slightly increased time, HOB elevated    Transfers Overall transfer level: Needs assistance Equipment used: None Transfers: Sit to/from Stand Sit to Stand: Supervision           General transfer comment: supervision for safety in session    Ambulation/Gait Ambulation/Gait assistance: Supervision Gait Distance (Feet): 350 Feet Assistive device: None Gait Pattern/deviations: WFL(Within Functional Limits) Gait velocity: decreased Gait velocity interpretation: <1.31 ft/sec, indicative of household ambulator   General Gait Details: slightly slowed, VSS. added speed intervals for intensity.  Stairs Stairs: Yes Stairs assistance: Supervision Stair Management: One rail Left, Alternating pattern Number of Stairs: 4      Balance Overall balance assessment: No apparent balance deficits (not formally assessed)                                           Pertinent Vitals/Pain Pain Assessment Pain Assessment: No/denies pain    Home Living Family/patient expects to be discharged to:: Private residence Living Arrangements: Spouse/significant other Available Help at Discharge: Family Type of Home: House Home Access: Stairs to enter Entrance Stairs-Rails: None Entrance Stairs-Number of Steps: 1 Alternate Level Stairs-Number of Steps: 10 Home Layout: Two level Home Equipment: None      Prior Function Prior Level of Function : Independent/Modified Independent;Driving             Mobility Comments: no DME,  limited endurance       Extremity/Trunk Assessment   Upper Extremity Assessment Upper Extremity Assessment: Defer to OT evaluation    Lower Extremity Assessment Lower Extremity Assessment: Overall WFL for tasks assessed    Cervical / Trunk Assessment Cervical / Trunk Assessment: Normal  Communication   Communication Communication: No apparent difficulties  Cognition Arousal: Alert Behavior During Therapy:  WFL for tasks assessed/performed Overall Cognitive Status: Within Functional Limits for tasks assessed                                          General Comments General comments (skin integrity, edema, etc.): VSS on RA. SpO2 98-100% on RA with activity    Exercises     Assessment/Plan    PT Assessment Patient needs continued PT services  PT Problem List Decreased activity tolerance;Decreased mobility       PT Treatment Interventions Therapeutic exercise;Gait training;Therapeutic activities;Patient/family education    PT Goals (Current goals can be found in the Care Plan section)  Acute Rehab PT Goals Patient Stated Goal: return to independence PT Goal Formulation: With patient Time For Goal Achievement: 04/10/23 Potential to Achieve Goals: Good    Frequency Min 1X/week        AM-PAC PT "6 Clicks" Mobility  Outcome Measure Help needed turning from your back to your side while in a flat bed without using bedrails?: None Help needed moving from lying on your back to sitting on the side of a flat bed without using bedrails?: None Help needed moving to and from a bed to a chair (including a wheelchair)?: None Help needed standing up from a chair using your arms (e.g., wheelchair or bedside chair)?: None Help needed to walk in hospital room?: A Little Help needed climbing 3-5 steps with a railing? : A Little 6 Click Score: 22    End of Session Equipment Utilized During Treatment: Gait belt Activity Tolerance: Patient tolerated treatment well Patient left: in bed;with call bell/phone within reach;with family/visitor present Nurse Communication: Mobility status PT Visit Diagnosis: Difficulty in walking, not elsewhere classified (R26.2)    Time: 2952-8413 PT Time Calculation (min) (ACUTE ONLY): 17 min   Charges:   PT Evaluation $PT Eval Low Complexity: 1 Low   PT General Charges $$ ACUTE PT VISIT: 1 Visit         Vickki Muff, PT, DPT   Acute  Rehabilitation Department Office [phone] Secure Chat Communication Preferred  Ronnie Derby 03/27/2023, 11:55 AM

## 2023-03-27 NOTE — Plan of Care (Signed)
  Problem: Pain Management: Goal: Pain level will decrease Outcome: Progressing   Problem: Pain Managment: Goal: General experience of comfort will improve Outcome: Progressing   Problem: Safety: Goal: Ability to remain free from injury will improve Outcome: Progressing

## 2023-03-27 NOTE — Plan of Care (Signed)
  Problem: Education: Goal: Knowledge of disease or condition will improve Outcome: Progressing Goal: Knowledge of the prescribed therapeutic regimen will improve Outcome: Progressing   Problem: Activity: Goal: Risk for activity intolerance will decrease Outcome: Progressing   Problem: Cardiac: Goal: Will achieve and/or maintain hemodynamic stability Outcome: Progressing   Problem: Clinical Measurements: Goal: Postoperative complications will be avoided or minimized Outcome: Progressing   Problem: Respiratory: Goal: Respiratory status will improve Outcome: Progressing   Problem: Skin Integrity: Goal: Wound healing without signs and symptoms infection will improve Outcome: Progressing   Problem: Education: Goal: Ability to describe self-care measures that may prevent or decrease complications (Diabetes Survival Skills Education) will improve Outcome: Progressing Goal: Individualized Educational Video(s) Outcome: Progressing   Problem: Coping: Goal: Ability to adjust to condition or change in health will improve Outcome: Progressing   Problem: Fluid Volume: Goal: Ability to maintain a balanced intake and output will improve Outcome: Progressing   Problem: Health Behavior/Discharge Planning: Goal: Ability to identify and utilize available resources and services will improve Outcome: Progressing Goal: Ability to manage health-related needs will improve Outcome: Progressing   Problem: Metabolic: Goal: Ability to maintain appropriate glucose levels will improve Outcome: Progressing   Problem: Nutritional: Goal: Maintenance of adequate nutrition will improve Outcome: Progressing Goal: Progress toward achieving an optimal weight will improve Outcome: Progressing   Problem: Skin Integrity: Goal: Risk for impaired skin integrity will decrease Outcome: Progressing   Problem: Tissue Perfusion: Goal: Adequacy of tissue perfusion will improve Outcome: Progressing    Problem: Education: Goal: Knowledge of General Education information will improve Description: Including pain rating scale, medication(s)/side effects and non-pharmacologic comfort measures Outcome: Progressing   Problem: Health Behavior/Discharge Planning: Goal: Ability to manage health-related needs will improve Outcome: Progressing   Problem: Clinical Measurements: Goal: Ability to maintain clinical measurements within normal limits will improve Outcome: Progressing Goal: Will remain free from infection Outcome: Progressing Goal: Diagnostic test results will improve Outcome: Progressing Goal: Respiratory complications will improve Outcome: Progressing Goal: Cardiovascular complication will be avoided Outcome: Progressing   Problem: Activity: Goal: Risk for activity intolerance will decrease Outcome: Progressing   Problem: Nutrition: Goal: Adequate nutrition will be maintained Outcome: Progressing   Problem: Coping: Goal: Level of anxiety will decrease Outcome: Progressing   Problem: Elimination: Goal: Will not experience complications related to bowel motility Outcome: Progressing Goal: Will not experience complications related to urinary retention Outcome: Progressing   Problem: Safety: Goal: Ability to remain free from injury will improve Outcome: Progressing   Problem: Skin Integrity: Goal: Risk for impaired skin integrity will decrease Outcome: Progressing   Problem: Pain Management: Goal: Pain level will decrease Outcome: Not Progressing   Problem: Pain Managment: Goal: General experience of comfort will improve Outcome: Not Progressing

## 2023-03-27 NOTE — Progress Notes (Signed)
PROGRESS NOTE  William Mcguire  DOB: [birth year]  PCP: [name], MD MVH:[id number]  DOA: 03/26/2023  LOS: 1 day  Hospital Day: 2  Brief narrative: William Mcguire is a 67 y.o. 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 10/3, patient presented to the ED with complaint of fever, chills, labored breathing and generalized weakness.  In the ED, patient spiked a temperature of 100.9, hemodynamically stable, breathing on room air Labs with WBC count 12.5, Hemoglobin 9.7, Sodium 133, BUN/Creatinine 18/1.51, lactic acid 2 Respiratory virus panel negative. Chest x-ray showed stable left apical pneumothorax, small left effusion and known anterior left upper lobe mass.  Urinalysis with cloudy amber-colored urine with large leukocytes, many bacteria Blood cultures and urine culture was sent Patient was started on IV antibiotics Admitted to [facility]  Subjective: Patient was seen and examined this morning.  Elderly African-American male.  Propped up in bed.  Not in distress.  Feels better than at presentation.  Wife at bedside. 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 Pending outpatient visit with oncology  Type 2 diabetes mellitus A1c 6.9 on 03/10/2023 PTA meds-Trulicity, Lantus 21 units daily, metormin Currently continue Lantus 21 units. Continue SSI/Accu-Cheks. I would avoid metformin going forward because of elevated  creatinine Recent Labs  Lab 03/24/23 0556 03/26/23 2107 03/26/23 2212 03/27/23 0025 03/27/23 1134  GLUCAP 88 95 106* 182* 181*   CKD 2 Creatinine remains at baseline Recent Labs    01/05/23 0917 02/02/23 1252 03/16/23 1328 03/19/23 0229 03/20/23 0319 03/21/23 0223 03/23/23 0305 03/24/23 0318 03/26/23 1716 03/27/23 0357  BUN 20 12 11 16 20 18 14  7* 18 20  CREATININE 1.50* 1.43* 1.27* 1.56* 1.61* 1.46* 1.62* 1.38* 1.51* 1.53*   Hyponatremia Sodium level slightly low.  Probably because of poor oral intake.  Encourage appetite.  Continue monitor. Recent Labs  Lab 03/21/23 0223 03/23/23 0305 03/24/23 0318 03/26/23 1716 03/27/23 0357  NA 135 133* 134* 133* 132*   Chronic anemia Baseline hemoglobin above 11.  Since the surgery, his hemoglobin has been running low. 9..  Ferritin level was low at 123.  Obtain anemia panel. Recent Labs    03/31/22 1659 05/19/22 1156 08/18/22 1518 08/19/22 1314 03/10/23 1231 03/16/23 1328 03/19/23 0229 03/20/23 0319 03/26/23 1716 03/27/23 0357  HGB 12.1*   < >  --    < >  --  10.5* 9.0* 9.0* 9.7* 8.8*  MCV 85   < >  --    < >  --  90.9 88.9 89.9 89.7 90.7  VITAMINB12  --   --  444  --  651  --   --   --   --   --   FERRITIN 61  --   --   --   --   --   --   --   --   --   TIBC 298  --   --   --   --   --   --   --   --   --  IRON 74  --   --   --   --   --   --   --   --   --    < > = values in this interval not displayed.   Weakness/generalized debility PT/OT consult placed   HTN BP controlled with amlodipine. Continue to monitor.   HLD atorvastatin reordered   Generalized weakness PT OT eval ordered   Mobility: Encourage ambulation.  PT/OT eval pending  Goals of care   Code Status: Full Code     DVT prophylaxis:  heparin injection 5,000 Units Start: 03/26/23 2200   Antimicrobials: IV Rocephin currently Fluid: None Consultants: None Family Communication: Wife at bedside  Status: Inpatient Level of  care:  Med-Surg   Patient is from: Home Needs to continue in-hospital care: Pending culture report, continue IV antibiotics Anticipated d/c to: Hopefully home in 1 to 2 days      Diet:  Diet Order             Diet heart healthy/carb modified Room service appropriate? Yes; Fluid consistency: Thin  Diet effective now                   Scheduled Meds:  amLODipine  10 mg Oral Daily   aspirin EC  81 mg Oral Daily   atorvastatin  40 mg Oral Daily   enalapril  10 mg Oral Daily   heparin  5,000 Units Subcutaneous Q8H   insulin aspart  0-15 Units Subcutaneous TID WC   insulin aspart  0-5 Units Subcutaneous QHS   insulin glargine-yfgn  21 Units Subcutaneous QHS    PRN meds: acetaminophen **OR** acetaminophen, ondansetron **OR** ondansetron (ZOFRAN) IV, oxyCODONE, senna-docusate   Infusions:   cefTRIAXone (ROCEPHIN)  IV 1 g (03/27/23 0934)    Antimicrobials: Anti-infectives (From admission, onward)    Start     Dose/Rate Route Frequency Ordered Stop   03/27/23 0800  cefTRIAXone (ROCEPHIN) 1 g in sodium chloride 0.9 % 100 mL IVPB        1 g 200 mL/hr over 30 Minutes Intravenous Every 24 hours 03/26/23 2046     03/26/23 2000  cefTRIAXone (ROCEPHIN) 2 g in sodium chloride 0.9 % 100 mL IVPB        2 g 200 mL/hr over 30 Minutes Intravenous  Once 03/26/23 1947 03/26/23 2054       Objective: Vitals:   03/27/23 0835 03/27/23 1135  BP: 122/68 127/63  Pulse: 69 75  Resp: 17 17  Temp: 98.2 F (36.8 C) 98.4 F (36.9 C)  SpO2: 100% 100%    Intake/Output Summary (Last 24 hours) at 03/27/2023 1334 Last data filed at 03/27/2023 1100 Gross per 24 hour  Intake 1090 ml  Output --  Net 1090 ml   Filed Weights   03/26/23 2025  Weight: 111.1 kg   Weight change:  Body mass index is 30.62 kg/m.   Physical Exam: General exam: Pleasant, elderly African-American male. Skin: No rashes, lesions or ulcers. HEENT: Atraumatic, normocephalic, no obvious bleeding Lungs: Clear  to auscultation bilaterally CVS: Regular rate and rhythm, no murmur GI/Abd soft, nontender, nondistended, bowel sound present CNS: Alert, awake, alert x 3 Psychiatry: Mood appropriate Extremities: No pedal edema, no calf tenderness  Data Review: I have personally reviewed the laboratory data and studies available.  F/u labs ordered Unresulted Labs (From admission, onward)     Start     Ordered   03/28/23 0500  CBC with Differential/Platelet  Daily,  R      03/27/23 1334   03/28/23 0500  Basic metabolic panel  Daily,   R      03/27/23 1334   03/27/23 1334  Vitamin B12  (Anemia Panel (PNL))  Add-on,   AD        03/27/23 1333   03/27/23 1334  Folate  (Anemia Panel (PNL))  Add-on,   AD        03/27/23 1333   03/27/23 1334  Iron and TIBC  (Anemia Panel (PNL))  Add-on,   AD        03/27/23 1333   03/27/23 1334  Ferritin  (Anemia Panel (PNL))  Add-on,   AD        03/27/23 1333   03/27/23 1334  Reticulocytes  (Anemia Panel (PNL))  Add-on,   AD        03/27/23 1333            Total time spent in review of labs and imaging, patient evaluation, formulation of plan, documentation and communication with family: 55 minutes  Signed, Lorin Glass, MD [facility] 03/27/2023

## 2023-03-28 DIAGNOSIS — N3000 Acute cystitis without hematuria: Secondary | ICD-10-CM | POA: Diagnosis not present

## 2023-03-28 LAB — CBC WITH DIFFERENTIAL/PLATELET
Abs Immature Granulocytes: 0.17 10*3/uL — ABNORMAL HIGH (ref 0.00–0.07)
Basophils Absolute: 0 10*3/uL (ref 0.0–0.1)
Basophils Relative: 0 %
Eosinophils Absolute: 0.1 10*3/uL (ref 0.0–0.5)
Eosinophils Relative: 1 %
HCT: 28.4 % — ABNORMAL LOW (ref 39.0–52.0)
Hemoglobin: 9.3 g/dL — ABNORMAL LOW (ref 13.0–17.0)
Immature Granulocytes: 2 %
Lymphocytes Relative: 17 %
Lymphs Abs: 1.6 10*3/uL (ref 0.7–4.0)
MCH: 29.4 pg (ref 26.0–34.0)
MCHC: 32.7 g/dL (ref 30.0–36.0)
MCV: 89.9 fL (ref 80.0–100.0)
Monocytes Absolute: 0.8 10*3/uL (ref 0.1–1.0)
Monocytes Relative: 9 %
Neutro Abs: 6.8 10*3/uL (ref 1.7–7.7)
Neutrophils Relative %: 71 %
Platelets: 296 10*3/uL (ref 150–400)
RBC: 3.16 MIL/uL — ABNORMAL LOW (ref 4.22–5.81)
RDW: 12.7 % (ref 11.5–15.5)
WBC: 9.6 10*3/uL (ref 4.0–10.5)
nRBC: 0 % (ref 0.0–0.2)

## 2023-03-28 LAB — BASIC METABOLIC PANEL
Anion gap: 8 (ref 5–15)
BUN: 14 mg/dL (ref 8–23)
CO2: 25 mmol/L (ref 22–32)
Calcium: 9 mg/dL (ref 8.9–10.3)
Chloride: 101 mmol/L (ref 98–111)
Creatinine, Ser: 1.32 mg/dL — ABNORMAL HIGH (ref 0.61–1.24)
GFR, Estimated: 58 mL/min — ABNORMAL LOW (ref >60)
Glucose, Bld: 141 mg/dL — ABNORMAL HIGH (ref 70–99)
Potassium: 4.5 mmol/L (ref 3.5–5.1)
Sodium: 134 mmol/L — ABNORMAL LOW (ref 135–145)

## 2023-03-28 LAB — GLUCOSE, CAPILLARY: Glucose-Capillary: 153 mg/dL — ABNORMAL HIGH (ref 70–99)

## 2023-03-28 MED ORDER — SACCHAROMYCES BOULARDII 250 MG PO CAPS: 250.0000 mg | ORAL_CAPSULE | Freq: Two times a day (BID) | ORAL | 0 refills | Status: AC

## 2023-03-28 MED ORDER — CEFDINIR 300 MG PO CAPS: 300.0000 mg | ORAL_CAPSULE | Freq: Two times a day (BID) | ORAL | 0 refills | Status: AC

## 2023-03-28 NOTE — Progress Notes (Signed)
Patient and wife both verbalized understanding of dc instructions. All belongings given to patient.

## 2023-03-28 NOTE — Discharge Summary (Signed)
Physician Discharge Summary  William Mcguire WUJ:[id number] DOB: Oct 26, 1955 DOA: 03/26/2023  PCP: [name], MD  Admit date: 03/26/2023 Discharge date: 03/28/2023  Admitted From: Home Discharge disposition: Home  Recommendations at discharge:  You been started on Omnicef for next 5 days for urinary infection.  Please watch out for any symptoms of fever, dysuria, frequency. Follow-up with oncology as an outpatient.  Brief narrative: William Mcguire is a 67 y.o. 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 10/3, patient presented to the ED with complaint of fever, chills, labored breathing and generalized weakness.  In the ED, patient spiked a temperature of 100.9, hemodynamically stable, breathing on room air Labs with WBC count 12.5, Hemoglobin 9.7, Sodium 133, BUN/Creatinine 18/1.51, lactic acid 2 Respiratory virus panel negative. Chest x-ray showed stable left apical pneumothorax, small left effusion and known anterior left upper lobe mass.  Urinalysis with cloudy amber-colored urine with large leukocytes, many bacteria Blood cultures and urine culture was sent Patient was started on IV antibiotics Admitted to [facility]  Subjective: Patient was seen and examined this morning.  Elderly African-American male.  Propped up in bed.  Not in distress.   Wife at bedside.  Both feel stable for discharge.  Hospice course: Sepsis POA Gram-negative UTI Patient with fever, chills, weakness Found to have tachypnea, leukocytosis, lactic acidosis Blood culture, urine culture sent Patient was started on IV Rocephin. Urine culture is growing gram-negative rod more than 1000 CFU per mL.  Pending sensitivity.   Clinically improving and wants to go home.  I will discharge him on 5 days of oral Omnicef.  If culture growth shows resistance to [facility], will call him later to change  antibiotics.  His wife is a retired Insurance underwriter and is aware of symptoms to watch at home. Recent Labs  Lab 03/26/23 1716 03/26/23 1725 03/26/23 2015 03/27/23 0357 03/28/23 0852  WBC 12.5*  --   --  11.8* 9.6  LATICACIDVEN  --  2.0* 1.1  --   --    Left upper lobe adenocarcinoma s/p lobectomy 03/18/2023 CT chest 10/3 with expected post surgical changes. Pending outpatient visit with oncology  Type 2 diabetes mellitus A1c 6.9 on 03/10/2023 PTA meds-Trulicity, Lantus 21 units daily, metormin Continue Trulicity and Lantus as before. I would avoid metformin going forward because of elevated creatinine Recent Labs  Lab 03/27/23 0025 03/27/23 1134 03/27/23 1601 03/27/23 2112 03/28/23 0740  GLUCAP 182* 181* 140* 231* 153*   CKD 2 Creatinine remains at baseline Recent Labs    02/02/23 1252 03/16/23 1328 03/19/23 0229 03/20/23 0319 03/21/23 0223 03/23/23 0305 03/24/23 0318 03/26/23 1716 03/27/23 0357 03/28/23 0852  BUN 12 11 16 20 18 14  7* 18 20 14   CREATININE 1.43* 1.27* 1.56* 1.61* 1.46* 1.62* 1.38* 1.51* 1.53* 1.32*   Hyponatremia Sodium level slightly low.  Probably because of poor oral intake.  Improving with appetite. Recent Labs  Lab 03/23/23 0305 03/24/23 0318 03/26/23 1716 03/27/23 0357 03/28/23 0852  NA 133* 134* 133* 132* 134*   Chronic anemia Baseline hemoglobin above 11.  Since the surgery, his hemoglobin has been running low.  Stable Recent Labs    03/31/22 1659 05/19/22 1156 08/18/22 1518 08/19/22 1314 03/10/23 1231 03/16/23 1328 03/19/23 0229 03/20/23 0319 03/26/23 1716 03/27/23 0354 03/27/23 0357 03/27/23 1352 03/27/23 1353 03/28/23 0852  HGB 12.1*   < >  --    < >  --    < >  9.0* 9.0* 9.7*  --  8.8*  --   --  9.3*  MCV 85   < >  --    < >  --    < > 88.9 89.9 89.7  --  90.7  --   --  89.9  VITAMINB12  --   --  444  --  651  --   --   --   --   --   --   --  1,195*  --   FOLATE  --   --   --   --   --   --   --   --   --  5.5*  --    --   --   --   FERRITIN 61  --   --   --   --   --   --   --   --   --   --  217  --   --   TIBC 298  --   --   --   --   --   --   --   --   --   --  235*  --   --   IRON 74  --   --   --   --   --   --   --   --   --   --  14*  --   --   RETICCTPCT  --   --   --   --   --   --   --   --   --  0.9  --   --   --   --    < > = values in this interval not displayed.   Weakness/generalized debility Seen PT/OT.  No follow-up required   HTN BP controlled with amlodipine.  Continue same   HLD atorvastatin to continue  Goals of care   Code Status: Full Code   Wounds:  -    Discharge Exam:   Vitals:   03/27/23 1605 03/27/23 2111 03/28/23 0356 03/28/23 0739  BP: 133/85 132/73 136/74 126/76  Pulse: (!) 57 72 64 63  Resp: 17 20 20 17   Temp:  98.3 F (36.8 C) 98.6 F (37 C) 98.2 F (36.8 C)  TempSrc:  Oral Oral Oral  SpO2: 100% 100% 100% 100%  Weight:      Height:        Body mass index is 30.62 kg/m.  General exam: Pleasant, elderly African-American male. Skin: No rashes, lesions or ulcers. HEENT: Atraumatic, normocephalic, no obvious bleeding Lungs: Clear to auscultation bilaterally CVS: Regular rate and rhythm, no murmur GI/Abd soft, nontender, nondistended, bowel sound present CNS: Alert, awake, alert x 3 Psychiatry: Mood appropriate Extremities: No pedal edema, no calf tenderness  Follow ups:    Follow-up Information     [name], MD Follow up.   Specialty: Internal Medicine Contact information: [address] [place] [address] [phone]                 Discharge Instructions:   Discharge Instructions     Call MD for:  difficulty breathing, headache or visual disturbances   Complete by: As directed    Call MD for:  extreme fatigue   Complete by: As directed    Call MD for:  hives   Complete by: As directed    Call MD for:  persistant dizziness or light-headedness  Complete by: As directed    Call MD for:   persistant nausea and vomiting   Complete by: As directed    Call MD for:  severe uncontrolled pain   Complete by: As directed    Call MD for:  temperature >100.4   Complete by: As directed    Diet Carb Modified   Complete by: As directed    Discharge instructions   Complete by: As directed    Recommendations at discharge:   You been started on Omnicef for next 5 days for urinary infection.  Please watch out for any symptoms of fever, dysuria, frequency.  Follow-up with oncology as an outpatient.  General discharge instructions: Follow with Primary MD [name], MD in 7 days  Please request your PCP  to go over your hospital tests, procedures, radiology results at the follow up. Please get your medicines reviewed and adjusted.  Your PCP may decide to repeat certain labs or tests as needed. Do not drive, operate heavy machinery, perform activities at heights, swimming or participation in water activities or provide baby sitting services if your were admitted for syncope or siezures until you have seen by Primary MD or a Neurologist and advised to do so again. [place] Controlled Substance Reporting System database was reviewed. Do not drive, operate heavy machinery, perform activities at heights, swim, participate in water activities or provide baby-sitting services while on medications for pain, sleep and mood until your outpatient physician has reevaluated you and advised to do so again.  You are strongly recommended to comply with the dose, frequency and duration of prescribed medications. Activity: As tolerated with Full fall precautions use walker/cane & assistance as needed Avoid using any recreational substances like cigarette, tobacco, alcohol, or non-prescribed drug. If you experience worsening of your admission symptoms, develop shortness of breath, life threatening emergency, suicidal or homicidal thoughts you must seek medical attention immediately by calling 911 or  calling your MD immediately  if symptoms less severe. You must read complete instructions/literature along with all the possible adverse reactions/side effects for all the medicines you take and that have been prescribed to you. Take any new medicine only after you have completely understood and accepted all the possible adverse reactions/side effects.  Wear Seat belts while driving. You were cared for by a hospitalist during your hospital stay. If you have any questions about your discharge medications or the care you received while you were in the hospital after you are discharged, you can call the unit and ask to speak with the hospitalist or the covering physician. Once you are discharged, your primary care physician will handle any further medical issues. Please note that NO REFILLS for any discharge medications will be authorized once you are discharged, as it is imperative that you return to your primary care physician (or establish a relationship with a primary care physician if you do not have one).   Increase activity slowly   Complete by: As directed        Discharge Medications:   Allergies as of 03/28/2023   No Known Allergies      Medication List     STOP taking these medications    metFORMIN 1000 MG tablet Commonly known as: GLUCOPHAGE       TAKE these medications    amLODipine 10 MG tablet Commonly known as: NORVASC TAKE 1 TABLET BY MOUTH EVERY DAY   aspirin EC 81 MG tablet Take 81 mg by mouth daily. Swallow whole.  atorvastatin 40 MG tablet Commonly known as: LIPITOR TAKE 1 TABLET BY MOUTH EVERY DAY   B-D INTEGRA SYRINGE 25G X 5/8" 3 ML Misc Generic drug: SYRINGE-NEEDLE (DISP) 3 ML USE AS DIRECTED   cefdinir 300 MG capsule Commonly known as: OMNICEF Take 1 capsule (300 mg total) by mouth 2 (two) times daily for 5 days.   cyanocobalamin 1000 MCG/ML injection Commonly known as: VITAMIN B12 INJECT 1 ML (1,000 MCG TOTAL) INTO THE MUSCLE EVERY 30 DAYS.    glucose blood test strip Use as instructed to test blood sugar 3 times a day. Dx code: e11.65   OneTouch Verio test strip Generic drug: glucose blood Use as instructed to check blood sugars twice daily E11.69   insulin glargine 100 UNIT/ML injection Commonly known as: LANTUS Inject 21 Units into the skin at bedtime.   oxyCODONE 5 MG immediate release tablet Commonly known as: Oxy IR/ROXICODONE Take 1 tablet (5 mg total) by mouth every 6 (six) hours as needed for moderate pain.   saccharomyces boulardii 250 MG capsule Commonly known as: FLORASTOR Take 1 capsule (250 mg total) by mouth 2 (two) times daily for 5 days.   sildenafil 100 MG tablet Commonly known as: VIAGRA TAKE 1 TABLET BY MOUTH EVERY DAY AS NEEDED (INSURANCE COVERS 10 TAB PER 77DAYS)         The results of significant diagnostics from this hospitalization (including imaging, microbiology, ancillary and laboratory) are listed below for reference.    Procedures and Diagnostic Studies:   CT Chest W Contrast  Result Date: 03/26/2023 CLINICAL DATA:  Lung/mediastinal abscess recent L lobectomy, new fevers EXAM: CT CHEST WITH CONTRAST TECHNIQUE: Multidetector CT imaging of the chest was performed during intravenous contrast administration. RADIATION DOSE REDUCTION: This exam was performed according to the departmental dose-optimization program which includes automated exposure control, adjustment of the mA and/or kV according to patient size and/or use of iterative reconstruction technique. CONTRAST:  50mL OMNIPAQUE IOHEXOL 350 MG/ML SOLN COMPARISON:  Chest x-ray today.  CT 03/12/2023 FINDINGS: Cardiovascular: Heart is normal size. Aorta is normal caliber. Mediastinum/Nodes: No mediastinal, hilar, or axillary adenopathy. Trachea and esophagus are unremarkable. Multiple nodules within the thyroid, the largest 2 cm in the anterior left thyroid towards the isthmus. This has been evaluated on previous imaging. (ref: J Am Coll  Radiol. 2015 Feb;12(2): 143-50). Lungs/Pleura: Prior left upper lobectomy. Small to moderate hydropneumothorax. Right lung clear. No effusion on the right. Upper Abdomen: No acute findings Musculoskeletal: Chest wall soft tissues are unremarkable. No acute bony abnormality. IMPRESSION: Prior left upper lobectomy. Small to moderate left hydropneumothorax. Electronically Signed   By: Charlett Nose M.D.   On: 03/26/2023 21:09   DG Chest 2 View  Result Date: 03/26/2023 CLINICAL DATA:  Shortness of breath and cough, initial encounter EXAM: CHEST - 2 VIEW COMPARISON:  03/24/2023 FINDINGS: Cardiac shadow is within normal limits. Small apical pneumothorax is again identified. Better visualized on today's exam is the known left upper lobe mass lesion. Small left-sided pleural effusion is noted. IMPRESSION: Stable left apical pneumothorax. Small left effusion and known anterior left upper lobe mass. Electronically Signed   By: Alcide Clever M.D.   On: 03/26/2023 19:16     Labs:   Basic Metabolic Panel: Recent Labs  Lab 03/23/23 0305 03/24/23 0318 03/26/23 1716 03/27/23 0357 03/28/23 0852  NA 133* 134* 133* 132* 134*  K 4.2 4.2 4.4 3.7 4.5  CL 100 103 101 97* 101  CO2 26 27 23 25  25  GLUCOSE 164* 100* 162* 169* 141*  BUN 14 7* 18 20 14   CREATININE 1.62* 1.38* 1.51* 1.53* 1.32*  CALCIUM 8.7* 8.8* 9.0 8.5* 9.0  MG  --   --   --  2.0  --    GFR Estimated Creatinine Clearance: 71 mL/min (A) (by C-G formula based on SCr of 1.32 mg/dL (H)). Liver Function Tests: Recent Labs  Lab 03/26/23 1716 03/27/23 0357  AST 58* 38  ALT 83* 65*  ALKPHOS 84 66  BILITOT 0.5 0.5  PROT 8.1 7.2  ALBUMIN 3.0* 2.5*   No results for input(s): "LIPASE", "AMYLASE" in the last 168 hours. No results for input(s): "AMMONIA" in the last 168 hours. Coagulation profile No results for input(s): "INR", "PROTIME" in the last 168 hours.  CBC: Recent Labs  Lab 03/26/23 1716 03/27/23 0357 03/28/23 0852  WBC 12.5*  11.8* 9.6  NEUTROABS 10.2* 8.9* 6.8  HGB 9.7* 8.8* 9.3*  HCT 30.5* 27.2* 28.4*  MCV 89.7 90.7 89.9  PLT 267 238 296   Cardiac Enzymes: No results for input(s): "CKTOTAL", "CKMB", "CKMBINDEX", "TROPONINI" in the last 168 hours. BNP: Invalid input(s): "POCBNP" CBG: Recent Labs  Lab 03/27/23 0025 03/27/23 1134 03/27/23 1601 03/27/23 2112 03/28/23 0740  GLUCAP 182* 181* 140* 231* 153*   D-Dimer No results for input(s): "DDIMER" in the last 72 hours. Hgb A1c No results for input(s): "HGBA1C" in the last 72 hours. Lipid Profile No results for input(s): "CHOL", "HDL", "LDLCALC", "TRIG", "CHOLHDL", "LDLDIRECT" in the last 72 hours. Thyroid function studies No results for input(s): "TSH", "T4TOTAL", "T3FREE", "THYROIDAB" in the last 72 hours.  Invalid input(s): "FREET3" Anemia work up Recent Labs    03/27/23 0354 03/27/23 1352 03/27/23 1353  VITAMINB12  --   --  1,195*  FOLATE 5.5*  --   --   FERRITIN  --  217  --   TIBC  --  235*  --   IRON  --  14*  --   RETICCTPCT 0.9  --   --    Microbiology Recent Results (from the past 240 hour(s))  Urine Culture     Status: Abnormal (Preliminary result)   Collection Time: 03/26/23  5:11 PM   Specimen: Urine, Clean Catch  Result Value Ref Range Status   Specimen Description URINE, CLEAN CATCH  Final   Special Requests NONE Reflexed from [id number]  Final   Culture (A)  Final    >=100,000 COLONIES/mL GRAM NEGATIVE RODS IDENTIFICATION AND SUSCEPTIBILITIES TO FOLLOW CULTURE REINCUBATED FOR BETTER GROWTH Performed at [facility], [address], [place], [place] [address]    Report Status PENDING  Incomplete  Resp panel by RT-PCR (RSV, Flu A&B, Covid) Anterior Nasal Swab     Status: None   Collection Time: 03/26/23  8:02 PM   Specimen: Anterior Nasal Swab  Result Value Ref Range Status   SARS Coronavirus 2 by RT PCR NEGATIVE NEGATIVE Final   Influenza A by PCR NEGATIVE NEGATIVE Final   Influenza B by PCR NEGATIVE NEGATIVE  Final    Comment: (NOTE) The Xpert Xpress SARS-CoV-2/FLU/RSV plus assay is intended as an aid in the diagnosis of influenza from Nasopharyngeal swab specimens and should not be used as a sole basis for treatment. Nasal washings and aspirates are unacceptable for Xpert Xpress SARS-CoV-2/FLU/RSV testing.  Fact Sheet for Patients: BloggerCourse.com  Fact Sheet for Healthcare Providers: SeriousBroker.it  This test is not yet approved or cleared by the [place] and has been authorized for  detection and/or diagnosis of SARS-CoV-2 by FDA under an Emergency Use Authorization (EUA). This EUA will remain in effect (meaning this test can be used) for the duration of the COVID-19 declaration under Section 564(b)(1) of the Act, 21 U.S.C. section 360bbb-3(b)(1), unless the authorization is terminated or revoked.     Resp Syncytial Virus by PCR NEGATIVE NEGATIVE Final    Comment: (NOTE) Fact Sheet for Patients: BloggerCourse.com  Fact Sheet for Healthcare Providers: SeriousBroker.it  This test is not yet approved or cleared by the [place] FDA and has been authorized for detection and/or diagnosis of SARS-CoV-2 by FDA under an Emergency Use Authorization (EUA). This EUA will remain in effect (meaning this test can be used) for the duration of the COVID-19 declaration under Section 564(b)(1) of the Act, 21 U.S.C. section 360bbb-3(b)(1), unless the authorization is terminated or revoked.  Performed at [facility], [address], [place], [place] [address]   Culture, blood (Routine X 2) w Reflex to ID Panel     Status: None (Preliminary result)   Collection Time: 03/26/23  8:49 PM   Specimen: BLOOD  Result Value Ref Range Status   Specimen Description BLOOD BLOOD RIGHT ARM  Final   Special Requests   Final    BOTTLES DRAWN AEROBIC AND ANAEROBIC Blood Culture adequate  volume   Culture   Final    NO GROWTH 2 DAYS Performed at [facility], [address], [place], [place] [address]    Report Status PENDING  Incomplete  Culture, blood (Routine X 2) w Reflex to ID Panel     Status: None (Preliminary result)   Collection Time: 03/26/23  8:49 PM   Specimen: BLOOD  Result Value Ref Range Status   Specimen Description BLOOD BLOOD RIGHT HAND  Final   Special Requests   Final    BOTTLES DRAWN AEROBIC AND ANAEROBIC Blood Culture adequate volume   Culture   Final    NO GROWTH 2 DAYS Performed at [facility], [address] [place], [place] [address]    Report Status PENDING  Incomplete    Time coordinating discharge: 45 minutes  Signed: Haillie Radu  [facility] 03/28/2023, 10:28 AM

## 2023-03-29 LAB — URINE CULTURE: Culture: >=100000 — AB

## 2023-03-29 NOTE — Progress Notes (Signed)
Urine culture resulted after patient was discharged More than 100,000 CFU per mL of E. coli sensitive to ceftriaxone.  Continue with Omnicef the patient was discharged with. Urine culture also showed 40,000 CFU per mL of Staphylococcus hemolyticus.  Insignificant growth.  Unlikely to have clinical significance.

## 2023-03-30 ENCOUNTER — Telehealth: Payer: Self-pay

## 2023-03-30 NOTE — Telephone Encounter (Signed)
Called and spoke with wife and Salik about needing to sign [employer] request for molecular studies. They will come into the office one day this week to sign the paper work. They will call the office prior to coming into the office.

## 2023-03-30 NOTE — Transitions of Care (Post Inpatient/ED Visit) (Signed)
03/30/2023  Name: William Mcguire MRN: [id number] DOB: 1955-12-15  Today's TOC FU Call Status: Today's TOC FU Call Status:: Successful TOC FU Call Completed TOC FU Call Complete Date: 03/30/23 Patient's Name and Date of Birth confirmed.  Transition Care Management Follow-up Telephone Call Date of Discharge: 03/28/23 Discharge Facility: [facility]) Type of Discharge: Inpatient Admission Primary Inpatient Discharge Diagnosis:: UTI How have you been since you were released from the hospital?: Better Any questions or concerns?: No  Items Reviewed: Did you receive and understand the discharge instructions provided?: Yes (reviewed new orders and ABT) Medications obtained,verified, and reconciled?: Partial Review Completed Reason for Partial Mediation Review: reviewed changes only including adding ABT and stopping Metformin Any new allergies since your discharge?: No Dietary orders reviewed?: Yes Type of Diet Ordered:: Reg Texture NAS, CCHO Do you have support at home?: Yes (Lives with spouse who is retired Insurance underwriter) People in Home: spouse Name of Support/Comfort Primary Source: Development worker, community  Medications Reviewed Today: Medications Reviewed Today     Reviewed by Johnnette Barrios, RN (Registered Nurse) on 03/30/23 at 1330  Med List Status: <None>   Medication Order Taking? Sig Documenting Provider Last Dose Status Informant  amLODipine (NORVASC) 10 MG tablet [id number] Yes TAKE 1 TABLET BY MOUTH EVERY DAY [name], MD Taking Active Self, Pharmacy Records  aspirin EC 81 MG tablet [id number]  Take 81 mg by mouth daily. Swallow whole. [provider]  Active Self, Pharmacy Records  atorvastatin (LIPITOR) 40 MG tablet [id number] Yes TAKE 1 TABLET BY MOUTH EVERY DAY [name], MD Taking Active Self, Pharmacy Records  cefdinir (OMNICEF) 300 MG capsule [id number] Yes Take 1 capsule (300 mg total) by mouth 2 (two) times daily for 5 days. Lorin Glass, MD Taking Active    cyanocobalamin (VITAMIN B12) 1000 MCG/ML injection [id number] Yes INJECT 1 ML (1,000 MCG TOTAL) INTO THE MUSCLE EVERY 30 DAYS. [name], MD Taking Active Self, Pharmacy Records           Med Note [facility] [place], [place] A   Thu Mar 26, 2023  9:49 PM)    glucose blood [facility] VERIO) test strip [id number] Yes Use as instructed to check blood sugars twice daily E11.69 [name], MD Taking Active Self, Pharmacy Records           Med Note Sharon Seller, [place]   Thu Mar 26, 2023  2:51 PM) States he checks 1 x day vs 2   glucose blood test strip [id number] Yes Use as instructed to test blood sugar 3 times a day. Dx code: e11.65 [name], MD Taking Active Self, Pharmacy Records  insulin glargine (LANTUS) 100 UNIT/ML injection [id number] Yes Inject 21 Units into the skin at bedtime. [provider] Taking Active Self, Pharmacy Records  oxyCODONE (OXY IR/ROXICODONE) 5 MG immediate release tablet [id number] Yes Take 1 tablet (5 mg total) by mouth every 6 (six) hours as needed for moderate pain. Jenny Reichmann, PA-C Taking Active Self, Pharmacy Records  saccharomyces boulardii [facility]) 250 MG capsule [id number] Yes Take 1 capsule (250 mg total) by mouth 2 (two) times daily for 5 days. Lorin Glass, MD Taking Active   sildenafil (VIAGRA) 100 MG tablet [id number] Yes TAKE 1 TABLET BY MOUTH EVERY DAY AS NEEDED (INSURANCE COVERS 10 TAB PER 77DAYS) [name], MD Taking Active Self, Pharmacy Records  SYRINGE-NEEDLE, DISP, 3 ML (B-D INTEGRA SYRINGE) 25G X 5/8" 3 ML MISC [id number] Yes USE AS DIRECTED [name], MD Taking Active Self, Pharmacy Records  Home Care and Equipment/Supplies: Were Home Health Services Ordered?: No Any new equipment or medical supplies ordered?: No  Functional Questionnaire: Do you need assistance with bathing/showering or dressing?: No Do you need assistance with meal preparation?: No Do you need assistance with eating?: No Do you have  difficulty maintaining continence: No  Follow up appointments reviewed: PCP Follow-up appointment confirmed?: Yes Date of PCP follow-up appointment?: 04/08/23 Follow-up Provider: Pay Moshe Salisbury, NP Specialist Hospital Follow-up appointment confirmed?: Yes Date of Specialist follow-up appointment?: 04/03/23 Follow-Up Specialty Provider:: Cardiothoracic surgery for f/u from previous admission 03/18/23 Do you need transportation to your follow-up appointment?: No Do you understand care options if your condition(s) worsen?: Yes-patient verbalized understanding    Goals Addressed             This Visit's Progress    TOC CarePlan       Current Barriers:  Chronic Disease Management support and 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   RNCM Clinical Goal(s):  Patient will work with the Care Management team over the next 30 days to address Transition of Care Barriers: Medication Management Provider appointments take all medications exactly as prescribed and will call provider for medication related questions as evidenced by no missed medications, medications are taken as prescribed( dose, frequency)  attend all scheduled medical appointments: post surgical follow-up , PCP and Oncology if indicated as evidenced by no missed appointments   through collaboration with RN Care manager, provider, and care team.   Interventions: Evaluation of current treatment plan related to  self management and patient's adherence to plan as established by provider  Transitions of Care:  New goal. Doctor Visits  - discussed the importance of doctor visits Arranged PCP follow-up within 12-14 days (Care Guide Scheduled)  Patient Goals/Self-Care Activities: Participate in Transition of Care Program/Attend [facility] scheduled calls Take all medications as prescribed Attend all scheduled provider appointments Call pharmacy for medication refills 3-7 days in advance of running out of medications work with the  Care Management team over the next 30 days to address Transition of Care Barriers: Medication Management, post procedural follow-up and symptom management, to avoid hospital readmissions   Follow Up Plan:     Next scheduled visit with TOC Management tea, 04/09/23 10:00am          Susa Loffler , BSN, RN Care Management Coordinator [facility]   [facility] [email or web address] Direct Dial: [phone]

## 2023-03-30 NOTE — Telephone Encounter (Signed)
William Mcguire came by the office to sign the [employer] form to request tissue for molecular studies.  Faxed form and insurance info to [facility] pathology at [phone], received fax confirmation.

## 2023-03-31 ENCOUNTER — Telehealth: Payer: Self-pay

## 2023-03-31 LAB — CULTURE, BLOOD (ROUTINE X 2)
Culture: NO GROWTH
Culture: NO GROWTH
Special Requests: ADEQUATE
Special Requests: ADEQUATE

## 2023-04-02 ENCOUNTER — Telehealth: Payer: Self-pay | Admitting: *Deleted

## 2023-04-02 ENCOUNTER — Other Ambulatory Visit: Payer: Medicare Other

## 2023-04-02 ENCOUNTER — Encounter: Payer: Self-pay | Admitting: Internal Medicine

## 2023-04-02 ENCOUNTER — Ambulatory Visit: Payer: Medicare Other | Admitting: Internal Medicine

## 2023-04-02 VITALS — BP 130/82 | HR 75 | Temp 98.2°F | Ht 75.0 in | Wt 242.6 lb

## 2023-04-02 DIAGNOSIS — E6609 Other obesity due to excess calories: Secondary | ICD-10-CM

## 2023-04-02 DIAGNOSIS — A4150 Gram-negative sepsis, unspecified: Secondary | ICD-10-CM | POA: Diagnosis not present

## 2023-04-02 DIAGNOSIS — Z902 Acquired absence of lung [part of]: Secondary | ICD-10-CM

## 2023-04-02 DIAGNOSIS — N182 Chronic kidney disease, stage 2 (mild): Secondary | ICD-10-CM

## 2023-04-02 DIAGNOSIS — Z6830 Body mass index (BMI) 30.0-30.9, adult: Secondary | ICD-10-CM

## 2023-04-02 DIAGNOSIS — E66811 Obesity, class 1: Secondary | ICD-10-CM

## 2023-04-02 DIAGNOSIS — C3492 Malignant neoplasm of unspecified part of left bronchus or lung: Secondary | ICD-10-CM | POA: Diagnosis not present

## 2023-04-02 DIAGNOSIS — N390 Urinary tract infection, site not specified: Secondary | ICD-10-CM

## 2023-04-02 DIAGNOSIS — I129 Hypertensive chronic kidney disease with stage 1 through stage 4 chronic kidney disease, or unspecified chronic kidney disease: Secondary | ICD-10-CM | POA: Diagnosis not present

## 2023-04-02 DIAGNOSIS — D509 Iron deficiency anemia, unspecified: Secondary | ICD-10-CM

## 2023-04-02 DIAGNOSIS — N3000 Acute cystitis without hematuria: Secondary | ICD-10-CM

## 2023-04-02 NOTE — Telephone Encounter (Signed)
Contacted patient's wife at Dr. Maxine Glenn request to offer appt for Monday 10/14  at 1240 instead of 1020 to allow more time for discussion of test results.  William Mcguire accepted time and stated that will work better for them. Appt changed

## 2023-04-02 NOTE — Telephone Encounter (Signed)
Contacted by [facility]. William Mcguire w/cb # [phone], opt 1, ext [phone] LVM - They have question about tissue specimen received. Contacted [employer] - spoke with William Mcguire: They've received tissue specimen and requisition. They need to confirm that test is for stand alone tissue specimen for [employer] Tissue Next plus PDL1. If PDL1 is not wanted, they can drop that one.  Dr. Bertis Mcguire informed of question and responded that she wanted both tests done. Contacted [employer] [facility] - spoke with William Mcguire. Informed her that Dr. Bertis Mcguire wanted both tests done. She said she will note that on order. They will contact office for further questions.

## 2023-04-02 NOTE — Progress Notes (Signed)
I,William Mcguire, CMA,acting as a Neurosurgeon for William Aliment, MD.,have documented all relevant documentation on the behalf of William Aliment, MD,as directed by  William Aliment, MD while in the presence of William Aliment, MD.  Subjective:  Patient ID: William Mcguire , male    DOB: 10/13/1955 , 67 y.o.   MRN: [id number]  No chief complaint on file.   HPI  Patient presents today for hospital follow up. He was admitted to [facility] on 03/26/2023 and discharged on 03/28/2023. He is accompanied by his wife today.   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  On October 3, he presented to the ED with a complaint of fever chills and labored breathing and generalized weakness.  In the ED, he spiked a temperature of 100.9 yet remained hemodynamically stable his labs were significant for a white count of 12.5 hemoglobin of 9.7 sodium of 133 a creatinine of 1.51, and his respiratory virus panel was negative.  Urinalysis was cloudy and amber-colored with large leukocytes and many bacteria patient was started on IV antibiotics and admitted to the hospitalist team.  Patient was found to have lactic acidosis, he was started on IV Rocephin the urine culture was positive for gram-negative rods he was discharged home on 5 days of oral Omnicef.  Patient was discharged in stable condition on October 5.  Since discharge, he has not had recurrence of any fever/chills or urinary symptoms.  He reports feeling good. Slowly but surely he states he is  getting his strength back.  His wife would like to discuss lab work- specifically his hemoglobin.      Past Medical History:  Diagnosis Date   Diabetes mellitus    High cholesterol    Hypertension      Family History  Problem Relation Age of Onset   Hypertension Mother    Multiple  myeloma Mother    Hypertension Father    Diabetes Father      Current Outpatient Medications:    amLODipine (NORVASC) 10 MG tablet, TAKE 1 TABLET BY MOUTH EVERY DAY, Disp: 90 tablet, Rfl: 1   aspirin EC 81 MG tablet, Take 81 mg by mouth daily. Swallow whole., Disp: , Rfl:    atorvastatin (LIPITOR) 40 MG tablet, TAKE 1 TABLET BY MOUTH EVERY DAY, Disp: 90 tablet, Rfl: 1   cyanocobalamin (VITAMIN B12) 1000 MCG/ML injection, INJECT 1 ML (1,000 MCG TOTAL) INTO THE MUSCLE EVERY 30 DAYS., Disp: 9 mL, Rfl: 1   glucose blood (ONETOUCH VERIO) test strip, Use as instructed to check blood sugars twice daily E11.69, Disp: 100 each, Rfl: 2   glucose blood test strip, Use as instructed to test blood sugar 3 times a day. Dx code: e11.65, Disp: 100 each, Rfl: 2   insulin glargine (LANTUS) 100 UNIT/ML injection, Inject 21 Units into the skin at bedtime., Disp: , Rfl:    oxyCODONE (OXY IR/ROXICODONE) 5 MG immediate release tablet, Take 1 tablet (5 mg total) by mouth every 6 (six) hours as needed for moderate pain., Disp: 28 tablet, Rfl: 0   sildenafil (VIAGRA) 100 MG tablet, TAKE 1 TABLET BY MOUTH EVERY DAY AS NEEDED (INSURANCE COVERS 10 TAB PER 77DAYS), Disp: 10 tablet, Rfl: 5   SYRINGE-NEEDLE, DISP, 3 ML (B-D INTEGRA SYRINGE) 25G X 5/8" 3 ML MISC, USE AS DIRECTED, Disp: 12 each,  Rfl: 24   dexamethasone (DECADRON) 4 MG tablet, Take 1 tablet twice a day the day before, the day of, and the day after chemotherapy, Disp: 40 tablet, Rfl: 2   folic acid (FOLVITE) 1 MG tablet, Take 1 tablet (1 mg total) by mouth daily., Disp: 30 tablet, Rfl: 2   prochlorperazine (COMPAZINE) 10 MG tablet, Take 1 tablet (10 mg total) by mouth every 6 (six) hours as needed., Disp: 30 tablet, Rfl: 2   No Known Allergies   Review of Systems  Constitutional: Negative.   HENT: Negative.    Respiratory:  Positive for shortness of breath.   Cardiovascular: Negative.   Skin: Negative.   Allergic/Immunologic: Negative.   Neurological:  Negative.   Hematological: Negative.      Today's Vitals   04/02/23 1409 04/02/23 1448  BP: (!) 150/82 130/82  Pulse: 75   Temp: 98.2 F (36.8 C)   SpO2: 98%   Weight: 242 lb 9.6 oz (110 kg)   Height: 6\' 3"  (1.905 m)    Body mass index is 30.32 kg/m.  Wt Readings from Last 3 Encounters:  04/13/23 244 lb 11.2 oz (111 kg)  04/06/23 241 lb 9.6 oz (109.6 kg)  04/03/23 242 lb (109.8 kg)     Objective:  Physical Exam Vitals and nursing note reviewed.  Constitutional:      Appearance: Normal appearance.  Eyes:     Extraocular Movements: Extraocular movements intact.  Cardiovascular:     Rate and Rhythm: Normal rate and regular rhythm.     Heart sounds: Normal heart sounds.  Pulmonary:     Effort: Pulmonary effort is normal.     Breath sounds: Normal breath sounds.  Musculoskeletal:     Cervical back: Normal range of motion.  Skin:    General: Skin is warm.  Neurological:     General: No focal deficit present.     Mental Status: He is alert.  Psychiatric:        Mood and Affect: Mood normal.         Assessment And Plan:  Sepsis due to gram-negative UTI [facility]) Assessment & Plan: TCM PERFORMED. A MEMBER OF THE CLINICAL TEAM SPOKE WITH THE PATIENT UPON DISCHARGE. DISCHARGE SUMMARY WAS REVIEWED IN FULL DETAIL DURING THE VISIT. MEDS RECONCILED AND COMPARED TO DISCHARGE MEDS. MEDICATION LIST WAS UPDATED AND REVIEWED WITH THE PATIENT. GREATER THAN 50% FACE TO FACE TIME WAS SPENT IN COUNSELING AND COORDINATION OF CARE. ALL QUESTIONS WERE ANSWERED TO THE SATISFACTION OF THE PATIENT. He is encouraged to complete full course of abx.     Adenocarcinoma of left lung [facility]) Assessment & Plan: He is s/p video thoracoscopy w/ left upper lobectomy. Oncology eval is pending to discuss treatment plan to include adjuvant therapy despite successful resection.    Hypertensive nephropathy Assessment & Plan: Chronic, fair control. Goal BP<120/80.  He will continue with enalapril 10mg   daily for now. Encouraged to follow low sodium diet.    Chronic renal disease, stage II Assessment & Plan: Chronic, he is encouraged to keep BP/BS well controlled and to stay well hydrated to decrease risk of CKD progression.     Iron deficiency anemia, unspecified iron deficiency anemia type Assessment & Plan: He is concerned about anemia. This is likely multifactorial.  Most recent labs reviewed.  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 He was given stool cards to complete. If any of the cards are positive, will refer him to GI for further evaluation.  Class 1 obesity due to excess calories with serious comorbidity and body mass index (BMI) of 30.0 to 30.9 in adult Assessment & Plan: He is encouraged to aim for a tleast 150 minutes of exercise per week.    S/P Robotic Assisted Video Thoracoscopy with Left Upper Lobectomy  She is encouraged to strive for BMI less than 30 to decrease cardiac risk. Advised to aim for at least 150 minutes of exercise per week.    Return in 8 weeks (on 05/28/2023), or if symptoms worsen or fail to improve.  Patient was given opportunity to ask questions. Patient verbalized understanding of the plan and was able to repeat key elements of the plan. All questions were answered to their satisfaction.    I, William Aliment, MD, have reviewed all documentation for this visit. The documentation on 04/02/23 for the exam, diagnosis, procedures, and orders are all accurate and complete.   IF YOU HAVE BEEN REFERRED TO A SPECIALIST, IT MAY TAKE 1-2 WEEKS TO SCHEDULE/PROCESS THE REFERRAL. IF YOU HAVE NOT HEARD FROM US/SPECIALIST IN TWO WEEKS, PLEASE GIVE [place] A CALL AT [phone].   THE PATIENT IS ENCOURAGED TO PRACTICE SOCIAL DISTANCING DUE TO THE COVID-19 PANDEMIC.

## 2023-04-02 NOTE — Patient Instructions (Signed)
Iron Deficiency Anemia, Adult  Iron deficiency anemia is when you do not have enough red blood cells or hemoglobin in your blood. This happens because you have too little iron in your body. Hemoglobin carries oxygen to parts of the body. Anemia can cause your body to not get enough oxygen. What are the causes? Not eating enough foods that have iron in them. The body not being able to take in iron well. Blood loss. What increases the risk? Having menstrual periods. Being pregnant. What are the signs or symptoms? Pale skin, lips, and nails. Weakness, dizziness, and getting tired easily. Feeling like you cannot breathe well when moving (shortness of breath). Cold hands and feet. Mild anemia may not cause any symptoms. How is this treated? This condition is treated by finding out why you do not have enough iron and then getting more iron. It may include: Adding foods to your diet that have a lot of iron. Taking iron pills (supplements). If you are pregnant or breastfeeding, you may need to take extra iron. Your diet often does not provide the amount of iron that you need. Getting more vitamin C in your diet. Vitamin C helps your body take in iron. You may need to take iron pills with a glass of orange juice or vitamin C pills. Medicines to make heavy menstrual periods lighter. Surgery or testing procedures to find what is causing the condition. You may need blood tests to see if treatment is working. If the treatment does not seem to be working, you may need more tests. Follow these instructions at home: Medicines Take over-the-counter and prescription medicines only as told by your doctor. This includes iron pills and vitamins. Taking them as told is important because too much iron can be harmful. Take iron pills when your stomach is empty. If you cannot handle this, take them with food. Do not drink milk or take antacids at the same time as your iron pills. Iron pills may turn your poop  (stool)black. If you cannot handle taking iron pills by mouth, ask your doctor about getting iron through: An IV tube. A shot (injection) into a muscle. Eating and drinking Talk with your doctor before changing the foods you eat. Your doctor may tell you to eat foods that have a lot of iron, such as: Liver. Low-fat (lean) beef. Breads and cereals that have iron added to them. Eggs. Dried fruit. Dark green, leafy vegetables. Eat fresh fruits and vegetables that are high in vitamin C. They help your body use iron. Foods with a lot of vitamin C include: Oranges. Peppers. Tomatoes. Mangoes. Managing constipation If you are taking iron pills, they may cause trouble pooping (constipation). To prevent or treat this, you may need to: Drink enough fluid to keep your pee (urine) pale yellow. Take over-the-counter or prescription medicines. Eat foods that are high in fiber. These include beans, whole grains, and fresh fruits and vegetables. Limit foods that are high in fat and sugar. These include fried or sweet foods. General instructions Return to your normal activities when your doctor says that it is safe. Keep all follow-up visits. Contact a doctor if: You feel like you may vomit (nauseous), or you vomit. You feel weak. You get light-headed when getting up from sitting or lying down. You are sweating for no reason. You have trouble pooping. You have worse breathing with physical activity. You have heaviness in your chest. Get help right away if: You faint. If this happens, do not drive yourself  to the hospital. You have a fast heartbeat, or a heartbeat that does not feel regular. Summary Iron deficiency anemia happens when you have too little iron in your body. This condition is treated by finding out why you do not have enough iron in your body and then getting more iron. Take over-the-counter and prescription medicines only as told by your doctor. Eat fresh fruits and vegetables  that are high in vitamin C. Contact a doctor if you have trouble pooping or feel weak. This information is not intended to replace advice given to you by your health care provider. Make sure you discuss any questions you have with your health care provider. Document Revised: 07/18/2021 Document Reviewed: 07/18/2021 Elsevier Patient Education  2024 [employer].

## 2023-04-03 ENCOUNTER — Encounter: Payer: Self-pay | Admitting: Thoracic Surgery (Cardiothoracic Vascular Surgery)

## 2023-04-03 ENCOUNTER — Ambulatory Visit (INDEPENDENT_AMBULATORY_CARE_PROVIDER_SITE_OTHER): Payer: Self-pay | Admitting: Thoracic Surgery (Cardiothoracic Vascular Surgery)

## 2023-04-03 VITALS — BP 154/81 | HR 86 | Resp 20 | Wt 242.0 lb

## 2023-04-03 DIAGNOSIS — C3492 Malignant neoplasm of unspecified part of left bronchus or lung: Secondary | ICD-10-CM

## 2023-04-03 NOTE — Progress Notes (Signed)
[address]       [place] [address]             [phone]        [facility]gu[facility] Medical Record #[id number] Date of Birth: Nov 08, 1955  Referring: Artis Delay, MD Primary Care: [name], MD Primary Cardiologist:None  Reason for visit:   follow-up  History of Present Illness:     67yo male presents for his 1st follow-up appointment.  He was readmitted with a UTI.  He is doing much better now that he is home.  Physical Exam: BP (!) 154/81 (BP Location: Left Arm, Patient Position: Sitting, Cuff Size: Normal)   Pulse 86   Resp 20   Wt 242 lb (109.8 kg)   SpO2 99% Comment: RA  BMI 30.25 kg/m   Alert NAD Incision clean.  Abdomen, ND no peripheral edema   Diagnostic Studies & Laboratory data:  Path:  FINAL MICROSCOPIC DIAGNOSIS:  A. LYMPH NODE, LEVEL 9, EXCISION:      One lymph node, negative for metastatic carcinoma (0/1).  B. LYMPH NODE, HILAR, EXCISION:      One lymph node, negative for metastatic carcinoma (0/1).  C. LYMPH NODE, LEVEL 6, EXCISION:      One lymph node, negative for metastatic carcinoma (0/1).  D. LYMPH NODE, LEVEL 6 #2, EXCISION:      One lymph node, negative for metastatic carcinoma (0/1).  E. LYMPH NODE, LEVEL 5, EXCISION:      One lymph node, negative for metastatic carcinoma (0/1).  F. LYMPH NODE, HILAR #2, EXCISION:      One lymph node, negative for metastatic carcinoma (0/1).  G. LUNG, LEFT UPPER LOBE, LOBECTOMY: Invasive mucinous adenocarcinoma with papillary features. Tumor size: 5.0 cm. Spread through air space is identified. Visceral pleural invasion is not identified. Bronchial margin and vascular margin are negative for carcinoma. Four lymph nodes, negative for metastatic carcinoma (0/4). Focus of benign subpleural organizing fibrous nodule with reactive pneumocytes (2 mm). See oncology table.  ONCOLOGY TABLE:  LUNG: Resection  Synchronous Tumors: Not applicable Total Number of  Primary Tumors: 1 Procedure: Lobectomy Specimen Laterality: Left Tumor Focality: Unifocal Tumor Site: Left upper lobe Tumor Size: 5.0 cm      Total Tumor Size: 5.0 cm      Invasive Tumor Size (applies only to invasive nonmucinous adenocarcinoma with a lepidic           component): NA Histologic Type: Invasive mucinous adenocarcinoma Visceral Pleura Invasion: Not identified Direct Invasion of Adjacent Structures: No adjacent structures present Lymphovascular Invasion: Not identified Margins: All margins negative for invasive carcinoma      Closest Margin(s) to Invasive Carcinoma: 0.8 cm from the closest bronchial margin      Margin(s) Involved by Invasive Carcinoma: Not applicable       Margin Status for Non-Invasive Tumor: Not applicable Treatment Effect: No known presurgical therapy      Percentage of Residual Viable Tumor: NA Regional Lymph Nodes:      Number of Lymph Nodes Involved: 0                           Nodal Sites with Tumor: NA      Number of Lymph Nodes Examined: 10                      Nodal Sites Examined: Lever 2, 5, 6, 9, hilar Distant Metastasis:  Distant Site(s) Involved: Not applicable Pathologic Stage Classification (pTNM, AJCC 8th Edition): pT2b, pN0     Assessment / Plan:   67yo male with T2bN0M0 adenocarcinoma of the left upper lobe.  I have referred him to medical oncology to discuss adjuvant therapy.  I will follow-up with him in 1 month with a CXR.     Corliss Skains 04/03/2023 1:21 PM

## 2023-04-06 ENCOUNTER — Ambulatory Visit: Payer: Medicare Other | Admitting: Hematology and Oncology

## 2023-04-06 ENCOUNTER — Inpatient Hospital Stay: Payer: Medicare Other | Attending: Hematology and Oncology | Admitting: Hematology and Oncology

## 2023-04-06 ENCOUNTER — Encounter: Payer: Self-pay | Admitting: Hematology and Oncology

## 2023-04-06 VITALS — BP 154/77 | HR 69 | Temp 97.6°F | Resp 18 | Ht 75.0 in | Wt 241.6 lb

## 2023-04-06 DIAGNOSIS — D649 Anemia, unspecified: Secondary | ICD-10-CM | POA: Insufficient documentation

## 2023-04-06 DIAGNOSIS — D472 Monoclonal gammopathy: Secondary | ICD-10-CM | POA: Insufficient documentation

## 2023-04-06 DIAGNOSIS — C3492 Malignant neoplasm of unspecified part of left bronchus or lung: Secondary | ICD-10-CM

## 2023-04-06 DIAGNOSIS — Z801 Family history of malignant neoplasm of trachea, bronchus and lung: Secondary | ICD-10-CM | POA: Insufficient documentation

## 2023-04-06 DIAGNOSIS — Z800 Family history of malignant neoplasm of digestive organs: Secondary | ICD-10-CM | POA: Diagnosis not present

## 2023-04-06 DIAGNOSIS — C3412 Malignant neoplasm of upper lobe, left bronchus or lung: Secondary | ICD-10-CM | POA: Insufficient documentation

## 2023-04-06 DIAGNOSIS — Z902 Acquired absence of lung [part of]: Secondary | ICD-10-CM | POA: Insufficient documentation

## 2023-04-06 NOTE — Progress Notes (Signed)
[facility] OFFICE PROGRESS NOTE  Patient Care Team: [name], MD as PCP - General (Internal Medicine)  HISTORY OF PRESENTING ILLNESS: Discussed the use of AI scribe software for clinical note transcription with the patient, who gave verbal consent to proceed.  History of Present Illness   The patient, with a recent history of lung surgery, reports experiencing pain associated with the healing process. He describes the pain as significant but manageable, and he has not required pain medication beyond the first day post-surgery. He has been performing breathing exercises as recommended by his surgeon.  The patient was diagnosed with non-small cell lung cancer, which is generally less aggressive and often effectively treated with surgery. The patient's lymph nodes were also examined and found to be clear of cancer, indicating that the disease has not spread. The surgical margins were also clear, suggesting that the entire tumor was successfully removed.  Adjuvant treatment is considered an additional layer of insurance to eliminate any potential cancer cells that may have spread but are not yet detectable.  The patient also has a history of MGUS (Monoclonal Gammopathy of Undetermined Significance), a condition that will continue to be monitored after the completion of the lung cancer treatment.  The patient has never smoked, and the cause of his lung cancer remains unknown. The patient's mother had MGUS, suggesting a possible genetic predisposition for MGUS. However, lung cancer is typically associated with environmental factors rather than inheritance.         Assessment and Plan    Non-small cell lung cancer (NSCLC) Successful surgical resection with clear margins and no lymph node involvement. Pathology report indicates T2B stage due to size. Discussed the need for adjuvant treatment due to tumor size despite successful surgery. - Sent tissue for molecular testing to  identify potential mutations that could guide treatment options. - Plan to discuss chemotherapy options with referral to see lung cancer specialist - I spoke with Dr. Arbutus Ped who agrees to see the patient and prescribe adjuvant treatment.  Post-operative recovery Patient reports pain associated with healing process. Incisions appear to be healing well. No current use of pain medication. - Continue current management and monitor for any signs of infection or complications.  MGUS Currently stable and not the primary concern. Discussed potential impact of chemotherapy on MGUS. - Plan to revisit MGUS management after completion of potential chemotherapy for NSCLC.  Follow-up plans - Consultation with Dr. Arbutus Ped to discuss adjuvant chemotherapy for NSCLC. - Continue monitoring post-operative recovery. - Re-evaluate MGUS management after potential chemotherapy completion.      Anemia -To be expected after surgery, not symptomatic, observe only    No orders of the defined types were placed in this encounter.   All questions were answered. The patient knows to call the clinic with any problems, questions or concerns. The total time spent in the appointment was 55 minutes encounter with patients including review of chart and various tests results, discussions about plan of care and coordination of care plan   Artis Delay, MD 04/06/2023 1:27 PM  REVIEW OF SYSTEMS:  All other systems were reviewed with the patient and are negative.  I have reviewed the past medical history, past surgical history, social history and family history with the patient and they are unchanged from previous note.  ALLERGIES:  has No Known Allergies.  MEDICATIONS:  Current Outpatient Medications  Medication Sig Dispense Refill   amLODipine (NORVASC) 10 MG tablet TAKE 1 TABLET BY MOUTH EVERY DAY 90 tablet  1   aspirin EC 81 MG tablet Take 81 mg by mouth daily. Swallow whole.     atorvastatin (LIPITOR) 40 MG  tablet TAKE 1 TABLET BY MOUTH EVERY DAY 90 tablet 1   cyanocobalamin (VITAMIN B12) 1000 MCG/ML injection INJECT 1 ML (1,000 MCG TOTAL) INTO THE MUSCLE EVERY 30 DAYS. 9 mL 1   glucose blood (ONETOUCH VERIO) test strip Use as instructed to check blood sugars twice daily E11.69 100 each 2   glucose blood test strip Use as instructed to test blood sugar 3 times a day. Dx code: e11.65 100 each 2   insulin glargine (LANTUS) 100 UNIT/ML injection Inject 21 Units into the skin at bedtime.     oxyCODONE (OXY IR/ROXICODONE) 5 MG immediate release tablet Take 1 tablet (5 mg total) by mouth every 6 (six) hours as needed for moderate pain. 28 tablet 0   sildenafil (VIAGRA) 100 MG tablet TAKE 1 TABLET BY MOUTH EVERY DAY AS NEEDED (INSURANCE COVERS 10 TAB PER 77DAYS) 10 tablet 5   SYRINGE-NEEDLE, DISP, 3 ML (B-D INTEGRA SYRINGE) 25G X 5/8" 3 ML MISC USE AS DIRECTED 12 each 24   No current facility-administered medications for this visit.    SUMMARY OF ONCOLOGIC HISTORY: Oncology History Overview Note  Normal cytogenetics   Adenocarcinoma of left lung (HCC)  10/14/2020 Initial Diagnosis   Adenocarcinoma of left lung (HCC)   02/17/2023 Cancer Staging   Staging form: Lung, AJCC 8th Edition - Clinical stage from 02/17/2023: Stage IIA (cT2b, cN0, cM0) - Signed by Artis Delay, MD on 02/17/2023 Stage prefix: Initial diagnosis   03/18/2023 Pathology Results   SURGICAL PATHOLOGY CASE: [id number] PATIENT: William Mcguire Surgical Pathology Report  Clinical History: lung cancer (cm)  FINAL MICROSCOPIC DIAGNOSIS:  A. LYMPH NODE, LEVEL 9, EXCISION:      One lymph node, negative for metastatic carcinoma (0/1).  B. LYMPH NODE, HILAR, EXCISION:      One lymph node, negative for metastatic carcinoma (0/1).  C. LYMPH NODE, LEVEL 6, EXCISION:      One lymph node, negative for metastatic carcinoma (0/1).  D. LYMPH NODE, LEVEL 6 #2, EXCISION:      One lymph node, negative for metastatic carcinoma (0/1).  E.  LYMPH NODE, LEVEL 5, EXCISION:      One lymph node, negative for metastatic carcinoma (0/1).  F. LYMPH NODE, HILAR #2, EXCISION:      One lymph node, negative for metastatic carcinoma (0/1).  G. LUNG, LEFT UPPER LOBE, LOBECTOMY: Invasive mucinous adenocarcinoma with papillary features. Tumor size: 5.0 cm. Spread through air space is identified. Visceral pleural invasion is not identified. Bronchial margin and vascular margin are negative for carcinoma. Four lymph nodes, negative for metastatic carcinoma (0/4). Focus of benign subpleural organizing fibrous nodule with reactive pneumocytes (2 mm). See oncology table.  ONCOLOGY TABLE:  LUNG: Resection  Synchronous Tumors: Not applicable Total Number of Primary Tumors: 1 Procedure: Lobectomy Specimen Laterality: Left Tumor Focality: Unifocal Tumor Site: Left upper lobe Tumor Size: 5.0 cm      Total Tumor Size: 5.0 cm      Invasive Tumor Size (applies only to invasive nonmucinous adenocarcinoma with a lepidic           component): NA Histologic Type: Invasive mucinous adenocarcinoma Visceral Pleura Invasion: Not identified Direct Invasion of Adjacent Structures: No adjacent structures present Lymphovascular Invasion: Not identified Margins: All margins negative for invasive carcinoma      Closest Margin(s) to Invasive Carcinoma: 0.8 cm from the closest  bronchial margin      Margin(s) Involved by Invasive Carcinoma: Not applicable       Margin Status for Non-Invasive Tumor: Not applicable Treatment Effect: No known presurgical therapy      Percentage of Residual Viable Tumor: NA Regional Lymph Nodes:      Number of Lymph Nodes Involved: 0                           Nodal Sites with Tumor: NA      Number of Lymph Nodes Examined: 10                      Nodal Sites Examined: Lever 2, 5, 6, 9, hilar Distant Metastasis:      Distant Site(s) Involved: Not applicable Pathologic Stage Classification (pTNM, AJCC 8th Edition):  pT2b, pN0 Ancillary Studies: Can be performed upon request Representative Tumor Block: G3, G4, G5 Comment(s): None ([id number])    03/18/2023 Surgery   03/18/2023   Patient:  William Mcguire Pre-Op Dx: Left upper lobe adenocarcinoma   Post-op Dx:  same Procedure: - Robotic assisted left video thoracoscopy - left upper lobectomy - Mediastinal lymph node sampling - Intercostal nerve block   Surgeon and Role:      * Lightfoot, Eliezer Lofts, MD - Primary   Assistant: B.Stehler, PA-C  An experienced assistant was required given the complexity of this surgery and the standard of surgical care. The assistant was needed for exposure, dissection, suctioning, retraction of delicate tissues and sutures, instrument exchange and for overall help during this procedure.     Anesthesia  general EBL:  200 ml Blood Administration: none Specimen:  left upper lobe, hilar and mediastinal nodes   Drains: 28 F argyle chest tube in left chest Counts: correct     Indications: 67yo male with left upper lobe 4cm pulmonary mass.  Previous biopsy has been negative.  He had a second biopsy performed at [facility] was positive for mucinous adenocarcinoma.  He was originally scheduled to have surgery at [facility], but this physician is not in network.    Of note on the PET scan there is another hypermetabolic area in the left lower lobe but this does not correspond with the specific pulmonary nodule.  I would like to obtain a repeat CT chest for further evaluation of this left lower lobe lesion, as well as an MRI brain given the size of the original tumor.  If both of these are negative then he is agreeable to proceed with a left robotic assisted thoracoscopy with left upper lobectomy.  The risks and benefits have been discussed.    Findings: Enlarged lymph nodes   Operative Technique: After the risks, benefits and alternatives were thoroughly discussed, the patient was brought to the operative theatre.  Anesthesia was  induced, and the patient was then placed in a lateral decubitus position and was prepped and draped in normal sterile fashion.  An appropriate surgical pause was performed, and pre-operative antibiotics were dosed accordingly.   We began by placing our 4 robotic ports in the the 7th intercostal space targeting the hilum of the lung.  A 12mm assistant port was placed in the 9th intercostal space in the anterior axillary line.  The robot was then docked and all instruments were passed under direct visualization.    The lung was then retracted superiorly, and the inferior pulmonary ligament was divided.  The hilum was mobilized anteriorly and posteriorly.  We identified the upper lobe pulmonary vein, and after careful isolation, it was divided with a vascular stapler.  We next moved to the pulmonary artery.  The artery was then divided with a vascular load stapler.  The bronchus to the upper lobe was then isolated.  After a test clamp, with good ventilation of the remaining lung, the bronchus was then divided.  The fissure was completed, and the specimen was passed into an endocatch bag.  It was removed from the anterior access site.     Lymph nodes were then sampled at hilum and mediastinum.  The chest was irrigated, and an air leak test was performed.  An intercostal nerve block was performed under direct visualization.  A 28 F chest tube was then placed, and we watch the remaining lobes re-expand.  The skin and soft tissue were closed with absorbable suture     The patient tolerated the procedure without any immediate complications, and was transferred to the PACU in stable condition.   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 No lytic or destructive lesions within the bones. 2. 2.9 cm nodular opacity in the hilar region on the left, unchanged from recent CT. 3. Scattered degenerative changes.   06/02/2022 Bone Marrow Biopsy   BONE MARROW, ASPIRATE, CLOT, CORE: -Variably  cellular bone marrow involved by plasma cell neoplasm (10% plasma cells by manual aspirate differential, approximately 10 to 15% by CD138 immunohistochemical stains on clot and core sections, and kappa restricted by kappa/lambda in situ hybridization.).  See comment.  PERIPHERAL BLOOD: -Normocytic normochromic anemia  COMMENT:   Morphological evaluation of the bone marrow clot sections reveals multiple lymphoid aggregates in addition to increased plasma cells.  CD3 and CD20 stains are attempted, however the lymphoid aggregates are lost on deeper levels with a single lymphoid aggregate showing slightly more CD20 staining than CD3.  While flow cytometry is negative for a clonal B-cell population, close clinical and imaging follow-up is recommended along with assessment of peripheral blood and potential sites of lymphadenopathy/organomegaly as clinically indicated.   MICROSCOPIC DESCRIPTION:  PERIPHERAL BLOOD SMEAR: Platelets: Adequate, no platelet clumps identified Erythroid: Normocytic normochromic anemia Leukocytes: Adequate, negative for dysplastic granulocytes, blasts or plasma cells.  Plasmacytoid lymphocytes present.  BONE MARROW ASPIRATE: Cellular Erythroid precursors: Erythroid precursors show a full sequence of generally orderly maturation Granulocytic precursors: Granulocytic precursors show full sequence of generally orderly maturation Megakaryocytes: Typical in number and morphology Lymphocytes/plasma cells: Plasma cells are increased  TOUCH PREPARATIONS: Confirmatory of the aspirate findings  CLOT AND BIOPSY: The bone marrow clot and biopsy reveal a variably cellular bone marrow (20 to 60%) with trilineage hematopoiesis.  Plasma cells are increased and present singly scattered and in clusters.  Also present are loosely circumscribed lymphoid aggregates with small mature lymphocytes.  The findings are confirmatory of the aspirate and touch prep impression.  SPECIAL  stains: CD138: CD138 highlights plasma cells, approximately 10% of cellularity in clot and core sections Kappa/lambda ISH: Kappa/lambda ISH shows kappa restricted plasma cells CD3: CD3 highlights T lymphocytes in the lymphoid aggregates CD20: CD20 highlights B lymphocytes in lymphoid aggregates (slightly more than CD3) CD56: CD56 is positive in the plasma cells MUM 1: MUM1 is positive in the plasma cells IRON STAIN: Iron stains are performed on a bone marrow aspirate or touch imprint smear and section of clot. The controls stained appropriately.       Storage Iron: Scant      Ring Sideroblasts:  Not identified  ADDITIONAL DATA/TESTING: Multiple myeloma panel  CELL COUNT DATA:  Bone Marrow count performed on 500 cells shows: Blasts:   0%   Myeloid:  43% Promyelocytes: 0%   Erythroid:     37% Myelocytes:    5%   Lymphocytes:   7% Metamyelocytes:     2%   Plasma cells:  10% Bands:    4% Neutrophils:   30%  M:E ratio:     1.16 Eosinophils:   2% Basophils:     0% Monocytes:     3%      PHYSICAL EXAMINATION: ECOG PERFORMANCE STATUS: 1 - Symptomatic but completely ambulatory  Vitals:   04/06/23 1248  BP: (!) 154/77  Pulse: 69  Resp: 18  Temp: 97.6 F (36.4 C)  SpO2: 100%   Filed Weights   04/06/23 1248  Weight: 241 lb 9.6 oz (109.6 kg)    GENERAL:alert, no distress and comfortable Noted well healed scars  LABORATORY DATA:  I have reviewed the data as listed    Component Value Date/Time   NA 134 (L) 03/28/2023 0852   NA 137 11/05/2022 1152   K 4.5 03/28/2023 0852   CL 101 03/28/2023 0852   CO2 25 03/28/2023 0852   GLUCOSE 141 (H) 03/28/2023 0852   BUN 14 03/28/2023 0852   BUN 11 11/05/2022 1152   CREATININE 1.32 (H) 03/28/2023 0852   CREATININE 1.14 08/19/2022 1314   CALCIUM 9.0 03/28/2023 0852   PROT 7.2 03/27/2023 0357   PROT 7.7 08/18/2022 1518   ALBUMIN 2.5 (L) 03/27/2023 0357   ALBUMIN 4.3 08/18/2022 1518   AST 38 03/27/2023 0357   AST 22 08/19/2022  1314   ALT 65 (H) 03/27/2023 0357   ALT 16 08/19/2022 1314   ALKPHOS 66 03/27/2023 0357   BILITOT 0.5 03/27/2023 0357   BILITOT 0.5 08/19/2022 1314   GFRNONAA 58 (L) 03/28/2023 0852   GFRNONAA >60 08/19/2022 1314   GFRAA 70 08/06/2020 1044    No results found for: "SPEP", "UPEP"  Lab Results  Component Value Date   WBC 9.6 03/28/2023   NEUTROABS 6.8 03/28/2023   HGB 9.3 (L) 03/28/2023   HCT 28.4 (L) 03/28/2023   MCV 89.9 03/28/2023   PLT 296 03/28/2023      Chemistry      Component Value Date/Time   NA 134 (L) 03/28/2023 0852   NA 137 11/05/2022 1152   K 4.5 03/28/2023 0852   CL 101 03/28/2023 0852   CO2 25 03/28/2023 0852   BUN 14 03/28/2023 0852   BUN 11 11/05/2022 1152   CREATININE 1.32 (H) 03/28/2023 0852   CREATININE 1.14 08/19/2022 1314      Component Value Date/Time   CALCIUM 9.0 03/28/2023 0852   ALKPHOS 66 03/27/2023 0357   AST 38 03/27/2023 0357   AST 22 08/19/2022 1314   ALT 65 (H) 03/27/2023 0357   ALT 16 08/19/2022 1314   BILITOT 0.5 03/27/2023 0357   BILITOT 0.5 08/19/2022 1314       RADIOGRAPHIC STUDIES: I have personally reviewed the radiological images as listed and agreed with the findings in the report. CT Chest W Contrast  Result Date: 03/26/2023 CLINICAL DATA:  Lung/mediastinal abscess recent L lobectomy, new fevers EXAM: CT CHEST WITH CONTRAST TECHNIQUE: Multidetector CT imaging of the chest was performed during intravenous contrast administration. RADIATION DOSE REDUCTION: This exam was performed according to the departmental dose-optimization program which includes automated exposure control, adjustment of the mA and/or kV according  to patient size and/or use of iterative reconstruction technique. CONTRAST:  50mL OMNIPAQUE IOHEXOL 350 MG/ML SOLN COMPARISON:  Chest x-ray today.  CT 03/12/2023 FINDINGS: Cardiovascular: Heart is normal size. Aorta is normal caliber. Mediastinum/Nodes: No mediastinal, hilar, or axillary adenopathy. Trachea and  esophagus are unremarkable. Multiple nodules within the thyroid, the largest 2 cm in the anterior left thyroid towards the isthmus. This has been evaluated on previous imaging. (ref: J Am Coll Radiol. 2015 Feb;12(2): 143-50). Lungs/Pleura: Prior left upper lobectomy. Small to moderate hydropneumothorax. Right lung clear. No effusion on the right. Upper Abdomen: No acute findings Musculoskeletal: Chest wall soft tissues are unremarkable. No acute bony abnormality. IMPRESSION: Prior left upper lobectomy. Small to moderate left hydropneumothorax. Electronically Signed   By: Charlett Nose M.D.   On: 03/26/2023 21:09   DG Chest 2 View  Result Date: 03/26/2023 CLINICAL DATA:  Shortness of breath and cough, initial encounter EXAM: CHEST - 2 VIEW COMPARISON:  03/24/2023 FINDINGS: Cardiac shadow is within normal limits. Small apical pneumothorax is again identified. Better visualized on today's exam is the known left upper lobe mass lesion. Small left-sided pleural effusion is noted. IMPRESSION: Stable left apical pneumothorax. Small left effusion and known anterior left upper lobe mass. Electronically Signed   By: Alcide Clever M.D.   On: 03/26/2023 19:16   DG CHEST PORT 1 VIEW  Result Date: 03/24/2023 CLINICAL DATA:  Pneumothorax follow-up. EXAM: PORTABLE CHEST 1 VIEW COMPARISON:  Chest x-ray from yesterday. FINDINGS: Interval removal of the left-sided chest tube. Small left apical pneumothorax is not significantly changed. No consolidation or pleural effusion. Stable cardiomediastinal silhouette with postsurgical changes in the left hilum. IMPRESSION: 1. Unchanged small left apical pneumothorax status post chest tube removal. Electronically Signed   By: Obie Dredge M.D.   On: 03/24/2023 11:38   DG Chest 2 View  Result Date: 03/23/2023 CLINICAL DATA:  Follow-up pneumothorax EXAM: CHEST - 2 VIEW COMPARISON:  03/23/2023, 03/22/2023, 03/20/2019 for FINDINGS: Left-sided chest tube remains in place. There is a  trace left apical pneumothorax demonstrating 6 mm pleural-parenchymal separation at the apex. Postsurgical changes in the left hilar area. Improved aeration left lung base. Stable cardiomediastinal silhouette IMPRESSION: Left chest tube remains in place with trace left apical pneumothorax better visualized. Electronically Signed   By: Jasmine Pang M.D.   On: 03/23/2023 16:33   DG Chest 1 View  Result Date: 03/23/2023 CLINICAL DATA:  Surgery follow-up. EXAM: CHEST  1 VIEW COMPARISON:  Radiograph yesterday. FINDINGS: Postsurgical change in the left hemithorax with volume loss and chain sutures at the hilum. Left-sided chest tube is in the mid lung directed towards the apex. No convincing pneumothorax. Overall low lung volumes. Ill-defined opacity in the bases, more so on the left. Stable heart size and mediastinal contours allowing for anti lordotic positioning on the current exam. IMPRESSION: 1. Postsurgical change in the left hemithorax. Left-sided chest tube in place. No convincing pneumothorax. 2. Ill-defined opacity in the bases, more so on the left, likely atelectasis. Electronically Signed   By: Narda Rutherford M.D.   On: 03/23/2023 10:37   DG Chest 1 View  Result Date: 03/22/2023 CLINICAL DATA:  Status post lobectomy. EXAM: CHEST  1 VIEW COMPARISON:  Chest radiograph dated 03/21/2023. FINDINGS: A left-sided thoracostomy tube appears unchanged. The heart size and mediastinal contours are within normal limits. Mild hazy density overlying the left lung may reflect pleural fluid or atelectasis. The right lung is clear. No significant pneumothorax on either side. Degenerative changes are  seen in the spine. IMPRESSION: Mild hazy density overlying the left lung may reflect pleural fluid or atelectasis. No significant pneumothorax. Electronically Signed   By: Romona Curls M.D.   On: 03/22/2023 11:57   DG CHEST PORT 1 VIEW  Result Date: 03/21/2023 CLINICAL DATA:  Follow-up left chest tube. EXAM: PORTABLE  CHEST 1 VIEW COMPARISON:  03/20/2023 FINDINGS: Stable position of left-sided chest tube. No visible pneumothorax. Stable cardiomediastinal contours. Lung volumes are low. No interstitial edema or airspace disease. Visualized osseous structures are unremarkable. IMPRESSION: 1. Stable position of left-sided chest tube. No visible pneumothorax. 2. Low lung volumes. Electronically Signed   By: Signa Kell M.D.   On: 03/21/2023 11:03   DG Chest 2 View  Result Date: 03/20/2023 CLINICAL DATA:  Pneumothorax EXAM: CHEST - 2 VIEW COMPARISON:  Yesterday FINDINGS: Left chest tube in place. No visible pneumothorax. Low lung volumes with patchy density attributed atelectasis. Stable heart size and mediastinal contours. Artifact from EKG leads. IMPRESSION: 1. Left chest tube with no visible pneumothorax. 2. Low lung volumes with atelectasis. Electronically Signed   By: Tiburcio Pea M.D.   On: 03/20/2023 08:42   DG CHEST PORT 1 VIEW  Result Date: 03/19/2023 CLINICAL DATA:  Pneumothorax. EXAM: PORTABLE CHEST 1 VIEW COMPARISON:  Earlier today FINDINGS: Left chest tube directed towards the apex. Small left pneumothorax is seen primarily laterally. The apical component is faintly visualized. Stable heart size and mediastinal contours. Minor bibasilar atelectasis. IMPRESSION: Small left pneumothorax with chest tube in place. Electronically Signed   By: Narda Rutherford M.D.   On: 03/19/2023 15:57   DG Chest Port 1 View  Result Date: 03/19/2023 CLINICAL DATA:  Status post lobectomy of lung. EXAM: PORTABLE CHEST 1 VIEW COMPARISON:  Radiograph yesterday FINDINGS: Postsurgical change in the left hemithorax with volume loss and chain sutures at the hilum. Left-sided chest tube remains in place. The previous small pneumothorax is not convincingly seen on the current exam. Stable heart size and mediastinal contours. Minor atelectasis at the right lung base. No large pleural effusion. IMPRESSION: Postsurgical change in the  left hemithorax with volume loss and left-sided chest tube in place. The previous small pneumothorax is not convincingly seen on the current exam. Electronically Signed   By: Narda Rutherford M.D.   On: 03/19/2023 10:04   DG Chest Port 1 View  Result Date: 03/18/2023 CLINICAL DATA:  Status post lobectomy of lung. EXAM: PORTABLE CHEST 1 VIEW COMPARISON:  Preoperative imaging. FINDINGS: Postsurgical volume loss in the left hemithorax with left chest tube in place and chain sutures at the hilum. Small pneumothorax is seen laterally. Streaky opacity at the left lung base likely represents atelectasis. The right lung is hypo aerated without focal airspace disease. No pulmonary edema. No large pleural effusion. IMPRESSION: Postsurgical volume loss in the left hemithorax with left chest tube in place. Small pneumothorax laterally. Electronically Signed   By: Narda Rutherford M.D.   On: 03/18/2023 15:36   CT Chest Wo Contrast  Result Date: 03/12/2023 CLINICAL DATA:  Lung cancer EXAM: CT CHEST WITHOUT CONTRAST TECHNIQUE: Multidetector CT imaging of the chest was performed following the standard protocol without IV contrast. RADIATION DOSE REDUCTION: This exam was performed according to the departmental dose-optimization program which includes automated exposure control, adjustment of the mA and/or kV according to patient size and/or use of iterative reconstruction technique. COMPARISON:  PET CT 12/09/2022, CT chest 11/24/2022. FINDINGS: Cardiovascular: Minimal coronary artery calcification. Global cardiac size within normal limits. No pericardial effusion.  Central pulmonary arteries are mildly enlarged suggesting changes of pulmonary arterial hypertension. The thoracic aorta is unremarkable. Mediastinum/Nodes: Multinodular goiter is better assessed on prior sonogram of 08/09/2020. No pathologic thoracic adenopathy. Esophagus unremarkable. Lungs/Pleura: Mixed density mass with both solid and ground-glass components,  compatible with a primary bronchogenic carcinoma, within the superior lingula is difficult to accurately measure given its irregular contour but appears enlarged within increasingly solid nodular component centrally, best appreciated on coronal imaging, particularly image # 54/4. The mass measures 2.5 x 2.6 x 5.2 cm in greatest dimension on the current examination (axial image # 69/8, coronal image # 55/4). Ground-glass nodule within the superior segment of the left lower lobe, axial image # 69/8, measures 8 mm, but appears increasingly dense and is suspicious for a synchronous low-grade adenocarcinoma. Tubular branching structure within the loop posterobasal left lower lobe, axial image # 80/8 is unchanged likely representing a impacted airway. 2 mm nodule within the right upper lobe, axial image # 58/8 is stable since remote prior examination of 07/26/2020 and is safely considered benign. No pneumothorax or pleural effusion. Upper Abdomen: No acute abnormality. Musculoskeletal: No acute bone abnormality. No lytic or blastic bone lesion. IMPRESSION: 1. Interval enlargement of mixed density mass within the superior lingula, compatible with a primary bronchogenic carcinoma. 2. 8 mm ground-glass nodule within the superior segment of the left lower lobe, increasingly dense and suspicious for a low-grade adenocarcinoma. 3. Minimal coronary artery calcification. 4. Mild enlargement of the central pulmonary arteries suggesting changes of pulmonary arterial hypertension. 5. Multinodular goiter better assessed on prior sonogram of 08/09/2020. Electronically Signed   By: Helyn Numbers M.D.   On: 03/12/2023 17:51

## 2023-04-08 ENCOUNTER — Encounter: Payer: Self-pay | Admitting: Hematology and Oncology

## 2023-04-08 ENCOUNTER — Inpatient Hospital Stay: Payer: Medicare Other | Admitting: Family Medicine

## 2023-04-09 ENCOUNTER — Other Ambulatory Visit: Payer: Self-pay

## 2023-04-09 ENCOUNTER — Telehealth: Payer: Self-pay

## 2023-04-09 NOTE — Patient Outreach (Signed)
Care Management  Transitions of Care Program Transitions of Care Post-discharge week 3   04/09/2023 Name: Donavon Gerhold MRN: [id number] DOB: 1955-10-07  Subjective: Kanyen Boeger is a 67 y.o. year old male who is a primary care patient of [name], MD. The Care Management team Engaged with patient Engaged with patient by telephone to assess and address transitions of care needs.   Consent to Services:  Patient was given information about care management services, agreed to services, and gave verbal consent to participate.   Assessment:     Patient voices no new complaints Patient has not developed/ reported any new Medical issues / Dx or acute changes.- since last follow-up call for most recent  Hospital stay  10/3-10/5/ 2024  He was seen by PCP 04/01/09  post hospital stay , Surgical 10/11 s/p L  lobectomy, and 10/14 Oncology - He has appt 10/21 w/ additional Oncologist for labs and consult to review Chemotherapy Treatments vs. Radiation. His incision is healing well no s/s infection. He reports so soreness and tenderness to area , but no need for pain medication His Metformin remains discontinued He checks BG daily , reports it ranges in the 120s. He continues routine Lantus. His spouse notes he remains weak, and is slowly building up his endurance. His appetite is poor. Recc some hight calorie supplements , and small frequent meals , To pace his activity and slowly increase . They had no other concerns  Patient educated on red flags s/s to watch for and was encouraged to report any of these identified , any new symptoms , changes in baseline or  medication regimen,  change in health status  /  well-being, or safety concerns to PCP and / or the  [facility] Case Management team .        SDOH Interventions    Flowsheet Row Telephone from 03/26/2023 in [place] POPULATION HEALTH DEPARTMENT Admission (Discharged) from 03/18/2023 in [place] [facility] CV PROGRESSIVE CARE Clinical Support  from 11/05/2022 in [facility] Chronic Care Management from 05[facility]02/2020 in [facility] Chronic Care Management from 01/30/2020 in [facility]  SDOH Interventions       Food Insecurity Interventions -- -- Intervention Not Indicated Intervention Not Indicated --  Housing Interventions Intervention Not Indicated Intervention Not Indicated Intervention Not Indicated Intervention Not Indicated --  Transportation Interventions Intervention Not Indicated -- Intervention Not Indicated Intervention Not Indicated --  Financial Strain Interventions -- -- Intervention Not Indicated -- Other (Comment)  [Will continue to assist with [employer] [facility] Hospital doctor patient assistance programs as needed]  Physical Activity Interventions -- -- Intervention Not Indicated -- --  Stress Interventions -- -- Intervention Not Indicated -- --  Health Literacy Interventions Intervention Not Indicated -- -- -- --        Goals Addressed             This Visit's Progress    TOC CarePlan       Current Barriers:  Chronic Disease Management support and 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 / UTI, possible start of Chemo vs Radiation   RNCM Clinical Goal(s):  Patient will work with the Care Management team over the next 30 days to address Transition of Care Barriers: Medication Management Provider appointments take all medications exactly as prescribed and will call provider for medication related questions as evidenced by no missed medications, medications are taken as prescribed( dose, frequency)  attend all scheduled  medical appointments: post surgical follow-up , PCP and Oncology if indicated as evidenced by no missed appointments   through collaboration with RN Care manager, provider, and care team.   Interventions: Evaluation of current treatment plan related to  self management and patient's adherence to  plan as established by provider  Transitions of Care:  Goal on track:  Yes. Doctor Visits  - discussed the importance of doctor visits  Patient Goals/Self-Care Activities: Participate in Transition of Care Program/Attend [facility] scheduled calls Take all medications as prescribed Attend all scheduled provider appointments Call pharmacy for medication refills 3-7 days in advance of running out of medications Perform all self care activities independently  work with the Care Management team over the next 30 days to address Transition of Care Barriers: Medication Management, post procedural follow-up and symptom management, to avoid hospital readmissions   Follow Up Plan:  Next scheduled visit with TOC Management tea, 04/16/23 10:30am        Routine follow-up and on-going assessment evaluation and education of disease processes, recommended interventions for both chronic and acute medical conditions , will occur during each weekly visit along with ongoing review of symptoms ,medication reviews and reconciliation. Any updates , inconsistencies, discrepancies or acute care concerns will be addressed and routed to the correct Practitioner if indicated   Please refer to Care Plan for goals and interventions -Effectiveness of interventions, symptom management and outcomes will be evaluated in weekly during [facility] 30-day Program Outreach calls  . Any necessary  changes and updates to Care Plan will be completed episodically  Reviewed goals for care  Patient provided with Contact information and verbalized understanding with current POC.    Plan: The patient has been provided with contact information for the care management team and has been advised to call with any health related questions or concerns.   Susa Loffler , BSN, RN Care Management Coordinator [place]   [facility] [email or web address] Direct Dial: [phone]

## 2023-04-10 ENCOUNTER — Encounter (HOSPITAL_COMMUNITY): Payer: Self-pay

## 2023-04-10 ENCOUNTER — Other Ambulatory Visit: Payer: Self-pay

## 2023-04-10 DIAGNOSIS — C3492 Malignant neoplasm of unspecified part of left bronchus or lung: Secondary | ICD-10-CM

## 2023-04-10 NOTE — Progress Notes (Signed)
I called the pt to see if he would be available to come to an appt at 1pm for labs and 1:30pm on 10/21 for a medical oncology consult with Dr.mohamed. Pt tells me he will be available. [place] message sent to scheduler.

## 2023-04-13 ENCOUNTER — Other Ambulatory Visit: Payer: Self-pay | Admitting: Physician Assistant

## 2023-04-13 ENCOUNTER — Inpatient Hospital Stay (HOSPITAL_BASED_OUTPATIENT_CLINIC_OR_DEPARTMENT_OTHER): Payer: Medicare Other | Admitting: Physician Assistant

## 2023-04-13 ENCOUNTER — Encounter: Payer: Self-pay | Admitting: Internal Medicine

## 2023-04-13 ENCOUNTER — Other Ambulatory Visit: Payer: Self-pay

## 2023-04-13 ENCOUNTER — Inpatient Hospital Stay: Payer: Medicare Other

## 2023-04-13 VITALS — BP 137/77 | HR 84 | Temp 99.9°F | Resp 19 | Ht 75.0 in | Wt 244.7 lb

## 2023-04-13 DIAGNOSIS — D472 Monoclonal gammopathy: Secondary | ICD-10-CM | POA: Diagnosis not present

## 2023-04-13 DIAGNOSIS — Z807 Family history of other malignant neoplasms of lymphoid, hematopoietic and related tissues: Secondary | ICD-10-CM | POA: Diagnosis not present

## 2023-04-13 DIAGNOSIS — C3432 Malignant neoplasm of lower lobe, left bronchus or lung: Secondary | ICD-10-CM | POA: Diagnosis not present

## 2023-04-13 DIAGNOSIS — Z801 Family history of malignant neoplasm of trachea, bronchus and lung: Secondary | ICD-10-CM

## 2023-04-13 DIAGNOSIS — Z800 Family history of malignant neoplasm of digestive organs: Secondary | ICD-10-CM

## 2023-04-13 DIAGNOSIS — Z7189 Other specified counseling: Secondary | ICD-10-CM

## 2023-04-13 DIAGNOSIS — C3492 Malignant neoplasm of unspecified part of left bronchus or lung: Secondary | ICD-10-CM

## 2023-04-13 DIAGNOSIS — D649 Anemia, unspecified: Secondary | ICD-10-CM | POA: Diagnosis not present

## 2023-04-13 DIAGNOSIS — Z902 Acquired absence of lung [part of]: Secondary | ICD-10-CM | POA: Diagnosis not present

## 2023-04-13 DIAGNOSIS — C3412 Malignant neoplasm of upper lobe, left bronchus or lung: Secondary | ICD-10-CM | POA: Diagnosis not present

## 2023-04-13 LAB — CBC WITH DIFFERENTIAL (CANCER CENTER ONLY)
Abs Immature Granulocytes: 0.05 10*3/uL (ref 0.00–0.07)
Basophils Absolute: 0 10*3/uL (ref 0.0–0.1)
Basophils Relative: 0 %
Eosinophils Absolute: 0.1 10*3/uL (ref 0.0–0.5)
Eosinophils Relative: 1 %
HCT: 28.6 % — ABNORMAL LOW (ref 39.0–52.0)
Hemoglobin: 9.3 g/dL — ABNORMAL LOW (ref 13.0–17.0)
Immature Granulocytes: 1 %
Lymphocytes Relative: 21 %
Lymphs Abs: 1.3 10*3/uL (ref 0.7–4.0)
MCH: 28.8 pg (ref 26.0–34.0)
MCHC: 32.5 g/dL (ref 30.0–36.0)
MCV: 88.5 fL (ref 80.0–100.0)
Monocytes Absolute: 0.8 10*3/uL (ref 0.1–1.0)
Monocytes Relative: 13 %
Neutro Abs: 3.7 10*3/uL (ref 1.7–7.7)
Neutrophils Relative %: 64 %
Platelet Count: 199 10*3/uL (ref 150–400)
RBC: 3.23 MIL/uL — ABNORMAL LOW (ref 4.22–5.81)
RDW: 12.7 % (ref 11.5–15.5)
WBC Count: 5.9 10*3/uL (ref 4.0–10.5)
nRBC: 0 % (ref 0.0–0.2)

## 2023-04-13 LAB — CMP (CANCER CENTER ONLY)
ALT: 15 U/L (ref 0–44)
AST: 20 U/L (ref 15–41)
Albumin: 3.4 g/dL — ABNORMAL LOW (ref 3.5–5.0)
Alkaline Phosphatase: 117 U/L (ref 38–126)
Anion gap: 4 — ABNORMAL LOW (ref 5–15)
BUN: 11 mg/dL (ref 8–23)
CO2: 27 mmol/L (ref 22–32)
Calcium: 9.3 mg/dL (ref 8.9–10.3)
Chloride: 103 mmol/L (ref 98–111)
Creatinine: 1.32 mg/dL — ABNORMAL HIGH (ref 0.61–1.24)
GFR, Estimated: 58 mL/min — ABNORMAL LOW (ref >60)
Glucose, Bld: 182 mg/dL — ABNORMAL HIGH (ref 70–99)
Potassium: 4 mmol/L (ref 3.5–5.1)
Sodium: 134 mmol/L — ABNORMAL LOW (ref 135–145)
Total Bilirubin: 0.5 mg/dL (ref 0.3–1.2)
Total Protein: 8.6 g/dL — ABNORMAL HIGH (ref 6.5–8.1)

## 2023-04-13 MED ORDER — FOLIC ACID 1 MG PO TABS
1.0000 mg | ORAL_TABLET | Freq: Every day | ORAL | 2 refills | Status: DC
Start: 2023-04-13 — End: 2023-07-29

## 2023-04-13 MED ORDER — DEXAMETHASONE 4 MG PO TABS
ORAL_TABLET | ORAL | 2 refills | Status: DC
Start: 2023-04-13 — End: 2023-10-05

## 2023-04-13 MED ORDER — PROCHLORPERAZINE MALEATE 10 MG PO TABS
10.0000 mg | ORAL_TABLET | Freq: Four times a day (QID) | ORAL | 2 refills | Status: DC | PRN
Start: 2023-04-13 — End: 2023-05-06

## 2023-04-13 NOTE — Patient Instructions (Addendum)
Summary:  -There are two main categories of lung cancer, they are named based on the size of the cancer cell. One is called Non-Small cell lung cancer. 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 This is the most common type of lung cancer.  -Due to the size, that puts you at stage IIA.  -For never smoker, we want to test you for genetic markers. We will request this test to be performed on the tumor Dr. Cliffton Asters removed.  -We covered a lot of important information at your appointment today regarding what the treatment plan is moving forward. Here are the the main points that were discussed at your office visit with [place] today:  -If you have a genetic marker for cancer, some can be used now for treatment after chemotherapy, such as EGFR. If you have any other mutations, we would not use now but it is important to know in the future.   -The treatment that you will receive consists of two chemotherapy drugs, called Carboplatin and Alimta (also called Pemetrexed).  -We are planning on starting your treatment next week on ~04/29/23 but before your start your treatment, I would like you to attend a Chemotherapy Education Class. This involves having you sit down with one of our nurse educators. She will discuss with your one-on-one more details about your treatment as well as general information about resources here at the cancer center.  -Your treatment will be given once every 3 weeks. -We will get a CT scan after 3 treatments to check on the progress of treatment   Medications:  -I have sent a few important medication prescriptions to your pharmacy.  -Compazine was sent to your pharmacy. This medication is for nausea. You may take this every 6 hours as needed if you feel nauseous.  -I have also sent a prescription for 1 mg of folic acid to your pharmacy. We need you to take 1  tablet every day.  -Please make sure you continue to take your B12 injection monthly -He will take decadron twice a day the day before treatment, the day of, and the day after treatment.   Referrals or Imaging:   Follow up:  -We will see you back for a follow up visit 1 week after your first treatment to see how it went and help manage any side effects of treatment that you may have   -If you need to reach [place] at any time, the main office number to the cancer center is [phone], when you call, ask to speak to either Cassie's or Dr. Asa Lente nurse.

## 2023-04-13 NOTE — Progress Notes (Signed)
[facility] [phone]   [phone]  CONSULT NOTE  REFERRING PHYSICIAN: Dr. Cliffton Asters   REASON FOR CONSULTATION:  Adenocarcinoma   HPI William Mcguire is a 67 y.o. male with a past medical history significant for HTN, diabetes, MGUS, CKD, and HLD that is referred to the clinic for adenocarcinoma of the lung.   The patient had been following with Dr. Bertis Ruddy for MGUS since November 2023.   He had been worked up at [facility] for [employer]. He had a PET scan on 12/09/22 showing mixed density nodule in the left upper lobe/lingula demonstrating focal hypermetabolism, suggesting that at least a portion of this lesion reflects primary bronchogenic carcinoma, likely adenocarcinoma. There was an additional focal hypermetabolism in the superior segment left lower lobe without corresponding nodule. He had a repeat CT scan on 03/12/23 to re-evaluate this which showed interval enlargement of mixed density mass within the superior lingula, compatible with a primary bronchogenic carcinoma. There was 8 mm ground-glass nodule within the superior segment of the left lower lobe, increasingly dense and suspicious for a low-grade adenocarcinoma.  Guardant 360 testing showed PDL1 expression of 6%. I do not see additional molecular studies pending.   His staging brain MRI was negative for metastatic disease to the brain.   He underwent surgery on 03/18/23 by Dr. Cliffton Asters. 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 4 lymph nodes were negative for metastatic carcinoma. This was staged at T2b, N0, M0.   He was referred to the clinic to discuss adjuvant treatment options.   Overall, he feels ok today. His temperature was 99.9 today but he denies any signs of infection or any known fevers. His 95 year old grandchild was over last week and had a sore throat. He denies significant shortness of breath. He had some  non-productive cough since surgery but denies any changes in the cough. He denies nausea, vomiting, diarrhea, or constipation. He had a slight headache last night that resolved without intervention.   He has been anemic.  He had stool cards performed at his PCPs office last week but they did not get any results.  He started taking iron a few weeks ago and he is compliant with this.  He also receives B12 injections through his PCPs office monthly.  He denies any visible bleeding to his knowledge.  His family history consist of a mother who had multiple myeloma.  He also has diabetes on his mother side of the family.  He also had an uncle with colorectal cancer and another uncle with lung cancer.  His siblings have hypertension and diabetes.  The patient is retired and used to work at [employer].  He is married.  He has 2 children.  He is a never smoker.  Denies any alcohol or drug use.  HPI  Past Medical History:  Diagnosis Date   Diabetes mellitus    High cholesterol    Hypertension     Past Surgical History:  Procedure Laterality Date   BRONCHIAL BIOPSY  01/05/2023   Procedure: BRONCHIAL BIOPSIES;  Surgeon: Josephine Igo, DO;  Location: [facility];  Service: Pulmonary;;   BRONCHIAL BRUSHINGS  01/05/2023   Procedure: BRONCHIAL BRUSHINGS;  Surgeon: Josephine Igo, DO;  Location: [facility];  Service: Pulmonary;;   BRONCHIAL NEEDLE ASPIRATION BIOPSY  01/05/2023   Procedure: BRONCHIAL NEEDLE ASPIRATION BIOPSIES;  Surgeon: Josephine Igo, DO;  Location: [facility];  Service: Pulmonary;;   INTERCOSTAL NERVE  BLOCK Left 03/18/2023   Procedure: INTERCOSTAL NERVE BLOCK;  Surgeon: Corliss Skains, MD;  Location: [facility] OR;  Service: Thoracic;  Laterality: Left;   KNEE SURGERY Left 2004   torn cartilage   LYMPH NODE BIOPSY Left 03/18/2023   Procedure: LYMPH NODE BIOPSY;  Surgeon: Corliss Skains, MD;  Location: [facility] OR;  Service: Thoracic;  Laterality: Left;   PATELLAR TENDON REPAIR   06/11/2011   Procedure: PATELLA TENDON REPAIR;  Surgeon: Cammy Copa;  Location: [facility];  Service: Orthopedics;  Laterality: Right;    Family History  Problem Relation Age of Onset   Hypertension Mother    Multiple myeloma Mother    Hypertension Father    Diabetes Father     Social History Social History   Tobacco Use   Smoking status: Never   Smokeless tobacco: Never   Tobacco comments:    n/a  Vaping Use   Vaping status: Never Used  Substance Use Topics   Alcohol use: No   Drug use: No    No Known Allergies  Current Outpatient Medications  Medication Sig Dispense Refill   dexamethasone (DECADRON) 4 MG tablet Take 1 tablet twice a day the day before, the day of, and the day after chemotherapy 40 tablet 2   folic acid (FOLVITE) 1 MG tablet Take 1 tablet (1 mg total) by mouth daily. 30 tablet 2   prochlorperazine (COMPAZINE) 10 MG tablet Take 1 tablet (10 mg total) by mouth every 6 (six) hours as needed. 30 tablet 2   amLODipine (NORVASC) 10 MG tablet TAKE 1 TABLET BY MOUTH EVERY DAY 90 tablet 1   aspirin EC 81 MG tablet Take 81 mg by mouth daily. Swallow whole.     atorvastatin (LIPITOR) 40 MG tablet TAKE 1 TABLET BY MOUTH EVERY DAY 90 tablet 1   cyanocobalamin (VITAMIN B12) 1000 MCG/ML injection INJECT 1 ML (1,000 MCG TOTAL) INTO THE MUSCLE EVERY 30 DAYS. 9 mL 1   glucose blood (ONETOUCH VERIO) test strip Use as instructed to check blood sugars twice daily E11.69 100 each 2   glucose blood test strip Use as instructed to test blood sugar 3 times a day. Dx code: e11.65 100 each 2   insulin glargine (LANTUS) 100 UNIT/ML injection Inject 21 Units into the skin at bedtime.     oxyCODONE (OXY IR/ROXICODONE) 5 MG immediate release tablet Take 1 tablet (5 mg total) by mouth every 6 (six) hours as needed for moderate pain. 28 tablet 0   sildenafil (VIAGRA) 100 MG tablet TAKE 1 TABLET BY MOUTH EVERY DAY AS NEEDED (INSURANCE COVERS 10 TAB PER 77DAYS) 10 tablet 5    SYRINGE-NEEDLE, DISP, 3 ML (B-D INTEGRA SYRINGE) 25G X 5/8" 3 ML MISC USE AS DIRECTED 12 each 24   No current facility-administered medications for this visit.    REVIEW OF SYSTEMS:   Review of Systems  Constitutional: Negative for appetite change, chills, fatigue, fever and unexpected weight change.  HENT: Negative for mouth sores, nosebleeds, sore throat and trouble swallowing.   Eyes: Negative for eye problems and icterus.  Respiratory: Negative for cough, hemoptysis, shortness of breath and wheezing.   Cardiovascular: Positive for some soreness in chest over the surgical site. Negative for leg swelling.  Gastrointestinal: Negative for abdominal pain, constipation, diarrhea, nausea and vomiting.  Genitourinary: Negative for bladder incontinence, difficulty urinating, dysuria, frequency and hematuria.   Musculoskeletal: Negative for back pain, gait problem, neck pain and neck stiffness.  Skin: Negative for itching  and rash. Dry skin on lower extremities.  Neurological: Negative for dizziness, extremity weakness, gait problem, headaches, light-headedness and seizures.  Hematological: Negative for adenopathy. Does not bruise/bleed easily.  Psychiatric/Behavioral: Negative for confusion, depression and sleep disturbance. The patient is not nervous/anxious.     PHYSICAL EXAMINATION:  Blood pressure 137/77, pulse 84, temperature 99.9 F (37.7 C), temperature source Oral, resp. rate 19, height 6\' 3"  (1.905 m), weight 244 lb 11.2 oz (111 kg), SpO2 100%.  ECOG PERFORMANCE STATUS: 1  Physical Exam  Constitutional: Oriented to person, place, and time and well-developed, well-nourished, and in no distress.  HENT:  Head: Normocephalic and atraumatic.  Mouth/Throat: Oropharynx is clear and moist. No oropharyngeal exudate.  Eyes: Conjunctivae are normal. Right eye exhibits no discharge. Left eye exhibits no discharge. No scleral icterus.  Neck: Normal range of motion. Neck supple.   Cardiovascular: Normal rate, regular rhythm, normal heart sounds and intact distal pulses.   Pulmonary/Chest: Effort normal and breath sounds normal. No respiratory distress. No wheezes. No rales.  Abdominal: Soft. Bowel sounds are normal. Exhibits no distension and no mass. There is no tenderness.  Musculoskeletal: Normal range of motion. Exhibits no edema. Positive for dry flaking skin on lower extremities bilaterally.  Lymphadenopathy:    No cervical adenopathy.  Neurological: Alert and oriented to person, place, and time. Exhibits normal muscle tone. Gait normal. Coordination normal.  Skin: Skin is warm and dry. No rash noted. Not diaphoretic. No erythema. No pallor.  Psychiatric: Mood, memory and judgment normal.  Vitals reviewed.  LABORATORY DATA: Lab Results  Component Value Date   WBC 5.9 04/13/2023   HGB 9.3 (L) 04/13/2023   HCT 28.6 (L) 04/13/2023   MCV 88.5 04/13/2023   PLT 199 04/13/2023      Chemistry      Component Value Date/Time   NA 134 (L) 04/13/2023 1313   NA 137 11/05/2022 1152   K 4.0 04/13/2023 1313   CL 103 04/13/2023 1313   CO2 27 04/13/2023 1313   BUN 11 04/13/2023 1313   BUN 11 11/05/2022 1152   CREATININE 1.32 (H) 04/13/2023 1313      Component Value Date/Time   CALCIUM 9.3 04/13/2023 1313   ALKPHOS 117 04/13/2023 1313   AST 20 04/13/2023 1313   ALT 15 04/13/2023 1313   BILITOT 0.5 04/13/2023 1313       RADIOGRAPHIC STUDIES: CT Chest W Contrast  Result Date: 03/26/2023 CLINICAL DATA:  Lung/mediastinal abscess recent L lobectomy, new fevers EXAM: CT CHEST WITH CONTRAST TECHNIQUE: Multidetector CT imaging of the chest was performed during intravenous contrast administration. RADIATION DOSE REDUCTION: This exam was performed according to the departmental dose-optimization program which includes automated exposure control, adjustment of the mA and/or kV according to patient size and/or use of iterative reconstruction technique. CONTRAST:  50mL  OMNIPAQUE IOHEXOL 350 MG/ML SOLN COMPARISON:  Chest x-ray today.  CT 03/12/2023 FINDINGS: Cardiovascular: Heart is normal size. Aorta is normal caliber. Mediastinum/Nodes: No mediastinal, hilar, or axillary adenopathy. Trachea and esophagus are unremarkable. Multiple nodules within the thyroid, the largest 2 cm in the anterior left thyroid towards the isthmus. This has been evaluated on previous imaging. (ref: J Am Coll Radiol. 2015 Feb;12(2): 143-50). Lungs/Pleura: Prior left upper lobectomy. Small to moderate hydropneumothorax. Right lung clear. No effusion on the right. Upper Abdomen: No acute findings Musculoskeletal: Chest wall soft tissues are unremarkable. No acute bony abnormality. IMPRESSION: Prior left upper lobectomy. Small to moderate left hydropneumothorax. Electronically Signed   By: Caryn Bee  Dover M.D.   On: 03/26/2023 21:09   DG Chest 2 View  Result Date: 03/26/2023 CLINICAL DATA:  Shortness of breath and cough, initial encounter EXAM: CHEST - 2 VIEW COMPARISON:  03/24/2023 FINDINGS: Cardiac shadow is within normal limits. Small apical pneumothorax is again identified. Better visualized on today's exam is the known left upper lobe mass lesion. Small left-sided pleural effusion is noted. IMPRESSION: Stable left apical pneumothorax. Small left effusion and known anterior left upper lobe mass. Electronically Signed   By: Alcide Clever M.D.   On: 03/26/2023 19:16   DG CHEST PORT 1 VIEW  Result Date: 03/24/2023 CLINICAL DATA:  Pneumothorax follow-up. EXAM: PORTABLE CHEST 1 VIEW COMPARISON:  Chest x-ray from yesterday. FINDINGS: Interval removal of the left-sided chest tube. Small left apical pneumothorax is not significantly changed. No consolidation or pleural effusion. Stable cardiomediastinal silhouette with postsurgical changes in the left hilum. IMPRESSION: 1. Unchanged small left apical pneumothorax status post chest tube removal. Electronically Signed   By: Obie Dredge M.D.   On:  03/24/2023 11:38   DG Chest 2 View  Result Date: 03/23/2023 CLINICAL DATA:  Follow-up pneumothorax EXAM: CHEST - 2 VIEW COMPARISON:  03/23/2023, 03/22/2023, 03/20/2019 for FINDINGS: Left-sided chest tube remains in place. There is a trace left apical pneumothorax demonstrating 6 mm pleural-parenchymal separation at the apex. Postsurgical changes in the left hilar area. Improved aeration left lung base. Stable cardiomediastinal silhouette IMPRESSION: Left chest tube remains in place with trace left apical pneumothorax better visualized. Electronically Signed   By: Jasmine Pang M.D.   On: 03/23/2023 16:33   DG Chest 1 View  Result Date: 03/23/2023 CLINICAL DATA:  Surgery follow-up. EXAM: CHEST  1 VIEW COMPARISON:  Radiograph yesterday. FINDINGS: Postsurgical change in the left hemithorax with volume loss and chain sutures at the hilum. Left-sided chest tube is in the mid lung directed towards the apex. No convincing pneumothorax. Overall low lung volumes. Ill-defined opacity in the bases, more so on the left. Stable heart size and mediastinal contours allowing for anti lordotic positioning on the current exam. IMPRESSION: 1. Postsurgical change in the left hemithorax. Left-sided chest tube in place. No convincing pneumothorax. 2. Ill-defined opacity in the bases, more so on the left, likely atelectasis. Electronically Signed   By: Narda Rutherford M.D.   On: 03/23/2023 10:37   DG Chest 1 View  Result Date: 03/22/2023 CLINICAL DATA:  Status post lobectomy. EXAM: CHEST  1 VIEW COMPARISON:  Chest radiograph dated 03/21/2023. FINDINGS: A left-sided thoracostomy tube appears unchanged. The heart size and mediastinal contours are within normal limits. Mild hazy density overlying the left lung may reflect pleural fluid or atelectasis. The right lung is clear. No significant pneumothorax on either side. Degenerative changes are seen in the spine. IMPRESSION: Mild hazy density overlying the left lung may reflect  pleural fluid or atelectasis. No significant pneumothorax. Electronically Signed   By: Romona Curls M.D.   On: 03/22/2023 11:57   DG CHEST PORT 1 VIEW  Result Date: 03/21/2023 CLINICAL DATA:  Follow-up left chest tube. EXAM: PORTABLE CHEST 1 VIEW COMPARISON:  03/20/2023 FINDINGS: Stable position of left-sided chest tube. No visible pneumothorax. Stable cardiomediastinal contours. Lung volumes are low. No interstitial edema or airspace disease. Visualized osseous structures are unremarkable. IMPRESSION: 1. Stable position of left-sided chest tube. No visible pneumothorax. 2. Low lung volumes. Electronically Signed   By: Signa Kell M.D.   On: 03/21/2023 11:03   DG Chest 2 View  Result Date: 03/20/2023 CLINICAL  DATA:  Pneumothorax EXAM: CHEST - 2 VIEW COMPARISON:  Yesterday FINDINGS: Left chest tube in place. No visible pneumothorax. Low lung volumes with patchy density attributed atelectasis. Stable heart size and mediastinal contours. Artifact from EKG leads. IMPRESSION: 1. Left chest tube with no visible pneumothorax. 2. Low lung volumes with atelectasis. Electronically Signed   By: Tiburcio Pea M.D.   On: 03/20/2023 08:42   DG CHEST PORT 1 VIEW  Result Date: 03/19/2023 CLINICAL DATA:  Pneumothorax. EXAM: PORTABLE CHEST 1 VIEW COMPARISON:  Earlier today FINDINGS: Left chest tube directed towards the apex. Small left pneumothorax is seen primarily laterally. The apical component is faintly visualized. Stable heart size and mediastinal contours. Minor bibasilar atelectasis. IMPRESSION: Small left pneumothorax with chest tube in place. Electronically Signed   By: Narda Rutherford M.D.   On: 03/19/2023 15:57   DG Chest Port 1 View  Result Date: 03/19/2023 CLINICAL DATA:  Status post lobectomy of lung. EXAM: PORTABLE CHEST 1 VIEW COMPARISON:  Radiograph yesterday FINDINGS: Postsurgical change in the left hemithorax with volume loss and chain sutures at the hilum. Left-sided chest tube remains in  place. The previous small pneumothorax is not convincingly seen on the current exam. Stable heart size and mediastinal contours. Minor atelectasis at the right lung base. No large pleural effusion. IMPRESSION: Postsurgical change in the left hemithorax with volume loss and left-sided chest tube in place. The previous small pneumothorax is not convincingly seen on the current exam. Electronically Signed   By: Narda Rutherford M.D.   On: 03/19/2023 10:04   DG Chest Port 1 View  Result Date: 03/18/2023 CLINICAL DATA:  Status post lobectomy of lung. EXAM: PORTABLE CHEST 1 VIEW COMPARISON:  Preoperative imaging. FINDINGS: Postsurgical volume loss in the left hemithorax with left chest tube in place and chain sutures at the hilum. Small pneumothorax is seen laterally. Streaky opacity at the left lung base likely represents atelectasis. The right lung is hypo aerated without focal airspace disease. No pulmonary edema. No large pleural effusion. IMPRESSION: Postsurgical volume loss in the left hemithorax with left chest tube in place. Small pneumothorax laterally. Electronically Signed   By: Narda Rutherford M.D.   On: 03/18/2023 15:36    ASSESSMENT: This is a very pleasant 67 year old African American Male with stage IIA (T2b, N0, M0) non-small cell lung cancer, adenocarcinoma. He presented with a left upper lobe lesion. He was diagnosed in 2024. He is status post surgical resection by Dr. Cliffton Asters on March 18, 2023. We will send molecular studies by [employer]   The patient was seen with Dr. Arbutus Ped. Dr. Arbutus Ped had a lengthy discussion with the patient about his current condition and treatment options. Dr. Arbutus Ped discussed the role of adjuvant treatment with carboplatin and Alimta for 4 cycles. He is not a good candidate for cisplatin due to his CKD. We will monitor his creatinine with the Alimta. The patient is interested in this option and is expected to start about 6-8 weeks after surgery. Therefore, we  will aim to start around 11/6-11/13  We discussed the adverse side effects of treatment including but not limited to alopecia, myelosuppression, nausea and vomiting, peripheral neuropathy, liver or renal dysfunction as well as immunotherapy mediated adverse effects.   I will arrange for the patient to have a chemoeducation class prior to receiving her first cycle of chemotherapy.   He needs to continue to get B12 injections at his PCPs office monthly.     I sent prescriptions for 1 mg  folic acid p.o. daily as well as Compazine 10 mg every 6 hours as needed for nausea.  I will also send in a prescription for Decadron 1 tablet twice daily the day before, the day of, the day after chemotherapy. He will need to monitor his BS closely while on decadron due to his diabetes.  The patient will follow-up in 3-4 weeks for a one-week follow-up visit after completing his first cycle of chemotherapy.  We will need to monitor his anemia closely. He will continue on oral iron supplements for now. We would consider him for a blood transfusion if his Hbg were <8.   The patient voices understanding of current disease status and treatment options and is in agreement with the current care plan.  All questions were answered. The patient knows to call the clinic with any problems, questions or concerns. We can certainly see the patient much sooner if necessary.  Thank you so much for allowing me to participate in the care of [facility]. I will continue to follow up the patient with you and assist in his care.   Disclaimer: This note was dictated with voice recognition software. Similar sounding words can inadvertently be transcribed and may not be corrected upon review.   Jocsan Mcginley L Eron Goble April 13, 2023, 2:28 PM  ADDENDUM: Hematology/Oncology Attending: I had a face-to-face encounter with the patient today.  I reviewed his record, lab, scan as well as the pathology report.  This is a very  pleasant 67 years old African-American male who came to the clinic today accompanied by his wife.  He has a history of hypertension, diabetes mellitus, MGUS, chronic kidney disease, dyslipidemia.  Patient was followed by Dr. Bertis Ruddy for suspicious multiple pulmonary nodules and CT scan of the chest on November 24, 2022 showed increase in size of solid nodule of the left upper lobe with adjacent groundglass highly concerning for primary lung adenocarcinoma.  There was stable small groundglass nodules of the left lower lobe and stable branching nodular opacity of the left lower lobe likely bronchus seal.  A PET scan on 12/09/2022 showed mixed density nodule of the left upper lobe/lingula with focal hypermetabolism suggesting at least a portion of this lesion reflects primary bronchogenic carcinoma likely similar invasive adenocarcinoma.  There was additional focal hypermetabolism in the superior segment of the left lower lobe without corresponding nodule.  There was no finding suspicious for metastatic disease.  The patient was seen by Dr. Tonia Brooms and on 01/05/2023 he underwent video bronchoscopy with robotic assisted bronchoscopic navigation and biopsy of left upper lobe nodule.  The final pathology was nondiagnostic.  The patient and his wife decided to seek medical care at [facility] and he had several studies performed there including the CT scan of the chest without contrast on February 08, 2023 and that showed focal region of consolidation in the lingula favoring pneumonia.  This was visible on the previous imaging studies.  There was scattered tiny 3 mm or smaller pulmonary nodules and incidental 1.3 cm nonspecific left adrenal nodule.  The patient underwent repeat bronchoscopy interval history under the care of Dr. Harriet Butte and the final pathology showed adenocarcinoma, mucinous type.  He was referred back to Dr. Cliffton Asters for surgical intervention close to home.  On March 18, 2023 he underwent robotic  assisted left video thoracoscopy with left upper lobectomy and mediastinal lymph node sampling.  Final pathology [id number]) showed invasive mucinous adenocarcinoma with papillary features measuring 5.0 cm with negative resection margin.  No  evidence for visceral pleural or lymphovascular invasion.  The dissected lymph nodes were negative for malignancy.  The patient was referred to me today for evaluation and recommendation regarding treatment of his condition.  PD-L1 expression was 6% but no molecular studies. I had a lengthy discussion with the patient and his wife today about his current condition and treatment options.  This is a very pleasant 67 years old African-American male with a stage IIa (T2b, N0, M0) non-small cell lung cancer adenocarcinoma and a non-smoker status post left upper lobectomy with lymph node sampling on 03/18/2023 with tumor size of 5.0 cm. I recommended for the patient to consider adjuvant treatment with systemic chemotherapy likely with carboplatin for AUC of 5 and Alimta 500 Mg/M2 every 3 weeks for 3 cycles.  The patient is not a good candidate for adjuvant cisplatin because of his renal insufficiency.  Also given the option of observation and close monitoring. I will also order molecular studies to identify any actionable mutations and if the patient has positive EGFR mutation he could also benefit from adjuvant treatment with osimertinib. I discussed with the patient the adverse effect of this treatment including but not limited to alopecia, myelosuppression, nausea and vomiting, peripheral neuropathy, liver or renal dysfunction. We will arrange for the patient to have a chemotherapy education class before the first dose of his treatment. He is expected to start the first dose of this treatment in around 2 weeks. He will come back for follow-up visit 1 week after his treatment for evaluation and management of any adverse effect of his treatment. He will receive vitamin B12  injection today and we will also arrange for the patient to receive folic acid and Compazine for nausea from his pharmacy. The patient was advised to call immediately if he has any other concerning symptoms in the interval.  The patient and his wife were in agreement with the current plan.

## 2023-04-13 NOTE — Progress Notes (Signed)
New order form filled out and faxed to [facility] for additional molecular studies faxed to [phone]

## 2023-04-13 NOTE — Progress Notes (Signed)
START ON PATHWAY REGIMEN - Non-Small Cell Lung     A cycle is every 21 days:     Pemetrexed      Carboplatin   **Always confirm dose/schedule in your pharmacy ordering system**  Patient Characteristics: Postoperative without Neoadjuvant Therapy (Pathologic Staging), Stage IB (Tumor Size = 4 cm) or Stage II / III, Adjuvant Chemotherapy Planned, Stage IB (Tumor Size = 4 cm), or Stage II / III, Nonsquamous Cell Therapeutic Status: Postoperative without Neoadjuvant Therapy (Pathologic Staging) AJCC T Category: pT2b AJCC N Category: pN0 AJCC M Category: cM0 AJCC 8 Stage Grouping: IIA Adjuvant Chemotherapy Status: Adjuvant Chemotherapy Planned Histology: Nonsquamous Cell Intent of Therapy: Curative Intent, Discussed with Patient

## 2023-04-14 ENCOUNTER — Other Ambulatory Visit: Payer: Self-pay

## 2023-04-14 ENCOUNTER — Encounter (HOSPITAL_COMMUNITY): Payer: Self-pay

## 2023-04-14 ENCOUNTER — Encounter: Payer: Self-pay | Admitting: Internal Medicine

## 2023-04-16 ENCOUNTER — Telehealth: Payer: Self-pay

## 2023-04-16 ENCOUNTER — Other Ambulatory Visit: Payer: Self-pay

## 2023-04-16 NOTE — Patient Outreach (Signed)
Care Management  Transitions of Care Program Transitions of Care Post-discharge week 3   04/16/2023 Name: William Mcguire MRN: [id number] DOB: 01-Feb-1956  Subjective: William Mcguire is a 67 y.o. year old male who is a primary care patient of [name], MD. The Care Management team Engaged with patient Engaged with patient by telephone to assess and address transitions of care needs.   Consent to Services:  Patient was given information about care management services, agreed to services, and gave verbal consent to participate.   Assessment:   Patient voices no new complaints Patient has not developed/ reported any new Medical issues / Dx or acute changes.- since last follow-up call for most recent  Hospital stay    9/25-10/1 / 2024  He was sen by both PCP and Oncology He is beginning Radiation treatments He believes the schedule is q o week x 4 treatments. He has a prep class 04/22/23. He reports continued soreness at incision site no s/s infection. He denies need for pain medication. His metformin is on hold BG runs in the 120s His appetite is improving , he continues occ supplements. He has a stool for OB pending. He did  not see and s/s overt rectal bleeding and believes this was a screening  Patient educated on red flags s/s to watch for and was encouraged to report any of these identified , any new symptoms , changes in baseline or  medication regimen,  change in health status  /  well-being, or safety concerns to PCP and / or the  [facility] Case Management team .          SDOH Interventions    Flowsheet Row Telephone from 03/26/2023 in [place]UL[facility] HE[facility] DEPARTMENT Admission (Discharged) from 03/18/2023 in [facility] [place] [facility] CV PROGRESSIVE CARE Clinical Support from 11/05/2022 in [facility] Chronic Care Management from 05/02/2020 in [facility] Chronic Care Management from 01/30/2020 in [facility]  SDOH Interventions       Food Insecurity Interventions -- -- Intervention Not Indicated Intervention Not Indicated --  Housing Interventions Intervention Not Indicated Intervention Not Indicated Intervention Not Indicated Intervention Not Indicated --  Transportation Interventions Intervention Not Indicated -- Intervention Not Indicated Intervention Not Indicated --  Financial Strain Interventions -- -- Intervention Not Indicated -- Other (Comment)  [Will continue to assist with [employer] and Hospital doctor patient assistance programs as needed]  Physical Activity Interventions -- -- Intervention Not Indicated -- --  Stress Interventions -- -- Intervention Not Indicated -- --  Health Literacy Interventions Intervention Not Indicated -- -- -- --        Goals Addressed             This Visit's Progress    TOC CarePlan       Current Barriers:  Chronic Disease Management support and 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 / UTI, plan is to start   He has a prep class 04/22/23 for Radiation treatments ( total of 4 qoweek)   RNCM Clinical Goal(s):  Patient will work with the Care Management team over the next 30 days to address Transition of Care Barriers: Medication Management Provider appointments take all medications exactly as prescribed and will call provider for medication related questions as evidenced by no missed medications, medications are taken as prescribed( dose, frequency)  attend all scheduled medical appointments: post surgical follow-up , PCP and Oncology if indicated as evidenced by no missed  appointments   through collaboration with RN Care manager, provider, and care team.   Interventions: Evaluation of current treatment plan related to  self management and patient's adherence to plan as established by provider  Transitions of Care:  Goal on track:  Yes. Doctor Visits  - discussed the importance of doctor visits,prep class  and  obtaining  labwork   Patient Goals/Self-Care Activities: Participate in Transition of Care Program/Attend TOC scheduled calls Take all medications as prescribed Attend all scheduled provider appointments Call pharmacy for medication refills 3-7 days in advance of running out of medications Perform all self care activities independently  work with the Care Management team over the next 30 days to address Transition of Care Barriers: Medication Management, post procedural follow-up and symptom management, to avoid hospital readmissions   Follow Up Plan:  Next scheduled visit with TOC Management tea, 04/21/23 1:00pm         Plan: The patient has been provided with contact information for the care management team and has been advised to call with any health related questions or concerns.  Routine follow-up and on-going assessment evaluation and education of disease processes, recommended interventions for both chronic and acute medical conditions , will occur during each weekly visit along with ongoing review of symptoms ,medication reviews and reconciliation. Any updates , inconsistencies, discrepancies or acute care concerns will be addressed and routed to the correct Practitioner if indicated   Please refer to Care Plan for goals and interventions -Effectiveness of interventions, symptom management and outcomes will be evaluated  weekly during [facility] 30-day Program Outreach calls  . Any necessary  changes and updates to Care Plan will be completed episodically    Reviewed goals for care  Patient provided with Contact information and verbalized understanding with current POC.   Susa Loffler , BSN, RN Care Management Coordinator [place]   [facility] [email or web address] Direct Dial: [phone]

## 2023-04-17 ENCOUNTER — Other Ambulatory Visit (INDEPENDENT_AMBULATORY_CARE_PROVIDER_SITE_OTHER): Payer: Medicare Other

## 2023-04-17 ENCOUNTER — Telehealth: Payer: Self-pay | Admitting: Internal Medicine

## 2023-04-17 DIAGNOSIS — D509 Iron deficiency anemia, unspecified: Secondary | ICD-10-CM

## 2023-04-17 LAB — HEMOCCULT GUIAC POC 1CARD (OFFICE)
Card #1 Date: 10122024
Card #2 Fecal Occult Blod, POC: NEGATIVE
Card #3 Fecal Occult Blood, POC: POSITIVE
Fecal Occult Blood, POC: NEGATIVE

## 2023-04-17 NOTE — Telephone Encounter (Signed)
Scheduled patient regarding upcoming appointments, patient is notified.

## 2023-04-17 NOTE — Progress Notes (Signed)
Pharmacist Chemotherapy Monitoring - Initial Assessment    Anticipated start date: 04/27/23   The following has been reviewed per standard work regarding the patient's treatment regimen: The patient's diagnosis, treatment plan and drug doses, and organ/hematologic function Lab orders and baseline tests specific to treatment regimen  The treatment plan start date, drug sequencing, and pre-medications Prior authorization status  Patient's documented medication list, including drug-drug interaction screen and prescriptions for anti-emetics and supportive care specific to the treatment regimen The drug concentrations, fluid compatibility, administration routes, and timing of the medications to be used The patient's access for treatment and lifetime cumulative dose history, if applicable  The patient's medication allergies and previous infusion related reactions, if applicable   Changes made to treatment plan:  N/A  Follow up needed:  N/A   William Mcguire, [facility], 04/17/2023  3:56 PM

## 2023-04-19 DIAGNOSIS — D509 Iron deficiency anemia, unspecified: Secondary | ICD-10-CM | POA: Insufficient documentation

## 2023-04-19 NOTE — Assessment & Plan Note (Signed)
He is encouraged to aim for at least 150 minutes of exercise per week.

## 2023-04-19 NOTE — Assessment & Plan Note (Signed)
He is concerned about anemia. This is likely multifactorial.  Most recent labs reviewed.  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 He was given stool cards to complete. If any of the cards are positive, will refer him to GI for further evaluation.

## 2023-04-19 NOTE — Assessment & Plan Note (Addendum)
He is s/p video thoracoscopy w/ left upper lobectomy. Oncology eval is pending to discuss treatment plan to include adjuvant therapy despite successful resection.

## 2023-04-19 NOTE — Assessment & Plan Note (Signed)
Chronic, fair control. Goal BP<120/80.  He will continue with enalapril 10mg  daily for now. Encouraged to follow low sodium diet.

## 2023-04-19 NOTE — Assessment & Plan Note (Signed)
TCM PERFORMED. A MEMBER OF THE CLINICAL TEAM SPOKE WITH THE PATIENT UPON DISCHARGE. DISCHARGE SUMMARY WAS REVIEWED IN FULL DETAIL DURING THE VISIT. MEDS RECONCILED AND COMPARED TO DISCHARGE MEDS. MEDICATION LIST WAS UPDATED AND REVIEWED WITH THE PATIENT. GREATER THAN 50% FACE TO FACE TIME WAS SPENT IN COUNSELING AND COORDINATION OF CARE. ALL QUESTIONS WERE ANSWERED TO THE SATISFACTION OF THE PATIENT. He is encouraged to complete full course of abx.

## 2023-04-19 NOTE — Assessment & Plan Note (Signed)
Chronic, he is encouraged to keep BP/BS well controlled and to stay well hydrated to decrease risk of CKD progression.

## 2023-04-21 ENCOUNTER — Other Ambulatory Visit: Payer: Self-pay | Admitting: Internal Medicine

## 2023-04-21 ENCOUNTER — Encounter: Payer: Self-pay | Admitting: Internal Medicine

## 2023-04-21 DIAGNOSIS — D509 Iron deficiency anemia, unspecified: Secondary | ICD-10-CM

## 2023-04-21 DIAGNOSIS — R195 Other fecal abnormalities: Secondary | ICD-10-CM

## 2023-04-21 NOTE — Progress Notes (Deleted)
[facility] OFFICE PROGRESS NOTE  [name], MD [address] [place] [address]  DIAGNOSIS: stage IIA (T2b, N0, M0) non-small cell lung cancer, adenocarcinoma. He presented with a left upper lobe lesion. He was diagnosed in 2024.   Molecular Studies:***  PRIOR THERAPY: He is status post surgical resection by Dr. Cliffton Asters on March 18, 2023.   CURRENT THERAPY: Adjuvant chemotherapy with carboplatin for an AUC of 5 and Alimta 500 mg/m2/ First dose on .04/24/23  INTERVAL HISTORY: William Mcguire 67 y.o. male returns for *** regular *** visit for followup of ***   MEDICAL HISTORY: Past Medical History:  Diagnosis Date   Diabetes mellitus    High cholesterol    Hypertension     ALLERGIES:  has No Known Allergies.  MEDICATIONS:  Current Outpatient Medications  Medication Sig Dispense Refill   amLODipine (NORVASC) 10 MG tablet TAKE 1 TABLET BY MOUTH EVERY DAY 90 tablet 1   aspirin EC 81 MG tablet Take 81 mg by mouth daily. Swallow whole.     atorvastatin (LIPITOR) 40 MG tablet TAKE 1 TABLET BY MOUTH EVERY DAY 90 tablet 1   cyanocobalamin (VITAMIN B12) 1000 MCG/ML injection INJECT 1 ML (1,000 MCG TOTAL) INTO THE MUSCLE EVERY 30 DAYS. 9 mL 1   dexamethasone (DECADRON) 4 MG tablet Take 1 tablet twice a day the day before, the day of, and the day after chemotherapy 40 tablet 2   folic acid (FOLVITE) 1 MG tablet Take 1 tablet (1 mg total) by mouth daily. 30 tablet 2   glucose blood (ONETOUCH VERIO) test strip Use as instructed to check blood sugars twice daily E11.69 100 each 2   glucose blood test strip Use as instructed to test blood sugar 3 times a day. Dx code: e11.65 100 each 2   insulin glargine (LANTUS) 100 UNIT/ML injection Inject 21 Units into the skin at bedtime.     oxyCODONE (OXY IR/ROXICODONE) 5 MG immediate release tablet Take 1 tablet (5 mg total) by mouth every 6 (six) hours as needed for moderate pain. 28 tablet 0   prochlorperazine  (COMPAZINE) 10 MG tablet Take 1 tablet (10 mg total) by mouth every 6 (six) hours as needed. 30 tablet 2   sildenafil (VIAGRA) 100 MG tablet TAKE 1 TABLET BY MOUTH EVERY DAY AS NEEDED (INSURANCE COVERS 10 TAB PER 77DAYS) 10 tablet 5   SYRINGE-NEEDLE, DISP, 3 ML (B-D INTEGRA SYRINGE) 25G X 5/8" 3 ML MISC USE AS DIRECTED 12 each 24   No current facility-administered medications for this visit.    SURGICAL HISTORY:  Past Surgical History:  Procedure Laterality Date   BRONCHIAL BIOPSY  01/05/2023   Procedure: BRONCHIAL BIOPSIES;  Surgeon: Josephine Igo, DO;  Location: [facility];  Service: Pulmonary;;   BRONCHIAL BRUSHINGS  01/05/2023   Procedure: BRONCHIAL BRUSHINGS;  Surgeon: Josephine Igo, DO;  Location: [facility];  Service: Pulmonary;;   BRONCHIAL NEEDLE ASPIRATION BIOPSY  01/05/2023   Procedure: BRONCHIAL NEEDLE ASPIRATION BIOPSIES;  Surgeon: Josephine Igo, DO;  Location: [facility];  Service: Pulmonary;;   INTERCOSTAL NERVE BLOCK Left 03/18/2023   Procedure: INTERCOSTAL NERVE BLOCK;  Surgeon: Corliss Skains, MD;  Location: [facility] OR;  Service: Thoracic;  Laterality: Left;   KNEE SURGERY Left 2004   torn cartilage   LYMPH NODE BIOPSY Left 03/18/2023   Procedure: LYMPH NODE BIOPSY;  Surgeon: Corliss Skains, MD;  Location: [facility] OR;  Service: Thoracic;  Laterality: Left;   PATELLAR  TENDON REPAIR  06/11/2011   Procedure: PATELLA TENDON REPAIR;  Surgeon: Cammy Copa;  Location: [facility];  Service: Orthopedics;  Laterality: Right;    REVIEW OF SYSTEMS:   Review of Systems  Constitutional: Negative for appetite change, chills, fatigue, fever and unexpected weight change.  HENT:   Negative for mouth sores, nosebleeds, sore throat and trouble swallowing.   Eyes: Negative for eye problems and icterus.  Respiratory: Negative for cough, hemoptysis, shortness of breath and wheezing.   Cardiovascular: Negative for chest pain and leg swelling.  Gastrointestinal: Negative for  abdominal pain, constipation, diarrhea, nausea and vomiting.  Genitourinary: Negative for bladder incontinence, difficulty urinating, dysuria, frequency and hematuria.   Musculoskeletal: Negative for back pain, gait problem, neck pain and neck stiffness.  Skin: Negative for itching and rash.  Neurological: Negative for dizziness, extremity weakness, gait problem, headaches, light-headedness and seizures.  Hematological: Negative for adenopathy. Does not bruise/bleed easily.  Psychiatric/Behavioral: Negative for confusion, depression and sleep disturbance. The patient is not nervous/anxious.     PHYSICAL EXAMINATION:  There were no vitals taken for this visit.  ECOG PERFORMANCE STATUS: {CHL ONC ECOG [id number]  Physical Exam  Constitutional: Oriented to person, place, and time and well-developed, well-nourished, and in no distress. No distress.  HENT:  Head: Normocephalic and atraumatic.  Mouth/Throat: Oropharynx is clear and moist. No oropharyngeal exudate.  Eyes: Conjunctivae are normal. Right eye exhibits no discharge. Left eye exhibits no discharge. No scleral icterus.  Neck: Normal range of motion. Neck supple.  Cardiovascular: Normal rate, regular rhythm, normal heart sounds and intact distal pulses.   Pulmonary/Chest: Effort normal and breath sounds normal. No respiratory distress. No wheezes. No rales.  Abdominal: Soft. Bowel sounds are normal. Exhibits no distension and no mass. There is no tenderness.  Musculoskeletal: Normal range of motion. Exhibits no edema.  Lymphadenopathy:    No cervical adenopathy.  Neurological: Alert and oriented to person, place, and time. Exhibits normal muscle tone. Gait normal. Coordination normal.  Skin: Skin is warm and dry. No rash noted. Not diaphoretic. No erythema. No pallor.  Psychiatric: Mood, memory and judgment normal.  Vitals reviewed.  LABORATORY DATA: Lab Results  Component Value Date   WBC 5.9 04/13/2023   HGB 9.3 (L)  04/13/2023   HCT 28.6 (L) 04/13/2023   MCV 88.5 04/13/2023   PLT 199 04/13/2023      Chemistry      Component Value Date/Time   NA 134 (L) 04/13/2023 1313   NA 137 11/05/2022 1152   K 4.0 04/13/2023 1313   CL 103 04/13/2023 1313   CO2 27 04/13/2023 1313   BUN 11 04/13/2023 1313   BUN 11 11/05/2022 1152   CREATININE 1.32 (H) 04/13/2023 1313      Component Value Date/Time   CALCIUM 9.3 04/13/2023 1313   ALKPHOS 117 04/13/2023 1313   AST 20 04/13/2023 1313   ALT 15 04/13/2023 1313   BILITOT 0.5 04/13/2023 1313       RADIOGRAPHIC STUDIES:  CT Chest W Contrast  Result Date: 03/26/2023 CLINICAL DATA:  Lung/mediastinal abscess recent L lobectomy, new fevers EXAM: CT CHEST WITH CONTRAST TECHNIQUE: Multidetector CT imaging of the chest was performed during intravenous contrast administration. RADIATION DOSE REDUCTION: This exam was performed according to the departmental dose-optimization program which includes automated exposure control, adjustment of the mA and/or kV according to patient size and/or use of iterative reconstruction technique. CONTRAST:  50mL OMNIPAQUE IOHEXOL 350 MG/ML SOLN COMPARISON:  Chest x-ray today.  CT  03/12/2023 FINDINGS: Cardiovascular: Heart is normal size. Aorta is normal caliber. Mediastinum/Nodes: No mediastinal, hilar, or axillary adenopathy. Trachea and esophagus are unremarkable. Multiple nodules within the thyroid, the largest 2 cm in the anterior left thyroid towards the isthmus. This has been evaluated on previous imaging. (ref: J Am Coll Radiol. 2015 Feb;12(2): 143-50). Lungs/Pleura: Prior left upper lobectomy. Small to moderate hydropneumothorax. Right lung clear. No effusion on the right. Upper Abdomen: No acute findings Musculoskeletal: Chest wall soft tissues are unremarkable. No acute bony abnormality. IMPRESSION: Prior left upper lobectomy. Small to moderate left hydropneumothorax. Electronically Signed   By: Charlett Nose M.D.   On: 03/26/2023 21:09    DG Chest 2 View  Result Date: 03/26/2023 CLINICAL DATA:  Shortness of breath and cough, initial encounter EXAM: CHEST - 2 VIEW COMPARISON:  03/24/2023 FINDINGS: Cardiac shadow is within normal limits. Small apical pneumothorax is again identified. Better visualized on today's exam is the known left upper lobe mass lesion. Small left-sided pleural effusion is noted. IMPRESSION: Stable left apical pneumothorax. Small left effusion and known anterior left upper lobe mass. Electronically Signed   By: Alcide Clever M.D.   On: 03/26/2023 19:16   DG CHEST PORT 1 VIEW  Result Date: 03/24/2023 CLINICAL DATA:  Pneumothorax follow-up. EXAM: PORTABLE CHEST 1 VIEW COMPARISON:  Chest x-ray from yesterday. FINDINGS: Interval removal of the left-sided chest tube. Small left apical pneumothorax is not significantly changed. No consolidation or pleural effusion. Stable cardiomediastinal silhouette with postsurgical changes in the left hilum. IMPRESSION: 1. Unchanged small left apical pneumothorax status post chest tube removal. Electronically Signed   By: Obie Dredge M.D.   On: 03/24/2023 11:38   DG Chest 2 View  Result Date: 03/23/2023 CLINICAL DATA:  Follow-up pneumothorax EXAM: CHEST - 2 VIEW COMPARISON:  03/23/2023, 03/22/2023, 03/20/2019 for FINDINGS: Left-sided chest tube remains in place. There is a trace left apical pneumothorax demonstrating 6 mm pleural-parenchymal separation at the apex. Postsurgical changes in the left hilar area. Improved aeration left lung base. Stable cardiomediastinal silhouette IMPRESSION: Left chest tube remains in place with trace left apical pneumothorax better visualized. Electronically Signed   By: Jasmine Pang M.D.   On: 03/23/2023 16:33   DG Chest 1 View  Result Date: 03/23/2023 CLINICAL DATA:  Surgery follow-up. EXAM: CHEST  1 VIEW COMPARISON:  Radiograph yesterday. FINDINGS: Postsurgical change in the left hemithorax with volume loss and chain sutures at the hilum.  Left-sided chest tube is in the mid lung directed towards the apex. No convincing pneumothorax. Overall low lung volumes. Ill-defined opacity in the bases, more so on the left. Stable heart size and mediastinal contours allowing for anti lordotic positioning on the current exam. IMPRESSION: 1. Postsurgical change in the left hemithorax. Left-sided chest tube in place. No convincing pneumothorax. 2. Ill-defined opacity in the bases, more so on the left, likely atelectasis. Electronically Signed   By: Narda Rutherford M.D.   On: 03/23/2023 10:37     ASSESSMENT/PLAN:  No problem-specific Assessment & Plan notes found for this encounter.   No orders of the defined types were placed in this encounter.    I spent {CHL ONC TIME VISIT - WUJWJ:[id number]} counseling the patient face to face. The total time spent in the appointment was {CHL ONC TIME VISIT - OZHYQ:[id number]}.  William Yeley L Ozetta Flatley, PA-C 04/21/23

## 2023-04-22 ENCOUNTER — Other Ambulatory Visit: Payer: Self-pay | Admitting: Internal Medicine

## 2023-04-22 ENCOUNTER — Inpatient Hospital Stay: Payer: Medicare Other

## 2023-04-23 ENCOUNTER — Telehealth: Payer: Self-pay

## 2023-04-23 ENCOUNTER — Other Ambulatory Visit: Payer: Self-pay

## 2023-04-23 NOTE — Patient Outreach (Signed)
Care Management  Transitions of Care Program Transitions of Care Post-discharge week 4   04/23/2023 Name: William Mcguire MRN: [id number] DOB: 23-Jun-1956  Subjective: William Mcguire is a 67 y.o. year old male who is a primary care patient of [name], MD. The Care Management team Engaged with patient Engaged with patient by telephone to assess and address transitions of care needs.   Consent to Services:  Patient was given information about care management services, agreed to services, and gave verbal consent to participate.   Assessment:   Patient voices no new complaints Patient has not developed/ reported any new Medical issues / Dx or acute changes.- since last follow-up call for most recent  Hospital stay  03/18/2023-10/1  / 2024  He is in good spirits. He is doing well,  reports some ongoing soreness to incision site, area is healed with small area of scar tissue.  His Metformin remains on hold- He stated his BG runs in the 120 ,he has had no episodes of hyper or hypoglycemia  He attended the Chemotherapy prep class 10/28. He found it very informative and did not have any additional questions. He will have his 1st Chemo Treatment 04/27/23 with a 1 every other week x 4 schedule.  Patient educated on red flags s/s to watch for and was encouraged to report any of these identified , any new symptoms , changes in baseline or  medication regimen,  change in health status  /  well-being, or safety concerns to PCP and / or the  [facility] Case Management team .          SDOH Interventions    Flowsheet Row Telephone from 03/26/2023 in [facility] POPULATION HEALTH DEPARTMENT Admission (Discharged) from 03/18/2023 in [facility] CV PROGRESSIVE CARE Clinical Support from 11/05/2022 in [facility] Chronic Care Management from 05/02/2020 in [facility] Chronic Care Management from 01/30/2020 in [facility]  SDOH Interventions       Food Insecurity Interventions -- -- Intervention Not Indicated Intervention Not Indicated --  Housing Interventions Intervention Not Indicated Intervention Not Indicated Intervention Not Indicated Intervention Not Indicated --  Transportation Interventions Intervention Not Indicated -- Intervention Not Indicated Intervention Not Indicated --  Financial Strain Interventions -- -- Intervention Not Indicated -- Other (Comment)  [Will continue to assist with [facility] [facility] Hospital doctor patient assistance programs as needed]  Physical Activity Interventions -- -- Intervention Not Indicated -- --  Stress Interventions -- -- Intervention Not Indicated -- --  Health Literacy Interventions Intervention Not Indicated -- -- -- --        Goals Addressed             This Visit's Progress    TOC CarePlan       Current Barriers:  Chronic Disease Management support and 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 / UTI, plan is to start   He has a prep class 04/22/23 for Radiation treatments ( total of 4 qoweek)- completed class 1st TX 11/4    RNCM Clinical Goal(s):  Patient will work with the Care Management team over the next 30 days to address Transition of Care Barriers: Medication Management Provider appointments take all medications exactly as prescribed and will call provider for medication related questions as evidenced by no missed medications, medications are taken as prescribed( dose, frequency)  attend all scheduled medical appointments: post surgical follow-up , PCP and Oncology if indicated as evidenced  by no missed appointments   through collaboration with RN Care manager, provider, and care team.   Interventions: Evaluation of current treatment plan related to  self management and patient's adherence to plan as established by provider  Transitions of Care:  Goal on track:  Yes. Doctor Visits  - discussed the importance of doctor visits,prep class   and  obtaining labwork   Patient Goals/Self-Care Activities: Participate in Transition of Care Program/Attend TOC scheduled calls Take all medications as prescribed Attend all scheduled provider appointments Call pharmacy for medication refills 3-7 days in advance of running out of medications Perform all self care activities independently  work with the Care Management team over the next 30 days to address Transition of Care Barriers: Medication Management, post procedural follow-up and symptom management, to avoid hospital readmissions   Follow Up Plan:  Next scheduled visit with TOC Management tea, 04/29/23 1:00pm         Plan: The patient has been provided with contact information for the care management team and has been advised to call with any health related questions or concerns.  Routine follow-up and on-going assessment evaluation and education of disease processes, recommended interventions for both chronic and acute medical conditions , will occur during each weekly visit along with ongoing review of symptoms ,medication reviews and reconciliation. Any updates , inconsistencies, discrepancies or acute care concerns will be addressed and routed to the correct Practitioner if indicated   Please refer to Care Plan for goals and interventions -Effectiveness of interventions, symptom management and outcomes will be evaluated  weekly during [facility] 30-day Program Outreach calls  . Any necessary  changes and updates to Care Plan will be completed episodically    Reviewed goals for care  Patient provided with Contact information and verbalized understanding with current POC.   Susa Loffler , BSN, RN Care Management Coordinator [facility]   [facility] [email or web address] Direct Dial: [phone]

## 2023-04-24 ENCOUNTER — Other Ambulatory Visit: Payer: Self-pay

## 2023-04-24 MED FILL — Fosaprepitant Dimeglumine For IV Infusion 150 MG (Base Eq): INTRAVENOUS | Qty: 5 | Status: AC

## 2023-04-27 ENCOUNTER — Ambulatory Visit: Payer: Medicare Other | Admitting: Physician Assistant

## 2023-04-27 ENCOUNTER — Inpatient Hospital Stay: Payer: Medicare Other | Attending: Hematology and Oncology

## 2023-04-27 ENCOUNTER — Other Ambulatory Visit: Payer: Medicare Other

## 2023-04-27 ENCOUNTER — Encounter: Payer: Self-pay | Admitting: Internal Medicine

## 2023-04-27 ENCOUNTER — Inpatient Hospital Stay: Payer: Medicare Other

## 2023-04-27 VITALS — BP 135/72 | HR 64 | Temp 98.0°F | Resp 18 | Wt 243.5 lb

## 2023-04-27 DIAGNOSIS — N3001 Acute cystitis with hematuria: Secondary | ICD-10-CM | POA: Insufficient documentation

## 2023-04-27 DIAGNOSIS — D6959 Other secondary thrombocytopenia: Secondary | ICD-10-CM | POA: Insufficient documentation

## 2023-04-27 DIAGNOSIS — Z5111 Encounter for antineoplastic chemotherapy: Secondary | ICD-10-CM | POA: Insufficient documentation

## 2023-04-27 DIAGNOSIS — C3412 Malignant neoplasm of upper lobe, left bronchus or lung: Secondary | ICD-10-CM | POA: Insufficient documentation

## 2023-04-27 DIAGNOSIS — Z5189 Encounter for other specified aftercare: Secondary | ICD-10-CM | POA: Diagnosis not present

## 2023-04-27 DIAGNOSIS — R197 Diarrhea, unspecified: Secondary | ICD-10-CM | POA: Insufficient documentation

## 2023-04-27 DIAGNOSIS — T451X5A Adverse effect of antineoplastic and immunosuppressive drugs, initial encounter: Secondary | ICD-10-CM | POA: Diagnosis not present

## 2023-04-27 DIAGNOSIS — D6481 Anemia due to antineoplastic chemotherapy: Secondary | ICD-10-CM | POA: Insufficient documentation

## 2023-04-27 DIAGNOSIS — N182 Chronic kidney disease, stage 2 (mild): Secondary | ICD-10-CM | POA: Insufficient documentation

## 2023-04-27 DIAGNOSIS — D649 Anemia, unspecified: Secondary | ICD-10-CM

## 2023-04-27 DIAGNOSIS — Z807 Family history of other malignant neoplasms of lymphoid, hematopoietic and related tissues: Secondary | ICD-10-CM | POA: Insufficient documentation

## 2023-04-27 DIAGNOSIS — C3492 Malignant neoplasm of unspecified part of left bronchus or lung: Secondary | ICD-10-CM

## 2023-04-27 DIAGNOSIS — D701 Agranulocytosis secondary to cancer chemotherapy: Secondary | ICD-10-CM | POA: Insufficient documentation

## 2023-04-27 LAB — CMP (CANCER CENTER ONLY)
ALT: 18 U/L (ref 0–44)
AST: 22 U/L (ref 15–41)
Albumin: 3.7 g/dL (ref 3.5–5.0)
Alkaline Phosphatase: 129 U/L — ABNORMAL HIGH (ref 38–126)
Anion gap: 4 — ABNORMAL LOW (ref 5–15)
BUN: 15 mg/dL (ref 8–23)
CO2: 25 mmol/L (ref 22–32)
Calcium: 9.7 mg/dL (ref 8.9–10.3)
Chloride: 104 mmol/L (ref 98–111)
Creatinine: 1.32 mg/dL — ABNORMAL HIGH (ref 0.61–1.24)
GFR, Estimated: 58 mL/min — ABNORMAL LOW (ref >60)
Glucose, Bld: 283 mg/dL — ABNORMAL HIGH (ref 70–99)
Potassium: 4.2 mmol/L (ref 3.5–5.1)
Sodium: 133 mmol/L — ABNORMAL LOW (ref 135–145)
Total Bilirubin: 0.4 mg/dL (ref <1.2)
Total Protein: 9 g/dL — ABNORMAL HIGH (ref 6.5–8.1)

## 2023-04-27 LAB — CBC WITH DIFFERENTIAL (CANCER CENTER ONLY)
Abs Immature Granulocytes: 0.09 10*3/uL — ABNORMAL HIGH (ref 0.00–0.07)
Basophils Absolute: 0 10*3/uL (ref 0.0–0.1)
Basophils Relative: 0 %
Eosinophils Absolute: 0 10*3/uL (ref 0.0–0.5)
Eosinophils Relative: 0 %
HCT: 29.4 % — ABNORMAL LOW (ref 39.0–52.0)
Hemoglobin: 9.9 g/dL — ABNORMAL LOW (ref 13.0–17.0)
Immature Granulocytes: 1 %
Lymphocytes Relative: 11 %
Lymphs Abs: 1 10*3/uL (ref 0.7–4.0)
MCH: 29.6 pg (ref 26.0–34.0)
MCHC: 33.7 g/dL (ref 30.0–36.0)
MCV: 87.8 fL (ref 80.0–100.0)
Monocytes Absolute: 0.6 10*3/uL (ref 0.1–1.0)
Monocytes Relative: 6 %
Neutro Abs: 7.9 10*3/uL — ABNORMAL HIGH (ref 1.7–7.7)
Neutrophils Relative %: 82 %
Platelet Count: 219 10*3/uL (ref 150–400)
RBC: 3.35 MIL/uL — ABNORMAL LOW (ref 4.22–5.81)
RDW: 13.3 % (ref 11.5–15.5)
WBC Count: 9.6 10*3/uL (ref 4.0–10.5)
nRBC: 0 % (ref 0.0–0.2)

## 2023-04-27 LAB — SAMPLE TO BLOOD BANK

## 2023-04-27 MED ORDER — SODIUM CHLORIDE 0.9% FLUSH: 10.0000 mL | INTRAVENOUS | Status: DC | PRN

## 2023-04-27 MED ORDER — SODIUM CHLORIDE 0.9 % IV SOLN
150.0000 mg | Freq: Once | INTRAVENOUS | Status: AC
  Administered 2023-04-27: 150 mg via INTRAVENOUS
  Filled 2023-04-27: qty 150

## 2023-04-27 MED ORDER — DEXAMETHASONE SODIUM PHOSPHATE 10 MG/ML IJ SOLN
10.0000 mg | Freq: Once | INTRAMUSCULAR | Status: AC
Start: 2023-04-27 — End: 2023-04-27
  Administered 2023-04-27: 10 mg via INTRAVENOUS
  Filled 2023-04-27: qty 1

## 2023-04-27 MED ORDER — HEPARIN SOD (PORK) LOCK FLUSH 100 UNIT/ML IV SOLN: 500.0000 [IU] | Freq: Once | INTRAVENOUS | Status: DC | PRN

## 2023-04-27 MED ORDER — PALONOSETRON HCL INJECTION 0.25 MG/5ML
0.2500 mg | Freq: Once | INTRAVENOUS | Status: AC
  Administered 2023-04-27: 0.25 mg via INTRAVENOUS
  Filled 2023-04-27: qty 5

## 2023-04-27 MED ORDER — SODIUM CHLORIDE 0.9 % IV SOLN
500.0000 mg/m2 | Freq: Once | INTRAVENOUS | Status: AC
Start: 2023-04-27 — End: 2023-04-27
  Administered 2023-04-27: 1200 mg via INTRAVENOUS
  Filled 2023-04-27: qty 40

## 2023-04-27 MED ORDER — SODIUM CHLORIDE 0.9 % IV SOLN: INTRAVENOUS | Status: DC

## 2023-04-27 MED ORDER — CARBOPLATIN CHEMO INJECTION 600 MG/60ML
539.5000 mg | Freq: Once | INTRAVENOUS | Status: AC
  Administered 2023-04-27: 539.5 mg via INTRAVENOUS
  Filled 2023-04-27: qty 53.95

## 2023-04-27 NOTE — Patient Instructions (Addendum)
[place] [facility]  Discharge Instructions: Thank you for choosing [facility] to provide your oncology and hematology care.   If you have a lab appointment with the Cancer Center, please go directly to the Cancer Center and check in at the registration area.   Wear comfortable clothing and clothing appropriate for easy access to any Portacath or PICC line.   We strive to give you quality time with your provider. You may need to reschedule your appointment if you arrive late (15 or more minutes).  Arriving late affects you and other patients whose appointments are after yours.  Also, if you miss three or more appointments without notifying the office, you may be dismissed from the clinic at the provider's discretion.      For prescription refill requests, have your pharmacy contact our office and allow 72 hours for refills to be completed.    Today you received the following chemotherapy and/or immunotherapy agents: Alimta and Carboplatin      To help prevent nausea and vomiting after your treatment, we encourage you to take your nausea medication as directed.  BELOW ARE SYMPTOMS THAT SHOULD BE REPORTED IMMEDIATELY: *FEVER GREATER THAN 100.4 F (38 C) OR HIGHER *CHILLS OR SWEATING *NAUSEA AND VOMITING THAT IS NOT CONTROLLED WITH YOUR NAUSEA MEDICATION *UNUSUAL SHORTNESS OF BREATH *UNUSUAL BRUISING OR BLEEDING *URINARY PROBLEMS (pain or burning when urinating, or frequent urination) *BOWEL PROBLEMS (unusual diarrhea, constipation, pain near the anus) TENDERNESS IN MOUTH AND THROAT WITH OR WITHOUT PRESENCE OF ULCERS (sore throat, sores in mouth, or a toothache) UNUSUAL RASH, SWELLING OR PAIN  UNUSUAL VAGINAL DISCHARGE OR ITCHING   Items with * indicate a potential emergency and should be followed up as soon as possible or go to the Emergency Department if any problems should occur.  Please show the CHEMOTHERAPY ALERT CARD or IMMUNOTHERAPY ALERT  CARD at check-in to the Emergency Department and triage nurse.  Should you have questions after your visit or need to cancel or reschedule your appointment, please contact [place] [facility]  Dept: [phone]  and follow the prompts.  Office hours are 8:00 a.m. to 4:30 p.m. Monday - Friday. Please note that voicemails left after 4:00 p.m. may not be returned until the following business day.  We are closed weekends and major holidays. You have access to a nurse at all times for urgent questions. Please call the main number to the clinic Dept: [phone] and follow the prompts.   For any non-urgent questions, you may also contact your provider using MyChart. We now offer e-Visits for anyone 83 and older to request care online for non-urgent symptoms. For details visit [email or web address].   Also download the MyChart app! Go to the app store, search "MyChart", open the app, select [place], and log in with your MyChart username and password.  Pemetrexed Injection What is this medication? PEMETREXED (PEM e TREX ed) treats some types of cancer. It works by slowing down the growth of cancer cells. This medicine may be used for other purposes; ask your health care provider or pharmacist if you have questions. COMMON BRAND NAME(S): Alimta, PEMFEXY, PEMRYDI RTU What should I tell my care team before I take this medication? They need to know if you have any of these conditions: Infection, such as chickenpox, cold sores, or herpes Kidney disease Low blood cell levels (white cells, red cells, and platelets) Lung or breathing disease, such as asthma Radiation therapy  An unusual or allergic reaction to pemetrexed, other medications, foods, dyes, or preservatives If you or your partner are pregnant or trying to get pregnant Breast-feeding How should I use this medication? This medication is injected into a vein. It is given by your care team in a hospital or clinic  setting. Talk to your care team about the use of this medication in children. Special care may be needed. Overdosage: If you think you have taken too much of this medicine contact a poison control center or emergency room at once. NOTE: This medicine is only for you. Do not share this medicine with others. What if I miss a dose? Keep appointments for follow-up doses. It is important not to miss your dose. Call your care team if you are unable to keep an appointment. What may interact with this medication? Do not take this medication with any of the following: Live virus vaccines This medication may also interact with the following: Ibuprofen This list may not describe all possible interactions. Give your health care provider a list of all the medicines, herbs, non-prescription drugs, or dietary supplements you use. Also tell them if you smoke, drink alcohol, or use illegal drugs. Some items may interact with your medicine. What should I watch for while using this medication? Your condition will be monitored carefully while you are receiving this medication. This medication may make you feel generally unwell. This is not uncommon as chemotherapy can affect healthy cells as well as cancer cells. Report any side effects. Continue your course of treatment even though you feel ill unless your care team tells you to stop. This medication can cause serious side effects. To reduce the risk, your care team may give you other medications to take before receiving this one. Be sure to follow the directions from your care team. This medication can cause a rash or redness in areas of the body that have previously had radiation therapy. If you have had radiation therapy, tell your care team if you notice a rash in this area. This medication may increase your risk of getting an infection. Call your care team for advice if you get a fever, chills, sore throat, or other symptoms of a cold or flu. Do not treat  yourself. Try to avoid being around people who are sick. Be careful brushing or flossing your teeth or using a toothpick because you may get an infection or bleed more easily. If you have any dental work done, tell your dentist you are receiving this medication. Avoid taking medications that contain aspirin, acetaminophen, ibuprofen, naproxen, or ketoprofen unless instructed by your care team. These medications may hide a fever. Check with your care team if you have severe diarrhea, nausea, and vomiting, or if you sweat a lot. The loss of too much body fluid may make it dangerous for you to take this medication. Talk to your care team if you or your partner wish to become pregnant or think either of you might be pregnant. This medication can cause serious birth defects if taken during pregnancy and for 6 months after the last dose. A negative pregnancy test is required before starting this medication. A reliable form of contraception is recommended while taking this medication and for 6 months after the last dose. Talk to your care team about reliable forms of contraception. Do not father a child while taking this medication and for 3 months after the last dose. Use a condom while having sex during this time period. Do not  breastfeed while taking this medication and for 1 week after the last dose. This medication may cause infertility. Talk to your care team if you are concerned about your fertility. What side effects may I notice from receiving this medication? Side effects that you should report to your care team as soon as possible: Allergic reactions--skin rash, itching, hives, swelling of the face, lips, tongue, or throat Dry cough, shortness of breath or trouble breathing Infection--fever, chills, cough, sore throat, wounds that don't heal, pain or trouble when passing urine, general feeling of discomfort or being unwell Kidney injury--decrease in the amount of urine, swelling of the ankles, hands,  or feet Low red blood cell level--unusual weakness or fatigue, dizziness, headache, trouble breathing Redness, blistering, peeling, or loosening of the skin, including inside the mouth Unusual bruising or bleeding Side effects that usually do not require medical attention (report to your care team if they continue or are bothersome): Fatigue Loss of appetite Nausea Vomiting This list may not describe all possible side effects. Call your doctor for medical advice about side effects. You may report side effects to FDA at 1-800-FDA-1088. Where should I keep my medication? This medication is given in a hospital or clinic. It will not be stored at home. NOTE: This sheet is a summary. It may not cover all possible information. If you have questions about this medicine, talk to your doctor, pharmacist, or health care provider.  2024 Elsevier/Gold Standard (2021-10-15 00:00:00)  Carboplatin Injection What is this medication? CARBOPLATIN (KAR boe pla tin) treats some types of cancer. It works by slowing down the growth of cancer cells. This medicine may be used for other purposes; ask your health care provider or pharmacist if you have questions. COMMON BRAND NAME(S): Paraplatin What should I tell my care team before I take this medication? They need to know if you have any of these conditions: Blood disorders Hearing problems Kidney disease Recent or ongoing radiation therapy An unusual or allergic reaction to carboplatin, cisplatin, other medications, foods, dyes, or preservatives Pregnant or trying to get pregnant Breast-feeding How should I use this medication? This medication is injected into a vein. It is given by your care team in a hospital or clinic setting. Talk to your care team about the use of this medication in children. Special care may be needed. Overdosage: If you think you have taken too much of this medicine contact a poison control center or emergency room at once. NOTE:  This medicine is only for you. Do not share this medicine with others. What if I miss a dose? Keep appointments for follow-up doses. It is important not to miss your dose. Call your care team if you are unable to keep an appointment. What may interact with this medication? Medications for seizures Some antibiotics, such as amikacin, gentamicin, neomycin, streptomycin, tobramycin Vaccines This list may not describe all possible interactions. Give your health care provider a list of all the medicines, herbs, non-prescription drugs, or dietary supplements you use. Also tell them if you smoke, drink alcohol, or use illegal drugs. Some items may interact with your medicine. What should I watch for while using this medication? Your condition will be monitored carefully while you are receiving this medication. You may need blood work while taking this medication. This medication may make you feel generally unwell. This is not uncommon, as chemotherapy can affect healthy cells as well as cancer cells. Report any side effects. Continue your course of treatment even though you feel ill unless  your care team tells you to stop. In some cases, you may be given additional medications to help with side effects. Follow all directions for their use. This medication may increase your risk of getting an infection. Call your care team for advice if you get a fever, chills, sore throat, or other symptoms of a cold or flu. Do not treat yourself. Try to avoid being around people who are sick. Avoid taking medications that contain aspirin, acetaminophen, ibuprofen, naproxen, or ketoprofen unless instructed by your care team. These medications may hide a fever. Be careful brushing or flossing your teeth or using a toothpick because you may get an infection or bleed more easily. If you have any dental work done, tell your dentist you are receiving this medication. Talk to your care team if you wish to become pregnant or think  you might be pregnant. This medication can cause serious birth defects. Talk to your care team about effective forms of contraception. Do not breast-feed while taking this medication. What side effects may I notice from receiving this medication? Side effects that you should report to your care team as soon as possible: Allergic reactions--skin rash, itching, hives, swelling of the face, lips, tongue, or throat Infection--fever, chills, cough, sore throat, wounds that don't heal, pain or trouble when passing urine, general feeling of discomfort or being unwell Low red blood cell level--unusual weakness or fatigue, dizziness, headache, trouble breathing Pain, tingling, or numbness in the hands or feet, muscle weakness, change in vision, confusion or trouble speaking, loss of balance or coordination, trouble walking, seizures Unusual bruising or bleeding Side effects that usually do not require medical attention (report to your care team if they continue or are bothersome): Hair loss Nausea Unusual weakness or fatigue Vomiting This list may not describe all possible side effects. Call your doctor for medical advice about side effects. You may report side effects to FDA at 1-800-FDA-1088. Where should I keep my medication? This medication is given in a hospital or clinic. It will not be stored at home. NOTE: This sheet is a summary. It may not cover all possible information. If you have questions about this medicine, talk to your doctor, pharmacist, or health care provider.  2024 Elsevier/Gold Standard (2021-10-01 00:00:00)

## 2023-04-28 ENCOUNTER — Telehealth: Payer: Self-pay

## 2023-04-28 NOTE — Telephone Encounter (Signed)
LM for patient that this nurse was calling to see how they were doing after their treatment. Please call back to Dr. Mohamed's nurse at [phone] if they have any questions or concerns regarding the treatment.  

## 2023-04-28 NOTE — Telephone Encounter (Signed)
-----   Message from Nurse Lanora Manis A sent at 04/27/2023  2:09 PM EST ----- Regarding: First time 04/27/23- First time Alimta/Carboplatin. Dr. Arbutus Ped. Tolerated well.

## 2023-04-29 ENCOUNTER — Other Ambulatory Visit: Payer: Self-pay

## 2023-04-29 ENCOUNTER — Other Ambulatory Visit: Payer: Self-pay | Admitting: Physician Assistant

## 2023-04-29 ENCOUNTER — Telehealth: Payer: Self-pay | Admitting: *Deleted

## 2023-04-29 ENCOUNTER — Telehealth: Payer: Self-pay

## 2023-04-29 ENCOUNTER — Encounter: Payer: Self-pay | Admitting: Internal Medicine

## 2023-04-29 DIAGNOSIS — R066 Hiccough: Secondary | ICD-10-CM

## 2023-04-29 MED ORDER — CHLORPROMAZINE HCL 25 MG PO TABS: 25.0000 mg | ORAL_TABLET | Freq: Three times a day (TID) | ORAL | 1 refills | Status: DC | PRN

## 2023-04-29 MED ORDER — ONDANSETRON HCL 8 MG PO TABS: 8.0000 mg | ORAL_TABLET | Freq: Three times a day (TID) | ORAL | 2 refills | Status: DC | PRN

## 2023-04-29 NOTE — Patient Outreach (Signed)
Care Management  Transitions of Care Program Transitions of Care Post-discharge Program Completion    04/29/2023 Name: William Mcguire MRN: [id number] DOB: Aug 12, 1955  Subjective: William Mcguire is a 67 y.o. year old male who is a primary care patient of [name], MD. The Care Management team Engaged with patient Engaged with patient by telephone to assess and address transitions of care needs.   Consent to Services:  Patient was given information about care management services, agreed to services, and gave verbal consent to participate.   Assessment:   Patient voices no new complaints Patient has not developed/ reported any new Medical issues / Dx or acute changes.- since last follow-up call for most recent  Hospital stay   9/25-10/1  / 2024  He is doing very well. He had initial Chemo tx and next is scheduled 3 weeks 05/25/23. He reports no significant side effects besides some belching. He is planning on  calling Oncologist today to discuss medication to ease this. His Metformin remains on hold BG testing runs 120s. He does state his port site remains slightly tender no s/s infection  Patient educated on red flags s/s to watch for and was encouraged to report any of these identified , any new symptoms , changes in baseline or  medication regimen,  change in health status  /  well-being, or safety concerns to PCP and / or the  [facility] Case Management team .          SDOH Interventions    Flowsheet Row Patient Outreach from 04/29/2023 in [place] POPULATION HEALTH DEPARTMENT Telephone from 03/26/2023 in [place] POPULATION HEALTH DEPARTMENT Admission (Discharged) from 03/18/2023 in [place] [facility] CV PROGRESSIVE CARE Clinical Support from 11/05/2022 in [facility] Chronic Care Management from 05/02/2020 in [facility] Chronic Care Management from 01/30/2020 in [facility]  SDOH  Interventions        Food Insecurity Interventions Intervention Not Indicated -- -- Intervention Not Indicated Intervention Not Indicated --  Housing Interventions Intervention Not Indicated Intervention Not Indicated Intervention Not Indicated Intervention Not Indicated Intervention Not Indicated --  Transportation Interventions Intervention Not Indicated, Patient Resources (Friends/Family) Intervention Not Indicated -- Intervention Not Indicated Intervention Not Indicated --  Utilities Interventions Intervention Not Indicated -- -- -- -- --  Financial Strain Interventions -- -- -- Intervention Not Indicated -- Other (Comment)  [Will continue to assist with [employer] and Hospital doctor patient assistance programs as needed]  Physical Activity Interventions -- -- -- Intervention Not Indicated -- --  Stress Interventions -- -- -- Intervention Not Indicated -- --  Health Literacy Interventions -- Intervention Not Indicated -- -- -- --        Goals Addressed             This Visit's Progress    COMPLETED: TOC CarePlan       Current Barriers:  Chronic Disease Management support and 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 / UTI, plan is to start   He has a prep class 04/22/23 for Radiation treatments ( total of 4 qoweek)- completed class 1st TX 11/4    RNCM Clinical Goal(s):  Patient will work with the Care Management team over the next 30 days to address Transition of Care Barriers: Medication Management Provider appointments take all medications exactly as prescribed and will call provider for medication related questions as evidenced by no missed medications, medications are taken as prescribed( dose, frequency)  attend all scheduled medical appointments: post surgical follow-up , PCP and Oncology if indicated as evidenced by no missed appointments   through collaboration with RN Care manager, provider, and care team.   Interventions: Evaluation of current treatment plan related to   self management and patient's adherence to plan as established by provider  Transitions of Care:  Goal Met. Doctor Visits  - discussed the importance of doctor visits,prep class  and  obtaining labwork   Patient Goals/Self-Care Activities: Participate in Transition of Care Program/Attend TOC scheduled calls Take all medications as prescribed Attend all scheduled provider appointments Call pharmacy for medication refills 3-7 days in advance of running out of medications Perform all self care activities independently  Perform IADL's (shopping, preparing meals, housekeeping, managing finances) independently Call provider office for new concerns or questions  work with the Care Management team over the next 30 days to address Transition of Care Barriers: Medication Management, post procedural follow-up and symptom management, to avoid hospital readmissions   Follow Up Plan:  Patient completed [facility] Program          Plan: The patient has been provided with contact information for the care management team and has been advised to call with any health related questions or concerns.  Patient has completed the [facility] Program He had no questions orr concerns  His spouse is a Engineer, civil (consulting) and he feels his POC is managed and did nit need additional follow-up    Susa Loffler , BSN, RN Care Management Coordinator [place]   [facility] [name] Direct Dial: [phone]

## 2023-04-29 NOTE — Telephone Encounter (Signed)
Notified Patient of prior authorization approval for Chlorpromazine 25 mg Tablets. Medication is approved through 04/28/2024. No other needs or concerns voiced at this time.

## 2023-04-29 NOTE — Telephone Encounter (Signed)
Pt left a message stating he had treatment on Monday. States he has "bad hiccups and I need a prescription for it"  Wife called 10 minutes later leaving a message aggressively stating the same thing.  RN spoke with [employer], PA. She is going to prescribe Thorazine for the hiccups and Zofran for nausea. Explained to patient that he will need to use the new nausea prescription instead of Compazine. Also reviewed a few other ways of relieving hiccups. Pt verbalized understanding

## 2023-05-03 NOTE — Progress Notes (Addendum)
[facility] OFFICE PROGRESS NOTE  [name], MD [address] [place] [address]  DIAGNOSIS: stage IIA (T2b, N0, M0) non-small cell lung cancer, adenocarcinoma. He presented with a left upper lobe lesion. He was diagnosed in 2024.   Molecular studies  no actionable mutations. Has KRAS G12V which is not actionable  PDL1: 6%  PRIOR THERAPY: He is status post surgical resection by Dr. Cliffton Asters on March 18, 2023.   CURRENT THERAPY: Adjuvant treatment with carboplatin and Alimta for 4 cycles. He is not a good candidate for cisplatin due to his CKD. He is status post 1 cycle. First dose on 10/212/24  INTERVAL HISTORY: William Mcguire 67 y.o. male returns to the clinic today for a follow-up visit accompanied by his wife. In summary the patient was recently diagnosed with lung cancer.  He is status post surgical resection and last week he underwent his first cycle of adjuvant chemotherapy.  He tolerated it well except for intractable hiccups for which she was prescribed Thorazine and some fatigue.  He was not effective for his hiccups.  The hiccups started the day following treatment and lasted until Friday.  He is neutropenic today.  Denies any signs and symptoms of infection.  Denies any fever, chills, night sweats.  It looks like he lost a few pounds but he reports that his appetite is okay.  He has some intermittent shortness of breath since having surgery but denies any changes.  His cough that occurred after surgery has since resolved.  Denies any chest pain or hemoptysis.  Denies any nausea or vomiting.  He had 1 episode of constipation last week for which he took MiraLAX.  He had 2 loose stools this week once a day.  His last bowel movement was 20 minutes ago and was of normal color and consistency.  He is compliant with his folic acid.  He is here today for evaluation of 1 week follow-up visit to manage any adverse side effects of treatment.    MEDICAL  HISTORY: Past Medical History:  Diagnosis Date   Diabetes mellitus    High cholesterol    Hypertension     ALLERGIES:  has No Known Allergies.  MEDICATIONS:  Current Outpatient Medications  Medication Sig Dispense Refill   chlorproMAZINE (THORAZINE) 25 MG tablet Take 1 tablet (25 mg total) by mouth 3 (three) times daily as needed. 45 tablet 1   ondansetron (ZOFRAN) 8 MG tablet Take 1 tablet (8 mg total) by mouth every 8 (eight) hours as needed for nausea or vomiting. Starting day 3 after chemotherapy 30 tablet 2   amLODipine (NORVASC) 10 MG tablet TAKE 1 TABLET BY MOUTH EVERY DAY 90 tablet 1   aspirin EC 81 MG tablet Take 81 mg by mouth daily. Swallow whole.     atorvastatin (LIPITOR) 40 MG tablet TAKE 1 TABLET BY MOUTH EVERY DAY 90 tablet 1   cyanocobalamin (VITAMIN B12) 1000 MCG/ML injection INJECT 1 ML (1,000 MCG TOTAL) INTO THE MUSCLE EVERY 30 DAYS. 9 mL 1   dexamethasone (DECADRON) 4 MG tablet Take 1 tablet twice a day the day before, the day of, and the day after chemotherapy 40 tablet 2   folic acid (FOLVITE) 1 MG tablet Take 1 tablet (1 mg total) by mouth daily. 30 tablet 2   glucose blood (ONETOUCH VERIO) test strip Use as instructed to check blood sugars twice daily E11.69 100 each 2   glucose blood test strip Use as instructed to test  blood sugar 3 times a day. Dx code: e11.65 100 each 2   insulin glargine (LANTUS) 100 UNIT/ML injection Inject 21 Units into the skin at bedtime.     oxyCODONE (OXY IR/ROXICODONE) 5 MG immediate release tablet Take 1 tablet (5 mg total) by mouth every 6 (six) hours as needed for moderate pain. 28 tablet 0   prochlorperazine (COMPAZINE) 10 MG tablet Take 1 tablet (10 mg total) by mouth every 6 (six) hours as needed. 30 tablet 2   sildenafil (VIAGRA) 100 MG tablet TAKE 1 TABLET BY MOUTH EVERY DAY AS NEEDED (INSURANCE COVERS 10 TAB PER 77DAYS) 10 tablet 5   SYRINGE-NEEDLE, DISP, 3 ML (B-D INTEGRA SYRINGE) 25G X 5/8" 3 ML MISC USE AS DIRECTED 12 each  24   No current facility-administered medications for this visit.    SURGICAL HISTORY:  Past Surgical History:  Procedure Laterality Date   BRONCHIAL BIOPSY  01/05/2023   Procedure: BRONCHIAL BIOPSIES;  Surgeon: Josephine Igo, DO;  Location: [facility];  Service: Pulmonary;;   BRONCHIAL BRUSHINGS  01/05/2023   Procedure: BRONCHIAL BRUSHINGS;  Surgeon: Josephine Igo, DO;  Location: [facility];  Service: Pulmonary;;   BRONCHIAL NEEDLE ASPIRATION BIOPSY  01/05/2023   Procedure: BRONCHIAL NEEDLE ASPIRATION BIOPSIES;  Surgeon: Josephine Igo, DO;  Location: [facility];  Service: Pulmonary;;   INTERCOSTAL NERVE BLOCK Left 03/18/2023   Procedure: INTERCOSTAL NERVE BLOCK;  Surgeon: Corliss Skains, MD;  Location: [facility] OR;  Service: Thoracic;  Laterality: Left;   KNEE SURGERY Left 2004   torn cartilage   LYMPH NODE BIOPSY Left 03/18/2023   Procedure: LYMPH NODE BIOPSY;  Surgeon: Corliss Skains, MD;  Location: [facility] OR;  Service: Thoracic;  Laterality: Left;   PATELLAR TENDON REPAIR  06/11/2011   Procedure: PATELLA TENDON REPAIR;  Surgeon: Cammy Copa;  Location: [facility];  Service: Orthopedics;  Laterality: Right;    REVIEW OF SYSTEMS:   Review of Systems  Constitutional: Positive for fatigue and weight loss. Negative for appetite change, chills, fever.  HENT: Negative for mouth sores, nosebleeds, sore throat and trouble swallowing.   Eyes: Negative for eye problems and icterus.  Respiratory: Stable occasional dyspnea.  Negative for cough, hemoptysis, and wheezing.   Cardiovascular: Negative for chest pain and leg swelling.  Gastrointestinal: Positive for 2 episode of diarrhea this week and 1 episode of constipation last week.  Negative for abdominal pain, nausea and vomiting.  Genitourinary: Negative for bladder incontinence, difficulty urinating, dysuria, frequency and hematuria.   Musculoskeletal: Negative for back pain, gait problem, neck pain and neck stiffness.   Skin: Negative for itching and rash.  Neurological: Negative for dizziness, extremity weakness, gait problem, headaches, light-headedness and seizures.  Hematological: Negative for adenopathy. Does not bruise/bleed easily.  Psychiatric/Behavioral: Negative for confusion, depression and sleep disturbance. The patient is not nervous/anxious.     PHYSICAL EXAMINATION:  There were no vitals taken for this visit.  ECOG PERFORMANCE STATUS: 1  Physical Exam  Constitutional: Oriented to person, place, and time and well-developed, well-nourished, and in no distress.  HENT:  Head: Normocephalic and atraumatic.  Mouth/Throat: Oropharynx is clear and moist. No oropharyngeal exudate.  Eyes: Conjunctivae are normal. Right eye exhibits no discharge. Left eye exhibits no discharge. No scleral icterus.  Neck: Normal range of motion. Neck supple.  Cardiovascular: Normal rate, regular rhythm, normal heart sounds and intact distal pulses.   Pulmonary/Chest: Effort normal and breath sounds normal. No respiratory distress. No wheezes. No rales.  Abdominal:  Soft. Bowel sounds are normal. Exhibits no distension and no mass. There is no tenderness.  Musculoskeletal: Normal range of motion. Exhibits no edema.  Lymphadenopathy:    No cervical adenopathy.  Neurological: Alert and oriented to person, place, and time. Exhibits normal muscle tone. Gait normal. Coordination normal.  Skin: Skin is warm and dry. No rash noted. Not diaphoretic. No erythema. No pallor.  Psychiatric: Mood, memory and judgment normal.  Vitals reviewed.  LABORATORY DATA: Lab Results  Component Value Date   WBC 9.6 04/27/2023   HGB 9.9 (L) 04/27/2023   HCT 29.4 (L) 04/27/2023   MCV 87.8 04/27/2023   PLT 219 04/27/2023      Chemistry      Component Value Date/Time   NA 133 (L) 04/27/2023 0914   NA 137 11/05/2022 1152   K 4.2 04/27/2023 0914   CL 104 04/27/2023 0914   CO2 25 04/27/2023 0914   BUN 15 04/27/2023 0914   BUN 11  11/05/2022 1152   CREATININE 1.32 (H) 04/27/2023 0914      Component Value Date/Time   CALCIUM 9.7 04/27/2023 0914   ALKPHOS 129 (H) 04/27/2023 0914   AST 22 04/27/2023 0914   ALT 18 04/27/2023 0914   BILITOT 0.4 04/27/2023 0914       RADIOGRAPHIC STUDIES:  No results found.   ASSESSMENT/PLAN:  This is a very pleasant 67 year old African American Male with stage IIA (T2b, N0, M0) non-small cell lung cancer, adenocarcinoma. He presented with a left upper lobe lesion. He was diagnosed in 2024. He is status post surgical resection by Dr. Cliffton Asters on March 18, 2023. Negative for any actionable mutations. PDL1 expression 6%.   He is currently undergoing adjuvant chemotherapy with carboplatin for an AUC of 5 and Alimta. He is not a good canidate for cisplatin due to his CKD. We need to monitor his alimta closely with his CKD. His first dose of treatment was on 04/13/23. He tolerated it well except for fatigue and hiccups  Labs were reviewed.  Total white blood cell count is 2.0.  His ANC is 0.6.  We will check with the authorization team and arrange for the patient to receive 2 daily doses of Zarxio. He will receive the first dose today. Neutropenic precautions were reviewed and he was advised to seek medical evaluation if he develops any signs and symptoms of infection and to ensure that he is avoiding large crowds, avoiding seafood, washing fruits and vegetables closely, etc.  His metformin was discontinued since he had surgery.  He is going to monitor his blood sugar closely at home and reach out to his PCP if his blood sugar is not at goal to see if he needs to restart metformin.  We will need to continue to monitor his anemia and creatinine closely. He had baseline anemia before starting chemotherapy and some CKD.   We will see him in 2 weeks before undergoing cycle #2.   We will need to monitor his anemia closely. He will continue on oral iron supplements for now. We would  consider him for a blood transfusion if his Hbg were <8.   Unclear if the hiccups are from his surgery with nerve irritation vs premedication with decadron.  Thorazine did not agree with him and made him more tired.  We will monitor with his next round of treatment for any hiccups.  The patient was advised to call immediately if he has any concerning symptoms in the interval. The patient voices  understanding of current disease status and treatment options and is in agreement with the current care plan. All questions were answered. The patient knows to call the clinic with any problems, questions or concerns. We can certainly see the patient much sooner if necessary      No orders of the defined types were placed in this encounter.   The total time spent in the appointment was 20-29 minutes  Aften Lipsey L Leaman Abe, PA-C 05/03/23

## 2023-05-04 ENCOUNTER — Other Ambulatory Visit: Payer: Medicare Other

## 2023-05-04 ENCOUNTER — Other Ambulatory Visit: Payer: Self-pay | Admitting: Thoracic Surgery (Cardiothoracic Vascular Surgery)

## 2023-05-04 DIAGNOSIS — C3492 Malignant neoplasm of unspecified part of left bronchus or lung: Secondary | ICD-10-CM

## 2023-05-05 ENCOUNTER — Ambulatory Visit (INDEPENDENT_AMBULATORY_CARE_PROVIDER_SITE_OTHER): Payer: Self-pay | Admitting: Surgical

## 2023-05-05 ENCOUNTER — Other Ambulatory Visit: Payer: Medicare Other

## 2023-05-05 ENCOUNTER — Ambulatory Visit
Admission: RE | Admit: 2023-05-05 | Discharge: 2023-05-05 | Disposition: A | Discharge status: Home / Self Care | Payer: Medicare Other | Source: Ambulatory Visit | Attending: Thoracic Surgery (Cardiothoracic Vascular Surgery) | Admitting: Thoracic Surgery (Cardiothoracic Vascular Surgery)

## 2023-05-05 VITALS — BP 140/72 | HR 83 | Resp 20 | Ht 75.0 in | Wt 239.0 lb

## 2023-05-05 DIAGNOSIS — Z09 Encounter for follow-up examination after completed treatment for conditions other than malignant neoplasm: Secondary | ICD-10-CM

## 2023-05-05 DIAGNOSIS — Z902 Acquired absence of lung [part of]: Secondary | ICD-10-CM | POA: Diagnosis not present

## 2023-05-05 DIAGNOSIS — R0602 Shortness of breath: Secondary | ICD-10-CM | POA: Diagnosis not present

## 2023-05-05 DIAGNOSIS — C3492 Malignant neoplasm of unspecified part of left bronchus or lung: Secondary | ICD-10-CM

## 2023-05-05 NOTE — Progress Notes (Signed)
[address]       [place] [address]             [phone]      Jessica Ostrand [facility] Medical Record #[id number] Date of Birth: [birth year]  Referring: Artis Delay, MD Primary Care: [name], MD Primary Cardiologist: None   Chief Complaint:   POST OP FOLLOW UP    03/18/2023   Patient:  William Mcguire Pre-Op Dx: Left upper lobe adenocarcinoma   Post-op Dx:  same Procedure: - Robotic assisted left video thoracoscopy - left upper lobectomy - Mediastinal lymph node sampling - Intercostal nerve block   Surgeon and Role:      * Lightfoot, Eliezer Lofts, MD - Primary   Assistant: B.Stehler, PA-C  History of Present Illness:    Patient is a 67 year old male seen in the office on today's date and routine postsurgical follow-up is post the above described procedure.  He describes some mild soreness at this time but is not taking any medication at all for this.  He is driving.  He has had no fevers, chills or other significant constitutional symptoms.  He has mild shortness of breath with activity at times and does tire easier than previously.  He has had no trouble with the incisions.  Activity progression is improving over time.  He has seen by oncology and has had his first chemotherapy treatment last week.     Patient Characteristics: Postoperative without Neoadjuvant Therapy (Pathologic Staging), Stage IB (Tumor Size = 4 cm) or Stage II / III, Adjuvant Chemotherapy Planned, Stage IB (Tumor Size = 4 cm), or Stage II / III, Nonsquamous Cell Therapeutic Status: Postoperative without Neoadjuvant Therapy (Pathologic Staging) AJCC T Category: pT2b AJCC N Category: pN0 AJCC M Category: cM0 AJCC 8 Stage Grouping: IIA Adjuvant Chemotherapy Status: Adjuvant Chemotherapy Planned Histology: Nonsquamous Cell   Past Medical History:  Diagnosis Date   Diabetes mellitus    High cholesterol    Hypertension      Social History   Tobacco Use  Smoking  Status Never  Smokeless Tobacco Never  Tobacco Comments   n/a    Social History   Substance and Sexual Activity  Alcohol Use No     No Known Allergies  Current Outpatient Medications  Medication Sig Dispense Refill   amLODipine (NORVASC) 10 MG tablet TAKE 1 TABLET BY MOUTH EVERY DAY 90 tablet 1   aspirin EC 81 MG tablet Take 81 mg by mouth daily. Swallow whole.     atorvastatin (LIPITOR) 40 MG tablet TAKE 1 TABLET BY MOUTH EVERY DAY 90 tablet 1   chlorproMAZINE (THORAZINE) 25 MG tablet Take 1 tablet (25 mg total) by mouth 3 (three) times daily as needed. 45 tablet 1   cyanocobalamin (VITAMIN B12) 1000 MCG/ML injection INJECT 1 ML (1,000 MCG TOTAL) INTO THE MUSCLE EVERY 30 DAYS. 9 mL 1   dexamethasone (DECADRON) 4 MG tablet Take 1 tablet twice a day the day before, the day of, and the day after chemotherapy 40 tablet 2   folic acid (FOLVITE) 1 MG tablet Take 1 tablet (1 mg total) by mouth daily. 30 tablet 2   glucose blood (ONETOUCH VERIO) test strip Use as instructed to check blood sugars twice daily E11.69 100 each 2   glucose blood test strip Use as instructed to test blood sugar 3 times a day. Dx code: e11.65 100 each 2   insulin glargine (LANTUS) 100 UNIT/ML injection Inject 21 Units into  the skin at bedtime.     ondansetron (ZOFRAN) 8 MG tablet Take 1 tablet (8 mg total) by mouth every 8 (eight) hours as needed for nausea or vomiting. Starting day 3 after chemotherapy 30 tablet 2   oxyCODONE (OXY IR/ROXICODONE) 5 MG immediate release tablet Take 1 tablet (5 mg total) by mouth every 6 (six) hours as needed for moderate pain. 28 tablet 0   prochlorperazine (COMPAZINE) 10 MG tablet Take 1 tablet (10 mg total) by mouth every 6 (six) hours as needed. 30 tablet 2   sildenafil (VIAGRA) 100 MG tablet TAKE 1 TABLET BY MOUTH EVERY DAY AS NEEDED (INSURANCE COVERS 10 TAB PER 77DAYS) 10 tablet 5   SYRINGE-NEEDLE, DISP, 3 ML (B-D INTEGRA SYRINGE) 25G X 5/8" 3 ML MISC USE AS DIRECTED 12 each 24    No current facility-administered medications for this visit.       Physical Exam: BP (!) 140/72   Pulse 83   Resp 20   Ht 6\' 3"  (1.905 m)   Wt 239 lb (108.4 kg)   SpO2 97% Comment: RA  BMI 29.87 kg/m   General appearance: alert, cooperative, and no distress Heart: regular rate and rhythm Lungs: clear to auscultation bilaterally Wound: Incisions all healing well without evidence of infection   Diagnostic Studies & Laboratory data:     Recent Radiology Findings:   No results found.    Recent Lab Findings: Lab Results  Component Value Date   WBC 9.6 04/27/2023   HGB 9.9 (L) 04/27/2023   HCT 29.4 (L) 04/27/2023   PLT 219 04/27/2023   GLUCOSE 283 (H) 04/27/2023   CHOL 139 08/18/2022   TRIG 77 08/18/2022   HDL 50 08/18/2022   LDLCALC 74 08/18/2022   ALT 18 04/27/2023   AST 22 04/27/2023   NA 133 (L) 04/27/2023   K 4.2 04/27/2023   CL 104 04/27/2023   CREATININE 1.32 (H) 04/27/2023   BUN 15 04/27/2023   CO2 25 04/27/2023   TSH 1.650 08/18/2022   INR 1.1 03/16/2023   HGBA1C 6.9 (H) 03/10/2023    Chest x-ray reviewed: May have a small left effusion, does not appear clinically significant.  Radiologist reading is pending.  Assessment / Plan: The patient is doing well in his postsurgical recovery.  He will continue chemotherapy and oncology plans as per Dr. Shirline Frees and his team.  There is no current surgical needs.  We will see the patient again on a as needed basis for any surgically related needs or at request.      Medication Changes: No orders of the defined types were placed in this encounter.     Rowe Clack, PA-C  05/05/2023 1:58 PM

## 2023-05-05 NOTE — Patient Instructions (Signed)
Progress activities per routine as tolerated

## 2023-05-06 ENCOUNTER — Inpatient Hospital Stay: Payer: Medicare Other

## 2023-05-06 ENCOUNTER — Other Ambulatory Visit: Payer: Self-pay | Admitting: Physician Assistant

## 2023-05-06 ENCOUNTER — Inpatient Hospital Stay: Payer: Medicare Other | Admitting: Physician Assistant

## 2023-05-06 ENCOUNTER — Encounter: Payer: Self-pay | Admitting: Internal Medicine

## 2023-05-06 VITALS — BP 128/61 | HR 88 | Temp 98.2°F | Resp 17 | Ht 75.0 in | Wt 239.0 lb

## 2023-05-06 DIAGNOSIS — C3492 Malignant neoplasm of unspecified part of left bronchus or lung: Secondary | ICD-10-CM | POA: Diagnosis not present

## 2023-05-06 DIAGNOSIS — D701 Agranulocytosis secondary to cancer chemotherapy: Secondary | ICD-10-CM

## 2023-05-06 DIAGNOSIS — Z5189 Encounter for other specified aftercare: Secondary | ICD-10-CM | POA: Diagnosis not present

## 2023-05-06 DIAGNOSIS — Z807 Family history of other malignant neoplasms of lymphoid, hematopoietic and related tissues: Secondary | ICD-10-CM | POA: Diagnosis not present

## 2023-05-06 DIAGNOSIS — Z5111 Encounter for antineoplastic chemotherapy: Secondary | ICD-10-CM | POA: Diagnosis not present

## 2023-05-06 DIAGNOSIS — D6959 Other secondary thrombocytopenia: Secondary | ICD-10-CM | POA: Diagnosis not present

## 2023-05-06 DIAGNOSIS — T451X5A Adverse effect of antineoplastic and immunosuppressive drugs, initial encounter: Secondary | ICD-10-CM

## 2023-05-06 DIAGNOSIS — C3412 Malignant neoplasm of upper lobe, left bronchus or lung: Secondary | ICD-10-CM | POA: Diagnosis not present

## 2023-05-06 DIAGNOSIS — D649 Anemia, unspecified: Secondary | ICD-10-CM

## 2023-05-06 DIAGNOSIS — D6481 Anemia due to antineoplastic chemotherapy: Secondary | ICD-10-CM | POA: Diagnosis not present

## 2023-05-06 DIAGNOSIS — N182 Chronic kidney disease, stage 2 (mild): Secondary | ICD-10-CM | POA: Diagnosis not present

## 2023-05-06 DIAGNOSIS — N3001 Acute cystitis with hematuria: Secondary | ICD-10-CM | POA: Diagnosis not present

## 2023-05-06 DIAGNOSIS — R197 Diarrhea, unspecified: Secondary | ICD-10-CM | POA: Diagnosis not present

## 2023-05-06 DIAGNOSIS — R066 Hiccough: Secondary | ICD-10-CM

## 2023-05-06 LAB — CMP (CANCER CENTER ONLY)
ALT: 25 U/L (ref 0–44)
AST: 25 U/L (ref 15–41)
Albumin: 3.4 g/dL — ABNORMAL LOW (ref 3.5–5.0)
Alkaline Phosphatase: 124 U/L (ref 38–126)
Anion gap: 2 — ABNORMAL LOW (ref 5–15)
BUN: 20 mg/dL (ref 8–23)
CO2: 30 mmol/L (ref 22–32)
Calcium: 9 mg/dL (ref 8.9–10.3)
Chloride: 99 mmol/L (ref 98–111)
Creatinine: 1.39 mg/dL — ABNORMAL HIGH (ref 0.61–1.24)
GFR, Estimated: 55 mL/min — ABNORMAL LOW (ref >60)
Glucose, Bld: 278 mg/dL — ABNORMAL HIGH (ref 70–99)
Potassium: 5 mmol/L (ref 3.5–5.1)
Sodium: 131 mmol/L — ABNORMAL LOW (ref 135–145)
Total Bilirubin: 0.4 mg/dL (ref <1.2)
Total Protein: 8.2 g/dL — ABNORMAL HIGH (ref 6.5–8.1)

## 2023-05-06 LAB — CBC WITH DIFFERENTIAL (CANCER CENTER ONLY)
Abs Immature Granulocytes: 0.02 10*3/uL (ref 0.00–0.07)
Basophils Absolute: 0 10*3/uL (ref 0.0–0.1)
Basophils Relative: 0 %
Eosinophils Absolute: 0.1 10*3/uL (ref 0.0–0.5)
Eosinophils Relative: 3 %
HCT: 28.4 % — ABNORMAL LOW (ref 39.0–52.0)
Hemoglobin: 9.6 g/dL — ABNORMAL LOW (ref 13.0–17.0)
Immature Granulocytes: 1 %
Lymphocytes Relative: 59 %
Lymphs Abs: 1.2 10*3/uL (ref 0.7–4.0)
MCH: 29.3 pg (ref 26.0–34.0)
MCHC: 33.8 g/dL (ref 30.0–36.0)
MCV: 86.6 fL (ref 80.0–100.0)
Monocytes Absolute: 0.2 10*3/uL (ref 0.1–1.0)
Monocytes Relative: 9 %
Neutro Abs: 0.6 10*3/uL — ABNORMAL LOW (ref 1.7–7.7)
Neutrophils Relative %: 28 %
Platelet Count: 112 10*3/uL — ABNORMAL LOW (ref 150–400)
RBC: 3.28 MIL/uL — ABNORMAL LOW (ref 4.22–5.81)
RDW: 13.2 % (ref 11.5–15.5)
WBC Count: 2 10*3/uL — ABNORMAL LOW (ref 4.0–10.5)
nRBC: 0 % (ref 0.0–0.2)

## 2023-05-06 LAB — SAMPLE TO BLOOD BANK

## 2023-05-06 MED ORDER — FILGRASTIM-SNDZ 480 MCG/0.8ML IJ SOSY
480.0000 ug | PREFILLED_SYRINGE | Freq: Once | INTRAMUSCULAR | Status: AC
  Administered 2023-05-06: 480 ug via SUBCUTANEOUS
  Filled 2023-05-06: qty 0.8

## 2023-05-06 MED ORDER — PROCHLORPERAZINE MALEATE 10 MG PO TABS: 10.0000 mg | ORAL_TABLET | Freq: Four times a day (QID) | ORAL | 1 refills | Status: DC | PRN

## 2023-05-07 ENCOUNTER — Inpatient Hospital Stay: Payer: Medicare Other

## 2023-05-07 DIAGNOSIS — D701 Agranulocytosis secondary to cancer chemotherapy: Secondary | ICD-10-CM | POA: Diagnosis not present

## 2023-05-07 DIAGNOSIS — N3001 Acute cystitis with hematuria: Secondary | ICD-10-CM | POA: Diagnosis not present

## 2023-05-07 DIAGNOSIS — N182 Chronic kidney disease, stage 2 (mild): Secondary | ICD-10-CM | POA: Diagnosis not present

## 2023-05-07 DIAGNOSIS — D6959 Other secondary thrombocytopenia: Secondary | ICD-10-CM | POA: Diagnosis not present

## 2023-05-07 DIAGNOSIS — Z5189 Encounter for other specified aftercare: Secondary | ICD-10-CM | POA: Diagnosis not present

## 2023-05-07 DIAGNOSIS — C3412 Malignant neoplasm of upper lobe, left bronchus or lung: Secondary | ICD-10-CM | POA: Diagnosis not present

## 2023-05-07 DIAGNOSIS — D6481 Anemia due to antineoplastic chemotherapy: Secondary | ICD-10-CM | POA: Diagnosis not present

## 2023-05-07 DIAGNOSIS — T451X5A Adverse effect of antineoplastic and immunosuppressive drugs, initial encounter: Secondary | ICD-10-CM

## 2023-05-07 DIAGNOSIS — Z807 Family history of other malignant neoplasms of lymphoid, hematopoietic and related tissues: Secondary | ICD-10-CM | POA: Diagnosis not present

## 2023-05-07 DIAGNOSIS — Z5111 Encounter for antineoplastic chemotherapy: Secondary | ICD-10-CM | POA: Diagnosis not present

## 2023-05-07 DIAGNOSIS — R197 Diarrhea, unspecified: Secondary | ICD-10-CM | POA: Diagnosis not present

## 2023-05-07 MED ORDER — FILGRASTIM-SNDZ 480 MCG/0.8ML IJ SOSY
480.0000 ug | PREFILLED_SYRINGE | Freq: Once | INTRAMUSCULAR | Status: AC
  Administered 2023-05-07: 480 ug via SUBCUTANEOUS
  Filled 2023-05-07: qty 0.8

## 2023-05-08 ENCOUNTER — Inpatient Hospital Stay: Payer: Medicare Other

## 2023-05-08 ENCOUNTER — Encounter: Payer: Self-pay | Admitting: Internal Medicine

## 2023-05-08 ENCOUNTER — Encounter: Payer: Self-pay | Admitting: Physician Assistant

## 2023-05-08 ENCOUNTER — Inpatient Hospital Stay (HOSPITAL_BASED_OUTPATIENT_CLINIC_OR_DEPARTMENT_OTHER): Payer: Medicare Other | Admitting: Physician Assistant

## 2023-05-08 ENCOUNTER — Telehealth: Payer: Self-pay | Admitting: Medical Oncology

## 2023-05-08 ENCOUNTER — Other Ambulatory Visit: Payer: Self-pay

## 2023-05-08 VITALS — BP 130/66 | HR 78 | Temp 98.3°F | Resp 20 | Wt 241.5 lb

## 2023-05-08 DIAGNOSIS — C3492 Malignant neoplasm of unspecified part of left bronchus or lung: Secondary | ICD-10-CM

## 2023-05-08 DIAGNOSIS — Z5189 Encounter for other specified aftercare: Secondary | ICD-10-CM | POA: Diagnosis not present

## 2023-05-08 DIAGNOSIS — Z5111 Encounter for antineoplastic chemotherapy: Secondary | ICD-10-CM | POA: Diagnosis not present

## 2023-05-08 DIAGNOSIS — D701 Agranulocytosis secondary to cancer chemotherapy: Secondary | ICD-10-CM

## 2023-05-08 DIAGNOSIS — D6959 Other secondary thrombocytopenia: Secondary | ICD-10-CM | POA: Diagnosis not present

## 2023-05-08 DIAGNOSIS — N390 Urinary tract infection, site not specified: Secondary | ICD-10-CM

## 2023-05-08 DIAGNOSIS — Z807 Family history of other malignant neoplasms of lymphoid, hematopoietic and related tissues: Secondary | ICD-10-CM | POA: Diagnosis not present

## 2023-05-08 DIAGNOSIS — C3412 Malignant neoplasm of upper lobe, left bronchus or lung: Secondary | ICD-10-CM

## 2023-05-08 DIAGNOSIS — D6481 Anemia due to antineoplastic chemotherapy: Secondary | ICD-10-CM | POA: Diagnosis not present

## 2023-05-08 DIAGNOSIS — N3001 Acute cystitis with hematuria: Secondary | ICD-10-CM

## 2023-05-08 DIAGNOSIS — D696 Thrombocytopenia, unspecified: Secondary | ICD-10-CM

## 2023-05-08 DIAGNOSIS — N182 Chronic kidney disease, stage 2 (mild): Secondary | ICD-10-CM | POA: Diagnosis not present

## 2023-05-08 DIAGNOSIS — R197 Diarrhea, unspecified: Secondary | ICD-10-CM | POA: Diagnosis not present

## 2023-05-08 DIAGNOSIS — T451X5A Adverse effect of antineoplastic and immunosuppressive drugs, initial encounter: Secondary | ICD-10-CM | POA: Diagnosis not present

## 2023-05-08 DIAGNOSIS — D649 Anemia, unspecified: Secondary | ICD-10-CM

## 2023-05-08 LAB — CBC WITH DIFFERENTIAL (CANCER CENTER ONLY)
Abs Immature Granulocytes: 0.39 10*3/uL — ABNORMAL HIGH (ref 0.00–0.07)
Basophils Absolute: 0 10*3/uL (ref 0.0–0.1)
Basophils Relative: 0 %
Eosinophils Absolute: 0 10*3/uL (ref 0.0–0.5)
Eosinophils Relative: 0 %
HCT: 25.9 % — ABNORMAL LOW (ref 39.0–52.0)
Hemoglobin: 8.6 g/dL — ABNORMAL LOW (ref 13.0–17.0)
Immature Granulocytes: 9 %
Lymphocytes Relative: 26 %
Lymphs Abs: 1.2 10*3/uL (ref 0.7–4.0)
MCH: 29 pg (ref 26.0–34.0)
MCHC: 33.2 g/dL (ref 30.0–36.0)
MCV: 87.2 fL (ref 80.0–100.0)
Monocytes Absolute: 0.4 10*3/uL (ref 0.1–1.0)
Monocytes Relative: 8 %
Neutro Abs: 2.6 10*3/uL (ref 1.7–7.7)
Neutrophils Relative %: 57 %
Platelet Count: 92 10*3/uL — ABNORMAL LOW (ref 150–400)
RBC: 2.97 MIL/uL — ABNORMAL LOW (ref 4.22–5.81)
RDW: 13.4 % (ref 11.5–15.5)
Smear Review: NORMAL
WBC Count: 4.5 10*3/uL (ref 4.0–10.5)
nRBC: 0 % (ref 0.0–0.2)

## 2023-05-08 LAB — COMPREHENSIVE METABOLIC PANEL
ALT: 23 U/L (ref 0–44)
AST: 23 U/L (ref 15–41)
Albumin: 3.5 g/dL (ref 3.5–5.0)
Alkaline Phosphatase: 133 U/L — ABNORMAL HIGH (ref 38–126)
Anion gap: 2 — ABNORMAL LOW (ref 5–15)
BUN: 17 mg/dL (ref 8–23)
CO2: 29 mmol/L (ref 22–32)
Calcium: 9.3 mg/dL (ref 8.9–10.3)
Chloride: 104 mmol/L (ref 98–111)
Creatinine, Ser: 1.6 mg/dL — ABNORMAL HIGH (ref 0.61–1.24)
GFR, Estimated: 46 mL/min — ABNORMAL LOW (ref >60)
Glucose, Bld: 139 mg/dL — ABNORMAL HIGH (ref 70–99)
Potassium: 4.4 mmol/L (ref 3.5–5.1)
Sodium: 135 mmol/L (ref 135–145)
Total Bilirubin: 0.4 mg/dL (ref <1.2)
Total Protein: 8.6 g/dL — ABNORMAL HIGH (ref 6.5–8.1)

## 2023-05-08 LAB — URINALYSIS, ROUTINE W REFLEX MICROSCOPIC
Bacteria, UA: NONE SEEN
Bilirubin Urine: NEGATIVE
Glucose, UA: NEGATIVE mg/dL
Ketones, ur: NEGATIVE mg/dL
Nitrite: NEGATIVE
Protein, ur: NEGATIVE mg/dL
Specific Gravity, Urine: 1.015 (ref 1.005–1.030)
pH: 6 (ref 5.0–8.0)

## 2023-05-08 LAB — SAMPLE TO BLOOD BANK

## 2023-05-08 MED ORDER — CEPHALEXIN 500 MG PO CAPS: 500.0000 mg | ORAL_CAPSULE | Freq: Two times a day (BID) | ORAL | 0 refills | Status: AC

## 2023-05-08 NOTE — Telephone Encounter (Signed)
2 days ago.started having  "Blood in underwear after urinating "- The episodes are sporadic and color may be mild red or bright red like yesterday. He has stinging after voiding.Denies back pain, fever, light headed ,dizzy.

## 2023-05-08 NOTE — Progress Notes (Signed)
No need for third zarxio injection per [place], PA

## 2023-05-08 NOTE — Patient Instructions (Signed)
Antibiotic will be sent to the pharmacy once I have the results from your urinalysis.

## 2023-05-08 NOTE — Progress Notes (Signed)
Symptom Management Consult Note [facility]    Patient Care Team: [name], MD as PCP - General (Internal Medicine) Lytle Butte, RN as Oncology Nurse Navigator    Name / MRN / DOB: William Mcguire  [id number]  Dec 16, 1955   Date of visit: 05/08/2023   Chief Complaint/Reason for visit: dysuria   Current Therapy: Carboplatin and pemetrexed with zarxio  Last treatment:  Day 1   Cycle 1 on 04/27/23 with G-CSF on 05/07/23   ASSESSMENT & PLAN: Patient is a 67 y.o. male with oncologic history of adenocarcinoma of left lung followed by Dr. Arbutus Ped.  I have viewed most recent oncology note and lab work.    #Adenocarcinoma of left lung - Tolerated chemo well overall. Diarrhea managed with Imodium - Next appointment with oncologist is 05/19/23   #Dysuria -Chart review shows patient was admitted to the hospital 10/3-10/05/24 for sepsis secondary to gram-negative UTI.  He was treated with Rocephin while inpatient and then discharged on 5 days of Omnicef. -UA today trace leukocytes. As he is symptomatic will treat for UTI. Will follow up on urine culture sent. Prescription for Keflex sent to pharmacy. Chose to avoid antibiotic that would increase creatinine like Macrobid or Bactrim.  #Chemotherapy-Induced Anemia -Hemoglobin decreased to 8.6, likely due to chemotherapy. No transfusion needed unless hemoglobin drops below 8. Discussed chemotherapy-related hemoglobin fluctuations; current level acceptable. - Monitor hemoglobin levels. Patient knows to RTC if gross hematuria persists   #Chemotherapy-Induced Thrombocytopenia -Platelets low 92k due to chemotherapy. Discussed bleeding precautions. - Monitor platelet levels   Chronic Kidney disease -Creatinine slightly elevated at 1.60 compared to labs x 2 days ago when it was 1.39. - Encourage continued hydration. Patient prefers to drink fluids at home and avoid IVf if possible.   Strict ED precautions  discussed should symptoms worsen.   Heme/Onc History: Oncology History Overview Note  Normal cytogenetics   Adenocarcinoma of left lung (HCC)  10/14/2020 Initial Diagnosis   Adenocarcinoma of left lung (HCC)   02/17/2023 Cancer Staging   Staging form: Lung, AJCC 8th Edition - Clinical stage from 02/17/2023: Stage IIA (cT2b, cN0, cM0) - Signed by Artis Delay, MD on 02/17/2023 Stage prefix: Initial diagnosis   03/18/2023 Pathology Results   SURGICAL PATHOLOGY CASE: [id number] PATIENT: [name] Surgical Pathology Report  Clinical History: lung cancer (cm)  FINAL MICROSCOPIC DIAGNOSIS:  A. LYMPH NODE, LEVEL 9, EXCISION:      One lymph node, negative for metastatic carcinoma (0/1).  B. LYMPH NODE, HILAR, EXCISION:      One lymph node, negative for metastatic carcinoma (0/1).  C. LYMPH NODE, LEVEL 6, EXCISION:      One lymph node, negative for metastatic carcinoma (0/1).  D. LYMPH NODE, LEVEL 6 #2, EXCISION:      One lymph node, negative for metastatic carcinoma (0/1).  E. LYMPH NODE, LEVEL 5, EXCISION:      One lymph node, negative for metastatic carcinoma (0/1).  F. LYMPH NODE, HILAR #2, EXCISION:      One lymph node, negative for metastatic carcinoma (0/1).  G. LUNG, LEFT UPPER LOBE, LOBECTOMY: Invasive mucinous adenocarcinoma with papillary features. Tumor size: 5.0 cm. Spread through air space is identified. Visceral pleural invasion is not identified. Bronchial margin and vascular margin are negative for carcinoma. Four lymph nodes, negative for metastatic carcinoma (0/4). Focus of benign subpleural organizing fibrous nodule with reactive pneumocytes (2 mm). See oncology table.  ONCOLOGY TABLE:  LUNG: Resection  Synchronous Tumors: Not applicable  Total Number of Primary Tumors: 1 Procedure: Lobectomy Specimen Laterality: Left Tumor Focality: Unifocal Tumor Site: Left upper lobe Tumor Size: 5.0 cm      Total Tumor Size: 5.0 cm      Invasive Tumor  Size (applies only to invasive nonmucinous adenocarcinoma with a lepidic           component): NA Histologic Type: Invasive mucinous adenocarcinoma Visceral Pleura Invasion: Not identified Direct Invasion of Adjacent Structures: No adjacent structures present Lymphovascular Invasion: Not identified Margins: All margins negative for invasive carcinoma      Closest Margin(s) to Invasive Carcinoma: 0.8 cm from the closest bronchial margin      Margin(s) Involved by Invasive Carcinoma: Not applicable       Margin Status for Non-Invasive Tumor: Not applicable Treatment Effect: No known presurgical therapy      Percentage of Residual Viable Tumor: NA Regional Lymph Nodes:      Number of Lymph Nodes Involved: 0                           Nodal Sites with Tumor: NA      Number of Lymph Nodes Examined: 10                      Nodal Sites Examined: Lever 2, 5, 6, 9, hilar Distant Metastasis:      Distant Site(s) Involved: Not applicable Pathologic Stage Classification (pTNM, AJCC 8th Edition): pT2b, pN0 Ancillary Studies: Can be performed upon request Representative Tumor Block: G3, G4, G5 Comment(s): None ([id number])    03/18/2023 Surgery   03/18/2023   Patient:  [name] Pre-Op Dx: Left upper lobe adenocarcinoma   Post-op Dx:  same Procedure: - Robotic assisted left video thoracoscopy - left upper lobectomy - Mediastinal lymph node sampling - Intercostal nerve block   Surgeon and Role:      * Lightfoot, Eliezer Lofts, MD - Primary   Assistant: B.Stehler, PA-C  An experienced assistant was required given the complexity of this surgery and the standard of surgical care. The assistant was needed for exposure, dissection, suctioning, retraction of delicate tissues and sutures, instrument exchange and for overall help during this procedure.     Anesthesia  general EBL:  200 ml Blood Administration: none Specimen:  left upper lobe, hilar and mediastinal nodes   Drains: 28 F  argyle chest tube in left chest Counts: correct     Indications: 67yo male with left upper lobe 4cm pulmonary mass.  Previous biopsy has been negative.  He had a second biopsy performed at [facility] was positive for mucinous adenocarcinoma.  He was originally scheduled to have surgery at [facility], but this physician is not in network.    Of note on the PET scan there is another hypermetabolic area in the left lower lobe but this does not correspond with the specific pulmonary nodule.  I would like to obtain a repeat CT chest for further evaluation of this left lower lobe lesion, as well as an MRI brain given the size of the original tumor.  If both of these are negative then he is agreeable to proceed with a left robotic assisted thoracoscopy with left upper lobectomy.  The risks and benefits have been discussed.    Findings: Enlarged lymph nodes   Operative Technique: After the risks, benefits and alternatives were thoroughly discussed, the patient was brought to the operative theatre.  Anesthesia was induced, and the patient was  then placed in a lateral decubitus position and was prepped and draped in normal sterile fashion.  An appropriate surgical pause was performed, and pre-operative antibiotics were dosed accordingly.   We began by placing our 4 robotic ports in the the 7th intercostal space targeting the hilum of the lung.  A 12mm assistant port was placed in the 9th intercostal space in the anterior axillary line.  The robot was then docked and all instruments were passed under direct visualization.    The lung was then retracted superiorly, and the inferior pulmonary ligament was divided.  The hilum was mobilized anteriorly and posteriorly.  We identified the upper lobe pulmonary vein, and after careful isolation, it was divided with a vascular stapler.  We next moved to the pulmonary artery.  The artery was then divided with a vascular load stapler.  The bronchus to the upper lobe was then isolated.   After a test clamp, with good ventilation of the remaining lung, the bronchus was then divided.  The fissure was completed, and the specimen was passed into an endocatch bag.  It was removed from the anterior access site.     Lymph nodes were then sampled at hilum and mediastinum.  The chest was irrigated, and an air leak test was performed.  An intercostal nerve block was performed under direct visualization.  A 28 F chest tube was then placed, and we watch the remaining lobes re-expand.  The skin and soft tissue were closed with absorbable suture     The patient tolerated the procedure without any immediate complications, and was transferred to the PACU in stable condition.   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 No lytic or destructive lesions within the bones. 2. 2.9 cm nodular opacity in the hilar region on the left, unchanged from recent CT. 3. Scattered degenerative changes.   06/02/2022 Bone Marrow Biopsy   BONE MARROW, ASPIRATE, CLOT, CORE: -Variably cellular bone marrow involved by plasma cell neoplasm (10% plasma cells by manual aspirate differential, approximately 10 to 15% by CD138 immunohistochemical stains on clot and core sections, and kappa restricted by kappa/lambda in situ hybridization.).  See comment.  PERIPHERAL BLOOD: -Normocytic normochromic anemia  COMMENT:   Morphological evaluation of the bone marrow clot sections reveals multiple lymphoid aggregates in addition to increased plasma cells.  CD3 and CD20 stains are attempted, however the lymphoid aggregates are lost on deeper levels with a single lymphoid aggregate showing slightly more CD20 staining than CD3.  While flow cytometry is negative for a clonal B-cell population, close clinical and imaging follow-up is recommended along with assessment of peripheral blood and  potential sites of lymphadenopathy/organomegaly as clinically indicated.   MICROSCOPIC DESCRIPTION:  PERIPHERAL BLOOD SMEAR: Platelets: Adequate, no platelet clumps identified Erythroid: Normocytic normochromic anemia Leukocytes: Adequate, negative for dysplastic granulocytes, blasts or plasma cells.  Plasmacytoid lymphocytes present.  BONE MARROW ASPIRATE: Cellular Erythroid precursors: Erythroid precursors show a full sequence of generally orderly maturation Granulocytic precursors: Granulocytic precursors show full sequence of generally orderly maturation Megakaryocytes: Typical in number and morphology Lymphocytes/plasma cells: Plasma cells are increased  TOUCH PREPARATIONS: Confirmatory of the aspirate findings  CLOT AND BIOPSY: The bone marrow clot and biopsy reveal a variably cellular bone marrow (20 to 60%) with trilineage hematopoiesis.  Plasma cells are increased and present  singly scattered and in clusters.  Also present are loosely circumscribed lymphoid aggregates with small mature lymphocytes.  The findings are confirmatory of the aspirate and touch prep impression.  SPECIAL stains: CD138: CD138 highlights plasma cells, approximately 10% of cellularity in clot and core sections Kappa/lambda ISH: Kappa/lambda ISH shows kappa restricted plasma cells CD3: CD3 highlights T lymphocytes in the lymphoid aggregates CD20: CD20 highlights B lymphocytes in lymphoid aggregates (slightly more than CD3) CD56: CD56 is positive in the plasma cells MUM 1: MUM1 is positive in the plasma cells IRON STAIN: Iron stains are performed on a bone marrow aspirate or touch imprint smear and section of clot. The controls stained appropriately.       Storage Iron: Scant      Ring Sideroblasts: Not identified  ADDITIONAL DATA/TESTING: Multiple myeloma panel  CELL COUNT DATA:  Bone Marrow count performed on 500 cells shows: Blasts:   0%   Myeloid:  43% Promyelocytes: 0%   Erythroid:      37% Myelocytes:    5%   Lymphocytes:   7% Metamyelocytes:     2%   Plasma cells:  10% Bands:    4% Neutrophils:   30%  M:E ratio:     1.16 Eosinophils:   2% Basophils:     0% Monocytes:     3%        Interval history-: Discussed the use of AI scribe software for clinical note transcription with the patient, who gave verbal consent to proceed.   Jadien Cugini is a 67 y.o. male with oncologic history as above presenting to [facility] today with chief complaint of dysuria. He is accompanied by spouse who provides additional history.   Patient reports urinary symptoms x4 days. He reported a discharge of blood after urination, which was not painful but described as a burning sensation.  He further describes the bleeding as noticing it in his underwear. The bleeding was intermittent, occurring only after some urination episodes.Denies any brisk bleeding or clots. His symptoms have actually started to improve today he reports.  The patient denied any associated fever, chills, back pain, nausea, or vomiting. The patient had a previous hospitalization for a UTI in October, which was treated with antibiotics. The symptoms during that episode were different and more severe, including fever. The patient felt that the UTI had resolved after the course of antibiotics. Spouse adds that the UTI was caused by his foley catheter that he had after surgery.  The patient also reported some side effects from recent chemotherapy, including diarrhea which is being managed with Imodium. Despite these side effects, the patient felt they were tolerating the chemotherapy well. The patient also reported increased urination frequency, which they attributed to increased water intake as advised for their chemotherapy. He is drinking at least 64 oz of water daily.   ROS  All other systems are reviewed and are negative for acute change except as noted in the HPI.    No Known Allergies   Past Medical History:  Diagnosis  Date   Diabetes mellitus    High cholesterol    Hypertension      Past Surgical History:  Procedure Laterality Date   BRONCHIAL BIOPSY  01/05/2023   Procedure: BRONCHIAL BIOPSIES;  Surgeon: Josephine Igo, DO;  Location: [facility]OSCOPY;  Service: Pulmonary;;   BRONCHIAL BRUSHINGS  01/05/2023   Procedure: BRONCHIAL BRUSHINGS;  Surgeon: Josephine Igo, DO;  Location: [facility];  Service: Pulmonary;;   BRONCHIAL NEEDLE ASPIRATION BIOPSY  01/05/2023   Procedure: BRONCHIAL NEEDLE ASPIRATION BIOPSIES;  Surgeon: Josephine Igo, DO;  Location: [facility] ENDOSCOPY;  Service: Pulmonary;;   INTERCOSTAL NERVE BLOCK Left 03/18/2023   Procedure: INTERCOSTAL NERVE BLOCK;  Surgeon: Corliss Skains, MD;  Location: [facility] OR;  Service: Thoracic;  Laterality: Left;   KNEE SURGERY Left 2004   torn cartilage   LYMPH NODE BIOPSY Left 03/18/2023   Procedure: LYMPH NODE BIOPSY;  Surgeon: Corliss Skains, MD;  Location: [facility] OR;  Service: Thoracic;  Laterality: Left;   PATELLAR TENDON REPAIR  06/11/2011   Procedure: PATELLA TENDON REPAIR;  Surgeon: Cammy Copa;  Location: [facility] ORS;  Service: Orthopedics;  Laterality: Right;    Social History   Socioeconomic History   Marital status: Married    Spouse name: Not on file   Number of children: Not on file   Years of education: Not on file   Highest education level: Not on file  Occupational History   Not on file  Tobacco Use   Smoking status: Never   Smokeless tobacco: Never   Tobacco comments:    n/a  Vaping Use   Vaping status: Never Used  Substance and Sexual Activity   Alcohol use: No   Drug use: No   Sexual activity: Not on file  Other Topics Concern   Not on file  Social History Narrative   Not on file   Social Determinants of Health   Financial Resource Strain: Low Risk  (11/05/2022)   Overall Financial Resource Strain (CARDIA)    Difficulty of Paying Living Expenses: Not hard at all  Food Insecurity: No Food Insecurity  (04/29/2023)   Hunger Vital Sign    Worried About Running Out of Food in the Last Year: Never true    Ran Out of Food in the Last Year: Never true  Transportation Needs: No Transportation Needs (04/29/2023)   PRAPARE - Administrator, Civil Service (Medical): No    Lack of Transportation (Non-Medical): No  Physical Activity: Sufficiently Active (11/05/2022)   Exercise Vital Sign    Days of Exercise per Week: 7 days    Minutes of Exercise per Session: 60 min  Stress: No Stress Concern Present (11/05/2022)   [employer] of Occupational Health - Occupational Stress Questionnaire    Feeling of Stress : Not at all  Social Connections: Not on file  Intimate Partner Violence: Not At Risk (04/29/2023)   Humiliation, Afraid, Rape, and Kick questionnaire    Fear of Current or Ex-Partner: No    Emotionally Abused: No    Physically Abused: No    Sexually Abused: No    Family History  Problem Relation Age of Onset   Hypertension Mother    Multiple myeloma Mother    Hypertension Father    Diabetes Father      Current Outpatient Medications:    cephALEXin (KEFLEX) 500 MG capsule, Take 1 capsule (500 mg total) by mouth 2 (two) times daily for 7 days., Disp: 14 capsule, Rfl: 0   amLODipine (NORVASC) 10 MG tablet, TAKE 1 TABLET BY MOUTH EVERY DAY, Disp: 90 tablet, Rfl: 1   aspirin EC 81 MG tablet, Take 81 mg by mouth daily. Swallow whole., Disp: , Rfl:    atorvastatin (LIPITOR) 40 MG tablet, TAKE 1 TABLET BY MOUTH EVERY DAY, Disp: 90 tablet, Rfl: 1   chlorproMAZINE (THORAZINE) 25 MG tablet, Take 1 tablet (25 mg total) by mouth 3 (three) times daily as needed., Disp: 45 tablet,  Rfl: 1   cyanocobalamin (VITAMIN B12) 1000 MCG/ML injection, INJECT 1 ML (1,000 MCG TOTAL) INTO THE MUSCLE EVERY 30 DAYS., Disp: 9 mL, Rfl: 1   dexamethasone (DECADRON) 4 MG tablet, Take 1 tablet twice a day the day before, the day of, and the day after chemotherapy, Disp: 40 tablet, Rfl: 2   folic acid  (FOLVITE) 1 MG tablet, Take 1 tablet (1 mg total) by mouth daily., Disp: 30 tablet, Rfl: 2   glucose blood (ONETOUCH VERIO) test strip, Use as instructed to check blood sugars twice daily E11.69, Disp: 100 each, Rfl: 2   glucose blood test strip, Use as instructed to test blood sugar 3 times a day. Dx code: e11.65, Disp: 100 each, Rfl: 2   insulin glargine (LANTUS) 100 UNIT/ML injection, Inject 21 Units into the skin at bedtime., Disp: , Rfl:    ondansetron (ZOFRAN) 8 MG tablet, Take 1 tablet (8 mg total) by mouth every 8 (eight) hours as needed for nausea or vomiting. Starting day 3 after chemotherapy, Disp: 30 tablet, Rfl: 2   oxyCODONE (OXY IR/ROXICODONE) 5 MG immediate release tablet, Take 1 tablet (5 mg total) by mouth every 6 (six) hours as needed for moderate pain., Disp: 28 tablet, Rfl: 0   prochlorperazine (COMPAZINE) 10 MG tablet, Take 1 tablet (10 mg total) by mouth every 6 (six) hours as needed., Disp: 270 tablet, Rfl: 1   sildenafil (VIAGRA) 100 MG tablet, TAKE 1 TABLET BY MOUTH EVERY DAY AS NEEDED (INSURANCE COVERS 10 TAB PER 77DAYS), Disp: 10 tablet, Rfl: 5   SYRINGE-NEEDLE, DISP, 3 ML (B-D INTEGRA SYRINGE) 25G X 5/8" 3 ML MISC, USE AS DIRECTED, Disp: 12 each, Rfl: 24  PHYSICAL EXAM: ECOG FS:1 - Symptomatic but completely ambulatory    Vitals:   05/08/23 1330  BP: 130/66  Pulse: 78  Resp: 20  Temp: 98.3 F (36.8 C)  SpO2: 100%  Weight: 241 lb 8 oz (109.5 kg)   Physical Exam Vitals and nursing note reviewed.  Constitutional:      Appearance: He is not ill-appearing or toxic-appearing.  HENT:     Head: Normocephalic.     Mouth/Throat:     Mouth: Mucous membranes are moist.  Eyes:     Conjunctiva/sclera: Conjunctivae normal.  Cardiovascular:     Rate and Rhythm: Normal rate and regular rhythm.     Pulses: Normal pulses.     Heart sounds: Normal heart sounds.  Pulmonary:     Effort: Pulmonary effort is normal.     Breath sounds: Normal breath sounds.  Abdominal:      General: There is no distension.     Palpations: Abdomen is soft.     Tenderness: There is no abdominal tenderness. There is no right CVA tenderness, left CVA tenderness or guarding.  Musculoskeletal:     Cervical back: Normal range of motion.  Skin:    General: Skin is warm and dry.  Neurological:     Mental Status: He is alert.        LABORATORY DATA: I have reviewed the data as listed    Latest Ref Rng & Units 05/08/2023    1:02 PM 05/06/2023   10:55 AM 04/27/2023    9:14 AM  CBC  WBC 4.0 - 10.5 K/uL 4.5  2.0  9.6   Hemoglobin 13.0 - 17.0 g/dL 8.6  9.6  9.9   Hematocrit 39.0 - 52.0 % 25.9  28.4  29.4   Platelets 150 - 400 K/uL 92  112  219         Latest Ref Rng & Units 05/08/2023    1:47 PM 05/06/2023   10:55 AM 04/27/2023    9:14 AM  CMP  Glucose 70 - 99 mg/dL 161  096  045   BUN 8 - 23 mg/dL 17  20  15    Creatinine 0.61 - 1.24 mg/dL 4.09  8.11  9.14   Sodium 135 - 145 mmol/L 135  131  133   Potassium 3.5 - 5.1 mmol/L 4.4  5.0  4.2   Chloride 98 - 111 mmol/L 104  99  104   CO2 22 - 32 mmol/L 29  30  25    Calcium 8.9 - 10.3 mg/dL 9.3  9.0  9.7   Total Protein 6.5 - 8.1 g/dL 8.6  8.2  9.0   Total Bilirubin <1.2 mg/dL 0.4  0.4  0.4   Alkaline Phos 38 - 126 U/L 133  124  129   AST 15 - 41 U/L 23  25  22    ALT 0 - 44 U/L 23  25  18         RADIOGRAPHIC STUDIES (from last 24 hours if applicable) I have personally reviewed the radiological images as listed and agreed with the findings in the report. No results found.      Visit Diagnosis: 1. Adenocarcinoma of left lung (HCC)   2. Acute cystitis with hematuria   3. Chronic renal disease, stage II   4. Chemotherapy induced neutropenia (HCC)   5. Thrombocytopenia (HCC)      No orders of the defined types were placed in this encounter.   All questions were answered. The patient knows to call the clinic with any problems, questions or concerns. No barriers to learning was detected.  A total of more  than 30 minutes were spent on this encounter with face-to-face time and non-face-to-face time, including preparing to see the patient, ordering tests and/or medications, counseling the patient and coordination of care as outlined above.    Thank you for allowing me to participate in the care of this patient.    Shanon Ace, PA-C Department of Hematology/Oncology [facility] at [facility] Phone: [phone]  [phone]    05/08/2023 3:19 PM

## 2023-05-08 NOTE — Telephone Encounter (Addendum)
Pt instructed to arrive at 1300 for labs and then a 1330 appt in  [facility]

## 2023-05-10 LAB — URINE CULTURE: Culture: 20000 — AB

## 2023-05-11 ENCOUNTER — Inpatient Hospital Stay: Payer: Medicare Other

## 2023-05-11 ENCOUNTER — Encounter: Payer: Medicare Other | Admitting: Physician Assistant

## 2023-05-14 NOTE — Progress Notes (Signed)
[facility] OFFICE PROGRESS NOTE  [name], MD [address] [place] [address]  DIAGNOSIS: Stage IIA (T2b, N0, M0) non-small cell lung cancer, adenocarcinoma. He presented with a left upper lobe lesion. He was diagnosed in 2024.    Molecular studies  no actionable mutations. Has KRAS G12V which is not actionable   PDL1: 6%  PRIOR THERAPY: He is status post surgical resection by Dr. Cliffton Asters on March 18, 2023.   CURRENT THERAPY: Adjuvant treatment with carboplatin and Alimta for 4 cycles. He is not a good candidate for cisplatin due to his CKD. He is status post 1 cycle. First dose on 04/13/23   INTERVAL HISTORY: William Mcguire 67 y.o. male returns to the clinic today for a follow-up visit.  The patient is accompanied by his wife.  In summary the patient was recently diagnosed with lung cancer and he is status post surgical resection.  He underwent his first cycle of adjuvant chemotherapy and tolerated it well overall except some hiccups following treatment.  He also had some neutropenia.  He required G-CSF injections for the neutropenia.  A few days after having his labs drawn for neutropenia he called the clinic endorsing dysuria and hematuria and was found to have a urinary tract infection.  He was started on antibiotics with Kelfex.   His dysuria has resolved at this time.  Denies any fever, chills, night sweats. He reports a good appetite. He has some intermittent shortness of breath since having surgery but denies any changes. He denies cough. Denies any chest pain or hemoptysis. Denies any nausea or vomiting. He denies diarrhea or constipation.  He is compliant with his folic acid. He is here today for evaluation and repeat blood work before undergoing cycle #2.    MEDICAL HISTORY: Past Medical History:  Diagnosis Date   Diabetes mellitus    High cholesterol    Hypertension     ALLERGIES:  has No Known Allergies.  MEDICATIONS:   Current Outpatient Medications  Medication Sig Dispense Refill   amLODipine (NORVASC) 10 MG tablet TAKE 1 TABLET BY MOUTH EVERY DAY 90 tablet 1   aspirin EC 81 MG tablet Take 81 mg by mouth daily. Swallow whole.     atorvastatin (LIPITOR) 40 MG tablet TAKE 1 TABLET BY MOUTH EVERY DAY 90 tablet 1   chlorproMAZINE (THORAZINE) 25 MG tablet Take 1 tablet (25 mg total) by mouth 3 (three) times daily as needed. 45 tablet 1   cyanocobalamin (VITAMIN B12) 1000 MCG/ML injection INJECT 1 ML (1,000 MCG TOTAL) INTO THE MUSCLE EVERY 30 DAYS. 9 mL 1   dexamethasone (DECADRON) 4 MG tablet Take 1 tablet twice a day the day before, the day of, and the day after chemotherapy 40 tablet 2   folic acid (FOLVITE) 1 MG tablet Take 1 tablet (1 mg total) by mouth daily. 30 tablet 2   glucose blood (ONETOUCH VERIO) test strip Use as instructed to check blood sugars twice daily E11.69 100 each 2   glucose blood test strip Use as instructed to test blood sugar 3 times a day. Dx code: e11.65 100 each 2   insulin glargine (LANTUS) 100 UNIT/ML injection Inject 21 Units into the skin at bedtime.     ondansetron (ZOFRAN) 8 MG tablet Take 1 tablet (8 mg total) by mouth every 8 (eight) hours as needed for nausea or vomiting. Starting day 3 after chemotherapy 30 tablet 2   oxyCODONE (OXY IR/ROXICODONE) 5 MG immediate release tablet  Take 1 tablet (5 mg total) by mouth every 6 (six) hours as needed for moderate pain. 28 tablet 0   prochlorperazine (COMPAZINE) 10 MG tablet Take 1 tablet (10 mg total) by mouth every 6 (six) hours as needed. 270 tablet 1   sildenafil (VIAGRA) 100 MG tablet TAKE 1 TABLET BY MOUTH EVERY DAY AS NEEDED (INSURANCE COVERS 10 TAB PER 77DAYS) 10 tablet 5   SYRINGE-NEEDLE, DISP, 3 ML (B-D INTEGRA SYRINGE) 25G X 5/8" 3 ML MISC USE AS DIRECTED 12 each 24   No current facility-administered medications for this visit.    SURGICAL HISTORY:  Past Surgical History:  Procedure Laterality Date   BRONCHIAL BIOPSY   01/05/2023   Procedure: BRONCHIAL BIOPSIES;  Surgeon: Josephine Igo, DO;  Location: [facility];  Service: Pulmonary;;   BRONCHIAL BRUSHINGS  01/05/2023   Procedure: BRONCHIAL BRUSHINGS;  Surgeon: Josephine Igo, DO;  Location: [facility];  Service: Pulmonary;;   BRONCHIAL NEEDLE ASPIRATION BIOPSY  01/05/2023   Procedure: BRONCHIAL NEEDLE ASPIRATION BIOPSIES;  Surgeon: Josephine Igo, DO;  Location: [facility];  Service: Pulmonary;;   INTERCOSTAL NERVE BLOCK Left 03/18/2023   Procedure: INTERCOSTAL NERVE BLOCK;  Surgeon: Corliss Skains, MD;  Location: [facility] OR;  Service: Thoracic;  Laterality: Left;   KNEE SURGERY Left 2004   torn cartilage   LYMPH NODE BIOPSY Left 03/18/2023   Procedure: LYMPH NODE BIOPSY;  Surgeon: Corliss Skains, MD;  Location: [facility] OR;  Service: Thoracic;  Laterality: Left;   PATELLAR TENDON REPAIR  06/11/2011   Procedure: PATELLA TENDON REPAIR;  Surgeon: Cammy Copa;  Location: [facility];  Service: Orthopedics;  Laterality: Right;    REVIEW OF SYSTEMS:   Constitutional: Positive for fatigue and weight loss. Negative for appetite change, chills, fever.  HENT: Negative for mouth sores, nosebleeds, sore throat and trouble swallowing.   Eyes: Negative for eye problems and icterus.  Respiratory: Stable occasional dyspnea.  Negative for cough, hemoptysis, and wheezing.   Cardiovascular: Negative for chest pain and leg swelling.  Gastrointestinal: Negative for abdominal pain, nausea and vomiting.  Genitourinary: Negative for bladder incontinence, difficulty urinating, dysuria, frequency and hematuria.   Musculoskeletal: Negative for back pain, gait problem, neck pain and neck stiffness.  Skin: Negative for itching and rash.  Neurological: Negative for dizziness, extremity weakness, gait problem, headaches, light-headedness and seizures.  Hematological: Negative for adenopathy. Does not bruise/bleed easily.  Psychiatric/Behavioral: Negative for confusion,  depression and sleep disturbance. The patient is not nervous/anxious.         PHYSICAL EXAMINATION:  Blood pressure (!) 153/83, pulse 95, temperature 98.4 F (36.9 C), temperature source Temporal, resp. rate 18, weight 240 lb (108.9 kg), SpO2 97%.  ECOG PERFORMANCE STATUS: 1  Physical Exam  Constitutional: Oriented to person, place, and time and well-developed, well-nourished, and in no distress.  HENT:  Head: Normocephalic and atraumatic.  Mouth/Throat: Oropharynx is clear and moist. No oropharyngeal exudate.  Eyes: Conjunctivae are normal. Right eye exhibits no discharge. Left eye exhibits no discharge. No scleral icterus.  Neck: Normal range of motion. Neck supple.  Cardiovascular: Normal rate, regular rhythm, normal heart sounds and intact distal pulses.   Pulmonary/Chest: Effort normal and breath sounds normal. No respiratory distress. No wheezes. No rales.  Abdominal: Soft. Bowel sounds are normal. Exhibits no distension and no mass. There is no tenderness.  Musculoskeletal: Normal range of motion. Exhibits no edema.  Lymphadenopathy:    No cervical adenopathy.  Neurological: Alert and oriented to person, place,  and time. Exhibits normal muscle tone. Gait normal. Coordination normal.  Skin: Skin is warm and dry. No rash noted. Not diaphoretic. No erythema. No pallor.  Psychiatric: Mood, memory and judgment normal.  Vitals reviewed.  LABORATORY DATA: Lab Results  Component Value Date   WBC 9.2 05/19/2023   HGB 8.5 (L) 05/19/2023   HCT 25.9 (L) 05/19/2023   MCV 87.2 05/19/2023   PLT 320 05/19/2023      Chemistry      Component Value Date/Time   NA 135 05/08/2023 1347   NA 137 11/05/2022 1152   K 4.4 05/08/2023 1347   CL 104 05/08/2023 1347   CO2 29 05/08/2023 1347   BUN 17 05/08/2023 1347   BUN 11 11/05/2022 1152   CREATININE 1.60 (H) 05/08/2023 1347   CREATININE 1.39 (H) 05/06/2023 1055      Component Value Date/Time   CALCIUM 9.3 05/08/2023 1347   ALKPHOS  133 (H) 05/08/2023 1347   AST 23 05/08/2023 1347   AST 25 05/06/2023 1055   ALT 23 05/08/2023 1347   ALT 25 05/06/2023 1055   BILITOT 0.4 05/08/2023 1347   BILITOT 0.4 05/06/2023 1055       RADIOGRAPHIC STUDIES:  DG Chest 2 View  Result Date: 05/05/2023 CLINICAL DATA:  Status post lobectomy in September. Shortness of breath. EXAM: CHEST - 2 VIEW COMPARISON:  03/26/2023 plain film and CT. FINDINGS: Midline trachea. Normal heart size. Volume loss and architectural distortion centered in the left perihilar region secondary to left upper lobectomy. Resolution of left pleural air. Trace left pleural thickening blunts the costophrenic angle on the frontal. No right-sided pleural effusion or pneumothorax. IMPRESSION: Expected appearance after left upper lobectomy. Resolution of previously described left hydropneumothorax. Electronically Signed   By: Jeronimo Greaves M.D.   On: 05/05/2023 16:36     ASSESSMENT/PLAN:  This is a very pleasant 67 year old African American Male with stage IIA (T2b, N0, M0) non-small cell lung cancer, adenocarcinoma. He presented with a left upper lobe lesion. He was diagnosed in 2024. He is status post surgical resection by Dr. Cliffton Asters on March 18, 2023. Negative for any actionable mutations. PDL1 expression 6%.    He is currently undergoing adjuvant chemotherapy with carboplatin for an AUC of 5 and Alimta. He is not a good canidate for cisplatin due to his CKD. We need to monitor his alimta closely with his CKD. His first dose of treatment was on 04/13/23. He tolerated it well except for fatigue and hiccups.  He also had some neutropenia and required G-CSF injections with Zarxio x 2.  Labs were reviewed.  His creatinine is 1.44 today and GFR 53.  Recommend he proceed with treatment as scheduled. The patient understands that if his GFR <45 that Dr. Arbutus Ped typically would hold alimta. We will continue to monitor this closely on a weekly basis and he was encouraged to  hydrate well at home.    We will need to continue to monitor his anemia and creatinine closely. He had baseline anemia before starting chemotherapy and some CKD.    We will see him in 3 weeks before undergoing cycle #3.    We will need to monitor his anemia closely. He will continue on oral iron supplements for now. We would consider him for a blood transfusion if his Hbg were <8. His hbg is 8.5. The patient understands that it is likely he will need supportive care, such as a blood transfusion throughout the course of his treatment.  We will arrange for weekly labs. We also would recommend zarxio if his weekly labs slow ANC of 0.6 or less.   It is always an option to wait 10 mintues after having his weekly labs drawn to make sure he does not need supportive care prior to leaving. He would just need to notify a staff member that he is waiting for his results.    Unclear if the hiccups are from his surgery with nerve irritation vs premedication with decadron.  Thorazine did not agree with him and made him more tired. We will monitor with his next round of treatment for any hiccups.  The patient was advised to call immediately if he has any concerning symptoms in the interval. The patient voices understanding of current disease status and treatment options and is in agreement with the current care plan. All questions were answered. The patient knows to call the clinic with any problems, questions or concerns. We can certainly see the patient much sooner if necessary    No orders of the defined types were placed in this encounter.    The total time spent in the appointment was 20-29 minutes  Tashawn Laswell L Idara Woodside, PA-C 05/19/23

## 2023-05-15 ENCOUNTER — Ambulatory Visit: Payer: Medicare Other | Admitting: Hematology and Oncology

## 2023-05-18 ENCOUNTER — Other Ambulatory Visit: Payer: Self-pay | Admitting: Internal Medicine

## 2023-05-18 ENCOUNTER — Encounter: Payer: Self-pay | Admitting: Internal Medicine

## 2023-05-18 MED FILL — Fosaprepitant Dimeglumine For IV Infusion 150 MG (Base Eq): INTRAVENOUS | Qty: 5 | Status: AC

## 2023-05-19 ENCOUNTER — Inpatient Hospital Stay: Payer: Medicare Other | Admitting: Physician Assistant

## 2023-05-19 ENCOUNTER — Inpatient Hospital Stay: Payer: Medicare Other

## 2023-05-19 VITALS — BP 123/68 | HR 68 | Temp 98.0°F | Resp 16

## 2023-05-19 VITALS — BP 153/83 | HR 95 | Temp 98.4°F | Resp 18 | Wt 240.0 lb

## 2023-05-19 DIAGNOSIS — Z5111 Encounter for antineoplastic chemotherapy: Secondary | ICD-10-CM | POA: Diagnosis not present

## 2023-05-19 DIAGNOSIS — C3492 Malignant neoplasm of unspecified part of left bronchus or lung: Secondary | ICD-10-CM

## 2023-05-19 DIAGNOSIS — T451X5A Adverse effect of antineoplastic and immunosuppressive drugs, initial encounter: Secondary | ICD-10-CM | POA: Diagnosis not present

## 2023-05-19 DIAGNOSIS — D6481 Anemia due to antineoplastic chemotherapy: Secondary | ICD-10-CM | POA: Diagnosis not present

## 2023-05-19 DIAGNOSIS — R197 Diarrhea, unspecified: Secondary | ICD-10-CM | POA: Diagnosis not present

## 2023-05-19 DIAGNOSIS — Z5189 Encounter for other specified aftercare: Secondary | ICD-10-CM | POA: Diagnosis not present

## 2023-05-19 DIAGNOSIS — Z807 Family history of other malignant neoplasms of lymphoid, hematopoietic and related tissues: Secondary | ICD-10-CM | POA: Diagnosis not present

## 2023-05-19 DIAGNOSIS — N3001 Acute cystitis with hematuria: Secondary | ICD-10-CM | POA: Diagnosis not present

## 2023-05-19 DIAGNOSIS — D701 Agranulocytosis secondary to cancer chemotherapy: Secondary | ICD-10-CM | POA: Diagnosis not present

## 2023-05-19 DIAGNOSIS — D649 Anemia, unspecified: Secondary | ICD-10-CM

## 2023-05-19 DIAGNOSIS — N182 Chronic kidney disease, stage 2 (mild): Secondary | ICD-10-CM | POA: Diagnosis not present

## 2023-05-19 DIAGNOSIS — C3412 Malignant neoplasm of upper lobe, left bronchus or lung: Secondary | ICD-10-CM | POA: Diagnosis not present

## 2023-05-19 DIAGNOSIS — D6959 Other secondary thrombocytopenia: Secondary | ICD-10-CM | POA: Diagnosis not present

## 2023-05-19 LAB — CMP (CANCER CENTER ONLY)
ALT: 19 U/L (ref 0–44)
AST: 29 U/L (ref 15–41)
Albumin: 3.7 g/dL (ref 3.5–5.0)
Alkaline Phosphatase: 127 U/L — ABNORMAL HIGH (ref 38–126)
Anion gap: 4 — ABNORMAL LOW (ref 5–15)
BUN: 19 mg/dL (ref 8–23)
CO2: 23 mmol/L (ref 22–32)
Calcium: 9.7 mg/dL (ref 8.9–10.3)
Chloride: 103 mmol/L (ref 98–111)
Creatinine: 1.44 mg/dL — ABNORMAL HIGH (ref 0.61–1.24)
GFR, Estimated: 53 mL/min — ABNORMAL LOW (ref >60)
Glucose, Bld: 279 mg/dL — ABNORMAL HIGH (ref 70–99)
Potassium: 4.3 mmol/L (ref 3.5–5.1)
Sodium: 130 mmol/L — ABNORMAL LOW (ref 135–145)
Total Bilirubin: 0.3 mg/dL (ref <1.2)
Total Protein: 9.2 g/dL — ABNORMAL HIGH (ref 6.5–8.1)

## 2023-05-19 LAB — CBC WITH DIFFERENTIAL (CANCER CENTER ONLY)
Abs Immature Granulocytes: 0.15 K/uL — ABNORMAL HIGH (ref 0.00–0.07)
Basophils Absolute: 0 K/uL (ref 0.0–0.1)
Basophils Relative: 0 %
Eosinophils Absolute: 0 K/uL (ref 0.0–0.5)
Eosinophils Relative: 0 %
HCT: 25.9 % — ABNORMAL LOW (ref 39.0–52.0)
Hemoglobin: 8.5 g/dL — ABNORMAL LOW (ref 13.0–17.0)
Immature Granulocytes: 2 %
Lymphocytes Relative: 9 %
Lymphs Abs: 0.8 K/uL (ref 0.7–4.0)
MCH: 28.6 pg (ref 26.0–34.0)
MCHC: 32.8 g/dL (ref 30.0–36.0)
MCV: 87.2 fL (ref 80.0–100.0)
Monocytes Absolute: 0.4 K/uL (ref 0.1–1.0)
Monocytes Relative: 5 %
Neutro Abs: 7.8 K/uL — ABNORMAL HIGH (ref 1.7–7.7)
Neutrophils Relative %: 84 %
Platelet Count: 320 K/uL (ref 150–400)
RBC: 2.97 MIL/uL — ABNORMAL LOW (ref 4.22–5.81)
RDW: 14.1 % (ref 11.5–15.5)
WBC Count: 9.2 K/uL (ref 4.0–10.5)
nRBC: 0 % (ref 0.0–0.2)

## 2023-05-19 LAB — SAMPLE TO BLOOD BANK

## 2023-05-19 MED ORDER — PALONOSETRON HCL INJECTION 0.25 MG/5ML
0.2500 mg | Freq: Once | INTRAVENOUS | Status: AC
  Administered 2023-05-19: 0.25 mg via INTRAVENOUS
  Filled 2023-05-19: qty 5

## 2023-05-19 MED ORDER — SODIUM CHLORIDE 0.9% FLUSH: 10.0000 mL | INTRAVENOUS | Status: DC | PRN

## 2023-05-19 MED ORDER — HEPARIN SOD (PORK) LOCK FLUSH 100 UNIT/ML IV SOLN
500.0000 [IU] | Freq: Once | INTRAVENOUS | Status: DC | PRN
Start: 2023-05-19 — End: 2023-05-19

## 2023-05-19 MED ORDER — DEXAMETHASONE SODIUM PHOSPHATE 10 MG/ML IJ SOLN
10.0000 mg | Freq: Once | INTRAMUSCULAR | Status: AC
  Administered 2023-05-19: 10 mg via INTRAVENOUS
  Filled 2023-05-19: qty 1

## 2023-05-19 MED ORDER — SODIUM CHLORIDE 0.9 % IV SOLN
539.5000 mg | Freq: Once | INTRAVENOUS | Status: AC
  Administered 2023-05-19: 539.5 mg via INTRAVENOUS
  Filled 2023-05-19: qty 53.95

## 2023-05-19 MED ORDER — SODIUM CHLORIDE 0.9 % IV SOLN
500.0000 mg/m2 | Freq: Once | INTRAVENOUS | Status: AC
  Administered 2023-05-19: 1200 mg via INTRAVENOUS
  Filled 2023-05-19: qty 40

## 2023-05-19 MED ORDER — SODIUM CHLORIDE 0.9 % IV SOLN: INTRAVENOUS | Status: DC

## 2023-05-19 MED ORDER — SODIUM CHLORIDE 0.9 % IV SOLN
150.0000 mg | Freq: Once | INTRAVENOUS | Status: AC
  Administered 2023-05-19: 150 mg via INTRAVENOUS
  Filled 2023-05-19: qty 150

## 2023-05-19 NOTE — Patient Instructions (Signed)
[facility] - A DEPT OF [facility]  Discharge Instructions: Thank you for choosing [facility] to provide your oncology and hematology care.   If you have a lab appointment with the Cancer Center, please go directly to the Cancer Center and check in at the registration area.   Wear comfortable clothing and clothing appropriate for easy access to any Portacath or PICC line.   We strive to give you quality time with your provider. You may need to reschedule your appointment if you arrive late (15 or more minutes).  Arriving late affects you and other patients whose appointments are after yours.  Also, if you miss three or more appointments without notifying the office, you may be dismissed from the clinic at the provider's discretion.      For prescription refill requests, have your pharmacy contact our office and allow 72 hours for refills to be completed.    Today you received the following chemotherapy and/or immunotherapy agents: Pemetrexed (Alimta) and Carboplatin.      To help prevent nausea and vomiting after your treatment, we encourage you to take your nausea medication as directed.  BELOW ARE SYMPTOMS THAT SHOULD BE REPORTED IMMEDIATELY: *FEVER GREATER THAN 100.4 F (38 C) OR HIGHER *CHILLS OR SWEATING *NAUSEA AND VOMITING THAT IS NOT CONTROLLED WITH YOUR NAUSEA MEDICATION *UNUSUAL SHORTNESS OF BREATH *UNUSUAL BRUISING OR BLEEDING *URINARY PROBLEMS (pain or burning when urinating, or frequent urination) *BOWEL PROBLEMS (unusual diarrhea, constipation, pain near the anus) TENDERNESS IN MOUTH AND THROAT WITH OR WITHOUT PRESENCE OF ULCERS (sore throat, sores in mouth, or a toothache) UNUSUAL RASH, SWELLING OR PAIN  UNUSUAL VAGINAL DISCHARGE OR ITCHING   Items with * indicate a potential emergency and should be followed up as soon as possible or go to the Emergency Department if any problems should occur.  Please show the CHEMOTHERAPY  ALERT CARD or IMMUNOTHERAPY ALERT CARD at check-in to the Emergency Department and triage nurse.  Should you have questions after your visit or need to cancel or reschedule your appointment, please contact [facility] - A DEPT OF [facility]  Dept: [phone]  and follow the prompts.  Office hours are 8:00 a.m. to 4:30 p.m. Monday - Friday. Please note that voicemails left after 4:00 p.m. may not be returned until the following business day.  We are closed weekends and major holidays. You have access to a nurse at all times for urgent questions. Please call the main number to the clinic Dept: [phone] and follow the prompts.   For any non-urgent questions, you may also contact your provider using MyChart. We now offer e-Visits for anyone 37 and older to request care online for non-urgent symptoms. For details visit [email or web address].   Also download the MyChart app! Go to the app store, search "MyChart", open the app, select [place], and log in with your MyChart username and password.

## 2023-05-25 ENCOUNTER — Other Ambulatory Visit: Payer: Self-pay

## 2023-05-25 ENCOUNTER — Inpatient Hospital Stay: Payer: Medicare Other | Attending: Hematology and Oncology

## 2023-05-25 DIAGNOSIS — C3412 Malignant neoplasm of upper lobe, left bronchus or lung: Secondary | ICD-10-CM | POA: Diagnosis not present

## 2023-05-25 DIAGNOSIS — Z5111 Encounter for antineoplastic chemotherapy: Secondary | ICD-10-CM | POA: Diagnosis not present

## 2023-05-25 DIAGNOSIS — N189 Chronic kidney disease, unspecified: Secondary | ICD-10-CM | POA: Insufficient documentation

## 2023-05-25 DIAGNOSIS — C3492 Malignant neoplasm of unspecified part of left bronchus or lung: Secondary | ICD-10-CM

## 2023-05-25 DIAGNOSIS — D649 Anemia, unspecified: Secondary | ICD-10-CM

## 2023-05-25 DIAGNOSIS — T451X5A Adverse effect of antineoplastic and immunosuppressive drugs, initial encounter: Secondary | ICD-10-CM

## 2023-05-25 LAB — CBC WITH DIFFERENTIAL (CANCER CENTER ONLY)
Abs Immature Granulocytes: 0.02 10*3/uL (ref 0.00–0.07)
Basophils Absolute: 0 10*3/uL (ref 0.0–0.1)
Basophils Relative: 0 %
Eosinophils Absolute: 0 10*3/uL (ref 0.0–0.5)
Eosinophils Relative: 1 %
HCT: 26.2 % — ABNORMAL LOW (ref 39.0–52.0)
Hemoglobin: 9 g/dL — ABNORMAL LOW (ref 13.0–17.0)
Immature Granulocytes: 1 %
Lymphocytes Relative: 27 %
Lymphs Abs: 1 10*3/uL (ref 0.7–4.0)
MCH: 29.7 pg (ref 26.0–34.0)
MCHC: 34.4 g/dL (ref 30.0–36.0)
MCV: 86.5 fL (ref 80.0–100.0)
Monocytes Absolute: 0.1 10*3/uL (ref 0.1–1.0)
Monocytes Relative: 3 %
Neutro Abs: 2.5 10*3/uL (ref 1.7–7.7)
Neutrophils Relative %: 68 %
Platelet Count: 263 10*3/uL (ref 150–400)
RBC: 3.03 MIL/uL — ABNORMAL LOW (ref 4.22–5.81)
RDW: 14.1 % (ref 11.5–15.5)
Smear Review: NORMAL
WBC Count: 3.6 10*3/uL — ABNORMAL LOW (ref 4.0–10.5)
nRBC: 0 % (ref 0.0–0.2)

## 2023-05-25 LAB — SAMPLE TO BLOOD BANK

## 2023-05-31 DIAGNOSIS — N390 Urinary tract infection, site not specified: Secondary | ICD-10-CM | POA: Diagnosis not present

## 2023-06-01 ENCOUNTER — Inpatient Hospital Stay: Payer: Medicare Other

## 2023-06-01 DIAGNOSIS — N189 Chronic kidney disease, unspecified: Secondary | ICD-10-CM | POA: Diagnosis not present

## 2023-06-01 DIAGNOSIS — Z5111 Encounter for antineoplastic chemotherapy: Secondary | ICD-10-CM | POA: Diagnosis not present

## 2023-06-01 DIAGNOSIS — D649 Anemia, unspecified: Secondary | ICD-10-CM

## 2023-06-01 DIAGNOSIS — C3492 Malignant neoplasm of unspecified part of left bronchus or lung: Secondary | ICD-10-CM

## 2023-06-01 DIAGNOSIS — C3412 Malignant neoplasm of upper lobe, left bronchus or lung: Secondary | ICD-10-CM | POA: Diagnosis not present

## 2023-06-01 LAB — CBC WITH DIFFERENTIAL (CANCER CENTER ONLY)
Abs Immature Granulocytes: 0.01 10*3/uL (ref 0.00–0.07)
Basophils Absolute: 0 10*3/uL (ref 0.0–0.1)
Basophils Relative: 0 %
Eosinophils Absolute: 0.1 10*3/uL (ref 0.0–0.5)
Eosinophils Relative: 3 %
HCT: 25.2 % — ABNORMAL LOW (ref 39.0–52.0)
Hemoglobin: 8.1 g/dL — ABNORMAL LOW (ref 13.0–17.0)
Immature Granulocytes: 0 %
Lymphocytes Relative: 41 %
Lymphs Abs: 1.2 10*3/uL (ref 0.7–4.0)
MCH: 28.2 pg (ref 26.0–34.0)
MCHC: 32.1 g/dL (ref 30.0–36.0)
MCV: 87.8 fL (ref 80.0–100.0)
Monocytes Absolute: 0.3 10*3/uL (ref 0.1–1.0)
Monocytes Relative: 10 %
Neutro Abs: 1.4 10*3/uL — ABNORMAL LOW (ref 1.7–7.7)
Neutrophils Relative %: 46 %
Platelet Count: 104 10*3/uL — ABNORMAL LOW (ref 150–400)
RBC: 2.87 MIL/uL — ABNORMAL LOW (ref 4.22–5.81)
RDW: 14 % (ref 11.5–15.5)
WBC Count: 3 10*3/uL — ABNORMAL LOW (ref 4.0–10.5)
nRBC: 0 % (ref 0.0–0.2)

## 2023-06-01 LAB — CMP (CANCER CENTER ONLY)
ALT: 22 U/L (ref 0–44)
AST: 29 U/L (ref 15–41)
Albumin: 3.4 g/dL — ABNORMAL LOW (ref 3.5–5.0)
Alkaline Phosphatase: 129 U/L — ABNORMAL HIGH (ref 38–126)
Anion gap: 3 — ABNORMAL LOW (ref 5–15)
BUN: 18 mg/dL (ref 8–23)
CO2: 26 mmol/L (ref 22–32)
Calcium: 9.4 mg/dL (ref 8.9–10.3)
Chloride: 103 mmol/L (ref 98–111)
Creatinine: 1.71 mg/dL — ABNORMAL HIGH (ref 0.61–1.24)
GFR, Estimated: 43 mL/min — ABNORMAL LOW (ref >60)
Glucose, Bld: 173 mg/dL — ABNORMAL HIGH (ref 70–99)
Potassium: 4.6 mmol/L (ref 3.5–5.1)
Sodium: 132 mmol/L — ABNORMAL LOW (ref 135–145)
Total Bilirubin: 0.3 mg/dL (ref <1.2)
Total Protein: 9 g/dL — ABNORMAL HIGH (ref 6.5–8.1)

## 2023-06-01 LAB — SAMPLE TO BLOOD BANK

## 2023-06-03 DIAGNOSIS — Z860101 Personal history of adenomatous and serrated colon polyps: Secondary | ICD-10-CM | POA: Diagnosis not present

## 2023-06-03 DIAGNOSIS — D649 Anemia, unspecified: Secondary | ICD-10-CM | POA: Diagnosis not present

## 2023-06-03 DIAGNOSIS — Z85118 Personal history of other malignant neoplasm of bronchus and lung: Secondary | ICD-10-CM | POA: Diagnosis not present

## 2023-06-03 NOTE — Progress Notes (Signed)
[facility] OFFICE PROGRESS NOTE  [name], MD [address] [place] [address]  DIAGNOSIS: Stage IIA (T2b, N0, M0) non-small cell lung cancer, adenocarcinoma. He presented with a left upper lobe lesion. He was diagnosed in 2024.    Molecular studies  no actionable mutations. Has KRAS G12V which is not actionable   PDL1: 6%  PRIOR THERAPY:  He is status post surgical resection by Dr. Cliffton Asters on March 18, 2023.   CURRENT THERAPY: Adjuvant treatment with carboplatin and Alimta for 4 cycles. He is not a good candidate for cisplatin due to his CKD. He is status post 2 cycles. First dose on 04/13/23   INTERVAL HISTORY: William Mcguire 67 y.o. male returns to the clinic today for a follow-up visit.   In summary the patient was recently diagnosed with lung cancer and he is status post surgical resection.  He underwent his first two cycles of adjuvant chemotherapy and tolerated it well overall except some hiccups following his treatments.  This last approximately 2 to 3 days.  We previously discussed this may be from Decadron but the patient would like to continue his Decadron to help manage any adverse side effects of treatment.  He also has 1 episode of diarrhea following treatments.  Following treatments, he is also had dysuria which has been negative for UTI but he has been treated with antibiotic.  The most recent episode was approximately 1 week ago on Sunday 12/8.  He saw his PCP last week who states that if this occurs again that they would consider referring him to urology.  Denies any fever, chills, night sweats. He reports a good appetite. His breathing is "good". He denies cough. Denies any chest pain or hemoptysis. Denies any nausea or vomiting. He denies constipation.  He is compliant with his folic acid. He is here today for evaluation and repeat blood work before undergoing cycle #3.    MEDICAL HISTORY: Past Medical History:  Diagnosis Date    Diabetes mellitus    High cholesterol    Hypertension     ALLERGIES:  has no known allergies.  MEDICATIONS:  Current Outpatient Medications  Medication Sig Dispense Refill   amLODipine (NORVASC) 10 MG tablet TAKE 1 TABLET BY MOUTH EVERY DAY 90 tablet 1   aspirin EC 81 MG tablet Take 81 mg by mouth daily. Swallow whole.     atorvastatin (LIPITOR) 40 MG tablet TAKE 1 TABLET BY MOUTH EVERY DAY 90 tablet 1   chlorproMAZINE (THORAZINE) 25 MG tablet Take 1 tablet (25 mg total) by mouth 3 (three) times daily as needed. 45 tablet 1   cyanocobalamin (VITAMIN B12) 1000 MCG/ML injection INJECT 1 ML (1,000 MCG TOTAL) INTO THE MUSCLE EVERY 30 DAYS. 9 mL 1   dexamethasone (DECADRON) 4 MG tablet Take 1 tablet twice a day the day before, the day of, and the day after chemotherapy 40 tablet 2   folic acid (FOLVITE) 1 MG tablet Take 1 tablet (1 mg total) by mouth daily. 30 tablet 2   glucose blood (ONETOUCH VERIO) test strip Use as instructed to check blood sugars twice daily E11.69 100 each 2   glucose blood test strip Use as instructed to test blood sugar 3 times a day. Dx code: e11.65 100 each 2   insulin glargine (LANTUS) 100 UNIT/ML injection Inject 21 Units into the skin at bedtime.     ondansetron (ZOFRAN) 8 MG tablet Take 1 tablet (8 mg total) by mouth every 8 (  eight) hours as needed for nausea or vomiting. Starting day 3 after chemotherapy 30 tablet 2   oxyCODONE (OXY IR/ROXICODONE) 5 MG immediate release tablet Take 1 tablet (5 mg total) by mouth every 6 (six) hours as needed for moderate pain. 28 tablet 0   prochlorperazine (COMPAZINE) 10 MG tablet Take 1 tablet (10 mg total) by mouth every 6 (six) hours as needed. 270 tablet 1   sildenafil (VIAGRA) 100 MG tablet TAKE 1 TABLET BY MOUTH EVERY DAY AS NEEDED (INSURANCE COVERS 10 TAB PER 77DAYS) 10 tablet 5   SYRINGE-NEEDLE, DISP, 3 ML (B-D INTEGRA SYRINGE) 25G X 5/8" 3 ML MISC USE AS DIRECTED 12 each 24   No current facility-administered  medications for this visit.    SURGICAL HISTORY:  Past Surgical History:  Procedure Laterality Date   BRONCHIAL BIOPSY  01/05/2023   Procedure: BRONCHIAL BIOPSIES;  Surgeon: Josephine Igo, DO;  Location: [facility];  Service: Pulmonary;;   BRONCHIAL BRUSHINGS  01/05/2023   Procedure: BRONCHIAL BRUSHINGS;  Surgeon: Josephine Igo, DO;  Location: [facility];  Service: Pulmonary;;   BRONCHIAL NEEDLE ASPIRATION BIOPSY  01/05/2023   Procedure: BRONCHIAL NEEDLE ASPIRATION BIOPSIES;  Surgeon: Josephine Igo, DO;  Location: [facility];  Service: Pulmonary;;   INTERCOSTAL NERVE BLOCK Left 03/18/2023   Procedure: INTERCOSTAL NERVE BLOCK;  Surgeon: Corliss Skains, MD;  Location: [facility] OR;  Service: Thoracic;  Laterality: Left;   KNEE SURGERY Left 2004   torn cartilage   LYMPH NODE BIOPSY Left 03/18/2023   Procedure: LYMPH NODE BIOPSY;  Surgeon: Corliss Skains, MD;  Location: [facility];  Service: Thoracic;  Laterality: Left;   PATELLAR TENDON REPAIR  06/11/2011   Procedure: PATELLA TENDON REPAIR;  Surgeon: Cammy Copa;  Location: [facility];  Service: Orthopedics;  Laterality: Right;    REVIEW OF SYSTEMS:   Review of Systems  Constitutional: Negative for appetite change, chills, fatigue, fever and unexpected weight change.  HENT: Negative for mouth sores, nosebleeds, sore throat and trouble swallowing.   Eyes: Negative for eye problems and icterus.  Respiratory: Negative for cough, hemoptysis, shortness of breath and wheezing.   Cardiovascular: Negative for chest pain and leg swelling.  Gastrointestinal: Negative for abdominal pain, constipation, diarrhea, nausea and vomiting.  Genitourinary: Negative for bladder incontinence, difficulty urinating, dysuria (resolved), frequency and hematuria (resolved).   Musculoskeletal: Negative for back pain, gait problem, neck pain and neck stiffness.  Skin: Negative for itching and rash.  Neurological: Negative for dizziness, extremity  weakness, gait problem, headaches, light-headedness and seizures.  Hematological: Negative for adenopathy. Does not bruise/bleed easily.  Psychiatric/Behavioral: Negative for confusion, depression and sleep disturbance. The patient is not nervous/anxious.     PHYSICAL EXAMINATION:  Blood pressure (!) 164/78, pulse 98, temperature 97.8 F (36.6 C), temperature source Temporal, resp. rate 16, weight 235 lb 12.8 oz (107 kg), SpO2 100%.  ECOG PERFORMANCE STATUS: 1  Physical Exam  Constitutional: Oriented to person, place, and time and well-developed, well-nourished, and in no distress.  HENT:  Head: Normocephalic and atraumatic.  Mouth/Throat: Oropharynx is clear and moist. No oropharyngeal exudate.  Eyes: Conjunctivae are normal. Right eye exhibits no discharge. Left eye exhibits no discharge. No scleral icterus.  Neck: Normal range of motion. Neck supple.  Cardiovascular: Normal rate, regular rhythm, normal heart sounds and intact distal pulses.   Pulmonary/Chest: Effort normal and breath sounds normal. No respiratory distress. No wheezes. No rales.  Abdominal: Soft. Bowel sounds are normal. Exhibits no distension and no  mass. There is no tenderness.  Musculoskeletal: Normal range of motion. Exhibits no edema.  Lymphadenopathy:    No cervical adenopathy.  Neurological: Alert and oriented to person, place, and time. Exhibits normal muscle tone. Gait normal. Coordination normal.  Skin: Skin is warm and dry. No rash noted. Not diaphoretic. No erythema. No pallor.  Psychiatric: Mood, memory and judgment normal.  Vitals reviewed.  LABORATORY DATA: Lab Results  Component Value Date   WBC 4.0 06/08/2023   HGB 8.5 (L) 06/08/2023   HCT 26.0 (L) 06/08/2023   MCV 87.0 06/08/2023   PLT 243 06/08/2023      Chemistry      Component Value Date/Time   NA 132 (L) 06/08/2023 0859   NA 137 11/05/2022 1152   K 4.5 06/08/2023 0859   CL 104 06/08/2023 0859   CO2 24 06/08/2023 0859   BUN 19  06/08/2023 0859   BUN 11 11/05/2022 1152   CREATININE 1.58 (H) 06/08/2023 0859      Component Value Date/Time   CALCIUM 9.8 06/08/2023 0859   ALKPHOS 133 (H) 06/08/2023 0859   AST 34 06/08/2023 0859   ALT 20 06/08/2023 0859   BILITOT 0.3 06/08/2023 0859       RADIOGRAPHIC STUDIES:  No results found.   ASSESSMENT/PLAN:  This is a very pleasant 67 year old African American Male with stage IIA (T2b, N0, M0) non-small cell lung cancer, adenocarcinoma. He presented with a left upper lobe lesion. He was diagnosed in 2024. He is status post surgical resection by Dr. Cliffton Asters on March 18, 2023. Negative for any actionable mutations. PDL1 expression 6%.    He is currently undergoing adjuvant chemotherapy with carboplatin for an AUC of 5 and Alimta. He is not a good canidate for cisplatin due to his CKD. We need to monitor his alimta closely with his CKD. His first dose of treatment was on 04/13/23. He tolerated it well except for fatigue and hiccups with cycle #1.  He also had some neutropenia and required G-CSF injections with Zarxio x 2 with cycle #1.  Labs were reviewed.  His creatinine is 1.58 today and GFR 47. I reviewed his labs with Dr. Arbutus Ped. Recommend he proceed with treatment as scheduled. The patient understands that if his GFR <45 that Dr. Arbutus Ped typically would hold alimta or consider dose reduction. We will continue to monitor this closely on a weekly basis and he was encouraged to hydrate well at home.   We will see him in 3 weeks before undergoing cycle #4.    We will need to monitor his anemia closely. He will continue on oral iron supplements for now. We would consider him for a blood transfusion if his Hbg were <8. His hbg is 8.5. The patient understands that it is likely he will need supportive care, such as a blood transfusion throughout the course of his treatment. We will arrange for weekly labs. We also would recommend zarxio if his weekly labs slow ANC of 0.6 or  less.    It is always an option to wait 10 mintues after having his weekly labs drawn to make sure he does not need supportive care prior to leaving. He would just need to notify a staff member that he is waiting for his results.   The patient would like to keep his premedications the same but understands that this could cause hiccups.  Patient typically has 1 self-limiting episode of diarrhea following treatments.  The patient understands if he has more  diarrhea that it is okay for him to take Imodium.  If the patient ever has any recurring bouts of dysuria/hematuria, I think it would be a good idea to see a urologist.  He is scheduled for colonoscopy on 06/25/2023.  His next treatment is scheduled for 06/29/2023.  I will check with Dr. Arbutus Ped to see if this is okay.   The patient was advised to call immediately if he has any concerning symptoms in the interval. The patient voices understanding of current disease status and treatment options and is in agreement with the current care plan. All questions were answered. The patient knows to call the clinic with any problems, questions or concerns. We can certainly see the patient much sooner if necessary     No orders of the defined types were placed in this encounter.   The total time spent in the appointment was 20-29 minutes  Mihika Surrette L Danyle Boening, PA-C 06/08/23

## 2023-06-04 ENCOUNTER — Ambulatory Visit: Payer: Medicare Other | Admitting: Internal Medicine

## 2023-06-04 VITALS — BP 150/80 | HR 75 | Temp 97.9°F | Ht 75.0 in | Wt 231.8 lb

## 2023-06-04 DIAGNOSIS — D509 Iron deficiency anemia, unspecified: Secondary | ICD-10-CM | POA: Diagnosis not present

## 2023-06-04 DIAGNOSIS — N182 Chronic kidney disease, stage 2 (mild): Secondary | ICD-10-CM

## 2023-06-04 DIAGNOSIS — Z794 Long term (current) use of insulin: Secondary | ICD-10-CM

## 2023-06-04 DIAGNOSIS — E1122 Type 2 diabetes mellitus with diabetic chronic kidney disease: Secondary | ICD-10-CM | POA: Diagnosis not present

## 2023-06-04 DIAGNOSIS — I129 Hypertensive chronic kidney disease with stage 1 through stage 4 chronic kidney disease, or unspecified chronic kidney disease: Secondary | ICD-10-CM

## 2023-06-04 DIAGNOSIS — C3492 Malignant neoplasm of unspecified part of left bronchus or lung: Secondary | ICD-10-CM

## 2023-06-04 NOTE — Patient Instructions (Addendum)
[phone]  Call Dr.Dia Jefferys with dose of enalapril  Hypertension, Adult Hypertension is another name for high blood pressure. High blood pressure forces your heart to work harder to pump blood. This can cause problems over time. There are two numbers in a blood pressure reading. There is a top number (systolic) over a bottom number (diastolic). It is best to have a blood pressure that is below 120/80. What are the causes? The cause of this condition is not known. Some other conditions can lead to high blood pressure. What increases the risk? Some lifestyle factors can make you more likely to develop high blood pressure: Smoking. Not getting enough exercise or physical activity. Being overweight. Having too much fat, sugar, calories, or salt (sodium) in your diet. Drinking too much alcohol. Other risk factors include: Having any of these conditions: Heart disease. Diabetes. High cholesterol. Kidney disease. Obstructive sleep apnea. Having a family history of high blood pressure and high cholesterol. Age. The risk increases with age. Stress. What are the signs or symptoms? High blood pressure may not cause symptoms. Very high blood pressure (hypertensive crisis) may cause: Headache. Fast or uneven heartbeats (palpitations). Shortness of breath. Nosebleed. Vomiting or feeling like you may vomit (nauseous). Changes in how you see. Very bad chest pain. Feeling dizzy. Seizures. How is this treated? This condition is treated by making healthy lifestyle changes, such as: Eating healthy foods. Exercising more. Drinking less alcohol. Your doctor may prescribe medicine if lifestyle changes do not help enough and if: Your top number is above 130. Your bottom number is above 80. Your personal target blood pressure may vary. Follow these instructions at home: Eating and drinking  If told, follow the DASH eating plan. To follow this plan: Fill one half of your plate at  each meal with fruits and vegetables. Fill one fourth of your plate at each meal with whole grains. Whole grains include whole-wheat pasta, brown rice, and whole-grain bread. Eat or drink low-fat dairy products, such as skim milk or low-fat yogurt. Fill one fourth of your plate at each meal with low-fat (lean) proteins. Low-fat proteins include fish, chicken without skin, eggs, beans, and tofu. Avoid fatty meat, cured and processed meat, or chicken with skin. Avoid pre-made or processed food. Limit the amount of salt in your diet to less than 1,500 mg each day. Do not drink alcohol if: Your doctor tells you not to drink. You are pregnant, may be pregnant, or are planning to become pregnant. If you drink alcohol: Limit how much you have to: 0-1 drink a day for women. 0-2 drinks a day for men. Know how much alcohol is in your drink. In the [place] one drink equals one 12 oz bottle of beer (355 mL), one 5 oz glass of wine (148 mL), or one 1 oz glass of hard liquor (44 mL). Lifestyle  Work with your doctor to stay at a healthy weight or to lose weight. Ask your doctor what the best weight is for you. Get at least 30 minutes of exercise that causes your heart to beat faster (aerobic exercise) most days of the week. This may include walking, swimming, or biking. Get at least 30 minutes of exercise that strengthens your muscles (resistance exercise) at least 3 days a week. This may include lifting weights or doing Pilates. Do not smoke or use any products that contain nicotine or tobacco. If you need help quitting, ask your doctor. Check your blood pressure at home as told by  your doctor. Keep all follow-up visits. Medicines Take over-the-counter and prescription medicines only as told by your doctor. Follow directions carefully. Do not skip doses of blood pressure medicine. The medicine does not work as well if you skip doses. Skipping doses also puts you at risk for problems. Ask your doctor  about side effects or reactions to medicines that you should watch for. Contact a doctor if: You think you are having a reaction to the medicine you are taking. You have headaches that keep coming back. You feel dizzy. You have swelling in your ankles. You have trouble with your vision. Get help right away if: You get a very bad headache. You start to feel mixed up (confused). You feel weak or numb. You feel faint. You have very bad pain in your: Chest. Belly (abdomen). You vomit more than once. You have trouble breathing. These symptoms may be an emergency. Get help right away. Call 911. Do not wait to see if the symptoms will go away. Do not drive yourself to the hospital. Summary Hypertension is another name for high blood pressure. High blood pressure forces your heart to work harder to pump blood. For most people, a normal blood pressure is less than 120/80. Making healthy choices can help lower blood pressure. If your blood pressure does not get lower with healthy choices, you may need to take medicine. This information is not intended to replace advice given to you by your health care provider. Make sure you discuss any questions you have with your health care provider. Document Revised: 03/28/2021 Document Reviewed: 03/28/2021 Elsevier Patient Education  2024 [employer].

## 2023-06-04 NOTE — Progress Notes (Unsigned)
I,William Mcguire, CMA,acting as a Neurosurgeon for William Aliment, MD.,have documented all relevant documentation on the behalf of William Aliment, MD,as directed by  William Aliment, MD while in the presence of William Aliment, MD.  Subjective:  Patient ID: William Mcguire , male    DOB: [birth year] , 67 y.o.   MRN: [id number]  Chief Complaint  Patient presents with   Hypertension   Diabetes    HPI  He is here today for diabetes/HTN f/u.  He reports compliance with meds. He denies headache, chest pain, SOB. He reports no specific questions or concerns. He is accompanied by his wife today.  He reports having his colonoscopy scheduled.    He adds that a Agricultural consultant contacted him and stated she was going to increase the dose of his enalapril to 40mg . He has yet to pick up the rx.    Diabetes He presents for his follow-up diabetic visit. He has type 2 diabetes mellitus. His disease course has been stable. There are no hypoglycemic associated symptoms. Pertinent negatives for diabetes include no blurred vision, no chest pain, no polydipsia, no polyphagia and no polyuria. There are no hypoglycemic complications. Diabetic complications include nephropathy. Risk factors for coronary artery disease include diabetes mellitus, dyslipidemia, hypertension, male sex and sedentary lifestyle. His breakfast blood glucose is taken between 8-9 am. His breakfast blood glucose range is generally 90-110 mg/dl.  Hypertension This is a chronic problem. The current episode started more than 1 year ago. The problem has been gradually improving since onset. The problem is controlled. Pertinent negatives include no blurred vision, chest pain, palpitations or shortness of breath. Risk factors for coronary artery disease include diabetes mellitus, dyslipidemia and male gender. Past treatments include calcium channel blockers.     Past Medical History:  Diagnosis Date   Diabetes mellitus    High cholesterol     Hypertension      Family History  Problem Relation Age of Onset   Hypertension Mother    Multiple myeloma Mother    Hypertension Father    Diabetes Father      Current Outpatient Medications:    amLODipine (NORVASC) 10 MG tablet, TAKE 1 TABLET BY MOUTH EVERY DAY, Disp: 90 tablet, Rfl: 1   aspirin EC 81 MG tablet, Take 81 mg by mouth daily. Swallow whole., Disp: , Rfl:    atorvastatin (LIPITOR) 40 MG tablet, TAKE 1 TABLET BY MOUTH EVERY DAY, Disp: 90 tablet, Rfl: 1   chlorproMAZINE (THORAZINE) 25 MG tablet, Take 1 tablet (25 mg total) by mouth 3 (three) times daily as needed., Disp: 45 tablet, Rfl: 1   cyanocobalamin (VITAMIN B12) 1000 MCG/ML injection, INJECT 1 ML (1,000 MCG TOTAL) INTO THE MUSCLE EVERY 30 DAYS., Disp: 9 mL, Rfl: 1   dexamethasone (DECADRON) 4 MG tablet, Take 1 tablet twice a day the day before, the day of, and the day after chemotherapy, Disp: 40 tablet, Rfl: 2   folic acid (FOLVITE) 1 MG tablet, Take 1 tablet (1 mg total) by mouth daily., Disp: 30 tablet, Rfl: 2   glucose blood test strip, Use as instructed to test blood sugar 3 times a day. Dx code: e11.65, Disp: 100 each, Rfl: 2   insulin glargine (LANTUS) 100 UNIT/ML injection, Inject 21 Units into the skin at bedtime., Disp: , Rfl:    ondansetron (ZOFRAN) 8 MG tablet, Take 1 tablet (8 mg total) by mouth every 8 (eight) hours as needed for nausea or vomiting. Starting day 3  after chemotherapy, Disp: 30 tablet, Rfl: 2   oxyCODONE (OXY IR/ROXICODONE) 5 MG immediate release tablet, Take 1 tablet (5 mg total) by mouth every 6 (six) hours as needed for moderate pain., Disp: 28 tablet, Rfl: 0   sildenafil (VIAGRA) 100 MG tablet, TAKE 1 TABLET BY MOUTH EVERY DAY AS NEEDED (INSURANCE COVERS 10 TAB PER 77DAYS), Disp: 10 tablet, Rfl: 5   SYRINGE-NEEDLE, DISP, 3 ML (B-D INTEGRA SYRINGE) 25G X 5/8" 3 ML MISC, USE AS DIRECTED, Disp: 12 each, Rfl: 24   glucose blood (ONETOUCH VERIO) test strip, Use as instructed to check blood  sugars twice daily E11.69, Disp: 100 each, Rfl: 2   prochlorperazine (COMPAZINE) 10 MG tablet, Take 1 tablet (10 mg total) by mouth every 6 (six) hours as needed., Disp: 270 tablet, Rfl: 1   No Known Allergies   Review of Systems  Constitutional: Negative.   HENT: Negative.    Eyes:  Negative for blurred vision.  Respiratory: Negative.  Negative for shortness of breath.   Cardiovascular:  Negative for chest pain and palpitations.  Gastrointestinal: Negative.   Endocrine: Negative for polydipsia, polyphagia and polyuria.  Genitourinary:        Started to have burning with urination last Friday. Wife reports this. Sunday morning he went to [facility] told him it was cloudy. He was started on Cipro. On Tuesday,   Skin: Negative.   Allergic/Immunologic: Negative.   Neurological: Negative.   Hematological: Negative.      Today's Vitals   06/04/23 1101 06/04/23 1142  BP: (!) 150/82 (!) 150/80  Pulse: 75   Temp: 97.9 F (36.6 C)   SpO2: 98%   Weight: 231 lb 12.8 oz (105.1 kg)   Height: 6\' 3"  (1.905 m)    Body mass index is 28.97 kg/m.  Wt Readings from Last 3 Encounters:  06/08/23 235 lb 12.8 oz (107 kg)  06/04/23 231 lb 12.8 oz (105.1 kg)  05/19/23 240 lb (108.9 kg)     Objective:  Physical Exam Vitals and nursing note reviewed.  Constitutional:      Appearance: Normal appearance.  HENT:     Head: Normocephalic and atraumatic.  Eyes:     Extraocular Movements: Extraocular movements intact.  Cardiovascular:     Rate and Rhythm: Normal rate and regular rhythm.     Heart sounds: Normal heart sounds.  Pulmonary:     Effort: Pulmonary effort is normal.     Breath sounds: Normal breath sounds.  Musculoskeletal:     Cervical back: Normal range of motion.  Skin:    General: Skin is warm.  Neurological:     General: No focal deficit present.     Mental Status: He is alert.  Psychiatric:        Mood and Affect: Mood normal.         Assessment And Plan:  Hypertensive  nephropathy  Type 2 diabetes mellitus with stage 2 chronic kidney disease, with long-term current use of insulin (HCC) -     Hemoglobin A1c  Chronic renal disease, stage II  Iron deficiency anemia, unspecified iron deficiency anemia type  Adenocarcinoma of left lung (HCC)     Return if symptoms worsen or fail to improve, for NV 12/31 - bp check.  Patient was given opportunity to ask questions. Patient verbalized understanding of the plan and was able to repeat key elements of the plan. All questions were answered to their satisfaction.    I, William Aliment, MD, have  reviewed all documentation for this visit. The documentation on 06/04/23 for the exam, diagnosis, procedures, and orders are all accurate and complete.   IF YOU HAVE BEEN REFERRED TO A SPECIALIST, IT MAY TAKE 1-2 WEEKS TO SCHEDULE/PROCESS THE REFERRAL. IF YOU HAVE NOT HEARD FROM US/SPECIALIST IN TWO WEEKS, PLEASE GIVE [place] A CALL AT [phone] X [phone].   THE PATIENT IS ENCOURAGED TO PRACTICE SOCIAL DISTANCING DUE TO THE COVID-19 PANDEMIC.

## 2023-06-05 ENCOUNTER — Other Ambulatory Visit: Payer: Self-pay | Admitting: Pharmacist

## 2023-06-05 ENCOUNTER — Other Ambulatory Visit: Payer: Self-pay | Admitting: Physician Assistant

## 2023-06-05 DIAGNOSIS — C3490 Malignant neoplasm of unspecified part of unspecified bronchus or lung: Secondary | ICD-10-CM

## 2023-06-05 MED FILL — Fosaprepitant Dimeglumine For IV Infusion 150 MG (Base Eq): INTRAVENOUS | Qty: 5 | Status: AC

## 2023-06-05 NOTE — Progress Notes (Signed)
Pharmacy Quality Measure Review  This patient is appearing on a report for being at risk of failing the adherence measure for cholesterol (statin) medications this calendar year.   Medication: atorvastatin 40 mg Last fill date: 12/10 for 90 day supply  Insurance report was not up to date. No action needed at this time.   Catie Eppie Gibson, PharmD, BCACP, CPP Clinical Pharmacist [facility] [phone]

## 2023-06-08 ENCOUNTER — Other Ambulatory Visit: Payer: Medicare Other

## 2023-06-08 ENCOUNTER — Inpatient Hospital Stay: Payer: Medicare Other

## 2023-06-08 ENCOUNTER — Ambulatory Visit: Payer: Medicare Other | Admitting: Physician Assistant

## 2023-06-08 ENCOUNTER — Ambulatory Visit: Payer: Medicare Other

## 2023-06-08 ENCOUNTER — Inpatient Hospital Stay: Payer: Medicare Other | Admitting: Physician Assistant

## 2023-06-08 VITALS — BP 164/78 | HR 98 | Temp 97.8°F | Resp 16 | Wt 235.8 lb

## 2023-06-08 DIAGNOSIS — C3412 Malignant neoplasm of upper lobe, left bronchus or lung: Secondary | ICD-10-CM | POA: Diagnosis not present

## 2023-06-08 DIAGNOSIS — N189 Chronic kidney disease, unspecified: Secondary | ICD-10-CM | POA: Diagnosis not present

## 2023-06-08 DIAGNOSIS — C3492 Malignant neoplasm of unspecified part of left bronchus or lung: Secondary | ICD-10-CM

## 2023-06-08 DIAGNOSIS — Z5111 Encounter for antineoplastic chemotherapy: Secondary | ICD-10-CM | POA: Diagnosis not present

## 2023-06-08 DIAGNOSIS — C3490 Malignant neoplasm of unspecified part of unspecified bronchus or lung: Secondary | ICD-10-CM

## 2023-06-08 LAB — CBC WITH DIFFERENTIAL (CANCER CENTER ONLY)
Abs Immature Granulocytes: 0.1 10*3/uL — ABNORMAL HIGH (ref 0.00–0.07)
Basophils Absolute: 0 10*3/uL (ref 0.0–0.1)
Basophils Relative: 0 %
Eosinophils Absolute: 0 10*3/uL (ref 0.0–0.5)
Eosinophils Relative: 0 %
HCT: 26 % — ABNORMAL LOW (ref 39.0–52.0)
Hemoglobin: 8.5 g/dL — ABNORMAL LOW (ref 13.0–17.0)
Immature Granulocytes: 3 %
Lymphocytes Relative: 22 %
Lymphs Abs: 0.9 10*3/uL (ref 0.7–4.0)
MCH: 28.4 pg (ref 26.0–34.0)
MCHC: 32.7 g/dL (ref 30.0–36.0)
MCV: 87 fL (ref 80.0–100.0)
Monocytes Absolute: 0.9 10*3/uL (ref 0.1–1.0)
Monocytes Relative: 23 %
Neutro Abs: 2.1 10*3/uL (ref 1.7–7.7)
Neutrophils Relative %: 52 %
Platelet Count: 243 10*3/uL (ref 150–400)
RBC: 2.99 MIL/uL — ABNORMAL LOW (ref 4.22–5.81)
RDW: 14.7 % (ref 11.5–15.5)
WBC Count: 4 10*3/uL (ref 4.0–10.5)
nRBC: 0 % (ref 0.0–0.2)

## 2023-06-08 LAB — SAMPLE TO BLOOD BANK

## 2023-06-08 LAB — CMP (CANCER CENTER ONLY)
ALT: 20 U/L (ref 0–44)
AST: 34 U/L (ref 15–41)
Albumin: 3.7 g/dL (ref 3.5–5.0)
Alkaline Phosphatase: 133 U/L — ABNORMAL HIGH (ref 38–126)
Anion gap: 4 — ABNORMAL LOW (ref 5–15)
BUN: 19 mg/dL (ref 8–23)
CO2: 24 mmol/L (ref 22–32)
Calcium: 9.8 mg/dL (ref 8.9–10.3)
Chloride: 104 mmol/L (ref 98–111)
Creatinine: 1.58 mg/dL — ABNORMAL HIGH (ref 0.61–1.24)
GFR, Estimated: 47 mL/min — ABNORMAL LOW (ref >60)
Glucose, Bld: 167 mg/dL — ABNORMAL HIGH (ref 70–99)
Potassium: 4.5 mmol/L (ref 3.5–5.1)
Sodium: 132 mmol/L — ABNORMAL LOW (ref 135–145)
Total Bilirubin: 0.3 mg/dL (ref <1.2)
Total Protein: 9.5 g/dL — ABNORMAL HIGH (ref 6.5–8.1)

## 2023-06-08 MED ORDER — FOSAPREPITANT DIMEGLUMINE INJECTION 150 MG
150.0000 mg | Freq: Once | INTRAVENOUS | Status: AC
  Administered 2023-06-08: 150 mg via INTRAVENOUS
  Filled 2023-06-08: qty 150

## 2023-06-08 MED ORDER — SODIUM CHLORIDE 0.9 % IV SOLN: INTRAVENOUS | Status: DC

## 2023-06-08 MED ORDER — DEXAMETHASONE SODIUM PHOSPHATE 10 MG/ML IJ SOLN
10.0000 mg | Freq: Once | INTRAMUSCULAR | Status: AC
  Administered 2023-06-08: 10 mg via INTRAVENOUS
  Filled 2023-06-08: qty 1

## 2023-06-08 MED ORDER — SODIUM CHLORIDE 0.9 % IV SOLN
470.0000 mg | Freq: Once | INTRAVENOUS | Status: AC
  Administered 2023-06-08: 470 mg via INTRAVENOUS
  Filled 2023-06-08: qty 47

## 2023-06-08 MED ORDER — PEMETREXED DISODIUM CHEMO INJECTION 500 MG
500.0000 mg/m2 | Freq: Once | INTRAVENOUS | Status: AC
  Administered 2023-06-08: 1200 mg via INTRAVENOUS
  Filled 2023-06-08: qty 40

## 2023-06-08 MED ORDER — PALONOSETRON HCL INJECTION 0.25 MG/5ML
0.2500 mg | Freq: Once | INTRAVENOUS | Status: AC
  Administered 2023-06-08: 0.25 mg via INTRAVENOUS
  Filled 2023-06-08: qty 5

## 2023-06-08 NOTE — Patient Instructions (Signed)
 [facility] - A DEPT OF [facility]  Discharge Instructions: Thank you for choosing [facility] to provide your oncology and hematology care.   If you have a lab appointment with the Cancer Center, please go directly to the Cancer Center and check in at the registration area.   Wear comfortable clothing and clothing appropriate for easy access to any Portacath or PICC line.   We strive to give you quality time with your provider. You may need to reschedule your appointment if you arrive late (15 or more minutes).  Arriving late affects you and other patients whose appointments are after yours.  Also, if you miss three or more appointments without notifying the office, you may be dismissed from the clinic at the provider's discretion.      For prescription refill requests, have your pharmacy contact our office and allow 72 hours for refills to be completed.    Today you received the following chemotherapy and/or immunotherapy agents: Alimta/Carboplatin      To help prevent nausea and vomiting after your treatment, we encourage you to take your nausea medication as directed.  BELOW ARE SYMPTOMS THAT SHOULD BE REPORTED IMMEDIATELY: *FEVER GREATER THAN 100.4 F (38 C) OR HIGHER *CHILLS OR SWEATING *NAUSEA AND VOMITING THAT IS NOT CONTROLLED WITH YOUR NAUSEA MEDICATION *UNUSUAL SHORTNESS OF BREATH *UNUSUAL BRUISING OR BLEEDING *URINARY PROBLEMS (pain or burning when urinating, or frequent urination) *BOWEL PROBLEMS (unusual diarrhea, constipation, pain near the anus) TENDERNESS IN MOUTH AND THROAT WITH OR WITHOUT PRESENCE OF ULCERS (sore throat, sores in mouth, or a toothache) UNUSUAL RASH, SWELLING OR PAIN  UNUSUAL VAGINAL DISCHARGE OR ITCHING   Items with * indicate a potential emergency and should be followed up as soon as possible or go to the Emergency Department if any problems should occur.  Please show the CHEMOTHERAPY ALERT CARD or  IMMUNOTHERAPY ALERT CARD at check-in to the Emergency Department and triage nurse.  Should you have questions after your visit or need to cancel or reschedule your appointment, please contact [facility] - A DEPT OF [facility]  Dept: [phone]  and follow the prompts.  Office hours are 8:00 a.m. to 4:30 p.m. Monday - Friday. Please note that voicemails left after 4:00 p.m. may not be returned until the following business day.  We are closed weekends and major holidays. You have access to a nurse at all times for urgent questions. Please call the main number to the clinic Dept: [phone] and follow the prompts.   For any non-urgent questions, you may also contact your provider using MyChart. We now offer e-Visits for anyone 25 and older to request care online for non-urgent symptoms. For details visit [email or web address].   Also download the MyChart app! Go to the app store, search "MyChart", open the app, select [facility], and log in with your MyChart username and password.

## 2023-06-08 NOTE — Progress Notes (Signed)
Per Cassie adjust Carbo dose based on todays scr and weight

## 2023-06-15 ENCOUNTER — Encounter: Payer: Self-pay | Admitting: Internal Medicine

## 2023-06-15 ENCOUNTER — Inpatient Hospital Stay: Payer: Medicare Other

## 2023-06-15 ENCOUNTER — Telehealth: Payer: Self-pay

## 2023-06-15 DIAGNOSIS — N189 Chronic kidney disease, unspecified: Secondary | ICD-10-CM | POA: Diagnosis not present

## 2023-06-15 DIAGNOSIS — C3490 Malignant neoplasm of unspecified part of unspecified bronchus or lung: Secondary | ICD-10-CM

## 2023-06-15 DIAGNOSIS — C3412 Malignant neoplasm of upper lobe, left bronchus or lung: Secondary | ICD-10-CM | POA: Diagnosis not present

## 2023-06-15 DIAGNOSIS — Z5111 Encounter for antineoplastic chemotherapy: Secondary | ICD-10-CM | POA: Diagnosis not present

## 2023-06-15 LAB — CBC WITH DIFFERENTIAL (CANCER CENTER ONLY)
Abs Immature Granulocytes: 0.01 10*3/uL (ref 0.00–0.07)
Basophils Absolute: 0 10*3/uL (ref 0.0–0.1)
Basophils Relative: 0 %
Eosinophils Absolute: 0 10*3/uL (ref 0.0–0.5)
Eosinophils Relative: 1 %
HCT: 22.4 % — ABNORMAL LOW (ref 39.0–52.0)
Hemoglobin: 7.5 g/dL — ABNORMAL LOW (ref 13.0–17.0)
Immature Granulocytes: 0 %
Lymphocytes Relative: 39 %
Lymphs Abs: 0.9 10*3/uL (ref 0.7–4.0)
MCH: 28.3 pg (ref 26.0–34.0)
MCHC: 33.5 g/dL (ref 30.0–36.0)
MCV: 84.5 fL (ref 80.0–100.0)
Monocytes Absolute: 0.2 10*3/uL (ref 0.1–1.0)
Monocytes Relative: 9 %
Neutro Abs: 1.2 10*3/uL — ABNORMAL LOW (ref 1.7–7.7)
Neutrophils Relative %: 51 %
Platelet Count: 147 10*3/uL — ABNORMAL LOW (ref 150–400)
RBC: 2.65 MIL/uL — ABNORMAL LOW (ref 4.22–5.81)
RDW: 15 % (ref 11.5–15.5)
WBC Count: 2.3 10*3/uL — ABNORMAL LOW (ref 4.0–10.5)
nRBC: 0 % (ref 0.0–0.2)

## 2023-06-15 LAB — CMP (CANCER CENTER ONLY)
ALT: 22 U/L (ref 0–44)
AST: 26 U/L (ref 15–41)
Albumin: 3.4 g/dL — ABNORMAL LOW (ref 3.5–5.0)
Alkaline Phosphatase: 103 U/L (ref 38–126)
Anion gap: 3 — ABNORMAL LOW (ref 5–15)
BUN: 30 mg/dL — ABNORMAL HIGH (ref 8–23)
CO2: 26 mmol/L (ref 22–32)
Calcium: 9.3 mg/dL (ref 8.9–10.3)
Chloride: 103 mmol/L (ref 98–111)
Creatinine: 1.47 mg/dL — ABNORMAL HIGH (ref 0.61–1.24)
GFR, Estimated: 51 mL/min — ABNORMAL LOW (ref >60)
Glucose, Bld: 178 mg/dL — ABNORMAL HIGH (ref 70–99)
Potassium: 5 mmol/L (ref 3.5–5.1)
Sodium: 132 mmol/L — ABNORMAL LOW (ref 135–145)
Total Bilirubin: 0.5 mg/dL (ref <1.2)
Total Protein: 8.5 g/dL — ABNORMAL HIGH (ref 6.5–8.1)

## 2023-06-15 LAB — SAMPLE TO BLOOD BANK

## 2023-06-15 NOTE — Telephone Encounter (Signed)
Tried to contact patient in regards to hgb 7.5.  Offered patient 1 unit of blood per Cassie, PA.  Asked for a return call.

## 2023-06-16 ENCOUNTER — Encounter: Payer: Self-pay | Admitting: Physician Assistant

## 2023-06-16 ENCOUNTER — Other Ambulatory Visit: Payer: Self-pay | Admitting: Physician Assistant

## 2023-06-16 ENCOUNTER — Inpatient Hospital Stay: Payer: Medicare Other

## 2023-06-16 ENCOUNTER — Encounter: Payer: Self-pay | Admitting: Internal Medicine

## 2023-06-16 DIAGNOSIS — Z5111 Encounter for antineoplastic chemotherapy: Secondary | ICD-10-CM | POA: Diagnosis not present

## 2023-06-16 DIAGNOSIS — D6481 Anemia due to antineoplastic chemotherapy: Secondary | ICD-10-CM

## 2023-06-16 DIAGNOSIS — N189 Chronic kidney disease, unspecified: Secondary | ICD-10-CM | POA: Diagnosis not present

## 2023-06-16 DIAGNOSIS — C3492 Malignant neoplasm of unspecified part of left bronchus or lung: Secondary | ICD-10-CM

## 2023-06-16 DIAGNOSIS — C3412 Malignant neoplasm of upper lobe, left bronchus or lung: Secondary | ICD-10-CM | POA: Diagnosis not present

## 2023-06-16 LAB — PREPARE RBC (CROSSMATCH)

## 2023-06-16 MED ORDER — DIPHENHYDRAMINE HCL 25 MG PO CAPS
25.0000 mg | ORAL_CAPSULE | Freq: Once | ORAL | Status: AC
  Administered 2023-06-16: 25 mg via ORAL
  Filled 2023-06-16: qty 1

## 2023-06-16 MED ORDER — SODIUM CHLORIDE 0.9% IV SOLUTION: 250.0000 mL | INTRAVENOUS | Status: DC

## 2023-06-16 MED ORDER — ACETAMINOPHEN 325 MG PO TABS
650.0000 mg | ORAL_TABLET | Freq: Once | ORAL | Status: AC
  Administered 2023-06-16: 650 mg via ORAL
  Filled 2023-06-16: qty 2

## 2023-06-16 NOTE — Patient Instructions (Signed)

## 2023-06-17 LAB — TYPE AND SCREEN
ABO/RH(D): B POS
Antibody Screen: NEGATIVE
Unit division: 0

## 2023-06-17 LAB — BPAM RBC
Blood Product Expiration Date: 202501182359
ISSUE DATE / TIME: 202412240936
Unit Type and Rh: 7300

## 2023-06-19 ENCOUNTER — Encounter: Payer: Self-pay | Admitting: Internal Medicine

## 2023-06-19 NOTE — Assessment & Plan Note (Signed)
Chronic, uncontrolled. Goal BP<120/80.  I planned to switch him to an ARB; however, given recent conversation with ?[employer] nurse will wait until he confirms new dose of enalapril. He is advised to contact office to confirm new dose. I will make further adjustments as needed. He will continue with amlodipine as well. He agrees to rto in 2-3 weeks for NV bp check.[name]

## 2023-06-19 NOTE — Assessment & Plan Note (Signed)
Chronic, I will check labs as below.  He will continue with metformin 1000mg  twice daily, dulaglutide 1.5mg  weekly and Lantus 21 units sq nightly. I will adjust meds as needed.  He will rto in four months for re-evaluation.

## 2023-06-22 ENCOUNTER — Inpatient Hospital Stay: Payer: Medicare Other

## 2023-06-22 ENCOUNTER — Encounter: Payer: Self-pay | Admitting: Internal Medicine

## 2023-06-22 DIAGNOSIS — C3490 Malignant neoplasm of unspecified part of unspecified bronchus or lung: Secondary | ICD-10-CM

## 2023-06-22 DIAGNOSIS — C3412 Malignant neoplasm of upper lobe, left bronchus or lung: Secondary | ICD-10-CM | POA: Diagnosis not present

## 2023-06-22 DIAGNOSIS — N189 Chronic kidney disease, unspecified: Secondary | ICD-10-CM | POA: Diagnosis not present

## 2023-06-22 DIAGNOSIS — Z5111 Encounter for antineoplastic chemotherapy: Secondary | ICD-10-CM | POA: Diagnosis not present

## 2023-06-22 LAB — CBC WITH DIFFERENTIAL (CANCER CENTER ONLY)
Abs Immature Granulocytes: 0.03 10*3/uL (ref 0.00–0.07)
Basophils Absolute: 0 10*3/uL (ref 0.0–0.1)
Basophils Relative: 0 %
Eosinophils Absolute: 0 10*3/uL (ref 0.0–0.5)
Eosinophils Relative: 1 %
HCT: 24.9 % — ABNORMAL LOW (ref 39.0–52.0)
Hemoglobin: 8.2 g/dL — ABNORMAL LOW (ref 13.0–17.0)
Immature Granulocytes: 1 %
Lymphocytes Relative: 31 %
Lymphs Abs: 1.2 10*3/uL (ref 0.7–4.0)
MCH: 28.1 pg (ref 26.0–34.0)
MCHC: 32.9 g/dL (ref 30.0–36.0)
MCV: 85.3 fL (ref 80.0–100.0)
Monocytes Absolute: 0.4 10*3/uL (ref 0.1–1.0)
Monocytes Relative: 10 %
Neutro Abs: 2.2 10*3/uL (ref 1.7–7.7)
Neutrophils Relative %: 57 %
Platelet Count: 99 10*3/uL — ABNORMAL LOW (ref 150–400)
RBC: 2.92 MIL/uL — ABNORMAL LOW (ref 4.22–5.81)
RDW: 16 % — ABNORMAL HIGH (ref 11.5–15.5)
WBC Count: 3.8 10*3/uL — ABNORMAL LOW (ref 4.0–10.5)
nRBC: 0 % (ref 0.0–0.2)

## 2023-06-22 LAB — CMP (CANCER CENTER ONLY)
ALT: 22 U/L (ref 0–44)
AST: 28 U/L (ref 15–41)
Albumin: 3.3 g/dL — ABNORMAL LOW (ref 3.5–5.0)
Alkaline Phosphatase: 119 U/L (ref 38–126)
Anion gap: 4 — ABNORMAL LOW (ref 5–15)
BUN: 22 mg/dL (ref 8–23)
CO2: 23 mmol/L (ref 22–32)
Calcium: 9.4 mg/dL (ref 8.9–10.3)
Chloride: 103 mmol/L (ref 98–111)
Creatinine: 1.5 mg/dL — ABNORMAL HIGH (ref 0.61–1.24)
GFR, Estimated: 50 mL/min — ABNORMAL LOW (ref >60)
Glucose, Bld: 158 mg/dL — ABNORMAL HIGH (ref 70–99)
Potassium: 4.5 mmol/L (ref 3.5–5.1)
Sodium: 130 mmol/L — ABNORMAL LOW (ref 135–145)
Total Bilirubin: 0.4 mg/dL (ref 0.0–1.2)
Total Protein: 9.4 g/dL — ABNORMAL HIGH (ref 6.5–8.1)

## 2023-06-22 LAB — SAMPLE TO BLOOD BANK

## 2023-06-23 ENCOUNTER — Other Ambulatory Visit: Payer: Self-pay

## 2023-06-23 ENCOUNTER — Ambulatory Visit: Payer: Medicare Other

## 2023-06-23 VITALS — BP 134/76 | Temp 98.1°F | Ht 75.0 in | Wt 235.0 lb

## 2023-06-23 DIAGNOSIS — I129 Hypertensive chronic kidney disease with stage 1 through stage 4 chronic kidney disease, or unspecified chronic kidney disease: Secondary | ICD-10-CM

## 2023-06-23 MED ORDER — ENALAPRIL MALEATE 20 MG PO TABS: ORAL_TABLET | ORAL | 1 refills | Status: DC

## 2023-06-23 MED ORDER — BD INTEGRA SYRINGE 25G X 5/8" 3 ML MISC: 24 refills | Status: AC

## 2023-06-23 MED ORDER — ONETOUCH VERIO VI STRP: ORAL_STRIP | 2 refills | Status: DC

## 2023-06-23 NOTE — Progress Notes (Signed)
 Patient presents today for bpc. He currently takes Amloidpine 10MG  at night & Enalapril  20MG  in the morning. Denies headache, chest pain & sob. He admits not currently exercising. He reports drinking a quart and a half of water a day. Initial bp: 154/80.  BP Readings from Last 3 Encounters:  06/23/23 134/76  06/16/23 123/70  06/08/23 (!) 164/78  Per provider patient is to strive for BP less than 120/80. Increase Enalapril  to 20mg  twice daily. Patient aware. Appointment scheduled for 2 weeks for bp follow up & BMP. Patient aware to cut back on salty things bread, cheeses, chips & crackers.

## 2023-06-23 NOTE — Assessment & Plan Note (Signed)
Stage Iia.  He is s/p robotic assisted left video thoracoscopy w/ left upr lobectomy and mediastinal lymph node sampling. He is now on chemotherapy every 3 weeks. Oncology input is appreciated.

## 2023-06-23 NOTE — Patient Instructions (Signed)
 Hypertension, Adult Hypertension is another name for high blood pressure. High blood pressure forces your heart to work harder to pump blood. This can cause problems over time. There are two numbers in a blood pressure reading. There is a top number (systolic) over a bottom number (diastolic). It is best to have a blood pressure that is below 120/80. What are the causes? The cause of this condition is not known. Some other conditions can lead to high blood pressure. What increases the risk? Some lifestyle factors can make you more likely to develop high blood pressure: Smoking. Not getting enough exercise or physical activity. Being overweight. Having too much fat, sugar, calories, or salt (sodium) in your diet. Drinking too much alcohol. Other risk factors include: Having any of these conditions: Heart disease. Diabetes. High cholesterol. Kidney disease. Obstructive sleep apnea. Having a family history of high blood pressure and high cholesterol. Age. The risk increases with age. Stress. What are the signs or symptoms? High blood pressure may not cause symptoms. Very high blood pressure (hypertensive crisis) may cause: Headache. Fast or uneven heartbeats (palpitations). Shortness of breath. Nosebleed. Vomiting or feeling like you may vomit (nauseous). Changes in how you see. Very bad chest pain. Feeling dizzy. Seizures. How is this treated? This condition is treated by making healthy lifestyle changes, such as: Eating healthy foods. Exercising more. Drinking less alcohol. Your doctor may prescribe medicine if lifestyle changes do not help enough and if: Your top number is above 130. Your bottom number is above 80. Your personal target blood pressure may vary. Follow these instructions at home: Eating and drinking  If told, follow the DASH eating plan. To follow this plan: Fill one half of your plate at each meal with fruits and vegetables. Fill one fourth of your plate  at each meal with whole grains. Whole grains include whole-wheat pasta, brown rice, and whole-grain bread. Eat or drink low-fat dairy products, such as skim milk or low-fat yogurt. Fill one fourth of your plate at each meal with low-fat (lean) proteins. Low-fat proteins include fish, chicken without skin, eggs, beans, and tofu. Avoid fatty meat, cured and processed meat, or chicken with skin. Avoid pre-made or processed food. Limit the amount of salt in your diet to less than 1,500 mg each day. Do not drink alcohol if: Your doctor tells you not to drink. You are pregnant, may be pregnant, or are planning to become pregnant. If you drink alcohol: Limit how much you have to: 0-1 drink a day for women. 0-2 drinks a day for men. Know how much alcohol is in your drink. In the [place] one drink equals one 12 oz bottle of beer (355 mL), one 5 oz glass of wine (148 mL), or one 1 oz glass of hard liquor (44 mL). Lifestyle  Work with your doctor to stay at a healthy weight or to lose weight. Ask your doctor what the best weight is for you. Get at least 30 minutes of exercise that causes your heart to beat faster (aerobic exercise) most days of the week. This may include walking, swimming, or biking. Get at least 30 minutes of exercise that strengthens your muscles (resistance exercise) at least 3 days a week. This may include lifting weights or doing Pilates. Do not smoke or use any products that contain nicotine or tobacco. If you need help quitting, ask your doctor. Check your blood pressure at home as told by your doctor. Keep all follow-up visits. Medicines Take over-the-counter and prescription medicines  only as told by your doctor. Follow directions carefully. Do not skip doses of blood pressure medicine. The medicine does not work as well if you skip doses. Skipping doses also puts you at risk for problems. Ask your doctor about side effects or reactions to medicines that you should watch  for. Contact a doctor if: You think you are having a reaction to the medicine you are taking. You have headaches that keep coming back. You feel dizzy. You have swelling in your ankles. You have trouble with your vision. Get help right away if: You get a very bad headache. You start to feel mixed up (confused). You feel weak or numb. You feel faint. You have very bad pain in your: Chest. Belly (abdomen). You vomit more than once. You have trouble breathing. These symptoms may be an emergency. Get help right away. Call 911. Do not wait to see if the symptoms will go away. Do not drive yourself to the hospital. Summary Hypertension is another name for high blood pressure. High blood pressure forces your heart to work harder to pump blood. For most people, a normal blood pressure is less than 120/80. Making healthy choices can help lower blood pressure. If your blood pressure does not get lower with healthy choices, you may need to take medicine. This information is not intended to replace advice given to you by your health care provider. Make sure you discuss any questions you have with your health care provider. Document Revised: 03/28/2021 Document Reviewed: 03/28/2021 Elsevier Patient Education  2024 [employer].

## 2023-06-23 NOTE — Assessment & Plan Note (Addendum)
He has been referred to GI for further evaluation. He is scheduled for colonoscopy for further evaluation.

## 2023-06-24 ENCOUNTER — Emergency Department (HOSPITAL_COMMUNITY): Payer: Medicare Other

## 2023-06-24 ENCOUNTER — Inpatient Hospital Stay (HOSPITAL_COMMUNITY)
Admission: EM | Admit: 2023-06-24 | Discharge: 2023-06-28 | DRG: 270 | Disposition: A | Discharge status: Home / Self Care | Payer: Medicare Other | Attending: Internal Medicine | Admitting: Internal Medicine

## 2023-06-24 ENCOUNTER — Emergency Department (HOSPITAL_BASED_OUTPATIENT_CLINIC_OR_DEPARTMENT_OTHER): Payer: Medicare Other

## 2023-06-24 ENCOUNTER — Other Ambulatory Visit: Payer: Self-pay

## 2023-06-24 DIAGNOSIS — I2699 Other pulmonary embolism without acute cor pulmonale: Secondary | ICD-10-CM | POA: Diagnosis not present

## 2023-06-24 DIAGNOSIS — M79662 Pain in left lower leg: Secondary | ICD-10-CM

## 2023-06-24 DIAGNOSIS — I10 Essential (primary) hypertension: Secondary | ICD-10-CM | POA: Diagnosis not present

## 2023-06-24 DIAGNOSIS — N1831 Chronic kidney disease, stage 3a: Secondary | ICD-10-CM | POA: Diagnosis present

## 2023-06-24 DIAGNOSIS — E1122 Type 2 diabetes mellitus with diabetic chronic kidney disease: Secondary | ICD-10-CM | POA: Diagnosis present

## 2023-06-24 DIAGNOSIS — Z7901 Long term (current) use of anticoagulants: Secondary | ICD-10-CM | POA: Diagnosis not present

## 2023-06-24 DIAGNOSIS — C3492 Malignant neoplasm of unspecified part of left bronchus or lung: Secondary | ICD-10-CM | POA: Diagnosis not present

## 2023-06-24 DIAGNOSIS — I82412 Acute embolism and thrombosis of left femoral vein: Secondary | ICD-10-CM | POA: Diagnosis not present

## 2023-06-24 DIAGNOSIS — N183 Chronic kidney disease, stage 3 unspecified: Secondary | ICD-10-CM | POA: Diagnosis not present

## 2023-06-24 DIAGNOSIS — I871 Compression of vein: Secondary | ICD-10-CM | POA: Diagnosis present

## 2023-06-24 DIAGNOSIS — Z807 Family history of other malignant neoplasms of lymphoid, hematopoietic and related tissues: Secondary | ICD-10-CM | POA: Diagnosis not present

## 2023-06-24 DIAGNOSIS — I2602 Saddle embolus of pulmonary artery with acute cor pulmonale: Secondary | ICD-10-CM | POA: Diagnosis not present

## 2023-06-24 DIAGNOSIS — C3412 Malignant neoplasm of upper lobe, left bronchus or lung: Secondary | ICD-10-CM | POA: Diagnosis not present

## 2023-06-24 DIAGNOSIS — I82422 Acute embolism and thrombosis of left iliac vein: Secondary | ICD-10-CM | POA: Diagnosis not present

## 2023-06-24 DIAGNOSIS — Z794 Long term (current) use of insulin: Secondary | ICD-10-CM | POA: Diagnosis not present

## 2023-06-24 DIAGNOSIS — Z79899 Other long term (current) drug therapy: Secondary | ICD-10-CM

## 2023-06-24 DIAGNOSIS — Z7982 Long term (current) use of aspirin: Secondary | ICD-10-CM | POA: Diagnosis not present

## 2023-06-24 DIAGNOSIS — D630 Anemia in neoplastic disease: Secondary | ICD-10-CM | POA: Diagnosis not present

## 2023-06-24 DIAGNOSIS — E11649 Type 2 diabetes mellitus with hypoglycemia without coma: Secondary | ICD-10-CM | POA: Diagnosis not present

## 2023-06-24 DIAGNOSIS — Z833 Family history of diabetes mellitus: Secondary | ICD-10-CM

## 2023-06-24 DIAGNOSIS — I129 Hypertensive chronic kidney disease with stage 1 through stage 4 chronic kidney disease, or unspecified chronic kidney disease: Secondary | ICD-10-CM | POA: Diagnosis present

## 2023-06-24 DIAGNOSIS — E7800 Pure hypercholesterolemia, unspecified: Secondary | ICD-10-CM | POA: Diagnosis not present

## 2023-06-24 DIAGNOSIS — N182 Chronic kidney disease, stage 2 (mild): Secondary | ICD-10-CM | POA: Diagnosis present

## 2023-06-24 DIAGNOSIS — I82492 Acute embolism and thrombosis of other specified deep vein of left lower extremity: Secondary | ICD-10-CM | POA: Diagnosis not present

## 2023-06-24 DIAGNOSIS — M7989 Other specified soft tissue disorders: Secondary | ICD-10-CM | POA: Diagnosis not present

## 2023-06-24 DIAGNOSIS — J948 Other specified pleural conditions: Secondary | ICD-10-CM | POA: Diagnosis not present

## 2023-06-24 DIAGNOSIS — Z902 Acquired absence of lung [part of]: Secondary | ICD-10-CM | POA: Diagnosis not present

## 2023-06-24 DIAGNOSIS — Z8249 Family history of ischemic heart disease and other diseases of the circulatory system: Secondary | ICD-10-CM

## 2023-06-24 DIAGNOSIS — E119 Type 2 diabetes mellitus without complications: Secondary | ICD-10-CM

## 2023-06-24 DIAGNOSIS — I2694 Multiple subsegmental pulmonary emboli without acute cor pulmonale: Secondary | ICD-10-CM | POA: Diagnosis not present

## 2023-06-24 DIAGNOSIS — J90 Pleural effusion, not elsewhere classified: Secondary | ICD-10-CM | POA: Diagnosis not present

## 2023-06-24 LAB — CBC
HCT: 24.5 % — ABNORMAL LOW (ref 39.0–52.0)
Hemoglobin: 7.9 g/dL — ABNORMAL LOW (ref 13.0–17.0)
MCH: 27.7 pg (ref 26.0–34.0)
MCHC: 32.2 g/dL (ref 30.0–36.0)
MCV: 86 fL (ref 80.0–100.0)
Platelets: 134 10*3/uL — ABNORMAL LOW (ref 150–400)
RBC: 2.85 MIL/uL — ABNORMAL LOW (ref 4.22–5.81)
RDW: 16.1 % — ABNORMAL HIGH (ref 11.5–15.5)
WBC: 2.9 10*3/uL — ABNORMAL LOW (ref 4.0–10.5)
nRBC: 0 % (ref 0.0–0.2)

## 2023-06-24 LAB — BASIC METABOLIC PANEL
Anion gap: 9 (ref 5–15)
BUN: 21 mg/dL (ref 8–23)
CO2: 21 mmol/L — ABNORMAL LOW (ref 22–32)
Calcium: 9.2 mg/dL (ref 8.9–10.3)
Chloride: 103 mmol/L (ref 98–111)
Creatinine, Ser: 1.81 mg/dL — ABNORMAL HIGH (ref 0.61–1.24)
GFR, Estimated: 40 mL/min — ABNORMAL LOW (ref >60)
Glucose, Bld: 231 mg/dL — ABNORMAL HIGH (ref 70–99)
Potassium: 5 mmol/L (ref 3.5–5.1)
Sodium: 133 mmol/L — ABNORMAL LOW (ref 135–145)

## 2023-06-24 LAB — TROPONIN I (HIGH SENSITIVITY)
Troponin I (High Sensitivity): 9 ng/L (ref <18)
Troponin I (High Sensitivity): 11 ng/L (ref <18)

## 2023-06-24 MED ORDER — OXYCODONE-ACETAMINOPHEN 5-325 MG PO TABS
1.0000 | ORAL_TABLET | Freq: Once | ORAL | Status: AC
  Administered 2023-06-24: 1 via ORAL
  Filled 2023-06-24: qty 1

## 2023-06-24 MED ORDER — IOHEXOL 350 MG/ML SOLN
60.0000 mL | Freq: Once | INTRAVENOUS | Status: AC | PRN
  Administered 2023-06-24: 60 mL via INTRAVENOUS

## 2023-06-24 MED ORDER — ENOXAPARIN SODIUM 100 MG/ML IJ SOSY
100.0000 mg | PREFILLED_SYRINGE | Freq: Two times a day (BID) | INTRAMUSCULAR | Status: DC
  Administered 2023-06-24 – 2023-06-25 (×3): 100 mg via SUBCUTANEOUS
  Filled 2023-06-24 (×4): qty 1

## 2023-06-24 MED ORDER — ONDANSETRON HCL 4 MG PO TABS: 4.0000 mg | ORAL_TABLET | Freq: Four times a day (QID) | ORAL | Status: DC | PRN

## 2023-06-24 MED ORDER — ACETAMINOPHEN 325 MG PO TABS: 650.0000 mg | ORAL_TABLET | Freq: Four times a day (QID) | ORAL | Status: DC | PRN

## 2023-06-24 MED ORDER — ACETAMINOPHEN 650 MG RE SUPP: 650.0000 mg | Freq: Four times a day (QID) | RECTAL | Status: DC | PRN

## 2023-06-24 MED ORDER — ONDANSETRON HCL 4 MG/2ML IJ SOLN: 4.0000 mg | Freq: Four times a day (QID) | INTRAMUSCULAR | Status: DC | PRN

## 2023-06-24 MED ORDER — OXYCODONE-ACETAMINOPHEN 5-325 MG PO TABS
1.0000 | ORAL_TABLET | ORAL | Status: DC | PRN
  Administered 2023-06-25: 2 via ORAL
  Filled 2023-06-24: qty 2

## 2023-06-24 MED ORDER — SODIUM CHLORIDE 0.9 % IV BOLUS
500.0000 mL | Freq: Once | INTRAVENOUS | Status: AC
  Administered 2023-06-24: 500 mL via INTRAVENOUS

## 2023-06-24 NOTE — Progress Notes (Signed)
 PHARMACY - ANTICOAGULATION CONSULT NOTE  Pharmacy Consult for enoxaparin  Indication: pulmonary embolus and DVT  No Known Allergies  Patient Measurements:   Heparin  Dosing Weight: n/a  Vital Signs: Temp: 99.1 F (37.3 C) (01/01 1815) BP: 153/78 (01/01 2030) Pulse Rate: 85 (01/01 2030)  Labs: Recent Labs    06/22/23 1142 06/24/23 1546 06/24/23 1852  HGB 8.2* 7.9*  --   HCT 24.9* 24.5*  --   PLT 99* 134*  --   CREATININE 1.50* 1.81*  --   TROPONINIHS  --   --  9    Estimated Creatinine Clearance: 50.8 mL/min (A) (by C-G formula based on SCr of 1.81 mg/dL (H)).   Medical History: Past Medical History:  Diagnosis Date   Diabetes mellitus    High cholesterol    Hypertension    Assessment: 68 yo M with small PE and L DVT. Pt undergoing chemotherapy. Pharmacy consulted for enoxaparin  dosing.   Hgb 7.9, Plt 134  Goal of Therapy:  Monitor platelets by anticoagulation protocol: Yes   Plan:  Enoxaparin  1mg /kg q12h (100mg  SQ q12h)  F/u Pltc, long term anticoag plans, and renal function  Sharyne Glatter, PharmD, BCCCP Clinical Pharmacist 06/24/2023 8:47 PM

## 2023-06-24 NOTE — Progress Notes (Signed)
 LLE venous duplex has been completed.  Preliminary findings given to Dr. Rodena Medin.    Results can be found under chart review under CV PROC. 06/24/2023 5:58 PM [name] RVT, RDMS

## 2023-06-24 NOTE — ED Triage Notes (Signed)
 Patient arrives for eval of LLE swelling and pain since yesterday. Most recent chemo treatment 12/16. Denies new shortness of breath. Denies recent long travel.

## 2023-06-24 NOTE — ED Provider Notes (Signed)
  EMERGENCY DEPARTMENT AT [facility] Provider Note   CSN: [id number] Arrival date & time: 06/24/23  1522     History  Chief Complaint  Patient presents with   Leg Swelling    William Mcguire is a 68 y.o. male,history of lung cancer, is currently undergoing chemotherapy, who presents to the ED secondary to left leg swelling, pain, that is been going on for the last 3 days.  He states it started on Monday, and has progressively gotten worse, and is went from his calf to his thigh.  Denies any numbness, tingling, redness of the leg.  But states is very swollen.  Also states he is having a little bit of shortness of breath, but denies any chest pain.  Wife states that shortness of breath is new.    Home Medications Prior to Admission medications   Medication Sig Start Date End Date Taking? Authorizing Provider  amLODipine  (NORVASC ) 10 MG tablet TAKE 1 TABLET BY MOUTH EVERY DAY 03/18/23   Jarold Medici, MD  aspirin  EC 81 MG tablet Take 81 mg by mouth daily. Swallow whole.    [provider]  atorvastatin  (LIPITOR) 40 MG tablet TAKE 1 TABLET BY MOUTH EVERY DAY 03/25/23   Jarold Medici, MD  chlorproMAZINE  (THORAZINE ) 25 MG tablet Take 1 tablet (25 mg total) by mouth 3 (three) times daily as needed. 04/29/23   Heilingoetter, Cassandra L, PA-C  cyanocobalamin  (VITAMIN B12) 1000 MCG/ML injection INJECT 1 ML (1,000 MCG TOTAL) INTO THE MUSCLE EVERY 30 DAYS. 12/10/22   Jarold Medici, MD  dexamethasone  (DECADRON ) 4 MG tablet Take 1 tablet twice a day the day before, the day of, and the day after chemotherapy 04/13/23   Heilingoetter, Cassandra L, PA-C  enalapril  (VASOTEC ) 20 MG tablet TAKE 1 TABLET TWICE DAILY. 06/23/23   Jarold Medici, MD  folic acid  (FOLVITE ) 1 MG tablet Take 1 tablet (1 mg total) by mouth daily. 04/13/23   Heilingoetter, Cassandra L, PA-C  glucose blood (ONETOUCH VERIO) test strip Use as instructed to check blood sugars twice daily E11.69 06/23/23    Jarold Medici, MD  glucose blood test strip Use as instructed to test blood sugar 3 times a day. Dx code: e11.65 11/26/21   Jarold Medici, MD  insulin  glargine (LANTUS ) 100 UNIT/ML injection Inject 21 Units into the skin at bedtime.    [provider]  ondansetron  (ZOFRAN ) 8 MG tablet Take 1 tablet (8 mg total) by mouth every 8 (eight) hours as needed for nausea or vomiting. Starting day 3 after chemotherapy 04/29/23   Heilingoetter, Cassandra L, PA-C  oxyCODONE  (OXY IR/ROXICODONE ) 5 MG immediate release tablet Take 1 tablet (5 mg total) by mouth every 6 (six) hours as needed for moderate pain. 03/23/23   Stehler, Con BROCKS, PA-C  prochlorperazine  (COMPAZINE ) 10 MG tablet Take 1 tablet (10 mg total) by mouth every 6 (six) hours as needed. 05/06/23   Heilingoetter, Cassandra L, PA-C  sildenafil  (VIAGRA ) 100 MG tablet TAKE 1 TABLET BY MOUTH EVERY DAY AS NEEDED (INSURANCE COVERS 10 TAB PER 77DAYS) 02/13/23   Jarold Medici, MD  SYRINGE-NEEDLE, DISP, 3 ML (B-D INTEGRA SYRINGE) 25G X 5/8 3 ML MISC USE AS DIRECTED 06/23/23   Jarold Medici, MD      Allergies    Patient has no known allergies.    Review of Systems   Review of Systems  Respiratory:  Positive for shortness of breath.   Cardiovascular:  Positive for leg swelling. Negative for chest pain.  Physical Exam Updated Vital Signs BP (!) 153/78   Pulse 85   Temp 99.1 F (37.3 C)   Resp 16   SpO2 100%  Physical Exam Vitals and nursing note reviewed.  Constitutional:      General: He is not in acute distress.    Appearance: He is well-developed.  HENT:     Head: Normocephalic and atraumatic.  Eyes:     Conjunctiva/sclera: Conjunctivae normal.  Cardiovascular:     Rate and Rhythm: Normal rate and regular rhythm.     Heart sounds: No murmur heard. Pulmonary:     Effort: Pulmonary effort is normal. No respiratory distress.     Breath sounds: Normal breath sounds.  Abdominal:     Palpations: Abdomen is soft.      Tenderness: There is no abdominal tenderness.  Musculoskeletal:        General: No swelling.     Cervical back: Neck supple.     Comments: Tenderness to palpation of left lower extremity, with 1+ pitting edema.  Tenderness to palpation specifically, and left groin.  Positive dorsalis pedis pulse.  No overlying skin changes  Skin:    General: Skin is warm and dry.     Capillary Refill: Capillary refill takes less than 2 seconds.  Neurological:     Mental Status: He is alert.  Psychiatric:        Mood and Affect: Mood normal.     ED Results / Procedures / Treatments   Labs (all labs ordered are listed, but only abnormal results are displayed) Labs Reviewed  CBC - Abnormal; Notable for the following components:      Result Value   WBC 2.9 (*)    RBC 2.85 (*)    Hemoglobin 7.9 (*)    HCT 24.5 (*)    RDW 16.1 (*)    Platelets 134 (*)    All other components within normal limits  BASIC METABOLIC PANEL - Abnormal; Notable for the following components:   Sodium 133 (*)    CO2 21 (*)    Glucose, Bld 231 (*)    Creatinine, Ser 1.81 (*)    GFR, Estimated 40 (*)    All other components within normal limits  TROPONIN I (HIGH SENSITIVITY)  TROPONIN I (HIGH SENSITIVITY)    EKG None  Radiology CT Angio Chest PE W and/or Wo Contrast Result Date: 06/24/2023 CLINICAL DATA:  Pulmonary embolism (PE) suspected, high prob Left lung cancer. EXAM: CT ANGIOGRAPHY CHEST WITH CONTRAST TECHNIQUE: Multidetector CT imaging of the chest was performed using the standard protocol during bolus administration of intravenous contrast. Multiplanar CT image reconstructions and MIPs were obtained to evaluate the vascular anatomy. RADIATION DOSE REDUCTION: This exam was performed according to the departmental dose-optimization program which includes automated exposure control, adjustment of the mA and/or kV according to patient size and/or use of iterative reconstruction technique. CONTRAST:  60mL OMNIPAQUE   IOHEXOL  350 MG/ML SOLN COMPARISON:  Radiograph 05/05/2023, CT 03/26/2023 FINDINGS: Cardiovascular: Multilobar acute segmental pulmonary emboli in the right lung. Thromboembolic burden is William Mcguire to moderate. There is no right heart strain. The RV to LV ratio is 0.79. The heart is normal in size. Thoracic aorta is normal in caliber. No acute aortic findings. Common origin of brachiocephalic and left common carotid artery. No pericardial effusion. Mediastinum/Nodes: Postsurgical change at the left hilum. No enlarged mediastinal or hilar lymph nodes. Thyroid  nodules are less well-defined on the current exam given phase of contrast. Patulous esophagus without wall thickening. 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 The previous hydropneumothorax has improved. Octavis Sheeler amount of pleural fluid persists including a Candid Bovey loculated components in the anterior upper lobe. No definite pleural thickening/enhancement. Clustered left lung nodule series 6, image 51, are unchanged from prior exam. No evidence of pulmonary infarct or focal right lung opacity. No right pleural effusion. Upper Abdomen: No acute findings. Musculoskeletal: Diffuse thoracic spondylosis with anterior spurring. No evidence of focal bone lesion. Minimal bilateral gynecomastia, more so on the right. Review of the MIP images confirms the above findings. IMPRESSION: 1. Multilobar acute segmental pulmonary emboli in the right lung. Thromboembolic burden is Denis Carreon to moderate. No right heart strain. 2. Postsurgical change of left upper lobectomy. The previous hydropneumothorax has improved. Yuktha Kerchner amount of pleural fluid persists including a Markas Aldredge loculated components in the anterior upper lobe. 3. Clustered left lung nodules are unchanged from prior exam. Recommend attention at follow-up. Critical Value/emergent results were called by telephone at the time of interpretation on 06/24/2023  at 8:35 pm to provider Rowen Wilmer , who verbally acknowledged these results. Electronically Signed   By: Andrea Gasman M.D.   On: 06/24/2023 20:37   VAS US  LOWER EXTREMITY VENOUS (DVT) (ONLY [facility] & [facility]) Result Date: 06/24/2023  Lower Venous DVT Study Patient Name:  William Mcguire  Date of Exam:   06/24/2023 Medical Rec #: [id number]        Accession #:    [id number] Date of Birth: 1955/11/04        Patient Gender: M Patient Age:   1 years Exam Location:  [facility] Procedure:      VAS US  LOWER EXTREMITY VENOUS (DVT) Referring Phys: DAN FLOYD --------------------------------------------------------------------------------  Indications: Swelling. Other Indications: Shortness of breath when using stairs. Risk Factors: Lung cancer / chemotherapy. Comparison Study: No previous exams Performing Technologist: Jody Hill RVT, RDMS  Examination Guidelines: A complete evaluation includes B-mode imaging, spectral Doppler, color Doppler, and power Doppler as needed of all accessible portions of each vessel. Bilateral testing is considered an integral part of a complete examination. Limited examinations for reoccurring indications may be performed as noted. The reflux portion of the exam is performed with the patient in reverse Trendelenburg.  +-----+---------------+---------+-----------+----------+--------------+ RIGHTCompressibilityPhasicitySpontaneityPropertiesThrombus Aging +-----+---------------+---------+-----------+----------+--------------+ CFV  Full           Yes      Yes                                 +-----+---------------+---------+-----------+----------+--------------+   +--------+---------------+---------+-----------+----------+--------------------+ LEFT    CompressibilityPhasicitySpontaneityPropertiesThrombus Aging       +--------+---------------+---------+-----------+----------+--------------------+ CFV     None                                                               +--------+---------------+---------+-----------+----------+--------------------+ SFJ     None                                                              +--------+---------------+---------+-----------+----------+--------------------+ FV Prox None  No       No                                        +--------+---------------+---------+-----------+----------+--------------------+ FV Mid  None           No       No                                        +--------+---------------+---------+-----------+----------+--------------------+ FV      None           No       No                                        Distal                                                                    +--------+---------------+---------+-----------+----------+--------------------+ PFV     None           No       No                                        +--------+---------------+---------+-----------+----------+--------------------+ POP     None           No       No                                        +--------+---------------+---------+-----------+----------+--------------------+ PTV     Full                                                              +--------+---------------+---------+-----------+----------+--------------------+ PERO    Full                                                              +--------+---------------+---------+-----------+----------+--------------------+ GSV     None           No       No                   Acute (origin /  prox)                +--------+---------------+---------+-----------+----------+--------------------+ EIV     None           No       No                   Acute - distal                                                            portion              +--------+---------------+---------+-----------+----------+--------------------+  Partial appearing thrombus in mid EIV   Summary: RIGHT: - No evidence of common femoral vein obstruction.   LEFT: - Findings consistent with acute deep vein thrombosis involving the left common femoral vein, SF junction, left femoral vein, left proximal profunda vein, left popliteal vein, and external iliac (mid and distal).  No cystic structure found in the popliteal fossa. Subcutaneous edema noted in calf and ankle.  *See table(s) above for measurements and observations.    Preliminary     Procedures Procedures    Medications Ordered in ED Medications  enoxaparin  (LOVENOX ) injection 100 mg (100 mg Subcutaneous Given 06/24/23 2059)  oxyCODONE -acetaminophen  (PERCOCET/ROXICET) 5-325 MG per tablet 1 tablet (has no administration in time range)  sodium chloride  0.9 % bolus 500 mL (0 mLs Intravenous Stopped 06/24/23 2016)  iohexol  (OMNIPAQUE ) 350 MG/ML injection 60 mL (60 mLs Intravenous Contrast Given 06/24/23 2012)    ED Course/ Medical Decision Making/ A&P                               Medical Decision Making Patient is a 68 year old male, overall well-appearing, with a history of lung cancer, currently going through chemotherapy, who is here for left lower extremity swelling, has been going on for the last few days.  We will obtain a DVT study as well as a CTA, as he is not having new shortness of breath, he has increased risk factors such as known cancer.  Will also obtain troponin, do evaluate for any kind of heart strain  Amount and/or Complexity of Data Reviewed Labs: ordered.    Details: Troponin within normal limits, mild elevation of the creatinine Radiology: ordered. Decision-making details documented in ED Course.    Details: Findings consistent of acute DVT, involving the left common femoral, SF junction, left proximal profunda, left femoral, left popliteal, and external iliac vein.  Multi lobar pulmonary emboli, the right lung Discussion of management or test interpretation with  external provider(s): Patient is a 68 year old male, found with a large acute DVT, the left leg, and pulmonary emboli, of the right lung, multilobar.  He is a cancer patient, we will give him Lovenox , admitted to hospitalist.  Discussed with Dr. Ula.  Admitted to Dr. Lonzell  Risk Prescription drug management. Decision regarding hospitalization.   Final Clinical Impression(s) / ED Diagnoses Final diagnoses:  Acute deep vein thrombosis (DVT) of femoral vein of left lower extremity (HCC)  Multiple subsegmental pulmonary emboli without acute cor pulmonale [facility])    Rx / DC Orders ED Discharge Orders     None         Philippa Lyle CROME, [place] 06/24/23 2122  Ula Prentice SAUNDERS, MD 06/24/23 [phone]

## 2023-06-25 ENCOUNTER — Telehealth (HOSPITAL_COMMUNITY): Payer: Self-pay | Admitting: Pharmacy Technician

## 2023-06-25 ENCOUNTER — Other Ambulatory Visit (HOSPITAL_COMMUNITY): Payer: Self-pay

## 2023-06-25 ENCOUNTER — Encounter: Payer: Self-pay | Admitting: Physician Assistant

## 2023-06-25 ENCOUNTER — Observation Stay (HOSPITAL_COMMUNITY): Payer: Medicare Other

## 2023-06-25 ENCOUNTER — Encounter (HOSPITAL_COMMUNITY): Payer: Self-pay | Admitting: Internal Medicine

## 2023-06-25 ENCOUNTER — Other Ambulatory Visit (HOSPITAL_COMMUNITY): Payer: Medicare Other

## 2023-06-25 ENCOUNTER — Encounter: Payer: Self-pay | Admitting: Internal Medicine

## 2023-06-25 DIAGNOSIS — I10 Essential (primary) hypertension: Secondary | ICD-10-CM | POA: Diagnosis not present

## 2023-06-25 DIAGNOSIS — I2602 Saddle embolus of pulmonary artery with acute cor pulmonale: Secondary | ICD-10-CM | POA: Diagnosis not present

## 2023-06-25 DIAGNOSIS — Z794 Long term (current) use of insulin: Secondary | ICD-10-CM

## 2023-06-25 DIAGNOSIS — I2699 Other pulmonary embolism without acute cor pulmonale: Secondary | ICD-10-CM | POA: Diagnosis not present

## 2023-06-25 DIAGNOSIS — N183 Chronic kidney disease, stage 3 unspecified: Secondary | ICD-10-CM

## 2023-06-25 DIAGNOSIS — C3492 Malignant neoplasm of unspecified part of left bronchus or lung: Secondary | ICD-10-CM | POA: Diagnosis not present

## 2023-06-25 DIAGNOSIS — E1122 Type 2 diabetes mellitus with diabetic chronic kidney disease: Secondary | ICD-10-CM

## 2023-06-25 DIAGNOSIS — N1831 Chronic kidney disease, stage 3a: Secondary | ICD-10-CM

## 2023-06-25 DIAGNOSIS — I2694 Multiple subsegmental pulmonary emboli without acute cor pulmonale: Secondary | ICD-10-CM

## 2023-06-25 DIAGNOSIS — I82412 Acute embolism and thrombosis of left femoral vein: Principal | ICD-10-CM

## 2023-06-25 DIAGNOSIS — I82492 Acute embolism and thrombosis of other specified deep vein of left lower extremity: Secondary | ICD-10-CM | POA: Diagnosis not present

## 2023-06-25 LAB — GLUCOSE, CAPILLARY
Glucose-Capillary: 124 mg/dL — ABNORMAL HIGH (ref 70–99)
Glucose-Capillary: 181 mg/dL — ABNORMAL HIGH (ref 70–99)

## 2023-06-25 LAB — ECHOCARDIOGRAM COMPLETE
Area-P 1/2: 2.66 cm2
S' Lateral: 2.5 cm

## 2023-06-25 LAB — CBG MONITORING, ED
Glucose-Capillary: 126 mg/dL — ABNORMAL HIGH (ref 70–99)
Glucose-Capillary: 100 mg/dL — ABNORMAL HIGH (ref 70–99)
Glucose-Capillary: 81 mg/dL (ref 70–99)

## 2023-06-25 MED ORDER — ENALAPRIL MALEATE 10 MG PO TABS
20.0000 mg | ORAL_TABLET | Freq: Two times a day (BID) | ORAL | Status: DC
Start: 2023-06-25 — End: 2023-06-28
  Administered 2023-06-25 – 2023-06-28 (×7): 20 mg via ORAL
  Filled 2023-06-25 (×10): qty 2

## 2023-06-25 MED ORDER — AMLODIPINE BESYLATE 10 MG PO TABS
10.0000 mg | ORAL_TABLET | Freq: Every day | ORAL | Status: DC
  Administered 2023-06-25 – 2023-06-28 (×4): 10 mg via ORAL
  Filled 2023-06-25 (×3): qty 1
  Filled 2023-06-25: qty 2
  Filled 2023-06-25: qty 1

## 2023-06-25 MED ORDER — ATORVASTATIN CALCIUM 40 MG PO TABS
40.0000 mg | ORAL_TABLET | Freq: Every day | ORAL | Status: DC
  Administered 2023-06-25 – 2023-06-28 (×4): 40 mg via ORAL
  Filled 2023-06-25 (×5): qty 1

## 2023-06-25 MED ORDER — INSULIN GLARGINE-YFGN 100 UNIT/ML ~~LOC~~ SOLN
21.0000 [IU] | Freq: Every day | SUBCUTANEOUS | Status: DC
  Administered 2023-06-25: 21 [IU] via SUBCUTANEOUS
  Filled 2023-06-25 (×2): qty 0.21

## 2023-06-25 MED ORDER — INSULIN ASPART 100 UNIT/ML IJ SOLN
0.0000 [IU] | Freq: Three times a day (TID) | INTRAMUSCULAR | Status: DC
  Administered 2023-06-27: 1 [IU] via SUBCUTANEOUS
  Administered 2023-06-27: 2 [IU] via SUBCUTANEOUS
  Administered 2023-06-28: 3 [IU] via SUBCUTANEOUS
  Administered 2023-06-28: 1 [IU] via SUBCUTANEOUS

## 2023-06-25 MED ORDER — ASPIRIN 81 MG PO TBEC
81.0000 mg | DELAYED_RELEASE_TABLET | Freq: Every day | ORAL | Status: DC
  Administered 2023-06-25: 81 mg via ORAL
  Filled 2023-06-25 (×2): qty 1

## 2023-06-25 MED ORDER — INSULIN GLARGINE-YFGN 100 UNIT/ML ~~LOC~~ SOLN
16.0000 [IU] | Freq: Every day | SUBCUTANEOUS | Status: DC
  Administered 2023-06-25 – 2023-06-27 (×3): 16 [IU] via SUBCUTANEOUS
  Filled 2023-06-25 (×4): qty 0.16

## 2023-06-25 NOTE — ED Notes (Signed)
 Patient left the floor in stable condition, with his belongings and staff.

## 2023-06-25 NOTE — ED Notes (Signed)
 ED TO INPATIENT HANDOFF REPORT  ED Nurse Name and Phone #: Lorn, [phone]  S Name/Age/Gender William Mcguire 68 y.o. male Room/Bed: 046C/046C  Code Status   Code Status: Full Code  Home/SNF/Other Home Patient oriented to: self, place, time, and situation Is this baseline? Yes   Triage Complete: Triage complete  Chief Complaint Acute pulmonary embolism without acute cor pulmonale (HCC) [I26.99]  Triage Note Patient arrives for eval of LLE swelling and pain since yesterday. Most recent chemo treatment 12/16. Denies new shortness of breath. Denies recent long travel.    Allergies No Known Allergies  Level of Care/Admitting Diagnosis ED Disposition     ED Disposition  Admit   Condition  --   Comment  Hospital Area: [facility] [[id number]]  Level of Care: Telemetry Cardiac [103]  May place patient in observation at [facility] or [facility] if equivalent level of care is available:: No  Covid Evaluation: Asymptomatic - no recent exposure (last 10 days) testing not required  Diagnosis: Acute pulmonary embolism without acute cor pulmonale [facility]) [[id number]]  Admitting Physician: LONZELL EMELINE HERO [phone]  Attending Physician: LONZELL EMELINE HERO ELMARIE          B Medical/Surgery History Past Medical History:  Diagnosis Date   Diabetes mellitus    High cholesterol    Hypertension    Past Surgical History:  Procedure Laterality Date   BRONCHIAL BIOPSY  01/05/2023   Procedure: BRONCHIAL BIOPSIES;  Surgeon: Brenna Adine CROME, DO;  Location: [facility] END[facility];  Service: Pulmonary;;   BRONCHIAL BRUSHINGS  01/05/2023   Procedure: BRONCHIAL BRUSHINGS;  Surgeon: Brenna Adine CROME, DO;  Location: [facility] END[facility];  Service: Pulmonary;;   BRONCHIAL NEEDLE ASPIRATION BIOPSY  01/05/2023   Procedure: BRONCHIAL NEEDLE ASPIRATION BIOPSIES;  Surgeon: Brenna Adine CROME, DO;  Location: [facility]OS[facility];  Service: Pulmonary;;   INTERCOSTAL NERVE BLOCK Left 03/18/2023   Procedure: INTERCOSTAL  NERVE BLOCK;  Surgeon: Shyrl Linnie KIDD, MD;  Location: [facility] OR;  Service: Thoracic;  Laterality: Left;   KNEE SURGERY Left 2004   torn cartilage   LYMPH NODE BIOPSY Left 03/18/2023   Procedure: LYMPH NODE BIOPSY;  Surgeon: Shyrl Linnie KIDD, MD;  Location: [facility] OR;  Service: Thoracic;  Laterality: Left;   PATELLAR TENDON REPAIR  06/11/2011   Procedure: PATELLA TENDON REPAIR;  Surgeon: Cordella Glendia Hutchinson;  Location: [facility] ORS;  Service: Orthopedics;  Laterality: Right;     A IV Location/Drains/Wounds Patient Lines/Drains/Airways Status     Active Line/Drains/Airways     Name Placement date Placement time Site Days   Peripheral IV 06/24/23 18 G Right Antecubital 06/24/23  1913  Antecubital  1            Intake/Output Last 24 hours  Intake/Output Summary (Last 24 hours) at 06/25/2023 1328 Last data filed at 06/25/2023 1200 Gross per 24 hour  Intake --  Output 1200 ml  Net -1200 ml    Labs/Imaging Results for orders placed or performed during the hospital encounter of 06/24/23 (from the past 48 hours)  CBC     Status: Abnormal   Collection Time: 06/24/23  3:46 PM  Result Value Ref Range   WBC 2.9 (L) 4.0 - 10.5 K/uL   RBC 2.85 (L) 4.22 - 5.81 MIL/uL   Hemoglobin 7.9 (L) 13.0 - 17.0 g/dL   HCT 75.4 (L) 60.9 - 47.9 %   MCV 86.0 80.0 - 100.0 fL   MCH 27.7 26.0 - 34.0 pg   MCHC 32.2 30.0 -  36.0 g/dL   RDW 83.8 (H) 88.4 - 84.4 %   Platelets 134 (L) 150 - 400 K/uL   nRBC 0.0 0.0 - 0.2 %    Comment: Performed at [facility], [address] [place], [place] [address]  Basic metabolic panel     Status: Abnormal   Collection Time: 06/24/23  3:46 PM  Result Value Ref Range   Sodium 133 (L) 135 - 145 mmol/L   Potassium 5.0 3.5 - 5.1 mmol/L   Chloride 103 98 - 111 mmol/L   CO2 21 (L) 22 - 32 mmol/L   Glucose, Bld 231 (H) 70 - 99 mg/dL    Comment: Glucose reference range applies only to samples taken after fasting for at least 8 hours.   BUN 21 8 - 23 mg/dL   Creatinine,  Ser 8.18 (H) 0.61 - 1.24 mg/dL   Calcium  9.2 8.9 - 10.3 mg/dL   GFR, Estimated 40 (L) >60 mL/min    Comment: (NOTE) Calculated using the CKD-EPI Creatinine Equation (2021)    Anion gap 9 5 - 15    Comment: Performed at [facility], [address], [place], [place] [address]  Troponin I (High Sensitivity)     Status: None   Collection Time: 06/24/23  6:52 PM  Result Value Ref Range   Troponin I (High Sensitivity) 9 <18 ng/L    Comment: (NOTE) Elevated high sensitivity troponin I (hsTnI) values and significant  changes across serial measurements may suggest ACS but many other  chronic and acute conditions are known to elevate hsTnI results.  Refer to the Links section for chest pain algorithms and additional  guidance. Performed at [facility], [address] [place], [place] [address]   Troponin I (High Sensitivity)     Status: None   Collection Time: 06/24/23  8:20 PM  Result Value Ref Range   Troponin I (High Sensitivity) 11 <18 ng/L    Comment: (NOTE) Elevated high sensitivity troponin I (hsTnI) values and significant  changes across serial measurements may suggest ACS but many other  chronic and acute conditions are known to elevate hsTnI results.  Refer to the Links section for chest pain algorithms and additional  guidance. Performed at [facility], [address], [place], [place] [address]   CBG monitoring, ED     Status: Abnormal   Collection Time: 06/25/23  1:55 AM  Result Value Ref Range   Glucose-Capillary 126 (H) 70 - 99 mg/dL    Comment: Glucose reference range applies only to samples taken after fasting for at least 8 hours.  CBG monitoring, ED     Status: Abnormal   Collection Time: 06/25/23  7:55 AM  Result Value Ref Range   Glucose-Capillary 100 (H) 70 - 99 mg/dL    Comment: Glucose reference range applies only to samples taken after fasting for at least 8 hours.   Comment 1 Notify RN    Comment 2 Document in Chart   CBG  monitoring, ED     Status: None   Collection Time: 06/25/23 12:24 PM  Result Value Ref Range   Glucose-Capillary 81 70 - 99 mg/dL    Comment: Glucose reference range applies only to samples taken after fasting for at least 8 hours.   VAS US  LOWER EXTREMITY VENOUS (DVT) (ONLY [facility]) Result Date: 06/25/2023  Lower Venous DVT Study Patient Name:  William Mcguire  Date of Exam:   06/24/2023 Medical Rec #:  [id number]        Accession #:    [id number] Date of Birth: 1955/10/05        Patient Gender: M Patient Age:   37 years Exam Location:  [facility] Procedure:      VAS US  LOWER EXTREMITY VENOUS (DVT) Referring Phys: DAN FLOYD --------------------------------------------------------------------------------  Indications: Swelling. Other Indications: Shortness of breath when using stairs. Risk Factors: Lung cancer / chemotherapy. Comparison Study: No previous exams Performing Technologist: Jody Hill RVT, RDMS  Examination Guidelines: A complete evaluation includes B-mode imaging, spectral Doppler, color Doppler, and power Doppler as needed of all accessible portions of each vessel. Bilateral testing is considered an integral part of a complete examination. Limited examinations for reoccurring indications may be performed as noted. The reflux portion of the exam is performed with the patient in reverse Trendelenburg.  +-----+---------------+---------+-----------+----------+--------------+ RIGHTCompressibilityPhasicitySpontaneityPropertiesThrombus Aging +-----+---------------+---------+-----------+----------+--------------+ CFV  Full           Yes      Yes                                 +-----+---------------+---------+-----------+----------+--------------+   +--------+---------------+---------+-----------+----------+--------------------+ LEFT    CompressibilityPhasicitySpontaneityPropertiesThrombus Aging       +--------+---------------+---------+-----------+----------+--------------------+  CFV     None                                                              +--------+---------------+---------+-----------+----------+--------------------+ SFJ     None                                                              +--------+---------------+---------+-----------+----------+--------------------+ FV Prox None           No       No                                        +--------+---------------+---------+-----------+----------+--------------------+ FV Mid  None           No       No                                        +--------+---------------+---------+-----------+----------+--------------------+ FV      None           No       No                                        Distal                                                                    +--------+---------------+---------+-----------+----------+--------------------+  PFV     None           No       No                                        +--------+---------------+---------+-----------+----------+--------------------+ POP     None           No       No                                        +--------+---------------+---------+-----------+----------+--------------------+ PTV     Full                                                              +--------+---------------+---------+-----------+----------+--------------------+ PERO    Full                                                              +--------+---------------+---------+-----------+----------+--------------------+ GSV     None           No       No                   Acute (origin /                                                           prox)                +--------+---------------+---------+-----------+----------+--------------------+ EIV     None           No       No                   Acute - distal                                                            portion               +--------+---------------+---------+-----------+----------+--------------------+ Partial appearing thrombus in mid EIV    Summary: RIGHT: - No evidence of common femoral vein obstruction.   LEFT: - Findings consistent with acute deep vein thrombosis involving the left common femoral vein, SF junction, left femoral vein, left proximal profunda vein, left popliteal vein, and external iliac (mid and distal).  No cystic structure found in the popliteal fossa. Subcutaneous edema noted in calf and ankle.  *See table(s) above for measurements and observations. Electronically signed by Penne Colorado MD on 06/25/2023 at 8:12:00 AM.    Final    CT  Angio Chest PE W and/or Wo Contrast Result Date: 06/24/2023 CLINICAL DATA:  Pulmonary embolism (PE) suspected, high prob Left lung cancer. EXAM: CT ANGIOGRAPHY CHEST WITH CONTRAST TECHNIQUE: Multidetector CT imaging of the chest was performed using the standard protocol during bolus administration of intravenous contrast. Multiplanar CT image reconstructions and MIPs were obtained to evaluate the vascular anatomy. RADIATION DOSE REDUCTION: This exam was performed according to the departmental dose-optimization program which includes automated exposure control, adjustment of the mA and/or kV according to patient size and/or use of iterative reconstruction technique. CONTRAST:  60mL OMNIPAQUE  IOHEXOL  350 MG/ML SOLN COMPARISON:  Radiograph 05/05/2023, CT 03/26/2023 FINDINGS: Cardiovascular: Multilobar acute segmental pulmonary emboli in the right lung. Thromboembolic burden is small to moderate. There is no right heart strain. The RV to LV ratio is 0.79. The heart is normal in size. Thoracic aorta is normal in caliber. No acute aortic findings. Common origin of brachiocephalic and left common carotid artery. No pericardial effusion. Mediastinum/Nodes: Postsurgical change at the left hilum. No enlarged mediastinal or hilar lymph nodes. Thyroid  nodules are less well-defined on the current  exam given phase of contrast. Patulous esophagus without wall thickening. 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 The previous hydropneumothorax has improved. Small amount of pleural fluid persists including a small loculated components in the anterior upper lobe. No definite pleural thickening/enhancement. Clustered left lung nodule series 6, image 51, are unchanged from prior exam. No evidence of pulmonary infarct or focal right lung opacity. No right pleural effusion. Upper Abdomen: No acute findings. Musculoskeletal: Diffuse thoracic spondylosis with anterior spurring. No evidence of focal bone lesion. Minimal bilateral gynecomastia, more so on the right. Review of the MIP images confirms the above findings. IMPRESSION: 1. Multilobar acute segmental pulmonary emboli in the right lung. Thromboembolic burden is small to moderate. No right heart strain. 2. Postsurgical change of left upper lobectomy. The previous hydropneumothorax has improved. Small amount of pleural fluid persists including a small loculated components in the anterior upper lobe. 3. Clustered left lung nodules are unchanged from prior exam. Recommend attention at follow-up. Critical Value/emergent results were called by telephone at the time of interpretation on 06/24/2023 at 8:35 pm to provider BROOKE SMALL , who verbally acknowledged these results. Electronically Signed   By: Andrea Gasman M.D.   On: 06/24/2023 20:37    Pending Labs Unresulted Labs (From admission, onward)    None       Vitals/Pain Today's Vitals   06/25/23 0900 06/25/23 0930 06/25/23 1200 06/25/23 1300  BP: 134/75 131/71 125/75 123/74  Pulse: 75 78 75 78  Resp: 18 19 20 20   Temp:      TempSrc:      SpO2: 100% 100% 100% 100%  PainSc:        Isolation Precautions No active isolations  Medications Medications  enoxaparin  (LOVENOX ) injection 100 mg (100 mg Subcutaneous  Given 06/25/23 1021)  acetaminophen  (TYLENOL ) tablet 650 mg (has no administration in time range)    Or  acetaminophen  (TYLENOL ) suppository 650 mg (has no administration in time range)  ondansetron  (ZOFRAN ) tablet 4 mg (has no administration in time range)    Or  ondansetron  (ZOFRAN ) injection 4 mg (has no administration in time range)  oxyCODONE -acetaminophen  (PERCOCET/ROXICET) 5-325 MG per tablet 1-2 tablet (has no administration in time range)  insulin  glargine-yfgn (SEMGLEE ) injection 21 Units (21 Units Subcutaneous Given 06/25/23 0202)  enalapril  (VASOTEC ) tablet 20 mg (20 mg Oral Given 06/25/23 1022)  aspirin  EC tablet 81 mg (81 mg Oral Given 06/25/23 1023)  atorvastatin  (LIPITOR) tablet 40 mg (40 mg Oral Given 06/25/23 1021)  amLODipine  (NORVASC ) tablet 10 mg (10 mg Oral Given 06/25/23 1021)  insulin  aspart (novoLOG ) injection 0-9 Units ( Subcutaneous Not Given 06/25/23 1231)  sodium chloride  0.9 % bolus 500 mL (0 mLs Intravenous Stopped 06/24/23 2016)  iohexol  (OMNIPAQUE ) 350 MG/ML injection 60 mL (60 mLs Intravenous Contrast Given 06/24/23 2012)  oxyCODONE -acetaminophen  (PERCOCET/ROXICET) 5-325 MG per tablet 1 tablet (1 tablet Oral Given 06/24/23 2122)    Mobility walks     Focused Assessments Possible new lung cancer, + for DVT, on Lovenox ,    R Recommendations: See Admitting Provider Note  Report given to:   Additional Notes: Pt is AOX4, in a hospital bed, able to use a urinal, family was at bedside, in Echo at this time

## 2023-06-25 NOTE — Assessment & Plan Note (Addendum)
 Cont Lantus 21u at bedtime Sensitive SSI AC for the moment Has been holding trulicity for procedure (supposed to have screening colonoscopy tomorrow), will leave this on hold for the moment

## 2023-06-25 NOTE — Care Management Obs Status (Signed)
 MEDICARE OBSERVATION STATUS NOTIFICATION   Patient Details  Name: Naythen Heikkila MRN: [id number] Date of Birth: [birth year]   Medicare Observation Status Notification Given:  Yes    Gala Lewandowsky, RN 06/25/2023, 3:21 PM

## 2023-06-25 NOTE — H&P (Signed)
 History and Physical    Patient: William Mcguire FMW:[id number] DOB: November 22, 1955 DOA: 06/24/2023 DOS: the patient was seen and examined on 06/25/2023 PCP: Jarold Medici, MD  Patient coming from: Home  Chief Complaint:  Chief Complaint  Patient presents with   Leg Swelling   HPI: William Mcguire is a 68 y.o. 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  Pt on post-op chemo but no known active tumor at this time.  Pt with no recent travel.  Pt presents to ED with c/o LLE pain, swelling.  Ongoing for past 3 days, progressively worsening.  Some SOB that's new as well.  No CP.  Found to have LLE DVT and small PE in R lung with no RHS today in ED.  Review of Systems: As mentioned in the history of present illness. All other systems reviewed and are negative. Past Medical History:  Diagnosis Date   Diabetes mellitus    High cholesterol    Hypertension    Past Surgical History:  Procedure Laterality Date   BRONCHIAL BIOPSY  01/05/2023   Procedure: BRONCHIAL BIOPSIES;  Surgeon: Brenna Adine CROME, DO;  Location: [facility];  Service: Pulmonary;;   BRONCHIAL BRUSHINGS  01/05/2023   Procedure: BRONCHIAL BRUSHINGS;  Surgeon: Brenna Adine CROME, DO;  Location: [facility];  Service: Pulmonary;;   BRONCHIAL NEEDLE ASPIRATION BIOPSY  01/05/2023   Procedure: BRONCHIAL NEEDLE ASPIRATION BIOPSIES;  Surgeon: Brenna Adine CROME, DO;  Location: [facility];  Service: Pulmonary;;   INTERCOSTAL NERVE BLOCK Left 03/18/2023   Procedure: INTERCOSTAL NERVE BLOCK;  Surgeon: Shyrl Linnie KIDD, MD;  Location: [facility];  Service: Thoracic;  Laterality: Left;   KNEE SURGERY Left 2004   torn cartilage   LYMPH NODE BIOPSY Left 03/18/2023   Procedure: LYMPH NODE BIOPSY;  Surgeon: Shyrl Linnie KIDD, MD;  Location: [facility];  Service: Thoracic;  Laterality: Left;   PATELLAR TENDON REPAIR  06/11/2011   Procedure: PATELLA  TENDON REPAIR;  Surgeon: Cordella Glendia Hutchinson;  Location: [facility];  Service: Orthopedics;  Laterality: Right;   Social History:  reports that he has never smoked. He has never used smokeless tobacco. He reports that he does not drink alcohol and does not use drugs.  No Known Allergies  Family History  Problem Relation Age of Onset   Hypertension Mother    Multiple myeloma Mother    Hypertension Father    Diabetes Father     Prior to Admission medications   Medication Sig Start Date End Date Taking? Authorizing Provider  amLODipine  (NORVASC ) 10 MG tablet TAKE 1 TABLET BY MOUTH EVERY DAY 03/18/23  Yes Jarold Medici, MD  aspirin  EC 81 MG tablet Take 81 mg by mouth daily. Swallow whole.   Yes [provider]  atorvastatin  (LIPITOR) 40 MG tablet TAKE 1 TABLET BY MOUTH EVERY DAY 03/25/23  Yes Jarold Medici, MD  cyanocobalamin  (VITAMIN B12) 1000 MCG/ML injection INJECT 1 ML (1,000 MCG TOTAL) INTO THE MUSCLE EVERY 30 DAYS. 12/10/22  Yes Jarold Medici, MD  dexamethasone  (DECADRON ) 4 MG tablet Take 1 tablet twice a day the day before, the day of, and the day after chemotherapy 04/13/23  Yes Heilingoetter, Cassandra L, PA-C  Dulaglutide  (TRULICITY ) 1.5 MG/0.5ML SOAJ Inject 1.5 mg into the skin every Sunday.   Yes [provider]  enalapril  (VASOTEC ) 20 MG tablet TAKE 1 TABLET TWICE DAILY. 06/23/23  Yes Jarold Medici, MD  folic  acid (FOLVITE ) 1 MG tablet Take 1 tablet (1 mg total) by mouth daily. 04/13/23  Yes Heilingoetter, Cassandra L, PA-C  insulin  glargine (LANTUS ) 100 UNIT/ML injection Inject 21 Units into the skin at bedtime.   Yes [provider]  oxyCODONE  (OXY IR/ROXICODONE ) 5 MG immediate release tablet Take 1 tablet (5 mg total) by mouth every 6 (six) hours as needed for moderate pain. 03/23/23  Yes Stehler, Con BROCKS, PA-C  sildenafil  (VIAGRA ) 100 MG tablet TAKE 1 TABLET BY MOUTH EVERY DAY AS NEEDED (INSURANCE COVERS 10 TAB PER 77DAYS) 02/13/23  Yes Jarold Medici, MD   chlorproMAZINE  (THORAZINE ) 25 MG tablet Take 25 mg by mouth 3 (three) times daily as needed for hiccoughs. Patient not taking: Reported on 06/24/2023    [provider]  glucose blood (ONETOUCH VERIO) test strip Use as instructed to check blood sugars twice daily E11.69 06/23/23   Jarold Medici, MD  glucose blood test strip Use as instructed to test blood sugar 3 times a day. Dx code: e11.65 11/26/21   Jarold Medici, MD  ondansetron  (ZOFRAN ) 8 MG tablet Take 1 tablet (8 mg total) by mouth every 8 (eight) hours as needed for nausea or vomiting. Starting day 3 after chemotherapy Patient not taking: Reported on 06/24/2023 04/29/23   Heilingoetter, Cassandra L, PA-C  prochlorperazine  (COMPAZINE ) 10 MG tablet Take 1 tablet (10 mg total) by mouth every 6 (six) hours as needed. Patient not taking: Reported on 06/24/2023 05/06/23   Heilingoetter, Cassandra L, PA-C  SYRINGE-NEEDLE, DISP, 3 ML (B-D INTEGRA SYRINGE) 25G X 5/8 3 ML MISC USE AS DIRECTED 06/23/23   Jarold Medici, MD    Physical Exam: Vitals:   06/24/23 1900 06/24/23 2030 06/24/23 2200 06/24/23 2251  BP: 126/65 (!) 153/78 135/76   Pulse: 79 85 80   Resp: 16 16 13    Temp:    98.6 F (37 C)  TempSrc:    Oral  SpO2: 100% 100% 99%    Constitutional: NAD, calm, comfortable Respiratory: clear to auscultation bilaterally, no wheezing, no crackles. Normal respiratory effort. No accessory muscle use.  Cardiovascular: Regular rate and rhythm, no murmurs / rubs / gallops. No extremity edema. 2+ pedal pulses. No carotid bruits.  Abdomen: no tenderness, no masses palpated. No hepatosplenomegaly. Bowel sounds positive.  Musculoskeletal: LLE swelling when compared to RLE.  No skin changes to suggest phlegmasia. Neurologic: CN 2-12 grossly intact. Sensation intact, DTR normal. Strength 5/5 in all 4.  Psychiatric: Normal judgment and insight. Alert and oriented x 3. Normal mood.   Data Reviewed:    Labs on Admission: I have personally  reviewed following labs and imaging studies  CBC: Recent Labs  Lab 06/22/23 1142 06/24/23 1546  WBC 3.8* 2.9*  NEUTROABS 2.2  --   HGB 8.2* 7.9*  HCT 24.9* 24.5*  MCV 85.3 86.0  PLT 99* 134*   Basic Metabolic Panel: Recent Labs  Lab 06/22/23 1142 06/24/23 1546  NA 130* 133*  K 4.5 5.0  CL 103 103  CO2 23 21*  GLUCOSE 158* 231*  BUN 22 21  CREATININE 1.50* 1.81*  CALCIUM  9.4 9.2   GFR: Estimated Creatinine Clearance: 50.8 mL/min (A) (by C-G formula based on SCr of 1.81 mg/dL (H)). Liver Function Tests: Recent Labs  Lab 06/22/23 1142  AST 28  ALT 22  ALKPHOS 119  BILITOT 0.4  PROT 9.4*  ALBUMIN 3.3*   No results for input(s): LIPASE, AMYLASE in the last 168 hours. No results for input(s): AMMONIA in the  last 168 hours. Coagulation Profile: No results for input(s): INR, PROTIME in the last 168 hours. Cardiac Enzymes: No results for input(s): CKTOTAL, CKMB, CKMBINDEX, TROPONINI in the last 168 hours. BNP (last 3 results) No results for input(s): PROBNP in the last 8760 hours. HbA1C: No results for input(s): HGBA1C in the last 72 hours. CBG: No results for input(s): GLUCAP in the last 168 hours. Lipid Profile: No results for input(s): CHOL, HDL, LDLCALC, TRIG, CHOLHDL, LDLDIRECT in the last 72 hours. Thyroid  Function Tests: No results for input(s): TSH, T4TOTAL, FREET4, T3FREE, THYROIDAB in the last 72 hours. Anemia Panel: No results for input(s): VITAMINB12, FOLATE, FERRITIN, TIBC, IRON, RETICCTPCT in the last 72 hours. Urine analysis:    Component Value Date/Time   COLORURINE YELLOW 05/08/2023 1347   APPEARANCEUR CLEAR 05/08/2023 1347   LABSPEC 1.015 05/08/2023 1347   PHURINE 6.0 05/08/2023 1347   GLUCOSEU NEGATIVE 05/08/2023 1347   HGBUR SMALL (A) 05/08/2023 1347   BILIRUBINUR NEGATIVE 05/08/2023 1347   BILIRUBINUR Negative 08/18/2022 1616   KETONESUR NEGATIVE 05/08/2023 1347   PROTEINUR  NEGATIVE 05/08/2023 1347   UROBILINOGEN 0.2 08/18/2022 1616   UROBILINOGEN 0.2 06/27/2011 1205   NITRITE NEGATIVE 05/08/2023 1347   LEUKOCYTESUR TRACE (A) 05/08/2023 1347    Radiological Exams on Admission: CT Angio Chest PE W and/or Wo Contrast Result Date: 06/24/2023 CLINICAL DATA:  Pulmonary embolism (PE) suspected, high prob Left lung cancer. EXAM: CT ANGIOGRAPHY CHEST WITH CONTRAST TECHNIQUE: Multidetector CT imaging of the chest was performed using the standard protocol during bolus administration of intravenous contrast. Multiplanar CT image reconstructions and MIPs were obtained to evaluate the vascular anatomy. RADIATION DOSE REDUCTION: This exam was performed according to the departmental dose-optimization program which includes automated exposure control, adjustment of the mA and/or kV according to patient size and/or use of iterative reconstruction technique. CONTRAST:  60mL OMNIPAQUE  IOHEXOL  350 MG/ML SOLN COMPARISON:  Radiograph 05/05/2023, CT 03/26/2023 FINDINGS: Cardiovascular: Multilobar acute segmental pulmonary emboli in the right lung. Thromboembolic burden is small to moderate. There is no right heart strain. The RV to LV ratio is 0.79. The heart is normal in size. Thoracic aorta is normal in caliber. No acute aortic findings. Common origin of brachiocephalic and left common carotid artery. No pericardial effusion. Mediastinum/Nodes: Postsurgical change at the left hilum. No enlarged mediastinal or hilar lymph nodes. Thyroid  nodules are less well-defined on the current exam given phase of contrast. Patulous esophagus without wall thickening. 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 The previous hydropneumothorax has improved. Small amount of pleural fluid persists including a small loculated components in the anterior upper lobe. No definite pleural thickening/enhancement. Clustered left lung nodule series  6, image 51, are unchanged from prior exam. No evidence of pulmonary infarct or focal right lung opacity. No right pleural effusion. Upper Abdomen: No acute findings. Musculoskeletal: Diffuse thoracic spondylosis with anterior spurring. No evidence of focal bone lesion. Minimal bilateral gynecomastia, more so on the right. Review of the MIP images confirms the above findings. IMPRESSION: 1. Multilobar acute segmental pulmonary emboli in the right lung. Thromboembolic burden is small to moderate. No right heart strain. 2. Postsurgical change of left upper lobectomy. The previous hydropneumothorax has improved. Small amount of pleural fluid persists including a small loculated components in the anterior upper lobe. 3. Clustered left lung nodules are unchanged from prior exam. Recommend attention at follow-up. Critical Value/emergent results were called by telephone at the time of interpretation on 06/24/2023 at  8:35 pm to provider BROOKE SMALL , who verbally acknowledged these results. Electronically Signed   By: Andrea Gasman M.D.   On: 06/24/2023 20:37   VAS US  LOWER EXTREMITY VENOUS (DVT) (ONLY [facility]) Result Date: 06/24/2023  Lower Venous DVT Study Patient Name:  KAYLEB WARSHAW  Date of Exam:   06/24/2023 Medical Rec #: [id number]        Accession #:    [id number] Date of Birth: 1955-11-26        Patient Gender: M Patient Age:   63 years Exam Location:  [facility] Procedure:      VAS US  LOWER EXTREMITY VENOUS (DVT) Referring Phys: DAN FLOYD --------------------------------------------------------------------------------  Indications: Swelling. Other Indications: Shortness of breath when using stairs. Risk Factors: Lung cancer / chemotherapy. Comparison Study: No previous exams Performing Technologist: Jody Hill RVT, RDMS  Examination Guidelines: A complete evaluation includes B-mode imaging, spectral Doppler, color Doppler, and power Doppler as needed of all accessible portions of each vessel. Bilateral  testing is considered an integral part of a complete examination. Limited examinations for reoccurring indications may be performed as noted. The reflux portion of the exam is performed with the patient in reverse Trendelenburg.  +-----+---------------+---------+-----------+----------+--------------+ RIGHTCompressibilityPhasicitySpontaneityPropertiesThrombus Aging +-----+---------------+---------+-----------+----------+--------------+ CFV  Full           Yes      Yes                                 +-----+---------------+---------+-----------+----------+--------------+   +--------+---------------+---------+-----------+----------+--------------------+ LEFT    CompressibilityPhasicitySpontaneityPropertiesThrombus Aging       +--------+---------------+---------+-----------+----------+--------------------+ CFV     None                                                              +--------+---------------+---------+-----------+----------+--------------------+ SFJ     None                                                              +--------+---------------+---------+-----------+----------+--------------------+ FV Prox None           No       No                                        +--------+---------------+---------+-----------+----------+--------------------+ FV Mid  None           No       No                                        +--------+---------------+---------+-----------+----------+--------------------+ FV      None           No       No  Distal                                                                    +--------+---------------+---------+-----------+----------+--------------------+ PFV     None           No       No                                        +--------+---------------+---------+-----------+----------+--------------------+ POP     None           No       No                                         +--------+---------------+---------+-----------+----------+--------------------+ PTV     Full                                                              +--------+---------------+---------+-----------+----------+--------------------+ PERO    Full                                                              +--------+---------------+---------+-----------+----------+--------------------+ GSV     None           No       No                   Acute (origin /                                                           prox)                +--------+---------------+---------+-----------+----------+--------------------+ EIV     None           No       No                   Acute - distal                                                            portion              +--------+---------------+---------+-----------+----------+--------------------+ Partial appearing thrombus in mid EIV   Summary: RIGHT: - No evidence of common femoral vein obstruction.   LEFT: - Findings consistent with acute deep vein  thrombosis involving the left common femoral vein, SF junction, left femoral vein, left proximal profunda vein, left popliteal vein, and external iliac (mid and distal).  No cystic structure found in the popliteal fossa. Subcutaneous edema noted in calf and ankle.  *See table(s) above for measurements and observations.    Preliminary     EKG: Independently reviewed.   Assessment and Plan: * Acute pulmonary embolism without acute cor pulmonale (HCC) Pt with acute PE, no RHS on CT scan. However, given h/o lung Lobectomy + current chemotherapy for NSCLC (adenocarcinoma), admitting pt initially for [facility] (pt felt to be higher risk). Lovenox  per pharm Heme/onc consult in AM No known lung CA tumor burden nor reoccurence on CT today (see discussion below) ? Unprovoked PE? No recent travel or prolonged sitting per patient 2D echo ordered Source appears to be fairly  extensive DVT in left leg.  Has some swelling at this time, but no skin changes to suggest phlegmasia or anything at this time.  Adenocarcinoma of left lung (HCC) On adjuvant chemo, they had throught they had gotten all of the tumor with lobectomy in Sept. No definite evidence to suggest recurrence on CTA chest today: has some focus of nodularity in lung but this is unchanged compared to prior imaging. Still, unprovoked PE does raise concern / question. Secure chat sent to Dr. Loretha that pt getting admitted for PE. Will defer to him / Dr. Sherrod what (if any) additional imaging should be ordered to rule out new metastatic dz / recurrence / etc (ie CT AP, vs PET scan?)  CKD stage 3a, GFR 45-59 ml/min (HCC) Creat 1.8 today usually runs 1.5-1.6 baseline. Repeat BMP in AM  HTN (hypertension) Cont amlodipine , enalapril   T2DM (type 2 diabetes mellitus) (HCC) Cont Lantus  21u at bedtime Sensitive SSI AC for the moment Has been holding trulicity  for procedure (supposed to have screening colonoscopy tomorrow), will leave this on hold for the moment      Advance Care Planning:   Code Status: Full Code  Consults: Secure chat sent to Dr. Loretha, call heme/onc in AM.  Also might want to let GI know he's gonna miss his colonoscopy today.  Family Communication: No family in room  Severity of Illness: The appropriate patient status for this patient is OBSERVATION. Observation status is judged to be reasonable and necessary in order to provide the required intensity of service to ensure the patient's safety. The patient's presenting symptoms, physical exam findings, and initial radiographic and laboratory data in the context of their medical condition is felt to place them at decreased risk for further clinical deterioration. Furthermore, it is anticipated that the patient will be medically stable for discharge from the hospital within 2 midnights of admission.   Author: LONZELL EMELINE HERO.,  DO 06/25/2023 1:32 AM  For on call review [email or web address].

## 2023-06-25 NOTE — Plan of Care (Signed)
   Problem: Education: Goal: Ability to describe self-care measures that may prevent or decrease complications (Diabetes Survival Skills Education) will improve Outcome: Progressing Goal: Individualized Educational Video(s) Outcome: Progressing

## 2023-06-25 NOTE — Telephone Encounter (Signed)
 Patient Product/process development scientist completed.    The patient is insured through [facility]. Patient has Medicare and is not eligible for a copay card, but may be able to apply for patient assistance or Medicare RX Payment Plan (Patient Must reach out to their plan, if eligible for payment plan), if available.    Ran test claim for Eliquis 5 mg and the current 30 day co-pay is $420.00 due to a $375.00 deductible.  Will be $45.00 once deductible is met.  Ran test claim for Xarelto 20 mg and the current 30 day co-pay is $420.00 due to a $375.00 deductible.  Will be $45.00 once deductible is met.  This test claim was processed through [facility]- copay amounts may vary at other pharmacies due to pharmacy/plan contracts, or as the patient moves through the different stages of their insurance plan.     Roland Earl, CPHT Pharmacy Technician III Certified Patient Advocate [facility] Patient Advocate Team Direct Number: [phone]  Fax: [phone]

## 2023-06-25 NOTE — Assessment & Plan Note (Signed)
 Creat 1.8 today usually runs 1.5-1.6 baseline. Repeat BMP in AM

## 2023-06-25 NOTE — ED Notes (Signed)
 Placed patient in hospital bed, per their request.

## 2023-06-25 NOTE — Assessment & Plan Note (Signed)
 Cont amlodipine, enalapril

## 2023-06-25 NOTE — Assessment & Plan Note (Addendum)
 Pt with acute PE, no RHS on CT scan. However, given h/o lung Lobectomy + current chemotherapy for NSCLC (adenocarcinoma), admitting pt initially for [facility] (pt felt to be higher risk). Lovenox  per pharm Heme/onc consult in AM No known lung CA tumor burden nor reoccurence on CT today (see discussion below) ? Unprovoked PE? No recent travel or prolonged sitting per patient 2D echo ordered Source appears to be fairly extensive DVT in left leg.  Has some swelling at this time, but no skin changes to suggest phlegmasia or anything at this time.

## 2023-06-25 NOTE — Assessment & Plan Note (Addendum)
 On adjuvant chemo, they had throught they had gotten all of the tumor with lobectomy in Sept. No definite evidence to suggest recurrence on CTA chest today: has some focus of nodularity in lung but this is unchanged compared to prior imaging. Still, unprovoked PE does raise concern / question. Secure chat sent to Dr. Loretha that pt getting admitted for PE. Will defer to him / Dr. Sherrod what (if any) additional imaging should be ordered to rule out new metastatic dz / recurrence / etc (ie CT AP, vs PET scan?)

## 2023-06-25 NOTE — Progress Notes (Signed)
 Brief note: Patient is a 68 year old male with past medical history significant for adenocarcinoma of left lung (stage II), status post lobectomy and currently on chemotherapy; diabetes mellitus type 2, hypertension, hyperlipidemia and chronic kidney disease stage III.  Patient has never smoked cigarettes. -Patient was admitted with extensive DVT of left lower extremity and acute pulmonary embolism. -Patient is currently on full dose Lovenox . -Hematology/oncology team has been consulted (Dr. Tina). -Vascular surgery team is also being consulted (Dr. Lonni Gaskins), and to assess patient for possible thrombectomy and thrombolysis. -Left lower extremity swelling persist. -Patient continues to report pain with ambulation.  As per H&P done earlier today: Lateef Juncaj is a 68 y.o. 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  Pt on post-op chemo but no known active tumor at this time.   Pt with no recent travel.   Pt presents to ED with c/o LLE pain, swelling.  Ongoing for past 3 days, progressively worsening.  Some SOB that's new as well.  No CP.   Found to have LLE DVT and small PE in R lung with no RHS today in ED.   06/24/2022: Further management depend on hospital course.

## 2023-06-25 NOTE — Consult Note (Signed)
 Hospital Consult    Reason for Consult:  Left leg DVT Referring Physician:  Dr. Rosario MRN #:  [id number]  History of Present Illness: This is a 68 y.o. 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  He was admitted yesterday with 3 days of left leg swelling.  States he has had no risk factors at home including no immobility or recent illnesses.  Duplex showed DVT into the left external iliac vein.  Also found to have multilobar segmental PE in the right lung with small burden.  States no prior DVTs.  Is having significant pain in the left leg particular with walking.  States he is very active at home with his church and caring for his grandchildren.  Past Medical History:  Diagnosis Date   Diabetes mellitus    High cholesterol    Hypertension     Past Surgical History:  Procedure Laterality Date   BRONCHIAL BIOPSY  01/05/2023   Procedure: BRONCHIAL BIOPSIES;  Surgeon: Brenna Adine CROME, DO;  Location: [facility];  Service: Pulmonary;;   BRONCHIAL BRUSHINGS  01/05/2023   Procedure: BRONCHIAL BRUSHINGS;  Surgeon: Brenna Adine CROME, DO;  Location: [facility];  Service: Pulmonary;;   BRONCHIAL NEEDLE ASPIRATION BIOPSY  01/05/2023   Procedure: BRONCHIAL NEEDLE ASPIRATION BIOPSIES;  Surgeon: Brenna Adine CROME, DO;  Location: [facility];  Service: Pulmonary;;   INTERCOSTAL NERVE BLOCK Left 03/18/2023   Procedure: INTERCOSTAL NERVE BLOCK;  Surgeon: Shyrl Linnie KIDD, MD;  Location: [facility];  Service: Thoracic;  Laterality: Left;   KNEE SURGERY Left 2004   torn cartilage   LYMPH NODE BIOPSY Left 03/18/2023   Procedure: LYMPH NODE BIOPSY;  Surgeon: Shyrl Linnie KIDD, MD;  Location: [facility];  Service: Thoracic;  Laterality: Left;   PATELLAR TENDON REPAIR  06/11/2011   Procedure: PATELLA TENDON REPAIR;  Surgeon:  Cordella Glendia Hutchinson;  Location: [facility];  Service: Orthopedics;  Laterality: Right;    No Known Allergies  Prior to Admission medications   Medication Sig Start Date End Date Taking? Authorizing Provider  amLODipine  (NORVASC ) 10 MG tablet TAKE 1 TABLET BY MOUTH EVERY DAY 03/18/23  Yes Jarold Medici, MD  aspirin  EC 81 MG tablet Take 81 mg by mouth daily. Swallow whole.   Yes [provider]  atorvastatin  (LIPITOR) 40 MG tablet TAKE 1 TABLET BY MOUTH EVERY DAY 03/25/23  Yes Jarold Medici, MD  cyanocobalamin  (VITAMIN B12) 1000 MCG/ML injection INJECT 1 ML (1,000 MCG TOTAL) INTO THE MUSCLE EVERY 30 DAYS. 12/10/22  Yes Jarold Medici, MD  dexamethasone  (DECADRON ) 4 MG tablet Take 1 tablet twice a day the day before, the day of, and the day after chemotherapy 04/13/23  Yes Heilingoetter, Cassandra L, PA-C  Dulaglutide  (TRULICITY ) 1.5 MG/0.5ML SOAJ Inject 1.5 mg into the skin every Sunday.   Yes [provider]  enalapril  (VASOTEC ) 20 MG tablet TAKE 1 TABLET TWICE DAILY. 06/23/23  Yes Jarold Medici, MD  folic acid  (FOLVITE ) 1 MG tablet Take 1 tablet (1 mg total) by mouth daily. 04/13/23  Yes Heilingoetter, Cassandra L, PA-C  insulin  glargine (LANTUS ) 100 UNIT/ML injection Inject 21 Units into the skin at bedtime.   Yes [provider]  oxyCODONE  (OXY IR/ROXICODONE ) 5 MG immediate release tablet Take 1 tablet (5 mg total) by mouth every 6 (six) hours as needed for moderate pain. 03/23/23  Yes  Stehler, Con BROCKS, PA-C  sildenafil  (VIAGRA ) 100 MG tablet TAKE 1 TABLET BY MOUTH EVERY DAY AS NEEDED (INSURANCE COVERS 10 TAB PER 77DAYS) 02/13/23  Yes Jarold Medici, MD  chlorproMAZINE  (THORAZINE ) 25 MG tablet Take 25 mg by mouth 3 (three) times daily as needed for hiccoughs. Patient not taking: Reported on 06/24/2023    [provider]  glucose blood (ONETOUCH VERIO) test strip Use as instructed to check blood sugars twice daily E11.69 06/23/23   Jarold Medici, MD  glucose blood  test strip Use as instructed to test blood sugar 3 times a day. Dx code: e11.65 11/26/21   Jarold Medici, MD  ondansetron  (ZOFRAN ) 8 MG tablet Take 1 tablet (8 mg total) by mouth every 8 (eight) hours as needed for nausea or vomiting. Starting day 3 after chemotherapy Patient not taking: Reported on 06/24/2023 04/29/23   Heilingoetter, Cassandra L, PA-C  prochlorperazine  (COMPAZINE ) 10 MG tablet Take 1 tablet (10 mg total) by mouth every 6 (six) hours as needed. Patient not taking: Reported on 06/24/2023 05/06/23   Heilingoetter, Cassandra L, PA-C  SYRINGE-NEEDLE, DISP, 3 ML (B-D INTEGRA SYRINGE) 25G X 5/8 3 ML MISC USE AS DIRECTED 06/23/23   Jarold Medici, MD    Social History   Socioeconomic History   Marital status: Married    Spouse name: Not on file   Number of children: Not on file   Years of education: Not on file   Highest education level: Not on file  Occupational History   Not on file  Tobacco Use   Smoking status: Never   Smokeless tobacco: Never   Tobacco comments:    n/a  Vaping Use   Vaping status: Never Used  Substance and Sexual Activity   Alcohol use: No   Drug use: No   Sexual activity: Not on file  Other Topics Concern   Not on file  Social History Narrative   Not on file   Social Drivers of Health   Financial Resource Strain: Low Risk  (11/05/2022)   Overall Financial Resource Strain (CARDIA)    Difficulty of Paying Living Expenses: Not hard at all  Food Insecurity: No Food Insecurity (06/25/2023)   Hunger Vital Sign    Worried About Running Out of Food in the Last Year: Never true    Ran Out of Food in the Last Year: Never true  Transportation Needs: No Transportation Needs (06/25/2023)   PRAPARE - Administrator, Civil Service (Medical): No    Lack of Transportation (Non-Medical): No  Physical Activity: Sufficiently Active (11/05/2022)   Exercise Vital Sign    Days of Exercise per Week: 7 days    Minutes of Exercise per Session: 60 min   Stress: No Stress Concern Present (11/05/2022)   [employer] of Occupational Health - Occupational Stress Questionnaire    Feeling of Stress : Not at all  Social Connections: Moderately Integrated (06/25/2023)   Social Connection and Isolation Panel [NHANES]    Frequency of Communication with Friends and Family: More than three times a week    Frequency of Social Gatherings with Friends and Family: More than three times a week    Attends Religious Services: More than 4 times per year    Active Member of [employer] or Organizations: No    Attends Banker Meetings: Never    Marital Status: Married  Catering Manager Violence: Not At Risk (06/25/2023)   Humiliation, Afraid, Rape, and Kick questionnaire    Fear of  Current or Ex-Partner: No    Emotionally Abused: No    Physically Abused: No    Sexually Abused: No     Family History  Problem Relation Age of Onset   Hypertension Mother    Multiple myeloma Mother    Hypertension Father    Diabetes Father     ROS: [x]  Positive   [ ]  Negative   [ ]  All sytems reviewed and are negative  Cardiovascular: []  chest pain/pressure []  palpitations []  SOB lying flat []  DOE []  pain in legs while walking []  pain in legs at rest []  pain in legs at night []  non-healing ulcers []  hx of DVT [x]  swelling in legs - left  Pulmonary: []  productive cough []  asthma/wheezing []  home O2  Neurologic: []  weakness in []  arms []  legs []  numbness in []  arms []  legs []  hx of CVA []  mini stroke [] difficulty speaking or slurred speech []  temporary loss of vision in one eye []  dizziness  Hematologic: []  hx of cancer []  bleeding problems []  problems with blood clotting easily  Endocrine:   []  diabetes []  thyroid  disease  GI []  vomiting blood []  blood in stool  GU: []  CKD/renal failure []  HD--[]  M/W/F or []  T/T/S []  burning with urination []  blood in urine  Psychiatric: []  anxiety []  depression  Musculoskeletal: []   arthritis []  joint pain  Integumentary: []  rashes []  ulcers  Constitutional: []  fever []  chills   Physical Examination  Vitals:   06/25/23 1300 06/25/23 1410  BP: 123/74 137/74  Pulse: 78 80  Resp: 20 18  Temp:  98.6 F (37 C)  SpO2: 100% 100%   Body mass index is 29.37 kg/m.  General:  WDWN in NAD Gait: Not observed HENT: WNL, normocephalic Pulmonary: normal non-labored breathing, without Rales, rhonchi,  wheezing Cardiac: regular, without  Murmurs, rubs or gallops Abdomen:  soft, NT/ND Vascular Exam/Pulses: Notable left lower extremity edema marked compared to the right Bilateral DP pulses palpable Extremities: without ischemic changes Musculoskeletal: no muscle wasting or atrophy  Neurologic: A&O X 3; Appropriate Affect ; SENSATION: normal; MOTOR FUNCTION:  moving all extremities equally. Speech is fluent/normal   CBC    Component Value Date/Time   WBC 2.9 (L) 06/24/2023 1546   RBC 2.85 (L) 06/24/2023 1546   HGB 7.9 (L) 06/24/2023 1546   HGB 8.2 (L) 06/22/2023 1142   HGB 12.1 (L) 03/31/2022 1659   HCT 24.5 (L) 06/24/2023 1546   HCT 36.0 (L) 03/31/2022 1659   PLT 134 (L) 06/24/2023 1546   PLT 99 (L) 06/22/2023 1142   PLT 236 03/31/2022 1659   MCV 86.0 06/24/2023 1546   MCV 85 03/31/2022 1659   MCH 27.7 06/24/2023 1546   MCHC 32.2 06/24/2023 1546   RDW 16.1 (H) 06/24/2023 1546   RDW 13.2 03/31/2022 1659   LYMPHSABS 1.2 06/22/2023 1142   MONOABS 0.4 06/22/2023 1142   EOSABS 0.0 06/22/2023 1142   BASOSABS 0.0 06/22/2023 1142    BMET    Component Value Date/Time   NA 133 (L) 06/24/2023 1546   NA 137 11/05/2022 1152   K 5.0 06/24/2023 1546   CL 103 06/24/2023 1546   CO2 21 (L) 06/24/2023 1546   GLUCOSE 231 (H) 06/24/2023 1546   BUN 21 06/24/2023 1546   BUN 11 11/05/2022 1152   CREATININE 1.81 (H) 06/24/2023 1546   CREATININE 1.50 (H) 06/22/2023 1142   CALCIUM  9.2 06/24/2023 1546   GFRNONAA 40 (L) 06/24/2023 1546   GFRNONAA 50 (L)  06/22/2023  1142   GFRAA 70 08/06/2020 1044    COAGS: Lab Results  Component Value Date   INR 1.1 03/16/2023   INR 1.32 06/13/2011   INR 1.06 06/12/2011     Non-Invasive Vascular Imaging:      Lower Venous DVT Study  Patient Name:  NEVAN CREIGHTON  Date of Exam:   06/24/2023 Medical Rec #: [id number]        Accession #:    [id number] Date of Birth: [birth year]        Patient Gender: M Patient Age:   25 years Exam Location:  [facility] Procedure:      VAS US  LOWER EXTREMITY VENOUS (DVT) Referring Phys: DAN FLOYD   --------------------------------------------------------------------------- -----   Indications: Swelling. Other Indications: Shortness of breath when using stairs.  Risk Factors: Lung cancer / chemotherapy. Comparison Study: No previous exams  Performing Technologist: Jody Hill RVT, RDMS    Examination Guidelines: A complete evaluation includes B-mode imaging, spectral Doppler, color Doppler, and power Doppler as needed of all accessible portions of each vessel. Bilateral testing is considered an integral part of a complete examination. Limited examinations for reoccurring indications may be performed as noted. The reflux portion of the exam is performed with the patient in reverse Trendelenburg.     +-----+---------------+---------+-----------+----------+--------------+ RIGHTCompressibilityPhasicitySpontaneityPropertiesThrombus Aging +-----+---------------+---------+-----------+----------+--------------+ CFV  Full           Yes      Yes                                 +-----+---------------+---------+-----------+----------+--------------+        +--------+---------------+---------+-----------+----------+---------------- ----+ LEFT    CompressibilityPhasicitySpontaneityPropertiesThrombus Aging        +--------+---------------+---------+-----------+----------+---------------- ----+ CFV     None                                                                +--------+---------------+---------+-----------+----------+---------------- ----+ SFJ     None                                                               +--------+---------------+---------+-----------+----------+---------------- ----+ FV Prox None           No       No                                         +--------+---------------+---------+-----------+----------+---------------- ----+ FV Mid  None           No       No                                         +--------+---------------+---------+-----------+----------+---------------- ----+ FV      None           No       No  Distal                                                                     +--------+---------------+---------+-----------+----------+---------------- ----+ PFV     None           No       No                                         +--------+---------------+---------+-----------+----------+---------------- ----+ POP     None           No       No                                         +--------+---------------+---------+-----------+----------+---------------- ----+ PTV     Full                                                               +--------+---------------+---------+-----------+----------+---------------- ----+ PERO    Full                                                               +--------+---------------+---------+-----------+----------+---------------- ----+ GSV     None           No       No                   Acute (origin /                                                           prox)                +--------+---------------+---------+-----------+----------+---------------- ----+ EIV     None           No       No                   Acute - distal                                                             portion               +--------+---------------+---------+-----------+----------+---------------- ----+  Partial appearing thrombus in mid EIV  Summary: RIGHT: - No evidence of common femoral vein obstruction.        LEFT: - Findings consistent with acute deep vein thrombosis involving the left common femoral vein, SF junction, left femoral vein, left proximal profunda vein, left popliteal vein, and external iliac (mid and distal).   No cystic structure found in the popliteal fossa. Subcutaneous edema noted in calf and ankle.    *See table(s) above for measurements and observations.  Electronically signed by Penne Colorado MD on 06/25/2023 at 8:12:00 AM.     ASSESSMENT/PLAN: This is a 68 y.o. 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  Venous duplex does confirm extensive left leg DVT extending proximally into the mid external iliac vein.  He is fairly symptomatic with notable left lower extremity edema.  I discussed the option of anticoagulation alone versus percutaneous mechanical thrombectomy to prevent post thrombotic syndrome and alleviate his symptoms.  He wishes to proceed with percutaneous thrombectomy after discussing risk and benefits and indications.  Will post tomorrow for the Cath Lab.  N.p.o. after midnight.  Consent order placed.  Again discussed will still require long-term anticoagulation.  Associated risk factor is recent lung cancer for which he has undergone lobectomy and now chemotherapy.  Lonni DOROTHA Gaskins, MD [facility] Office: [phone]  Lonni JINNY Gaskins

## 2023-06-25 NOTE — TOC CM/SW Note (Signed)
 Transition of Care [facility]) - Inpatient Brief Assessment   Patient Details  Name: William Mcguire MRN: [id number] Date of Birth: 1956-03-06  Transition of Care [facility]) CM/SW Contact:    Sudie Erminio Deems, RN Phone Number: 06/25/2023, 3:22 PM   Clinical Narrative: Patient presented for leg swelling- found to have LLE DVT. PTA patient was independent from home. Case Manager will continue to follow for transition of care needs as the patient progresses.   Transition of Care Asessment: Insurance and Status: Insurance coverage has been reviewed Patient has primary care physician: Yes Home environment has been reviewed: reviewed Prior level of function:: independent Prior/Current Home Services: No current home services Social Drivers of Health Review: SDOH reviewed no interventions necessary Readmission risk has been reviewed: Yes Transition of care needs: no transition of care needs at this time

## 2023-06-26 ENCOUNTER — Other Ambulatory Visit: Payer: Self-pay | Admitting: Physician Assistant

## 2023-06-26 ENCOUNTER — Encounter: Payer: Self-pay | Admitting: Internal Medicine

## 2023-06-26 ENCOUNTER — Encounter (HOSPITAL_COMMUNITY)
Admission: EM | Disposition: A | Discharge status: Home / Self Care | Payer: Self-pay | Source: Home / Self Care | Attending: Internal Medicine

## 2023-06-26 DIAGNOSIS — E1122 Type 2 diabetes mellitus with diabetic chronic kidney disease: Secondary | ICD-10-CM | POA: Diagnosis present

## 2023-06-26 DIAGNOSIS — I82412 Acute embolism and thrombosis of left femoral vein: Secondary | ICD-10-CM

## 2023-06-26 DIAGNOSIS — I871 Compression of vein: Secondary | ICD-10-CM | POA: Diagnosis present

## 2023-06-26 DIAGNOSIS — I2699 Other pulmonary embolism without acute cor pulmonale: Secondary | ICD-10-CM | POA: Diagnosis present

## 2023-06-26 DIAGNOSIS — N1831 Chronic kidney disease, stage 3a: Secondary | ICD-10-CM | POA: Diagnosis present

## 2023-06-26 DIAGNOSIS — D630 Anemia in neoplastic disease: Secondary | ICD-10-CM | POA: Diagnosis present

## 2023-06-26 DIAGNOSIS — Z807 Family history of other malignant neoplasms of lymphoid, hematopoietic and related tissues: Secondary | ICD-10-CM | POA: Diagnosis not present

## 2023-06-26 DIAGNOSIS — E7800 Pure hypercholesterolemia, unspecified: Secondary | ICD-10-CM | POA: Diagnosis present

## 2023-06-26 DIAGNOSIS — I82422 Acute embolism and thrombosis of left iliac vein: Secondary | ICD-10-CM

## 2023-06-26 DIAGNOSIS — Z7982 Long term (current) use of aspirin: Secondary | ICD-10-CM | POA: Diagnosis not present

## 2023-06-26 DIAGNOSIS — M7989 Other specified soft tissue disorders: Secondary | ICD-10-CM | POA: Diagnosis present

## 2023-06-26 DIAGNOSIS — I2694 Multiple subsegmental pulmonary emboli without acute cor pulmonale: Secondary | ICD-10-CM | POA: Diagnosis present

## 2023-06-26 DIAGNOSIS — Z79899 Other long term (current) drug therapy: Secondary | ICD-10-CM | POA: Diagnosis not present

## 2023-06-26 DIAGNOSIS — E11649 Type 2 diabetes mellitus with hypoglycemia without coma: Secondary | ICD-10-CM | POA: Diagnosis not present

## 2023-06-26 DIAGNOSIS — I129 Hypertensive chronic kidney disease with stage 1 through stage 4 chronic kidney disease, or unspecified chronic kidney disease: Secondary | ICD-10-CM | POA: Diagnosis present

## 2023-06-26 DIAGNOSIS — Z902 Acquired absence of lung [part of]: Secondary | ICD-10-CM | POA: Diagnosis not present

## 2023-06-26 DIAGNOSIS — C3412 Malignant neoplasm of upper lobe, left bronchus or lung: Secondary | ICD-10-CM | POA: Diagnosis present

## 2023-06-26 DIAGNOSIS — Z794 Long term (current) use of insulin: Secondary | ICD-10-CM | POA: Diagnosis not present

## 2023-06-26 DIAGNOSIS — Z833 Family history of diabetes mellitus: Secondary | ICD-10-CM | POA: Diagnosis not present

## 2023-06-26 DIAGNOSIS — Z7901 Long term (current) use of anticoagulants: Secondary | ICD-10-CM | POA: Diagnosis not present

## 2023-06-26 DIAGNOSIS — Z8249 Family history of ischemic heart disease and other diseases of the circulatory system: Secondary | ICD-10-CM | POA: Diagnosis not present

## 2023-06-26 HISTORY — PX: PERIPHERAL VASCULAR THROMBECTOMY: CATH118306

## 2023-06-26 HISTORY — PX: PERIPHERAL VASCULAR INTERVENTION: CATH118257

## 2023-06-26 HISTORY — PX: PERIPHERAL VASCULAR ULTRASOUND/IVUS: CATH118334

## 2023-06-26 LAB — GLUCOSE, CAPILLARY
Glucose-Capillary: 105 mg/dL — ABNORMAL HIGH (ref 70–99)
Glucose-Capillary: 43 mg/dL — CL (ref 70–99)
Glucose-Capillary: 87 mg/dL (ref 70–99)
Glucose-Capillary: 36 mg/dL — CL (ref 70–99)
Glucose-Capillary: 136 mg/dL — ABNORMAL HIGH (ref 70–99)
Glucose-Capillary: 29 mg/dL — CL (ref 70–99)
Glucose-Capillary: 183 mg/dL — ABNORMAL HIGH (ref 70–99)
Glucose-Capillary: 208 mg/dL — ABNORMAL HIGH (ref 70–99)
Glucose-Capillary: 197 mg/dL — ABNORMAL HIGH (ref 70–99)

## 2023-06-26 LAB — CBC WITH DIFFERENTIAL/PLATELET
Abs Immature Granulocytes: 0.15 10*3/uL — ABNORMAL HIGH (ref 0.00–0.07)
Basophils Absolute: 0 10*3/uL (ref 0.0–0.1)
Basophils Relative: 0 %
Eosinophils Absolute: 0.1 10*3/uL (ref 0.0–0.5)
Eosinophils Relative: 3 %
HCT: 22.7 % — ABNORMAL LOW (ref 39.0–52.0)
Hemoglobin: 7.4 g/dL — ABNORMAL LOW (ref 13.0–17.0)
Immature Granulocytes: 4 %
Lymphocytes Relative: 45 %
Lymphs Abs: 1.6 10*3/uL (ref 0.7–4.0)
MCH: 27.7 pg (ref 26.0–34.0)
MCHC: 32.6 g/dL (ref 30.0–36.0)
MCV: 85 fL (ref 80.0–100.0)
Monocytes Absolute: 0.8 10*3/uL (ref 0.1–1.0)
Monocytes Relative: 23 %
Neutro Abs: 0.9 10*3/uL — ABNORMAL LOW (ref 1.7–7.7)
Neutrophils Relative %: 25 %
Platelets: 165 10*3/uL (ref 150–400)
RBC: 2.67 MIL/uL — ABNORMAL LOW (ref 4.22–5.81)
RDW: 16.3 % — ABNORMAL HIGH (ref 11.5–15.5)
WBC: 3.5 10*3/uL — ABNORMAL LOW (ref 4.0–10.5)
nRBC: 0 % (ref 0.0–0.2)

## 2023-06-26 LAB — RENAL FUNCTION PANEL
Albumin: 2.3 g/dL — ABNORMAL LOW (ref 3.5–5.0)
Anion gap: 6 (ref 5–15)
BUN: 18 mg/dL (ref 8–23)
CO2: 22 mmol/L (ref 22–32)
Calcium: 8.8 mg/dL — ABNORMAL LOW (ref 8.9–10.3)
Chloride: 104 mmol/L (ref 98–111)
Creatinine, Ser: 1.65 mg/dL — ABNORMAL HIGH (ref 0.61–1.24)
GFR, Estimated: 45 mL/min — ABNORMAL LOW (ref >60)
Glucose, Bld: 75 mg/dL (ref 70–99)
Phosphorus: 4.5 mg/dL (ref 2.5–4.6)
Potassium: 4.5 mmol/L (ref 3.5–5.1)
Sodium: 132 mmol/L — ABNORMAL LOW (ref 135–145)

## 2023-06-26 LAB — HEPARIN LEVEL (UNFRACTIONATED): Heparin Unfractionated: 0.94 [IU]/mL — ABNORMAL HIGH (ref 0.30–0.70)

## 2023-06-26 LAB — GLUCOSE, RANDOM: Glucose, Bld: 99 mg/dL (ref 70–99)

## 2023-06-26 SURGERY — PERIPHERAL VASCULAR THROMBECTOMY
Anesthesia: LOCAL | Laterality: Left

## 2023-06-26 MED ORDER — MIDAZOLAM HCL 2 MG/2ML IJ SOLN
INTRAMUSCULAR | Status: DC | PRN
  Administered 2023-06-26: 1 mg via INTRAVENOUS

## 2023-06-26 MED ORDER — HEPARIN (PORCINE) 25000 UT/250ML-% IV SOLN
1200.0000 [IU]/h | INTRAVENOUS | Status: DC
  Administered 2023-06-26: 1700 [IU]/h via INTRAVENOUS
  Administered 2023-06-27: 1500 [IU]/h via INTRAVENOUS
  Administered 2023-06-28: 1200 [IU]/h via INTRAVENOUS
  Filled 2023-06-26 (×3): qty 250

## 2023-06-26 MED ORDER — IODIXANOL 320 MG/ML IV SOLN
INTRAVENOUS | Status: DC | PRN
  Administered 2023-06-26: 45 mL

## 2023-06-26 MED ORDER — SODIUM CHLORIDE 0.9 % IV SOLN: 250.0000 mL | INTRAVENOUS | Status: AC | PRN

## 2023-06-26 MED ORDER — SODIUM CHLORIDE 0.9 % WEIGHT BASED INFUSION
1.0000 mL/kg/h | INTRAVENOUS | Status: AC
Start: 2023-06-26 — End: 2023-06-27

## 2023-06-26 MED ORDER — DEXTROSE 50 % IV SOLN
25.0000 g | INTRAVENOUS | Status: DC
  Filled 2023-06-26: qty 50

## 2023-06-26 MED ORDER — FENTANYL CITRATE (PF) 100 MCG/2ML IJ SOLN
INTRAMUSCULAR | Status: DC | PRN
  Administered 2023-06-26: 50 ug via INTRAVENOUS

## 2023-06-26 MED ORDER — ASPIRIN 81 MG PO TBEC
81.0000 mg | DELAYED_RELEASE_TABLET | Freq: Every day | ORAL | Status: DC
  Administered 2023-06-26 – 2023-06-28 (×3): 81 mg via ORAL
  Filled 2023-06-26 (×3): qty 1

## 2023-06-26 MED ORDER — HEPARIN SODIUM (PORCINE) 1000 UNIT/ML IJ SOLN
INTRAMUSCULAR | Status: DC | PRN
  Administered 2023-06-26: 10000 [IU] via INTRAVENOUS

## 2023-06-26 MED ORDER — FENTANYL CITRATE (PF) 100 MCG/2ML IJ SOLN
INTRAMUSCULAR | Status: AC
  Filled 2023-06-26: qty 2

## 2023-06-26 MED ORDER — SODIUM CHLORIDE 0.9 % IV SOLN: INTRAVENOUS | Status: DC

## 2023-06-26 MED ORDER — SODIUM CHLORIDE 0.9% FLUSH: 3.0000 mL | INTRAVENOUS | Status: DC | PRN

## 2023-06-26 MED ORDER — LIDOCAINE HCL (PF) 1 % IJ SOLN
INTRAMUSCULAR | Status: DC | PRN
  Administered 2023-06-26: 5 mL

## 2023-06-26 MED ORDER — ACETAMINOPHEN 325 MG PO TABS: 650.0000 mg | ORAL_TABLET | ORAL | Status: DC | PRN

## 2023-06-26 MED ORDER — LABETALOL HCL 5 MG/ML IV SOLN: 10.0000 mg | INTRAVENOUS | Status: DC | PRN

## 2023-06-26 MED ORDER — SODIUM CHLORIDE 0.9% FLUSH
3.0000 mL | Freq: Two times a day (BID) | INTRAVENOUS | Status: DC
Start: 2023-06-27 — End: 2023-06-28
  Administered 2023-06-27 – 2023-06-28 (×3): 3 mL via INTRAVENOUS

## 2023-06-26 MED ORDER — DEXTROSE 50 % IV SOLN
INTRAVENOUS | Status: AC
  Administered 2023-06-26: 50 mL
  Filled 2023-06-26: qty 50

## 2023-06-26 MED ORDER — DEXTROSE 10 % IV SOLN: INTRAVENOUS | Status: DC

## 2023-06-26 MED ORDER — LIDOCAINE HCL (PF) 1 % IJ SOLN
INTRAMUSCULAR | Status: AC
  Filled 2023-06-26: qty 30

## 2023-06-26 MED ORDER — HYDRALAZINE HCL 20 MG/ML IJ SOLN: 5.0000 mg | INTRAMUSCULAR | Status: DC | PRN

## 2023-06-26 MED ORDER — MIDAZOLAM HCL 2 MG/2ML IJ SOLN
INTRAMUSCULAR | Status: AC
  Filled 2023-06-26: qty 2

## 2023-06-26 MED ORDER — DEXTROSE 50 % IV SOLN: 50.0000 mL | Freq: Once | INTRAVENOUS | Status: DC

## 2023-06-26 MED ORDER — HEPARIN SODIUM (PORCINE) 1000 UNIT/ML IJ SOLN
INTRAMUSCULAR | Status: AC
  Filled 2023-06-26: qty 10

## 2023-06-26 MED ORDER — HEPARIN (PORCINE) IN NACL 1000-0.9 UT/500ML-% IV SOLN
INTRAVENOUS | Status: DC | PRN
  Administered 2023-06-26: 500 mL

## 2023-06-26 MED ORDER — DEXTROSE 50 % IV SOLN
25.0000 g | INTRAVENOUS | Status: AC
  Administered 2023-06-26: 25 g via INTRAVENOUS

## 2023-06-26 MED FILL — Fosaprepitant Dimeglumine For IV Infusion 150 MG (Base Eq): INTRAVENOUS | Qty: 5 | Status: AC

## 2023-06-26 SURGICAL SUPPLY — 17 items
BAG SNAP BAND KOVER 36X36 (MISCELLANEOUS) IMPLANT
BALLN ATLAS 16X40X75 (BALLOONS) ×2
BALLOON ATLAS 16X40X75 (BALLOONS) IMPLANT
CANISTER PENUMBRA ENGINE (MISCELLANEOUS) IMPLANT
CATH LIGHTNI FLASH 16XTORQ 100 (CATHETERS) IMPLANT
CATH VISIONS PV .035 IVUS (CATHETERS) IMPLANT
COVER DOME SNAP 22 D (MISCELLANEOUS) IMPLANT
GLIDEWIRE ADV .035X260CM (WIRE) IMPLANT
KIT ENCORE 26 ADVANTAGE (KITS) IMPLANT
KIT MICROPUNCTURE NIT STIFF (SHEATH) IMPLANT
PROTECTION STATION PRESSURIZED (MISCELLANEOUS) ×2
SHEATH PINNACLE 5F 10CM (SHEATH) IMPLANT
SHEATH PINNACLE 8F 10CM (SHEATH) IMPLANT
SHEATH PROBE COVER 6X72 (BAG) IMPLANT
STATION PROTECTION PRESSURIZED (MISCELLANEOUS) IMPLANT
STENT VENOUS ABRE 18X100 (Permanent Stent) IMPLANT
WIRE BENTSON .035X145CM (WIRE) IMPLANT

## 2023-06-26 NOTE — Progress Notes (Signed)
 PROGRESS NOTE    Charod Slawinski  FMW:[id number] DOB: 07/29/1955 DOA: 06/24/2023 PCP: Jarold Medici, MD  Outpatient Specialists:     Brief Narrative:  Patient is a 68 year old male with past medical history significant for adenocarcinoma of left lung (stage II), status post lobectomy and currently on chemotherapy; diabetes mellitus type 2, hypertension, hyperlipidemia and chronic kidney disease stage III.  Patient has never smoked cigarettes.  Patient was admitted with extensive DVT of left lower extremity and acute pulmonary embolism.  Patient has been on subcutaneous Lovenox .  Vascular surgery team was consulted for possible mechanical thrombectomy.  06/26/2023: Patient seen alongside patient's wife.  Patient is awaiting mechanical thrombectomy by the vascular surgery team.  No new complaints.   Assessment & Plan:   Principal Problem:   Acute pulmonary embolism without acute cor pulmonale (HCC) Active Problems:   T2DM (type 2 diabetes mellitus) (HCC)   HTN (hypertension)   Adenocarcinoma of left lung (HCC)   CKD stage 3a, GFR 45-59 ml/min (HCC)   Acute deep vein thrombosis (DVT) of femoral vein of left lower extremity (HCC)   Multiple subsegmental pulmonary emboli without acute cor pulmonale (HCC)   Acute pulmonary embolism (HCC)   Acute pulmonary embolism without acute cor pulmonale (HCC)/extensive lower extremity DVT: -Patient has been on subcutaneous Lovenox . -Input from the hematology/oncology team is appreciated. -Patient is known to Dr. Gatha Gatha, local oncologist. -Patient is status longer lobectomy for stage II lung CVA and currently on chemotherapy (NSCLC adenocarcinoma).   -For mechanical thrombectomy and possible thrombolysis by the vascular surgery team.   -Patient will follow-up with hematology/oncology team on discharge.   Adenocarcinoma of left lung (HCC) On adjuvant chemo, they had throught they had gotten all of the tumor with lobectomy in Sept. No  definite evidence to suggest recurrence on CTA chest: has some focus of nodularity in lung but this is unchanged compared to prior imaging. Still, unprovoked PE does raise concern / question.   CKD stage 3a, GFR 45-59 ml/min (HCC) -Stable.  HTN (hypertension) Cont amlodipine , enalapril    T2DM (type 2 diabetes mellitus) (HCC) Cont Lantus  21u at bedtime Sensitive SSI AC for the moment Has been holding trulicity  for procedure (supposed to have screening colonoscopy tomorrow), will leave this on hold for the moment 06/26/2023: Hypoglycemia noted today.  We managed with D50 and D10.  Monitor blood sugar closely.   DVT prophylaxis: Subcutaneous Lovenox . Code Status: Full code Family Communication: Wife Disposition Plan: Eventually   Consultants:  Vascular surgery Hematology/oncology  Procedures:  Mechanical thrombectomy is planned.  Antimicrobials:  None.   Subjective: No shortness of breath. No chest pain.  Objective: Vitals:   06/26/23 1218 06/26/23 1223 06/26/23 1228 06/26/23 1307  BP: 118/76 105/68 114/66 117/64  Pulse: 73 85 70 69  Resp: 14 16 16 17   Temp:    98.5 F (36.9 C)  TempSrc:    Oral  SpO2: (!) 86% 100% 99%   Weight:      Height:        Intake/Output Summary (Last 24 hours) at 06/26/2023 1942 Last data filed at 06/26/2023 1814 Gross per 24 hour  Intake 808.33 ml  Output 200 ml  Net 608.33 ml   Filed Weights   06/25/23 1410  Weight: 106.6 kg    Examination:  General exam: Appears calm and comfortable  Respiratory system: Clear to auscultation.  Cardiovascular system: S1 & S2 heard. Gastrointestinal system: Abdomen is soft and nontender. Central nervous system: Awake and alert.  Moves all  extremities.   Extremities: Left lower extremity swelling.  Data Reviewed: I have personally reviewed following labs and imaging studies  CBC: Recent Labs  Lab 06/22/23 1142 06/24/23 1546 06/26/23 0506  WBC 3.8* 2.9* 3.5*  NEUTROABS 2.2  --  0.9*  HGB  8.2* 7.9* 7.4*  HCT 24.9* 24.5* 22.7*  MCV 85.3 86.0 85.0  PLT 99* 134* 165   Basic Metabolic Panel: Recent Labs  Lab 06/22/23 1142 06/24/23 1546 06/26/23 0506 06/26/23 1328  NA 130* 133* 132*  --   K 4.5 5.0 4.5  --   CL 103 103 104  --   CO2 23 21* 22  --   GLUCOSE 158* 231* 75 99  BUN 22 21 18   --   CREATININE 1.50* 1.81* 1.65*  --   CALCIUM  9.4 9.2 8.8*  --   PHOS  --   --  4.5  --    GFR: Estimated Creatinine Clearance: 55.8 mL/min (A) (by C-G formula based on SCr of 1.65 mg/dL (H)). Liver Function Tests: Recent Labs  Lab 06/22/23 1142 06/26/23 0506  AST 28  --   ALT 22  --   ALKPHOS 119  --   BILITOT 0.4  --   PROT 9.4*  --   ALBUMIN 3.3* 2.3*   No results for input(s): LIPASE, AMYLASE in the last 168 hours. No results for input(s): AMMONIA in the last 168 hours. Coagulation Profile: No results for input(s): INR, PROTIME in the last 168 hours. Cardiac Enzymes: No results for input(s): CKTOTAL, CKMB, CKMBINDEX, TROPONINI in the last 168 hours. BNP (last 3 results) No results for input(s): PROBNP in the last 8760 hours. HbA1C: No results for input(s): HGBA1C in the last 72 hours. CBG: Recent Labs  Lab 06/26/23 1312 06/26/23 1343 06/26/23 1347 06/26/23 1549 06/26/23 1640  GLUCAP 36* 29* 136* 183* 208*   Lipid Profile: No results for input(s): CHOL, HDL, LDLCALC, TRIG, CHOLHDL, LDLDIRECT in the last 72 hours. Thyroid  Function Tests: No results for input(s): TSH, T4TOTAL, FREET4, T3FREE, THYROIDAB in the last 72 hours. Anemia Panel: No results for input(s): VITAMINB12, FOLATE, FERRITIN, TIBC, IRON, RETICCTPCT in the last 72 hours. Urine analysis:    Component Value Date/Time   COLORURINE YELLOW 05/08/2023 1347   APPEARANCEUR CLEAR 05/08/2023 1347   LABSPEC 1.015 05/08/2023 1347   PHURINE 6.0 05/08/2023 1347   GLUCOSEU NEGATIVE 05/08/2023 1347   HGBUR SMALL (A) 05/08/2023 1347   BILIRUBINUR  NEGATIVE 05/08/2023 1347   BILIRUBINUR Negative 08/18/2022 1616   KETONESUR NEGATIVE 05/08/2023 1347   PROTEINUR NEGATIVE 05/08/2023 1347   UROBILINOGEN 0.2 08/18/2022 1616   UROBILINOGEN 0.2 06/27/2011 1205   NITRITE NEGATIVE 05/08/2023 1347   LEUKOCYTESUR TRACE (A) 05/08/2023 1347   Sepsis Labs: @LABRCNTIP (procalcitonin:4,lacticidven:4)  )No results found for this or any previous visit (from the past 240 hours).       Radiology Studies: PERIPHERAL VASCULAR CATHETERIZATION Result Date: 06/26/2023 DATE OF SERVICE: 06/26/2023  PATIENT:  William Mcguire  68 y.o. male  PRE-OPERATIVE DIAGNOSIS:  left lower extremity iliofemoral deep venous thrombosis  POST-OPERATIVE DIAGNOSIS:  Same; may-thurner syndrome  PROCEDURE:  1) Ultrasound guided left popliteal vein access 2) intravascular ultrasound of inferior vena cava, left common iliac vein, left external iliac vein, left common femoral vein, left femoral vein 3) left lower extremity and abdominal inferior vena cava venogram 4) endovascular mechanical thrombectomy of inferior vena cava, left common iliac vein, left external iliac vein, left common femoral vein, left femoral veindeep venous thrombosis using CAT  16 penumbra device. 5) left common iliac vein angioplasty stenting (18 x 100 mm Abre) 6) Conscious sedation (50 minutes)  CPT: 23062, 37252, 37253 x 4, 75820, 75825, 37184, 37185 x 4, 37238, 99152, 00846 x 3  SURGEON:  Debby SAILOR. Magda, MD  ASSISTANT: none  ANESTHESIA:   local and IV sedation  ESTIMATED BLOOD LOSS: minimal  LOCAL MEDICATIONS USED:  LIDOCAINE   COUNTS: confirmed correct.  PATIENT DISPOSITION:  PACU - hemodynamically stable.  Delay start of Pharmacological VTE agent (>24hrs) due to surgical blood loss or risk of bleeding: no  INDICATION FOR PROCEDURE: Jahid Weida is a 68 y.o. male with extensive left lower extremity deep venous thrombosis. After careful discussion of risks, benefits, and alternatives the patient was offered left  lower extremity thrombectomy. The patient understood and wished to proceed.  OPERATIVE FINDINGS: Occlusive thrombus of the left lower extremity from the popliteal vein to the external iliac vein, as suggested by preoperative duplex.  Good technical result from thrombectomy.  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  After thrombectomy, nearly all thrombus was cleared.  Improved flow was noted through the left lower extremity.  The stenosis persisted, and so I elected to stent this.  Good technical result from angioplasty and stenting on both IVUS and venogram.  DESCRIPTION OF PROCEDURE: After identification of the patient in the pre-operative holding area, the patient was transferred to the operating room. The patient was positioned supine on the operating room table. Anesthesia was induced. The groins was prepped and draped in standard fashion. A surgical pause was performed confirming correct patient, procedure, and operative location.  The skin overlaying the left popliteal vein was anesthetized with 1% lidocaine .  Micropuncture access was obtained in the left popliteal vein.  Using Seldinger technique, the micro sheath was introduced into the vein.  A Glidewire advantage was navigated into the inferior vena cava without difficulty.  Access was upsized to 8 French.  Intravascular ultrasound was performed of the inferior vena cava, left common iliac vein, left external iliac vein, left common femoral vein, left femoral vein.  Ascending venogram was performed.  Both showed occlusive thrombus in the leg.  Patient was systemically heparinized.   Access was upsized to a 16 French dry seal.  A CAT 16 penumbra thrombectomy device was used to perform thrombectomy.  Good technical result was achieved.  Nearly all thrombus was removed.  Repeat venogram and ultrasound was performed.  Good technical result was achieved from thrombectomy.  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  I used intravascular ultrasound to measure the diameter of the healthy appearing common iliac vein.  This measured 16 mm.  I selected an 18 mm x 100 mm Abre stent and deployed this across the lesion overhang into the inferior vena cava.  I angioplastied this with a 16 mm Atlas balloon.  Repeat venogram and intravascular ultrasound was performed showing good technical result from intervention.  All endovascular equipment was removed.  A U-stitch was applied to the access site with a bolster.  Hemostasis was achieved.  The left leg was wrapped  Conscious sedation was administered with the use of IV fentanyl  and midazolam  under continuous physician and nurse monitoring.  Heart rate, blood pressure, and oxygen saturation were continuously monitored.  Total sedation time was 50 minutes  Upon completion of the case instrument and sharps counts were confirmed correct. The patient was transferred  to the PACU in good condition. I was present for all portions of the procedure.  PLAN: Start heparin  infusion.  Okay to start DOAC tomorrow if access site is hemostatic.  Patient needs aspirin  and DOAC for at least 6 months.  Follow-up with me in 4-6 weeks.  Debby SAILOR. Magda, MD [facility] Phone Number: [phone] 06/26/2023 1:33 PM   ECHOCARDIOGRAM COMPLETE Result Date: 06/25/2023    ECHOCARDIOGRAM REPORT   Patient Name:   AKILI CORSETTI Date of Exam: 06/25/2023 Medical Rec #:  [id number]       Height:       75.0 in Accession #:    [id number]      Weight:       235.0 lb Date of Birth:  04-Jan-1956       BSA:          2.350 m Patient Age:    67 years        BP:           123/74 mmHg Patient Gender: M               HR:           73 bpm. Exam Location:  Inpatient Procedure: 2D Echo, Cardiac Doppler and Color Doppler Indications:     I26.02 Pulmonary embolus  History:         Patient has no prior history  of Echocardiogram examinations.                  Risk Factors:Diabetes, Hypertension and Dyslipidemia.  Sonographer:     Damien Senior RDCS Referring Phys:  [phone] JARED M GARDNER Diagnosing Phys: Ezra McleanMD IMPRESSIONS  1. Left ventricular ejection fraction, by estimation, is 60 to 65%. The left ventricle has normal function. The left ventricle has no regional wall motion abnormalities. Left ventricular diastolic parameters are consistent with Grade I diastolic dysfunction (impaired relaxation).  2. Right ventricular systolic function is normal. The right ventricular size is mildly enlarged. There is normal pulmonary artery systolic pressure. The estimated right ventricular systolic pressure is 13.9 mmHg.  3. The mitral valve is normal in structure. Trivial mitral valve regurgitation. No evidence of mitral stenosis.  4. The aortic valve is tricuspid. Aortic valve regurgitation is not visualized. No aortic stenosis is present.  5. There is mild dilatation of the ascending aorta, measuring 41 mm.  6. The inferior vena cava is normal in size with greater than 50% respiratory variability, suggesting right atrial pressure of 3 mmHg. FINDINGS  Left Ventricle: Left ventricular ejection fraction, by estimation, is 60 to 65%. The left ventricle has normal function. The left ventricle has no regional wall motion abnormalities. The left ventricular internal cavity size was normal in size. There is  borderline concentric left ventricular hypertrophy. Left ventricular diastolic parameters are consistent with Grade I diastolic dysfunction (impaired relaxation). Right Ventricle: The right ventricular size is mildly enlarged. No increase in right ventricular wall thickness. Right ventricular systolic function is normal. There is normal pulmonary artery systolic pressure. The tricuspid regurgitant velocity is 1.65  m/s, and with an assumed right atrial pressure of 3 mmHg, the estimated right ventricular systolic pressure is 13.9  mmHg. Left Atrium: Left atrial size was normal in size. Right Atrium: Right atrial size was normal in size. Pericardium: There is no evidence of pericardial effusion. Mitral Valve: The mitral valve is normal in structure. Trivial mitral valve regurgitation. No evidence of mitral valve stenosis.  Tricuspid Valve: The tricuspid valve is normal in structure. Tricuspid valve regurgitation is trivial. Aortic Valve: The aortic valve is tricuspid. Aortic valve regurgitation is not visualized. No aortic stenosis is present. Pulmonic Valve: The pulmonic valve was normal in structure. Pulmonic valve regurgitation is trivial. Aorta: The aortic root is normal in size and structure. There is mild dilatation of the ascending aorta, measuring 41 mm. Venous: The inferior vena cava is normal in size with greater than 50% respiratory variability, suggesting right atrial pressure of 3 mmHg. IAS/Shunts: The atrial septum is grossly normal.  LEFT VENTRICLE PLAX 2D LVIDd:         4.60 cm   Diastology LVIDs:         2.50 cm   LV e' medial:    6.42 cm/s LV PW:         1.10 cm   LV E/e' medial:  12.0 LV IVS:        1.00 cm   LV e' lateral:   9.68 cm/s LVOT diam:     2.10 cm   LV E/e' lateral: 7.9 LV SV:         72 LV SV Index:   31 LVOT Area:     3.46 cm  RIGHT VENTRICLE RV S prime:     12.70 cm/s LEFT ATRIUM             Index        RIGHT ATRIUM           Index LA diam:        2.70 cm 1.15 cm/m   RA Area:     16.30 cm LA Vol (A2C):   45.9 ml 19.53 ml/m  RA Volume:   40.60 ml  17.28 ml/m LA Vol (A4C):   36.1 ml 15.36 ml/m LA Biplane Vol: 45.2 ml 19.23 ml/m  AORTIC VALVE LVOT Vmax:   113.00 cm/s LVOT Vmean:  80.800 cm/s LVOT VTI:    0.209 m  AORTA Ao Root diam: 3.20 cm Ao Asc diam:  4.10 cm MITRAL VALVE               TRICUSPID VALVE MV Area (PHT): 2.66 cm    TR Peak grad:   10.9 mmHg MV Decel Time: 285 msec    TR Vmax:        165.00 cm/s MV E velocity: 76.80 cm/s MV A velocity: 93.40 cm/s  SHUNTS MV E/A ratio:  0.82        Systemic  VTI:  0.21 m                            Systemic Diam: 2.10 cm Dalton McleanMD Electronically signed by Ezra Kanner Signature Date/Time: 06/25/2023/4:17:24 PM    Final (Updated)    CT Angio Chest PE W and/or Wo Contrast Result Date: 06/24/2023 CLINICAL DATA:  Pulmonary embolism (PE) suspected, high prob Left lung cancer. EXAM: CT ANGIOGRAPHY CHEST WITH CONTRAST TECHNIQUE: Multidetector CT imaging of the chest was performed using the standard protocol during bolus administration of intravenous contrast. Multiplanar CT image reconstructions and MIPs were obtained to evaluate the vascular anatomy. RADIATION DOSE REDUCTION: This exam was performed according to the departmental dose-optimization program which includes automated exposure control, adjustment of the mA and/or kV according to patient size and/or use of iterative reconstruction technique. CONTRAST:  60mL OMNIPAQUE  IOHEXOL  350 MG/ML SOLN COMPARISON:  Radiograph 05/05/2023, CT 03/26/2023 FINDINGS: Cardiovascular: Multilobar acute segmental pulmonary  emboli in the right lung. Thromboembolic burden is small to moderate. There is no right heart strain. The RV to LV ratio is 0.79. The heart is normal in size. Thoracic aorta is normal in caliber. No acute aortic findings. Common origin of brachiocephalic and left common carotid artery. No pericardial effusion. Mediastinum/Nodes: Postsurgical change at the left hilum. No enlarged mediastinal or hilar lymph nodes. Thyroid  nodules are less well-defined on the current exam given phase of contrast. Patulous esophagus without wall thickening. 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 The previous hydropneumothorax has improved. Small amount of pleural fluid persists including a small loculated components in the anterior upper lobe. No definite pleural thickening/enhancement. Clustered left lung nodule series 6, image 51, are unchanged  from prior exam. No evidence of pulmonary infarct or focal right lung opacity. No right pleural effusion. Upper Abdomen: No acute findings. Musculoskeletal: Diffuse thoracic spondylosis with anterior spurring. No evidence of focal bone lesion. Minimal bilateral gynecomastia, more so on the right. Review of the MIP images confirms the above findings. IMPRESSION: 1. Multilobar acute segmental pulmonary emboli in the right lung. Thromboembolic burden is small to moderate. No right heart strain. 2. Postsurgical change of left upper lobectomy. The previous hydropneumothorax has improved. Small amount of pleural fluid persists including a small loculated components in the anterior upper lobe. 3. Clustered left lung nodules are unchanged from prior exam. Recommend attention at follow-up. Critical Value/emergent results were called by telephone at the time of interpretation on 06/24/2023 at 8:35 pm to provider BROOKE SMALL , who verbally acknowledged these results. Electronically Signed   By: Andrea Gasman M.D.   On: 06/24/2023 20:37        Scheduled Meds:  amLODipine   10 mg Oral Daily   aspirin  EC  81 mg Oral Daily   atorvastatin   40 mg Oral Daily   dextrose   25 g Intravenous STAT   dextrose   50 mL Intravenous Once   enalapril   20 mg Oral BID   insulin  aspart  0-9 Units Subcutaneous TID WC   insulin  glargine-yfgn  16 Units Subcutaneous QHS   [START ON 06/27/2023] sodium chloride  flush  3 mL Intravenous Q12H   Continuous Infusions:  [START ON 06/27/2023] sodium chloride      sodium chloride      dextrose  100 mL/hr at 06/26/23 1419   heparin  1,700 Units/hr (06/26/23 1502)     LOS: 0 days    Time spent: 35 minutes.    Leatrice Chapel, MD  [facility] Pager #: [phone] 7PM-7AM contact night coverage as above

## 2023-06-26 NOTE — Progress Notes (Signed)
 PHARMACY - ANTICOAGULATION CONSULT NOTE  Pharmacy Consult for heparin  (2 hours post sheath removal) Indication: pulmonary embolus and DVT s/p thrombectomy  No Known Allergies  Patient Measurements: Height: 6' 3 (190.5 cm) Weight: 106.6 kg (235 lb) IBW/kg (Calculated) : 84.5 Heparin  Dosing Weight: 105 kg  Vital Signs: Temp: 100.6 F (38.1 C) (01/03 2037) Temp Source: Oral (01/03 2037) BP: 132/63 (01/03 2037) Pulse Rate: 82 (01/03 2037)  Labs: Recent Labs    06/24/23 1546 06/24/23 1852 06/24/23 2020 06/26/23 0506 06/26/23 2214  HGB 7.9*  --   --  7.4*  --   HCT 24.5*  --   --  22.7*  --   PLT 134*  --   --  165  --   HEPARINUNFRC  --   --   --   --  0.94*  CREATININE 1.81*  --   --  1.65*  --   TROPONINIHS  --  9 11  --   --     Estimated Creatinine Clearance: 55.8 mL/min (A) (by C-G formula based on SCr of 1.65 mg/dL (H)).   Medical History: Past Medical History:  Diagnosis Date   Diabetes mellitus    High cholesterol    Hypertension     Medications:  Medications Prior to Admission  Medication Sig Dispense Refill Last Dose/Taking   amLODipine  (NORVASC ) 10 MG tablet TAKE 1 TABLET BY MOUTH EVERY DAY 90 tablet 1 06/23/2023 Morning   aspirin  EC 81 MG tablet Take 81 mg by mouth daily. Swallow whole.   Past Month   atorvastatin  (LIPITOR) 40 MG tablet TAKE 1 TABLET BY MOUTH EVERY DAY 90 tablet 1 06/23/2023 Morning   cyanocobalamin  (VITAMIN B12) 1000 MCG/ML injection INJECT 1 ML (1,000 MCG TOTAL) INTO THE MUSCLE EVERY 30 DAYS. 9 mL 1 Past Month   dexamethasone  (DECADRON ) 4 MG tablet Take 1 tablet twice a day the day before, the day of, and the day after chemotherapy 40 tablet 2 Past Month   Dulaglutide  (TRULICITY ) 1.5 MG/0.5ML SOAJ Inject 1.5 mg into the skin every Sunday.   Past Week   enalapril  (VASOTEC ) 20 MG tablet TAKE 1 TABLET TWICE DAILY. 90 tablet 1 06/23/2023 Bedtime   folic acid  (FOLVITE ) 1 MG tablet Take 1 tablet (1 mg total) by mouth daily. 30 tablet 2  06/23/2023 Morning   insulin  glargine (LANTUS ) 100 UNIT/ML injection Inject 21 Units into the skin at bedtime.   06/23/2023 Bedtime   oxyCODONE  (OXY IR/ROXICODONE ) 5 MG immediate release tablet Take 1 tablet (5 mg total) by mouth every 6 (six) hours as needed for moderate pain. 28 tablet 0 Taking As Needed   sildenafil  (VIAGRA ) 100 MG tablet TAKE 1 TABLET BY MOUTH EVERY DAY AS NEEDED (INSURANCE COVERS 10 TAB PER 77DAYS) 10 tablet 5 Past Month   chlorproMAZINE  (THORAZINE ) 25 MG tablet Take 25 mg by mouth 3 (three) times daily as needed for hiccoughs. (Patient not taking: Reported on 06/24/2023)   Not Taking   glucose blood (ONETOUCH VERIO) test strip Use as instructed to check blood sugars twice daily E11.69 100 each 2    glucose blood test strip Use as instructed to test blood sugar 3 times a day. Dx code: e11.65 100 each 2    ondansetron  (ZOFRAN ) 8 MG tablet Take 1 tablet (8 mg total) by mouth every 8 (eight) hours as needed for nausea or vomiting. Starting day 3 after chemotherapy (Patient not taking: Reported on 06/24/2023) 30 tablet 2 Not Taking   prochlorperazine  (COMPAZINE ) 10  MG tablet Take 1 tablet (10 mg total) by mouth every 6 (six) hours as needed. (Patient not taking: Reported on 06/24/2023) 270 tablet 1 Not Taking   SYRINGE-NEEDLE, DISP, 3 ML (B-D INTE[employer]RA SYRINGE) 25G X 5/8 3 ML MISC USE AS DIRECTED 12 each 24     Assessment: 68 yo M with small PE and L DVT. Pt undergoing chemotherapy. Now s/p thrombectomy. Pharmacy consulted to start heparin  2 hours post sheath removal. Last dose of Lovenox  on 1/2 @ 2108. Sheath removal today @ 1235.   2nd: Heparin  level supratherapeutic at 0.94.   Goal of Therapy:  Heparin  level 0.3-0.7 units/ml Monitor platelets by anticoagulation protocol: Yes   Plan:  Reduce heparin  to 1500 units/hr  Check anti-Xa level in 6 hours and daily while on heparin  Continue to monitor H&H and platelets Per vascular note: Okay to start DOAC tomorrow if access site is  hemostatic.  Patient needs aspirin  and DOAC for at least 6 months.  William Mcguire, PharmD, BCPS, BCCCP Clinical Pharmacist

## 2023-06-26 NOTE — Progress Notes (Addendum)
  Progress Note    06/26/2023 7:29 AM Hospital Day 2  Subjective:  sitting up in bed.  No complaints   Tm 99.2 now afebrile  Vitals:   06/26/23 0319 06/26/23 0714  BP: 106/68 118/66  Pulse: 74   Resp: 20 18  Temp: 98.6 F (37 C) 98.5 F (36.9 C)  SpO2: 100%     Physical Exam: General:  no distress Lungs:  non labored  CBC    Component Value Date/Time   WBC 3.5 (L) 06/26/2023 0506   RBC 2.67 (L) 06/26/2023 0506   HGB 7.4 (L) 06/26/2023 0506   HGB 8.2 (L) 06/22/2023 1142   HGB 12.1 (L) 03/31/2022 1659   HCT 22.7 (L) 06/26/2023 0506   HCT 36.0 (L) 03/31/2022 1659   PLT 165 06/26/2023 0506   PLT 99 (L) 06/22/2023 1142   PLT 236 03/31/2022 1659   MCV 85.0 06/26/2023 0506   MCV 85 03/31/2022 1659   MCH 27.7 06/26/2023 0506   MCHC 32.6 06/26/2023 0506   RDW 16.3 (H) 06/26/2023 0506   RDW 13.2 03/31/2022 1659   LYMPHSABS 1.6 06/26/2023 0506   MONOABS 0.8 06/26/2023 0506   EOSABS 0.1 06/26/2023 0506   BASOSABS 0.0 06/26/2023 0506    BMET    Component Value Date/Time   NA 132 (L) 06/26/2023 0506   NA 137 11/05/2022 1152   K 4.5 06/26/2023 0506   CL 104 06/26/2023 0506   CO2 22 06/26/2023 0506   GLUCOSE 75 06/26/2023 0506   BUN 18 06/26/2023 0506   BUN 11 11/05/2022 1152   CREATININE 1.65 (H) 06/26/2023 0506   CREATININE 1.50 (H) 06/22/2023 1142   CALCIUM  8.8 (L) 06/26/2023 0506   GFRNONAA 45 (L) 06/26/2023 0506   GFRNONAA 50 (L) 06/22/2023 1142   GFRAA 70 08/06/2020 1044    INR    Component Value Date/Time   INR 1.1 03/16/2023 1328     Intake/Output Summary (Last 24 hours) at 06/26/2023 0729 Last data filed at 06/25/2023 1509 Gross per 24 hour  Intake --  Output 900 ml  Net -900 ml     Assessment/Plan:  68 y.o. male with LLE DVT  Hospital Day 2  -plan for LLE venous thrombectomy later this morning.  -discussed with wife that Dr. Magda wound talk with her after the procedure.   -continue npo -discussed with Dr. Magda and will hold Lovenox   this am.   Lucie Apt, PA-C [facility] [phone] 06/26/2023 7:29 AM

## 2023-06-26 NOTE — Plan of Care (Signed)
  Problem: Education: Goal: Ability to describe self-care measures that may prevent or decrease complications (Diabetes Survival Skills Education) will improve Outcome: Progressing Goal: Individualized Educational Video(s) Outcome: Progressing   Problem: Coping: Goal: Ability to adjust to condition or change in health will improve Outcome: Progressing   Problem: Health Behavior/Discharge Planning: Goal: Ability to identify and utilize available resources and services will improve Outcome: Progressing Goal: Ability to manage health-related needs will improve Outcome: Progressing   Problem: Fluid Volume: Goal: Ability to maintain a balanced intake and output will improve Outcome: Progressing   Problem: Metabolic: Goal: Ability to maintain appropriate glucose levels will improve Outcome: Progressing   Problem: Nutritional: Goal: Maintenance of adequate nutrition will improve Outcome: Progressing   Problem: Nutritional: Goal: Maintenance of adequate nutrition will improve Outcome: Progressing Goal: Progress toward achieving an optimal weight will improve Outcome: Progressing   Problem: Skin Integrity: Goal: Risk for impaired skin integrity will decrease Outcome: Progressing   Problem: Tissue Perfusion: Goal: Adequacy of tissue perfusion will improve Outcome: Progressing   Problem: Education: Goal: Knowledge of General Education information will improve Description: Including pain rating scale, medication(s)/side effects and non-pharmacologic comfort measures Outcome: Progressing   Problem: Health Behavior/Discharge Planning: Goal: Ability to manage health-related needs will improve Outcome: Progressing   Problem: Clinical Measurements: Goal: Ability to maintain clinical measurements within normal limits will improve Outcome: Progressing Goal: Will remain free from infection Outcome: Progressing Goal: Diagnostic test results will improve Outcome: Progressing Goal:  Respiratory complications will improve Outcome: Progressing Goal: Cardiovascular complication will be avoided Outcome: Progressing   Problem: Activity: Goal: Risk for activity intolerance will decrease Outcome: Progressing   Problem: Nutrition: Goal: Adequate nutrition will be maintained Outcome: Progressing   Problem: Coping: Goal: Level of anxiety will decrease Outcome: Progressing   Problem: Elimination: Goal: Will not experience complications related to bowel motility Outcome: Progressing Goal: Will not experience complications related to urinary retention Outcome: Progressing   Problem: Pain Management: Goal: General experience of comfort will improve Outcome: Progressing   Problem: Safety: Goal: Ability to remain free from injury will improve Outcome: Progressing   Problem: Skin Integrity: Goal: Risk for impaired skin integrity will decrease Outcome: Progressing   Problem: Health Behavior/Discharge Planning: Goal: Ability to manage health-related needs will improve Outcome: Progressing   Problem: Clinical Measurements: Goal: Ability to maintain clinical measurements within normal limits will improve Outcome: Progressing Goal: Will remain free from infection Outcome: Progressing Goal: Diagnostic test results will improve Outcome: Progressing Goal: Respiratory complications will improve Outcome: Progressing Goal: Cardiovascular complication will be avoided Outcome: Progressing

## 2023-06-26 NOTE — Op Note (Addendum)
 DATE OF SERVICE: 06/26/2023  PATIENT:  William Mcguire  68 y.o. 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 5) left common iliac vein angioplasty stenting (18 x 100 mm Abre) 6) Conscious sedation (50 minutes)  CPT: 23062, 37252, 37253 x 4, 75820, 75825, 37184, 37185 x 4, 37238, 99152, 00846 x 3  SURGEON:  Debby SAILOR. Magda, MD  ASSISTANT: none  ANESTHESIA:   local and IV sedation  ESTIMATED BLOOD LOSS: minimal  LOCAL MEDICATIONS USED:  LIDOCAINE    COUNTS: confirmed correct.  PATIENT DISPOSITION:  PACU - hemodynamically stable.   Delay start of Pharmacological VTE agent (>24hrs) due to surgical blood loss or risk of bleeding: no  INDICATION FOR PROCEDURE: William Mcguire is a 68 y.o. male with extensive left lower extremity deep venous thrombosis. After careful discussion of risks, benefits, and alternatives the patient was offered left lower extremity thrombectomy. The patient understood and wished to proceed.  OPERATIVE FINDINGS:  Occlusive thrombus of the left lower extremity from the popliteal vein to the external iliac vein, as suggested by preoperative duplex.  Good technical result from thrombectomy.  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  After thrombectomy, nearly all thrombus was cleared.  Improved flow was noted through the  left lower extremity.  The stenosis persisted, and so I elected to stent this.  Good technical result from angioplasty and stenting on both IVUS and venogram.  DESCRIPTION OF PROCEDURE: After identification of the patient in the pre-operative holding area, the patient was transferred to the operating room. The patient was positioned supine on the operating room table. Anesthesia was induced. The groins was prepped and draped in standard fashion. A surgical pause was performed confirming correct patient, procedure, and operative location.  The skin overlaying the left popliteal vein was anesthetized with 1% lidocaine .  Micropuncture access was obtained in the left popliteal vein.  Using Seldinger technique, the micro sheath was introduced into the vein.  A Glidewire advantage was navigated into the inferior vena cava without difficulty.  Access was upsized to 8 French.  Intravascular ultrasound was performed of the inferior vena cava, left common iliac vein, left external iliac vein, left common femoral vein, left femoral vein.  Ascending venogram was performed.  Both showed occlusive thrombus in the leg.  Patient was systemically heparinized.    Access was upsized to a 16 French dry seal.  A CAT 16 penumbra thrombectomy device was used to perform thrombectomy.  Good technical result was achieved.  Nearly all thrombus was removed.  Repeat venogram and ultrasound was performed.  Good technical result was achieved from thrombectomy.  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  I used intravascular ultrasound to measure the diameter of the healthy appearing common iliac vein.  This measured 16 mm.  I selected an 18 mm x 100 mm Abre stent and deployed this across the lesion overhang into the inferior vena cava.  I  angioplastied this with a 16 mm Atlas balloon.  Repeat venogram and intravascular ultrasound was performed showing good technical result from  intervention.  All endovascular equipment was removed.  A U-stitch was applied to the access site with a bolster.  Hemostasis was achieved.  The left leg was wrapped  Conscious sedation was administered with the use of IV fentanyl  and midazolam  under continuous physician and nurse monitoring.  Heart rate, blood pressure, and oxygen saturation were continuously monitored.  Total sedation time was 50 minutes  Upon completion of the case instrument and sharps counts were confirmed correct. The patient was transferred to the PACU in good condition. I was present for all portions of the procedure.  PLAN: Start heparin  infusion.  Okay to start DOAC tomorrow if access site is hemostatic.  Patient needs aspirin  and DOAC for at least 6 months.  Follow-up with me in 4-6 weeks.   Debby SAILOR. Magda, MD [facility] Phone Number: [phone] 06/26/2023 1:33 PM

## 2023-06-26 NOTE — Progress Notes (Signed)
 PHARMACY - ANTICOAGULATION CONSULT NOTE  Pharmacy Consult for heparin  (2 hours post sheath removal) Indication: pulmonary embolus and DVT s/p thrombectomy  No Known Allergies  Patient Measurements: Height: 6' 3 (190.5 cm) Weight: 106.6 kg (235 lb) IBW/kg (Calculated) : 84.5 Heparin  Dosing Weight: 105 kg  Vital Signs: Temp: 98.5 F (36.9 C) (01/03 1307) Temp Source: Oral (01/03 1307) BP: 117/64 (01/03 1307) Pulse Rate: 69 (01/03 1307)  Labs: Recent Labs    06/24/23 1546 06/24/23 1852 06/24/23 2020 06/26/23 0506  HGB 7.9*  --   --  7.4*  HCT 24.5*  --   --  22.7*  PLT 134*  --   --  165  CREATININE 1.81*  --   --  1.65*  TROPONINIHS  --  9 11  --     Estimated Creatinine Clearance: 55.8 mL/min (A) (by C-G formula based on SCr of 1.65 mg/dL (H)).   Medical History: Past Medical History:  Diagnosis Date   Diabetes mellitus    High cholesterol    Hypertension     Medications:  Medications Prior to Admission  Medication Sig Dispense Refill Last Dose/Taking   amLODipine  (NORVASC ) 10 MG tablet TAKE 1 TABLET BY MOUTH EVERY DAY 90 tablet 1 06/23/2023 Morning   aspirin  EC 81 MG tablet Take 81 mg by mouth daily. Swallow whole.   Past Month   atorvastatin  (LIPITOR) 40 MG tablet TAKE 1 TABLET BY MOUTH EVERY DAY 90 tablet 1 06/23/2023 Morning   cyanocobalamin  (VITAMIN B12) 1000 MCG/ML injection INJECT 1 ML (1,000 MCG TOTAL) INTO THE MUSCLE EVERY 30 DAYS. 9 mL 1 Past Month   dexamethasone  (DECADRON ) 4 MG tablet Take 1 tablet twice a day the day before, the day of, and the day after chemotherapy 40 tablet 2 Past Month   Dulaglutide  (TRULICITY ) 1.5 MG/0.5ML SOAJ Inject 1.5 mg into the skin every Sunday.   Past Week   enalapril  (VASOTEC ) 20 MG tablet TAKE 1 TABLET TWICE DAILY. 90 tablet 1 06/23/2023 Bedtime   folic acid  (FOLVITE ) 1 MG tablet Take 1 tablet (1 mg total) by mouth daily. 30 tablet 2 06/23/2023 Morning   insulin  glargine (LANTUS ) 100 UNIT/ML injection Inject 21 Units  into the skin at bedtime.   06/23/2023 Bedtime   oxyCODONE  (OXY IR/ROXICODONE ) 5 MG immediate release tablet Take 1 tablet (5 mg total) by mouth every 6 (six) hours as needed for moderate pain. 28 tablet 0 Taking As Needed   sildenafil  (VIAGRA ) 100 MG tablet TAKE 1 TABLET BY MOUTH EVERY DAY AS NEEDED (INSURANCE COVERS 10 TAB PER 77DAYS) 10 tablet 5 Past Month   chlorproMAZINE  (THORAZINE ) 25 MG tablet Take 25 mg by mouth 3 (three) times daily as needed for hiccoughs. (Patient not taking: Reported on 06/24/2023)   Not Taking   glucose blood (ONETOUCH VERIO) test strip Use as instructed to check blood sugars twice daily E11.69 100 each 2    glucose blood test strip Use as instructed to test blood sugar 3 times a day. Dx code: e11.65 100 each 2    ondansetron  (ZOFRAN ) 8 MG tablet Take 1 tablet (8 mg total) by mouth every 8 (eight) hours as needed for nausea or vomiting. Starting day 3 after chemotherapy (Patient not taking: Reported on 06/24/2023) 30 tablet 2 Not Taking   prochlorperazine  (COMPAZINE ) 10 MG tablet Take 1 tablet (10 mg total) by mouth every 6 (six) hours as needed. (Patient not taking: Reported on 06/24/2023) 270 tablet 1 Not Taking   SYRINGE-NEEDLE, DISP, 3  ML (B-D INTEGRA SYRINGE) 25G X 5/8 3 ML MISC USE AS DIRECTED 12 each 24     Assessment: 68 yo M with small PE and L DVT. Pt undergoing chemotherapy. Now s/p thrombectomy. Pharmacy consulted to start heparin  2 hours post sheath removal. Last dose of Lovenox  on 1/2 @ 2108. Sheath removal today @ 1235.   Goal of Therapy:  Heparin  level 0.3-0.7 units/ml Monitor platelets by anticoagulation protocol: Yes   Plan:  Start heparin  infusion at 1700 units/hr Check anti-Xa level in 6-8 hours and daily while on heparin  Continue to monitor H&H and platelets Per vascular note: Okay to start DOAC tomorrow if access site is hemostatic.  Patient needs aspirin  and DOAC for at least 6 months.   Thank you for allowing pharmacy to be a part of this  patient's care.   Bascom JAYSON Louder, PharmD 06/26/2023 2:37 PM  **Pharmacist phone directory can be found on [email or web address] listed under [facility]**

## 2023-06-27 DIAGNOSIS — I2699 Other pulmonary embolism without acute cor pulmonale: Secondary | ICD-10-CM | POA: Diagnosis not present

## 2023-06-27 LAB — CBC
HCT: 19.6 % — ABNORMAL LOW (ref 39.0–52.0)
HCT: 21.7 % — ABNORMAL LOW (ref 39.0–52.0)
Hemoglobin: 6.4 g/dL — CL (ref 13.0–17.0)
Hemoglobin: 7.2 g/dL — ABNORMAL LOW (ref 13.0–17.0)
MCH: 27.6 pg (ref 26.0–34.0)
MCH: 28.1 pg (ref 26.0–34.0)
MCHC: 32.7 g/dL (ref 30.0–36.0)
MCHC: 33.2 g/dL (ref 30.0–36.0)
MCV: 84.5 fL (ref 80.0–100.0)
MCV: 84.8 fL (ref 80.0–100.0)
Platelets: 180 10*3/uL (ref 150–400)
Platelets: 195 10*3/uL (ref 150–400)
RBC: 2.32 MIL/uL — ABNORMAL LOW (ref 4.22–5.81)
RBC: 2.56 MIL/uL — ABNORMAL LOW (ref 4.22–5.81)
RDW: 16 % — ABNORMAL HIGH (ref 11.5–15.5)
RDW: 15.9 % — ABNORMAL HIGH (ref 11.5–15.5)
WBC: 4 10*3/uL (ref 4.0–10.5)
WBC: 4.5 10*3/uL (ref 4.0–10.5)
nRBC: 0 % (ref 0.0–0.2)
nRBC: 0 % (ref 0.0–0.2)

## 2023-06-27 LAB — HEPARIN LEVEL (UNFRACTIONATED)
Heparin Unfractionated: 0.92 [IU]/mL — ABNORMAL HIGH (ref 0.30–0.70)
Heparin Unfractionated: 0.77 [IU]/mL — ABNORMAL HIGH (ref 0.30–0.70)

## 2023-06-27 LAB — PREPARE RBC (CROSSMATCH)

## 2023-06-27 LAB — GLUCOSE, CAPILLARY
Glucose-Capillary: 147 mg/dL — ABNORMAL HIGH (ref 70–99)
Glucose-Capillary: 206 mg/dL — ABNORMAL HIGH (ref 70–99)
Glucose-Capillary: 169 mg/dL — ABNORMAL HIGH (ref 70–99)
Glucose-Capillary: 121 mg/dL — ABNORMAL HIGH (ref 70–99)
Glucose-Capillary: 132 mg/dL — ABNORMAL HIGH (ref 70–99)
Glucose-Capillary: 192 mg/dL — ABNORMAL HIGH (ref 70–99)

## 2023-06-27 MED ORDER — SODIUM CHLORIDE 0.9% IV SOLUTION: Freq: Once | INTRAVENOUS | Status: AC

## 2023-06-27 MED ORDER — MAGNESIUM HYDROXIDE 400 MG/5ML PO SUSP
30.0000 mL | Freq: Every day | ORAL | Status: DC | PRN
  Administered 2023-06-27: 30 mL via ORAL
  Filled 2023-06-27: qty 30

## 2023-06-27 NOTE — Progress Notes (Signed)
 PHARMACY - ANTICOAGULATION CONSULT NOTE  Pharmacy Consult for heparin  (2 hours post sheath removal) Indication: pulmonary embolus and DVT s/p thrombectomy  No Known Allergies  Patient Measurements: Height: 6' 3 (190.5 cm) Weight: 106.6 kg (235 lb) IBW/kg (Calculated) : 84.5 Heparin  Dosing Weight: 105 kg  Vital Signs: Temp: 98.4 F (36.9 C) (01/04 1215) Temp Source: Oral (01/04 1215) BP: 130/68 (01/04 1215) Pulse Rate: 76 (01/04 1215)  Labs: Recent Labs    06/24/23 1852 06/24/23 2020 06/26/23 0506 06/26/23 0506 06/26/23 2214 06/27/23 0538 06/27/23 0751 06/27/23 1559  HGB  --   --  7.4*   < >  --  6.4* 7.2*  --   HCT  --   --  22.7*  --   --  19.6* 21.7*  --   PLT  --   --  165  --   --  180 195  --   HEPARINUNFRC  --   --   --   --  0.94* 0.92*  --  0.77*  CREATININE  --   --  1.65*  --   --   --   --   --   TROPONINIHS 9 11  --   --   --   --   --   --    < > = values in this interval not displayed.    Estimated Creatinine Clearance: 55.8 mL/min (A) (by C-G formula based on SCr of 1.65 mg/dL (H)).   Medical History: Past Medical History:  Diagnosis Date   Diabetes mellitus    High cholesterol    Hypertension     Assessment: 68 yo M with small PE and L DVT. Pt undergoing chemotherapy. Now s/p thrombectomy. Pharmacy consulted to start heparin  2 hours post sheath removal. Last dose of Lovenox  on 1/2 @ 2108. Sheath removal today @ 1235.   1/4 AM: Heparin  level remains supratherapeutic at 0.92 despite decreasing the rate to 1500 units/hr. Hgb this morning was 6.4, repeated and it was 7.2. Patient to receive 1 unit PRBC today. PLT stable at 195. No issues with infusion and no bleeding per RN. MD aware of drop in Hgb and is okay with continuing heparin  for now.  1/4 PM: heparin  level remains supratherapeutic at 0.77, but improved after rate decreased to 1300 units/hr. No visible bleeding reported per RN.  Goal of Therapy:  Heparin  level 0.3-0.7 units/ml Monitor  platelets by anticoagulation protocol: Yes   Plan:  Reduce heparin  to 1200 units/hr  Check anti-Xa level in 8 hours and daily while on heparin  Continue to monitor H&H and platelets Per vascular note: Okay to start DOAC if access site is hemostatic. Patient needs aspirin  and DOAC for at least 6 months. Follow-up transition to DOAC.  Rocky Slade, PharmD, BCPS 06/27/2023 4:29 PM  Please check AMION for all [facility] phone numbers After 10:00 PM, call Main Pharmacy [phone]

## 2023-06-27 NOTE — Progress Notes (Signed)
 PHARMACY - ANTICOAGULATION CONSULT NOTE  Pharmacy Consult for heparin  (2 hours post sheath removal) Indication: pulmonary embolus and DVT s/p thrombectomy  No Known Allergies  Patient Measurements: Height: 6' 3 (190.5 cm) Weight: 106.6 kg (235 lb) IBW/kg (Calculated) : 84.5 Heparin  Dosing Weight: 105 kg  Vital Signs: Temp: 99.2 F (37.3 C) (01/04 0727) Temp Source: Oral (01/04 0727) BP: 125/66 (01/04 0727) Pulse Rate: 77 (01/04 0416)  Labs: Recent Labs    06/24/23 1546 06/24/23 1852 06/24/23 2020 06/26/23 0506 06/26/23 2214 06/27/23 0538 06/27/23 0751  HGB 7.9*  --   --  7.4*  --  6.4* 7.2*  HCT 24.5*  --   --  22.7*  --  19.6* 21.7*  PLT 134*  --   --  165  --  180 195  HEPARINUNFRC  --   --   --   --  0.94* 0.92*  --   CREATININE 1.81*  --   --  1.65*  --   --   --   TROPONINIHS  --  9 11  --   --   --   --     Estimated Creatinine Clearance: 55.8 mL/min (A) (by C-G formula based on SCr of 1.65 mg/dL (H)).   Medical History: Past Medical History:  Diagnosis Date   Diabetes mellitus    High cholesterol    Hypertension     Assessment: 68 yo M with small PE and L DVT. Pt undergoing chemotherapy. Now s/p thrombectomy. Pharmacy consulted to start heparin  2 hours post sheath removal. Last dose of Lovenox  on 1/2 @ 2108. Sheath removal today @ 1235.   1/4 AM: Heparin  level remains supratherapeutic at 0.92 despite decreasing the rate to 1500 units/hr. Hgb this morning was 6.4, repeated and it was 7.2. Patient to receive 1 unit PRBC today. PLT stable at 195. No issues with infusion and no bleeding per RN. MD aware of drop in Hgb and is okay with continuing heparin  for now.  Goal of Therapy:  Heparin  level 0.3-0.7 units/ml Monitor platelets by anticoagulation protocol: Yes   Plan:  Reduce heparin  to 1300 units/hr  Check anti-Xa level in 6 hours and daily while on heparin  Continue to monitor H&H and platelets Per vascular note: Okay to start DOAC if access site  is hemostatic. Patient needs aspirin  and DOAC for at least 6 months. Follow-up transition to DOAC.  Josefa Range, PharmD PGY1 Pharmacy Resident 06/27/2023 9:07 AM

## 2023-06-27 NOTE — Plan of Care (Signed)
  Problem: Education: Goal: Ability to describe self-care measures that may prevent or decrease complications (Diabetes Survival Skills Education) will improve Outcome: Progressing Goal: Individualized Educational Video(s) Outcome: Progressing   Problem: Coping: Goal: Ability to adjust to condition or change in health will improve Outcome: Progressing   Problem: Fluid Volume: Goal: Ability to maintain a balanced intake and output will improve Outcome: Progressing   Problem: Health Behavior/Discharge Planning: Goal: Ability to identify and utilize available resources and services will improve Outcome: Progressing Goal: Ability to manage health-related needs will improve Outcome: Progressing   Problem: Nutritional: Goal: Maintenance of adequate nutrition will improve Outcome: Progressing Goal: Progress toward achieving an optimal weight will improve Outcome: Progressing   Problem: Skin Integrity: Goal: Risk for impaired skin integrity will decrease Outcome: Progressing   Problem: Tissue Perfusion: Goal: Adequacy of tissue perfusion will improve Outcome: Progressing   Problem: Health Behavior/Discharge Planning: Goal: Ability to manage health-related needs will improve Outcome: Progressing   Problem: Clinical Measurements: Goal: Ability to maintain clinical measurements within normal limits will improve Outcome: Progressing Goal: Will remain free from infection Outcome: Progressing Goal: Diagnostic test results will improve Outcome: Progressing Goal: Respiratory complications will improve Outcome: Progressing Goal: Cardiovascular complication will be avoided Outcome: Progressing   Problem: Activity: Goal: Risk for activity intolerance will decrease Outcome: Progressing   Problem: Nutrition: Goal: Adequate nutrition will be maintained Outcome: Progressing   Problem: Coping: Goal: Level of anxiety will decrease Outcome: Progressing   Problem:  Elimination: Goal: Will not experience complications related to bowel motility Outcome: Progressing Goal: Will not experience complications related to urinary retention Outcome: Progressing   Problem: Safety: Goal: Ability to remain free from injury will improve Outcome: Progressing   Problem: Skin Integrity: Goal: Risk for impaired skin integrity will decrease Outcome: Progressing   Problem: Education: Goal: Knowledge of General Education information will improve Description: Including pain rating scale, medication(s)/side effects and non-pharmacologic comfort measures Outcome: Progressing   Problem: Health Behavior/Discharge Planning: Goal: Ability to manage health-related needs will improve Outcome: Progressing   Problem: Clinical Measurements: Goal: Ability to maintain clinical measurements within normal limits will improve Outcome: Progressing Goal: Will remain free from infection Outcome: Progressing Goal: Diagnostic test results will improve Outcome: Progressing Goal: Respiratory complications will improve Outcome: Progressing Goal: Cardiovascular complication will be avoided Outcome: Progressing   Problem: Activity: Goal: Risk for activity intolerance will decrease Outcome: Progressing   Problem: Nutrition: Goal: Adequate nutrition will be maintained Outcome: Progressing   Problem: Coping: Goal: Level of anxiety will decrease Outcome: Progressing   Problem: Elimination: Goal: Will not experience complications related to bowel motility Outcome: Progressing Goal: Will not experience complications related to urinary retention Outcome: Progressing   Problem: Pain Management: Goal: General experience of comfort will improve Outcome: Progressing   Problem: Skin Integrity: Goal: Risk for impaired skin integrity will decrease Outcome: Progressing   Problem: Education: Goal: Understanding of CV disease, CV risk reduction, and recovery process will  improve Outcome: Progressing Goal: Individualized Educational Video(s) Outcome: Progressing   Problem: Activity: Goal: Ability to return to baseline activity level will improve Outcome: Progressing   Problem: Cardiovascular: Goal: Ability to achieve and maintain adequate cardiovascular perfusion will improve Outcome: Progressing Goal: Vascular access site(s) Level 0-1 will be maintained Outcome: Progressing

## 2023-06-27 NOTE — Progress Notes (Signed)
  Progress Note    06/27/2023 7:34 AM Hospital Day 2  Subjective:  sitting up in bed eating breakfast   Vitals:   06/27/23 0416 06/27/23 0727  BP: 114/68 125/66  Pulse: 77   Resp: 16 16  Temp: 99.8 F (37.7 C) 99.2 F (37.3 C)  SpO2: 100%     Physical Exam: General:  no distress Lungs:  non labored Extremities: LLE soft, coban wrap in place, palpable DP  CBC    Component Value Date/Time   WBC 4.0 06/27/2023 0538   RBC 2.32 (L) 06/27/2023 0538   HGB 6.4 (LL) 06/27/2023 0538   HGB 8.2 (L) 06/22/2023 1142   HGB 12.1 (L) 03/31/2022 1659   HCT 19.6 (L) 06/27/2023 0538   HCT 36.0 (L) 03/31/2022 1659   PLT 180 06/27/2023 0538   PLT 99 (L) 06/22/2023 1142   PLT 236 03/31/2022 1659   MCV 84.5 06/27/2023 0538   MCV 85 03/31/2022 1659   MCH 27.6 06/27/2023 0538   MCHC 32.7 06/27/2023 0538   RDW 16.0 (H) 06/27/2023 0538   RDW 13.2 03/31/2022 1659   LYMPHSABS 1.6 06/26/2023 0506   MONOABS 0.8 06/26/2023 0506   EOSABS 0.1 06/26/2023 0506   BASOSABS 0.0 06/26/2023 0506    BMET    Component Value Date/Time   NA 132 (L) 06/26/2023 0506   NA 137 11/05/2022 1152   K 4.5 06/26/2023 0506   CL 104 06/26/2023 0506   CO2 22 06/26/2023 0506   GLUCOSE 99 06/26/2023 1328   BUN 18 06/26/2023 0506   BUN 11 11/05/2022 1152   CREATININE 1.65 (H) 06/26/2023 0506   CREATININE 1.50 (H) 06/22/2023 1142   CALCIUM  8.8 (L) 06/26/2023 0506   GFRNONAA 45 (L) 06/26/2023 0506   GFRNONAA 50 (L) 06/22/2023 1142   GFRAA 70 08/06/2020 1044    INR    Component Value Date/Time   INR 1.1 03/16/2023 1328     Intake/Output Summary (Last 24 hours) at 06/27/2023 0734 Last data filed at 06/27/2023 0732 Gross per 24 hour  Intake 3042.21 ml  Output 1260 ml  Net 1782.21 ml     Assessment/Plan:  68 y.o. male 1 day s/p LLE thrombectomy with iliac vein stenting  Recovering well, reports left leg feels better. Will remove suture from left popliteal region later this morning Okay to transition  to DOAC and begin discharge planning   Norman GORMAN Serve MD [facility] Phone Number: [phone] 06/27/2023 7:40 AM

## 2023-06-27 NOTE — Progress Notes (Signed)
 PROGRESS NOTE    Milano Rosevear  FMW:[id number] DOB: 1955-07-20 DOA: 06/24/2023 PCP: Jarold Medici, MD  Outpatient Specialists:     Brief Narrative:  Patient is a 68 year old male with past medical history significant for adenocarcinoma of left lung (stage II), status post lobectomy and currently on chemotherapy; diabetes mellitus type 2, hypertension, hyperlipidemia and chronic kidney disease stage III.  Patient has never smoked cigarettes.  Patient was admitted with extensive DVT of left lower extremity and acute pulmonary embolism.  Patient has been on subcutaneous Lovenox .  Vascular surgery team was consulted for possible mechanical thrombectomy.  06/26/2023: Patient seen alongside patient's wife.  Patient is awaiting mechanical thrombectomy by the vascular surgery team.  No new complaints.  06/27/2023: Patient underwent mechanical thrombectomy of left lower extremity with iliac vein stenting yesterday.  Hemoglobin dropped to 7.2 g/dL today.  No visual bleeding.  Patient has been on IV fluid 100 cc/h.  Will transfuse 1 unit of packed red blood cells.  Will continue heparin  for now.  Likely transition to DOAC tomorrow.   Assessment & Plan:   Principal Problem:   Acute pulmonary embolism without acute cor pulmonale (HCC) Active Problems:   T2DM (type 2 diabetes mellitus) (HCC)   HTN (hypertension)   Adenocarcinoma of left lung (HCC)   CKD stage 3a, GFR 45-59 ml/min (HCC)   Acute deep vein thrombosis (DVT) of femoral vein of left lower extremity (HCC)   Multiple subsegmental pulmonary emboli without acute cor pulmonale (HCC)   Acute pulmonary embolism (HCC)   Acute pulmonary embolism without acute cor pulmonale (HCC)/extensive lower extremity DVT: -Patient is currently on heparin  drip. -Hemoglobin of 7.2 g/dL noted today.  Patient is currently being transfused 1 unit of packed red blood cells. -No obvious bleeding. -Check CBC tomorrow. -Patient has undergone thrombectomy of left  lower extremity and iliac vein stenting. -Vascular surgery input is appreciated.   -Input from the hematology/oncology team is appreciated. -Patient is known to Dr. Gatha Gatha, local oncologist.  Patient will follow-up with Dr. Gatha on discharge. -Patient is status longer lobectomy for stage II lung CVA and currently on chemotherapy (NSCLC adenocarcinoma).     Adenocarcinoma of left lung (HCC) On adjuvant chemo, they had throught they had gotten all of the tumor with lobectomy in Sept. No definite evidence to suggest recurrence on CTA chest: has some focus of nodularity in lung but this is unchanged compared to prior imaging. Still, unprovoked PE does raise concern / question.   CKD stage 3a, GFR 45-59 ml/min (HCC) -Stable.  HTN (hypertension) Cont amlodipine , enalapril    T2DM (type 2 diabetes mellitus) (HCC) Cont Lantus  21u at bedtime Sensitive SSI AC for the moment Has been holding trulicity  for procedure (supposed to have screening colonoscopy tomorrow), will leave this on hold for the moment 06/26/2023: Hypoglycemia noted today.  We managed with D50 and D10.  Monitor blood sugar closely.   DVT prophylaxis: Subcutaneous Lovenox . Code Status: Full code Family Communication: Wife Disposition Plan: Eventually   Consultants:  Vascular surgery Hematology/oncology  Procedures:  Mechanical thrombectomy is planned.  Antimicrobials:  None.   Subjective: No shortness of breath. No chest pain.  Objective: Vitals:   06/27/23 0727 06/27/23 0928 06/27/23 0953 06/27/23 1215  BP: 125/66 112/64  130/68  Pulse:  76 77 76  Resp: 16 17 17 17   Temp: 99.2 F (37.3 C) 99.2 F (37.3 C) 99.5 F (37.5 C) 98.4 F (36.9 C)  TempSrc: Oral Oral Oral Oral  SpO2:  100%  Weight:      Height:        Intake/Output Summary (Last 24 hours) at 06/27/2023 1622 Last data filed at 06/27/2023 1215 Gross per 24 hour  Intake 2547.88 ml  Output 1260 ml  Net 1287.88 ml   Filed Weights    06/25/23 1410  Weight: 106.6 kg    Examination:  General exam: Appears calm and comfortable  Respiratory system: Clear to auscultation.  Cardiovascular system: S1 & S2 heard. Gastrointestinal system: Abdomen is soft and nontender. Central nervous system: Awake and alert.  Moves all extremities.   Extremities: Left lower extremity is wrapped.    Data Reviewed: I have personally reviewed following labs and imaging studies  CBC: Recent Labs  Lab 06/22/23 1142 06/24/23 1546 06/26/23 0506 06/27/23 0538 06/27/23 0751  WBC 3.8* 2.9* 3.5* 4.0 4.5  NEUTROABS 2.2  --  0.9*  --   --   HGB 8.2* 7.9* 7.4* 6.4* 7.2*  HCT 24.9* 24.5* 22.7* 19.6* 21.7*  MCV 85.3 86.0 85.0 84.5 84.8  PLT 99* 134* 165 180 195   Basic Metabolic Panel: Recent Labs  Lab 06/22/23 1142 06/24/23 1546 06/26/23 0506 06/26/23 1328  NA 130* 133* 132*  --   K 4.5 5.0 4.5  --   CL 103 103 104  --   CO2 23 21* 22  --   GLUCOSE 158* 231* 75 99  BUN 22 21 18   --   CREATININE 1.50* 1.81* 1.65*  --   CALCIUM  9.4 9.2 8.8*  --   PHOS  --   --  4.5  --    GFR: Estimated Creatinine Clearance: 55.8 mL/min (A) (by C-G formula based on SCr of 1.65 mg/dL (H)). Liver Function Tests: Recent Labs  Lab 06/22/23 1142 06/26/23 0506  AST 28  --   ALT 22  --   ALKPHOS 119  --   BILITOT 0.4  --   PROT 9.4*  --   ALBUMIN 3.3* 2.3*   No results for input(s): LIPASE, AMYLASE in the last 168 hours. No results for input(s): AMMONIA in the last 168 hours. Coagulation Profile: No results for input(s): INR, PROTIME in the last 168 hours. Cardiac Enzymes: No results for input(s): CKTOTAL, CKMB, CKMBINDEX, TROPONINI in the last 168 hours. BNP (last 3 results) No results for input(s): PROBNP in the last 8760 hours. HbA1C: No results for input(s): HGBA1C in the last 72 hours. CBG: Recent Labs  Lab 06/26/23 1640 06/26/23 2106 06/27/23 0416 06/27/23 0741 06/27/23 1213  GLUCAP 208* 197* 206* 169*  121*   Lipid Profile: No results for input(s): CHOL, HDL, LDLCALC, TRIG, CHOLHDL, LDLDIRECT in the last 72 hours. Thyroid  Function Tests: No results for input(s): TSH, T4TOTAL, FREET4, T3FREE, THYROIDAB in the last 72 hours. Anemia Panel: No results for input(s): VITAMINB12, FOLATE, FERRITIN, TIBC, IRON, RETICCTPCT in the last 72 hours. Urine analysis:    Component Value Date/Time   COLORURINE YELLOW 05/08/2023 1347   APPEARANCEUR CLEAR 05/08/2023 1347   LABSPEC 1.015 05/08/2023 1347   PHURINE 6.0 05/08/2023 1347   GLUCOSEU NEGATIVE 05/08/2023 1347   HGBUR SMALL (A) 05/08/2023 1347   BILIRUBINUR NEGATIVE 05/08/2023 1347   BILIRUBINUR Negative 08/18/2022 1616   KETONESUR NEGATIVE 05/08/2023 1347   PROTEINUR NEGATIVE 05/08/2023 1347   UROBILINOGEN 0.2 08/18/2022 1616   UROBILINOGEN 0.2 06/27/2011 1205   NITRITE NEGATIVE 05/08/2023 1347   LEUKOCYTESUR TRACE (A) 05/08/2023 1347   Sepsis Labs: @LABRCNTIP (procalcitonin:4,lacticidven:4)  )No results found for this or any previous visit (  from the past 240 hours).       Radiology Studies: PERIPHERAL VASCULAR CATHETERIZATION Result Date: 06/26/2023 DATE OF SERVICE: 06/26/2023  PATIENT:  Denece Satterfield  68 y.o. male  PRE-OPERATIVE DIAGNOSIS:  left lower extremity iliofemoral deep venous thrombosis  POST-OPERATIVE DIAGNOSIS:  Same; may-thurner syndrome  PROCEDURE:  1) Ultrasound guided left popliteal vein access 2) intravascular ultrasound of inferior vena cava, left common iliac vein, left external iliac vein, left common femoral vein, left femoral vein 3) left lower extremity and abdominal inferior vena cava venogram 4) endovascular mechanical thrombectomy of inferior vena cava, left common iliac vein, left external iliac vein, left common femoral vein, left femoral veindeep venous thrombosis using CAT 16 penumbra device. 5) left common iliac vein angioplasty stenting (18 x 100 mm Abre) 6) Conscious sedation  (50 minutes)  CPT: 23062, 37252, 37253 x 4, 75820, 75825, 37184, 37185 x 4, 37238, 99152, 00846 x 3  SURGEON:  Debby SAILOR. Magda, MD  ASSISTANT: none  ANESTHESIA:   local and IV sedation  ESTIMATED BLOOD LOSS: minimal  LOCAL MEDICATIONS USED:  LIDOCAINE   COUNTS: confirmed correct.  PATIENT DISPOSITION:  PACU - hemodynamically stable.  Delay start of Pharmacological VTE agent (>24hrs) due to surgical blood loss or risk of bleeding: no  INDICATION FOR PROCEDURE: Dandy Lazaro is a 68 y.o. male with extensive left lower extremity deep venous thrombosis. After careful discussion of risks, benefits, and alternatives the patient was offered left lower extremity thrombectomy. The patient understood and wished to proceed.  OPERATIVE FINDINGS: Occlusive thrombus of the left lower extremity from the popliteal vein to the external iliac vein, as suggested by preoperative duplex.  Good technical result from thrombectomy.  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  After thrombectomy, nearly all thrombus was cleared.  Improved flow was noted through the left lower extremity.  The stenosis persisted, and so I elected to stent this.  Good technical result from angioplasty and stenting on both IVUS and venogram.  DESCRIPTION OF PROCEDURE: After identification of the patient in the pre-operative holding area, the patient was transferred to the operating room. The patient was positioned supine on the operating room table. Anesthesia was induced. The groins was prepped and draped in standard fashion. A surgical pause was performed confirming correct patient, procedure, and operative location.  The skin overlaying the left popliteal vein was anesthetized with 1% lidocaine .  Micropuncture access was obtained in the left popliteal vein.  Using Seldinger technique, the micro sheath was introduced into the vein.  A Glidewire advantage was navigated into the inferior  vena cava without difficulty.  Access was upsized to 8 French.  Intravascular ultrasound was performed of the inferior vena cava, left common iliac vein, left external iliac vein, left common femoral vein, left femoral vein.  Ascending venogram was performed.  Both showed occlusive thrombus in the leg.  Patient was systemically heparinized.   Access was upsized to a 16 French dry seal.  A CAT 16 penumbra thrombectomy device was used to perform thrombectomy.  Good technical result was achieved.  Nearly all thrombus was removed.  Repeat venogram and ultrasound was performed.  Good technical result was achieved from thrombectomy.  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  I used intravascular ultrasound to measure the diameter of the healthy appearing common iliac vein.  This measured 16 mm.  I selected an 18 mm x 100 mm Abre  stent and deployed this across the lesion overhang into the inferior vena cava.  I angioplastied this with a 16 mm Atlas balloon.  Repeat venogram and intravascular ultrasound was performed showing good technical result from intervention.  All endovascular equipment was removed.  A U-stitch was applied to the access site with a bolster.  Hemostasis was achieved.  The left leg was wrapped  Conscious sedation was administered with the use of IV fentanyl  and midazolam  under continuous physician and nurse monitoring.  Heart rate, blood pressure, and oxygen saturation were continuously monitored.  Total sedation time was 50 minutes  Upon completion of the case instrument and sharps counts were confirmed correct. The patient was transferred to the PACU in good condition. I was present for all portions of the procedure.  PLAN: Start heparin  infusion.  Okay to start DOAC tomorrow if access site is hemostatic.  Patient needs aspirin  and DOAC for at least 6 months.  Follow-up with me in 4-6 weeks.  Debby SAILOR. Magda, MD FACS [facility] Phone Number: [phone] 06/26/2023 1:33 PM        Scheduled Meds:  amLODipine   10 mg Oral Daily   aspirin  EC  81 mg Oral Daily   atorvastatin   40 mg Oral Daily   dextrose   50 mL Intravenous Once   enalapril   20 mg Oral BID   insulin  aspart  0-9 Units Subcutaneous TID WC   insulin  glargine-yfgn  16 Units Subcutaneous QHS   sodium chloride  flush  3 mL Intravenous Q12H   Continuous Infusions:  sodium chloride      heparin  1,300 Units/hr (06/27/23 0913)     LOS: 1 day    Time spent: 35 minutes.    Leatrice Chapel, MD  [facility] Pager #: [phone] 7PM-7AM contact night coverage as above

## 2023-06-27 NOTE — Progress Notes (Signed)
 Notified that pt's Hgb was 6.4 this a.m; attempted to notify Toniann Fail, MD. Awaiting further orders.   Bari Edward, RN

## 2023-06-28 ENCOUNTER — Encounter: Payer: Self-pay | Admitting: Physician Assistant

## 2023-06-28 DIAGNOSIS — I2699 Other pulmonary embolism without acute cor pulmonale: Secondary | ICD-10-CM | POA: Diagnosis not present

## 2023-06-28 LAB — TYPE AND SCREEN
ABO/RH(D): B POS
Antibody Screen: NEGATIVE
Unit division: 0

## 2023-06-28 LAB — BASIC METABOLIC PANEL
Anion gap: 7 (ref 5–15)
BUN: 14 mg/dL (ref 8–23)
CO2: 23 mmol/L (ref 22–32)
Calcium: 9 mg/dL (ref 8.9–10.3)
Chloride: 102 mmol/L (ref 98–111)
Creatinine, Ser: 1.54 mg/dL — ABNORMAL HIGH (ref 0.61–1.24)
GFR, Estimated: 49 mL/min — ABNORMAL LOW (ref >60)
Glucose, Bld: 120 mg/dL — ABNORMAL HIGH (ref 70–99)
Potassium: 4.8 mmol/L (ref 3.5–5.1)
Sodium: 132 mmol/L — ABNORMAL LOW (ref 135–145)

## 2023-06-28 LAB — BPAM RBC
Blood Product Expiration Date: 202501232359
ISSUE DATE / TIME: 202501040931
Unit Type and Rh: 7300

## 2023-06-28 LAB — CBC
HCT: 21.8 % — ABNORMAL LOW (ref 39.0–52.0)
Hemoglobin: 7.4 g/dL — ABNORMAL LOW (ref 13.0–17.0)
MCH: 28.2 pg (ref 26.0–34.0)
MCHC: 33.9 g/dL (ref 30.0–36.0)
MCV: 83.2 fL (ref 80.0–100.0)
Platelets: 189 10*3/uL (ref 150–400)
RBC: 2.62 MIL/uL — ABNORMAL LOW (ref 4.22–5.81)
RDW: 15.8 % — ABNORMAL HIGH (ref 11.5–15.5)
WBC: 5.3 10*3/uL (ref 4.0–10.5)
nRBC: 0 % (ref 0.0–0.2)

## 2023-06-28 LAB — GLUCOSE, CAPILLARY
Glucose-Capillary: 123 mg/dL — ABNORMAL HIGH (ref 70–99)
Glucose-Capillary: 226 mg/dL — ABNORMAL HIGH (ref 70–99)

## 2023-06-28 LAB — HEPARIN LEVEL (UNFRACTIONATED)
Heparin Unfractionated: 0.44 [IU]/mL (ref 0.30–0.70)
Heparin Unfractionated: 0.46 [IU]/mL (ref 0.30–0.70)

## 2023-06-28 LAB — HEMOGLOBIN AND HEMATOCRIT, BLOOD
HCT: 22.6 % — ABNORMAL LOW (ref 39.0–52.0)
Hemoglobin: 7.6 g/dL — ABNORMAL LOW (ref 13.0–17.0)

## 2023-06-28 MED ORDER — APIXABAN 5 MG PO TABS: 5.0000 mg | ORAL_TABLET | Freq: Two times a day (BID) | ORAL | Status: DC

## 2023-06-28 MED ORDER — APIXABAN (ELIQUIS) VTE STARTER PACK (10MG AND 5MG): ORAL_TABLET | ORAL | 0 refills | Status: DC

## 2023-06-28 MED ORDER — INSULIN GLARGINE 100 UNIT/ML ~~LOC~~ SOLN: 16.0000 [IU] | Freq: Every day | SUBCUTANEOUS | 11 refills | Status: DC

## 2023-06-28 MED ORDER — HEPARIN (PORCINE) 25000 UT/250ML-% IV SOLN: 1200.0000 [IU]/h | INTRAVENOUS | Status: DC

## 2023-06-28 MED ORDER — APIXABAN 5 MG PO TABS
10.0000 mg | ORAL_TABLET | Freq: Two times a day (BID) | ORAL | Status: DC
  Administered 2023-06-28: 10 mg via ORAL
  Filled 2023-06-28: qty 2

## 2023-06-28 NOTE — Progress Notes (Addendum)
 PHARMACY - ANTICOAGULATION CONSULT NOTE  Pharmacy Consult for heparin  (2 hours post sheath removal) Indication: pulmonary embolus and DVT s/p thrombectomy  No Known Allergies  Patient Measurements: Height: 6' 3 (190.5 cm) Weight: 106.6 kg (235 lb) IBW/kg (Calculated) : 84.5 Heparin  Dosing Weight: 105 kg  Vital Signs: Temp: 98.5 F (36.9 C) (01/05 0822) Temp Source: Oral (01/05 0822) BP: 105/53 (01/05 0822) Pulse Rate: 78 (01/05 0822)  Labs: Recent Labs    06/26/23 0506 06/26/23 2214 06/27/23 0538 06/27/23 0751 06/27/23 1559 06/28/23 0117 06/28/23 0837  HGB 7.4*  --  6.4* 7.2*  --  7.4*  --   HCT 22.7*  --  19.6* 21.7*  --  21.8*  --   PLT 165  --  180 195  --  189  --   HEPARINUNFRC  --    < > 0.92*  --  0.77* 0.44 0.46  CREATININE 1.65*  --   --   --   --   --  1.54*   < > = values in this interval not displayed.    Estimated Creatinine Clearance: 59.7 mL/min (A) (by C-G formula based on SCr of 1.54 mg/dL (H)).   Medical History: Past Medical History:  Diagnosis Date   Diabetes mellitus    High cholesterol    Hypertension     Assessment: 68 yo M with small PE and L DVT. Pt undergoing chemotherapy. Now s/p thrombectomy. Pharmacy consulted to start heparin  2 hours post sheath removal. Last dose of Lovenox  on 1/2 @ 2108. Sheath removal today @ 1235.   1/5 AM: Heparin  level remains therapeutic at 0.46 on 1200 units/hr. Hgb stable at 7.4, PLT stable at 189. He did receive 1 unit PRBC on 1/4. No issues with infusion and no bleeding noted per RN.  Goal of Therapy:  Heparin  level 0.3-0.7 units/ml Monitor platelets by anticoagulation protocol: Yes   Plan:  Continue heparin  to 1200 units/hr  Check anti-Xa level daily while on heparin  Continue to monitor H&H and platelets Per vascular note: Okay to start DOAC if access site is hemostatic. Patient needs aspirin  and DOAC for at least 6 months. Follow-up transition to DOAC.  Josefa Range, PharmD PGY1 Pharmacy  Resident 06/28/2023 9:58 AM   ---------------------------------------------------------- Addendum 1/5 @ 13:58 Patient to now be transitioned from heparin  to Eliquis  to continue treatment for a PE and DVT. Repeat Hgb obtained today and stable at 7.6.   Plan: Stop heparin  infusion Start Eliquis  10 mg PO BID x 7 days followed by 5 mg PO BID thereafter  Josefa Range, PharmD PGY1 Pharmacy Resident 06/28/2023 2:03 PM

## 2023-06-28 NOTE — Progress Notes (Signed)
 PHARMACY - ANTICOAGULATION CONSULT NOTE  Pharmacy Consult for heparin  (2 hours post sheath removal) Indication: pulmonary embolus and DVT s/p thrombectomy  No Known Allergies  Patient Measurements: Height: 6' 3 (190.5 cm) Weight: 106.6 kg (235 lb) IBW/kg (Calculated) : 84.5 Heparin  Dosing Weight: 105 kg  Vital Signs: Temp: 98.9 F (37.2 C) (01/04 2015) Temp Source: Oral (01/04 2015) BP: 131/71 (01/04 2015) Pulse Rate: 76 (01/04 2015)  Labs: Recent Labs    06/26/23 0506 06/26/23 2214 06/27/23 0538 06/27/23 0751 06/27/23 1559 06/28/23 0117  HGB 7.4*  --  6.4* 7.2*  --  7.4*  HCT 22.7*  --  19.6* 21.7*  --  21.8*  PLT 165  --  180 195  --  189  HEPARINUNFRC  --    < > 0.92*  --  0.77* 0.44  CREATININE 1.65*  --   --   --   --   --    < > = values in this interval not displayed.    Estimated Creatinine Clearance: 55.8 mL/min (A) (by C-G formula based on SCr of 1.65 mg/dL (H)).   Medical History: Past Medical History:  Diagnosis Date   Diabetes mellitus    High cholesterol    Hypertension     Assessment: 68 yo M with small PE and L DVT. Pt undergoing chemotherapy. Now s/p thrombectomy. Pharmacy consulted to start heparin  2 hours post sheath removal. Last dose of Lovenox  on 1/2 @ 2108. Sheath removal today @ 1235.   1/4 AM: Heparin  level remains supratherapeutic at 0.92 despite decreasing the rate to 1500 units/hr. Hgb this morning was 6.4, repeated and it was 7.2. Patient to receive 1 unit PRBC today. PLT stable at 195. No issues with infusion and no bleeding per RN. MD aware of drop in Hgb and is okay with continuing heparin  for now.  1/5 AM: heparin  level therapeutic at 0.44 with rate decreased to 1200 units/hr. No visible bleeding reported per RN.  Goal of Therapy:  Heparin  level 0.3-0.7 units/ml Monitor platelets by anticoagulation protocol: Yes   Plan:  Continue heparin  to 1200 units/hr  Check anti-Xa level in 8 hours and daily while on heparin  Continue  to monitor H&H and platelets Per vascular note: Okay to start DOAC if access site is hemostatic. Patient needs aspirin  and DOAC for at least 6 months. Follow-up transition to DOAC.  Lynwood Poplar, PharmD. Clinical Pharmacist 06/28/2023 1:45 AM

## 2023-06-28 NOTE — Discharge Summary (Signed)
 Physician Discharge Summary  Patient ID: William Mcguire MRN: [id number] DOB/AGE: 70-May-1957 70 y.o.  Admit date: 06/24/2023 Discharge date: 06/28/2023  Admission Diagnoses:  Discharge Diagnoses:  Principal Problem:   Acute pulmonary embolism without acute cor pulmonale (HCC) Active Problems:   T2DM (type 2 diabetes mellitus) (HCC)   HTN (hypertension)   Adenocarcinoma of left lung (HCC)   CKD stage 3a, GFR 45-59 ml/min (HCC)   Acute deep vein thrombosis (DVT) of femoral vein of left lower extremity (HCC)   Multiple subsegmental pulmonary emboli without acute cor pulmonale (HCC)   Acute pulmonary embolism (HCC)   Discharged Condition: stable  Hospital Course:  Patient is a 68 year old male with past medical history significant for adenocarcinoma of left lung (stage II), status post lobectomy and currently on chemotherapy; diabetes mellitus type 2, hypertension, hyperlipidemia and chronic kidney disease stage III.  Patient has never smoked cigarettes.  Patient was admitted with extensive DVT of left lower extremity and acute pulmonary embolism.  Patient was admitted and managed with heparin .  Hematology/oncology team was consulted to assist with patient's management.  Due to significant clot burden, vascular surgery team was consulted for possible mechanical thrombectomy of left lower extremity.  Patient underwent mechanical thrombectomy and iliac vein stenting.  Heparin  was continued after the procedure.  Patient will be discharged back home today on Eliquis .  During the hospital stay, episodes of hypoglycemia were noted.  Hypoglycemia was managed with glucose.  Drop in hemoglobin was also noted.  No obvious bleeding.  Patient was transfused 1 unit of packed red blood cells.  Hemoglobin has remained stable.  Hemoglobin prior to discharge 7.6 g/dL.  Patient will follow-up with primary care provider, hematology/oncology team and vascular surgery team within 1 week of discharge.    Acute pulmonary  embolism without acute cor pulmonale (HCC)/extensive lower extremity DVT: -Above documentation. -Patient underwent thrombectomy of left lower extremity and iliac vein stenting. -Vascular surgery input is appreciated.   -Input from the hematology/oncology team is appreciated. -Patient is known to Dr. Gatha Gatha, local oncologist.   Patient will follow-up with Dr. Gatha on discharge. -Patient is status longer lobectomy for stage II lung CVA and currently on chemotherapy (NSCLC adenocarcinoma).   -Patient will be discharged back home on Eliquis .   Adenocarcinoma of left lung (HCC) On adjuvant chemo, they had throught they had gotten all of the tumor with lobectomy in Sept. No definite evidence to suggest recurrence on CTA chest: has some focus of nodularity in lung but this is unchanged compared to prior imaging.   CKD stage 3a, GFR 45-59 ml/min (HCC) -Stable.   Hypertension:  Cont amlodipine , enalapril    T2DM (type 2 diabetes mellitus) (HCC) Cont Lantus  21u at bedtime Sensitive SSI AC for the moment Has been holding trulicity  for procedure (supposed to have screening colonoscopy tomorrow), will leave this on hold for the moment 06/26/2023: Hypoglycemia noted today.  We managed with D50 and D10.  Monitor blood sugar closely.  Anemia: -Likely multifactorial. -Patient has history of stage II lung cancer, currently on chemotherapy. -Patient was volume resuscitated during the hospital stay. -Patient was transfused with 1 unit of packed red blood cells. -Patient is known to have chronic anemia.  Patient is known to the hematology/oncology team. -No obvious bleeding source.   Consults: hematology/oncology and vascular surgery  Significant Diagnostic Studies:  CT ANGIOGRAPHY CHEST WITH CONTRAST   TECHNIQUE: Multidetector CT imaging of the chest was performed using the standard protocol during bolus administration of intravenous contrast. Multiplanar CT  image reconstructions  and MIPs were obtained to evaluate the vascular anatomy.   RADIATION DOSE REDUCTION: This exam was performed according to the departmental dose-optimization program which includes automated exposure control, adjustment of the mA and/or kV according to patient size and/or use of iterative reconstruction technique.   CONTRAST:  60mL OMNIPAQUE  IOHEXOL  350 MG/ML SOLN   COMPARISON:  Radiograph 05/05/2023, CT 03/26/2023   FINDINGS: Cardiovascular: Multilobar acute segmental pulmonary emboli in the right lung. Thromboembolic burden is small to moderate. There is no right heart strain. The RV to LV ratio is 0.79. The heart is normal in size. Thoracic aorta is normal in caliber. No acute aortic findings. Common origin of brachiocephalic and left common carotid artery. No pericardial effusion.   Mediastinum/Nodes: Postsurgical change at the left hilum. No enlarged mediastinal or hilar lymph nodes. Thyroid  nodules are less well-defined on the current exam given phase of contrast. Patulous esophagus without wall thickening.   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 The previous hydropneumothorax has improved. Small amount of pleural fluid persists including a small loculated components in the anterior upper lobe. No definite pleural thickening/enhancement. Clustered left lung nodule series 6, image 51, are unchanged from prior exam.   No evidence of pulmonary infarct or focal right lung opacity. No right pleural effusion.   Upper Abdomen: No acute findings.   Musculoskeletal: Diffuse thoracic spondylosis with anterior spurring. No evidence of focal bone lesion. Minimal bilateral gynecomastia, more so on the right.   Review of the MIP images confirms the above findings.   IMPRESSION: 1. Multilobar acute segmental pulmonary emboli in the right lung. Thromboembolic burden is small to moderate. No right  heart strain. 2. Postsurgical change of left upper lobectomy. The previous hydropneumothorax has improved. Small amount of pleural fluid persists including a small loculated components in the anterior upper lobe. 3. Clustered left lung nodules are unchanged from prior exam. Recommend attention at follow-up.   Critical Value/emergent results were called by telephone at the time of interpretation on 06/24/2023 at 8:35 pm to provider BROOKE SMALL , who verbally acknowledged these results.     Electronically Signed   By: Andrea Gasman M.D.   On: 06/24/2023 20:37    Echocardiogram revealed: 1. Left ventricular ejection fraction, by estimation, is 60 to 65%. The  left ventricle has normal function. The left ventricle has no regional  wall motion abnormalities. Left ventricular diastolic parameters are  consistent with Grade I diastolic  dysfunction (impaired relaxation).   2. Right ventricular systolic function is normal. The right ventricular  size is mildly enlarged. There is normal pulmonary artery systolic  pressure. The estimated right ventricular systolic pressure is 13.9 mmHg.   3. The mitral valve is normal in structure. Trivial mitral valve  regurgitation. No evidence of mitral stenosis.   4. The aortic valve is tricuspid. Aortic valve regurgitation is not  visualized. No aortic stenosis is present.   5. There is mild dilatation of the ascending aorta, measuring 41 mm.   6. The inferior vena cava is normal in size with greater than 50%  respiratory variability, suggesting right atrial pressure of 3 mmHg.     Vascular (venous) Doppler ultrasound of lower extremities revealed: RIGHT:  - No evidence of common femoral vein obstruction.   LEFT:  - Findings consistent with acute deep vein thrombosis involving the left  common femoral vein, SF junction, left femoral vein, left proximal  profunda vein, left popliteal vein,  and external iliac (mid and distal).    No cystic  structure found in the popliteal fossa.  Subcutaneous edema noted in calf and ankle.     *See table(s) above for measurements and observations.   Electronically signed by Penne Colorado MD on 06/25/2023 at 8:12:00 AM.        Treatments:  -Patient was treated with heparin .  Patient will be discharged back home on Eliquis . -During the hospital stay, patient underwent mechanical thrombectomy of left lower extremity and iliac vein stenting.    Discharge Exam: Blood pressure (!) 105/53, pulse 78, temperature 98.5 F (36.9 C), temperature source Oral, resp. rate 16, height 6' 3 (1.905 m), weight 106.6 kg, SpO2 99%.   Disposition: Discharge disposition: 01-Home or Self Care       Discharge Instructions     Diet - low sodium heart healthy   Complete by: As directed    Increase activity slowly   Complete by: As directed       Allergies as of 06/28/2023   No Known Allergies      Medication List     STOP taking these medications    chlorproMAZINE  25 MG tablet Commonly known as: THORAZINE    ondansetron  8 MG tablet Commonly known as: ZOFRAN    prochlorperazine  10 MG tablet Commonly known as: COMPAZINE        TAKE these medications    amLODipine  10 MG tablet Commonly known as: NORVASC  TAKE 1 TABLET BY MOUTH EVERY DAY   Apixaban  Starter Pack (10mg  and 5mg ) Commonly known as: ELIQUIS  STARTER PACK Take as directed on package: start with two-5mg  tablets twice daily for 7 days. On day 8, switch to one-5mg  tablet twice daily.   aspirin  EC 81 MG tablet Take 81 mg by mouth daily. Swallow whole.   atorvastatin  40 MG tablet Commonly known as: LIPITOR TAKE 1 TABLET BY MOUTH EVERY DAY   B-D INTEGRA SYRINGE 25G X 5/8 3 ML Misc Generic drug: SYRINGE-NEEDLE (DISP) 3 ML USE AS DIRECTED   cyanocobalamin  1000 MCG/ML injection Commonly known as: VITAMIN B12 INJECT 1 ML (1,000 MCG TOTAL) INTO THE MUSCLE EVERY 30 DAYS.   dexamethasone  4 MG tablet Commonly known as:  DECADRON  Take 1 tablet twice a day the day before, the day of, and the day after chemotherapy   enalapril  20 MG tablet Commonly known as: VASOTEC  TAKE 1 TABLET TWICE DAILY.   folic acid  1 MG tablet Commonly known as: FOLVITE  Take 1 tablet (1 mg total) by mouth daily.   glucose blood test strip Use as instructed to test blood sugar 3 times a day. Dx code: e11.65   OneTouch Verio test strip Generic drug: glucose blood Use as instructed to check blood sugars twice daily E11.69   insulin  glargine 100 UNIT/ML injection Commonly known as: LANTUS  Inject 0.16 mLs (16 Units total) into the skin at bedtime. What changed: how much to take   oxyCODONE  5 MG immediate release tablet Commonly known as: Oxy IR/ROXICODONE  Take 1 tablet (5 mg total) by mouth every 6 (six) hours as needed for moderate pain.   sildenafil  100 MG tablet Commonly known as: VIAGRA  TAKE 1 TABLET BY MOUTH EVERY DAY AS NEEDED (INSURANCE COVERS 10 TAB PER 77DAYS)   Trulicity  1.5 MG/0.5ML Soaj Generic drug: Dulaglutide  Inject 1.5 mg into the skin every Sunday.        Time spent: 35 minutes.  SignedBETHA Leatrice LILLETTE Rosario 06/28/2023, 2:36 PM

## 2023-06-28 NOTE — Discharge Instructions (Signed)
 Information on my medicine - ELIQUIS  (apixaban )  This medication education was reviewed with me or my healthcare representative as part of my discharge preparation.    Why was Eliquis  prescribed for you? Eliquis  was prescribed to treat blood clots that may have been found in the veins of your legs (deep vein thrombosis) or in your lungs (pulmonary embolism) and to reduce the risk of them occurring again.  What do You need to know about Eliquis  ? The starting dose is 10 mg (two 5 mg tablets) taken TWICE daily for the FIRST SEVEN (7) DAYS, then on (enter date)  07/05/23  the dose is reduced to ONE 5 mg tablet taken TWICE daily.  Eliquis  may be taken with or without food.   Try to take the dose about the same time in the morning and in the evening. If you have difficulty swallowing the tablet whole please discuss with your pharmacist how to take the medication safely.  Take Eliquis  exactly as prescribed and DO NOT stop taking Eliquis  without talking to the doctor who prescribed the medication.  Stopping may increase your risk of developing a new blood clot.  Refill your prescription before you run out.  After discharge, you should have regular check-up appointments with your healthcare provider that is prescribing your Eliquis .    What do you do if you miss a dose? If a dose of ELIQUIS  is not taken at the scheduled time, take it as soon as possible on the same day and twice-daily administration should be resumed. The dose should not be doubled to make up for a missed dose.  Important Safety Information A possible side effect of Eliquis  is bleeding. You should call your healthcare provider right away if you experience any of the following: Bleeding from an injury or your nose that does not stop. Unusual colored urine (red or dark brown) or unusual colored stools (red or black). Unusual bruising for unknown reasons. A serious fall or if you hit your head (even if there is no  bleeding).  Some medicines may interact with Eliquis  and might increase your risk of bleeding or clotting while on Eliquis . To help avoid this, consult your healthcare provider or pharmacist prior to using any new prescription or non-prescription medications, including herbals, vitamins, non-steroidal anti-inflammatory drugs (NSAIDs) and supplements.  This website has more information on Eliquis  (apixaban ): http://[email or web address] .com/eliquis dena

## 2023-06-29 ENCOUNTER — Encounter (HOSPITAL_COMMUNITY): Payer: Self-pay | Admitting: Vascular Surgery

## 2023-06-29 ENCOUNTER — Inpatient Hospital Stay: Payer: Medicare Other

## 2023-06-29 ENCOUNTER — Other Ambulatory Visit: Payer: Self-pay | Admitting: Medical Oncology

## 2023-06-29 ENCOUNTER — Inpatient Hospital Stay: Payer: Medicare Other | Attending: Hematology and Oncology | Admitting: Internal Medicine

## 2023-06-29 ENCOUNTER — Telehealth: Payer: Self-pay

## 2023-06-29 ENCOUNTER — Other Ambulatory Visit: Payer: Medicare Other

## 2023-06-29 ENCOUNTER — Ambulatory Visit: Payer: Medicare Other | Admitting: Internal Medicine

## 2023-06-29 ENCOUNTER — Other Ambulatory Visit: Payer: Self-pay | Admitting: Physician Assistant

## 2023-06-29 ENCOUNTER — Ambulatory Visit: Payer: Medicare Other

## 2023-06-29 ENCOUNTER — Inpatient Hospital Stay: Payer: Medicare Other | Attending: Hematology and Oncology

## 2023-06-29 VITALS — BP 124/65 | HR 96 | Temp 97.6°F | Resp 17 | Ht 75.0 in | Wt 236.0 lb

## 2023-06-29 DIAGNOSIS — C3412 Malignant neoplasm of upper lobe, left bronchus or lung: Secondary | ICD-10-CM | POA: Diagnosis not present

## 2023-06-29 DIAGNOSIS — T451X5A Adverse effect of antineoplastic and immunosuppressive drugs, initial encounter: Secondary | ICD-10-CM

## 2023-06-29 DIAGNOSIS — I2699 Other pulmonary embolism without acute cor pulmonale: Secondary | ICD-10-CM | POA: Diagnosis not present

## 2023-06-29 DIAGNOSIS — D6481 Anemia due to antineoplastic chemotherapy: Secondary | ICD-10-CM

## 2023-06-29 DIAGNOSIS — Z7901 Long term (current) use of anticoagulants: Secondary | ICD-10-CM | POA: Diagnosis not present

## 2023-06-29 DIAGNOSIS — D649 Anemia, unspecified: Secondary | ICD-10-CM | POA: Insufficient documentation

## 2023-06-29 DIAGNOSIS — Z902 Acquired absence of lung [part of]: Secondary | ICD-10-CM | POA: Diagnosis not present

## 2023-06-29 DIAGNOSIS — C3490 Malignant neoplasm of unspecified part of unspecified bronchus or lung: Secondary | ICD-10-CM

## 2023-06-29 DIAGNOSIS — N189 Chronic kidney disease, unspecified: Secondary | ICD-10-CM | POA: Insufficient documentation

## 2023-06-29 DIAGNOSIS — I82402 Acute embolism and thrombosis of unspecified deep veins of left lower extremity: Secondary | ICD-10-CM | POA: Diagnosis not present

## 2023-06-29 DIAGNOSIS — C3492 Malignant neoplasm of unspecified part of left bronchus or lung: Secondary | ICD-10-CM | POA: Diagnosis not present

## 2023-06-29 DIAGNOSIS — Z5111 Encounter for antineoplastic chemotherapy: Secondary | ICD-10-CM | POA: Diagnosis not present

## 2023-06-29 LAB — SAMPLE TO BLOOD BANK

## 2023-06-29 MED ORDER — ACETAMINOPHEN 325 MG PO TABS
650.0000 mg | ORAL_TABLET | Freq: Once | ORAL | Status: AC
  Administered 2023-06-29: 650 mg via ORAL
  Filled 2023-06-29: qty 2

## 2023-06-29 MED ORDER — SODIUM CHLORIDE 0.9% IV SOLUTION
250.0000 mL | INTRAVENOUS | Status: DC
  Administered 2023-06-29: 100 mL via INTRAVENOUS

## 2023-06-29 MED ORDER — DIPHENHYDRAMINE HCL 25 MG PO CAPS
25.0000 mg | ORAL_CAPSULE | Freq: Once | ORAL | Status: AC
  Administered 2023-06-29: 25 mg via ORAL
  Filled 2023-06-29: qty 1

## 2023-06-29 NOTE — Progress Notes (Signed)
 William Mcguire

## 2023-06-29 NOTE — Transitions of Care (Post Inpatient/ED Visit) (Signed)
   06/29/2023  Name: Ramaj Frangos MRN: [id number] DOB: 1956-06-16  Today's TOC FU Call Status: Today's TOC FU Call Status:: Unsuccessful Call (1st Attempt) Unsuccessful Call (1st Attempt) Date: 06/29/23  Attempted to reach the patient regarding the most recent Inpatient/ED visit and offer 30-day TOC program.  Follow Up Plan: Additional outreach attempts will be made to reach the patient to complete the Transitions of Care (Post Inpatient/ED visit) call.      Rama Pilling, RN,BSN,CCM RN Care Manager Transitions of Care  [facility]  Direct Phone: [phone] Toll Free: [phone] Fax: [phone]

## 2023-06-29 NOTE — Transitions of Care (Post Inpatient/ED Visit) (Signed)
   06/29/2023  Name: William Mcguire MRN: [id number] DOB: 03-Dec-1955  Today's TOC FU Call Status: Today's TOC FU Call Status:: Unsuccessful Call (1st Attempt) Unsuccessful Call (1st Attempt) Date: 06/29/23  Attempted to reach the patient regarding the most recent Inpatient/ED visit.  Follow Up Plan: Additional outreach attempts will be made to reach the patient to complete the Transitions of Care (Post Inpatient/ED visit) call.   Signature YL,RMA

## 2023-06-29 NOTE — Progress Notes (Signed)
Blood transfusion orders entered.

## 2023-06-29 NOTE — Progress Notes (Signed)
 [facility] [phone]   [phone]) [phone]  OFFICE PROGRESS NOTE  Jarold Medici, MD [address] [place] [address]  DIAGNOSIS:  1) Stage IIA (T2b, N0, M0) non-small cell lung cancer, adenocarcinoma. He presented with a left upper lobe lesion. He was diagnosed in 2024.  2) left lower extremity deep venous thrombosis as well as pulmonary emboli diagnosed in January 2025  Molecular studies  no actionable mutations. Has KRAS G12V which is not actionable   PDL1: 6%   PRIOR THERAPY:   1) status post right upper lobectomy with lymph node sampling by Dr. Shyrl on March 18, 2023.  2) vascular mechanical thrombectomy of the inferior vena cava, left common iliac vein, left external iliac vein, left common femoral vein, left femoral vein deep venous thrombosis with left common iliac vein angioplasty stenting under the care of Dr. Magda on June 26, 2023.   CURRENT THERAPY: Adjuvant treatment with carboplatin  and Alimta  for 4 cycles. He is not a good candidate for cisplatin due to his CKD. He is status post 3 cycles. First dose on 04/13/23  INTERVAL HISTORY: William Mcguire 68 y.o. male returns to the clinic today for follow-up visit accompanied by his wife.Discussed the use of AI scribe software for clinical note transcription with the patient, who gave verbal consent to proceed.  History of Present Illness   The patient, diagnosed with stage 2A Non-small cell lung cancer, adenocarcinoma in September 2024, underwent right upper lobectomy and initiated chemotherapy with carboplatin  and Alimta . He has completed three cycles of chemotherapy, with treatments scheduled every three weeks.  Recently, the patient experienced swelling in his left leg, which began on a Monday and was shown to his nurse wife the following day. The swelling was significant, extending from the thigh down to the foot. The patient sought medical attention and was  diagnosed with a pulmonary embolism and extensive clots in the left leg following a Doppler of the lower extremities and a CT angiogram of the chest.  Subsequently, the patient underwent surgery for clot removal and was started on Eliquis . He is currently on a starter kit, taking 10mg  twice a day, with plans to reduce to 5mg  twice a day after a week. The patient reports feeling weak due to being bedridden but otherwise does not feel unwell. The swelling in the leg has significantly reduced but is still present.  The patient denies any other complaints, specifically denying nausea, vomiting, and diarrhea. He did, however, experience hiccups for three days following the first day of steroid treatment. The patient also received a blood transfusion during his hospital stay due to anemia.       MEDICAL HISTORY: Past Medical History:  Diagnosis Date   Diabetes mellitus    High cholesterol    Hypertension     ALLERGIES:  has no known allergies.  MEDICATIONS:  Current Outpatient Medications  Medication Sig Dispense Refill   amLODipine  (NORVASC ) 10 MG tablet TAKE 1 TABLET BY MOUTH EVERY DAY 90 tablet 1   APIXABAN  (ELIQUIS ) VTE STARTER PACK (10MG  AND 5MG ) Take as directed on package: start with two-5mg  tablets twice daily for 7 days. On day 8, switch to one-5mg  tablet twice daily. 74 each 0   aspirin  EC 81 MG tablet Take 81 mg by mouth daily. Swallow whole.     atorvastatin  (LIPITOR) 40 MG tablet TAKE 1 TABLET BY MOUTH EVERY DAY 90 tablet 1   cyanocobalamin  (VITAMIN B12) 1000 MCG/ML injection  INJECT 1 ML (1,000 MCG TOTAL) INTO THE MUSCLE EVERY 30 DAYS. 9 mL 1   dexamethasone  (DECADRON ) 4 MG tablet Take 1 tablet twice a day the day before, the day of, and the day after chemotherapy 40 tablet 2   Dulaglutide  (TRULICITY ) 1.5 MG/0.5ML SOAJ Inject 1.5 mg into the skin every Sunday.     enalapril  (VASOTEC ) 20 MG tablet TAKE 1 TABLET TWICE DAILY. 90 tablet 1   folic acid  (FOLVITE ) 1 MG tablet Take 1  tablet (1 mg total) by mouth daily. 30 tablet 2   glucose blood (ONETOUCH VERIO) test strip Use as instructed to check blood sugars twice daily E11.69 100 each 2   glucose blood test strip Use as instructed to test blood sugar 3 times a day. Dx code: e11.65 100 each 2   insulin  glargine (LANTUS ) 100 UNIT/ML injection Inject 0.16 mLs (16 Units total) into the skin at bedtime. 10 mL 11   oxyCODONE  (OXY IR/ROXICODONE ) 5 MG immediate release tablet Take 1 tablet (5 mg total) by mouth every 6 (six) hours as needed for moderate pain. 28 tablet 0   sildenafil  (VIAGRA ) 100 MG tablet TAKE 1 TABLET BY MOUTH EVERY DAY AS NEEDED (INSURANCE COVERS 10 TAB PER 77DAYS) 10 tablet 5   SYRINGE-NEEDLE, DISP, 3 ML (B-D INTEGRA SYRINGE) 25G X 5/8 3 ML MISC USE AS DIRECTED 12 each 24   No current facility-administered medications for this visit.    SURGICAL HISTORY:  Past Surgical History:  Procedure Laterality Date   BRONCHIAL BIOPSY  01/05/2023   Procedure: BRONCHIAL BIOPSIES;  Surgeon: Brenna Adine CROME, DO;  Location: [facility];  Service: Pulmonary;;   BRONCHIAL BRUSHINGS  01/05/2023   Procedure: BRONCHIAL BRUSHINGS;  Surgeon: Brenna Adine CROME, DO;  Location: [facility];  Service: Pulmonary;;   BRONCHIAL NEEDLE ASPIRATION BIOPSY  01/05/2023   Procedure: BRONCHIAL NEEDLE ASPIRATION BIOPSIES;  Surgeon: Brenna Adine CROME, DO;  Location: [facility];  Service: Pulmonary;;   INTERCOSTAL NERVE BLOCK Left 03/18/2023   Procedure: INTERCOSTAL NERVE BLOCK;  Surgeon: Shyrl Linnie KIDD, MD;  Location: [facility] OR;  Service: Thoracic;  Laterality: Left;   KNEE SURGERY Left 2004   torn cartilage   LYMPH NODE BIOPSY Left 03/18/2023   Procedure: LYMPH NODE BIOPSY;  Surgeon: Shyrl Linnie KIDD, MD;  Location: [facility] OR;  Service: Thoracic;  Laterality: Left;   PATELLAR TENDON REPAIR  06/11/2011   Procedure: PATELLA TENDON REPAIR;  Surgeon: Cordella Glendia Hutchinson;  Location: [facility]S;  Service: Orthopedics;  Laterality: Right;    PERIPHERAL VASCULAR INTERVENTION  06/26/2023   Procedure: PERIPHERAL VASCULAR INTERVENTION;  Surgeon: Magda Debby SAILOR, MD;  Location: [facility] INVASIVE CV [facility];  Service: Cardiovascular;;   PERIPHERAL VASCULAR THROMBECTOMY Left 06/26/2023   Procedure: PERIPHERAL VASCULAR THROMBECTOMY;  Surgeon: Magda Debby SAILOR, MD;  Location: [facility];  Service: Cardiovascular;  Laterality: Left;   PERIPHERAL VASCULAR ULTRASOUND/IVUS  06/26/2023   Procedure: Peripheral Vascular Ultrasound/IVUS;  Surgeon: Magda Debby SAILOR, MD;  Location: [facility] INVASIVE CV LAB;  Service: Cardiovascular;;    REVIEW OF SYSTEMS:  Constitutional: positive for fatigue Eyes: negative Ears, nose, mouth, throat, and face: negative Respiratory: positive for dyspnea on exertion Cardiovascular: negative Gastrointestinal: negative Genitourinary:negative Integument/breast: negative Hematologic/lymphatic: negative Musculoskeletal:negative Neurological: negative Behavioral/Psych: negative Endocrine: negative Allergic/Immunologic: negative   PHYSICAL EXAMINATION: General appearance: alert, cooperative, appears stated age, fatigued, and no distress Head: Normocephalic, without obvious abnormality, atraumatic Neck: no adenopathy, no JVD, supple, symmetrical, trachea midline, and thyroid  not enlarged, symmetric, no tenderness/mass/nodules  Lymph nodes: Cervical, supraclavicular, and axillary nodes normal. Resp: clear to auscultation bilaterally Back: symmetric, no curvature. ROM normal. No CVA tenderness. Cardio: regular rate and rhythm, S1, S2 normal, no murmur, click, rub or gallop GI: soft, non-tender; bowel sounds normal; no masses,  no organomegaly Extremities: edema 1+ edema left lower extremity Neurologic: Alert and oriented X 3, normal strength and tone. Normal symmetric reflexes. Normal coordination and gait  ECOG PERFORMANCE STATUS: 1 - Symptomatic but completely ambulatory  Blood pressure 124/65, pulse 96, temperature 97.6 F  (36.4 C), temperature source Temporal, resp. rate 17, height 6' 3 (1.905 m), weight 236 lb (107 kg), SpO2 98%.  LABORATORY DATA: Lab Results  Component Value Date   WBC 5.3 06/28/2023   HGB 7.6 (L) 06/28/2023   HCT 22.6 (L) 06/28/2023   MCV 83.2 06/28/2023   PLT 189 06/28/2023      Chemistry      Component Value Date/Time   NA 132 (L) 06/28/2023 0837   NA 137 11/05/2022 1152   K 4.8 06/28/2023 0837   CL 102 06/28/2023 0837   CO2 23 06/28/2023 0837   BUN 14 06/28/2023 0837   BUN 11 11/05/2022 1152   CREATININE 1.54 (H) 06/28/2023 0837   CREATININE 1.50 (H) 06/22/2023 1142      Component Value Date/Time   CALCIUM  9.0 06/28/2023 0837   ALKPHOS 119 06/22/2023 1142   AST 28 06/22/2023 1142   ALT 22 06/22/2023 1142   BILITOT 0.4 06/22/2023 1142       RADIOGRAPHIC STUDIES: PERIPHERAL VASCULAR CATHETERIZATION Result Date: 06/26/2023 DATE OF SERVICE: 06/26/2023  PATIENT:  Denece Satterfield  68 y.o. male  PRE-OPERATIVE DIAGNOSIS:  left lower extremity iliofemoral deep venous thrombosis  POST-OPERATIVE DIAGNOSIS:  Same; may-thurner syndrome  PROCEDURE:  1) Ultrasound guided left popliteal vein access 2) intravascular ultrasound of inferior vena cava, left common iliac vein, left external iliac vein, left common femoral vein, left femoral vein 3) left lower extremity and abdominal inferior vena cava venogram 4) endovascular mechanical thrombectomy of inferior vena cava, left common iliac vein, left external iliac vein, left common femoral vein, left femoral veindeep venous thrombosis using CAT 16 penumbra device. 5) left common iliac vein angioplasty stenting (18 x 100 mm Abre) 6) Conscious sedation (50 minutes)  CPT: 23062, 37252, 37253 x 4, 75820, 75825, 37184, 37185 x 4, 37238, 99152, 00846 x 3  SURGEON:  Debby SAILOR. Magda, MD  ASSISTANT: none  ANESTHESIA:   local and IV sedation  ESTIMATED BLOOD LOSS: minimal  LOCAL MEDICATIONS USED:  LIDOCAINE   COUNTS: confirmed correct.  PATIENT  DISPOSITION:  PACU - hemodynamically stable.  Delay start of Pharmacological VTE agent (>24hrs) due to surgical blood loss or risk of bleeding: no  INDICATION FOR PROCEDURE: Azarel Banner is a 68 y.o. male with extensive left lower extremity deep venous thrombosis. After careful discussion of risks, benefits, and alternatives the patient was offered left lower extremity thrombectomy. The patient understood and wished to proceed.  OPERATIVE FINDINGS: Occlusive thrombus of the left lower extremity from the popliteal vein to the external iliac vein, as suggested by preoperative duplex.  Good technical result from thrombectomy.  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  After thrombectomy, nearly all thrombus was cleared.  Improved flow was noted through the left lower extremity.  The stenosis persisted, and so I elected to stent this.  Good technical result from angioplasty and stenting on both IVUS and venogram.  DESCRIPTION OF PROCEDURE: After identification of the patient in the pre-operative holding area, the patient was transferred to the operating room. The patient was positioned supine on the operating room table. Anesthesia was induced. The groins was prepped and draped in standard fashion. A surgical pause was performed confirming correct patient, procedure, and operative location.  The skin overlaying the left popliteal vein was anesthetized with 1% lidocaine .  Micropuncture access was obtained in the left popliteal vein.  Using Seldinger technique, the micro sheath was introduced into the vein.  A Glidewire advantage was navigated into the inferior vena cava without difficulty.  Access was upsized to 8 French.  Intravascular ultrasound was performed of the inferior vena cava, left common iliac vein, left external iliac vein, left common femoral vein, left femoral vein.  Ascending venogram was performed.  Both showed occlusive  thrombus in the leg.  Patient was systemically heparinized.   Access was upsized to a 16 French dry seal.  A CAT 16 penumbra thrombectomy device was used to perform thrombectomy.  Good technical result was achieved.  Nearly all thrombus was removed.  Repeat venogram and ultrasound was performed.  Good technical result was achieved from thrombectomy.  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  I used intravascular ultrasound to measure the diameter of the healthy appearing common iliac vein.  This measured 16 mm.  I selected an 18 mm x 100 mm Abre stent and deployed this across the lesion overhang into the inferior vena cava.  I angioplastied this with a 16 mm Atlas balloon.  Repeat venogram and intravascular ultrasound was performed showing good technical result from intervention.  All endovascular equipment was removed.  A U-stitch was applied to the access site with a bolster.  Hemostasis was achieved.  The left leg was wrapped  Conscious sedation was administered with the use of IV fentanyl  and midazolam  under continuous physician and nurse monitoring.  Heart rate, blood pressure, and oxygen saturation were continuously monitored.  Total sedation time was 50 minutes  Upon completion of the case instrument and sharps counts were confirmed correct. The patient was transferred to the PACU in good condition. I was present for all portions of the procedure.  PLAN: Start heparin  infusion.  Okay to start DOAC tomorrow if access site is hemostatic.  Patient needs aspirin  and DOAC for at least 6 months.  Follow-up with me in 4-6 weeks.  Debby SAILOR. Magda, MD [facility] Phone Number: [phone] 06/26/2023 1:33 PM   ECHOCARDIOGRAM COMPLETE Result Date: 06/25/2023    ECHOCARDIOGRAM REPORT   Patient Name:   William Mcguire Date of Exam: 06/25/2023 Medical Rec #:  [id number]       Height:       75.0 in Accession #:     [id number]      Weight:       235.0 lb Date of Birth:  [birth year]       BSA:          2.350 m Patient Age:    67 years        BP:           123/74 mmHg Patient Gender: M               HR:           73 bpm. Exam Location:  Inpatient Procedure: 2D Echo, Cardiac Doppler and Color Doppler Indications:  I26.02 Pulmonary embolus  History:         Patient has no prior history of Echocardiogram examinations.                  Risk Factors:Diabetes, Hypertension and Dyslipidemia.  Sonographer:     Damien Senior RDCS Referring Phys:  [phone] JARED M GARDNER Diagnosing Phys: Ezra McleanMD IMPRESSIONS  1. Left ventricular ejection fraction, by estimation, is 60 to 65%. The left ventricle has normal function. The left ventricle has no regional wall motion abnormalities. Left ventricular diastolic parameters are consistent with Grade I diastolic dysfunction (impaired relaxation).  2. Right ventricular systolic function is normal. The right ventricular size is mildly enlarged. There is normal pulmonary artery systolic pressure. The estimated right ventricular systolic pressure is 13.9 mmHg.  3. The mitral valve is normal in structure. Trivial mitral valve regurgitation. No evidence of mitral stenosis.  4. The aortic valve is tricuspid. Aortic valve regurgitation is not visualized. No aortic stenosis is present.  5. There is mild dilatation of the ascending aorta, measuring 41 mm.  6. The inferior vena cava is normal in size with greater than 50% respiratory variability, suggesting right atrial pressure of 3 mmHg. FINDINGS  Left Ventricle: Left ventricular ejection fraction, by estimation, is 60 to 65%. The left ventricle has normal function. The left ventricle has no regional wall motion abnormalities. The left ventricular internal cavity size was normal in size. There is  borderline concentric left ventricular hypertrophy. Left ventricular diastolic parameters are consistent with Grade I diastolic dysfunction (impaired  relaxation). Right Ventricle: The right ventricular size is mildly enlarged. No increase in right ventricular wall thickness. Right ventricular systolic function is normal. There is normal pulmonary artery systolic pressure. The tricuspid regurgitant velocity is 1.65  m/s, and with an assumed right atrial pressure of 3 mmHg, the estimated right ventricular systolic pressure is 13.9 mmHg. Left Atrium: Left atrial size was normal in size. Right Atrium: Right atrial size was normal in size. Pericardium: There is no evidence of pericardial effusion. Mitral Valve: The mitral valve is normal in structure. Trivial mitral valve regurgitation. No evidence of mitral valve stenosis. Tricuspid Valve: The tricuspid valve is normal in structure. Tricuspid valve regurgitation is trivial. Aortic Valve: The aortic valve is tricuspid. Aortic valve regurgitation is not visualized. No aortic stenosis is present. Pulmonic Valve: The pulmonic valve was normal in structure. Pulmonic valve regurgitation is trivial. Aorta: The aortic root is normal in size and structure. There is mild dilatation of the ascending aorta, measuring 41 mm. Venous: The inferior vena cava is normal in size with greater than 50% respiratory variability, suggesting right atrial pressure of 3 mmHg. IAS/Shunts: The atrial septum is grossly normal.  LEFT VENTRICLE PLAX 2D LVIDd:         4.60 cm   Diastology LVIDs:         2.50 cm   LV e' medial:    6.42 cm/s LV PW:         1.10 cm   LV E/e' medial:  12.0 LV IVS:        1.00 cm   LV e' lateral:   9.68 cm/s LVOT diam:     2.10 cm   LV E/e' lateral: 7.9 LV SV:         72 LV SV Index:   31 LVOT Area:     3.46 cm  RIGHT VENTRICLE RV S prime:     12.70 cm/s LEFT ATRIUM  Index        RIGHT ATRIUM           Index LA diam:        2.70 cm 1.15 cm/m   RA Area:     16.30 cm LA Vol (A2C):   45.9 ml 19.53 ml/m  RA Volume:   40.60 ml  17.28 ml/m LA Vol (A4C):   36.1 ml 15.36 ml/m LA Biplane Vol: 45.2 ml 19.23 ml/m   AORTIC VALVE LVOT Vmax:   113.00 cm/s LVOT Vmean:  80.800 cm/s LVOT VTI:    0.209 m  AORTA Ao Root diam: 3.20 cm Ao Asc diam:  4.10 cm MITRAL VALVE               TRICUSPID VALVE MV Area (PHT): 2.66 cm    TR Peak grad:   10.9 mmHg MV Decel Time: 285 msec    TR Vmax:        165.00 cm/s MV E velocity: 76.80 cm/s MV A velocity: 93.40 cm/s  SHUNTS MV E/A ratio:  0.82        Systemic VTI:  0.21 m                            Systemic Diam: 2.10 cm Dalton McleanMD Electronically signed by Ezra Kanner Signature Date/Time: 06/25/2023/4:17:24 PM    Final (Updated)    VAS US  LOWER EXTREMITY VENOUS (DVT) (ONLY [facility]) Result Date: 06/25/2023  Lower Venous DVT Study Patient Name:  CLYDE ZARRELLA  Date of Exam:   06/24/2023 Medical Rec #: [id number]        Accession #:    [id number] Date of Birth: [birth year]        Patient Gender: M Patient Age:   26 years Exam [facility]  [facility] Procedure:      VAS US  LOWER EXTREMITY VENOUS (DVT) Referring Phys: DAN FLOYD --------------------------------------------------------------------------------  Indications: Swelling. Other Indications: Shortness of breath when using stairs. Risk Factors: Lung cancer / chemotherapy. Comparison Study: No previous exams Performing Technologist: Jody Hill RVT, RDMS  Examination Guidelines: A complete evaluation includes B-mode imaging, spectral Doppler, color Doppler, and power Doppler as needed of all accessible portions of each vessel. Bilateral testing is considered an integral part of a complete examination. Limited examinations for reoccurring indications may be performed as noted. The reflux portion of the exam is performed with the patient in reverse Trendelenburg.  +-----+---------------+---------+-----------+----------+--------------+ RIGHTCompressibilityPhasicitySpontaneityPropertiesThrombus Aging +-----+---------------+---------+-----------+----------+--------------+ CFV  Full           Yes      Yes                                  +-----+---------------+---------+-----------+----------+--------------+   +--------+---------------+---------+-----------+----------+--------------------+ LEFT    CompressibilityPhasicitySpontaneityPropertiesThrombus Aging       +--------+---------------+---------+-----------+----------+--------------------+ CFV     None                                                              +--------+---------------+---------+-----------+----------+--------------------+ SFJ     None                                                              +--------+---------------+---------+-----------+----------+--------------------+  FV Prox None           No       No                                        +--------+---------------+---------+-----------+----------+--------------------+ FV Mid  None           No       No                                        +--------+---------------+---------+-----------+----------+--------------------+ FV      None           No       No                                        Distal                                                                    +--------+---------------+---------+-----------+----------+--------------------+ PFV     None           No       No                                        +--------+---------------+---------+-----------+----------+--------------------+ POP     None           No       No                                        +--------+---------------+---------+-----------+----------+--------------------+ PTV     Full                                                              +--------+---------------+---------+-----------+----------+--------------------+ PERO    Full                                                              +--------+---------------+---------+-----------+----------+--------------------+ GSV     None           No       No                   Acute (origin /  prox)                +--------+---------------+---------+-----------+----------+--------------------+ EIV     None           No       No                   Acute - distal                                                            portion              +--------+---------------+---------+-----------+----------+--------------------+ Partial appearing thrombus in mid EIV    Summary: RIGHT: - No evidence of common femoral vein obstruction.   LEFT: - Findings consistent with acute deep vein thrombosis involving the left common femoral vein, SF junction, left femoral vein, left proximal profunda vein, left popliteal vein, and external iliac (mid and distal).  No cystic structure found in the popliteal fossa. Subcutaneous edema noted in calf and ankle.  *See table(s) above for measurements and observations. Electronically signed by Penne Colorado MD on 06/25/2023 at 8:12:00 AM.    Final    CT Angio Chest PE W and/or Wo Contrast Result Date: 06/24/2023 CLINICAL DATA:  Pulmonary embolism (PE) suspected, high prob Left lung cancer. EXAM: CT ANGIOGRAPHY CHEST WITH CONTRAST TECHNIQUE: Multidetector CT imaging of the chest was performed using the standard protocol during bolus administration of intravenous contrast. Multiplanar CT image reconstructions and MIPs were obtained to evaluate the vascular anatomy. RADIATION DOSE REDUCTION: This exam was performed according to the departmental dose-optimization program which includes automated exposure control, adjustment of the mA and/or kV according to patient size and/or use of iterative reconstruction technique. CONTRAST:  60mL OMNIPAQUE  IOHEXOL  350 MG/ML SOLN COMPARISON:  Radiograph 05/05/2023, CT 03/26/2023 FINDINGS: Cardiovascular: Multilobar acute segmental pulmonary emboli in the right lung. Thromboembolic burden is small to moderate. There is no right heart strain. The RV to LV ratio is 0.79. The heart is  normal in size. Thoracic aorta is normal in caliber. No acute aortic findings. Common origin of brachiocephalic and left common carotid artery. No pericardial effusion. Mediastinum/Nodes: Postsurgical change at the left hilum. No enlarged mediastinal or hilar lymph nodes. Thyroid  nodules are less well-defined on the current exam given phase of contrast. Patulous esophagus without wall thickening. 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 The previous hydropneumothorax has improved. Small amount of pleural fluid persists including a small loculated components in the anterior upper lobe. No definite pleural thickening/enhancement. Clustered left lung nodule series 6, image 51, are unchanged from prior exam. No evidence of pulmonary infarct or focal right lung opacity. No right pleural effusion. Upper Abdomen: No acute findings. Musculoskeletal: Diffuse thoracic spondylosis with anterior spurring. No evidence of focal bone lesion. Minimal bilateral gynecomastia, more so on the right. Review of the MIP images confirms the above findings. IMPRESSION: 1. Multilobar acute segmental pulmonary emboli in the right lung. Thromboembolic burden is small to moderate. No right heart strain. 2. Postsurgical change of left upper lobectomy. The previous hydropneumothorax has improved. Small amount of pleural fluid persists including a small loculated components in the anterior upper lobe. 3. Clustered left lung nodules are unchanged from prior exam. Recommend attention at follow-up. Critical Value/emergent results  were called by telephone at the time of interpretation on 06/24/2023 at 8:35 pm to provider [facility] , who verbally acknowledged these results. Electronically Signed   By: Andrea Gasman M.D.   On: 06/24/2023 20:37    ASSESSMENT AND PLAN: This is a very pleasant 68 years old African-American male with Stage IIA (T2b, N0, M0) non-small  cell lung cancer, adenocarcinoma. He presented with a left upper lobe lesion. He was diagnosed in September of 2024.  Molecular studies showed no actionable mutations and PD-L1 expression of 6%. He is status post right upper lobectomy with lymph node sampling under the care of Dr. Shyrl in September 2024 The patient has been undergoing adjuvant systemic chemotherapy with carboplatin  for AUC of 5 and Alimta  500 Mg/M2 every 3 weeks status post 3 cycles.  He was tolerating his treatment fairly well. He was diagnosed with left lower extremity deep venous thrombosis as well as pulmonary emboli diagnosed in January 2025.  Status post surgical thrombectomy and currently on treatment with Eliquis  twice daily.    Stage 2A Non-small cell lung cancer, adenocarcinoma in September 2024 Diagnosed with Stage 2A Non-small cell lung cancer, adenocarcinoma  in September 2024. Completed three cycles of chemotherapy with carboplatin  and pemetrexed . Currently too weak for the fourth cycle. PD-L1 marker is 6%, which may not provide significant benefit from immune therapy. Decision on immune therapy or observation to be made in four weeks. - Delay chemotherapy by one week - Reassess in four weeks to discuss potential immune therapy or observation  Pulmonary Embolism and Deep Vein Thrombosis Recent hospitalization for left leg swelling, diagnosed with extensive clots in the left leg and pulmonary embolism. Underwent clot removal surgery. Currently on Eliquis  10 mg twice daily, to be reduced to 5 mg twice daily after one week. Swelling has significantly reduced. Explained that blood clots are likely due to cancer, not chemotherapy. - Continue Eliquis  10 mg twice daily, then reduce to 5 mg twice daily after one week - Consider baby aspirin  for cardiac health  Anemia Anemia noted during recent hospitalization, received one unit of blood transfusion. Current hemoglobin level is 7.6. Plan to give one unit of PRBCs today.  -  Check labs next week - Consider additional blood transfusion if hemoglobin remains low  Follow-up - Reassess in four weeks to discuss potential immune therapy or observation - Check labs next week to monitor hemoglobin levels.   The patient was advised to call immediately if he has any other concerning symptoms in the interval. The patient voices understanding of current disease status and treatment options and is in agreement with the current care plan.  All questions were answered. The patient knows to call the clinic with any problems, questions or concerns. We can certainly see the patient much sooner if necessary.  The total time spent in the appointment was 30 minutes.  Disclaimer: This note was dictated with voice recognition software. Similar sounding words can inadvertently be transcribed and may not be corrected upon review.

## 2023-06-29 NOTE — Patient Instructions (Signed)

## 2023-06-29 NOTE — Progress Notes (Signed)
 Prepare order reentered.

## 2023-06-30 ENCOUNTER — Other Ambulatory Visit: Payer: Self-pay

## 2023-06-30 ENCOUNTER — Ambulatory Visit: Payer: Medicare Other

## 2023-06-30 ENCOUNTER — Telehealth: Payer: Self-pay

## 2023-06-30 LAB — BPAM RBC
Blood Product Expiration Date: 202501252359
ISSUE DATE / TIME: 202501061347
Unit Type and Rh: 7300

## 2023-06-30 LAB — TYPE AND SCREEN
ABO/RH(D): B POS
Antibody Screen: NEGATIVE
Unit division: 0

## 2023-06-30 NOTE — Transitions of Care (Post Inpatient/ED Visit) (Signed)
 06/30/2023  Name: William Mcguire MRN: [id number] DOB: [birth year]  Today's TOC FU Call Status: Today's TOC FU Call Status:: Successful TOC FU Call Completed TOC FU Call Complete Date: 06/30/23 Patient's Name and Date of Birth confirmed.  Transition Care Management Follow-up Telephone Call Date of Discharge: 06/28/23 Discharge Facility: [facility]) Type of Discharge: Inpatient Admission Primary Inpatient Discharge Diagnosis:: acute DVT of femoral vein of left lower extremity How have you been since you were released from the hospital?: Better (Pt voices he is doing fairly well-trying to regain strength/energy- sleeping & eating well,BM yest, no pain-hasn't taken any pain meds, he is up walking & moving aorund,cbg 141 this AM, BP 120/70) Any questions or concerns?: No  Items Reviewed: Did you receive and understand the discharge instructions provided?: Yes Medications obtained,verified, and reconciled?: Yes (Medications Reviewed) Any new allergies since your discharge?: No Dietary orders reviewed?: Yes Type of Diet Ordered:: low salt/heart healthy/carb modified Do you have support at home?: Yes People in Home: spouse Name of Support/Comfort Primary Source: Apolinar  Medications Reviewed Today: Medications Reviewed Today     Reviewed by Rochel Rama Loose, RN (Registered Nurse) on 06/30/23 at 1023  Med List Status: <None>   Medication Order Taking? Sig Documenting Provider Last Dose Status Informant  amLODipine  (NORVASC ) 10 MG tablet [id number] Yes TAKE 1 TABLET BY MOUTH EVERY DAY Jarold Medici, MD 06/30/2023 Morning Active Self, Pharmacy Records  APIXABAN  (ELIQUIS ) VTE STARTER PACK (10MG  AND 5MG ) [id number] Yes Take as directed on package: start with two-5mg  tablets twice daily for 7 days. On day 8, switch to one-5mg  tablet twice daily. Rosario Eland I, MD 06/30/2023 Morning Active   aspirin  EC 81 MG tablet [id number] Yes Take 81 mg by mouth daily. Swallow whole. [provider] 06/30/2023 Morning Active Self, Pharmacy Records  atorvastatin  (LIPITOR) 40 MG tablet [id number] Yes TAKE 1 TABLET BY MOUTH EVERY DAY Jarold Medici, MD 06/30/2023 Morning Active Self, Pharmacy Records  cyanocobalamin  (VITAMIN B12) 1000 MCG/ML injection [id number] Yes INJECT 1 ML (1,000 MCG TOTAL) INTO THE MUSCLE EVERY 30 DAYS. Jarold Medici, MD Taking Active Self, Pharmacy Records           Med Note [facility] [place], [place] A   Thu Mar 26, 2023  9:49 PM)    dexamethasone  (DECADRON ) 4 MG tablet [id number] No Take 1 tablet twice a day the day before, the day of, and the day after chemotherapy  Patient not taking: Reported on 06/30/2023   Heilingoetter, Calton CROME, PA-C Not Taking Active Self, Pharmacy Records           Med Note [place], [place] J   Tue Jun 30, 2023 10:19 AM) Pt states on hold until chemo resumes next week   Dulaglutide  (TRULICITY ) 1.5 MG/0.5ML EMMANUEL [id number]  Inject 1.5 mg into the skin every Sunday. [provider]  Active Self, Pharmacy Records           Med Note (WHITE, DONETA GORMAN [place] Jun 24, 2023 10:23 PM) On hold for procedure, expected for tomorrow  enalapril  (VASOTEC ) 20 MG tablet [id number] Yes TAKE 1 TABLET TWICE DAILY. Jarold Medici, MD 06/30/2023 Morning Active Self, Pharmacy Records  folic acid  (FOLVITE ) 1 MG tablet [id number] Yes Take 1 tablet (1 mg total) by mouth daily. Heilingoetter, Cassandra L, PA-C 06/30/2023 Morning Active Self, Pharmacy Records  glucose blood (ONETOUCH VERIO) test strip [id number] Yes Use as instructed to check blood sugars twice daily E11.69 Jarold Medici, MD 06/30/2023 Morning Active Self, Pharmacy Records  glucose blood test strip [id number]  Use as instructed to test blood sugar 3 times a day. Dx code: e11.65 Jarold Medici, MD  Active Self, Pharmacy Records  insulin  glargine (LANTUS ) 100 UNIT/ML injection [id number] Yes Inject 0.16 mLs (16 Units total) into the skin at bedtime. Rosario Eland I, MD 06/29/2023 Active   oxyCODONE   (OXY IR/ROXICODONE ) 5 MG immediate release tablet [id number]  Take 1 tablet (5 mg total) by mouth every 6 (six) hours as needed for moderate pain. Ricke Con BROCKS, PA-C  Active Self, Pharmacy Records           Med Note (WHITE, DONETA GORMAN [place] Jun 24, 2023 10:17 PM) On hand, but hasn't needed to take  sildenafil  (VIAGRA ) 100 MG tablet [id number] Yes TAKE 1 TABLET BY MOUTH EVERY DAY AS NEEDED (INSURANCE COVERS 10 TAB PER 77DAYS) Jarold Medici, MD Taking Active Self, Pharmacy Records  SYRINGE-NEEDLE, DISP, 3 ML (B-D INTEGRA SYRINGE) 25G X 5/8 3 ML MISC [id number] Yes USE AS DIRECTED Jarold Medici, MD Taking Active Self, Pharmacy Records            Home Care and Equipment/Supplies: Were Home Health Services Ordered?: NA Any new equipment or medical supplies ordered?: NA  Functional Questionnaire: Do you need assistance with bathing/showering or dressing?: No Do you need assistance with meal preparation?: No Do you need assistance with eating?: No Do you have difficulty maintaining continence: No Do you need assistance with getting out of bed/getting out of a chair/moving?: No Do you have difficulty managing or taking your medications?: No  Follow up appointments reviewed: PCP Follow-up appointment confirmed?: Yes Date of PCP follow-up appointment?: 07/09/23 Follow-up Provider: Dr. Jarold Specialist [facility] Follow-up appointment confirmed?: Yes Date of Specialist follow-up appointment?: 06/29/23 Follow-Up Specialty Provider:: Dr. Sherrod Do you need transportation to your follow-up appointment?: No Do you understand care options if your condition(s) worsen?: Yes-patient verbalized understanding  SDOH Interventions Today    Flowsheet Row Most Recent Value  SDOH Interventions   Food Insecurity Interventions Intervention Not Indicated  Housing Interventions Intervention Not Indicated  Transportation Interventions Intervention Not Indicated  Utilities Interventions Intervention Not  Indicated      Interventions Today    Flowsheet Row Most Recent Value  Chronic Disease   Chronic disease during today's visit Hypertension (HTN), Diabetes, Other  [lung CA]  General Interventions   General Interventions Discussed/Reviewed General Interventions Discussed, Doctor Visits  Doctor Visits Discussed/Reviewed Doctor Visits Discussed, PCP, Specialist  PCP/Specialist Visits Compliance with follow-up visit  Education Interventions   Education Provided Provided Education  Provided Verbal Education On Nutrition, Medication, Other  [pain mgmt, bowel mgmt]  Nutrition Interventions   Nutrition Discussed/Reviewed Nutrition Discussed, Carbohydrate meal planning, Fluid intake  Pharmacy Interventions   Pharmacy Dicussed/Reviewed Pharmacy Topics Discussed, Medications and their functions      TOC Interventions Today    Flowsheet Row Most Recent Value  TOC Interventions   TOC Interventions Discussed/Reviewed TOC Interventions Discussed       Rama Pilling, RN,BSN,CCM RN Care Manager Transitions of Care  [place]  Direct Phone: [phone] Toll Free: [phone] Fax: [phone]

## 2023-06-30 NOTE — Progress Notes (Unsigned)
 I,Brittnei Jagiello T Mcguire, CMA,acting as a neurosurgeon for William Mcguire, CMA.,have documented all relevant documentation on the behalf of William Mcguire, William Mcguire directed by  William Mcguire, CMA while in the presence of William Mcguire, William Mcguire.  Subjective:  Patient ID: William Mcguire , male    DOB: 06/11/1956 , 68 y.o.   MRN: [id number]  No chief complaint on file.   HPI  HPI   Past Medical History:  Diagnosis Date   Diabetes mellitus    High cholesterol    Hypertension      Family History  Problem Relation Age of Onset   Hypertension Mother    Multiple myeloma Mother    Hypertension Father    Diabetes Father      Current Outpatient Medications:    amLODipine  (NORVASC ) 10 MG tablet, TAKE 1 TABLET BY MOUTH EVERY DAY, Disp: 90 tablet, Rfl: 1   APIXABAN  (ELIQUIS ) VTE STARTER PACK (10MG  AND 5MG ), Take as directed on package: start with two-5mg  tablets twice daily for 7 days. On day 8, switch to one-5mg  tablet twice daily., Disp: 74 each, Rfl: 0   aspirin  EC 81 MG tablet, Take 81 mg by mouth daily. Swallow whole., Disp: , Rfl:    atorvastatin  (LIPITOR) 40 MG tablet, TAKE 1 TABLET BY MOUTH EVERY DAY, Disp: 90 tablet, Rfl: 1   cyanocobalamin  (VITAMIN B12) 1000 MCG/ML injection, INJECT 1 ML (1,000 MCG TOTAL) INTO THE MUSCLE EVERY 30 DAYS., Disp: 9 mL, Rfl: 1   dexamethasone  (DECADRON ) 4 MG tablet, Take 1 tablet twice a day the day before, the day of, and the day after chemotherapy (Patient not taking: Reported on 06/30/2023), Disp: 40 tablet, Rfl: 2   Dulaglutide  (TRULICITY ) 1.5 MG/0.5ML SOAJ, Inject 1.5 mg into the skin every Sunday., Disp: , Rfl:    enalapril  (VASOTEC ) 20 MG tablet, TAKE 1 TABLET TWICE DAILY., Disp: 90 tablet, Rfl: 1   folic acid  (FOLVITE ) 1 MG tablet, Take 1 tablet (1 mg total) by mouth daily., Disp: 30 tablet, Rfl: 2   glucose blood (ONETOUCH VERIO) test strip, Use as instructed to check blood sugars twice daily E11.69, Disp: 100 each, Rfl: 2   glucose blood  test strip, Use as instructed to test blood sugar 3 times a day. Dx code: e11.65, Disp: 100 each, Rfl: 2   insulin  glargine (LANTUS ) 100 UNIT/ML injection, Inject 0.16 mLs (16 Units total) into the skin at bedtime., Disp: 10 mL, Rfl: 11   oxyCODONE  (OXY IR/ROXICODONE ) 5 MG immediate release tablet, Take 1 tablet (5 mg total) by mouth every 6 (six) hours as needed for moderate pain., Disp: 28 tablet, Rfl: 0   sildenafil  (VIAGRA ) 100 MG tablet, TAKE 1 TABLET BY MOUTH EVERY DAY AS NEEDED (INSURANCE COVERS 10 TAB PER 77DAYS), Disp: 10 tablet, Rfl: 5   SYRINGE-NEEDLE, DISP, 3 ML (B-D INTEGRA SYRINGE) 25G X 5/8 3 ML MISC, USE AS DIRECTED, Disp: 12 each, Rfl: 24   No Known Allergies   Review of Systems   There were no vitals filed for this visit. There is no height or weight on file to calculate BMI.  Wt Readings from Last 3 Encounters:  06/29/23 236 lb (107 kg)  06/25/23 235 lb (106.6 kg)  06/23/23 235 lb (106.6 kg)     Objective:  Physical Exam      Assessment And Plan:  There are no diagnoses linked to this encounter.   No follow-ups on file.  Patient was given opportunity to ask questions. Patient verbalized  understanding of the plan and was able to repeat key elements of the plan. All questions were answered to their satisfaction.  William Mcguire, CMA  I, William Mcguire, CMA, have reviewed all documentation for this visit. The documentation on 06/30/23 for the exam, diagnosis, procedures, and orders are all accurate and complete.   IF YOU HAVE BEEN REFERRED TO A SPECIALIST, IT MAY TAKE 1-2 WEEKS TO SCHEDULE/PROCESS THE REFERRAL. IF YOU HAVE NOT HEARD FROM US /SPECIALIST IN TWO WEEKS, PLEASE GIVE US  A CALL AT [phone].   THE PATIENT IS ENCOURAGED TO PRACTICE SOCIAL DISTANCING DUE TO THE COVID-19 PANDEMIC.

## 2023-07-01 ENCOUNTER — Telehealth: Payer: Self-pay | Admitting: Internal Medicine

## 2023-07-02 NOTE — Progress Notes (Signed)
 [facility] OFFICE PROGRESS NOTE  Jarold Medici, MD [address] [place] [address]  DIAGNOSIS:  1) Stage IIA (T2b, N0, M0) non-small cell lung cancer, adenocarcinoma. He presented with a left upper lobe lesion. He was diagnosed in 2024.  2) left lower extremity deep venous thrombosis as well as pulmonary emboli diagnosed in January 2025  Molecular studies  no actionable mutations. Has KRAS G12V which is not actionable   PDL1: 6%  PRIOR THERAPY: 1) status post right upper lobectomy with lymph node sampling by Dr. Shyrl on March 18, 2023.  2) vascular mechanical thrombectomy of the inferior vena cava, left common iliac vein, left external iliac vein, left common femoral vein, left femoral vein deep venous thrombosis with left common iliac vein angioplasty stenting under the care of Dr. Magda on June 26, 2023.  CURRENT THERAPY: Adjuvant treatment with carboplatin  and Alimta  for 4 cycles. He is not a good candidate for cisplatin due to his CKD. He is status post 3 cycles. First dose on 04/13/23   INTERVAL HISTORY: Juanito Gonyer 68 y.o. male returns to the clinic today for a follow-up visit.  The patient was last seen by Dr. Sherrod on 06/29/2023.  The patient had just been discharged from the hospital the day prior to this appointment.  He was hospitalized for DVT and PE.  He is currently taking Eliquis .  He is tolerating this well without any abnormal bleeding or bruising.  His treatment was deferred by 1 week since he had just been discharged from the hospital.  He was given 1 unit of blood due to anemia.  He has some improvement in his left leg swelling although it is not back to baseline.  He is wearing compression stockings.  The patient did have some anemia even prior to starting his chemotherapy but due to the more cumulative cycles he has been requiring some more supportive care.  The patient was not able to have his routine colonoscopy  performed due to his hospitalization.  They will get in touch with GI about rescheduling this.  While the patient is feeling fairly well today.  He reports overall his energy is good but when he feels like his energy is waning, he will sit down and rest.  He denies any fever, chills, or night sweats.  He reports his appetite is good.  He reports his breathing is good.  He denies any cough, hemoptysis, or chest pain.  When he had his PE he did not have any significant chest pain except for some tightness which has since resolved.  Denies any nausea, vomiting, diarrhea, or constipation.  He does have some degree of CKD for which we have to watch his labs closely prior to administering chemotherapy.   He is here today for evaluation and repeat blood work before considering undergoing his last cycle of adjuvant treatment with cycle #4.   MEDICAL HISTORY: Past Medical History:  Diagnosis Date   Diabetes mellitus    High cholesterol    Hypertension     ALLERGIES:  has no known allergies.  MEDICATIONS:  Current Outpatient Medications  Medication Sig Dispense Refill   amLODipine  (NORVASC ) 10 MG tablet TAKE 1 TABLET BY MOUTH EVERY DAY 90 tablet 1   apixaban  (ELIQUIS ) 5 MG TABS tablet Take 1 tablet (5 mg total) by mouth 2 (two) times daily. 60 tablet 2   APIXABAN  (ELIQUIS ) VTE STARTER PACK (10MG  AND 5MG ) Take as directed on package: start with two-5mg  tablets  twice daily for 7 days. On day 8, switch to one-5mg  tablet twice daily. 74 each 0   aspirin  EC 81 MG tablet Take 81 mg by mouth daily. Swallow whole.     atorvastatin  (LIPITOR) 40 MG tablet TAKE 1 TABLET BY MOUTH EVERY DAY 90 tablet 1   cyanocobalamin  (VITAMIN B12) 1000 MCG/ML injection INJECT 1 ML (1,000 MCG TOTAL) INTO THE MUSCLE EVERY 30 DAYS. 9 mL 1   enalapril  (VASOTEC ) 20 MG tablet TAKE 1 TABLET TWICE DAILY. 90 tablet 1   folic acid  (FOLVITE ) 1 MG tablet Take 1 tablet (1 mg total) by mouth daily. 30 tablet 2   glucose blood (ONETOUCH  VERIO) test strip Use as instructed to check blood sugars twice daily E11.69 100 each 2   glucose blood test strip Use as instructed to test blood sugar 3 times a day. Dx code: e11.65 100 each 2   oxyCODONE  (OXY IR/ROXICODONE ) 5 MG immediate release tablet Take 1 tablet (5 mg total) by mouth every 6 (six) hours as needed for moderate pain. 28 tablet 0   sildenafil  (VIAGRA ) 100 MG tablet TAKE 1 TABLET BY MOUTH EVERY DAY AS NEEDED (INSURANCE COVERS 10 TAB PER 77DAYS) 10 tablet 5   SYRINGE-NEEDLE, DISP, 3 ML (B-D INTEGRA SYRINGE) 25G X 5/8 3 ML MISC USE AS DIRECTED 12 each 24   dexamethasone  (DECADRON ) 4 MG tablet Take 1 tablet twice a day the day before, the day of, and the day after chemotherapy (Patient not taking: Reported on 06/30/2023) 40 tablet 2   Dulaglutide  (TRULICITY ) 1.5 MG/0.5ML SOAJ Inject 1.5 mg into the skin every Sunday. (Patient not taking: Reported on 07/06/2023)     insulin  glargine (LANTUS ) 100 UNIT/ML injection Inject 0.16 mLs (16 Units total) into the skin at bedtime. (Patient not taking: Reported on 07/06/2023) 10 mL 11   No current facility-administered medications for this visit.    SURGICAL HISTORY:  Past Surgical History:  Procedure Laterality Date   BRONCHIAL BIOPSY  01/05/2023   Procedure: BRONCHIAL BIOPSIES;  Surgeon: Brenna Adine CROME, DO;  Location: [facility];  Service: Pulmonary;;   BRONCHIAL BRUSHINGS  01/05/2023   Procedure: BRONCHIAL BRUSHINGS;  Surgeon: Brenna Adine CROME, DO;  Location: [facility];  Service: Pulmonary;;   BRONCHIAL NEEDLE ASPIRATION BIOPSY  01/05/2023   Procedure: BRONCHIAL NEEDLE ASPIRATION BIOPSIES;  Surgeon: Brenna Adine CROME, DO;  Location: [facility];  Service: Pulmonary;;   INTERCOSTAL NERVE BLOCK Left 03/18/2023   Procedure: INTERCOSTAL NERVE BLOCK;  Surgeon: Shyrl Linnie KIDD, MD;  Location: [facility] OR;  Service: Thoracic;  Laterality: Left;   KNEE SURGERY Left 2004   torn cartilage   LYMPH NODE BIOPSY Left 03/18/2023   Procedure: LYMPH  NODE BIOPSY;  Surgeon: Shyrl Linnie KIDD, MD;  Location: [facility] OR;  Service: Thoracic;  Laterality: Left;   PATELLAR TENDON REPAIR  06/11/2011   Procedure: PATELLA TENDON REPAIR;  Surgeon: Cordella Glendia Hutchinson;  Location: [facility];  Service: Orthopedics;  Laterality: Right;   PERIPHERAL VASCULAR INTERVENTION  06/26/2023   Procedure: PERIPHERAL VASCULAR INTERVENTION;  Surgeon: Magda Debby SAILOR, MD;  Location: [facility] INVAS[facility] CV [facility];  Service: Cardiovascular;;   PERIPHERAL VASCULAR THROMBECTOMY Left 06/26/2023   Procedure: PERIPHERAL VASCULAR THROMBECTOMY;  Surgeon: Magda Debby SAILOR, MD;  Location: [facility];  Service: Cardiovascular;  Laterality: Left;   PERIPHERAL VASCULAR ULTRASOUND/IVUS  06/26/2023   Procedure: Peripheral Vascular Ultrasound/IVUS;  Surgeon: Magda Debby SAILOR, MD;  Location: [facility] INVASIVE CV LAB;  Service: Cardiovascular;;    REVIEW  OF SYSTEMS:   Constitutional: Negative for appetite change, chills, fatigue, fever and unexpected weight change.  HENT: Negative for mouth sores, nosebleeds, sore throat and trouble swallowing.   Eyes: Negative for eye problems and icterus.  Respiratory: Negative for cough, hemoptysis, shortness of breath and wheezing.   Cardiovascular: Negative for chest pain. Left leg swelling (Improved compared to prior). Gastrointestinal: Negative for abdominal pain, constipation, diarrhea, nausea and vomiting.  Genitourinary: Negative for bladder incontinence, difficulty urinating, dysuria  frequency and hematuria. Musculoskeletal: Negative for back pain, gait problem, neck pain and neck stiffness.  Skin: Negative for itching and rash.  Neurological: Negative for dizziness, extremity weakness, gait problem, headaches, light-headedness and seizures.  Hematological: Negative for adenopathy. Does not bruise/bleed easily.  Psychiatric/Behavioral: Negative for confusion, depression and sleep disturbance. The patient is not nervous/anxious.     PHYSICAL EXAMINATION:   Blood pressure 137/63, pulse 97, temperature 98.3 F (36.8 C), temperature source Temporal, resp. rate 16, weight 239 lb 9.6 oz (108.7 kg), SpO2 100%.  ECOG PERFORMANCE STATUS: 1  Physical Exam  Constitutional: Oriented to person, place, and time and well-developed, well-nourished, and in no distress.  HENT:  Head: Normocephalic and atraumatic.  Mouth/Throat: Oropharynx is clear and moist. No oropharyngeal exudate.  Eyes: Conjunctivae are normal. Right eye exhibits no discharge. Left eye exhibits no discharge. No scleral icterus.  Neck: Normal range of motion. Neck supple.  Cardiovascular: Normal rate, regular rhythm, normal heart sounds and intact distal pulses.   Pulmonary/Chest: Effort normal and breath sounds normal. No respiratory distress. No wheezes. No rales.  Abdominal: Soft. Bowel sounds are normal. Exhibits no distension and no mass. There is no tenderness.  Musculoskeletal: Normal range of motion. Left leg >R leg (improved swelling and pain compared to prior).  Lymphadenopathy:    No cervical adenopathy.  Neurological: Alert and oriented to person, place, and time. Exhibits normal muscle tone. Gait normal. Coordination normal.  Skin: Skin is warm and dry. No rash noted. Not diaphoretic. No erythema. No pallor.  Psychiatric: Mood, memory and judgment normal.  Vitals reviewed.  LABORATORY DATA: Lab Results  Component Value Date   WBC 9.2 07/06/2023   HGB 7.9 (L) 07/06/2023   HCT 23.7 (L) 07/06/2023   MCV 84.0 07/06/2023   PLT 225 07/06/2023      Chemistry      Component Value Date/Time   NA 130 (L) 07/06/2023 1402   NA 137 11/05/2022 1152   K 4.4 07/06/2023 1402   CL 101 07/06/2023 1402   CO2 25 07/06/2023 1402   BUN 13 07/06/2023 1402   BUN 11 11/05/2022 1152   CREATININE 1.32 (H) 07/06/2023 1402      Component Value Date/Time   CALCIUM  9.1 07/06/2023 1402   ALKPHOS 108 07/06/2023 1402   AST 23 07/06/2023 1402   ALT 17 07/06/2023 1402   BILITOT 0.3  07/06/2023 1402       RADIOGRAPHIC STUDIES:  PERIPHERAL VASCULAR CATHETERIZATION Result Date: 06/26/2023 DATE OF SERVICE: 06/26/2023  PATIENT:  Denece Satterfield  68 y.o. male  PRE-OPERATIVE DIAGNOSIS:  left lower extremity iliofemoral deep venous thrombosis  POST-OPERATIVE DIAGNOSIS:  Same; may-thurner syndrome  PROCEDURE:  1) Ultrasound guided left popliteal vein access 2) intravascular ultrasound of inferior vena cava, left common iliac vein, left external iliac vein, left common femoral vein, left femoral vein 3) left lower extremity and abdominal inferior vena cava venogram 4) endovascular mechanical thrombectomy of inferior vena cava, left common iliac vein, left external iliac vein, left common femoral  vein, left femoral veindeep venous thrombosis using CAT 16 penumbra device. 5) left common iliac vein angioplasty stenting (18 x 100 mm Abre) 6) Conscious sedation (50 minutes)  CPT: 23062, 37252, 37253 x 4, 75820, 75825, 37184, 37185 x 4, 37238, 99152, 00846 x 3  SURGEON:  Debby SAILOR. Magda, MD  ASSISTANT: none  ANESTHESIA:   local and IV sedation  ESTIMATED BLOOD LOSS: minimal  LOCAL MEDICATIONS USED:  LIDOCAINE   COUNTS: confirmed correct.  PATIENT DISPOSITION:  PACU - hemodynamically stable.  Delay start of Pharmacological VTE agent (>24hrs) due to surgical blood loss or risk of bleeding: no  INDICATION FOR PROCEDURE: Barnet Benavides is a 68 y.o. male with extensive left lower extremity deep venous thrombosis. After careful discussion of risks, benefits, and alternatives the patient was offered left lower extremity thrombectomy. The patient understood and wished to proceed.  OPERATIVE FINDINGS: Occlusive thrombus of the left lower extremity from the popliteal vein to the external iliac vein, as suggested by preoperative duplex.  Good technical result from thrombectomy.  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  After thrombectomy,  nearly all thrombus was cleared.  Improved flow was noted through the left lower extremity.  The stenosis persisted, and so I elected to stent this.  Good technical result from angioplasty and stenting on both IVUS and venogram.  DESCRIPTION OF PROCEDURE: After identification of the patient in the pre-operative holding area, the patient was transferred to the operating room. The patient was positioned supine on the operating room table. Anesthesia was induced. The groins was prepped and draped in standard fashion. A surgical pause was performed confirming correct patient, procedure, and operative location.  The skin overlaying the left popliteal vein was anesthetized with 1% lidocaine .  Micropuncture access was obtained in the left popliteal vein.  Using Seldinger technique, the micro sheath was introduced into the vein.  A Glidewire advantage was navigated into the inferior vena cava without difficulty.  Access was upsized to 8 French.  Intravascular ultrasound was performed of the inferior vena cava, left common iliac vein, left external iliac vein, left common femoral vein, left femoral vein.  Ascending venogram was performed.  Both showed occlusive thrombus in the leg.  Patient was systemically heparinized.   Access was upsized to a 16 French dry seal.  A CAT 16 penumbra thrombectomy device was used to perform thrombectomy.  Good technical result was achieved.  Nearly all thrombus was removed.  Repeat venogram and ultrasound was performed.  Good technical result was achieved from thrombectomy.  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  I used intravascular ultrasound to measure the diameter of the healthy appearing common iliac vein.  This measured 16 mm.  I selected an 18 mm x 100 mm Abre stent and deployed this across the lesion overhang into the inferior vena cava.  I angioplastied this with a 16 mm Atlas balloon.  Repeat venogram and intravascular  ultrasound was performed showing good technical result from intervention.  All endovascular equipment was removed.  A U-stitch was applied to the access site with a bolster.  Hemostasis was achieved.  The left leg was wrapped  Conscious sedation was administered with the use of IV fentanyl  and midazolam  under continuous physician and nurse monitoring.  Heart rate, blood pressure, and oxygen saturation were continuously monitored.  Total sedation time was 50 minutes  Upon completion of the case instrument and sharps  counts were confirmed correct. The patient was transferred to the PACU in good condition. I was present for all portions of the procedure.  PLAN: Start heparin  infusion.  Okay to start DOAC tomorrow if access site is hemostatic.  Patient needs aspirin  and DOAC for at least 6 months.  Follow-up with me in 4-6 weeks.  Debby SAILOR. Magda, MD [facility] Phone Number: [phone] 06/26/2023 1:33 PM   ECHOCARDIOGRAM COMPLETE Result Date: 06/25/2023    ECHOCARDIOGRAM REPORT   Patient Name:   SPURGEON GANCARZ Date of Exam: 06/25/2023 Medical Rec #:  [id number]       Height:       75.0 in Accession #:    [id number]      Weight:       235.0 lb Date of Birth:  Dec 19, 1955       BSA:          2.350 m Patient Age:    67 years        BP:           123/74 mmHg Patient Gender: M               HR:           73 bpm. Exam Location:  Inpatient Procedure: 2D Echo, Cardiac Doppler and Color Doppler Indications:     I26.02 Pulmonary embolus  History:         Patient has no prior history of Echocardiogram examinations.                  Risk Factors:Diabetes, Hypertension and Dyslipidemia.  Sonographer:     Damien Senior RDCS Referring Phys:  [phone] JARED M GARDNER Diagnosing Phys: Ezra McleanMD IMPRESSIONS  1. Left ventricular ejection fraction, by estimation, is 60 to 65%. The left ventricle has normal function. The left ventricle has no regional wall motion abnormalities. Left  ventricular diastolic parameters are consistent with Grade I diastolic dysfunction (impaired relaxation).  2. Right ventricular systolic function is normal. The right ventricular size is mildly enlarged. There is normal pulmonary artery systolic pressure. The estimated right ventricular systolic pressure is 13.9 mmHg.  3. The mitral valve is normal in structure. Trivial mitral valve regurgitation. No evidence of mitral stenosis.  4. The aortic valve is tricuspid. Aortic valve regurgitation is not visualized. No aortic stenosis is present.  5. There is mild dilatation of the ascending aorta, measuring 41 mm.  6. The inferior vena cava is normal in size with greater than 50% respiratory variability, suggesting right atrial pressure of 3 mmHg. FINDINGS  Left Ventricle: Left ventricular ejection fraction, by estimation, is 60 to 65%. The left ventricle has normal function. The left ventricle has no regional wall motion abnormalities. The left ventricular internal cavity size was normal in size. There is  borderline concentric left ventricular hypertrophy. Left ventricular diastolic parameters are consistent with Grade I diastolic dysfunction (impaired relaxation). Right Ventricle: The right ventricular size is mildly enlarged. No increase in right ventricular wall thickness. Right ventricular systolic function is normal. There is normal pulmonary artery systolic pressure. The tricuspid regurgitant velocity is 1.65  m/s, and with an assumed right atrial pressure of 3 mmHg, the estimated right ventricular systolic pressure is 13.9 mmHg. Left Atrium: Left atrial size was normal in size. Right Atrium: Right atrial size was normal in size. Pericardium: There is no evidence of pericardial effusion. Mitral Valve: The mitral valve is normal in structure. Trivial mitral valve  regurgitation. No evidence of mitral valve stenosis. Tricuspid Valve: The tricuspid valve is normal in structure. Tricuspid valve regurgitation is trivial.  Aortic Valve: The aortic valve is tricuspid. Aortic valve regurgitation is not visualized. No aortic stenosis is present. Pulmonic Valve: The pulmonic valve was normal in structure. Pulmonic valve regurgitation is trivial. Aorta: The aortic root is normal in size and structure. There is mild dilatation of the ascending aorta, measuring 41 mm. Venous: The inferior vena cava is normal in size with greater than 50% respiratory variability, suggesting right atrial pressure of 3 mmHg. IAS/Shunts: The atrial septum is grossly normal.  LEFT VENTRICLE PLAX 2D LVIDd:         4.60 cm   Diastology LVIDs:         2.50 cm   LV e' medial:    6.42 cm/s LV PW:         1.10 cm   LV E/e' medial:  12.0 LV IVS:        1.00 cm   LV e' lateral:   9.68 cm/s LVOT diam:     2.10 cm   LV E/e' lateral: 7.9 LV SV:         72 LV SV Index:   31 LVOT Area:     3.46 cm  RIGHT VENTRICLE RV S prime:     12.70 cm/s LEFT ATRIUM             Index        RIGHT ATRIUM           Index LA diam:        2.70 cm 1.15 cm/m   RA Area:     16.30 cm LA Vol (A2C):   45.9 ml 19.53 ml/m  RA Volume:   40.60 ml  17.28 ml/m LA Vol (A4C):   36.1 ml 15.36 ml/m LA Biplane Vol: 45.2 ml 19.23 ml/m  AORTIC VALVE LVOT Vmax:   113.00 cm/s LVOT Vmean:  80.800 cm/s LVOT VTI:    0.209 m  AORTA Ao Root diam: 3.20 cm Ao Asc diam:  4.10 cm MITRAL VALVE               TRICUSPID VALVE MV Area (PHT): 2.66 cm    TR Peak grad:   10.9 mmHg MV Decel Time: 285 msec    TR Vmax:        165.00 cm/s MV E velocity: 76.80 cm/s MV A velocity: 93.40 cm/s  SHUNTS MV E/A ratio:  0.82        Systemic VTI:  0.21 m                            Systemic Diam: 2.10 cm Dalton McleanMD Electronically signed by Ezra Kanner Signature Date/Time: 06/25/2023/4:17:24 PM    Final (Updated)    VAS US  LOWER EXTREMITY VENOUS (DVT) (ONLY [facility]) Result Date: 06/25/2023  Lower Venous DVT Study Patient Name:  DEDRICK HEFFNER  Date of Exam:   06/24/2023 Medical Rec #: [id number]        Accession #:    [id number]  Date of Birth: [birth year]        Patient Gender: M Patient Age:   14 years Exam Location:  [facility] Procedure:      VAS US  LOWER EXTREMITY VENOUS (DVT) Referring Phys: DAN FLOYD --------------------------------------------------------------------------------  Indications: Swelling. Other Indications: Shortness of breath when using stairs. Risk Factors: Lung cancer / chemotherapy.  Comparison Study: No previous exams Performing Technologist: Jody Hill RVT, RDMS  Examination Guidelines: A complete evaluation includes B-mode imaging, spectral Doppler, color Doppler, and power Doppler as needed of all accessible portions of each vessel. Bilateral testing is considered an integral part of a complete examination. Limited examinations for reoccurring indications may be performed as noted. The reflux portion of the exam is performed with the patient in reverse Trendelenburg.  +-----+---------------+---------+-----------+----------+--------------+ RIGHTCompressibilityPhasicitySpontaneityPropertiesThrombus Aging +-----+---------------+---------+-----------+----------+--------------+ CFV  Full           Yes      Yes                                 +-----+---------------+---------+-----------+----------+--------------+   +--------+---------------+---------+-----------+----------+--------------------+ LEFT    CompressibilityPhasicitySpontaneityPropertiesThrombus Aging       +--------+---------------+---------+-----------+----------+--------------------+ CFV     None                                                              +--------+---------------+---------+-----------+----------+--------------------+ SFJ     None                                                              +--------+---------------+---------+-----------+----------+--------------------+ FV Prox None           No       No                                         +--------+---------------+---------+-----------+----------+--------------------+ FV Mid  None           No       No                                        +--------+---------------+---------+-----------+----------+--------------------+ FV      None           No       No                                        Distal                                                                    +--------+---------------+---------+-----------+----------+--------------------+ PFV     None           No       No                                        +--------+---------------+---------+-----------+----------+--------------------+  POP     None           No       No                                        +--------+---------------+---------+-----------+----------+--------------------+ PTV     Full                                                              +--------+---------------+---------+-----------+----------+--------------------+ PERO    Full                                                              +--------+---------------+---------+-----------+----------+--------------------+ GSV     None           No       No                   Acute (origin /                                                           prox)                +--------+---------------+---------+-----------+----------+--------------------+ EIV     None           No       No                   Acute - distal                                                            portion              +--------+---------------+---------+-----------+----------+--------------------+ Partial appearing thrombus in mid EIV    Summary: RIGHT: - No evidence of common femoral vein obstruction.   LEFT: - Findings consistent with acute deep vein thrombosis involving the left common femoral vein, SF junction, left femoral vein, left proximal profunda vein, left popliteal vein, and external iliac (mid and distal).   No cystic structure found in the popliteal fossa. Subcutaneous edema noted in calf and ankle.  *See table(s) above for measurements and observations. Electronically signed by Penne Colorado MD on 06/25/2023 at 8:12:00 AM.    Final    CT Angio Chest PE W and/or Wo Contrast Result Date: 06/24/2023 CLINICAL DATA:  Pulmonary embolism (PE) suspected, high prob Left lung cancer. EXAM: CT ANGIOGRAPHY CHEST WITH CONTRAST TECHNIQUE: Multidetector CT imaging of the chest was performed using the standard protocol during bolus administration of intravenous contrast. Multiplanar CT image reconstructions and MIPs were obtained to evaluate the vascular anatomy. RADIATION DOSE REDUCTION: This  exam was performed according to the departmental dose-optimization program which includes automated exposure control, adjustment of the mA and/or kV according to patient size and/or use of iterative reconstruction technique. CONTRAST:  60mL OMNIPAQUE  IOHEXOL  350 MG/ML SOLN COMPARISON:  Radiograph 05/05/2023, CT 03/26/2023 FINDINGS: Cardiovascular: Multilobar acute segmental pulmonary emboli in the right lung. Thromboembolic burden is small to moderate. There is no right heart strain. The RV to LV ratio is 0.79. The heart is normal in size. Thoracic aorta is normal in caliber. No acute aortic findings. Common origin of brachiocephalic and left common carotid artery. No pericardial effusion. Mediastinum/Nodes: Postsurgical change at the left hilum. No enlarged mediastinal or hilar lymph nodes. Thyroid  nodules are less well-defined on the current exam given phase of contrast. Patulous esophagus without wall thickening. 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 The previous hydropneumothorax has improved. Small amount of pleural fluid persists including a small loculated components in the anterior upper lobe. No definite pleural thickening/enhancement. Clustered left  lung nodule series 6, image 51, are unchanged from prior exam. No evidence of pulmonary infarct or focal right lung opacity. No right pleural effusion. Upper Abdomen: No acute findings. Musculoskeletal: Diffuse thoracic spondylosis with anterior spurring. No evidence of focal bone lesion. Minimal bilateral gynecomastia, more so on the right. Review of the MIP images confirms the above findings. IMPRESSION: 1. Multilobar acute segmental pulmonary emboli in the right lung. Thromboembolic burden is small to moderate. No right heart strain. 2. Postsurgical change of left upper lobectomy. The previous hydropneumothorax has improved. Small amount of pleural fluid persists including a small loculated components in the anterior upper lobe. 3. Clustered left lung nodules are unchanged from prior exam. Recommend attention at follow-up. Critical Value/emergent results were called by telephone at the time of interpretation on 06/24/2023 at 8:35 pm to provider BROOKE SMALL , who verbally acknowledged these results. Electronically Signed   By: Andrea Gasman M.D.   On: 06/24/2023 20:37     ASSESSMENT/PLAN:  This is a very pleasant 68 year old African American Male with stage IIA (T2b, N0, M0) non-small cell lung cancer, adenocarcinoma. He presented with a left upper lobe lesion. He was diagnosed in 2024. He is status post surgical resection by Dr. Shyrl on March 18, 2023. Negative for any actionable mutations. PDL1 expression 6%.    He is currently undergoing adjuvant chemotherapy with carboplatin  for an AUC of 5 and Alimta . He is not a good canidate for cisplatin due to his CKD. We need to monitor his alimta  closely with his CKD. His first dose of treatment was on 04/13/23. He tolerated it well except for fatigue and hiccups with cycle #1.  He also had some neutropenia and required G-CSF injections with Zarxio  x 2 with cycle #1.  The patient was recently admitted for PE and DVT.  He is currently on Eliquis .  The  patient understands that this will be a continuous medication at this point in time.  I have sent his maintenance dose of Eliquis  to the pharmacy to ensure there are no lapses in his doses.  The patient understands that he does not need to pick this up at this time until he completes his starter pack of Eliquis .  I did let the patient know there is a application online for drug assistance on the Eliquis  website if there is some financial burden to this.   He had repeat labs today.  Reviewed his labs with Dr. Sherrod.  His hemoglobin is 7.9.  The patient also does have some baseline kidney dysfunction which we have to monitor closely since he is on Alimta .  His creatinine is 1.32 today.  We will administer 1 unit of blood with his chemotherapy tomorrow.  The charge nurse has adjusted the length of his chemotherapy.  The patient is aware to keep his blue bracelet on for the blood transfusion tomorrow.  I will arrange for weekly sample blood bank's and we will arrange for a blood transfusion if his hemoglobin is less than 8.   Dr. Sherrod will also reduce his dose of Alimta  400 mg/m2 due to his CKD.   Labs were reviewed.  Recommend that he proceed with the last cycle of adjuvant chemotherapy tomorrow as scheduled.  Will continue on Eliquis  given his recent PE and DVT.  Sent a refill to the pharmacy of the maintenance pack of Eliquis  for when he runs out of his starter pack.    Because the patient had a CT scan of the chest a few weeks ago when he was in the hospital, we will delay his restaging CT scan to be performed in approximately 2 months from now.  I have placed the order.  Because the patient has fluctuating kidney function, I have written a note in the order to have an i-STAT creatinine performed the day of his CT scan.  If his creatinine is greater than 1.45, recommend holding IV contrast or reducing the amount of IV contrast.   The patient will reach out to his gastroenterologist to reschedule  his colonoscopy.  Will see the patient back in follow-up visit in 2 months after his CT scan to review the results and discuss next steps.  The patient understands he is always welcome to call us  sooner if he ever has worsening symptoms of anemia needs to be assessed for future blood transfusion.  The patient was advised to call immediately if he has any concerning symptoms in the interval. The patient voices understanding of current disease status and treatment options and is in agreement with the current care plan. All questions were answered. The patient knows to call the clinic with any problems, questions or concerns. We can certainly see the patient much sooner if necessary         Orders Placed This Encounter  Procedures   CT CHEST ABDOMEN PELVIS W CONTRAST    Standing Status:   Future    Expected Date:   08/20/2023    Expiration Date:   07/05/2024    If indicated for the ordered procedure, I authorize the administration of contrast media per Radiology protocol:   Yes    Does the patient have a contrast media/X-ray dye allergy?:   No    Preferred imaging location?:   [facility]    If indicated for the ordered procedure, I authorize the administration of oral contrast media per Radiology protocol:   Yes   CMP (Cancer Center only)    Standing Status:   Standing    Number of Occurrences:   6    Expiration Date:   07/05/2024   CBC with Differential (Cancer Center Only)    Standing Status:   Standing    Number of Occurrences:   6    Expiration Date:   07/05/2024   Informed Consent Details: Physician/Practitioner Attestation; Transcribe to consent form and obtain patient signature    Standing Status:   Future    Expected Date:   07/06/2023  Expiration Date:   07/05/2024    Physician/Practitioner attestation of informed consent for blood and or blood product transfusion:   I, the physician/practitioner, attest that I have discussed with the patient the benefits, risks,  side effects, alternatives, likelihood of achieving goals and potential problems during recovery for the procedure that I have provided informed consent.    Product(s):   All Product(s)   Care order/instruction    Transfuse Parameters    Standing Status:   Future    Expiration Date:   07/05/2024   Type and screen         Standing Status:   Future    Number of Occurrences:   1    Expected Date:   07/06/2023    Expiration Date:   07/05/2024   Prepare RBC (crossmatch)    Standing Status:   Standing    Number of Occurrences:   1    # of Units:   1 unit    Transfusion Indications:   Hemoglobin 8 gm/dL or less and orthopedic or cardiac surgery or pre-existing cardiac condition    Number of Units to Keep Ahead:   NO units ahead    If emergent release call blood bank:   Not emergent release   Sample to Blood Bank    Standing Status:   Standing    Number of Occurrences:   6    Expiration Date:   07/05/2024     The total time spent in the appointment was 30-39 minutes  Alva Kuenzel L Ahnna Dungan, PA-C 07/06/23

## 2023-07-06 ENCOUNTER — Encounter: Payer: Self-pay | Admitting: Internal Medicine

## 2023-07-06 ENCOUNTER — Inpatient Hospital Stay: Payer: Medicare Other

## 2023-07-06 ENCOUNTER — Inpatient Hospital Stay (HOSPITAL_BASED_OUTPATIENT_CLINIC_OR_DEPARTMENT_OTHER): Payer: Medicare Other | Admitting: Physician Assistant

## 2023-07-06 VITALS — BP 137/63 | HR 97 | Temp 98.3°F | Resp 16 | Wt 239.6 lb

## 2023-07-06 DIAGNOSIS — C3490 Malignant neoplasm of unspecified part of unspecified bronchus or lung: Secondary | ICD-10-CM

## 2023-07-06 DIAGNOSIS — I8290 Acute embolism and thrombosis of unspecified vein: Secondary | ICD-10-CM

## 2023-07-06 DIAGNOSIS — C3492 Malignant neoplasm of unspecified part of left bronchus or lung: Secondary | ICD-10-CM

## 2023-07-06 DIAGNOSIS — Z902 Acquired absence of lung [part of]: Secondary | ICD-10-CM | POA: Diagnosis not present

## 2023-07-06 DIAGNOSIS — C3412 Malignant neoplasm of upper lobe, left bronchus or lung: Secondary | ICD-10-CM | POA: Diagnosis not present

## 2023-07-06 DIAGNOSIS — D649 Anemia, unspecified: Secondary | ICD-10-CM | POA: Diagnosis not present

## 2023-07-06 DIAGNOSIS — I2699 Other pulmonary embolism without acute cor pulmonale: Secondary | ICD-10-CM

## 2023-07-06 DIAGNOSIS — N189 Chronic kidney disease, unspecified: Secondary | ICD-10-CM | POA: Diagnosis not present

## 2023-07-06 DIAGNOSIS — I82402 Acute embolism and thrombosis of unspecified deep veins of left lower extremity: Secondary | ICD-10-CM | POA: Diagnosis not present

## 2023-07-06 DIAGNOSIS — Z5111 Encounter for antineoplastic chemotherapy: Secondary | ICD-10-CM | POA: Diagnosis not present

## 2023-07-06 DIAGNOSIS — Z7901 Long term (current) use of anticoagulants: Secondary | ICD-10-CM | POA: Diagnosis not present

## 2023-07-06 LAB — CMP (CANCER CENTER ONLY)
ALT: 17 U/L (ref 0–44)
AST: 23 U/L (ref 15–41)
Albumin: 3.1 g/dL — ABNORMAL LOW (ref 3.5–5.0)
Alkaline Phosphatase: 108 U/L (ref 38–126)
Anion gap: 4 — ABNORMAL LOW (ref 5–15)
BUN: 13 mg/dL (ref 8–23)
CO2: 25 mmol/L (ref 22–32)
Calcium: 9.1 mg/dL (ref 8.9–10.3)
Chloride: 101 mmol/L (ref 98–111)
Creatinine: 1.32 mg/dL — ABNORMAL HIGH (ref 0.61–1.24)
GFR, Estimated: 58 mL/min — ABNORMAL LOW (ref >60)
Glucose, Bld: 268 mg/dL — ABNORMAL HIGH (ref 70–99)
Potassium: 4.4 mmol/L (ref 3.5–5.1)
Sodium: 130 mmol/L — ABNORMAL LOW (ref 135–145)
Total Bilirubin: 0.3 mg/dL (ref 0.0–1.2)
Total Protein: 9 g/dL — ABNORMAL HIGH (ref 6.5–8.1)

## 2023-07-06 LAB — CBC WITH DIFFERENTIAL (CANCER CENTER ONLY)
Abs Immature Granulocytes: 0.42 10*3/uL — ABNORMAL HIGH (ref 0.00–0.07)
Basophils Absolute: 0 10*3/uL (ref 0.0–0.1)
Basophils Relative: 0 %
Eosinophils Absolute: 0 10*3/uL (ref 0.0–0.5)
Eosinophils Relative: 0 %
HCT: 23.7 % — ABNORMAL LOW (ref 39.0–52.0)
Hemoglobin: 7.9 g/dL — ABNORMAL LOW (ref 13.0–17.0)
Immature Granulocytes: 5 %
Lymphocytes Relative: 9 %
Lymphs Abs: 0.8 10*3/uL (ref 0.7–4.0)
MCH: 28 pg (ref 26.0–34.0)
MCHC: 33.3 g/dL (ref 30.0–36.0)
MCV: 84 fL (ref 80.0–100.0)
Monocytes Absolute: 0.5 10*3/uL (ref 0.1–1.0)
Monocytes Relative: 5 %
Neutro Abs: 7.5 10*3/uL (ref 1.7–7.7)
Neutrophils Relative %: 81 %
Platelet Count: 225 10*3/uL (ref 150–400)
RBC: 2.82 MIL/uL — ABNORMAL LOW (ref 4.22–5.81)
RDW: 15.2 % (ref 11.5–15.5)
WBC Count: 9.2 10*3/uL (ref 4.0–10.5)
nRBC: 0 % (ref 0.0–0.2)

## 2023-07-06 LAB — SAMPLE TO BLOOD BANK

## 2023-07-06 LAB — PREPARE RBC (CROSSMATCH)

## 2023-07-06 MED ORDER — APIXABAN 5 MG PO TABS: 5.0000 mg | ORAL_TABLET | Freq: Two times a day (BID) | ORAL | 2 refills | Status: DC

## 2023-07-06 MED FILL — Fosaprepitant Dimeglumine For IV Infusion 150 MG (Base Eq): INTRAVENOUS | Qty: 5 | Status: AC

## 2023-07-07 ENCOUNTER — Inpatient Hospital Stay: Payer: Medicare Other

## 2023-07-07 VITALS — BP 132/78 | HR 78 | Temp 98.2°F | Resp 17

## 2023-07-07 DIAGNOSIS — Z902 Acquired absence of lung [part of]: Secondary | ICD-10-CM | POA: Diagnosis not present

## 2023-07-07 DIAGNOSIS — Z7901 Long term (current) use of anticoagulants: Secondary | ICD-10-CM | POA: Diagnosis not present

## 2023-07-07 DIAGNOSIS — I2699 Other pulmonary embolism without acute cor pulmonale: Secondary | ICD-10-CM | POA: Diagnosis not present

## 2023-07-07 DIAGNOSIS — N189 Chronic kidney disease, unspecified: Secondary | ICD-10-CM | POA: Diagnosis not present

## 2023-07-07 DIAGNOSIS — Z5111 Encounter for antineoplastic chemotherapy: Secondary | ICD-10-CM | POA: Diagnosis not present

## 2023-07-07 DIAGNOSIS — D649 Anemia, unspecified: Secondary | ICD-10-CM

## 2023-07-07 DIAGNOSIS — I82402 Acute embolism and thrombosis of unspecified deep veins of left lower extremity: Secondary | ICD-10-CM | POA: Diagnosis not present

## 2023-07-07 DIAGNOSIS — C3412 Malignant neoplasm of upper lobe, left bronchus or lung: Secondary | ICD-10-CM | POA: Diagnosis not present

## 2023-07-07 DIAGNOSIS — C3492 Malignant neoplasm of unspecified part of left bronchus or lung: Secondary | ICD-10-CM

## 2023-07-07 MED ORDER — SODIUM CHLORIDE 0.9% IV SOLUTION
250.0000 mL | INTRAVENOUS | Status: DC
Start: 2023-07-07 — End: 2023-07-07
  Administered 2023-07-07: 100 mL via INTRAVENOUS

## 2023-07-07 MED ORDER — SODIUM CHLORIDE 0.9 % IV SOLN: INTRAVENOUS | Status: DC

## 2023-07-07 MED ORDER — PALONOSETRON HCL INJECTION 0.25 MG/5ML
0.2500 mg | Freq: Once | INTRAVENOUS | Status: AC
Start: 2023-07-07 — End: 2023-07-07
  Administered 2023-07-07: 0.25 mg via INTRAVENOUS
  Filled 2023-07-07: qty 5

## 2023-07-07 MED ORDER — PEMETREXED DISODIUM CHEMO INJECTION 500 MG
400.0000 mg/m2 | Freq: Once | INTRAVENOUS | Status: AC
  Administered 2023-07-07: 1000 mg via INTRAVENOUS
  Filled 2023-07-07: qty 40

## 2023-07-07 MED ORDER — SODIUM CHLORIDE 0.9 % IV SOLN
150.0000 mg | Freq: Once | INTRAVENOUS | Status: AC
  Administered 2023-07-07: 150 mg via INTRAVENOUS
  Filled 2023-07-07: qty 5
  Filled 2023-07-07: qty 150

## 2023-07-07 MED ORDER — ACETAMINOPHEN 325 MG PO TABS
650.0000 mg | ORAL_TABLET | Freq: Once | ORAL | Status: AC
  Administered 2023-07-07: 650 mg via ORAL
  Filled 2023-07-07: qty 2

## 2023-07-07 MED ORDER — SODIUM CHLORIDE 0.9 % IV SOLN
539.5000 mg | Freq: Once | INTRAVENOUS | Status: AC
  Administered 2023-07-07: 539.5 mg via INTRAVENOUS
  Filled 2023-07-07: qty 53.61

## 2023-07-07 MED ORDER — DIPHENHYDRAMINE HCL 25 MG PO CAPS
25.0000 mg | ORAL_CAPSULE | Freq: Once | ORAL | Status: AC
  Administered 2023-07-07: 25 mg via ORAL
  Filled 2023-07-07: qty 1

## 2023-07-07 MED ORDER — DEXAMETHASONE SODIUM PHOSPHATE 10 MG/ML IJ SOLN
10.0000 mg | Freq: Once | INTRAMUSCULAR | Status: AC
  Administered 2023-07-07: 10 mg via INTRAVENOUS
  Filled 2023-07-07: qty 1

## 2023-07-08 LAB — TYPE AND SCREEN
ABO/RH(D): B POS
Antibody Screen: NEGATIVE
Unit division: 0

## 2023-07-08 LAB — BPAM RBC
Blood Product Expiration Date: 202502152359
ISSUE DATE / TIME: 202501141106
Unit Type and Rh: 7300

## 2023-07-09 ENCOUNTER — Encounter: Payer: Self-pay | Admitting: Internal Medicine

## 2023-07-09 ENCOUNTER — Ambulatory Visit: Payer: Medicare Other | Admitting: Internal Medicine

## 2023-07-09 VITALS — BP 144/82 | HR 84 | Temp 98.2°F | Ht 75.0 in | Wt 241.0 lb

## 2023-07-09 DIAGNOSIS — I2694 Multiple subsegmental pulmonary emboli without acute cor pulmonale: Secondary | ICD-10-CM | POA: Diagnosis not present

## 2023-07-09 DIAGNOSIS — N182 Chronic kidney disease, stage 2 (mild): Secondary | ICD-10-CM

## 2023-07-09 DIAGNOSIS — I129 Hypertensive chronic kidney disease with stage 1 through stage 4 chronic kidney disease, or unspecified chronic kidney disease: Secondary | ICD-10-CM

## 2023-07-09 DIAGNOSIS — E66811 Obesity, class 1: Secondary | ICD-10-CM

## 2023-07-09 DIAGNOSIS — I82412 Acute embolism and thrombosis of left femoral vein: Secondary | ICD-10-CM | POA: Diagnosis not present

## 2023-07-09 DIAGNOSIS — C3492 Malignant neoplasm of unspecified part of left bronchus or lung: Secondary | ICD-10-CM

## 2023-07-09 DIAGNOSIS — E1122 Type 2 diabetes mellitus with diabetic chronic kidney disease: Secondary | ICD-10-CM

## 2023-07-09 DIAGNOSIS — Z794 Long term (current) use of insulin: Secondary | ICD-10-CM | POA: Diagnosis not present

## 2023-07-09 DIAGNOSIS — D509 Iron deficiency anemia, unspecified: Secondary | ICD-10-CM

## 2023-07-09 DIAGNOSIS — I2699 Other pulmonary embolism without acute cor pulmonale: Secondary | ICD-10-CM

## 2023-07-09 LAB — HEMOGLOBIN A1C
Est. average glucose Bld gHb Est-mCnc: 197 mg/dL
Hgb A1c MFr Bld: 8.5 % — ABNORMAL HIGH (ref 4.8–5.6)

## 2023-07-09 NOTE — Patient Instructions (Signed)
Hypertension, Adult Hypertension is another name for high blood pressure. High blood pressure forces your heart to work harder to pump blood. This can cause problems over time. There are two numbers in a blood pressure reading. There is a top number (systolic) over a bottom number (diastolic). It is best to have a blood pressure that is below 120/80. What are the causes? The cause of this condition is not known. Some other conditions can lead to high blood pressure. What increases the risk? Some lifestyle factors can make you more likely to develop high blood pressure: Smoking. Not getting enough exercise or physical activity. Being overweight. Having too much fat, sugar, calories, or salt (sodium) in your diet. Drinking too much alcohol. Other risk factors include: Having any of these conditions: Heart disease. Diabetes. High cholesterol. Kidney disease. Obstructive sleep apnea. Having a family history of high blood pressure and high cholesterol. Age. The risk increases with age. Stress. What are the signs or symptoms? High blood pressure may not cause symptoms. Very high blood pressure (hypertensive crisis) may cause: Headache. Fast or uneven heartbeats (palpitations). Shortness of breath. Nosebleed. Vomiting or feeling like you may vomit (nauseous). Changes in how you see. Very bad chest pain. Feeling dizzy. Seizures. How is this treated? This condition is treated by making healthy lifestyle changes, such as: Eating healthy foods. Exercising more. Drinking less alcohol. Your doctor may prescribe medicine if lifestyle changes do not help enough and if: Your top number is above 130. Your bottom number is above 80. Your personal target blood pressure may vary. Follow these instructions at home: Eating and drinking  If told, follow the DASH eating plan. To follow this plan: Fill one half of your plate at each meal with fruits and vegetables. Fill one fourth of your plate  at each meal with whole grains. Whole grains include whole-wheat pasta, brown rice, and whole-grain bread. Eat or drink low-fat dairy products, such as skim milk or low-fat yogurt. Fill one fourth of your plate at each meal with low-fat (lean) proteins. Low-fat proteins include fish, chicken without skin, eggs, beans, and tofu. Avoid fatty meat, cured and processed meat, or chicken with skin. Avoid pre-made or processed food. Limit the amount of salt in your diet to less than 1,500 mg each day. Do not drink alcohol if: Your doctor tells you not to drink. You are pregnant, may be pregnant, or are planning to become pregnant. If you drink alcohol: Limit how much you have to: 0-1 drink a day for women. 0-2 drinks a day for men. Know how much alcohol is in your drink. In the [place] one drink equals one 12 oz bottle of beer (355 mL), one 5 oz glass of wine (148 mL), or one 1 oz glass of hard liquor (44 mL). Lifestyle  Work with your doctor to stay at a healthy weight or to lose weight. Ask your doctor what the best weight is for you. Get at least 30 minutes of exercise that causes your heart to beat faster (aerobic exercise) most days of the week. This may include walking, swimming, or biking. Get at least 30 minutes of exercise that strengthens your muscles (resistance exercise) at least 3 days a week. This may include lifting weights or doing Pilates. Do not smoke or use any products that contain nicotine or tobacco. If you need help quitting, ask your doctor. Check your blood pressure at home as told by your doctor. Keep all follow-up visits. Medicines Take over-the-counter and prescription medicines   only as told by your doctor. Follow directions carefully. Do not skip doses of blood pressure medicine. The medicine does not work as well if you skip doses. Skipping doses also puts you at risk for problems. Ask your doctor about side effects or reactions to medicines that you should watch  for. Contact a doctor if: You think you are having a reaction to the medicine you are taking. You have headaches that keep coming back. You feel dizzy. You have swelling in your ankles. You have trouble with your vision. Get help right away if: You get a very bad headache. You start to feel mixed up (confused). You feel weak or numb. You feel faint. You have very bad pain in your: Chest. Belly (abdomen). You vomit more than once. You have trouble breathing. These symptoms may be an emergency. Get help right away. Call 911. Do not wait to see if the symptoms will go away. Do not drive yourself to the hospital. Summary Hypertension is another name for high blood pressure. High blood pressure forces your heart to work harder to pump blood. For most people, a normal blood pressure is less than 120/80. Making healthy choices can help lower blood pressure. If your blood pressure does not get lower with healthy choices, you may need to take medicine. This information is not intended to replace advice given to you by your health care provider. Make sure you discuss any questions you have with your health care provider. Document Revised: 03/28/2021 Document Reviewed: 03/28/2021 Elsevier Patient Education  2024 Elsevier Inc.  

## 2023-07-09 NOTE — Progress Notes (Signed)
I,[name], CMA,acting as a Neurosurgeon for [name], MD.,have documented all relevant documentation on the behalf of [name], MD,as directed by  [name], MD while in the presence of [name], MD.  Subjective:  Patient ID: William Mcguire , male    DOB: [birth year] , 68 y.o.   MRN: [id number]  Chief Complaint  Patient presents with   Hypertension   Diabetes    HPI  He is here today for hospital f/u. He was admitted to hospital on 06/24/23 for further evaluation of lower extremity swelling. He was found to have an extensive DVT and acute pulmonary embolism. Hospital course:  Patient was admitted and managed with heparin.  Hematology/oncology team was consulted to assist with patient's management.  Due to significant clot burden, Vascular surgery team was consulted for possible mechanical thrombectomy of left lower extremity.  Patient underwent mechanical thrombectomy and iliac vein stenting.  Heparin was continued after the procedure.  Patient was discharged home on Eliquis. He was also transfused one unit of PRBCs.  He was discharged in stable condition on 06/28/23. He states LE swelling has greatly improved.   He is also here today for diabetes/HTN f/u.  He reports compliance with meds. He denies headache, chest pain, and SOB. He denies having any specific questions or concerns. He is accompanied by his wife today.   He has DM eye exam scheduled.      Diabetes He presents for his follow-up diabetic visit. He has type 2 diabetes mellitus. His disease course has been stable. There are no hypoglycemic associated symptoms. Pertinent negatives for diabetes include no blurred vision, no chest pain, no polydipsia, no polyphagia and no polyuria. There are no hypoglycemic complications. Diabetic complications include nephropathy. Risk factors for coronary artery disease include diabetes mellitus, dyslipidemia, hypertension, male sex and sedentary lifestyle. His breakfast  blood glucose is taken between 8-9 am. His breakfast blood glucose range is generally 90-110 mg/dl.  Hypertension This is a chronic problem. The current episode started more than 1 year ago. The problem has been gradually improving since onset. The problem is controlled. Pertinent negatives include no blurred vision, chest pain, palpitations or shortness of breath. Risk factors for coronary artery disease include diabetes mellitus, dyslipidemia and male gender. Past treatments include calcium channel blockers.     Past Medical History:  Diagnosis Date   Diabetes mellitus    High cholesterol    Hypertension      Family History  Problem Relation Age of Onset   Hypertension Mother    Multiple myeloma Mother    Hypertension Father    Diabetes Father      Current Outpatient Medications:    amLODipine (NORVASC) 10 MG tablet, TAKE 1 TABLET BY MOUTH EVERY DAY, Disp: 90 tablet, Rfl: 1   apixaban (ELIQUIS) 5 MG TABS tablet, Take 1 tablet (5 mg total) by mouth 2 (two) times daily., Disp: 60 tablet, Rfl: 2   aspirin EC 81 MG tablet, Take 81 mg by mouth daily. Swallow whole., Disp: , Rfl:    atorvastatin (LIPITOR) 40 MG tablet, TAKE 1 TABLET BY MOUTH EVERY DAY, Disp: 90 tablet, Rfl: 1   cyanocobalamin (VITAMIN B12) 1000 MCG/ML injection, INJECT 1 ML (1,000 MCG TOTAL) INTO THE MUSCLE EVERY 30 DAYS., Disp: 9 mL, Rfl: 1   enalapril (VASOTEC) 20 MG tablet, TAKE 1 TABLET TWICE DAILY., Disp: 90 tablet, Rfl: 1   folic acid (FOLVITE) 1 MG tablet, Take 1 tablet (1 mg total)  by mouth daily., Disp: 30 tablet, Rfl: 2   glucose blood (ONETOUCH VERIO) test strip, Use as instructed to check blood sugars twice daily E11.69, Disp: 100 each, Rfl: 2   glucose blood test strip, Use as instructed to test blood sugar 3 times a day. Dx code: e11.65, Disp: 100 each, Rfl: 2   sildenafil (VIAGRA) 100 MG tablet, TAKE 1 TABLET BY MOUTH EVERY DAY AS NEEDED (INSURANCE COVERS 10 TAB PER 77DAYS), Disp: 10 tablet, Rfl: 5    SYRINGE-NEEDLE, DISP, 3 ML (B-D INTEGRA SYRINGE) 25G X 5/8" 3 ML MISC, USE AS DIRECTED, Disp: 12 each, Rfl: 24   B-D ULTRAFINE III SHORT PEN 31G X 8 MM MISC, Inject into the skin as directed., Disp: , Rfl:    dexamethasone (DECADRON) 4 MG tablet, Take 1 tablet twice a day the day before, the day of, and the day after chemotherapy (Patient not taking: Reported on 06/30/2023), Disp: 40 tablet, Rfl: 2   Dulaglutide (TRULICITY) 1.5 MG/0.5ML SOAJ, Inject 1.5 mg into the skin every Sunday. (Patient not taking: Reported on 07/16/2023), Disp: , Rfl:    insulin glargine (LANTUS) 100 UNIT/ML injection, Inject 0.16 mLs (16 Units total) into the skin at bedtime. (Patient taking differently: Inject 19 Units into the skin at bedtime.), Disp: 10 mL, Rfl: 11   oxyCODONE (OXY IR/ROXICODONE) 5 MG immediate release tablet, Take 1 tablet (5 mg total) by mouth every 6 (six) hours as needed for moderate pain. (Patient not taking: Reported on 07/09/2023), Disp: 28 tablet, Rfl: 0   No Known Allergies   Review of Systems  Constitutional: Negative.   HENT: Negative.    Eyes:  Negative for blurred vision.  Respiratory: Negative.  Negative for shortness of breath.   Cardiovascular: Negative.  Negative for chest pain and palpitations.  Gastrointestinal: Negative.   Endocrine: Negative for polydipsia, polyphagia and polyuria.  Skin: Negative.   Neurological: Negative.   Hematological: Negative.      Today's Vitals   07/09/23 1107 07/09/23 1147  BP: (!) 142/80 (!) 144/82  Pulse: 84   Temp: 98.2 F (36.8 C)   Weight: 241 lb (109.3 kg)   Height: 6\' 3"  (1.905 m)    Body mass index is 30.12 kg/m.  Wt Readings from Last 3 Encounters:  07/09/23 241 lb (109.3 kg)  07/06/23 239 lb 9.6 oz (108.7 kg)  06/29/23 236 lb (107 kg)    BP Readings from Last 3 Encounters:  07/09/23 (!) 144/82  07/07/23 132/78  07/06/23 137/63     Objective:  Physical Exam Vitals and nursing note reviewed.  Constitutional:       Appearance: Normal appearance.  HENT:     Head: Normocephalic and atraumatic.  Eyes:     Extraocular Movements: Extraocular movements intact.  Cardiovascular:     Rate and Rhythm: Normal rate and regular rhythm.     Heart sounds: Normal heart sounds.  Pulmonary:     Effort: Pulmonary effort is normal.     Breath sounds: Normal breath sounds.  Musculoskeletal:     Cervical back: Normal range of motion.  Skin:    General: Skin is warm.  Neurological:     General: No focal deficit present.     Mental Status: He is alert.  Psychiatric:        Mood and Affect: Mood normal.         Assessment And Plan:  Multiple subsegmental pulmonary emboli without acute cor pulmonale (HCC) Assessment & Plan: TCM PERFORMED. A MEMBER  OF THE CLINICAL TEAM SPOKE WITH THE PATIENT UPON DISCHARGE. DISCHARGE SUMMARY WAS REVIEWED IN FULL DETAIL DURING THE VISIT. MEDS RECONCILED AND COMPARED TO DISCHARGE MEDS. MEDICATION LIST WAS UPDATED AND REVIEWED WITH THE PATIENT. GREATER THAN 50% FACE TO FACE TIME WAS SPENT IN COUNSELING AND COORDINATION OF CARE. ALL QUESTIONS WERE ANSWERED TO THE SATISFACTION OF THE PATIENT.  He is encouraged to take Eliquis as directed. Will refer him to [facility] pharmacist for patient assistance. He is encouraged to monitor for rectal and/or gingival bleeding and hematuria.    Acute deep vein thrombosis (DVT) of femoral vein of left lower extremity (HCC) Assessment & Plan: He is now on Eliquis. He states it is quite expensive. Will speak to [facility] pharmacist about patient assistance. Will provide samples as well.    Type 2 diabetes mellitus with stage 2 chronic kidney disease, with long-term current use of insulin (HCC) Assessment & Plan: He will continue with Lantus 16 units nightly along with Trulicity weekly. He did have episodes of hypoglycemia while hospitalized.  If this persists, will decrease dose of insulin. He will rto in 3-4 months for re-evaluation.   Orders: -     Hemoglobin  A1c  Hypertensive nephropathy Assessment & Plan: Chronic, uncontrolled. Goal BP<120/80.  He is now on enalapril 20mg  daily. He should take this in morning and amlodipine 10mg  in the evenings (or other way around).  If persistently elevated, may need to consider increasing enalapril to BID dosing.    Iron deficiency anemia, unspecified iron deficiency anemia type Assessment & Plan: He is s/p blood transfusion during recent hospitalization.    Adenocarcinoma of left lung [facility]) Assessment & Plan: He is s/p lobectomy.  He is currently undergoing adjuvant chemotherapy with carboplatin for an AUC of 5 and Alimta. He is not a good candidate for cisplatin due to underlying CKD.    He is encouraged to strive for BMI less than 30 to decrease cardiac risk. Advised to aim for at least 150 minutes of exercise per week.    Return move phys to April please (after 4/15).  Patient was given opportunity to ask questions. Patient verbalized understanding of the plan and was able to repeat key elements of the plan. All questions were answered to their satisfaction.    I, [name], MD, have reviewed all documentation for this visit. The documentation on 07/09/23 for the exam, diagnosis, procedures, and orders are all accurate and complete.   IF YOU HAVE BEEN REFERRED TO A SPECIALIST, IT MAY TAKE 1-2 WEEKS TO SCHEDULE/PROCESS THE REFERRAL. IF YOU HAVE NOT HEARD FROM US/SPECIALIST IN TWO WEEKS, PLEASE GIVE [place] A CALL AT [phone] X [phone].   THE PATIENT IS ENCOURAGED TO PRACTICE SOCIAL DISTANCING DUE TO THE COVID-19 PANDEMIC.

## 2023-07-11 ENCOUNTER — Other Ambulatory Visit: Payer: Self-pay | Admitting: Internal Medicine

## 2023-07-12 ENCOUNTER — Other Ambulatory Visit: Payer: Self-pay | Admitting: Internal Medicine

## 2023-07-13 ENCOUNTER — Other Ambulatory Visit: Payer: Self-pay

## 2023-07-13 ENCOUNTER — Encounter: Payer: Self-pay | Admitting: Physician Assistant

## 2023-07-13 ENCOUNTER — Inpatient Hospital Stay: Payer: Medicare Other

## 2023-07-13 DIAGNOSIS — Z7901 Long term (current) use of anticoagulants: Secondary | ICD-10-CM | POA: Diagnosis not present

## 2023-07-13 DIAGNOSIS — Z902 Acquired absence of lung [part of]: Secondary | ICD-10-CM | POA: Diagnosis not present

## 2023-07-13 DIAGNOSIS — I2699 Other pulmonary embolism without acute cor pulmonale: Secondary | ICD-10-CM | POA: Diagnosis not present

## 2023-07-13 DIAGNOSIS — D649 Anemia, unspecified: Secondary | ICD-10-CM | POA: Diagnosis not present

## 2023-07-13 DIAGNOSIS — C3490 Malignant neoplasm of unspecified part of unspecified bronchus or lung: Secondary | ICD-10-CM

## 2023-07-13 DIAGNOSIS — C3492 Malignant neoplasm of unspecified part of left bronchus or lung: Secondary | ICD-10-CM

## 2023-07-13 DIAGNOSIS — C3412 Malignant neoplasm of upper lobe, left bronchus or lung: Secondary | ICD-10-CM | POA: Diagnosis not present

## 2023-07-13 DIAGNOSIS — N189 Chronic kidney disease, unspecified: Secondary | ICD-10-CM | POA: Diagnosis not present

## 2023-07-13 DIAGNOSIS — Z5111 Encounter for antineoplastic chemotherapy: Secondary | ICD-10-CM | POA: Diagnosis not present

## 2023-07-13 DIAGNOSIS — I82402 Acute embolism and thrombosis of unspecified deep veins of left lower extremity: Secondary | ICD-10-CM | POA: Diagnosis not present

## 2023-07-13 LAB — CMP (CANCER CENTER ONLY)
ALT: 13 U/L (ref 0–44)
AST: 19 U/L (ref 15–41)
Albumin: 3.1 g/dL — ABNORMAL LOW (ref 3.5–5.0)
Alkaline Phosphatase: 97 U/L (ref 38–126)
Anion gap: 3 — ABNORMAL LOW (ref 5–15)
BUN: 24 mg/dL — ABNORMAL HIGH (ref 8–23)
CO2: 27 mmol/L (ref 22–32)
Calcium: 9.1 mg/dL (ref 8.9–10.3)
Chloride: 102 mmol/L (ref 98–111)
Creatinine: 1.41 mg/dL — ABNORMAL HIGH (ref 0.61–1.24)
GFR, Estimated: 54 mL/min — ABNORMAL LOW (ref >60)
Glucose, Bld: 175 mg/dL — ABNORMAL HIGH (ref 70–99)
Potassium: 4.1 mmol/L (ref 3.5–5.1)
Sodium: 132 mmol/L — ABNORMAL LOW (ref 135–145)
Total Bilirubin: 0.5 mg/dL (ref 0.0–1.2)
Total Protein: 8.9 g/dL — ABNORMAL HIGH (ref 6.5–8.1)

## 2023-07-13 LAB — CBC WITH DIFFERENTIAL (CANCER CENTER ONLY)
Abs Immature Granulocytes: 0.02 10*3/uL (ref 0.00–0.07)
Basophils Absolute: 0 10*3/uL (ref 0.0–0.1)
Basophils Relative: 0 %
Eosinophils Absolute: 0 10*3/uL (ref 0.0–0.5)
Eosinophils Relative: 1 %
HCT: 27 % — ABNORMAL LOW (ref 39.0–52.0)
Hemoglobin: 8.7 g/dL — ABNORMAL LOW (ref 13.0–17.0)
Immature Granulocytes: 1 %
Lymphocytes Relative: 22 %
Lymphs Abs: 0.7 10*3/uL (ref 0.7–4.0)
MCH: 27.8 pg (ref 26.0–34.0)
MCHC: 32.2 g/dL (ref 30.0–36.0)
MCV: 86.3 fL (ref 80.0–100.0)
Monocytes Absolute: 0.1 10*3/uL (ref 0.1–1.0)
Monocytes Relative: 4 %
Neutro Abs: 2.1 10*3/uL (ref 1.7–7.7)
Neutrophils Relative %: 72 %
Platelet Count: 157 10*3/uL (ref 150–400)
RBC: 3.13 MIL/uL — ABNORMAL LOW (ref 4.22–5.81)
RDW: 15.4 % (ref 11.5–15.5)
WBC Count: 2.9 10*3/uL — ABNORMAL LOW (ref 4.0–10.5)
nRBC: 0 % (ref 0.0–0.2)

## 2023-07-13 LAB — SAMPLE TO BLOOD BANK

## 2023-07-15 ENCOUNTER — Ambulatory Visit: Payer: Medicare Other | Admitting: Internal Medicine

## 2023-07-16 ENCOUNTER — Other Ambulatory Visit: Payer: Self-pay | Admitting: Pharmacist

## 2023-07-16 DIAGNOSIS — Z5986 Financial insecurity: Secondary | ICD-10-CM

## 2023-07-16 NOTE — Progress Notes (Addendum)
07/16/2023  Patient ID: William Mcguire, male   DOB: May 08, 1956, 68 y.o.   MRN: [id number]  [facility]) Care Management  [facility]   07/16/2023  William Mcguire 06/04/1956 [id number]  Reason for referral: Medication Assistance  Referral source: Provider Referral medication(s): Eliqus Current insurance:BCBS  Objective: No Known Allergies  Medications Reviewed Today     Reviewed by Beecher Mcardle, [facility] (Pharmacist) on 07/16/23 at 1525  Med List Status: <None>   Medication Order Taking? Sig Documenting Provider Last Dose Status Informant  amLODipine (NORVASC) 10 MG tablet [id number] Yes TAKE 1 TABLET BY MOUTH EVERY DAY William Peng, MD Taking Active Self, Pharmacy Records  apixaban (ELIQUIS) 5 MG TABS tablet [id number] Yes Take 1 tablet (5 mg total) by mouth 2 (two) times daily. William, Cassandra L, PA-C Taking Active   APIXABAN (ELIQUIS) VTE STARTER PACK (10MG  AND 5MG ) [id number]  Take as directed on package: start with two-5mg  tablets twice daily for 7 days. On day 8, switch to one-5mg  tablet twice daily. William Mount I, MD  Active   aspirin EC 81 MG tablet [id number] Yes Take 81 mg by mouth daily. Swallow whole. [provider] Taking Active Self, Pharmacy Records  atorvastatin (LIPITOR) 40 MG tablet [id number] Yes TAKE 1 TABLET BY MOUTH EVERY DAY William Peng, MD Taking Active Self, Pharmacy Records  B-D ULTRAFINE III SHORT PEN 31G X 8 MM MISC [id number] Yes Inject into the skin as directed. [provider] Taking Active   cyanocobalamin (VITAMIN B12) 1000 MCG/ML injection [id number] Yes INJECT 1 ML (1,000 MCG TOTAL) INTO THE MUSCLE EVERY 30 DAYS. William Peng, MD Taking Active Self, Pharmacy Records           Med Note [facility] [place], [place] A   Thu Mar 26, 2023  9:49 PM)    dexamethasone (DECADRON) 4 MG tablet [id number] No Take 1 tablet twice a day the day before, the day of, and the day after chemotherapy  Patient not  taking: Reported on 06/30/2023   William, William Abraham, PA-C Not Taking Active Self, Pharmacy Records           Med Note [place], [place] J   Tue Jun 30, 2023 10:19 AM) Pt states on hold until chemo resumes next week   Dulaglutide (TRULICITY) 1.5 MG/0.5ML SOAJ [id number] No Inject 1.5 mg into the skin every Sunday.  Patient not taking: Reported on 07/16/2023   [provider] Not Taking Active Self, Pharmacy Records           Med Note (WHITE, William Mcguire Jun 24, 2023 10:23 PM) On hold for procedure, expected for tomorrow  enalapril (VASOTEC) 20 MG tablet [id number] Yes TAKE 1 TABLET TWICE DAILY. William Peng, MD Taking Active Self, Pharmacy Records  folic acid (FOLVITE) 1 MG tablet [id number] Yes Take 1 tablet (1 mg total) by mouth daily. William, Cassandra L, PA-C Taking Active Self, Pharmacy Records  glucose blood (ONETOUCH VERIO) test strip [id number] Yes Use as instructed to check blood sugars twice daily E11.69 William Peng, MD Taking Active Self, Pharmacy Records  glucose blood test strip [id number] Yes Use as instructed to test blood sugar 3 times a day. Dx code: e11.65 William Peng, MD Taking Active Self, Pharmacy Records  insulin glargine (LANTUS) 100 UNIT/ML injection [id number] Yes Inject 0.16 mLs (16 Units total) into the skin at bedtime.  Patient taking differently: Inject 19 Units into the skin at bedtime.   William Chapel, MD Taking Active  oxyCODONE (OXY IR/ROXICODONE) 5 MG immediate release tablet [id number] No Take 1 tablet (5 mg total) by mouth every 6 (six) hours as needed for moderate pain.  Patient not taking: Reported on 07/09/2023   William Reichmann, PA-C Not Taking Active Self, Pharmacy Records           Med Note [facility], William Mcguire Jun 24, 2023 10:17 PM) On hand, but hasn't needed to take  sildenafil (VIAGRA) 100 MG tablet [id number] Yes TAKE 1 TABLET BY MOUTH EVERY DAY AS NEEDED (INSURANCE COVERS 10 TAB PER 77DAYS) William Peng, MD Taking  Active Self, Pharmacy Records  SYRINGE-NEEDLE, DISP, 3 ML (B-D INTEGRA SYRINGE) 25G X 5/8" 3 ML MISC [id number] Yes USE AS DIRECTED William Peng, MD Taking Active Self, Pharmacy Records            Assessment:  Medication Assistance Findings:  Medication assistance needs identified: Eliquis Lantus & possibly Trulicity. Reviewed Patient's medications.  Eliquis is available through  [employer] Program however, patients are required to spend 3 % of his income on medication expenses to quality.  He also shared that Trulicity is currently being held. Trulicity and Lantus are available through [employer]. Since He has not spent enough to qualify for Eliquis, an application will be sent for Lantus and Trulicity with an understanding that if Trulicity is not restarted, it will not be requested.  According to the pharmacy, he paid $51 for a 62 day supply of Lantus.  The current Eliquis cost is $422.  Patient was encouraged to go ahead and purchase the Eliquis as He has a $375 deductible. After the deductible is met Tier 3 medications are $45  Preferred generic- $0 Generic $6 Preferred Brand-$45 Non-Preferred Brand-49% coinsurance Specialty Tier 35% [facility] $0  Once the Patient has spent 3% of his income, an application will be sent to him for Eliquis.    He could be switched to [employer] instead of Lantus as Hospital doctor and Trulicity are available through [employer]. Will send note to PCP.   Additional medication assistance options reviewed with patient as warranted:  No other options identified  Plan: Mcguire will route patient assistance letter to [facility] pharmacy technician who will coordinate patient assistance program application process for medications listed above.  [facility] pharmacy technician will assist with obtaining all required documents from both patient and provider(s) and submit application(s) once completed.     Beecher Mcardle, PharmD, BCACP [facility] Clinical  Pharmacist [phone]

## 2023-07-17 ENCOUNTER — Telehealth: Payer: Self-pay | Admitting: Physician Assistant

## 2023-07-17 NOTE — Telephone Encounter (Signed)
I called the patient to follow up on his my chart message. He tells me he has been having some mild gingival bleeding but it is getting better. He denies any hemoptysis, melena, or hematemesis. He denies overt epistaxis but did have episode of mild blood tinged mucus once with blowing his nose. We discussed avoiding forceful blowing/using saline spray. He is on eliquis due to recent DVT which he will need to continue. He is taking baby aspirin as well for cardiac risk reduction. He was on this prior to the hospitalization. I will review hospital notes to see if he should be taking both aspirin and eliquis if he continues to have gingival bleeding. I will also discuss with Dr. Arbutus Ped next week. The patient knows should he develop significant/overt bleeding to seek ER evaluation.

## 2023-07-19 NOTE — Assessment & Plan Note (Signed)
He is s/p blood transfusion during recent hospitalization.

## 2023-07-19 NOTE — Assessment & Plan Note (Signed)
TCM PERFORMED. A MEMBER OF THE CLINICAL TEAM SPOKE WITH THE PATIENT UPON DISCHARGE. DISCHARGE SUMMARY WAS REVIEWED IN FULL DETAIL DURING THE VISIT. MEDS RECONCILED AND COMPARED TO DISCHARGE MEDS. MEDICATION LIST WAS UPDATED AND REVIEWED WITH THE PATIENT. GREATER THAN 50% FACE TO FACE TIME WAS SPENT IN COUNSELING AND COORDINATION OF CARE. ALL QUESTIONS WERE ANSWERED TO THE SATISFACTION OF THE PATIENT.  He is encouraged to take Eliquis as directed. Will refer him to [facility] pharmacist for patient assistance. He is encouraged to monitor for rectal and/or gingival bleeding and hematuria.

## 2023-07-19 NOTE — Assessment & Plan Note (Signed)
He is now on Eliquis. He states it is quite expensive. Will speak to CCM pharmacist about patient assistance. Will provide samples as well.

## 2023-07-19 NOTE — Assessment & Plan Note (Signed)
He will continue with Lantus 16 units nightly along with Trulicity weekly. He did have episodes of hypoglycemia while hospitalized.  If this persists, will decrease dose of insulin. He will rto in 3-4 months for re-evaluation.

## 2023-07-19 NOTE — Assessment & Plan Note (Signed)
Chronic, uncontrolled. Goal BP<120/80.  He is now on enalapril 20mg  daily. He should take this in morning and amlodipine 10mg  in the evenings (or other way around).  If persistently elevated, may need to consider increasing enalapril to BID dosing.

## 2023-07-19 NOTE — Assessment & Plan Note (Addendum)
He is s/p lobectomy.  He is currently undergoing adjuvant chemotherapy with carboplatin for an AUC of 5 and Alimta. He is not a good candidate for cisplatin due to underlying CKD.

## 2023-07-20 ENCOUNTER — Telehealth: Payer: Self-pay

## 2023-07-20 ENCOUNTER — Other Ambulatory Visit: Payer: Self-pay | Admitting: Physician Assistant

## 2023-07-20 ENCOUNTER — Inpatient Hospital Stay: Payer: Medicare Other

## 2023-07-20 DIAGNOSIS — I82402 Acute embolism and thrombosis of unspecified deep veins of left lower extremity: Secondary | ICD-10-CM | POA: Diagnosis not present

## 2023-07-20 DIAGNOSIS — I2699 Other pulmonary embolism without acute cor pulmonale: Secondary | ICD-10-CM | POA: Diagnosis not present

## 2023-07-20 DIAGNOSIS — C3490 Malignant neoplasm of unspecified part of unspecified bronchus or lung: Secondary | ICD-10-CM

## 2023-07-20 DIAGNOSIS — Z902 Acquired absence of lung [part of]: Secondary | ICD-10-CM | POA: Diagnosis not present

## 2023-07-20 DIAGNOSIS — C3412 Malignant neoplasm of upper lobe, left bronchus or lung: Secondary | ICD-10-CM | POA: Diagnosis not present

## 2023-07-20 DIAGNOSIS — Z5111 Encounter for antineoplastic chemotherapy: Secondary | ICD-10-CM | POA: Diagnosis not present

## 2023-07-20 DIAGNOSIS — N189 Chronic kidney disease, unspecified: Secondary | ICD-10-CM | POA: Diagnosis not present

## 2023-07-20 DIAGNOSIS — D649 Anemia, unspecified: Secondary | ICD-10-CM | POA: Diagnosis not present

## 2023-07-20 DIAGNOSIS — D6481 Anemia due to antineoplastic chemotherapy: Secondary | ICD-10-CM

## 2023-07-20 DIAGNOSIS — Z7901 Long term (current) use of anticoagulants: Secondary | ICD-10-CM | POA: Diagnosis not present

## 2023-07-20 LAB — CBC WITH DIFFERENTIAL (CANCER CENTER ONLY)
Abs Immature Granulocytes: 0.01 10*3/uL (ref 0.00–0.07)
Basophils Absolute: 0 10*3/uL (ref 0.0–0.1)
Basophils Relative: 0 %
Eosinophils Absolute: 0 10*3/uL (ref 0.0–0.5)
Eosinophils Relative: 1 %
HCT: 22.7 % — ABNORMAL LOW (ref 39.0–52.0)
Hemoglobin: 7.5 g/dL — ABNORMAL LOW (ref 13.0–17.0)
Immature Granulocytes: 0 %
Lymphocytes Relative: 37 %
Lymphs Abs: 1.1 10*3/uL (ref 0.7–4.0)
MCH: 28.5 pg (ref 26.0–34.0)
MCHC: 33 g/dL (ref 30.0–36.0)
MCV: 86.3 fL (ref 80.0–100.0)
Monocytes Absolute: 0.4 10*3/uL (ref 0.1–1.0)
Monocytes Relative: 14 %
Neutro Abs: 1.5 10*3/uL — ABNORMAL LOW (ref 1.7–7.7)
Neutrophils Relative %: 48 %
Platelet Count: 55 10*3/uL — ABNORMAL LOW (ref 150–400)
RBC: 2.63 MIL/uL — ABNORMAL LOW (ref 4.22–5.81)
RDW: 15.3 % (ref 11.5–15.5)
WBC Count: 3 10*3/uL — ABNORMAL LOW (ref 4.0–10.5)
nRBC: 0 % (ref 0.0–0.2)

## 2023-07-20 LAB — CMP (CANCER CENTER ONLY)
ALT: 18 U/L (ref 0–44)
AST: 25 U/L (ref 15–41)
Albumin: 3.1 g/dL — ABNORMAL LOW (ref 3.5–5.0)
Alkaline Phosphatase: 102 U/L (ref 38–126)
Anion gap: 3 — ABNORMAL LOW (ref 5–15)
BUN: 15 mg/dL (ref 8–23)
CO2: 26 mmol/L (ref 22–32)
Calcium: 9.4 mg/dL (ref 8.9–10.3)
Chloride: 103 mmol/L (ref 98–111)
Creatinine: 1.46 mg/dL — ABNORMAL HIGH (ref 0.61–1.24)
GFR, Estimated: 52 mL/min — ABNORMAL LOW (ref >60)
Glucose, Bld: 192 mg/dL — ABNORMAL HIGH (ref 70–99)
Potassium: 4.6 mmol/L (ref 3.5–5.1)
Sodium: 132 mmol/L — ABNORMAL LOW (ref 135–145)
Total Bilirubin: 0.4 mg/dL (ref 0.0–1.2)
Total Protein: 8.9 g/dL — ABNORMAL HIGH (ref 6.5–8.1)

## 2023-07-20 LAB — PREPARE RBC (CROSSMATCH)

## 2023-07-20 LAB — SAMPLE TO BLOOD BANK

## 2023-07-20 NOTE — Telephone Encounter (Signed)
Spoke with patient in regards to lab results.  Per Cassie, PA- 2 units of blood this week.  Patient is scheduled for 2 units tomorrow.  Patient verbalized understanding and reminded to bring blue arm band.

## 2023-07-20 NOTE — Progress Notes (Signed)
CRITICAL VALUE STICKER  CRITICAL VALUE: hgb 7.5  RECEIVER (on-site recipient of call):  [facility]   DATE & TIME NOTIFIED: 1/27 @ 1330  MESSENGER (representative from lab): Mindi Junker   MD NOTIFIED: Dr. Shella Spearing  TIME OF NOTIFICATION: 1/27 @ 1336  RESPONSE: MD/Cassie notified

## 2023-07-21 ENCOUNTER — Ambulatory Visit: Payer: Medicare Other | Admitting: Internal Medicine

## 2023-07-21 ENCOUNTER — Inpatient Hospital Stay: Payer: Medicare Other

## 2023-07-21 DIAGNOSIS — C3412 Malignant neoplasm of upper lobe, left bronchus or lung: Secondary | ICD-10-CM | POA: Diagnosis not present

## 2023-07-21 DIAGNOSIS — I82402 Acute embolism and thrombosis of unspecified deep veins of left lower extremity: Secondary | ICD-10-CM | POA: Diagnosis not present

## 2023-07-21 DIAGNOSIS — N189 Chronic kidney disease, unspecified: Secondary | ICD-10-CM | POA: Diagnosis not present

## 2023-07-21 DIAGNOSIS — D6481 Anemia due to antineoplastic chemotherapy: Secondary | ICD-10-CM

## 2023-07-21 DIAGNOSIS — Z902 Acquired absence of lung [part of]: Secondary | ICD-10-CM | POA: Diagnosis not present

## 2023-07-21 DIAGNOSIS — D649 Anemia, unspecified: Secondary | ICD-10-CM | POA: Diagnosis not present

## 2023-07-21 DIAGNOSIS — Z7901 Long term (current) use of anticoagulants: Secondary | ICD-10-CM | POA: Diagnosis not present

## 2023-07-21 DIAGNOSIS — Z5111 Encounter for antineoplastic chemotherapy: Secondary | ICD-10-CM | POA: Diagnosis not present

## 2023-07-21 DIAGNOSIS — I2699 Other pulmonary embolism without acute cor pulmonale: Secondary | ICD-10-CM | POA: Diagnosis not present

## 2023-07-21 MED ORDER — DIPHENHYDRAMINE HCL 25 MG PO CAPS
25.0000 mg | ORAL_CAPSULE | Freq: Once | ORAL | Status: AC
  Administered 2023-07-21: 25 mg via ORAL
  Filled 2023-07-21: qty 1

## 2023-07-21 MED ORDER — ACETAMINOPHEN 325 MG PO TABS
650.0000 mg | ORAL_TABLET | Freq: Once | ORAL | Status: AC
  Administered 2023-07-21: 650 mg via ORAL
  Filled 2023-07-21: qty 2

## 2023-07-21 MED ORDER — SODIUM CHLORIDE 0.9% IV SOLUTION
250.0000 mL | INTRAVENOUS | Status: DC
  Administered 2023-07-21: 100 mL via INTRAVENOUS

## 2023-07-21 NOTE — Patient Instructions (Signed)

## 2023-07-22 ENCOUNTER — Telehealth: Payer: Self-pay

## 2023-07-22 LAB — TYPE AND SCREEN
ABO/RH(D): B POS
Antibody Screen: NEGATIVE
Unit division: 0
Unit division: 0

## 2023-07-22 LAB — BPAM RBC
Blood Product Expiration Date: 202502172359
Blood Product Unit Number: 202502172359
ISSUE DATE / TIME: 202501281019
PRODUCT CODE: 202501281019
PRODUCT CODE: 202502172359
Unit Type and Rh: 7300
Unit Type and Rh: 202502172359
Unit Type and Rh: 7300
Unit Type and Rh: 7300

## 2023-07-22 NOTE — Telephone Encounter (Signed)
PAP: Patient assistance application for [name] and Trulicity through [employer] has been mailed to pt's home address on file. Provider portion of application will be faxed to provider's office.  Please note I have faxed the provider form of the application to Dr. [name]

## 2023-07-27 ENCOUNTER — Ambulatory Visit: Payer: Medicare Other | Admitting: Internal Medicine

## 2023-07-27 ENCOUNTER — Telehealth: Payer: Self-pay | Admitting: Pharmacist

## 2023-07-27 ENCOUNTER — Telehealth: Payer: Self-pay | Admitting: *Deleted

## 2023-07-27 ENCOUNTER — Inpatient Hospital Stay: Payer: Medicare Other | Attending: Hematology and Oncology

## 2023-07-27 ENCOUNTER — Other Ambulatory Visit: Payer: Medicare Other

## 2023-07-27 DIAGNOSIS — C3411 Malignant neoplasm of upper lobe, right bronchus or lung: Secondary | ICD-10-CM | POA: Diagnosis not present

## 2023-07-27 DIAGNOSIS — N189 Chronic kidney disease, unspecified: Secondary | ICD-10-CM | POA: Diagnosis not present

## 2023-07-27 DIAGNOSIS — I82412 Acute embolism and thrombosis of left femoral vein: Secondary | ICD-10-CM | POA: Diagnosis not present

## 2023-07-27 DIAGNOSIS — E1122 Type 2 diabetes mellitus with diabetic chronic kidney disease: Secondary | ICD-10-CM

## 2023-07-27 DIAGNOSIS — C3490 Malignant neoplasm of unspecified part of unspecified bronchus or lung: Secondary | ICD-10-CM

## 2023-07-27 DIAGNOSIS — I2699 Other pulmonary embolism without acute cor pulmonale: Secondary | ICD-10-CM | POA: Insufficient documentation

## 2023-07-27 LAB — CMP (CANCER CENTER ONLY)
ALT: 19 U/L (ref 0–44)
AST: 26 U/L (ref 15–41)
Albumin: 3.1 g/dL — ABNORMAL LOW (ref 3.5–5.0)
Alkaline Phosphatase: 123 U/L (ref 38–126)
Anion gap: 4 — ABNORMAL LOW (ref 5–15)
BUN: 12 mg/dL (ref 8–23)
CO2: 27 mmol/L (ref 22–32)
Calcium: 9.1 mg/dL (ref 8.9–10.3)
Chloride: 101 mmol/L (ref 98–111)
Creatinine: 1.31 mg/dL — ABNORMAL HIGH (ref 0.61–1.24)
GFR, Estimated: 59 mL/min — ABNORMAL LOW (ref >60)
Glucose, Bld: 230 mg/dL — ABNORMAL HIGH (ref 70–99)
Potassium: 4.1 mmol/L (ref 3.5–5.1)
Sodium: 132 mmol/L — ABNORMAL LOW (ref 135–145)
Total Bilirubin: 0.3 mg/dL (ref 0.0–1.2)
Total Protein: 9 g/dL — ABNORMAL HIGH (ref 6.5–8.1)

## 2023-07-27 LAB — CBC WITH DIFFERENTIAL (CANCER CENTER ONLY)
Abs Immature Granulocytes: 0.22 10*3/uL — ABNORMAL HIGH (ref 0.00–0.07)
Basophils Absolute: 0 10*3/uL (ref 0.0–0.1)
Basophils Relative: 1 %
Eosinophils Absolute: 0 10*3/uL (ref 0.0–0.5)
Eosinophils Relative: 1 %
HCT: 29.3 % — ABNORMAL LOW (ref 39.0–52.0)
Hemoglobin: 9.7 g/dL — ABNORMAL LOW (ref 13.0–17.0)
Immature Granulocytes: 5 %
Lymphocytes Relative: 37 %
Lymphs Abs: 1.6 10*3/uL (ref 0.7–4.0)
MCH: 29 pg (ref 26.0–34.0)
MCHC: 33.1 g/dL (ref 30.0–36.0)
MCV: 87.7 fL (ref 80.0–100.0)
Monocytes Absolute: 1 10*3/uL (ref 0.1–1.0)
Monocytes Relative: 22 %
Neutro Abs: 1.4 10*3/uL — ABNORMAL LOW (ref 1.7–7.7)
Neutrophils Relative %: 34 %
Platelet Count: 175 10*3/uL (ref 150–400)
RBC: 3.34 MIL/uL — ABNORMAL LOW (ref 4.22–5.81)
RDW: 15.2 % (ref 11.5–15.5)
WBC Count: 4.3 10*3/uL (ref 4.0–10.5)
nRBC: 0 % (ref 0.0–0.2)

## 2023-07-27 LAB — SAMPLE TO BLOOD BANK

## 2023-07-27 NOTE — Progress Notes (Signed)
   07/27/2023  Patient ID: [name], male   DOB: 21-Mar-1956, 68 y.o.   MRN: [id number]  Spoke to Patient to let him know Dr. Allyne Gee approved Lantus to be switched to [facility] since Mariella Saa and Trulicity are available through [employer] Patient's assistance program.  Patient communicated understanding.  Med Assistance Team has already sent applications to the patient and will follow the process.  Beecher Mcardle, PharmD, BCACP Clinical Pharmacist [phone]

## 2023-07-27 NOTE — Telephone Encounter (Signed)
-----   Message from [name] sent at 07/25/2023  2:41 PM EST ----- I have no problem with the change.  Thx ----- Message ----- From: Beecher Mcardle, [facility] Sent: 07/21/2023   1:08 PM EST To: [name], MD  Good afternoon!  Are you ok with switching Hospital doctor for Lantus as Hospital doctor and Trulicity are both available through [employer].  Thanks, Beecher Mcardle, PharmD, BCACP Clinical Pharmacist [phone]

## 2023-07-27 NOTE — Telephone Encounter (Signed)
Patient's wife called to report patient lost his balance in the bathroom and fell. He hit his head on the tub. No visible signs of injury. Patients wife strongly advised to go to the ED for evaluation. Wife states patient would refuse and asked why this office could not just order the scan needed, he is coming in anyway for labs today. This RN explained that patient needed STAT scan with reading and immediate interventions if necessary. The scan ordered by this office would not take precedence over ED scans. A head injury needs to be treated in the emergency department. Wife became tearful, yelling "you are his primary caregivers, you should be taking care of him and now what's he gonna do just die?" This RN again advised wife please try to get him to go to the ED this office is not able to provide the emergency care needed for a head injury. Call was terminated.

## 2023-07-28 ENCOUNTER — Ambulatory Visit (INDEPENDENT_AMBULATORY_CARE_PROVIDER_SITE_OTHER): Payer: Medicare Other | Admitting: Physician Assistant

## 2023-07-28 VITALS — BP 151/82 | HR 71 | Temp 98.1°F | Ht 75.0 in | Wt 238.0 lb

## 2023-07-28 DIAGNOSIS — I82422 Acute embolism and thrombosis of left iliac vein: Secondary | ICD-10-CM | POA: Diagnosis not present

## 2023-07-28 NOTE — Progress Notes (Signed)
 Office Note   History of Present Illness   William Mcguire is a 68 y.o. ([birth year]) male who presents for follow up. He recently underwent left lower extremity and abdominal IVC venogram with mechanical thrombectomy of the IVC, left common iliac vein, left external iliac vein, left common femoral vein, and left femoral vein, and left common iliac vein angioplasty and stenting on 06/26/2023 by Dr. Magda.  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  Postoperatively he was to be on aspirin  and a DOAC for 6 months.  He returns today for follow-up.  He states his left leg swelling has greatly improved since his procedure.  His leg is only slightly more swollen than the right.  He wears his knee-high compression stockings almost daily, which helps with his leg swelling.  When at rest, he will occasionally elevate his legs.   He says within 2 days of starting his Eliquis  and aspirin , he experienced penile bleeding. Therefore he stopped his aspirin  and has only been taking the Eliquis . He has not had any more bleeding issues.    Past Medical History:  Diagnosis Date   Diabetes mellitus    High cholesterol    Hypertension     Past Surgical History:  Procedure Laterality Date   BRONCHIAL BIOPSY  01/05/2023   Procedure: BRONCHIAL BIOPSIES;  Surgeon: Brenna Adine CROME, DO;  Location: [facility];  Service: Pulmonary;;   BRONCHIAL BRUSHINGS  01/05/2023   Procedure: BRONCHIAL BRUSHINGS;  Surgeon: Brenna Adine CROME, DO;  Location: [facility];  Service: Pulmonary;;   BRONCHIAL NEEDLE ASPIRATION BIOPSY  01/05/2023   Procedure: BRONCHIAL NEEDLE ASPIRATION BIOPSIES;  Surgeon: Brenna Adine CROME, DO;  Location: [facility];  Service: Pulmonary;;   INTERCOSTAL NERVE BLOCK Left 03/18/2023   Procedure: INTERCOSTAL NERVE BLOCK;  Surgeon: Shyrl Linnie KIDD, MD;  Location: [facility] OR;  Service: Thoracic;  Laterality: Left;   KNEE SURGERY Left 2004   torn cartilage    LYMPH NODE BIOPSY Left 03/18/2023   Procedure: LYMPH NODE BIOPSY;  Surgeon: Shyrl Linnie KIDD, MD;  Location: [facility] OR;  Service: Thoracic;  Laterality: Left;   PATELLAR TENDON REPAIR  06/11/2011   Procedure: PATELLA TENDON REPAIR;  Surgeon: Cordella Glendia Hutchinson;  Location: [facility] ORS;  Service: Orthopedics;  Laterality: Right;   PERIPHERAL VASCULAR INTERVENTION  06/26/2023   Procedure: PERIPHERAL VASCULAR INTERVENTION;  Surgeon: Magda Debby SAILOR, MD;  Location: [facility] INVASIVE CV LAB;  Service: Cardiovascular;;   PERIPHERAL VASCULAR THROMBECTOMY Left 06/26/2023   Procedure: PERIPHERAL VASCULAR THROMBECTOMY;  Surgeon: Magda Debby SAILOR, MD;  Location: [facility]VAS[facility] CV LAB;  Service: Cardiovascular;  Laterality: Left;   PERIPHERAL VASCULAR ULTRASOUND/IVUS  06/26/2023   Procedure: Peripheral Vascular Ultrasound/IVUS;  Surgeon: Magda Debby SAILOR, MD;  Location: [facility] INVASIVE CV LAB;  Service: Cardiovascular;;    Social History   Socioeconomic History   Marital status: Married    Spouse name: Not on file   Number of children: Not on file   Years of education: Not on file   Highest education level: Not on file  Occupational History   Not on file  Tobacco Use   Smoking status: Never   Smokeless tobacco: Never   Tobacco comments:    n/a  Vaping Use   Vaping status: Never Used  Substance and Sexual Activity   Alcohol use: No   Drug use: No   Sexual activity: Not on file  Other Topics Concern   Not on file  Social History Narrative   Not on file   Social Drivers of Health   Financial Resource Strain: Low Risk  (11/05/2022)   Overall Financial Resource Strain (CARDIA)    Difficulty of Paying Living Expenses: Not hard at all  Food Insecurity: No Food Insecurity (06/30/2023)   Hunger Vital Sign    Worried About Running Out of Food in the Last Year: Never true    Ran Out of Food in the Last Year: Never true  Transportation Needs: No Transportation Needs (06/30/2023)   PRAPARE - Scientist, Research (physical Sciences) (Medical): No    Lack of Transportation (Non-Medical): No  Physical Activity: Sufficiently Active (11/05/2022)   Exercise Vital Sign    Days of Exercise per Week: 7 days    Minutes of Exercise per Session: 60 min  Stress: No Stress Concern Present (11/05/2022)   [employer] of Occupational Health - Occupational Stress Questionnaire    Feeling of Stress : Not at all  Social Connections: Moderately Integrated (06/25/2023)   Social Connection and Isolation Panel [NHANES]    Frequency of Communication with Friends and Family: More than three times a week    Frequency of Social Gatherings with Friends and Family: More than three times a week    Attends Religious Services: More than 4 times per year    Active Member of [employer] or Organizations: No    Attends Banker Meetings: Never    Marital Status: Married  Catering Manager Violence: Not At Risk (06/30/2023)   Humiliation, Afraid, Rape, and Kick questionnaire    Fear of Current or Ex-Partner: No    Emotionally Abused: No    Physically Abused: No    Sexually Abused: No    Family History  Problem Relation Age of Onset   Hypertension Mother    Multiple myeloma Mother    Hypertension Father    Diabetes Father     Current Outpatient Medications  Medication Sig Dispense Refill   amLODipine  (NORVASC ) 10 MG tablet TAKE 1 TABLET BY MOUTH EVERY DAY 90 tablet 1   apixaban  (ELIQUIS ) 5 MG TABS tablet Take 1 tablet (5 mg total) by mouth 2 (two) times daily. 60 tablet 2   aspirin  EC 81 MG tablet Take 81 mg by mouth daily. Swallow whole.     atorvastatin  (LIPITOR) 40 MG tablet TAKE 1 TABLET BY MOUTH EVERY DAY 90 tablet 1   B-D ULTRAFINE III SHORT PEN 31G X 8 MM MISC Inject into the skin as directed.     cyanocobalamin  (VITAMIN B12) 1000 MCG/ML injection INJECT 1 ML (1,000 MCG TOTAL) INTO THE MUSCLE EVERY 30 DAYS. 9 mL 1   dexamethasone  (DECADRON ) 4 MG tablet Take 1 tablet twice a day the day before, the day of, and the  day after chemotherapy 40 tablet 2   Dulaglutide  (TRULICITY ) 1.5 MG/0.5ML SOAJ Inject 1.5 mg into the skin every Sunday.     enalapril  (VASOTEC ) 20 MG tablet TAKE 1 TABLET TWICE DAILY. 90 tablet 1   folic acid  (FOLVITE ) 1 MG tablet Take 1 tablet (1 mg total) by mouth daily. 30 tablet 2   glucose blood (ONETOUCH VERIO) test strip Use as instructed to check blood sugars twice daily E11.69 100 each 2   glucose blood test strip Use as instructed to test blood sugar 3 times a day. Dx code: e11.65 100 each 2   insulin  glargine (LANTUS ) 100 UNIT/ML injection Inject 0.16  mLs (16 Units total) into the skin at bedtime. (Patient taking differently: Inject 19 Units into the skin at bedtime.) 10 mL 11   oxyCODONE  (OXY IR/ROXICODONE ) 5 MG immediate release tablet Take 1 tablet (5 mg total) by mouth every 6 (six) hours as needed for moderate pain. 28 tablet 0   sildenafil  (VIAGRA ) 100 MG tablet TAKE 1 TABLET BY MOUTH EVERY DAY AS NEEDED (INSURANCE COVERS 10 TAB PER 77DAYS) 10 tablet 5   SYRINGE-NEEDLE, DISP, 3 ML (B-D INTEGRA SYRINGE) 25G X 5/8 3 ML MISC USE AS DIRECTED 12 each 24   No current facility-administered medications for this visit.    No Known Allergies  REVIEW OF SYSTEMS (negative unless checked):   Cardiac:  []  Chest pain or chest pressure? []  Shortness of breath upon activity? []  Shortness of breath when lying flat? []  Irregular heart rhythm?  Vascular:  []  Pain in calf, thigh, or hip brought on by walking? []  Pain in feet at night that wakes you up from your sleep? []  Blood clot in your veins? [x]  Leg swelling?  Pulmonary:  []  Oxygen at home? []  Productive cough? []  Wheezing?  Neurologic:  []  Sudden weakness in arms or legs? []  Sudden numbness in arms or legs? []  Sudden onset of difficult speaking or slurred speech? []  Temporary loss of vision in one eye? []  Problems with dizziness?  Gastrointestinal:  []  Blood in stool? []  Vomited blood?  Genitourinary:  []  Burning  when urinating? []  Blood in urine?  Psychiatric:  []  Major depression  Hematologic:  []  Bleeding problems? []  Problems with blood clotting?  Dermatologic:  []  Rashes or ulcers?  Constitutional:  []  Fever or chills?  Ear/Nose/Throat:  []  Change in hearing? []  Nose bleeds? []  Sore throat?  Musculoskeletal:  []  Back pain? []  Joint pain? []  Muscle pain?   Physical Examination     Vitals:   07/28/23 1013 07/28/23 1017  BP: (!) 159/88 (!) 151/82  Pulse: 71   Temp: 98.1 F (36.7 C)   TempSrc: Temporal   SpO2: 98%   Weight: 238 lb (108 kg)   Height: 6' 3 (1.905 m)    Body mass index is 29.75 kg/m.  General:  WDWN in NAD; vital signs documented above Gait: Not observed HENT: WNL, normocephalic Pulmonary: normal non-labored breathing , without Rales, rhonchi,  wheezing Cardiac: regular Abdomen: soft, NT, no masses Skin: without rashes Vascular Exam/Pulses: BLE warm and well perfused Extremities: No edema of RLE. 1+ left calf swelling Musculoskeletal: no muscle wasting or atrophy  Neurologic: A&O X 3;  No focal weakness or paresthesias are detected Psychiatric:  The pt has Normal affect.   Medical Decision Making   William Mcguire is a 68 y.o. male who presents for follow up  The patient recently underwent IVC and LLE thrombectomy with left iliac vein stenting for extensive LLE DVT His left lower extremity swelling has greatly improved since his procedure. His swelling is also well controlled with leg elevation and compression stockings as needed On exam he has 1+ swelling of the left lower leg. His legs are warm and well perfused He experienced bleeding issues on aspirin  and Eliquis  so he has only been on Eliquis  the past month. He can continue Eliquis  only. Once he is done with a six month course of Eliquis , he can switch back to aspirin  only He can follow up with our office in 4 wks with left IVC/iliac vein duplex and LLE DVT duplex   Tammra Pressman SHAUNNA Holster,  PA-C [facility] Office: [phone]  07/28/2023, 10:20 AM  Clinic MD: Magda

## 2023-07-29 ENCOUNTER — Other Ambulatory Visit: Payer: Self-pay | Admitting: Physician Assistant

## 2023-07-29 ENCOUNTER — Other Ambulatory Visit: Payer: Self-pay

## 2023-07-29 DIAGNOSIS — C3492 Malignant neoplasm of unspecified part of left bronchus or lung: Secondary | ICD-10-CM

## 2023-07-29 MED ORDER — FOLIC ACID 1 MG PO TABS: 1.0000 mg | ORAL_TABLET | Freq: Every day | ORAL | 0 refills | Status: DC

## 2023-07-30 ENCOUNTER — Other Ambulatory Visit: Payer: Self-pay

## 2023-07-30 MED ORDER — ATORVASTATIN CALCIUM 40 MG PO TABS: 40.0000 mg | ORAL_TABLET | Freq: Every day | ORAL | 1 refills | Status: DC

## 2023-08-07 ENCOUNTER — Other Ambulatory Visit: Payer: Self-pay

## 2023-08-07 DIAGNOSIS — I82422 Acute embolism and thrombosis of left iliac vein: Secondary | ICD-10-CM

## 2023-08-18 NOTE — Telephone Encounter (Signed)
 Patient replied to [employer] he received his applications in the mail and will be sending them back in

## 2023-08-20 ENCOUNTER — Other Ambulatory Visit (HOSPITAL_COMMUNITY): Payer: Self-pay

## 2023-08-20 ENCOUNTER — Ambulatory Visit (HOSPITAL_COMMUNITY)
Admission: RE | Admit: 2023-08-20 | Discharge: 2023-08-20 | Disposition: A | Discharge status: Home / Self Care | Payer: Medicare Other | Source: Ambulatory Visit | Attending: Physician Assistant | Admitting: Physician Assistant

## 2023-08-20 DIAGNOSIS — I7121 Aneurysm of the ascending aorta, without rupture: Secondary | ICD-10-CM | POA: Diagnosis not present

## 2023-08-20 DIAGNOSIS — C3490 Malignant neoplasm of unspecified part of unspecified bronchus or lung: Secondary | ICD-10-CM | POA: Insufficient documentation

## 2023-08-20 DIAGNOSIS — J90 Pleural effusion, not elsewhere classified: Secondary | ICD-10-CM | POA: Diagnosis not present

## 2023-08-20 LAB — POCT I-STAT CREATININE: Creatinine, Ser: 1.5 mg/dL — ABNORMAL HIGH (ref 0.61–1.24)

## 2023-08-20 MED ORDER — IOHEXOL 300 MG/ML  SOLN
100.0000 mL | Freq: Once | INTRAMUSCULAR | Status: AC | PRN
  Administered 2023-08-20: 80 mL via INTRAVENOUS

## 2023-08-25 ENCOUNTER — Encounter: Payer: Medicare Other | Admitting: Internal Medicine

## 2023-08-26 ENCOUNTER — Telehealth: Payer: Self-pay | Admitting: Pharmacist

## 2023-08-26 ENCOUNTER — Other Ambulatory Visit: Payer: Self-pay | Admitting: Physician Assistant

## 2023-08-26 DIAGNOSIS — C3492 Malignant neoplasm of unspecified part of left bronchus or lung: Secondary | ICD-10-CM

## 2023-08-26 DIAGNOSIS — E1122 Type 2 diabetes mellitus with diabetic chronic kidney disease: Secondary | ICD-10-CM

## 2023-08-26 NOTE — Telephone Encounter (Signed)
 PAP: Application for [name] and Trulicity has been submitted to [employer], via fax

## 2023-08-26 NOTE — Progress Notes (Signed)
   08/26/2023  Patient ID: [name], male   DOB: 1956/04/28, 68 y.o.   MRN: [id number]  Patient brought forms by the office that have been signed for Trulicity and [name].  Forms were inspected for completion and then faxed to Star [employer], CPhT so they can be properly processed and sent to the [facility] Patient Assistance Program.  Blessings,  Beecher Mcardle, PharmD, [facility] Clinical Pharmacist [phone]

## 2023-08-27 NOTE — Telephone Encounter (Signed)
 PAP: Patient assistance application for [name] and Trulicity has been approved by PAP Companies: [employer] from 08/26/2023 to 06/22/2024. Medication should be delivered to PAP Delivery: Home. For further shipping updates, please contact [employer] at [phone]. Patient ID is: [id number]

## 2023-08-27 NOTE — Progress Notes (Signed)
 Pharmacy Medication Assistance Program Note    08/27/2023  Patient ID: William Mcguire, male   DOB: April 03, 1956, 68 y.o.   MRN: [id number]     07/22/2023  Outreach [employer]  Manufacturer [employer] Retail buyer Drugs [name]  Dose of [name] 16U/DAY  Dose of Trulicity 1.5MG   Type of Radiographer, therapeutic Assistance  Date Application Sent to Patient 07/22/2023  Application Items Requested Application;Proof of Income  Date Application Sent to Prescriber 07/22/2023  Name of Prescriber [name]  Date Application Received From Patient 08/26/2023  Date Application Received From Provider 07/24/2023  Date Application Submitted to Manufacturer 08/26/2023  Method Application Sent to Manufacturer Fax     Signature

## 2023-08-31 ENCOUNTER — Other Ambulatory Visit: Payer: Self-pay

## 2023-08-31 ENCOUNTER — Other Ambulatory Visit: Payer: Self-pay | Admitting: Internal Medicine

## 2023-08-31 ENCOUNTER — Inpatient Hospital Stay: Payer: Medicare Other | Attending: Hematology and Oncology

## 2023-08-31 ENCOUNTER — Inpatient Hospital Stay: Payer: Medicare Other | Admitting: Internal Medicine

## 2023-08-31 VITALS — BP 134/69 | HR 91 | Temp 98.0°F | Resp 18 | Ht 75.0 in | Wt 238.2 lb

## 2023-08-31 DIAGNOSIS — C3490 Malignant neoplasm of unspecified part of unspecified bronchus or lung: Secondary | ICD-10-CM

## 2023-08-31 DIAGNOSIS — E860 Dehydration: Secondary | ICD-10-CM | POA: Insufficient documentation

## 2023-08-31 DIAGNOSIS — D472 Monoclonal gammopathy: Secondary | ICD-10-CM | POA: Insufficient documentation

## 2023-08-31 DIAGNOSIS — C3412 Malignant neoplasm of upper lobe, left bronchus or lung: Secondary | ICD-10-CM | POA: Insufficient documentation

## 2023-08-31 DIAGNOSIS — D649 Anemia, unspecified: Secondary | ICD-10-CM | POA: Insufficient documentation

## 2023-08-31 DIAGNOSIS — D6481 Anemia due to antineoplastic chemotherapy: Secondary | ICD-10-CM

## 2023-08-31 DIAGNOSIS — N189 Chronic kidney disease, unspecified: Secondary | ICD-10-CM | POA: Diagnosis not present

## 2023-08-31 DIAGNOSIS — Z7901 Long term (current) use of anticoagulants: Secondary | ICD-10-CM | POA: Diagnosis not present

## 2023-08-31 DIAGNOSIS — C3432 Malignant neoplasm of lower lobe, left bronchus or lung: Secondary | ICD-10-CM | POA: Insufficient documentation

## 2023-08-31 DIAGNOSIS — I2699 Other pulmonary embolism without acute cor pulmonale: Secondary | ICD-10-CM | POA: Diagnosis not present

## 2023-08-31 DIAGNOSIS — I82402 Acute embolism and thrombosis of unspecified deep veins of left lower extremity: Secondary | ICD-10-CM | POA: Diagnosis not present

## 2023-08-31 LAB — CBC WITH DIFFERENTIAL (CANCER CENTER ONLY)
Abs Immature Granulocytes: 0.07 10*3/uL (ref 0.00–0.07)
Basophils Absolute: 0 10*3/uL (ref 0.0–0.1)
Basophils Relative: 0 %
Eosinophils Absolute: 0.2 10*3/uL (ref 0.0–0.5)
Eosinophils Relative: 3 %
HCT: 24.1 % — ABNORMAL LOW (ref 39.0–52.0)
Hemoglobin: 7.8 g/dL — ABNORMAL LOW (ref 13.0–17.0)
Immature Granulocytes: 1 %
Lymphocytes Relative: 24 %
Lymphs Abs: 1.5 10*3/uL (ref 0.7–4.0)
MCH: 28.7 pg (ref 26.0–34.0)
MCHC: 32.4 g/dL (ref 30.0–36.0)
MCV: 88.6 fL (ref 80.0–100.0)
Monocytes Absolute: 0.7 10*3/uL (ref 0.1–1.0)
Monocytes Relative: 11 %
Neutro Abs: 3.9 10*3/uL (ref 1.7–7.7)
Neutrophils Relative %: 61 %
Platelet Count: 198 10*3/uL (ref 150–400)
RBC: 2.72 MIL/uL — ABNORMAL LOW (ref 4.22–5.81)
RDW: 16.6 % — ABNORMAL HIGH (ref 11.5–15.5)
WBC Count: 6.3 10*3/uL (ref 4.0–10.5)
nRBC: 0 % (ref 0.0–0.2)

## 2023-08-31 LAB — CMP (CANCER CENTER ONLY)
ALT: 12 U/L (ref 0–44)
AST: 27 U/L (ref 15–41)
Albumin: 3.5 g/dL (ref 3.5–5.0)
Alkaline Phosphatase: 133 U/L — ABNORMAL HIGH (ref 38–126)
Anion gap: 4 — ABNORMAL LOW (ref 5–15)
BUN: 21 mg/dL (ref 8–23)
CO2: 26 mmol/L (ref 22–32)
Calcium: 9.2 mg/dL (ref 8.9–10.3)
Chloride: 102 mmol/L (ref 98–111)
Creatinine: 1.53 mg/dL — ABNORMAL HIGH (ref 0.61–1.24)
GFR, Estimated: 49 mL/min — ABNORMAL LOW (ref >60)
Glucose, Bld: 185 mg/dL — ABNORMAL HIGH (ref 70–99)
Potassium: 4.6 mmol/L (ref 3.5–5.1)
Sodium: 132 mmol/L — ABNORMAL LOW (ref 135–145)
Total Bilirubin: 0.4 mg/dL (ref 0.0–1.2)
Total Protein: 9.5 g/dL — ABNORMAL HIGH (ref 6.5–8.1)

## 2023-08-31 LAB — SAMPLE TO BLOOD BANK

## 2023-08-31 LAB — PREPARE RBC (CROSSMATCH)

## 2023-08-31 NOTE — Progress Notes (Signed)
 [facility] [phone]   [phone]  OFFICE PROGRESS NOTE  [name], MD [address] [place] [address]  DIAGNOSIS:  1) Stage IIA (T2b, N0, M0) non-small cell lung cancer, adenocarcinoma. He presented with a left upper lobe lesion. He was diagnosed in 2024.  2) left lower extremity deep venous thrombosis as well as pulmonary emboli diagnosed in January 2025  Molecular studies  no actionable mutations. Has KRAS G12V which is not actionable   PDL1: 6%   PRIOR THERAPY:   1) status post right upper lobectomy with lymph node sampling by Dr. Cliffton Asters on March 18, 2023.  2) vascular mechanical thrombectomy of the inferior vena cava, left common iliac vein, left external iliac vein, left common femoral vein, left femoral vein deep venous thrombosis with left common iliac vein angioplasty stenting under the care of Dr. Lenell Antu on June 26, 2023.   CURRENT THERAPY: Adjuvant treatment with carboplatin and Alimta for 4 cycles. He is not a good candidate for cisplatin due to his CKD. He is status post 3 cycles. First dose on 04/13/23  INTERVAL HISTORY: William Mcguire 68 y.o. male returns to the clinic today for follow-up visit and his wife was available by phone during the visit.  Discussed the use of AI scribe software for clinical note transcription with the patient, who gave verbal consent to proceed.  History of Present Illness   The patient is a 68 year old with stage 2A non-small cell lung cancer adenocarcinoma who presents with anemia and fatigue.  He has a history of stage 2A non-small cell lung cancer adenocarcinoma diagnosed in September 2024. He underwent a left lower lobectomy followed by four cycles of chemotherapy with carboplatin and Alimta. Recent imaging of the chest, abdomen, and pelvis on August 20, 2023, showed no concerning findings.  He presents with anemia, indicated by a low hemoglobin level of 7.8, and  experiences fatigue since his last visit. He is not currently taking any iron supplements, although he was previously on vitamin C and iron during chemotherapy. No recent bleeding, including epistaxis or blood in his stool, has been noted.  In January 2025, he was diagnosed with left lower extremity venous thrombosis and a pulmonary embolism. He is currently on Eliquis 5 mg twice a day. Aspirin was discontinued due to bleeding concerns, and since stopping aspirin, he has not experienced any further bleeding.  He reports a sensation of gas in his stomach and mentions that a scheduled colonoscopy was not performed due to his Eliquis use. His last colonoscopy was nearly a year ago. No recent epistaxis or blood in his stool have been reported.  He notes occasional swelling in the bottom of his mouth, which resolves after a couple of days without pain. This has occurred twice, with no specific side of the jaw affected.        MEDICAL HISTORY: Past Medical History:  Diagnosis Date   Diabetes mellitus    High cholesterol    Hypertension     ALLERGIES:  has no known allergies.  MEDICATIONS:  Current Outpatient Medications  Medication Sig Dispense Refill   amLODipine (NORVASC) 10 MG tablet TAKE 1 TABLET BY MOUTH EVERY DAY 90 tablet 1   apixaban (ELIQUIS) 5 MG TABS tablet Take 1 tablet (5 mg total) by mouth 2 (two) times daily. 60 tablet 2   aspirin EC 81 MG tablet Take 81 mg by mouth daily. Swallow whole.  atorvastatin (LIPITOR) 40 MG tablet Take 1 tablet (40 mg total) by mouth daily. 90 tablet 1   B-D ULTRAFINE III SHORT PEN 31G X 8 MM MISC Inject into the skin as directed.     cyanocobalamin (VITAMIN B12) 1000 MCG/ML injection INJECT 1 ML (1,000 MCG TOTAL) INTO THE MUSCLE EVERY 30 DAYS. 9 mL 1   dexamethasone (DECADRON) 4 MG tablet Take 1 tablet twice a day the day before, the day of, and the day after chemotherapy 40 tablet 2   Dulaglutide (TRULICITY) 1.5 MG/0.5ML SOAJ Inject 1.5 mg into  the skin every Sunday.     enalapril (VASOTEC) 20 MG tablet TAKE 1 TABLET TWICE DAILY. 90 tablet 1   folic acid (FOLVITE) 1 MG tablet TAKE 1 TABLET BY MOUTH EVERY DAY 90 tablet 1   glucose blood (ONETOUCH VERIO) test strip Use as instructed to check blood sugars twice daily E11.69 100 each 2   glucose blood test strip Use as instructed to test blood sugar 3 times a day. Dx code: e11.65 100 each 2   insulin glargine (LANTUS) 100 UNIT/ML injection Inject 0.16 mLs (16 Units total) into the skin at bedtime. (Patient taking differently: Inject 19 Units into the skin at bedtime.) 10 mL 11   oxyCODONE (OXY IR/ROXICODONE) 5 MG immediate release tablet Take 1 tablet (5 mg total) by mouth every 6 (six) hours as needed for moderate pain. 28 tablet 0   sildenafil (VIAGRA) 100 MG tablet TAKE 1 TABLET BY MOUTH EVERY DAY AS NEEDED (INSURANCE COVERS 10 TAB PER 77DAYS) 10 tablet 5   SYRINGE-NEEDLE, DISP, 3 ML (B-D INTEGRA SYRINGE) 25G X 5/8" 3 ML MISC USE AS DIRECTED 12 each 24   No current facility-administered medications for this visit.    SURGICAL HISTORY:  Past Surgical History:  Procedure Laterality Date   BRONCHIAL BIOPSY  01/05/2023   Procedure: BRONCHIAL BIOPSIES;  Surgeon: Josephine Igo, DO;  Location: [facility];  Service: Pulmonary;;   BRONCHIAL BRUSHINGS  01/05/2023   Procedure: BRONCHIAL BRUSHINGS;  Surgeon: Josephine Igo, DO;  Location: [facility];  Service: Pulmonary;;   BRONCHIAL NEEDLE ASPIRATION BIOPSY  01/05/2023   Procedure: BRONCHIAL NEEDLE ASPIRATION BIOPSIES;  Surgeon: Josephine Igo, DO;  Location: [facility];  Service: Pulmonary;;   INTERCOSTAL NERVE BLOCK Left 03/18/2023   Procedure: INTERCOSTAL NERVE BLOCK;  Surgeon: Corliss Skains, MD;  Location: [facility] OR;  Service: Thoracic;  Laterality: Left;   KNEE SURGERY Left 2004   torn cartilage   LYMPH NODE BIOPSY Left 03/18/2023   Procedure: LYMPH NODE BIOPSY;  Surgeon: Corliss Skains, MD;  Location: [facility];  Service:  Thoracic;  Laterality: Left;   PATELLAR TENDON REPAIR  06/11/2011   Procedure: PATELLA TENDON REPAIR;  Surgeon: Cammy Copa;  Location: [facility];  Service: Orthopedics;  Laterality: Right;   PERIPHERAL VASCULAR INTERVENTION  06/26/2023   Procedure: PERIPHERAL VASCULAR INTERVENTION;  Surgeon: Leonie Douglas, MD;  Location: [facility];  Service: Cardiovascular;;   PERIPHERAL VASCULAR THROMBECTOMY Left 06/26/2023   Procedure: PERIPHERAL VASCULAR THROMBECTOMY;  Surgeon: Leonie Douglas, MD;  Location: [facility];  Service: Cardiovascular;  Laterality: Left;   PERIPHERAL VASCULAR ULTRASOUND/IVUS  06/26/2023   Procedure: Peripheral Vascular Ultrasound/IVUS;  Surgeon: Leonie Douglas, MD;  Location: [facility] CV LAB;  Service: Cardiovascular;;    REVIEW OF SYSTEMS:  Constitutional: positive for fatigue Eyes: negative Ears, nose, mouth, throat, and face: negative Respiratory: negative Cardiovascular: negative Gastrointestinal: negative Genitourinary:negative  Integument/breast: negative Hematologic/lymphatic: negative Musculoskeletal:negative Neurological: negative Behavioral/Psych: negative Endocrine: negative Allergic/Immunologic: negative   PHYSICAL EXAMINATION: General appearance: alert, cooperative, appears stated age, fatigued, and no distress Head: Normocephalic, without obvious abnormality, atraumatic Neck: no adenopathy, no JVD, supple, symmetrical, trachea midline, and thyroid not enlarged, symmetric, no tenderness/mass/nodules Lymph nodes: Cervical, supraclavicular, and axillary nodes normal. Resp: clear to auscultation bilaterally Back: symmetric, no curvature. ROM normal. No CVA tenderness. Cardio: regular rate and rhythm, S1, S2 normal, no murmur, click, rub or gallop GI: soft, non-tender; bowel sounds normal; no masses,  no organomegaly Extremities: edema 1+ edema left lower extremity Neurologic: Alert and oriented X 3, normal strength and tone. Normal  symmetric reflexes. Normal coordination and gait  ECOG PERFORMANCE STATUS: 1 - Symptomatic but completely ambulatory  Blood pressure 134/69, pulse 91, temperature 98 F (36.7 C), temperature source Temporal, resp. rate 18, height 6\' 3"  (1.905 m), weight 238 lb 3.2 oz (108 kg), SpO2 100%.  LABORATORY DATA: Lab Results  Component Value Date   WBC 6.3 08/31/2023   HGB 7.8 (L) 08/31/2023   HCT 24.1 (L) 08/31/2023   MCV 88.6 08/31/2023   PLT 198 08/31/2023      Chemistry      Component Value Date/Time   NA 132 (L) 07/27/2023 1301   NA 137 11/05/2022 1152   K 4.1 07/27/2023 1301   CL 101 07/27/2023 1301   CO2 27 07/27/2023 1301   BUN 12 07/27/2023 1301   BUN 11 11/05/2022 1152   CREATININE 1.50 (H) 08/20/2023 0804   CREATININE 1.31 (H) 07/27/2023 1301      Component Value Date/Time   CALCIUM 9.1 07/27/2023 1301   ALKPHOS 123 07/27/2023 1301   AST 26 07/27/2023 1301   ALT 19 07/27/2023 1301   BILITOT 0.3 07/27/2023 1301       RADIOGRAPHIC STUDIES: CT CHEST ABDOMEN PELVIS W CONTRAST Result Date: 08/28/2023 CLINICAL DATA:  Lung cancer, metastatic disease evaluation. * Tracking Code: BO * EXAM: CT CHEST, ABDOMEN, AND PELVIS WITH CONTRAST TECHNIQUE: Multidetector CT imaging of the chest, abdomen and pelvis was performed following the standard protocol during bolus administration of intravenous contrast. RADIATION DOSE REDUCTION: This exam was performed according to the departmental dose-optimization program which includes automated exposure control, adjustment of the mA and/or kV according to patient size and/or use of iterative reconstruction technique. CONTRAST:  80mL OMNIPAQUE IOHEXOL 300 MG/ML  SOLN COMPARISON:  CT chest 06/24/2023 and PET 12/09/2022. FINDINGS: CT CHEST FINDINGS Cardiovascular: Heart is at the upper limits of normal in size to mildly enlarged. No pericardial effusion. Ascending aorta measures 4.0 cm (coronal image 59), stable. Mediastinum/Nodes: Thoracic inlet lymph  nodes are not enlarged by CT size criteria. No pathologically enlarged mediastinal, hilar or axillary lymph nodes. Fluid and air in the esophagus can be seen with dysmotility. Lungs/Pleura: Left upper lobectomy. Scarring in the anterior left lower lobe. Similar mucoid impaction in the posterior left lower lobe (4/50). Moderate loculated left pleural effusion, similar. Airway is otherwise unremarkable. Musculoskeletal: Degenerative changes in the spine. No worrisome lytic or sclerotic lesions. CT ABDOMEN PELVIS FINDINGS Hepatobiliary: Liver and gallbladder are unremarkable. No biliary ductal dilatation. Pancreas: Negative. Spleen: Negative. Adrenals/Urinary Tract: Right adrenal gland is unremarkable. 1.7 cm left adrenal nodule, shown to represent an adenoma on 12/09/2022. No specific follow-up necessary. Subcentimeter low-attenuation lesions in the kidneys, too small to characterize. No specific follow-up necessary. Scarring in the left kidney. Ureters are decompressed. Bladder is grossly unremarkable. Stomach/Bowel: Stomach, small bowel, appendix and colon are  unremarkable. Vascular/Lymphatic: Atherosclerotic calcification of the aorta. No pathologically enlarged lymph nodes. Left common iliac vein stent. Reproductive: Prostate is visualized. Other: No free fluid.  Mesenteries and peritoneum are unremarkable. Musculoskeletal: Degenerative changes in the spine. IMPRESSION: 1. Postsurgical/post treatment changes in the left hemithorax. No evidence of recurrent or metastatic disease. 2. Stable mucoid impaction in the posterior left upper lobe. This can be reassessed on routine follow-up imaging. 3. Moderate loculated left pleural effusion, unchanged. 4. 4.0 cm ascending aortic aneurysm, stable. Recommend annual imaging followup by CTA or MRA. This recommendation follows 2010 ACCF/AHA/AATS/ACR/ASA/SCA/SCAI/SIR/STS/SVM Guidelines for the Diagnosis and Management of Patients with Thoracic Aortic Disease. Circulation.  2010; 121: W295-A213. Aortic aneurysm NOS (ICD10-I71.9). 5. Left adrenal adenoma. 6.  Aortic atherosclerosis (ICD10-I70.0). Electronically Signed   By: Leanna Battles M.D.   On: 08/28/2023 10:53    ASSESSMENT AND PLAN: This is a very pleasant 67 years old African-American male with Stage IIA (T2b, N0, M0) non-small cell lung cancer, adenocarcinoma. He presented with a left upper lobe lesion. He was diagnosed in September of 2024.  Molecular studies showed no actionable mutations and PD-L1 expression of 6%. He is status post right upper lobectomy with lymph node sampling under the care of Dr. Cliffton Asters in September 2024 The patient underwent adjuvant systemic chemotherapy with carboplatin for AUC of 5 and Alimta 500 Mg/M2 every 3 weeks status post 4 cycles.  He tolerated his treatment fairly well. He was diagnosed with left lower extremity deep venous thrombosis as well as pulmonary emboli diagnosed in January 2025.  Status post surgical thrombectomy and currently on treatment with Eliquis twice daily. Assessment and Plan    Stage IIA Non-Small Cell Lung Cancer (Adenocarcinoma) Diagnosed in September 2024. Underwent left lower lobectomy followed by four cycles of carboplatin and Alimta. Last scan on August 20, 2023, showed no concerning findings. Follow-up scans every three months are planned. - Schedule follow-up chest scan in three months  Left Lower Extremity Venous Thrombosis and Pulmonary Embolism Diagnosed in January 2025. Currently on Eliquis 5 mg BID. Aspirin was discontinued due to bleeding concerns. No recent bleeding episodes. Discussed holding Eliquis for a day or two if upper endoscopy is scheduled, then resuming it. Explained that holding Eliquis for a short period is generally safe after three months of treatment. - Continue Eliquis 5 mg BID - Hold Eliquis for a day or two if upper endoscopy is scheduled, then resume  Anemia Hemoglobin is 7.8, indicating significant anemia, likely  related to blood loss and exacerbated by Eliquis. No visible signs of bleeding. Fatigue and gastrointestinal discomfort reported. Last colonoscopy was nearly a year ago; upper endoscopy recommended. Discussed need for iron supplementation and potential blood transfusion. - Arrange for one unit of blood transfusion this week - Start iron supplement daily with vitamin C or orange juice - Schedule upper endoscopy  Oral Hematoma Intermittent swelling at the bottom of the mouth, likely related to Eliquis. No pain reported, and swelling resolves spontaneously. Explained this could be a small hematoma due to anticoagulation therapy. - Monitor for recurrence and consult dentist if persistent  Follow-up - Schedule follow-up appointment in three months - Coordinate with Dr. Marca Ancona for upper endoscopy and any necessary procedures.     The patient was advised to call immediately if he has any concerning symptoms in the interval. The patient voices understanding of current disease status and treatment options and is in agreement with the current care plan.  All questions were answered. The patient knows to call  the clinic with any problems, questions or concerns. We can certainly see the patient much sooner if necessary.  The total time spent in the appointment was 30 minutes.  Disclaimer: This note was dictated with voice recognition software. Similar sounding words can inadvertently be transcribed and may not be corrected upon review.

## 2023-08-31 NOTE — Progress Notes (Signed)
 Spoke with Alexis in the blood bank and confirmed T&S and prepare orders for transfusion tomorrow.

## 2023-09-01 ENCOUNTER — Inpatient Hospital Stay

## 2023-09-01 ENCOUNTER — Other Ambulatory Visit: Payer: Self-pay

## 2023-09-01 DIAGNOSIS — Z7901 Long term (current) use of anticoagulants: Secondary | ICD-10-CM | POA: Diagnosis not present

## 2023-09-01 DIAGNOSIS — D649 Anemia, unspecified: Secondary | ICD-10-CM | POA: Diagnosis not present

## 2023-09-01 DIAGNOSIS — C3412 Malignant neoplasm of upper lobe, left bronchus or lung: Secondary | ICD-10-CM | POA: Diagnosis not present

## 2023-09-01 DIAGNOSIS — I82402 Acute embolism and thrombosis of unspecified deep veins of left lower extremity: Secondary | ICD-10-CM | POA: Diagnosis not present

## 2023-09-01 DIAGNOSIS — I2699 Other pulmonary embolism without acute cor pulmonale: Secondary | ICD-10-CM | POA: Diagnosis not present

## 2023-09-01 DIAGNOSIS — D472 Monoclonal gammopathy: Secondary | ICD-10-CM | POA: Diagnosis not present

## 2023-09-01 DIAGNOSIS — N189 Chronic kidney disease, unspecified: Secondary | ICD-10-CM | POA: Diagnosis not present

## 2023-09-01 DIAGNOSIS — D6481 Anemia due to antineoplastic chemotherapy: Secondary | ICD-10-CM

## 2023-09-01 DIAGNOSIS — E860 Dehydration: Secondary | ICD-10-CM | POA: Diagnosis not present

## 2023-09-01 MED ORDER — ACETAMINOPHEN 325 MG PO TABS
650.0000 mg | ORAL_TABLET | Freq: Once | ORAL | Status: AC
Start: 2023-09-01 — End: 2023-09-01
  Administered 2023-09-01: 650 mg via ORAL
  Filled 2023-09-01: qty 2

## 2023-09-01 MED ORDER — SODIUM CHLORIDE 0.9% IV SOLUTION
250.0000 mL | INTRAVENOUS | Status: DC
  Administered 2023-09-01: 100 mL via INTRAVENOUS

## 2023-09-01 MED ORDER — DIPHENHYDRAMINE HCL 25 MG PO CAPS
25.0000 mg | ORAL_CAPSULE | Freq: Once | ORAL | Status: AC
  Administered 2023-09-01: 25 mg via ORAL
  Filled 2023-09-01: qty 1

## 2023-09-01 NOTE — Patient Instructions (Signed)

## 2023-09-02 LAB — TYPE AND SCREEN
ABO/RH(D): B POS
Antibody Screen: NEGATIVE
Unit division: 0

## 2023-09-02 LAB — BPAM RBC
Blood Product Expiration Date: 202504072359
ISSUE DATE / TIME: 202503111129
Unit Type and Rh: 202504072359
Unit Type and Rh: 7300

## 2023-09-04 DIAGNOSIS — H524 Presbyopia: Secondary | ICD-10-CM | POA: Diagnosis not present

## 2023-09-04 LAB — HM DIABETES EYE EXAM

## 2023-09-07 ENCOUNTER — Telehealth: Payer: Self-pay

## 2023-09-07 ENCOUNTER — Other Ambulatory Visit

## 2023-09-07 ENCOUNTER — Inpatient Hospital Stay

## 2023-09-07 ENCOUNTER — Ambulatory Visit: Payer: Self-pay | Admitting: Internal Medicine

## 2023-09-07 ENCOUNTER — Telehealth: Payer: Self-pay | Admitting: Physician Assistant

## 2023-09-07 ENCOUNTER — Encounter: Payer: Self-pay | Admitting: Physician Assistant

## 2023-09-07 ENCOUNTER — Ambulatory Visit: Admitting: Physician Assistant

## 2023-09-07 ENCOUNTER — Other Ambulatory Visit: Payer: Self-pay | Admitting: Physician Assistant

## 2023-09-07 DIAGNOSIS — D472 Monoclonal gammopathy: Secondary | ICD-10-CM

## 2023-09-07 DIAGNOSIS — N189 Chronic kidney disease, unspecified: Secondary | ICD-10-CM | POA: Diagnosis not present

## 2023-09-07 DIAGNOSIS — T451X5A Adverse effect of antineoplastic and immunosuppressive drugs, initial encounter: Secondary | ICD-10-CM

## 2023-09-07 DIAGNOSIS — I82402 Acute embolism and thrombosis of unspecified deep veins of left lower extremity: Secondary | ICD-10-CM | POA: Diagnosis not present

## 2023-09-07 DIAGNOSIS — D6481 Anemia due to antineoplastic chemotherapy: Secondary | ICD-10-CM

## 2023-09-07 DIAGNOSIS — C3412 Malignant neoplasm of upper lobe, left bronchus or lung: Secondary | ICD-10-CM | POA: Diagnosis not present

## 2023-09-07 DIAGNOSIS — C3490 Malignant neoplasm of unspecified part of unspecified bronchus or lung: Secondary | ICD-10-CM

## 2023-09-07 DIAGNOSIS — D649 Anemia, unspecified: Secondary | ICD-10-CM | POA: Diagnosis not present

## 2023-09-07 DIAGNOSIS — E860 Dehydration: Secondary | ICD-10-CM | POA: Diagnosis not present

## 2023-09-07 DIAGNOSIS — Z7901 Long term (current) use of anticoagulants: Secondary | ICD-10-CM | POA: Diagnosis not present

## 2023-09-07 DIAGNOSIS — I2699 Other pulmonary embolism without acute cor pulmonale: Secondary | ICD-10-CM | POA: Diagnosis not present

## 2023-09-07 LAB — CBC WITH DIFFERENTIAL (CANCER CENTER ONLY)
Abs Immature Granulocytes: 0.04 10*3/uL (ref 0.00–0.07)
Basophils Absolute: 0 10*3/uL (ref 0.0–0.1)
Basophils Relative: 0 %
Eosinophils Absolute: 0.1 10*3/uL (ref 0.0–0.5)
Eosinophils Relative: 2 %
HCT: 29.1 % — ABNORMAL LOW (ref 39.0–52.0)
Hemoglobin: 9.5 g/dL — ABNORMAL LOW (ref 13.0–17.0)
Immature Granulocytes: 1 %
Lymphocytes Relative: 25 %
Lymphs Abs: 1.4 10*3/uL (ref 0.7–4.0)
MCH: 28.8 pg (ref 26.0–34.0)
MCHC: 32.6 g/dL (ref 30.0–36.0)
MCV: 88.2 fL (ref 80.0–100.0)
Monocytes Absolute: 0.7 10*3/uL (ref 0.1–1.0)
Monocytes Relative: 12 %
Neutro Abs: 3.4 10*3/uL (ref 1.7–7.7)
Neutrophils Relative %: 60 %
Platelet Count: 202 10*3/uL (ref 150–400)
RBC: 3.3 MIL/uL — ABNORMAL LOW (ref 4.22–5.81)
RDW: 15.9 % — ABNORMAL HIGH (ref 11.5–15.5)
WBC Count: 5.6 10*3/uL (ref 4.0–10.5)
nRBC: 0 % (ref 0.0–0.2)

## 2023-09-07 LAB — FERRITIN: Ferritin: 1209 ng/mL — ABNORMAL HIGH (ref 24–336)

## 2023-09-07 LAB — IRON AND IRON BINDING CAPACITY (CC-WL,HP ONLY)
Iron: 104 ug/dL (ref 45–182)
Saturation Ratios: 44 % — ABNORMAL HIGH (ref 17.9–39.5)
TIBC: 237 ug/dL — ABNORMAL LOW (ref 250–450)
UIBC: 133 ug/dL (ref 117–376)

## 2023-09-07 LAB — CMP (CANCER CENTER ONLY)
ALT: 22 U/L (ref 0–44)
AST: 39 U/L (ref 15–41)
Albumin: 3.8 g/dL (ref 3.5–5.0)
Alkaline Phosphatase: 128 U/L — ABNORMAL HIGH (ref 38–126)
Anion gap: 4 — ABNORMAL LOW (ref 5–15)
BUN: 22 mg/dL (ref 8–23)
CO2: 26 mmol/L (ref 22–32)
Calcium: 9.3 mg/dL (ref 8.9–10.3)
Chloride: 104 mmol/L (ref 98–111)
Creatinine: 1.68 mg/dL — ABNORMAL HIGH (ref 0.61–1.24)
GFR, Estimated: 44 mL/min — ABNORMAL LOW (ref >60)
Glucose, Bld: 113 mg/dL — ABNORMAL HIGH (ref 70–99)
Potassium: 4.7 mmol/L (ref 3.5–5.1)
Sodium: 134 mmol/L — ABNORMAL LOW (ref 135–145)
Total Bilirubin: 0.6 mg/dL (ref 0.0–1.2)
Total Protein: 9.9 g/dL — ABNORMAL HIGH (ref 6.5–8.1)

## 2023-09-07 LAB — SAMPLE TO BLOOD BANK

## 2023-09-07 LAB — FOLATE: Folate: >40 ng/mL (ref >5.9)

## 2023-09-07 LAB — VITAMIN B12: Vitamin B-12: 877 pg/mL (ref 180–914)

## 2023-09-07 NOTE — Progress Notes (Unsigned)
 [facility] OFFICE PROGRESS NOTE  [name], MD [address] [place] [address]  DIAGNOSIS:  1) Stage IIA (T2b, N0, M0) non-small cell lung cancer, adenocarcinoma. He presented with a left upper lobe lesion. He was diagnosed in 2024.  2) left lower extremity deep venous thrombosis as well as pulmonary emboli diagnosed in January 2025   Molecular studies  no actionable mutations. Has KRAS G12V which is not actionable   PDL1: 6%  PRIOR THERAPY: 1) status post right upper lobectomy with lymph node sampling by Dr. Cliffton Asters on March 18, 2023.  2) vascular mechanical thrombectomy of the inferior vena cava, left common iliac vein, left external iliac vein, left common femoral vein, left femoral vein deep venous thrombosis with left common iliac vein angioplasty stenting under the care of Dr. Lenell Antu on June 26, 2023. 3) Adjuvant treatment with carboplatin and Alimta for 4 cycles. He is not a good candidate for cisplatin due to his CKD. He is status post 4 cycles. Last dose on 07/07/23  CURRENT THERAPY:  Observation   INTERVAL HISTORY: William Mcguire 68 y.o. male returns to the clinic today for a symptom management appointment due to concerns for decreased appetite and weakness.  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  He was also diagnosed with stage II lung cancer in 2024 for which he underwent lobectomy in September 2024 and completed 4 cycles of adjuvant chemotherapy with Alimta and carboplatin.  His last dose was in January 2025.  The patient has baseline anemia.  His hemoglobin typically hovers in the 8 and 9 range.  The patient was supposed to have a colonoscopy a couple months ago but had to be canceled due to hospitalization for history of DVT and PE.  They would like him to be on anticoagulation for a few months prior to rescheduling his colonoscopy.  They are in touch with his gastroenterologist about  rescheduling this.  Overall the patient was feeling fairly well at his appointment with Dr. Arbutus Ped last week on 08/31/2023.  He received a blood transfusion on Tuesday and by Wednesday to Thursday of last week the patient developed diarrhea, weakness, fatigue, and a fever of 102.5.  His diarrhea only lasted a day or so and he is back to having baseline constipation.  Patient's wife also had been feeling well a few days prior to this and their grandson came over earlier in the week last week.  He is not having any abdominal pain.  He does sometimes have early satiety.  According to the scales, he lost approximately 10 pounds since last week.  He denies any chills or any unusual night sweats.  He denies any URI symptoms such as nasal congestion, sore throat, cough, or dyspnea on exertion.  He denies any nausea or vomiting.  He did not test himself for any viral infections.   He is here today for evaluation and repeat blood work.  MEDICAL HISTORY: Past Medical History:  Diagnosis Date   Diabetes mellitus    High cholesterol    Hypertension     ALLERGIES:  has no known allergies.  MEDICATIONS:  Current Outpatient Medications  Medication Sig Dispense Refill   amLODipine (NORVASC) 10 MG tablet TAKE 1 TABLET BY MOUTH EVERY DAY 90 tablet 1   apixaban (ELIQUIS) 5 MG TABS tablet Take 1 tablet (5 mg total) by mouth 2 (two) times daily. 60 tablet 2   aspirin EC 81 MG  tablet Take 81 mg by mouth daily. Swallow whole.     atorvastatin (LIPITOR) 40 MG tablet Take 1 tablet (40 mg total) by mouth daily. 90 tablet 1   B-D ULTRAFINE III SHORT PEN 31G X 8 MM MISC Inject into the skin as directed.     cyanocobalamin (VITAMIN B12) 1000 MCG/ML injection INJECT 1 ML (1,000 MCG TOTAL) INTO THE MUSCLE EVERY 30 DAYS. 9 mL 1   dexamethasone (DECADRON) 4 MG tablet Take 1 tablet twice a day the day before, the day of, and the day after chemotherapy (Patient not taking: Reported on 09/08/2023) 40 tablet 2   Dulaglutide  (TRULICITY) 1.5 MG/0.5ML SOAJ Inject 1.5 mg into the skin every Sunday.     enalapril (VASOTEC) 20 MG tablet TAKE 1 TABLET TWICE DAILY. 90 tablet 1   folic acid (FOLVITE) 1 MG tablet TAKE 1 TABLET BY MOUTH EVERY DAY 90 tablet 1   glucose blood (ONETOUCH VERIO) test strip Use as instructed to check blood sugars twice daily E11.69 100 each 2   glucose blood test strip Use as instructed to test blood sugar 3 times a day. Dx code: e11.65 100 each 2   insulin glargine (LANTUS) 100 UNIT/ML injection Inject 0.16 mLs (16 Units total) into the skin at bedtime. (Patient taking differently: Inject 19 Units into the skin at bedtime.) 10 mL 11   oxyCODONE (OXY IR/ROXICODONE) 5 MG immediate release tablet Take 1 tablet (5 mg total) by mouth every 6 (six) hours as needed for moderate pain. 28 tablet 0   sildenafil (VIAGRA) 100 MG tablet TAKE 1 TABLET BY MOUTH EVERY DAY AS NEEDED (INSURANCE COVERS 10 TAB PER 77DAYS) 10 tablet 5   SYRINGE-NEEDLE, DISP, 3 ML (B-D INTEGRA SYRINGE) 25G X 5/8" 3 ML MISC USE AS DIRECTED 12 each 24   No current facility-administered medications for this visit.   Facility-Administered Medications Ordered in Other Visits  Medication Dose Route Frequency Provider Last Rate Last Admin   0.9 %  sodium chloride infusion   Intravenous Once Darwin Rothlisberger L, PA-C        SURGICAL HISTORY:  Past Surgical History:  Procedure Laterality Date   BRONCHIAL BIOPSY  01/05/2023   Procedure: BRONCHIAL BIOPSIES;  Surgeon: Josephine Igo, DO;  Location: [facility];  Service: Pulmonary;;   BRONCHIAL BRUSHINGS  01/05/2023   Procedure: BRONCHIAL BRUSHINGS;  Surgeon: Josephine Igo, DO;  Location: [facility];  Service: Pulmonary;;   BRONCHIAL NEEDLE ASPIRATION BIOPSY  01/05/2023   Procedure: BRONCHIAL NEEDLE ASPIRATION BIOPSIES;  Surgeon: Josephine Igo, DO;  Location: [facility];  Service: Pulmonary;;   INTERCOSTAL NERVE BLOCK Left 03/18/2023   Procedure: INTERCOSTAL NERVE BLOCK;   Surgeon: Corliss Skains, MD;  Location: [facility] OR;  Service: Thoracic;  Laterality: Left;   KNEE SURGERY Left 2004   torn cartilage   LYMPH NODE BIOPSY Left 03/18/2023   Procedure: LYMPH NODE BIOPSY;  Surgeon: Corliss Skains, MD;  Location: [facility];  Service: Thoracic;  Laterality: Left;   PATELLAR TENDON REPAIR  06/11/2011   Procedure: PATELLA TENDON REPAIR;  Surgeon: Cammy Copa;  Location: [facility];  Service: Orthopedics;  Laterality: Right;   PERIPHERAL VASCULAR INTERVENTION  06/26/2023   Procedure: PERIPHERAL VASCULAR INTERVENTION;  Surgeon: Leonie Douglas, MD;  Location: [facility];  Service: Cardiovascular;;   PERIPHERAL VASCULAR THROMBECTOMY Left 06/26/2023   Procedure: PERIPHERAL VASCULAR THROMBECTOMY;  Surgeon: Leonie Douglas, MD;  Location: [facility];  Service: Cardiovascular;  Laterality: Left;   PERIPHERAL VASCULAR ULTRASOUND/IVUS  06/26/2023   Procedure: Peripheral Vascular Ultrasound/IVUS;  Surgeon: Leonie Douglas, MD;  Location: [facility] INVASIVE CV LAB;  Service: Cardiovascular;;    REVIEW OF SYSTEMS:   Review of Systems  Constitutional: Positive for fatigue, generalized weakness, appetite change, and weight loss.  Negative for chills. He has not had a fever since last week.  HENT: Negative for mouth sores, nosebleeds, sore throat and trouble swallowing.   Eyes: Negative for eye problems and icterus.  Respiratory: Negative for cough, hemoptysis, shortness of breath and wheezing.   Cardiovascular: Negative for chest pain and leg swelling.  Gastrointestinal: Positive for constipation. Negative for abdominal pain, diarrhea (resolved), nausea and vomiting.  Genitourinary: Negative for bladder incontinence, difficulty urinating, dysuria, frequency and hematuria.   Musculoskeletal: Negative for back pain, gait problem, neck pain and neck stiffness.  Skin: Negative for itching and rash.  Neurological: Negative for dizziness, extremity weakness, gait problem,  headaches, light-headedness and seizures.  Hematological: Negative for adenopathy. Does not bruise/bleed easily.  Psychiatric/Behavioral: Negative for confusion, depression and sleep disturbance. The patient is not nervous/anxious.     PHYSICAL EXAMINATION:  Blood pressure (!) 146/82, pulse 77, temperature 97.7 F (36.5 C), temperature source Temporal, resp. rate 16, weight 227 lb 14.4 oz (103.4 kg), SpO2 100%.  ECOG PERFORMANCE STATUS: 1  Physical Exam  Constitutional: Oriented to person, place, and time and well-developed, well-nourished, and in no distress.  HENT:  Head: Normocephalic and atraumatic.  Mouth/Throat: Oropharynx is clear and moist. No oropharyngeal exudate.  Eyes: Conjunctivae are normal. Right eye exhibits no discharge. Left eye exhibits no discharge. No scleral icterus.  Neck: Normal range of motion. Neck supple.  Cardiovascular: Normal rate, regular rhythm, normal heart sounds and intact distal pulses.   Pulmonary/Chest: Effort normal and breath sounds normal. No respiratory distress. No wheezes. No rales.  Abdominal: Soft. Bowel sounds are normal. Exhibits no distension and no mass. There is no tenderness.  Musculoskeletal: Normal range of motion. Exhibits no edema.  Lymphadenopathy:    No cervical adenopathy.  Neurological: Alert and oriented to person, place, and time. Exhibits normal muscle tone. Gait normal. Coordination normal.  Skin: Skin is warm and dry. No rash noted. Not diaphoretic. No erythema. No pallor.  Psychiatric: Mood, memory and judgment normal.  Vitals reviewed.  LABORATORY DATA: Lab Results  Component Value Date   WBC 5.6 09/07/2023   HGB 9.5 (L) 09/07/2023   HCT 29.1 (L) 09/07/2023   MCV 88.2 09/07/2023   PLT 202 09/07/2023      Chemistry      Component Value Date/Time   NA 134 (L) 09/07/2023 1226   NA 137 11/05/2022 1152   K 4.7 09/07/2023 1226   CL 104 09/07/2023 1226   CO2 26 09/07/2023 1226   BUN 22 09/07/2023 1226   BUN 11  11/05/2022 1152   CREATININE 1.68 (H) 09/07/2023 1226      Component Value Date/Time   CALCIUM 9.3 09/07/2023 1226   ALKPHOS 128 (H) 09/07/2023 1226   AST 39 09/07/2023 1226   ALT 22 09/07/2023 1226   BILITOT 0.6 09/07/2023 1226       RADIOGRAPHIC STUDIES:  VAS [place] LOWER EXTREMITY VENOUS (DVT) Result Date: 09/08/2023  Lower Venous DVT Study Patient Name:  ESTIVEN KOHAN  Date of Exam:   09/08/2023 Medical Rec #: [id number]        Accession #:    [id number] Date of Birth: 12-Aug-1955  Patient Gender: M Patient Age:   59 years Exam Location:  [facility] Procedure:      VAS [place] LOWER EXTREMITY VENOUS (DVT) Referring Phys: [name] --------------------------------------------------------------------------------  Indications: 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  Performing Technologist: Dorthula Matas RVS, RCS  Examination Guidelines: A complete evaluation includes B-mode imaging, spectral Doppler, color Doppler, and power Doppler as needed of all accessible portions of each vessel. Bilateral testing is considered an integral part of a complete examination. Limited examinations for reoccurring indications may be performed as noted. The reflux portion of the exam is performed with the patient in reverse Trendelenburg.  +---------+---------------+---------+-----------+---------------+--------------+ LEFT     CompressibilityPhasicitySpontaneityProperties     Thrombus Aging +---------+---------------+---------+-----------+---------------+--------------+ CFV      None                               partially      Age                                                        re-cannalized  Indeterminate  +---------+---------------+---------+-----------+---------------+--------------+ SFJ      Full                               partially      Age                                                         re-cannalized  Indeterminate  +---------+---------------+---------+-----------+---------------+--------------+ FV Prox  Partial                            partially      Age                                                        re-cannalized  Indeterminate  +---------+---------------+---------+-----------+---------------+--------------+ FV Mid   Partial                            partially      Age                                                        re-cannalized  Indeterminate  +---------+---------------+---------+-----------+---------------+--------------+ FV DistalPartial                            partially      Age  re-cannalized  Indeterminate  +---------+---------------+---------+-----------+---------------+--------------+ POP      None                               partially      Age                                                        re-cannalized  Indeterminate  +---------+---------------+---------+-----------+---------------+--------------+ PTV      Full                                                             +---------+---------------+---------+-----------+---------------+--------------+ PERO     Partial                                           Age                                                                       Indeterminate  +---------+---------------+---------+-----------+---------------+--------------+     Summary: LEFT: - Findings consistent with age indeterminate deep vein thrombosis involving the left common femoral vein, SF junction, femoral vein, popliteal vein, and peroneal veins. Thrombus appears to begin at the junction of the distal external iliac vein and the proximal common femoral vein. The left common iliac vein is widely patent.  *See table(s) above for measurements and observations. Electronically signed by Sherald Hess MD on  09/08/2023 at 8:56:57 AM.    Final    VAS [place] IVC/ILIAC (VENOUS ONLY) Result Date: 09/08/2023 IVC/ILIAC STUDY Patient Name:  JARMARCUS WAMBOLD  Date of Exam:   09/08/2023 Medical Rec #: [id number]        Accession #:    [id number] Date of Birth: 12-25-1955        Patient Gender: M Patient Age:   63 years Exam Location:  [facility] Procedure:      VAS [place] IVC/ILIAC (VENOUS ONLY) Referring Phys: [name] --------------------------------------------------------------------------------  Indications: Iliac vein stent evaluation Vascular Interventions: 06/26/2023 Left lower extremity iliofemoral DVT                         May-Thurner syndrome. Limitations: Air/bowel gas.  Performing Technologist: Dorthula Matas RVS, RCS  Examination Guidelines: A complete evaluation includes B-mode imaging, spectral Doppler, color Doppler, and power Doppler as needed of all accessible portions of each vessel. Bilateral testing is considered an integral part of a complete examination. Limited examinations for reoccurring indications may be performed as noted.  IVC/Iliac Findings: +----------+------+--------+--------+    IVC    PatentThrombusComments +----------+------+--------+--------+ IVC Mid   patent                 +----------+------+--------+--------+ IVC Distalpatent                 +----------+------+--------+--------+  +-------------------+---------+-----------+---------+-----------+--------+  CIV        RT-PatentRT-ThrombusLT-PatentLT-ThrombusComments +-------------------+---------+-----------+---------+-----------+--------+ Common Iliac Prox                       patent                      +-------------------+---------+-----------+---------+-----------+--------+ Common Iliac Mid                        patent                      +-------------------+---------+-----------+---------+-----------+--------+ Common Iliac Distal                     patent                       +-------------------+---------+-----------+---------+-----------+--------+  +-------------------------+---------+-----------+---------+-----------+--------+            EIV           RT-PatentRT-ThrombusLT-PatentLT-ThrombusComments +-------------------------+---------+-----------+---------+-----------+--------+ External Iliac Vein Prox                      patent                      +-------------------------+---------+-----------+---------+-----------+--------+ External Iliac Vein Mid                       patent                      +-------------------------+---------+-----------+---------+-----------+--------+ External Iliac Vein                           patent                      Distal                                                                    +-------------------------+---------+-----------+---------+-----------+--------+  Summary: IVC/Iliac: No evidence of thrombus in IVC and left Iliac veins. Widely patent common iliac vein stenting. Of note: Right CIA measures 1.40 x 1.48 cm Left CIA measures 1.44 x 1.48 cm *See table(s) above for measurements and observations.  Electronically signed by Sherald Hess MD on 09/08/2023 at 8:56:22 AM.    Final    CT CHEST ABDOMEN PELVIS W CONTRAST Result Date: 08/28/2023 CLINICAL DATA:  Lung cancer, metastatic disease evaluation. * Tracking Code: BO * EXAM: CT CHEST, ABDOMEN, AND PELVIS WITH CONTRAST TECHNIQUE: Multidetector CT imaging of the chest, abdomen and pelvis was performed following the standard protocol during bolus administration of intravenous contrast. RADIATION DOSE REDUCTION: This exam was performed according to the departmental dose-optimization program which includes automated exposure control, adjustment of the mA and/or kV according to patient size and/or use of iterative reconstruction technique. CONTRAST:  80mL OMNIPAQUE IOHEXOL 300 MG/ML  SOLN COMPARISON:  CT chest 06/24/2023 and PET  12/09/2022. FINDINGS: CT CHEST FINDINGS Cardiovascular: Heart is at the upper limits of normal in size to mildly enlarged. No pericardial effusion. Ascending aorta measures 4.0 cm (coronal image 59), stable. Mediastinum/Nodes: Thoracic inlet lymph nodes are not enlarged  by CT size criteria. No pathologically enlarged mediastinal, hilar or axillary lymph nodes. Fluid and air in the esophagus can be seen with dysmotility. Lungs/Pleura: Left upper lobectomy. Scarring in the anterior left lower lobe. Similar mucoid impaction in the posterior left lower lobe (4/50). Moderate loculated left pleural effusion, similar. Airway is otherwise unremarkable. Musculoskeletal: Degenerative changes in the spine. No worrisome lytic or sclerotic lesions. CT ABDOMEN PELVIS FINDINGS Hepatobiliary: Liver and gallbladder are unremarkable. No biliary ductal dilatation. Pancreas: Negative. Spleen: Negative. Adrenals/Urinary Tract: Right adrenal gland is unremarkable. 1.7 cm left adrenal nodule, shown to represent an adenoma on 12/09/2022. No specific follow-up necessary. Subcentimeter low-attenuation lesions in the kidneys, too small to characterize. No specific follow-up necessary. Scarring in the left kidney. Ureters are decompressed. Bladder is grossly unremarkable. Stomach/Bowel: Stomach, small bowel, appendix and colon are unremarkable. Vascular/Lymphatic: Atherosclerotic calcification of the aorta. No pathologically enlarged lymph nodes. Left common iliac vein stent. Reproductive: Prostate is visualized. Other: No free fluid.  Mesenteries and peritoneum are unremarkable. Musculoskeletal: Degenerative changes in the spine. IMPRESSION: 1. Postsurgical/post treatment changes in the left hemithorax. No evidence of recurrent or metastatic disease. 2. Stable mucoid impaction in the posterior left upper lobe. This can be reassessed on routine follow-up imaging. 3. Moderate loculated left pleural effusion, unchanged. 4. 4.0 cm ascending  aortic aneurysm, stable. Recommend annual imaging followup by CTA or MRA. This recommendation follows 2010 ACCF/AHA/AATS/ACR/ASA/SCA/SCAI/SIR/STS/SVM Guidelines for the Diagnosis and Management of Patients with Thoracic Aortic Disease. Circulation. 2010; 121: E952-W413. Aortic aneurysm NOS (ICD10-I71.9). 5. Left adrenal adenoma. 6.  Aortic atherosclerosis (ICD10-I70.0). Electronically Signed   By: Leanna Battles M.D.   On: 08/28/2023 10:53     ASSESSMENT/PLAN:  This is a very pleasant 68 year old African American Male with stage IIA (T2b, N0, M0) non-small cell lung cancer, adenocarcinoma. He presented with a left upper lobe lesion. He was diagnosed in 2024. He is status post surgical resection by Dr. Cliffton Asters on March 18, 2023. Negative for any actionable mutations. PDL1 expression 6%.    He completed 4 cycles of adjuvant chemotherapy with carboplatin for an AUC of 5 and Alimta. He was not a good canidate for cisplatin due to his CKD. His last dose of treatment was on 07/07/23.[name] He tolerated it well except for fatigue and hiccups with cycle #1.  He also had some neutropenia and required G-CSF injections with Zarxio x 2 with cycle #1.   The patient was admitted for PE and DVT early January 2025.  He is currently on Eliquis.  The patient understands that this will be a continuous medication at this point in time.    The patient has had anemia, which predates his chemotherapy. He has required supportive care/blood transfusions. He was supposed to have a colonoscopy in January but this had to be rescheduled due to a hospitalization.   He had a blood transfusion last week. His labs performed yesterday included CBC, CMP, iron studies, B12, ferritin, and folate. Since he has not had repeat myeloma labs performed in over 6 months, we will recheck these today.   I reviewed his symptoms with Dr. Arbutus Ped.  It sounds like the patient may have had viral gastroenteritis last week which has since resolved.  He  may be dehydrated at this time.  He is no longer having loose stools and is back to his baseline constipation.  Therefore no C. difficile testing or GI pathogen testing is needed at this time.   We will arrange for him to receive  1 L of IV fluids today.  He is not having any URI symptoms.  He can consider performing a home COVID test due to his symptoms although my suspicion for COVID is low but I have had other cases where the only symptom was weakness.    Regarding his labs from yesterday, his ferritin is elevated which is likely secondary to being an acute phase reactant.  His kidney function shows some degree of dehydration.  He does not need a blood transfusion.  His other vitamin studies did not show any deficiencies.  His myeloma labs from today are pending.  If there are concerns, I will call him for further instructions.  Will be in touch with gastroenterology about rescheduling his colonoscopy to further evaluate his anemia to ensure no bleeding. We will arrange 1L of IVF due to poor p.o. intake and possible dehydration.    I will arrange for standing orders for sample blood bank's and we will arrange for a blood transfusion if his hemoglobin is less than 8.    I let the patient know that should he have any worsening signs and symptoms of anemia were always happy to arrange for same-day lab appointments.  They are wondering if we can monitor his anemia to ensure he does not need transfusion support.  I will add monthly lab draws on Mondays with CBC and sample of blood bank.   The patient understands he is always welcome to call us sooner if he ever has worsening symptoms of anemia needs to be assessed for future blood transfusion.  Let the patient know should he develop any new or worsening symptoms or signs of infection to call for reevaluation and return precautions were reviewed.   The patient was advised to call immediately if he has any concerning symptoms in the interval. The  patient voices understanding of current disease status and treatment options and is in agreement with the current care plan. All questions were answered. The patient knows to call the clinic with any problems, questions or concerns. We can certainly see the patient much sooner if necessary           Orders Placed This Encounter  Procedures   CBC with Differential (Cancer Center Only)    Standing Status:   Standing    Number of Occurrences:   5    Expiration Date:   09/07/2024      The total time spent in the appointment was 30-39 minutes  Harjit Leider L Shylo Dillenbeck, PA-C 09/08/23

## 2023-09-07 NOTE — Telephone Encounter (Signed)
 Spoke with patients wife this morning. She reports that patient is "not eating and drinking a lot,  he can't do too much because he's overly tired, and stomach is unsettled." Informed wife that I would check with symptom management for patient to be seen with labs before.  Patient called and reports not eating a lot because he feels full and bloated, but no pain. Symptoms has been going on for a few weeks now.   Informed patients wife that Symptom management does not have any openings today, but would still like the patient to get labs drew. Once labs are resulted, will call with recommendations.

## 2023-09-07 NOTE — Telephone Encounter (Signed)
accident

## 2023-09-07 NOTE — Telephone Encounter (Signed)
 Spoke with patient and offered 1 pm lab, 130 pm visit with Cassie, PA and infusion at 230 tomorrow for IVF's. Informed patient that multiple myeloma labs only will be drew tomorrow. Patient verbalized understanding and was told to call if any new concerns.

## 2023-09-07 NOTE — Telephone Encounter (Signed)
   Chief Complaint: generalized weakness Symptoms: weak, no appetite;  Frequency: constant   Disposition: [x] ED /[] Urgent Care (no appt availability in office) / [] Appointment(In office/virtual)/ []  [facility]/ [] Home Care/ [] Refused Recommended Disposition /[] [facility]/ []  Follow-up with PCP Additional Notes: Pt wife Burna Mortimer calling with concerns of generalized weakness that last Tuesday after getting one unit of blood. Pt also ran a fever and had diarrhea for 24 hours. Burna Mortimer has to make pt drink and eating is minimal. Urinated this morning. Burna Mortimer stated he has to take lots of breaks to do 'easy stuff'. Pt doesn't have one sided weakness, SOB, or chest pains. RN suggested ED and wife is against that. RN called  [facility] to see if pt could be seen today as no appts available. Office nurse is talking to Dr Allyne Gee and stated she will call Burna Mortimer back before lunch. RN notified wanda  of this and she verbalized understanding.       Copied from CRM [id number]. Topic: Clinical - Red Word Triage >> Sep 07, 2023 11:07 AM DeAngela L wrote: Red Word that prompted transfer to Nurse Triage: Patient wife called and states the patient has not eaten in the last few days and is very weak and low energy and has no energy to do anything Reason for Disposition  [1] Drinking very little AND [2] dehydration suspected (e.g., no urine > 12 hours, very dry mouth, very lightheaded)  Answer Assessment - Initial Assessment Questions 1. DESCRIPTION: "Describe how you are feeling."     No appetite; not feeling well  2. SEVERITY: "How bad is it?"  "Can you stand and walk?"   - MILD (0-3): Feels weak or tired, but does not interfere with work, school or normal activities.   - MODERATE (4-7): Able to stand and walk; weakness interferes with work, school, or normal activities.   - SEVERE (8-10): Unable to stand or walk; unable to do usual activities.     Moderate  3. ONSET: "When did these symptoms begin?"  (e.g., hours, days, weeks, months)     3/11 4. CAUSE: "What do you think is causing the weakness or fatigue?" (e.g., not drinking enough fluids, medical problem, trouble sleeping)     Blood transfusion on 3/11  5. NEW MEDICINES:  "Have you started on any new medicines recently?" (e.g., opioid pain medicines, benzodiazepines, muscle relaxants, antidepressants, antihistamines, neuroleptics, beta blockers)    Denies  6. OTHER SYMPTOMS: "Do you have any other symptoms?" (e.g., chest pain, fever, cough, SOB, vomiting, diarrhea, bleeding, other areas of pain)     Little appetite, diarrhea  Protocols used: Weakness (Generalized) and Fatigue-A-AH

## 2023-09-08 ENCOUNTER — Inpatient Hospital Stay

## 2023-09-08 ENCOUNTER — Ambulatory Visit: Payer: Medicare Other | Admitting: Physician Assistant

## 2023-09-08 ENCOUNTER — Ambulatory Visit (HOSPITAL_COMMUNITY)
Admission: RE | Admit: 2023-09-08 | Discharge: 2023-09-08 | Disposition: A | Discharge status: Home / Self Care | Payer: Medicare Other | Source: Ambulatory Visit | Attending: Vascular Surgery | Admitting: Vascular Surgery

## 2023-09-08 ENCOUNTER — Encounter: Payer: Self-pay | Admitting: Internal Medicine

## 2023-09-08 ENCOUNTER — Ambulatory Visit (INDEPENDENT_AMBULATORY_CARE_PROVIDER_SITE_OTHER)
Admission: RE | Admit: 2023-09-08 | Discharge: 2023-09-08 | Disposition: A | Discharge status: Home / Self Care | Payer: Medicare Other | Source: Ambulatory Visit | Attending: Vascular Surgery

## 2023-09-08 ENCOUNTER — Inpatient Hospital Stay (HOSPITAL_BASED_OUTPATIENT_CLINIC_OR_DEPARTMENT_OTHER): Admitting: Physician Assistant

## 2023-09-08 VITALS — BP 146/82 | HR 77 | Temp 97.7°F | Resp 16 | Wt 227.9 lb

## 2023-09-08 VITALS — BP 146/80 | HR 68 | Temp 97.8°F | Ht 75.0 in | Wt 226.3 lb

## 2023-09-08 DIAGNOSIS — C3492 Malignant neoplasm of unspecified part of left bronchus or lung: Secondary | ICD-10-CM | POA: Diagnosis not present

## 2023-09-08 DIAGNOSIS — C3412 Malignant neoplasm of upper lobe, left bronchus or lung: Secondary | ICD-10-CM | POA: Diagnosis not present

## 2023-09-08 DIAGNOSIS — E860 Dehydration: Secondary | ICD-10-CM | POA: Diagnosis not present

## 2023-09-08 DIAGNOSIS — D472 Monoclonal gammopathy: Secondary | ICD-10-CM | POA: Diagnosis not present

## 2023-09-08 DIAGNOSIS — N189 Chronic kidney disease, unspecified: Secondary | ICD-10-CM | POA: Diagnosis not present

## 2023-09-08 DIAGNOSIS — I82422 Acute embolism and thrombosis of left iliac vein: Secondary | ICD-10-CM

## 2023-09-08 DIAGNOSIS — I82402 Acute embolism and thrombosis of unspecified deep veins of left lower extremity: Secondary | ICD-10-CM | POA: Diagnosis not present

## 2023-09-08 DIAGNOSIS — I2699 Other pulmonary embolism without acute cor pulmonale: Secondary | ICD-10-CM | POA: Diagnosis not present

## 2023-09-08 DIAGNOSIS — D649 Anemia, unspecified: Secondary | ICD-10-CM

## 2023-09-08 DIAGNOSIS — Z7901 Long term (current) use of anticoagulants: Secondary | ICD-10-CM | POA: Diagnosis not present

## 2023-09-08 MED ORDER — SODIUM CHLORIDE 0.9 % IV SOLN
Freq: Once | INTRAVENOUS | Status: AC
Start: 2023-09-08 — End: 2023-09-08

## 2023-09-08 NOTE — Progress Notes (Unsigned)
 Requested by:  [name], MD [address] [place],  [place] [address]  Reason for consultation: ***    History of Present Illness   William Mcguire is a 68 y.o. (Aug 18, 1955) male who presents for evaluation of ***  Venous symptoms include: (aching, heavy, tired, throbbing, burning, itching, swelling, bleeding, ulcer)  *** Onset/duration:  ***  Occupation:  *** Aggravating factors: (sitting, standing) Alleviating factors: (elevation) Compression:  *** Helps:  *** Pain medications:  *** Previous vein procedures:  *** History of DVT:  ***  Past Medical History:  Diagnosis Date   Diabetes mellitus    High cholesterol    Hypertension     Past Surgical History:  Procedure Laterality Date   BRONCHIAL BIOPSY  01/05/2023   Procedure: BRONCHIAL BIOPSIES;  Surgeon: Josephine Igo, DO;  Location: [facility];  Service: Pulmonary;;   BRONCHIAL BRUSHINGS  01/05/2023   Procedure: BRONCHIAL BRUSHINGS;  Surgeon: Josephine Igo, DO;  Location: [facility];  Service: Pulmonary;;   BRONCHIAL NEEDLE ASPIRATION BIOPSY  01/05/2023   Procedure: BRONCHIAL NEEDLE ASPIRATION BIOPSIES;  Surgeon: Josephine Igo, DO;  Location: [facility];  Service: Pulmonary;;   INTERCOSTAL NERVE BLOCK Left 03/18/2023   Procedure: INTERCOSTAL NERVE BLOCK;  Surgeon: Corliss Skains, MD;  Location: [facility];  Service: Thoracic;  Laterality: Left;   KNEE SURGERY Left 2004   torn cartilage   LYMPH NODE BIOPSY Left 03/18/2023   Procedure: LYMPH NODE BIOPSY;  Surgeon: Corliss Skains, MD;  Location: [facility];  Service: Thoracic;  Laterality: Left;   PATELLAR TENDON REPAIR  06/11/2011   Procedure: PATELLA TENDON REPAIR;  Surgeon: Cammy Copa;  Location: [facility];  Service: Orthopedics;  Laterality: Right;   PERIPHERAL VASCULAR INTERVENTION  06/26/2023   Procedure: PERIPHERAL VASCULAR INTERVENTION;  Surgeon: Leonie Douglas, MD;  Location: [facility];  Service: Cardiovascular;;    PERIPHERAL VASCULAR THROMBECTOMY Left 06/26/2023   Procedure: PERIPHERAL VASCULAR THROMBECTOMY;  Surgeon: Leonie Douglas, MD;  Location: [facility];  Service: Cardiovascular;  Laterality: Left;   PERIPHERAL VASCULAR ULTRASOUND/IVUS  06/26/2023   Procedure: Peripheral Vascular Ultrasound/IVUS;  Surgeon: Leonie Douglas, MD;  Location: [facility] INVAS[facility] CV LAB;  Service: Cardiovascular;;    Social History   Socioeconomic History   Marital status: Married    Spouse name: Not on file   Number of children: Not on file   Years of education: Not on file   Highest education level: Not on file  Occupational History   Not on file  Tobacco Use   Smoking status: Never   Smokeless tobacco: Never   Tobacco comments:    n/a  Vaping Use   Vaping status: Never Used  Substance and Sexual Activity   Alcohol use: No   Drug use: No   Sexual activity: Not on file  Other Topics Concern   Not on file  Social History Narrative   Not on file   Social Drivers of Health   Financial Resource Strain: Low Risk  (11/05/2022)   Overall Financial Resource Strain (CARDIA)    Difficulty of Paying Living Expenses: Not hard at all  Food Insecurity: No Food Insecurity (06/30/2023)   Hunger Vital Sign    Worried About Running Out of Food in the Last Year: Never true    Ran Out of Food in the Last Year: Never true  Transportation Needs: No Transportation Needs (06/30/2023)   PRAPARE - Transportation    Lack of  Transportation (Medical): No    Lack of Transportation (Non-Medical): No  Physical Activity: Sufficiently Active (11/05/2022)   Exercise Vital Sign    Days of Exercise per Week: 7 days    Minutes of Exercise per Session: 60 min  Stress: No Stress Concern Present (11/05/2022)   [employer] of Occupational Health - Occupational Stress Questionnaire    Feeling of Stress : Not at all  Social Connections: Moderately Integrated (06/25/2023)   Social Connection and Isolation Panel [NHANES]    Frequency of  Communication with Friends and Family: More than three times a week    Frequency of Social Gatherings with Friends and Family: More than three times a week    Attends Religious Services: More than 4 times per year    Active Member of [employer] or Organizations: No    Attends Banker Meetings: Never    Marital Status: Married  Catering manager Violence: Not At Risk (06/30/2023)   Humiliation, Afraid, Rape, and Kick questionnaire    Fear of Current or Ex-Partner: No    Emotionally Abused: No    Physically Abused: No    Sexually Abused: No   *** Family History  Problem Relation Age of Onset   Hypertension Mother    Multiple myeloma Mother    Hypertension Father    Diabetes Father     Current Outpatient Medications  Medication Sig Dispense Refill   amLODipine (NORVASC) 10 MG tablet TAKE 1 TABLET BY MOUTH EVERY DAY 90 tablet 1   apixaban (ELIQUIS) 5 MG TABS tablet Take 1 tablet (5 mg total) by mouth 2 (two) times daily. 60 tablet 2   aspirin EC 81 MG tablet Take 81 mg by mouth daily. Swallow whole.     atorvastatin (LIPITOR) 40 MG tablet Take 1 tablet (40 mg total) by mouth daily. 90 tablet 1   B-D ULTRAFINE III SHORT PEN 31G X 8 MM MISC Inject into the skin as directed.     cyanocobalamin (VITAMIN B12) 1000 MCG/ML injection INJECT 1 ML (1,000 MCG TOTAL) INTO THE MUSCLE EVERY 30 DAYS. 9 mL 1   dexamethasone (DECADRON) 4 MG tablet Take 1 tablet twice a day the day before, the day of, and the day after chemotherapy (Patient not taking: Reported on 09/08/2023) 40 tablet 2   Dulaglutide (TRULICITY) 1.5 MG/0.5ML SOAJ Inject 1.5 mg into the skin every Sunday.     enalapril (VASOTEC) 20 MG tablet TAKE 1 TABLET TWICE DAILY. 90 tablet 1   folic acid (FOLVITE) 1 MG tablet TAKE 1 TABLET BY MOUTH EVERY DAY 90 tablet 1   glucose blood (ONETOUCH VERIO) test strip Use as instructed to check blood sugars twice daily E11.69 100 each 2   glucose blood test strip Use as instructed to test blood  sugar 3 times a day. Dx code: e11.65 100 each 2   insulin glargine (LANTUS) 100 UNIT/ML injection Inject 0.16 mLs (16 Units total) into the skin at bedtime. (Patient taking differently: Inject 19 Units into the skin at bedtime.) 10 mL 11   oxyCODONE (OXY IR/ROXICODONE) 5 MG immediate release tablet Take 1 tablet (5 mg total) by mouth every 6 (six) hours as needed for moderate pain. 28 tablet 0   sildenafil (VIAGRA) 100 MG tablet TAKE 1 TABLET BY MOUTH EVERY DAY AS NEEDED (INSURANCE COVERS 10 TAB PER 77DAYS) 10 tablet 5   SYRINGE-NEEDLE, DISP, 3 ML (B-D INTEGRA SYRINGE) 25G X 5/8" 3 ML MISC USE AS DIRECTED 12 each 24   No  current facility-administered medications for this visit.    No Known Allergies  ***REVIEW OF SYSTEMS (negative unless checked):   Cardiac:  []  Chest pain or chest pressure? []  Shortness of breath upon activity? []  Shortness of breath when lying flat? []  Irregular heart rhythm?  Vascular:  []  Pain in calf, thigh, or hip brought on by walking? []  Pain in feet at night that wakes you up from your sleep? []  Blood clot in your veins? []  Leg swelling?  Pulmonary:  []  Oxygen at home? []  Productive cough? []  Wheezing?  Neurologic:  []  Sudden weakness in arms or legs? []  Sudden numbness in arms or legs? []  Sudden onset of difficult speaking or slurred speech? []  Temporary loss of vision in one eye? []  Problems with dizziness?  Gastrointestinal:  []  Blood in stool? []  Vomited blood?  Genitourinary:  []  Burning when urinating? []  Blood in urine?  Psychiatric:  []  Major depression  Hematologic:  []  Bleeding problems? []  Problems with blood clotting?  Dermatologic:  []  Rashes or ulcers?  Constitutional:  []  Fever or chills?  Ear/Nose/Throat:  []  Change in hearing? []  Nose bleeds? []  Sore throat?  Musculoskeletal:  []  Back pain? []  Joint pain? []  Muscle pain?   Physical Examination     Vitals:   09/08/23 0838  BP: (!) 146/80  Pulse: 68   Temp: 97.8 F (36.6 C)  SpO2: 98%  Weight: 226 lb 4.8 oz (102.6 kg)  Height: 6\' 3"  (1.905 m)   Body mass index is 28.29 kg/m.  General:  WDWN in NAD; vital signs documented above Gait: Not observed HENT: WNL, normocephalic Pulmonary: normal non-labored breathing , without Rales, rhonchi,  wheezing Cardiac: {Desc; regular/irreg:14544} HR, without  Murmurs {With/Without:20273} carotid bruit*** Abdomen: soft, NT, no masses Skin: {With/Without:20273} rashes Vascular Exam/Pulses:  Right Left  Radial {Exam; arterial pulse strength 0-4:30167} {Exam; arterial pulse strength 0-4:30167}  Ulnar {Exam; arterial pulse strength 0-4:30167} {Exam; arterial pulse strength 0-4:30167}  Femoral {Exam; arterial pulse strength 0-4:30167} {Exam; arterial pulse strength 0-4:30167}  Popliteal {Exam; arterial pulse strength 0-4:30167} {Exam; arterial pulse strength 0-4:30167}  DP {Exam; arterial pulse strength 0-4:30167} {Exam; arterial pulse strength 0-4:30167}  PT {Exam; arterial pulse strength 0-4:30167} {Exam; arterial pulse strength 0-4:30167}   Extremities: {With/Without:20273} varicose veins, {With/Without:20273} reticular veins, {With/Without:20273} edema, {With/Without:20273} stasis pigmentation, {With/Without:20273} lipodermatosclerosis, {With/Without:20273} ulcers Musculoskeletal: no muscle wasting or atrophy  Neurologic: A&O X 3;  No focal weakness or paresthesias are detected Psychiatric:  The pt has {Desc; normal/abnormal:11317::"Normal"} affect.  Non-invasive Vascular Imaging   BLE Venous Insufficiency Duplex (***):  RLE:  *** DVT and SVT,  *** GSV reflux ***, GSV diameter *** *** SSV reflux ***, *** deep venous reflux  LLE: *** DVT and SVT,  *** GSV reflux ***,  GSV diameter *** *** SSV reflux ***, *** deep venous reflux   Medical Decision Making   William Mcguire is a 68 y.o. male who presents with: ***LE chronic venous insufficiency, ***varicose veins with  complications  Return to clinic in 6 months with left iliac vein and IVC duplex At 31-month mark can discontinue Eliquis and take baby aspirin daily   Allanah Mcfarland Sharin Mons, PA-C [facility] Office: [phone]  09/08/2023, 2:47 PM  Clinic MD: ***

## 2023-09-08 NOTE — Patient Instructions (Signed)
Rehydration, Adult  Rehydration is the replacement of fluids, salts, and minerals in the body (electrolytes) that are lost during dehydration. Dehydration is when there is not enough water or other fluids in the body. This happens when you lose more fluids than you take in. People who are age 70 or older have a higher risk of dehydration than younger adults. This is because in older age, the body: Is less able to maintain the right amount of water. Does not respond to temperature changes as well. Does not get a sense of thirst as easily or quickly. Other causes include: Not drinking enough fluids. This can occur when you are ill, when you forget to drink, or when you are doing activities that require a lot of energy, especially in hot weather. Conditions that cause loss of water or other fluids. These include diarrhea, vomiting, sweating, or urinating a lot. Other illnesses, such as fever or infection. Certain medicines, such as those that remove excess fluid from the body (diuretics). Symptoms of mild or moderate dehydration may include thirst, dry lips and mouth, and dizziness. Symptoms of severe dehydration may include increased heart rate, confusion, fainting, and not urinating. In severe cases, you may need to get fluids through an IV at the hospital. For mild or moderate cases, you can usually rehydrate at home by drinking certain fluids as told by your health care provider. What are the risks? Rehydration is usually safe. Taking in too much fluid (overhydration) can be a problem but is rare. Overhydration can cause an imbalance of electrolytes in the body, kidney failure, fluid in the lungs, or a decrease in salt (sodium) levels in the body. Supplies needed: You will need an oral rehydration solution (ORS) if your health care provider tells you to use one. This is a drink to treat dehydration. It can be found in pharmacies and retail stores. How to rehydrate Fluids Follow instructions from  your health care provider about what to drink. The kind of fluid and the amount you should drink depend on your condition. In general, you should choose drinks that you prefer. If told by your health care provider, drink an ORS. Make an ORS by following instructions on the package. Start by drinking small amounts, about  cup (120 mL) every 5-10 minutes. Slowly increase how much you drink until you have taken in the amount recommended by your health care provider. Drink enough clear fluids to keep your urine pale yellow. If you were told to drink an ORS, finish it first, then start slowly drinking other clear fluids. Drink fluids such as: Water. This includes sparkling and flavored water. Drinking only water can lead to having too little sodium in your body (hyponatremia). Follow the advice of your health care provider. Water from ice chips you suck on. Fruit juice with water added to it(diluted). Sports drinks. Hot or cold herbal teas. Broth-based soups. Coffee. Milk or milk products. Food Follow instructions from your health care provider about what to eat while you rehydrate. Your health care provider may recommend that you slowly begin eating regular foods in small amounts. Eat foods that contain a healthy balance of electrolytes, such as bananas, oranges, potatoes, tomatoes, and spinach. Avoid foods that are greasy or contain a lot of sugar. In some cases, you may get nutrition through a feeding tube that is passed through your nose and into your stomach (nasogastric tube, or NG tube). This may be done if you have uncontrolled vomiting or diarrhea. Drinks to avoid  Certain drinks may make dehydration worse. While you rehydrate, avoid drinking alcohol. How to tell if you are recovering from dehydration You may be getting better if: You are urinating more often than before you started rehydrating. Your urine is pale yellow. Your energy level improves. You vomit less often. You have  diarrhea less often. Your appetite improves or returns to normal. You feel less dizzy or light-headed. Your skin tone and color start to look more normal. Follow these instructions at home: Take over-the-counter and prescription medicines only as told by your health care provider. Do not take sodium tablets. Doing this can lead to having too much sodium in your body (hypernatremia). Contact a health care provider if: You continue to have symptoms of mild or moderate dehydration, such as: Thirst. Dry lips. Slightly dry mouth. Dizziness. Dark urine or less urine than usual. Muscle cramps. You continue to vomit or have diarrhea. Get help right away if: You have symptoms of dehydration that get worse. You have a fever. You have a severe headache. You have been vomiting and have problems, such as: Your vomiting gets worse. Your vomit includes blood or green matter (bile). You cannot eat or drink without vomiting. You have problems with urination or bowel movements, such as: Diarrhea that gets worse. Blood in your stool (feces). This may cause stool to look black and tarry. Not urinating, or urinating only a small amount of very dark urine, within 6-8 hours. You have trouble breathing. You have symptoms that get worse with treatment. These symptoms may be an emergency. Get help right away. Call 911. Do not wait to see if the symptoms will go away. Do not drive yourself to the hospital. This information is not intended to replace advice given to you by your health care provider. Make sure you discuss any questions you have with your health care provider. Document Revised: 10/23/2021 Document Reviewed: 10/21/2021 Elsevier Patient Education  2024 [employer].

## 2023-09-09 ENCOUNTER — Other Ambulatory Visit: Payer: Self-pay

## 2023-09-09 ENCOUNTER — Encounter: Admitting: Physician Assistant

## 2023-09-09 LAB — KAPPA/LAMBDA LIGHT CHAINS
Kappa free light chain: 198 mg/L — ABNORMAL HIGH (ref 3.3–19.4)
Kappa, lambda light chain ratio: 15.59 — ABNORMAL HIGH (ref 0.26–1.65)
Lambda free light chains: 12.7 mg/L (ref 5.7–26.3)

## 2023-09-09 LAB — BETA 2 MICROGLOBULIN, SERUM: Beta-2 Microglobulin: 4.6 mg/L — ABNORMAL HIGH (ref 0.6–2.4)

## 2023-09-10 ENCOUNTER — Other Ambulatory Visit: Payer: Self-pay | Admitting: Physician Assistant

## 2023-09-10 ENCOUNTER — Telehealth: Payer: Self-pay | Admitting: Physician Assistant

## 2023-09-10 ENCOUNTER — Telehealth: Payer: Self-pay

## 2023-09-10 ENCOUNTER — Other Ambulatory Visit: Payer: Self-pay | Admitting: *Deleted

## 2023-09-10 DIAGNOSIS — I82422 Acute embolism and thrombosis of left iliac vein: Secondary | ICD-10-CM

## 2023-09-10 DIAGNOSIS — D472 Monoclonal gammopathy: Secondary | ICD-10-CM

## 2023-09-10 LAB — MULTIPLE MYELOMA PANEL, SERUM
Albumin SerPl Elph-Mcnc: 3.8 g/dL (ref 2.9–4.4)
Albumin/Glob SerPl: 0.7 (ref 0.7–1.7)
Alpha 1: 0.3 g/dL (ref 0.0–0.4)
Alpha2 Glob SerPl Elph-Mcnc: 1.1 g/dL — ABNORMAL HIGH (ref 0.4–1.0)
B-Globulin SerPl Elph-Mcnc: 1.1 g/dL (ref 0.7–1.3)
Gamma Glob SerPl Elph-Mcnc: 3.5 g/dL — ABNORMAL HIGH (ref 0.4–1.8)
Globulin, Total: 6.1 g/dL — ABNORMAL HIGH (ref 2.2–3.9)
IgA: 85 mg/dL (ref 61–437)
IgG (Immunoglobin G), Serum: 4547 mg/dL — ABNORMAL HIGH (ref 603–1613)
IgM (Immunoglobulin M), Srm: 45 mg/dL (ref 20–172)
M Protein SerPl Elph-Mcnc: 3.1 g/dL — ABNORMAL HIGH
Total Protein ELP: 9.9 g/dL — ABNORMAL HIGH (ref 6.0–8.5)

## 2023-09-10 NOTE — Telephone Encounter (Signed)
 I called the patient and talk to him and his wife about his myeloma labs.  His M protein and IgG have increased since last being checked in August 2024.  He also continues to have renal insufficiency, anemia, and elevated protein.  I reviewed with Dr. Arbutus Ped and the recommendation is to repeat a bone marrow biopsy and aspirate.  After discussion the patient and his wife are in agreement.  I have placed the order.  We will tentatively arrange for a follow-up visit in 1 month to review the results as that should be enough time to have his bone marrow biopsy performed and received the results.

## 2023-09-10 NOTE — Telephone Encounter (Signed)
 Patient was identified as falling into the [place] Measure - Diabetes.   Patient was: Appointment scheduled for lab or office visit for A1c.

## 2023-09-11 NOTE — Progress Notes (Unsigned)
 Sterling Big, MD  Alain Marion Approved.  FYI - bone marrow biopsy requests do not require approval as they are essentially non targeted. YOu can just schedule them.  HKM       Previous Messages    ----- Message ----- From: Alain Marion Sent: 09/10/2023   4:58 PM EDT To: Alain Marion; Ir Procedure Requests Subject: CT Biopsy                                      Procedure :CT Biopsy  Reason :hx MGUS and smoldering multiple myeloma. Worsening labs concerning for now meeting criterial for multiple myeloma Dx: Smoldering myeloma  History :CT CHEST ABDOMEN PELVIS W CONTRAST,CT Angio Chest PE W and/or Wo Contrast,  Provider: Heilingoetter, [name], PA-C  Provider contact ; [phone]

## 2023-09-14 ENCOUNTER — Other Ambulatory Visit: Payer: Self-pay | Admitting: Gastroenterology

## 2023-09-14 DIAGNOSIS — Z860109 Personal history of other colon polyps: Secondary | ICD-10-CM | POA: Insufficient documentation

## 2023-09-14 DIAGNOSIS — D649 Anemia, unspecified: Secondary | ICD-10-CM | POA: Insufficient documentation

## 2023-09-14 DIAGNOSIS — Z860100 Personal history of colon polyps, unspecified: Secondary | ICD-10-CM | POA: Insufficient documentation

## 2023-09-22 ENCOUNTER — Other Ambulatory Visit: Payer: Self-pay

## 2023-09-22 ENCOUNTER — Encounter (HOSPITAL_COMMUNITY): Payer: Self-pay | Admitting: Gastroenterology

## 2023-09-23 ENCOUNTER — Other Ambulatory Visit: Payer: Self-pay | Admitting: Internal Medicine

## 2023-09-29 ENCOUNTER — Ambulatory Visit (HOSPITAL_COMMUNITY): Admitting: Anesthesiology

## 2023-09-29 ENCOUNTER — Encounter (HOSPITAL_COMMUNITY)
Admission: RE | Disposition: A | Discharge status: Home / Self Care | Payer: Self-pay | Source: Home / Self Care | Attending: Gastroenterology

## 2023-09-29 ENCOUNTER — Ambulatory Visit (HOSPITAL_BASED_OUTPATIENT_CLINIC_OR_DEPARTMENT_OTHER): Admitting: Anesthesiology

## 2023-09-29 ENCOUNTER — Other Ambulatory Visit: Payer: Self-pay

## 2023-09-29 ENCOUNTER — Ambulatory Visit (HOSPITAL_COMMUNITY)
Admission: RE | Admit: 2023-09-29 | Discharge: 2023-09-29 | Disposition: A | Discharge status: Home / Self Care | Attending: Gastroenterology | Admitting: Gastroenterology

## 2023-09-29 ENCOUNTER — Encounter (HOSPITAL_COMMUNITY): Payer: Self-pay | Admitting: Gastroenterology

## 2023-09-29 DIAGNOSIS — Z85118 Personal history of other malignant neoplasm of bronchus and lung: Secondary | ICD-10-CM | POA: Diagnosis not present

## 2023-09-29 DIAGNOSIS — D6959 Other secondary thrombocytopenia: Secondary | ICD-10-CM | POA: Insufficient documentation

## 2023-09-29 DIAGNOSIS — K317 Polyp of stomach and duodenum: Secondary | ICD-10-CM | POA: Diagnosis not present

## 2023-09-29 DIAGNOSIS — K648 Other hemorrhoids: Secondary | ICD-10-CM | POA: Insufficient documentation

## 2023-09-29 DIAGNOSIS — Z7901 Long term (current) use of anticoagulants: Secondary | ICD-10-CM | POA: Insufficient documentation

## 2023-09-29 DIAGNOSIS — D649 Anemia, unspecified: Secondary | ICD-10-CM | POA: Insufficient documentation

## 2023-09-29 DIAGNOSIS — Z794 Long term (current) use of insulin: Secondary | ICD-10-CM | POA: Insufficient documentation

## 2023-09-29 DIAGNOSIS — Z86718 Personal history of other venous thrombosis and embolism: Secondary | ICD-10-CM | POA: Diagnosis not present

## 2023-09-29 DIAGNOSIS — Z860100 Personal history of colon polyps, unspecified: Secondary | ICD-10-CM | POA: Diagnosis not present

## 2023-09-29 DIAGNOSIS — D123 Benign neoplasm of transverse colon: Secondary | ICD-10-CM

## 2023-09-29 DIAGNOSIS — D509 Iron deficiency anemia, unspecified: Secondary | ICD-10-CM | POA: Diagnosis not present

## 2023-09-29 DIAGNOSIS — D6481 Anemia due to antineoplastic chemotherapy: Secondary | ICD-10-CM | POA: Diagnosis not present

## 2023-09-29 DIAGNOSIS — Z86711 Personal history of pulmonary embolism: Secondary | ICD-10-CM | POA: Diagnosis not present

## 2023-09-29 DIAGNOSIS — D125 Benign neoplasm of sigmoid colon: Secondary | ICD-10-CM | POA: Insufficient documentation

## 2023-09-29 DIAGNOSIS — I10 Essential (primary) hypertension: Secondary | ICD-10-CM | POA: Insufficient documentation

## 2023-09-29 DIAGNOSIS — Z7985 Long-term (current) use of injectable non-insulin antidiabetic drugs: Secondary | ICD-10-CM | POA: Insufficient documentation

## 2023-09-29 DIAGNOSIS — E119 Type 2 diabetes mellitus without complications: Secondary | ICD-10-CM | POA: Diagnosis not present

## 2023-09-29 DIAGNOSIS — K3189 Other diseases of stomach and duodenum: Secondary | ICD-10-CM | POA: Diagnosis not present

## 2023-09-29 DIAGNOSIS — K2289 Other specified disease of esophagus: Secondary | ICD-10-CM | POA: Diagnosis not present

## 2023-09-29 DIAGNOSIS — K644 Residual hemorrhoidal skin tags: Secondary | ICD-10-CM | POA: Diagnosis not present

## 2023-09-29 DIAGNOSIS — K31A11 Gastric intestinal metaplasia without dysplasia, involving the antrum: Secondary | ICD-10-CM | POA: Diagnosis not present

## 2023-09-29 DIAGNOSIS — Z860109 Personal history of other colon polyps: Secondary | ICD-10-CM

## 2023-09-29 DIAGNOSIS — K2950 Unspecified chronic gastritis without bleeding: Secondary | ICD-10-CM | POA: Diagnosis not present

## 2023-09-29 DIAGNOSIS — K31A19 Gastric intestinal metaplasia without dysplasia, unspecified site: Secondary | ICD-10-CM | POA: Diagnosis not present

## 2023-09-29 HISTORY — PX: ESOPHAGOGASTRODUODENOSCOPY: SHX5428

## 2023-09-29 HISTORY — PX: COLONOSCOPY: SHX5424

## 2023-09-29 HISTORY — PX: BIOPSY OF SKIN SUBCUTANEOUS TISSUE AND/OR MUCOUS MEMBRANE: SHX6741

## 2023-09-29 HISTORY — PX: POLYPECTOMY: SHX149

## 2023-09-29 LAB — GLUCOSE, CAPILLARY: Glucose-Capillary: 78 mg/dL (ref 70–99)

## 2023-09-29 SURGERY — EGD (ESOPHAGOGASTRODUODENOSCOPY)
Anesthesia: Monitor Anesthesia Care

## 2023-09-29 MED ORDER — FENTANYL CITRATE (PF) 100 MCG/2ML IJ SOLN
INTRAMUSCULAR | Status: DC | PRN
Start: 2023-09-29 — End: 2023-09-29
  Administered 2023-09-29: 50 ug via INTRAVENOUS

## 2023-09-29 MED ORDER — EPHEDRINE SULFATE-NACL 50-0.9 MG/10ML-% IV SOSY
PREFILLED_SYRINGE | INTRAVENOUS | Status: DC | PRN
  Administered 2023-09-29: 10 mg via INTRAVENOUS

## 2023-09-29 MED ORDER — FENTANYL CITRATE (PF) 100 MCG/2ML IJ SOLN
INTRAMUSCULAR | Status: AC
  Filled 2023-09-29: qty 2

## 2023-09-29 MED ORDER — SODIUM CHLORIDE 0.9% FLUSH: 3.0000 mL | Freq: Two times a day (BID) | INTRAVENOUS | Status: DC

## 2023-09-29 MED ORDER — SODIUM CHLORIDE 0.9% FLUSH: 3.0000 mL | INTRAVENOUS | Status: DC | PRN

## 2023-09-29 MED ORDER — SODIUM CHLORIDE 0.9 % IV SOLN: INTRAVENOUS | Status: DC | PRN

## 2023-09-29 MED ORDER — PHENYLEPHRINE HCL (PRESSORS) 10 MG/ML IV SOLN
INTRAVENOUS | Status: DC | PRN
  Administered 2023-09-29: 80 ug via INTRAVENOUS

## 2023-09-29 MED ORDER — PROPOFOL 500 MG/50ML IV EMUL
INTRAVENOUS | Status: DC | PRN
Start: 2023-09-29 — End: 2023-09-29
  Administered 2023-09-29: 75 ug/kg/min via INTRAVENOUS

## 2023-09-29 MED ORDER — LIDOCAINE 2% (20 MG/ML) 5 ML SYRINGE
INTRAMUSCULAR | Status: DC | PRN
  Administered 2023-09-29: 100 mg via INTRAVENOUS

## 2023-09-29 MED ORDER — PROPOFOL 1000 MG/100ML IV EMUL
INTRAVENOUS | Status: AC
  Filled 2023-09-29: qty 100

## 2023-09-29 NOTE — Anesthesia Preprocedure Evaluation (Signed)
 Anesthesia Evaluation  Patient identified by MRN, date of birth, ID band Patient awake    Reviewed: Allergy & Precautions, NPO status , Patient's Chart, lab work & pertinent test results  Airway Mallampati: II  TM Distance: >3 FB Neck ROM: Full    Dental no notable dental hx.    Pulmonary neg pulmonary ROS   Pulmonary exam normal        Cardiovascular hypertension, Pt. on medications  Rhythm:Regular Rate:Normal     Neuro/Psych negative neurological ROS  negative psych ROS   GI/Hepatic negative GI ROS, Neg liver ROS,,,  Endo/Other  diabetes, Type 2, Insulin Dependent    Renal/GU   negative genitourinary   Musculoskeletal negative musculoskeletal ROS (+)    Abdominal Normal abdominal exam  (+)   Peds  Hematology  (+) Blood dyscrasia, anemia   Anesthesia Other Findings   Reproductive/Obstetrics                             Anesthesia Physical Anesthesia Plan  ASA: 3  Anesthesia Plan: MAC   Post-op Pain Management:    Induction: Intravenous  PONV Risk Score and Plan: 1 and Propofol infusion and Treatment may vary due to age or medical condition  Airway Management Planned: Simple Face Mask and Nasal Cannula  Additional Equipment: None  Intra-op Plan:   Post-operative Plan:   Informed Consent: I have reviewed the patients History and Physical, chart, labs and discussed the procedure including the risks, benefits and alternatives for the proposed anesthesia with the patient or authorized representative who has indicated his/her understanding and acceptance.     Dental advisory given  Plan Discussed with: CRNA  Anesthesia Plan Comments:        Anesthesia Quick Evaluation

## 2023-09-29 NOTE — Anesthesia Postprocedure Evaluation (Signed)
 Anesthesia Post Note  Patient: Adison Jerger  Procedure(s) Performed: EGD (ESOPHAGOGASTRODUODENOSCOPY) COLONOSCOPY POLYPECTOMY, STOMACH AND COLON BIOPSY, SKIN, SUBCUTANEOUS TISSUE, OR MUCOUS MEMBRANE     Patient location during evaluation: PACU Anesthesia Type: MAC Level of consciousness: awake and alert Pain management: pain level controlled Vital Signs Assessment: post-procedure vital signs reviewed and stable Respiratory status: spontaneous breathing, nonlabored ventilation, respiratory function stable and patient connected to nasal cannula oxygen Cardiovascular status: stable and blood pressure returned to baseline Postop Assessment: no apparent nausea or vomiting Anesthetic complications: no   No notable events documented.  Last Vitals:  Vitals:   09/29/23 1130 09/29/23 1140  BP: 129/69 127/69  Pulse: 69 65  Resp: 14 14  Temp:    SpO2: 97% 100%    Last Pain:  Vitals:   09/29/23 1140  TempSrc:   PainSc: 0-No pain                 Earl Lites P Jayron Maqueda

## 2023-09-29 NOTE — Transfer of Care (Signed)
 Immediate Anesthesia Transfer of Care Note  Patient: William Mcguire  Procedure(s) Performed: EGD (ESOPHAGOGASTRODUODENOSCOPY) COLONOSCOPY POLYPECTOMY, STOMACH AND COLON BIOPSY, SKIN, SUBCUTANEOUS TISSUE, OR MUCOUS MEMBRANE  Patient Location: PACU and Endoscopy Unit  Anesthesia Type:MAC  Level of Consciousness: awake, alert , and oriented  Airway & Oxygen Therapy: Patient Spontanous Breathing and Patient connected to nasal cannula oxygen  Post-op Assessment: Report given to RN  Post vital signs: Reviewed and stable  Last Vitals:  Vitals Value Taken Time  BP 106/50 1109  Temp 36.7 1109  Pulse 78 1109  Resp 13 09/29/23 1109  SpO2 100 1109  Vitals shown include unfiled device data.  Last Pain:  Vitals:   09/29/23 0950  TempSrc: Temporal  PainSc: 0-No pain         Complications: No notable events documented.

## 2023-09-29 NOTE — Op Note (Signed)
 [facility] Patient Name: William Mcguire Procedure Date: 09/29/2023 MRN: [id number] Attending MD: Kathi Der , MD, [id number] Date of Birth: 1956/04/03 CSN: [id number] Age: 68 Admit Type: Outpatient Procedure:                Upper GI endoscopy Indications:              Iron deficiency anemia Providers:                Kathi Der, MD, Fransisca Connors, Marja Kays, Technician Referring MD:              Medicines:                Sedation Administered by an Anesthesia Professional Complications:            No immediate complications. Estimated Blood Loss:     Estimated blood loss was minimal. Estimated blood                            loss was minimal. Procedure:                After obtaining informed consent, the endoscope was                            passed under direct vision. Throughout the                            procedure, the patient's blood pressure, pulse, and                            oxygen saturations were monitored continuously. The                            [id number]-H190 ([id number]) Olympus endoscope was introduced                            through the mouth, and advanced to the second part                            of duodenum. The upper GI endoscopy was                            accomplished without difficulty. The patient                            tolerated the procedure well. Scope In: Scope Out: Findings:      The Z-line was regular and was found 40 cm from the incisors.      A few medium-sized blebs with a localized distribution were found in the       distal esophagus.      Two 6 to 10 mm pedunculated and sessile polyps were found in the cardia       and in the gastric antrum. These polyps were removed with a hot snare.       Resection and retrieval were complete. Cardia  polyp was difficult to       remove. 2 different snare were used and polyp was removed in a long       position. Polyps were  retrieved using a Roth net.      Patchy mildly erythematous mucosa was found in the entire examined       stomach. Biopsies were taken with a cold forceps for histology.      The duodenal bulb, first portion of the duodenum and second portion of       the duodenum were normal. Impression:               - Z-line regular, 40 cm from the incisors.                           - Bleb found in the esophagus.                           - Two gastric polyps. Resected and retrieved.                           - Erythematous mucosa in the stomach. Biopsied.                           - Normal duodenal bulb, first portion of the                            duodenum and second portion of the duodenum. Moderate Sedation:      Moderate (conscious) sedation was personally administered by an       anesthesia professional. The following parameters were monitored: oxygen       saturation, heart rate, blood pressure, and response to care. Recommendation:           - Perform a colonoscopy today. Procedure Code(s):        --- Professional ---                           [phone], Esophagogastroduodenoscopy, flexible,                            transoral; with removal of tumor(s), polyp(s), or                            other lesion(s) by snare technique                           [id number], 59, Esophagogastroduodenoscopy, flexible,                            transoral; with biopsy, single or multiple Diagnosis Code(s):        --- Professional ---                           K22.89, Other specified disease of esophagus                           K31.7, Polyp of stomach and duodenum  K31.89, Other diseases of stomach and duodenum                           D50.9, Iron deficiency anemia, unspecified CPT copyright 2022 American Medical Association. All rights reserved. The codes documented in this report are preliminary and upon coder review may  be revised to meet current compliance requirements. Kathi Der, MD Kathi Der, MD 09/29/2023 11:14:35 AM Number of Addenda: 0

## 2023-09-29 NOTE — Op Note (Signed)
 [facility] Patient Name: William Mcguire Procedure Date: 09/29/2023 MRN: [id number] Attending MD: Kathi Der , MD, [id number] Date of Birth: 02/02/1956 CSN: [id number] Age: 68 Admit Type: Outpatient Procedure:                Colonoscopy Indications:              Iron deficiency anemia Providers:                Kathi Der, MD, Fransisca Connors, Marja Kays, Technician, Elmarie Shiley, CRNA Referring MD:              Medicines:                Sedation Administered by an Anesthesia Professional Complications:            No immediate complications. Estimated Blood Loss:     Estimated blood loss was minimal. Procedure:                Pre-Anesthesia Assessment:                           - Prior to the procedure, a History and Physical                            was performed, and patient medications and                            allergies were reviewed. The patient's tolerance of                            previous anesthesia was also reviewed. The risks                            and benefits of the procedure and the sedation                            options and risks were discussed with the patient.                            All questions were answered, and informed consent                            was obtained. Prior Anticoagulants: The patient has                            taken Eliquis (apixaban), last dose was 2 days                            prior to procedure. ASA Grade Assessment: III - A                            patient with severe systemic disease. After  reviewing the risks and benefits, the patient was                            deemed in satisfactory condition to undergo the                            procedure.                           After obtaining informed consent, the colonoscope                            was passed under direct vision. Throughout the                             procedure, the patient's blood pressure, pulse, and                            oxygen saturations were monitored continuously. The                            [id number]-HQ190L ([id number] Olympus colonoscope was                            introduced through the anus and advanced to the the                            terminal ileum, with identification of the                            appendiceal orifice and IC valve. The colonoscopy                            was performed with difficulty. Successful                            completion of the procedure was aided by applying                            abdominal pressure. The patient tolerated the                            procedure well. The quality of the bowel                            preparation was adequate to identify polyps greater                            than 5 mm in size. The terminal ileum, ileocecal                            valve, appendiceal orifice, and rectum were  photographed. Scope In: 10:45:33 AM Scope Out: 11:01:21 AM Scope Withdrawal Time: 0 hours 11 minutes 47 seconds  Total Procedure Duration: 0 hours 15 minutes 48 seconds  Findings:      Skin tags were found on perianal exam.      The terminal ileum appeared normal.      Two sessile polyps were found in the transverse colon. The polyps were       small in size. These polyps were removed with a cold snare. Resection       and retrieval were complete.      A 4 mm polyp was found in the sigmoid colon. The polyp was sessile. The       polyp was removed with a cold snare. Resection and retrieval were       complete.      Internal hemorrhoids were found during retroflexion. The hemorrhoids       were medium-sized. Impression:               - Perianal skin tags found on perianal exam.                           - The examined portion of the ileum was normal.                           - Two small polyps in the transverse colon, removed                             with a cold snare. Resected and retrieved.                           - One 4 mm polyp in the sigmoid colon, removed with                            a cold snare. Resected and retrieved.                           - Internal hemorrhoids. Moderate Sedation:      Moderate (conscious) sedation was personally administered by an       anesthesia professional. The following parameters were monitored: oxygen       saturation, heart rate, blood pressure, and response to care. Recommendation:           - Patient has a contact number available for                            emergencies. The signs and symptoms of potential                            delayed complications were discussed with the                            patient. Return to normal activities tomorrow.                            Written discharge instructions were provided to the  patient.                           - Resume previous diet.                           - Continue present medications.                           - Await pathology results.                           - Repeat colonoscopy date to be determined after                            pending pathology results are reviewed for                            surveillance based on pathology results.                           - Resume Eliquis (apixaban) at prior dose in 2 days. Procedure Code(s):        --- Professional ---                           [phone], Colonoscopy, flexible; with removal of                            tumor(s), polyp(s), or other lesion(s) by snare                            technique Diagnosis Code(s):        --- Professional ---                           K64.8, Other hemorrhoids                           D12.3, Benign neoplasm of transverse colon (hepatic                            flexure or splenic flexure)                           D12.5, Benign neoplasm of sigmoid colon                           K64.4,  Residual hemorrhoidal skin tags                           D50.9, Iron deficiency anemia, unspecified CPT copyright 2022 American Medical Association. All rights reserved. The codes documented in this report are preliminary and upon coder review may  be revised to meet current compliance requirements. Kathi Der, MD Kathi Der, MD 09/29/2023 11:19:18 AM Number of Addenda: 0

## 2023-09-29 NOTE — Anesthesia Procedure Notes (Signed)
 Procedure Name: MAC Date/Time: 09/29/2023 10:20 AM  Performed by: Micki Riley, CRNAPre-anesthesia Checklist: Patient identified, Emergency Drugs available, Suction available, Patient being monitored and Timeout performed Patient Re-evaluated:Patient Re-evaluated prior to induction Oxygen Delivery Method: Simple face mask Preoxygenation: Pre-oxygenation with 100% oxygen Induction Type: IV induction

## 2023-09-29 NOTE — Discharge Instructions (Signed)

## 2023-09-29 NOTE — H&P (Signed)
 Primary Care Physician:  [name], MD Primary Gastroenterologist:  Dr. Marca Ancona  Reason for Visit - outpatient EGD and colon   HPI: Micahel Mcguire is a 68 y.o. male with past medical history of lung cancer here for outpatient EGD and colonoscopy for iron deficiency anemia. Patient denies any GI symptoms.  Denies seeing any blood in the stool or black stool.  Previous GI history Colonoscopy, Dr. Randa Evens, 01/10/2019, surveillance: Internal hemorrhoids, otherwise unremarkable, repeat recommended in 5 years  CT abdomen and pelvis with contrast 07/04/2020, unintentional weight loss of 25 pounds in 2 months: No acute abdominal pelvic findings, 1.9 cm left adrenal adenoma, 2.4 cm ill-defined anterior lingual lesion, recommended  CT chest LUNG, LEFT UPPER LOBE, LOBECTOMY: Invasive mucinous adenocarcinoma with papillary features. Tumor size: 5.0 cm.  EGD 2010: Hyperplastic regenerative polyp, negative for H. 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  Labs 06/01/2023: GFR 43, creatinine 1.71, albumin 3.2, ALP 129, hemoglobin 8.1, MCV 87.8, platelet 104, FOBT x 3 showed 1 sample positive to samples negative Bone marrow biopsy 06/02/2022: Normocytic anemia  Past Medical History:  Diagnosis Date   Diabetes mellitus    High cholesterol    Hypertension     Past Surgical History:  Procedure Laterality Date   BRONCHIAL BIOPSY  01/05/2023   Procedure: BRONCHIAL BIOPSIES;  Surgeon: Josephine Igo, DO;  Location: [facility];  Service: Pulmonary;;   BRONCHIAL BRUSHINGS  01/05/2023   Procedure: BRONCHIAL BRUSHINGS;  Surgeon: Josephine Igo, DO;  Location: [facility];  Service: Pulmonary;;   BRONCHIAL NEEDLE ASPIRATION BIOPSY  01/05/2023   Procedure: BRONCHIAL NEEDLE ASPIRATION BIOPSIES;  Surgeon: Josephine Igo, DO;  Location: [facility] ENDOS[facility];  Service:  Pulmonary;;   INTERCOSTAL NERVE BLOCK Left 03/18/2023   Procedure: INTERCOSTAL NERVE BLOCK;  Surgeon: Corliss Skains, MD;  Location: [facility] OR;  Service: Thoracic;  Laterality: Left;   KNEE SURGERY Left 2004   torn cartilage   LYMPH NODE BIOPSY Left 03/18/2023   Procedure: LYMPH NODE BIOPSY;  Surgeon: Corliss Skains, MD;  Location: [facility] OR;  Service: Thoracic;  Laterality: Left;   PATELLAR TENDON REPAIR  06/11/2011   Procedure: PATELLA TENDON REPAIR;  Surgeon: Cammy Copa;  Location: [facility] ORS;  Service: Orthopedics;  Laterality: Right;   PERIPHERAL VASCULAR INTERVENTION  06/26/2023   Procedure: PERIPHERAL VASCULAR INTERVENTION;  Surgeon: Leonie Douglas, MD;  Location: [facility] INVAS[facility] CV LAB;  Service: Cardiovascular;;   PERIPHERAL VASCULAR THROMBECTOMY Left 06/26/2023   Procedure: PERIPHERAL VASCULAR THROMBECTOMY;  Surgeon: Leonie Douglas, MD;  Location: [facility];  Service: Cardiovascular;  Laterality: Left;   PERIPHERAL VASCULAR ULTRASOUND/IVUS  06/26/2023   Procedure: Peripheral Vascular Ultrasound/IVUS;  Surgeon: Leonie Douglas, MD;  Location: [facility] INVASIVE CV LAB;  Service: Cardiovascular;;    Prior to Admission medications   Medication Sig Start Date End Date Taking? Authorizing Provider  amLODipine (NORVASC) 10 MG tablet TAKE 1 TABLET BY MOUTH EVERY DAY 03/18/23  Yes [name], MD  apixaban (ELIQUIS) 5 MG TABS tablet Take 1 tablet (5 mg total) by mouth 2 (two) times daily. 07/06/23  Yes Heilingoetter, Cassandra L, PA-C  aspirin EC 81 MG tablet Take 81 mg by mouth daily. Swallow whole.   Yes [provider]  atorvastatin (LIPITOR) 40 MG tablet Take 1 tablet (40 mg total) by mouth daily. 07/30/23  Yes [name], MD  cyanocobalamin (VITAMIN B12) 1000 MCG/ML  injection INJECT 1 ML (1,000 MCG TOTAL) INTO THE MUSCLE EVERY 30 DAYS. 12/10/22  Yes [name], MD  Dulaglutide (TRULICITY) 1.5 MG/0.5ML SOAJ Inject 1.5 mg into the skin every Sunday.   Yes [provider]  enalapril (VASOTEC) 20 MG tablet TAKE 1 TABLET BY MOUTH TWICE A DAY 09/23/23  Yes [name], MD  folic acid (FOLVITE) 1 MG tablet TAKE 1 TABLET BY MOUTH EVERY DAY 08/26/23  Yes Heilingoetter, Cassandra L, PA-C  insulin glargine (LANTUS) 100 UNIT/ML injection Inject 0.16 mLs (16 Units total) into the skin at bedtime. Patient taking differently: Inject 19 Units into the skin at bedtime. 06/28/23  Yes [name], MD  sildenafil (VIAGRA) 100 MG tablet TAKE 1 TABLET BY MOUTH EVERY DAY AS NEEDED (INSURANCE COVERS 10 TAB PER 77DAYS) 02/13/23  Yes [name], MD  B-D ULTRAFINE III SHORT PEN 31G X 8 MM MISC Inject into the skin as directed. 06/30/23   [provider]  dexamethasone (DECADRON) 4 MG tablet Take 1 tablet twice a day the day before, the day of, and the day after chemotherapy Patient not taking: Reported on 09/08/2023 04/13/23   Heilingoetter, Cassandra L, PA-C  glucose blood (ONETOUCH VERIO) test strip Use as instructed to check blood sugars twice daily E11.69 06/23/23   [name], MD  glucose blood test strip Use as instructed to test blood sugar 3 times a day. Dx code: e11.65 11/26/21   [name], MD  oxyCODONE (OXY IR/ROXICODONE) 5 MG immediate release tablet Take 1 tablet (5 mg total) by mouth every 6 (six) hours as needed for moderate pain. 03/23/23   Stehler, Oren Bracket, PA-C  SYRINGE-NEEDLE, DISP, 3 ML (B-D INTEGRA SYRINGE) 25G X 5/8" 3 ML MISC USE AS DIRECTED 06/23/23   [name], MD    Scheduled Meds:  sodium chloride flush  3-10 mL Intravenous Q12H   Continuous Infusions: PRN Meds:.sodium chloride flush  Allergies as of 09/14/2023   (No Known Allergies)    Family History  Problem Relation Age of Onset   Hypertension Mother    Multiple myeloma Mother    Hypertension Father    Diabetes Father     Social History   Socioeconomic History   Marital status: Married    Spouse name: Not on file   Number of children: Not on file    Years of education: Not on file   Highest education level: Not on file  Occupational History   Not on file  Tobacco Use   Smoking status: Never   Smokeless tobacco: Never   Tobacco comments:    n/a  Vaping Use   Vaping status: Never Used  Substance and Sexual Activity   Alcohol use: No   Drug use: No   Sexual activity: Not on file  Other Topics Concern   Not on file  Social History Narrative   Not on file   Social Drivers of Health   Financial Resource Strain: Low Risk  (11/05/2022)   Overall Financial Resource Strain (CARDIA)    Difficulty of Paying Living Expenses: Not hard at all  Food Insecurity: No Food Insecurity (06/30/2023)   Hunger Vital Sign    Worried About Running Out of Food in the Last Year: Never true    Ran Out of Food in the Last Year: Never true  Transportation Needs: No Transportation Needs (06/30/2023)   PRAPARE - Administrator, Civil Service (Medical): No    Lack of Transportation (Non-Medical): No  Physical  Activity: Sufficiently Active (11/05/2022)   Exercise Vital Sign    Days of Exercise per Week: 7 days    Minutes of Exercise per Session: 60 min  Stress: No Stress Concern Present (11/05/2022)   [employer] of Occupational Health - Occupational Stress Questionnaire    Feeling of Stress : Not at all  Social Connections: Moderately Integrated (06/25/2023)   Social Connection and Isolation Panel [NHANES]    Frequency of Communication with Friends and Family: More than three times a week    Frequency of Social Gatherings with Friends and Family: More than three times a week    Attends Religious Services: More than 4 times per year    Active Member of [employer] or Organizations: No    Attends Banker Meetings: Never    Marital Status: Married  Catering manager Violence: Not At Risk (06/30/2023)   Humiliation, Afraid, Rape, and Kick questionnaire    Fear of Current or Ex-Partner: No    Emotionally Abused: No    Physically Abused:  No    Sexually Abused: No    Review of Systems: All negative except as stated above in HPI.  Physical Exam: Vital signs: Vitals:   09/29/23 0950  BP: (!) 153/70  Resp: 15  Temp: 97.9 F (36.6 C)  SpO2: 100%     General:   Alert,  Well-developed, well-nourished, pleasant and cooperative in NAD Lungs: No visible respiratory distress Heart:  Regular rate and rhythm; no murmurs, clicks, rubs,  or gallops. Abdomen: Soft, nontender, nondistended, bowel sound present, no peritoneal signs Rectal:  Deferred  GI:  Lab Results: No results for input(s): "WBC", "HGB", "HCT", "PLT" in the last 72 hours. BMET No results for input(s): "NA", "K", "CL", "CO2", "GLUCOSE", "BUN", "CREATININE", "CALCIUM" in the last 72 hours. LFT No results for input(s): "PROT", "ALBUMIN", "AST", "ALT", "ALKPHOS", "BILITOT", "BILIDIR", "IBILI" in the last 72 hours. PT/INR No results for input(s): "LABPROT", "INR" in the last 72 hours.   Studies/Results: No results found.  Impression/Plan: -Iron deficiency anemia -Personal history of lung cancer.  Stage IIa.  Currently followed by oncology. 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  Eliquis on hold for last 2 days.  Recommendations -------------------------- -Proceed with EGD and colonoscopy today.  Risks (bleeding, infection, bowel perforation that could require surgery, sedation-related changes in cardiopulmonary systems), benefits (identification and possible treatment of source of symptoms, exclusion of certain causes of symptoms), and alternatives (watchful waiting, radiographic imaging studies, empiric medical treatment)  were explained to patient/family in detail and patient wishes to proceed.     LOS: 0 days   Kathi Der  MD, FACP 09/29/2023, 9:51 AM  Contact #  [phone]

## 2023-09-30 ENCOUNTER — Encounter (HOSPITAL_COMMUNITY): Payer: Self-pay | Admitting: Gastroenterology

## 2023-09-30 LAB — SURGICAL PATHOLOGY

## 2023-10-05 ENCOUNTER — Inpatient Hospital Stay: Attending: Hematology and Oncology

## 2023-10-05 ENCOUNTER — Other Ambulatory Visit: Payer: Self-pay

## 2023-10-05 ENCOUNTER — Emergency Department (HOSPITAL_COMMUNITY)

## 2023-10-05 ENCOUNTER — Inpatient Hospital Stay (HOSPITAL_COMMUNITY)
Admission: EM | Admit: 2023-10-05 | Discharge: 2023-10-08 | DRG: 871 | Disposition: A | Discharge status: Home / Self Care | Attending: Internal Medicine | Admitting: Internal Medicine

## 2023-10-05 ENCOUNTER — Telehealth: Payer: Self-pay

## 2023-10-05 ENCOUNTER — Encounter (HOSPITAL_COMMUNITY): Payer: Self-pay

## 2023-10-05 DIAGNOSIS — Z794 Long term (current) use of insulin: Secondary | ICD-10-CM | POA: Diagnosis not present

## 2023-10-05 DIAGNOSIS — E7800 Pure hypercholesterolemia, unspecified: Secondary | ICD-10-CM | POA: Diagnosis not present

## 2023-10-05 DIAGNOSIS — E860 Dehydration: Secondary | ICD-10-CM | POA: Diagnosis not present

## 2023-10-05 DIAGNOSIS — Z86711 Personal history of pulmonary embolism: Secondary | ICD-10-CM

## 2023-10-05 DIAGNOSIS — I7121 Aneurysm of the ascending aorta, without rupture: Secondary | ICD-10-CM | POA: Diagnosis not present

## 2023-10-05 DIAGNOSIS — Z902 Acquired absence of lung [part of]: Secondary | ICD-10-CM

## 2023-10-05 DIAGNOSIS — K388 Other specified diseases of appendix: Secondary | ICD-10-CM | POA: Diagnosis not present

## 2023-10-05 DIAGNOSIS — R0902 Hypoxemia: Secondary | ICD-10-CM | POA: Diagnosis not present

## 2023-10-05 DIAGNOSIS — N179 Acute kidney failure, unspecified: Secondary | ICD-10-CM | POA: Diagnosis present

## 2023-10-05 DIAGNOSIS — J9811 Atelectasis: Secondary | ICD-10-CM | POA: Diagnosis not present

## 2023-10-05 DIAGNOSIS — J90 Pleural effusion, not elsewhere classified: Secondary | ICD-10-CM | POA: Diagnosis present

## 2023-10-05 DIAGNOSIS — A419 Sepsis, unspecified organism: Secondary | ICD-10-CM | POA: Diagnosis not present

## 2023-10-05 DIAGNOSIS — J690 Pneumonitis due to inhalation of food and vomit: Secondary | ICD-10-CM | POA: Diagnosis not present

## 2023-10-05 DIAGNOSIS — D631 Anemia in chronic kidney disease: Secondary | ICD-10-CM | POA: Diagnosis not present

## 2023-10-05 DIAGNOSIS — R42 Dizziness and giddiness: Secondary | ICD-10-CM | POA: Diagnosis not present

## 2023-10-05 DIAGNOSIS — Z7901 Long term (current) use of anticoagulants: Secondary | ICD-10-CM

## 2023-10-05 DIAGNOSIS — E785 Hyperlipidemia, unspecified: Secondary | ICD-10-CM | POA: Diagnosis present

## 2023-10-05 DIAGNOSIS — Z86718 Personal history of other venous thrombosis and embolism: Secondary | ICD-10-CM

## 2023-10-05 DIAGNOSIS — Z7985 Long-term (current) use of injectable non-insulin antidiabetic drugs: Secondary | ICD-10-CM

## 2023-10-05 DIAGNOSIS — J984 Other disorders of lung: Secondary | ICD-10-CM | POA: Diagnosis not present

## 2023-10-05 DIAGNOSIS — J189 Pneumonia, unspecified organism: Principal | ICD-10-CM

## 2023-10-05 DIAGNOSIS — R509 Fever, unspecified: Secondary | ICD-10-CM | POA: Diagnosis not present

## 2023-10-05 DIAGNOSIS — E872 Acidosis, unspecified: Secondary | ICD-10-CM | POA: Diagnosis not present

## 2023-10-05 DIAGNOSIS — C3492 Malignant neoplasm of unspecified part of left bronchus or lung: Secondary | ICD-10-CM | POA: Diagnosis present

## 2023-10-05 DIAGNOSIS — E1122 Type 2 diabetes mellitus with diabetic chronic kidney disease: Secondary | ICD-10-CM | POA: Diagnosis present

## 2023-10-05 DIAGNOSIS — Z85118 Personal history of other malignant neoplasm of bronchus and lung: Secondary | ICD-10-CM

## 2023-10-05 DIAGNOSIS — E119 Type 2 diabetes mellitus without complications: Secondary | ICD-10-CM

## 2023-10-05 DIAGNOSIS — Z9221 Personal history of antineoplastic chemotherapy: Secondary | ICD-10-CM

## 2023-10-05 DIAGNOSIS — R112 Nausea with vomiting, unspecified: Secondary | ICD-10-CM | POA: Diagnosis not present

## 2023-10-05 DIAGNOSIS — N1831 Chronic kidney disease, stage 3a: Secondary | ICD-10-CM | POA: Diagnosis not present

## 2023-10-05 DIAGNOSIS — Z8249 Family history of ischemic heart disease and other diseases of the circulatory system: Secondary | ICD-10-CM

## 2023-10-05 DIAGNOSIS — R6520 Severe sepsis without septic shock: Secondary | ICD-10-CM | POA: Diagnosis present

## 2023-10-05 DIAGNOSIS — J869 Pyothorax without fistula: Secondary | ICD-10-CM

## 2023-10-05 DIAGNOSIS — Z1152 Encounter for screening for COVID-19: Secondary | ICD-10-CM | POA: Diagnosis not present

## 2023-10-05 DIAGNOSIS — I129 Hypertensive chronic kidney disease with stage 1 through stage 4 chronic kidney disease, or unspecified chronic kidney disease: Secondary | ICD-10-CM | POA: Diagnosis not present

## 2023-10-05 DIAGNOSIS — Z87440 Personal history of urinary (tract) infections: Secondary | ICD-10-CM

## 2023-10-05 DIAGNOSIS — R059 Cough, unspecified: Secondary | ICD-10-CM | POA: Diagnosis not present

## 2023-10-05 DIAGNOSIS — R000 Tachycardia, unspecified: Secondary | ICD-10-CM | POA: Diagnosis not present

## 2023-10-05 DIAGNOSIS — R1111 Vomiting without nausea: Secondary | ICD-10-CM | POA: Diagnosis not present

## 2023-10-05 DIAGNOSIS — K2970 Gastritis, unspecified, without bleeding: Secondary | ICD-10-CM | POA: Diagnosis present

## 2023-10-05 DIAGNOSIS — Z79899 Other long term (current) drug therapy: Secondary | ICD-10-CM

## 2023-10-05 DIAGNOSIS — Z833 Family history of diabetes mellitus: Secondary | ICD-10-CM

## 2023-10-05 DIAGNOSIS — J939 Pneumothorax, unspecified: Secondary | ICD-10-CM

## 2023-10-05 DIAGNOSIS — C3490 Malignant neoplasm of unspecified part of unspecified bronchus or lung: Secondary | ICD-10-CM

## 2023-10-05 LAB — CBC WITH DIFFERENTIAL/PLATELET
Abs Immature Granulocytes: 0 10*3/uL (ref 0.00–0.07)
Basophils Absolute: 0 10*3/uL (ref 0.0–0.1)
Basophils Relative: 0 %
Eosinophils Absolute: 0 10*3/uL (ref 0.0–0.5)
Eosinophils Relative: 0 %
HCT: 23.7 % — ABNORMAL LOW (ref 39.0–52.0)
Hemoglobin: 7.8 g/dL — ABNORMAL LOW (ref 13.0–17.0)
Lymphocytes Relative: 8 %
Lymphs Abs: 0.4 10*3/uL — ABNORMAL LOW (ref 0.7–4.0)
MCH: 30.2 pg (ref 26.0–34.0)
MCHC: 32.9 g/dL (ref 30.0–36.0)
MCV: 91.9 fL (ref 80.0–100.0)
Monocytes Absolute: 0.1 10*3/uL (ref 0.1–1.0)
Monocytes Relative: 2 %
Neutro Abs: 4.6 10*3/uL (ref 1.7–7.7)
Neutrophils Relative %: 90 %
Platelets: 209 10*3/uL (ref 150–400)
RBC: 2.58 MIL/uL — ABNORMAL LOW (ref 4.22–5.81)
RDW: 15.1 % (ref 11.5–15.5)
WBC: 5.1 10*3/uL (ref 4.0–10.5)
nRBC: 0 % (ref 0.0–0.2)
nRBC: 1 /100{WBCs} — ABNORMAL HIGH

## 2023-10-05 LAB — CMP (CANCER CENTER ONLY)
ALT: 14 U/L (ref 0–44)
AST: 16 U/L (ref 15–41)
Albumin: 3.8 g/dL (ref 3.5–5.0)
Alkaline Phosphatase: 150 U/L — ABNORMAL HIGH (ref 38–126)
Anion gap: 4 — ABNORMAL LOW (ref 5–15)
BUN: 16 mg/dL (ref 8–23)
CO2: 25 mmol/L (ref 22–32)
Calcium: 9.7 mg/dL (ref 8.9–10.3)
Chloride: 106 mmol/L (ref 98–111)
Creatinine: 1.46 mg/dL — ABNORMAL HIGH (ref 0.61–1.24)
GFR, Estimated: 52 mL/min — ABNORMAL LOW (ref >60)
Glucose, Bld: 116 mg/dL — ABNORMAL HIGH (ref 70–99)
Potassium: 4.6 mmol/L (ref 3.5–5.1)
Sodium: 135 mmol/L (ref 135–145)
Total Bilirubin: 0.4 mg/dL (ref 0.0–1.2)
Total Protein: 9.1 g/dL — ABNORMAL HIGH (ref 6.5–8.1)

## 2023-10-05 LAB — COMPREHENSIVE METABOLIC PANEL WITH GFR
ALT: 16 U/L (ref 0–44)
AST: 24 U/L (ref 15–41)
Albumin: 3.2 g/dL — ABNORMAL LOW (ref 3.5–5.0)
Alkaline Phosphatase: 109 U/L (ref 38–126)
Anion gap: 9 (ref 5–15)
BUN: 20 mg/dL (ref 8–23)
CO2: 20 mmol/L — ABNORMAL LOW (ref 22–32)
Calcium: 9.3 mg/dL (ref 8.9–10.3)
Chloride: 107 mmol/L (ref 98–111)
Creatinine, Ser: 1.94 mg/dL — ABNORMAL HIGH (ref 0.61–1.24)
GFR, Estimated: 37 mL/min — ABNORMAL LOW (ref >60)
Glucose, Bld: 129 mg/dL — ABNORMAL HIGH (ref 70–99)
Potassium: 5 mmol/L (ref 3.5–5.1)
Sodium: 136 mmol/L (ref 135–145)
Total Bilirubin: 0.7 mg/dL (ref 0.0–1.2)
Total Protein: 8.7 g/dL — ABNORMAL HIGH (ref 6.5–8.1)

## 2023-10-05 LAB — CBC WITH DIFFERENTIAL (CANCER CENTER ONLY)
Abs Immature Granulocytes: 0.07 10*3/uL (ref 0.00–0.07)
Basophils Absolute: 0 10*3/uL (ref 0.0–0.1)
Basophils Relative: 0 %
Eosinophils Absolute: 0.1 10*3/uL (ref 0.0–0.5)
Eosinophils Relative: 2 %
HCT: 24.6 % — ABNORMAL LOW (ref 39.0–52.0)
Hemoglobin: 8.1 g/dL — ABNORMAL LOW (ref 13.0–17.0)
Immature Granulocytes: 1 %
Lymphocytes Relative: 25 %
Lymphs Abs: 1.6 10*3/uL (ref 0.7–4.0)
MCH: 30 pg (ref 26.0–34.0)
MCHC: 32.9 g/dL (ref 30.0–36.0)
MCV: 91.1 fL (ref 80.0–100.0)
Monocytes Absolute: 0.6 10*3/uL (ref 0.1–1.0)
Monocytes Relative: 10 %
Neutro Abs: 4 10*3/uL (ref 1.7–7.7)
Neutrophils Relative %: 62 %
Platelet Count: 214 10*3/uL (ref 150–400)
RBC: 2.7 MIL/uL — ABNORMAL LOW (ref 4.22–5.81)
RDW: 15 % (ref 11.5–15.5)
WBC Count: 6.4 10*3/uL (ref 4.0–10.5)
nRBC: 0 % (ref 0.0–0.2)

## 2023-10-05 LAB — URINALYSIS, W/ REFLEX TO CULTURE (INFECTION SUSPECTED)
Bacteria, UA: NONE SEEN
Bilirubin Urine: NEGATIVE
Glucose, UA: NEGATIVE mg/dL
Hgb urine dipstick: NEGATIVE
Ketones, ur: NEGATIVE mg/dL
Nitrite: NEGATIVE
Protein, ur: NEGATIVE mg/dL
Specific Gravity, Urine: 1.028 (ref 1.005–1.030)
pH: 5 (ref 5.0–8.0)

## 2023-10-05 LAB — RESP PANEL BY RT-PCR (RSV, FLU A&B, COVID)  RVPGX2
Influenza A by PCR: NEGATIVE
Influenza B by PCR: NEGATIVE
Resp Syncytial Virus by PCR: NEGATIVE
SARS Coronavirus 2 by RT PCR: NEGATIVE

## 2023-10-05 LAB — SAMPLE TO BLOOD BANK

## 2023-10-05 LAB — PROTIME-INR
INR: 1.6 — ABNORMAL HIGH (ref 0.8–1.2)
Prothrombin Time: 18.9 s — ABNORMAL HIGH (ref 11.4–15.2)

## 2023-10-05 LAB — I-STAT CG4 LACTIC ACID, ED
Lactic Acid, Venous: 2.4 mmol/L (ref 0.5–1.9)
Lactic Acid, Venous: 1.2 mmol/L (ref 0.5–1.9)

## 2023-10-05 MED ORDER — IOHEXOL 350 MG/ML SOLN
75.0000 mL | Freq: Once | INTRAVENOUS | Status: AC | PRN
  Administered 2023-10-05: 75 mL via INTRAVENOUS

## 2023-10-05 MED ORDER — SODIUM CHLORIDE 0.9 % IV BOLUS (SEPSIS)
1000.0000 mL | Freq: Once | INTRAVENOUS | Status: AC
  Administered 2023-10-05: 1000 mL via INTRAVENOUS

## 2023-10-05 MED ORDER — SODIUM CHLORIDE 0.9 % IV SOLN
2.0000 g | Freq: Once | INTRAVENOUS | Status: AC
  Administered 2023-10-05: 2 g via INTRAVENOUS
  Filled 2023-10-05: qty 12.5

## 2023-10-05 MED ORDER — LACTATED RINGERS IV SOLN: INTRAVENOUS | Status: DC

## 2023-10-05 MED ORDER — METRONIDAZOLE 500 MG/100ML IV SOLN
500.0000 mg | Freq: Once | INTRAVENOUS | Status: AC
  Administered 2023-10-05: 500 mg via INTRAVENOUS
  Filled 2023-10-05: qty 100

## 2023-10-05 MED ORDER — SODIUM CHLORIDE 0.9 % IV BOLUS
1000.0000 mL | Freq: Once | INTRAVENOUS | Status: AC
  Administered 2023-10-05: 1000 mL via INTRAVENOUS

## 2023-10-05 MED ORDER — ACETAMINOPHEN 325 MG PO TABS
975.0000 mg | ORAL_TABLET | Freq: Once | ORAL | Status: AC
  Administered 2023-10-05: 975 mg via ORAL
  Filled 2023-10-05: qty 3

## 2023-10-05 NOTE — ED Notes (Signed)
 Lactic acid 2.38, results will not cross over from [place] machine. Results shown to Dr

## 2023-10-05 NOTE — ED Notes (Signed)
 Patient transported to CT

## 2023-10-05 NOTE — ED Triage Notes (Signed)
 Went to cancer center to have labs. Pt vomiting, chills, cough, last week had a colonoscopy. Gait issues

## 2023-10-05 NOTE — Telephone Encounter (Signed)
 Tried to reach patient in regards to lab results.  Per providers- patient's hgb 8.1.  LVM with patient informing to call with any new worsening symptoms, otherwise we will see him Wednesday at appt with Dr. [name].

## 2023-10-05 NOTE — ED Provider Notes (Signed)
 [place] EMERGENCY DEPARTMENT AT [facility] Provider Note   CSN: [id number] Arrival date & time: 10/05/23  1478     History  Chief Complaint  Patient presents with   Code Sepsis    William Mcguire is a 68 y.o. male.  With a history of lung cancer, recurrent UTIs, type 2 diabetes, CKD and PE on Eliquis who presents to the ED given concern for infection.  Patient had outpatient laboratory workup performed through cancer center earlier today and was feeling well this morning and yesterday.  He returned home and began to feel nauseous and vomited several times while doing some yard work.  His wife evaluated him at home and he was found to have a fever as well.  No changes in bowel habits.  No chest pain shortness of breath skin infection.  Last chemo was in January.  History of anemia on laboratory workup and is followed in the cancer team.  HPI     Home Medications Prior to Admission medications   Medication Sig Start Date End Date Taking? Authorizing Provider  amLODipine (NORVASC) 10 MG tablet TAKE 1 TABLET BY MOUTH EVERY DAY Patient taking differently: Take 10 mg by mouth in the morning. 03/18/23  Yes [name], MD  apixaban (ELIQUIS) 5 MG TABS tablet Take 1 tablet (5 mg total) by mouth 2 (two) times daily. 07/06/23  Yes Heilingoetter, Cassandra L, PA-C  atorvastatin (LIPITOR) 40 MG tablet Take 1 tablet (40 mg total) by mouth daily. Patient taking differently: Take 40 mg by mouth in the morning. 07/30/23  Yes [name], MD  calcium carbonate (TUMS EX) 750 MG chewable tablet Chew 3 tablets by mouth as needed for heartburn.   Yes [provider]  cyanocobalamin (VITAMIN B12) 1000 MCG/ML injection INJECT 1 ML (1,000 MCG TOTAL) INTO THE MUSCLE EVERY 30 DAYS. Patient taking differently: Inject 1,000 mcg into the muscle every 30 (thirty) days. 12/10/22  Yes [name], MD  Dulaglutide (TRULICITY) 1.5 MG/0.5ML SOAJ Inject 1.5 mg into the skin every Sunday.   Yes  [provider]  enalapril (VASOTEC) 20 MG tablet TAKE 1 TABLET BY MOUTH TWICE A DAY 09/23/23  Yes [name], MD  folic acid (FOLVITE) 1 MG tablet TAKE 1 TABLET BY MOUTH EVERY DAY 08/26/23  Yes Heilingoetter, Cassandra L, PA-C  insulin glargine (LANTUS) 100 UNIT/ML injection Inject 0.16 mLs (16 Units total) into the skin at bedtime. Patient taking differently: Inject 19 Units into the skin at bedtime. 06/28/23  Yes [name], MD  B-D ULTRAFINE III SHORT PEN 31G X 8 MM MISC Inject into the skin as directed. 06/30/23   [provider]  glucose blood (ONETOUCH VERIO) test strip Use as instructed to check blood sugars twice daily E11.69 06/23/23   [name], MD  glucose blood test strip Use as instructed to test blood sugar 3 times a day. Dx code: e11.65 11/26/21   [name], MD  sildenafil (VIAGRA) 100 MG tablet TAKE 1 TABLET BY MOUTH EVERY DAY AS NEEDED (INSURANCE COVERS 10 TAB PER 77DAYS) Patient taking differently: Take 100 mg by mouth as needed for erectile dysfunction. 02/13/23   [name], MD  SYRINGE-NEEDLE, DISP, 3 ML (B-D INTEGRA SYRINGE) 25G X 5/8" 3 ML MISC USE AS DIRECTED 06/23/23   [name], MD      Allergies    Patient has no known allergies.    Review of Systems   Review of Systems  Physical Exam Updated Vital Signs BP [name])  116/55   Pulse (!) 105   Temp 99.5 F (37.5 C) (Oral)   Resp (!) 0   Ht 6\' 3"  (1.905 m)   Wt 98.9 kg   SpO2 98%   BMI 27.25 kg/m  Physical Exam Vitals and nursing note reviewed.  HENT:     Head: Normocephalic and atraumatic.  Eyes:     Pupils: Pupils are equal, round, and reactive to light.  Cardiovascular:     Rate and Rhythm: Normal rate and regular rhythm.  Pulmonary:     Effort: Pulmonary effort is normal.     Breath sounds: Normal breath sounds.  Abdominal:     Palpations: Abdomen is soft.     Tenderness: There is no abdominal tenderness.  Skin:    General: Skin is warm and dry.   Neurological:     General: No focal deficit present.     Mental Status: He is alert and oriented to person, place, and time.     Sensory: No sensory deficit.     Motor: No weakness.  Psychiatric:        Mood and Affect: Mood normal.     ED Results / Procedures / Treatments   Labs (all labs ordered are listed, but only abnormal results are displayed) Labs Reviewed  COMPREHENSIVE METABOLIC PANEL WITH GFR - Abnormal; Notable for the following components:      Result Value   CO2 20 (*)    Glucose, Bld 129 (*)    Creatinine, Ser 1.94 (*)    Total Protein 8.7 (*)    Albumin 3.2 (*)    GFR, Estimated 37 (*)    All other components within normal limits  CBC WITH DIFFERENTIAL/PLATELET - Abnormal; Notable for the following components:   RBC 2.58 (*)    Hemoglobin 7.8 (*)    HCT 23.7 (*)    Lymphs Abs 0.4 (*)    nRBC 1 (*)    All other components within normal limits  PROTIME-INR - Abnormal; Notable for the following components:   Prothrombin Time 18.9 (*)    INR 1.6 (*)    All other components within normal limits  URINALYSIS, W/ REFLEX TO CULTURE (INFECTION SUSPECTED) - Abnormal; Notable for the following components:   APPearance HAZY (*)    Leukocytes,Ua TRACE (*)    All other components within normal limits  I-STAT CG4 LACTIC ACID, ED - Abnormal; Notable for the following components:   Lactic Acid, Venous 2.4 (*)    All other components within normal limits  CULTURE, BLOOD (ROUTINE X 2)  CULTURE, BLOOD (ROUTINE X 2)  RESP PANEL BY RT-PCR (RSV, FLU A&B, COVID)  [id number]  I-STAT CG4 LACTIC ACID, ED    EKG EKG Interpretation Date/Time:  Monday October 05 2023 21:06:35 EDT Ventricular Rate:  102 PR Interval:  210 QRS Duration:  82 QT Interval:  317 QTC Calculation: 413 R Axis:   46  Text Interpretation: Sinus tachycardia Borderline prolonged PR interval Confirmed by Rafael Bun [phone]) on 10/05/2023 11:11:03 PM  Radiology CT CHEST ABDOMEN PELVIS W CONTRAST Result  Date: 10/05/2023 CLINICAL DATA:  Left lung cancer.  Fever with nausea and vomiting. * Tracking Code: BO * EXAM: CT CHEST, ABDOMEN, AND PELVIS WITH CONTRAST TECHNIQUE: Multidetector CT imaging of the chest, abdomen and pelvis was performed following the standard protocol during bolus administration of intravenous contrast. RADIATION DOSE REDUCTION: This exam was performed according to the departmental dose-optimization program which includes automated exposure control, adjustment of the mA and/or  kV according to patient size and/or use of iterative reconstruction technique. CONTRAST:  75mL OMNIPAQUE IOHEXOL 350 MG/ML SOLN COMPARISON:  08/20/2023 FINDINGS: CT CHEST FINDINGS Cardiovascular: Ascending thoracic aortic aneurysm stable at 4.0 cm diameter. Mediastinum/Nodes: Small thyroid nodules measuring up to 1.0 cm in diameter. Not clinically significant; no follow-up imaging recommended (ref: J Am Coll Radiol. 2015 Feb;12(2): 143-50). No pathologic thoracic adenopathy.  Mild gynecomastia. Lungs/Pleura: Left upper lobectomy. Increased patchy airspace opacity scattered in the left lower lobe favoring pneumonia. New gas in the left pleural fluid collection both anteriorly and in the sub pulmonic fluid; if the patient has had recent thoracentesis then this may be incidental pneumothorax component, but otherwise the possibility of early empyema with gas-forming organism might be considered. No pneumomediastinum. Musculoskeletal: Thoracic spondylosis. CT ABDOMEN PELVIS FINDINGS Hepatobiliary: Unremarkable Pancreas: Unremarkable Spleen: Unremarkable Adrenals/Urinary Tract: Stable 1.3 by 1.7 cm left adrenal mass shown to be an adenoma on 12/09/2022. No follow up is indicated. Mild scarring in the left mid kidney anteriorly. Stomach/Bowel: Thickened appendix mildly worsened from previous, up to 1.1 cm in diameter. Cannot exclude appendicitis or appendiceal mucocele. Trace stranding adjacent to the appendiceal tip for example on  image 78 series 3. Vascular/Lymphatic: Left common iliac vein stent noted. No pathologic adenopathy in the abdomen. Reproductive: Unremarkable Other: No supplemental non-categorized findings. Musculoskeletal: Bridging spurring at the sacroiliac joints anteriorly. Lower lumbar spondylosis and degenerative disc disease. IMPRESSION: 1. Increased patchy airspace opacity scattered in the left lower lobe favoring pneumonia. 2. New gas in the left pleural fluid collection both anteriorly and in the subpulmonic fluid; if the patient has had recent thoracentesis then this may be incidental pneumothorax component, but otherwise the possibility of early empyema with gas-forming organism might be considered. No pneumomediastinum observed. 3. Thickened appendix mildly worsened from previous, up to 1.1 cm in diameter. Cannot exclude appendicitis or appendiceal mucocele. Trace stranding adjacent to the appendiceal tip. 4. Stable ascending thoracic aortic aneurysm at 4.0 cm diameter. This can likely be followed in the context of the patient's oncology imaging. Otherwise, recommend annual imaging followup by CTA or MRA. This recommendation follows 2010 ACCF/AHA/AATS/ACR/ASA/SCA/SCAI/SIR/STS/SVM Guidelines for the Diagnosis and Management of Patients with Thoracic Aortic Disease. Circulation. 2010; 121: E454-U981. Aortic aneurysm NOS (ICD10-I71.9) 5. Stable left adrenal adenoma. 6. Mild gynecomastia. 7. Lower lumbar spondylosis and degenerative disc disease. 8. Bridging spurring at the sacroiliac joints anteriorly. These results were called by telephone at the time of interpretation on 10/05/2023 at 10:01 pm to provider [facility] , who verbally acknowledged these results. Electronically Signed   By: Freida Jes M.D.   On: 10/05/2023 22:07   DG Chest 2 View Result Date: 10/05/2023 CLINICAL DATA:  Suspected sepsis.  Fever, nausea, vomiting EXAM: CHEST - 2 VIEW COMPARISON:  05/05/2023.  CT 08/20/2023 FINDINGS: Heart and  mediastinal contours within normal limits. Right lung clear. Volume loss on the left with small loculated left pleural effusion as seen on prior CT. No definite confluent airspace opacity. Postoperative changes on the left. No acute bony abnormality. IMPRESSION: Postoperative changes on the left with volume loss and small loculated left pleural effusion. Electronically Signed   By: Janeece Mechanic M.D.   On: 10/05/2023 21:27    Procedures .Critical Care  Performed by: Sallyanne Creamer, DO Authorized by: Sallyanne Creamer, DO   Critical care provider statement:    Critical care time (minutes):  37   Critical care time was exclusive of:  Teaching time and separately billable procedures and treating  other patients   Critical care was necessary to treat or prevent imminent or life-threatening deterioration of the following conditions:  Sepsis   Critical care was time spent personally by me on the following activities:  Development of treatment plan with patient or surrogate, discussions with consultants, evaluation of patient's response to treatment, examination of patient, ordering and review of laboratory studies, ordering and review of radiographic studies, ordering and performing treatments and interventions, pulse oximetry, re-evaluation of patient's condition and review of old charts   I assumed direction of critical care for this patient from another provider in my specialty: no     Care discussed with: admitting provider   Comments:     Care discussed with pulmonology, general surgery and admitting hospitalist     Medications Ordered in ED Medications  lactated ringers infusion ( Intravenous New Bag/Given 10/05/23 2101)  sodium chloride 0.9 % bolus 1,000 mL (0 mLs Intravenous Stopped 10/05/23 2151)  sodium chloride 0.9 % bolus 1,000 mL (0 mLs Intravenous Stopped 10/05/23 2304)  ceFEPIme (MAXIPIME) 2 g in sodium chloride 0.9 % 100 mL IVPB (0 g Intravenous Stopped 10/05/23 2137)  metroNIDAZOLE  (FLAGYL) IVPB 500 mg (0 mg Intravenous Stopped 10/05/23 2151)  acetaminophen (TYLENOL) tablet 975 mg (975 mg Oral Given 10/05/23 2058)  iohexol (OMNIPAQUE) 350 MG/ML injection 75 mL (75 mLs Intravenous Contrast Given 10/05/23 2036)    ED Course/ Medical Decision Making/ A&P Clinical Course as of 10/05/23 2345  Mon Oct 05, 2023  2155 Call taken from radiology.  CT chest abdomen pelvis shows small left-sided pneumothorax as well as gas within pleural space concerning for empyema.  No pneumomediastinum.  Will reach out to pulmonology  [MP]  2311 Discussed with Dr. Gaynell Face (pulmonology critical care) who recommends admission to medicine with plan for IR guided chest tube tomorrow.  Patient has received cefepime and Flagyl.  No need for an additional antibiotic at this time as this would cover gram-negative's and anaerobes.  MAP has remained above 65 but will provide another 1 L bolus to achieve 30 cc/kg IV fluid bolus and then initiate maintenance fluids thereafter. [MP]  2320 Discussed with admitting hospitalist Dr.Rathore who except patient for admission. [MP]  2341 Messaged Dr. Azucena Cecil (surgery) regarding appendix findings on CAT scan who is busy with a level 1 trauma at this time but will evaluate patient in ED. [MP]    Clinical Course User Index [MP] Royanne Foots, DO                                 Medical Decision Making 68 year old male with history as above including lung cancer not currently on chemotherapy last chemo in January presenting given concern for infection.  Nausea vomiting fever.  Meeting SIRS criteria with fever tachycardia tachypnea upon initial evaluation.  Code sepsis activated.  Will receive IV fluids and broad-spectrum antibiotics.  Most likely source would be intra-abdominal versus urinary.  Will obtain laboratory workup including blood cultures, lactic acid along with CT chest abdomen pelvis to look for thoracic or intra-abdominal infection.  Admission likely.  Will  continue to monitor hemodynamic status.  Not hypotensive at this time  Amount and/or Complexity of Data Reviewed Labs: ordered. Radiology: ordered.  Risk OTC drugs. Prescription drug management. Decision regarding hospitalization.           Final Clinical Impression(s) / ED Diagnoses Final diagnoses:  Community acquired pneumonia of left lung, unspecified part  of lung  Empyema (HCC)  Sepsis, due to unspecified organism, unspecified whether acute organ dysfunction present [facility])    Rx / DC Orders ED Discharge Orders     None         Sallyanne Creamer, DO 10/05/23 2346

## 2023-10-06 ENCOUNTER — Inpatient Hospital Stay (HOSPITAL_COMMUNITY)

## 2023-10-06 ENCOUNTER — Telehealth: Payer: Self-pay | Admitting: Physician Assistant

## 2023-10-06 DIAGNOSIS — K388 Other specified diseases of appendix: Secondary | ICD-10-CM | POA: Diagnosis present

## 2023-10-06 DIAGNOSIS — Z85118 Personal history of other malignant neoplasm of bronchus and lung: Secondary | ICD-10-CM | POA: Diagnosis not present

## 2023-10-06 DIAGNOSIS — N1831 Chronic kidney disease, stage 3a: Secondary | ICD-10-CM | POA: Diagnosis present

## 2023-10-06 DIAGNOSIS — Z794 Long term (current) use of insulin: Secondary | ICD-10-CM | POA: Diagnosis not present

## 2023-10-06 DIAGNOSIS — J90 Pleural effusion, not elsewhere classified: Secondary | ICD-10-CM | POA: Diagnosis present

## 2023-10-06 DIAGNOSIS — E1122 Type 2 diabetes mellitus with diabetic chronic kidney disease: Secondary | ICD-10-CM | POA: Diagnosis present

## 2023-10-06 DIAGNOSIS — R6520 Severe sepsis without septic shock: Secondary | ICD-10-CM | POA: Diagnosis present

## 2023-10-06 DIAGNOSIS — Z902 Acquired absence of lung [part of]: Secondary | ICD-10-CM | POA: Diagnosis not present

## 2023-10-06 DIAGNOSIS — E860 Dehydration: Secondary | ICD-10-CM | POA: Diagnosis present

## 2023-10-06 DIAGNOSIS — E7800 Pure hypercholesterolemia, unspecified: Secondary | ICD-10-CM | POA: Diagnosis present

## 2023-10-06 DIAGNOSIS — D631 Anemia in chronic kidney disease: Secondary | ICD-10-CM | POA: Diagnosis present

## 2023-10-06 DIAGNOSIS — J939 Pneumothorax, unspecified: Secondary | ICD-10-CM

## 2023-10-06 DIAGNOSIS — A419 Sepsis, unspecified organism: Secondary | ICD-10-CM | POA: Diagnosis present

## 2023-10-06 DIAGNOSIS — R509 Fever, unspecified: Secondary | ICD-10-CM | POA: Diagnosis present

## 2023-10-06 DIAGNOSIS — Z8249 Family history of ischemic heart disease and other diseases of the circulatory system: Secondary | ICD-10-CM | POA: Diagnosis not present

## 2023-10-06 DIAGNOSIS — I7121 Aneurysm of the ascending aorta, without rupture: Secondary | ICD-10-CM | POA: Diagnosis present

## 2023-10-06 DIAGNOSIS — Z86711 Personal history of pulmonary embolism: Secondary | ICD-10-CM | POA: Diagnosis not present

## 2023-10-06 DIAGNOSIS — J189 Pneumonia, unspecified organism: Secondary | ICD-10-CM | POA: Diagnosis present

## 2023-10-06 DIAGNOSIS — I129 Hypertensive chronic kidney disease with stage 1 through stage 4 chronic kidney disease, or unspecified chronic kidney disease: Secondary | ICD-10-CM | POA: Diagnosis present

## 2023-10-06 DIAGNOSIS — Z1152 Encounter for screening for COVID-19: Secondary | ICD-10-CM | POA: Diagnosis not present

## 2023-10-06 DIAGNOSIS — J869 Pyothorax without fistula: Secondary | ICD-10-CM | POA: Diagnosis present

## 2023-10-06 DIAGNOSIS — K2970 Gastritis, unspecified, without bleeding: Secondary | ICD-10-CM | POA: Diagnosis present

## 2023-10-06 DIAGNOSIS — N179 Acute kidney failure, unspecified: Secondary | ICD-10-CM | POA: Diagnosis present

## 2023-10-06 DIAGNOSIS — E872 Acidosis, unspecified: Secondary | ICD-10-CM | POA: Diagnosis present

## 2023-10-06 DIAGNOSIS — Z86718 Personal history of other venous thrombosis and embolism: Secondary | ICD-10-CM | POA: Diagnosis not present

## 2023-10-06 DIAGNOSIS — Z9221 Personal history of antineoplastic chemotherapy: Secondary | ICD-10-CM | POA: Diagnosis not present

## 2023-10-06 LAB — CBG MONITORING, ED
Glucose-Capillary: 100 mg/dL — ABNORMAL HIGH (ref 70–99)
Glucose-Capillary: 87 mg/dL (ref 70–99)
Glucose-Capillary: 75 mg/dL (ref 70–99)
Glucose-Capillary: 76 mg/dL (ref 70–99)
Glucose-Capillary: 59 mg/dL — ABNORMAL LOW (ref 70–99)
Glucose-Capillary: 56 mg/dL — ABNORMAL LOW (ref 70–99)
Glucose-Capillary: 88 mg/dL (ref 70–99)
Glucose-Capillary: 76 mg/dL (ref 70–99)

## 2023-10-06 LAB — HEMOGLOBIN AND HEMATOCRIT, BLOOD
HCT: 21.4 % — ABNORMAL LOW (ref 39.0–52.0)
Hemoglobin: 6.9 g/dL — CL (ref 13.0–17.0)

## 2023-10-06 LAB — CBC
HCT: 19.7 % — ABNORMAL LOW (ref 39.0–52.0)
Hemoglobin: 6.5 g/dL — CL (ref 13.0–17.0)
MCH: 30.7 pg (ref 26.0–34.0)
MCHC: 33 g/dL (ref 30.0–36.0)
MCV: 92.9 fL (ref 80.0–100.0)
Platelets: 177 10*3/uL (ref 150–400)
RBC: 2.12 MIL/uL — ABNORMAL LOW (ref 4.22–5.81)
RDW: 15.4 % (ref 11.5–15.5)
WBC: 10.9 10*3/uL — ABNORMAL HIGH (ref 4.0–10.5)
nRBC: 0 % (ref 0.0–0.2)

## 2023-10-06 LAB — BASIC METABOLIC PANEL WITH GFR
Anion gap: 7 (ref 5–15)
BUN: 19 mg/dL (ref 8–23)
CO2: 20 mmol/L — ABNORMAL LOW (ref 22–32)
Calcium: 8.6 mg/dL — ABNORMAL LOW (ref 8.9–10.3)
Chloride: 109 mmol/L (ref 98–111)
Creatinine, Ser: 2.03 mg/dL — ABNORMAL HIGH (ref 0.61–1.24)
GFR, Estimated: 35 mL/min — ABNORMAL LOW (ref >60)
Glucose, Bld: 88 mg/dL (ref 70–99)
Potassium: 4.8 mmol/L (ref 3.5–5.1)
Sodium: 136 mmol/L (ref 135–145)

## 2023-10-06 LAB — GLUCOSE, CAPILLARY
Glucose-Capillary: 99 mg/dL (ref 70–99)
Glucose-Capillary: 109 mg/dL — ABNORMAL HIGH (ref 70–99)

## 2023-10-06 LAB — HIV ANTIBODY (ROUTINE TESTING W REFLEX): HIV Screen 4th Generation wRfx: NONREACTIVE

## 2023-10-06 LAB — MRSA NEXT GEN BY PCR, NASAL: MRSA by PCR Next Gen: NOT DETECTED

## 2023-10-06 MED ORDER — INSULIN ASPART 100 UNIT/ML IJ SOLN
0.0000 [IU] | INTRAMUSCULAR | Status: DC
  Administered 2023-10-07: 2 [IU] via SUBCUTANEOUS
  Administered 2023-10-08 (×2): 1 [IU] via SUBCUTANEOUS

## 2023-10-06 MED ORDER — ATORVASTATIN CALCIUM 40 MG PO TABS
40.0000 mg | ORAL_TABLET | Freq: Every morning | ORAL | Status: DC
  Administered 2023-10-06 – 2023-10-08 (×3): 40 mg via ORAL
  Filled 2023-10-06 (×3): qty 1

## 2023-10-06 MED ORDER — ACETAMINOPHEN 650 MG RE SUPP: 650.0000 mg | Freq: Four times a day (QID) | RECTAL | Status: DC | PRN

## 2023-10-06 MED ORDER — DEXTROSE 50 % IV SOLN
12.5000 g | INTRAVENOUS | Status: AC
  Administered 2023-10-06: 12.5 g via INTRAVENOUS

## 2023-10-06 MED ORDER — LACTATED RINGERS IV SOLN: INTRAVENOUS | Status: AC

## 2023-10-06 MED ORDER — DEXTROSE 50 % IV SOLN
INTRAVENOUS | Status: AC
  Filled 2023-10-06: qty 50

## 2023-10-06 MED ORDER — METRONIDAZOLE 500 MG/100ML IV SOLN
500.0000 mg | Freq: Two times a day (BID) | INTRAVENOUS | Status: DC
  Administered 2023-10-06 – 2023-10-08 (×5): 500 mg via INTRAVENOUS
  Filled 2023-10-06 (×5): qty 100

## 2023-10-06 MED ORDER — ORAL CARE MOUTH RINSE: 15.0000 mL | OROMUCOSAL | Status: DC | PRN

## 2023-10-06 MED ORDER — SODIUM CHLORIDE 0.9 % IV SOLN
2.0000 g | Freq: Two times a day (BID) | INTRAVENOUS | Status: DC
  Administered 2023-10-06 – 2023-10-07 (×3): 2 g via INTRAVENOUS
  Filled 2023-10-06 (×3): qty 12.5

## 2023-10-06 MED ORDER — ONDANSETRON HCL 4 MG/2ML IJ SOLN: 4.0000 mg | Freq: Four times a day (QID) | INTRAMUSCULAR | Status: DC | PRN

## 2023-10-06 MED ORDER — ACETAMINOPHEN 325 MG PO TABS: 650.0000 mg | ORAL_TABLET | Freq: Four times a day (QID) | ORAL | Status: DC | PRN

## 2023-10-06 MED ORDER — DEXTROSE 5 % IV BOLUS
250.0000 mL | Freq: Once | INTRAVENOUS | Status: AC
  Administered 2023-10-06: 250 mL via INTRAVENOUS

## 2023-10-06 NOTE — ED Notes (Signed)
 Patient transported to IR

## 2023-10-06 NOTE — H&P (Signed)
 History and Physical    Jahmier Willadsen RUE:[id number] DOB: 17-May-1956 DOA: 10/05/2023  PCP: [name], MD  Patient coming from: Home  Chief Complaint: Fever  HPI: Brandin Stetzer is a 68 y.o. male with medical history significant of stage IIa non-small cell lung cancer (adenocarcinoma) status post right upper lobectomy in September 2024 and underwent adjuvant systemic chemotherapy, diagnosed with PE and extensive left lower extremity DVT in January 2025 status post mechanical thrombectomy and iliac vein stenting currently on Eliquis, iron deficiency anemia, insulin-dependent type 2 diabetes, hypertension, hyperlipidemia, CKD stage IIIa presented to the ED for evaluation of fever, chills, nausea, and vomiting.  Vital signs on arrival: Temperature 101.5 F, pulse 118, respiratory rate 36, blood pressure 118/63, and SpO2 98% on room air.  Labs showing no leukocytosis, hemoglobin 7.8 (baseline 8-9), MCV 91.9, bicarb 20, anion gap 9, glucose 129, creatinine 1.9 (baseline 1.3-1.5), PT 18.9, INR 1.6, lactic acid 2.4> 1.2, UA not suggestive of infection, COVID/influenza/RSV PCR negative, blood cultures collected.  CT chest showing findings concerning for left lower lobe pneumonia, left lung empyema with gas forming organism versus possible small pneumothorax.  No evidence of pneumomediastinum.  CT abdomen pelvis showing findings concerning for possible appendicitis versus appendiceal mucocele. Patient was given Tylenol, cefepime, metronidazole, and 2 L normal saline boluses.  ED physician discussed the case with Dr. Charon Copper with PCCM who requested hospitalist admission and consulting IR in the morning for chest tube placement.  Recommended continuing antibiotic coverage with cefepime and metronidazole.  General surgery was also consulted (Dr. Ramiro Burly).  [facility] called to admit.  History provided by the patient and his wife at bedside.  For the past few days he was having generalized weakness/fatigue.  Today  around 2 PM he suddenly started feeling worse and had nausea and vomiting.  He had multiple episodes of vomiting at home and wife is concerned that he might have aspirated as he started coughing.  He had temperature of 101.7 F and started having chills.  Patient denies shortness of breath or chest pain.  He had left-sided abdominal pain a week ago which has since resolved.  Patient denies any abdominal pain at present.  No longer vomiting at this time.  He finished chemotherapy for lung cancer back in January.  Review of Systems:  Review of Systems  All other systems reviewed and are negative.   Past Medical History:  Diagnosis Date   Diabetes mellitus    High cholesterol    Hypertension     Past Surgical History:  Procedure Laterality Date   BIOPSY OF SKIN SUBCUTANEOUS TISSUE AND/OR MUCOUS MEMBRANE  09/29/2023   Procedure: BIOPSY, SKIN, SUBCUTANEOUS TISSUE, OR MUCOUS MEMBRANE;  Surgeon: Felecia Hopper, MD;  Location: [facility];  Service: Gastroenterology;;   BRONCHIAL BIOPSY  01/05/2023   Procedure: BRONCHIAL BIOPSIES;  Surgeon: Prudy Brownie, DO;  Location: [facility];  Service: Pulmonary;;   BRONCHIAL BRUSHINGS  01/05/2023   Procedure: BRONCHIAL BRUSHINGS;  Surgeon: Prudy Brownie, DO;  Location: [facility];  Service: Pulmonary;;   BRONCHIAL NEEDLE ASPIRATION BIOPSY  01/05/2023   Procedure: BRONCHIAL NEEDLE ASPIRATION BIOPSIES;  Surgeon: Prudy Brownie, DO;  Location: [facility];  Service: Pulmonary;;   COLONOSCOPY N/A 09/29/2023   Procedure: COLONOSCOPY;  Surgeon: Felecia Hopper, MD;  Location: [facility];  Service: Gastroenterology;  Laterality: N/A;   ESOPHAGOGASTRODUODENOSCOPY N/A 09/29/2023   Procedure: EGD (ESOPHAGOGASTRODUODENOSCOPY);  Surgeon: Felecia Hopper, MD;  Location: [facility];  Service: Gastroenterology;  Laterality: N/A;   INTERCOSTAL NERVE BLOCK  Left 03/18/2023   Procedure: INTERCOSTAL NERVE BLOCK;  Surgeon: Corliss Skains, MD;  Location:  [facility] OR;  Service: Thoracic;  Laterality: Left;   KNEE SURGERY Left 2004   torn cartilage   LYMPH NODE BIOPSY Left 03/18/2023   Procedure: LYMPH NODE BIOPSY;  Surgeon: Corliss Skains, MD;  Location: [facility] OR;  Service: Thoracic;  Laterality: Left;   PATELLAR TENDON REPAIR  06/11/2011   Procedure: PATELLA TENDON REPAIR;  Surgeon: Cammy Copa;  Location: [facility];  Service: Orthopedics;  Laterality: Right;   PERIPHERAL VASCULAR INTERVENTION  06/26/2023   Procedure: PERIPHERAL VASCULAR INTERVENTION;  Surgeon: Leonie Douglas, MD;  Location: [facility];  Service: Cardiovascular;;   PERIPHERAL VASCULAR THROMBECTOMY Left 06/26/2023   Procedure: PERIPHERAL VASCULAR THROMBECTOMY;  Surgeon: Leonie Douglas, MD;  Location: [facility];  Service: Cardiovascular;  Laterality: Left;   PERIPHERAL VASCULAR ULTRASOUND/IVUS  06/26/2023   Procedure: Peripheral Vascular Ultrasound/IVUS;  Surgeon: Leonie Douglas, MD;  Location: [facility] INVASIVE CV LAB;  Service: Cardiovascular;;   POLYPECTOMY  09/29/2023   Procedure: POLYPECTOMY, STOMACH AND COLON;  Surgeon: Kathi Der, MD;  Location: [facility];  Service: Gastroenterology;;     reports that he has never smoked. He has never used smokeless tobacco. He reports that he does not drink alcohol and does not use drugs.  No Known Allergies  Family History  Problem Relation Age of Onset   Hypertension Mother    Multiple myeloma Mother    Hypertension Father    Diabetes Father     Prior to Admission medications   Medication Sig Start Date End Date Taking? Authorizing Provider  amLODipine (NORVASC) 10 MG tablet TAKE 1 TABLET BY MOUTH EVERY DAY Patient taking differently: Take 10 mg by mouth in the morning. 03/18/23  Yes [name], MD  apixaban (ELIQUIS) 5 MG TABS tablet Take 1 tablet (5 mg total) by mouth 2 (two) times daily. 07/06/23  Yes Heilingoetter, Cassandra L, PA-C  atorvastatin (LIPITOR) 40 MG tablet Take 1 tablet (40 mg total) by  mouth daily. Patient taking differently: Take 40 mg by mouth in the morning. 07/30/23  Yes [name], MD  calcium carbonate (TUMS EX) 750 MG chewable tablet Chew 3 tablets by mouth as needed for heartburn.   Yes [provider]  cyanocobalamin (VITAMIN B12) 1000 MCG/ML injection INJECT 1 ML (1,000 MCG TOTAL) INTO THE MUSCLE EVERY 30 DAYS. Patient taking differently: Inject 1,000 mcg into the muscle every 30 (thirty) days. 12/10/22  Yes [name], MD  Dulaglutide (TRULICITY) 1.5 MG/0.5ML SOAJ Inject 1.5 mg into the skin every Sunday.   Yes [provider]  enalapril (VASOTEC) 20 MG tablet TAKE 1 TABLET BY MOUTH TWICE A DAY 09/23/23  Yes [name], MD  folic acid (FOLVITE) 1 MG tablet TAKE 1 TABLET BY MOUTH EVERY DAY 08/26/23  Yes Heilingoetter, Cassandra L, PA-C  insulin glargine (LANTUS) 100 UNIT/ML injection Inject 0.16 mLs (16 Units total) into the skin at bedtime. Patient taking differently: Inject 19 Units into the skin at bedtime. 06/28/23  Yes [name], MD  B-D ULTRAFINE III SHORT PEN 31G X 8 MM MISC Inject into the skin as directed. 06/30/23   [provider]  glucose blood (ONETOUCH VERIO) test strip Use as instructed to check blood sugars twice daily E11.69 06/23/23   [name], MD  glucose blood test strip Use as instructed to test blood sugar 3 times a day. Dx code: e11.65 11/26/21  [name], MD  sildenafil (VIAGRA) 100 MG tablet TAKE 1 TABLET BY MOUTH EVERY DAY AS NEEDED (INSURANCE COVERS 10 TAB PER 77DAYS) Patient taking differently: Take 100 mg by mouth as needed for erectile dysfunction. 02/13/23   [name], MD  SYRINGE-NEEDLE, DISP, 3 ML (B-D INTEGRA SYRINGE) 25G X 5/8" 3 ML MISC USE AS DIRECTED 06/23/23   [name], MD    Physical Exam: Vitals:   10/05/23 2139 10/05/23 2141 10/05/23 2145 10/05/23 2200  BP:  (!) 113/56 (!) 111/57 (!) 116/55  Pulse:  (!) 103 (!) 103 (!) 105  Resp:  (!) 26 (!) 27 (!) 0  Temp:  99.5 F  (37.5 C)    TempSrc:  Oral    SpO2:  98% 98% 98%  Weight: 98.9 kg     Height: 6\' 3"  (1.905 m)       Physical Exam Vitals reviewed.  Constitutional:      General: He is not in acute distress. HENT:     Head: Normocephalic and atraumatic.  Eyes:     Extraocular Movements: Extraocular movements intact.  Cardiovascular:     Rate and Rhythm: Normal rate and regular rhythm.     Pulses: Normal pulses.  Pulmonary:     Effort: Pulmonary effort is normal. No respiratory distress.     Breath sounds: No wheezing, rhonchi or rales.  Abdominal:     General: Bowel sounds are normal. There is no distension.     Palpations: Abdomen is soft.     Tenderness: There is no abdominal tenderness. There is no guarding or rebound.  Musculoskeletal:     Cervical back: Normal range of motion.     Right lower leg: No edema.     Left lower leg: No edema.  Skin:    General: Skin is warm and dry.  Neurological:     General: No focal deficit present.     Mental Status: He is alert and oriented to person, place, and time.     Labs on Admission: I have personally reviewed following labs and imaging studies  CBC: Recent Labs  Lab 10/05/23 0939 10/05/23 1940  WBC 6.4 5.1  NEUTROABS 4.0 4.6  HGB 8.1* 7.8*  HCT 24.6* 23.7*  MCV 91.1 91.9  PLT 214 209   Basic Metabolic Panel: Recent Labs  Lab 10/05/23 0939 10/05/23 1940  NA 135 136  K 4.6 5.0  CL 106 107  CO2 25 20*  GLUCOSE 116* 129*  BUN 16 20  CREATININE 1.46* 1.94*  CALCIUM 9.7 9.3   GFR: Estimated Creatinine Clearance: 43 mL/min (A) (by C-G formula based on SCr of 1.94 mg/dL (H)). Liver Function Tests: Recent Labs  Lab 10/05/23 0939 10/05/23 1940  AST 16 24  ALT 14 16  ALKPHOS 150* 109  BILITOT 0.4 0.7  PROT 9.1* 8.7*  ALBUMIN 3.8 3.2*   No results for input(s): "LIPASE", "AMYLASE" in the last 168 hours. No results for input(s): "AMMONIA" in the last 168 hours. Coagulation Profile: Recent Labs  Lab 10/05/23 1940   INR 1.6*   Cardiac Enzymes: No results for input(s): "CKTOTAL", "CKMB", "CKMBINDEX", "TROPONINI" in the last 168 hours. BNP (last 3 results) No results for input(s): "PROBNP" in the last 8760 hours. HbA1C: No results for input(s): "HGBA1C" in the last 72 hours. CBG: Recent Labs  Lab 09/29/23 1000  GLUCAP 78   Lipid Profile: No results for input(s): "CHOL", "HDL", "LDLCALC", "TRIG", "CHOLHDL", "LDLDIRECT" in the last 72 hours. Thyroid Function Tests: No  results for input(s): "TSH", "T4TOTAL", "FREET4", "T3FREE", "THYROIDAB" in the last 72 hours. Anemia Panel: No results for input(s): "VITAMINB12", "FOLATE", "FERRITIN", "TIBC", "IRON", "RETICCTPCT" in the last 72 hours. Urine analysis:    Component Value Date/Time   COLORURINE YELLOW 10/05/2023 2155   APPEARANCEUR HAZY (A) 10/05/2023 2155   LABSPEC 1.028 10/05/2023 2155   PHURINE 5.0 10/05/2023 2155   GLUCOSEU NEGATIVE 10/05/2023 2155   HGBUR NEGATIVE 10/05/2023 2155   BILIRUBINUR NEGATIVE 10/05/2023 2155   BILIRUBINUR Negative 08/18/2022 1616   KETONESUR NEGATIVE 10/05/2023 2155   PROTEINUR NEGATIVE 10/05/2023 2155   UROBILINOGEN 0.2 08/18/2022 1616   UROBILINOGEN 0.2 06/27/2011 1205   NITRITE NEGATIVE 10/05/2023 2155   LEUKOCYTESUR TRACE (A) 10/05/2023 2155    Radiological Exams on Admission: CT CHEST ABDOMEN PELVIS W CONTRAST Result Date: 10/05/2023 CLINICAL DATA:  Left lung cancer.  Fever with nausea and vomiting. * Tracking Code: BO * EXAM: CT CHEST, ABDOMEN, AND PELVIS WITH CONTRAST TECHNIQUE: Multidetector CT imaging of the chest, abdomen and pelvis was performed following the standard protocol during bolus administration of intravenous contrast. RADIATION DOSE REDUCTION: This exam was performed according to the departmental dose-optimization program which includes automated exposure control, adjustment of the mA and/or kV according to patient size and/or use of iterative reconstruction technique. CONTRAST:  75mL  OMNIPAQUE IOHEXOL 350 MG/ML SOLN COMPARISON:  08/20/2023 FINDINGS: CT CHEST FINDINGS Cardiovascular: Ascending thoracic aortic aneurysm stable at 4.0 cm diameter. Mediastinum/Nodes: Small thyroid nodules measuring up to 1.0 cm in diameter. Not clinically significant; no follow-up imaging recommended (ref: J Am Coll Radiol. 2015 Feb;12(2): 143-50). No pathologic thoracic adenopathy.  Mild gynecomastia. Lungs/Pleura: Left upper lobectomy. Increased patchy airspace opacity scattered in the left lower lobe favoring pneumonia. New gas in the left pleural fluid collection both anteriorly and in the sub pulmonic fluid; if the patient has had recent thoracentesis then this may be incidental pneumothorax component, but otherwise the possibility of early empyema with gas-forming organism might be considered. No pneumomediastinum. Musculoskeletal: Thoracic spondylosis. CT ABDOMEN PELVIS FINDINGS Hepatobiliary: Unremarkable Pancreas: Unremarkable Spleen: Unremarkable Adrenals/Urinary Tract: Stable 1.3 by 1.7 cm left adrenal mass shown to be an adenoma on 12/09/2022. No follow up is indicated. Mild scarring in the left mid kidney anteriorly. Stomach/Bowel: Thickened appendix mildly worsened from previous, up to 1.1 cm in diameter. Cannot exclude appendicitis or appendiceal mucocele. Trace stranding adjacent to the appendiceal tip for example on image 78 series 3. Vascular/Lymphatic: Left common iliac vein stent noted. No pathologic adenopathy in the abdomen. Reproductive: Unremarkable Other: No supplemental non-categorized findings. Musculoskeletal: Bridging spurring at the sacroiliac joints anteriorly. Lower lumbar spondylosis and degenerative disc disease. IMPRESSION: 1. Increased patchy airspace opacity scattered in the left lower lobe favoring pneumonia. 2. New gas in the left pleural fluid collection both anteriorly and in the subpulmonic fluid; if the patient has had recent thoracentesis then this may be incidental  pneumothorax component, but otherwise the possibility of early empyema with gas-forming organism might be considered. No pneumomediastinum observed. 3. Thickened appendix mildly worsened from previous, up to 1.1 cm in diameter. Cannot exclude appendicitis or appendiceal mucocele. Trace stranding adjacent to the appendiceal tip. 4. Stable ascending thoracic aortic aneurysm at 4.0 cm diameter. This can likely be followed in the context of the patient's oncology imaging. Otherwise, recommend annual imaging followup by CTA or MRA. This recommendation follows 2010 ACCF/AHA/AATS/ACR/ASA/SCA/SCAI/SIR/STS/SVM Guidelines for the Diagnosis and Management of Patients with Thoracic Aortic Disease. Circulation. 2010; 121: A213-Y865. Aortic aneurysm NOS (ICD10-I71.9) 5. Stable left adrenal  adenoma. 6. Mild gynecomastia. 7. Lower lumbar spondylosis and degenerative disc disease. 8. Bridging spurring at the sacroiliac joints anteriorly. These results were called by telephone at the time of interpretation on 10/05/2023 at 10:01 pm to provider [facility] , who verbally acknowledged these results. Electronically Signed   By: Gaylyn Rong M.D.   On: 10/05/2023 22:07   DG Chest 2 View Result Date: 10/05/2023 CLINICAL DATA:  Suspected sepsis.  Fever, nausea, vomiting EXAM: CHEST - 2 VIEW COMPARISON:  05/05/2023.  CT 08/20/2023 FINDINGS: Heart and mediastinal contours within normal limits. Right lung clear. Volume loss on the left with small loculated left pleural effusion as seen on prior CT. No definite confluent airspace opacity. Postoperative changes on the left. No acute bony abnormality. IMPRESSION: Postoperative changes on the left with volume loss and small loculated left pleural effusion. Electronically Signed   By: Charlett Nose M.D.   On: 10/05/2023 21:27    EKG: Independently reviewed.  Sinus tachycardia with first-degree AV block, artifact.  Assessment and Plan  Severe sepsis secondary to left lower lobe  pneumonia/empyema ?Small pneumothorax Met criteria for severe sepsis at the time of presentation with fever, tachycardia, tachypnea, and lactic acidosis. CT chest showing findings concerning for left lower lobe pneumonia, left lung empyema with gas forming organism versus possible small pneumothorax.  No evidence of pneumomediastinum. COVID/influenza/RSV PCR negative.  PCCM recommended consulting IR in the morning for chest tube placement and continuing antibiotic coverage with cefepime and metronidazole.  Patient is not hypoxic, he was placed on 3 L [place] in the ED for comfort.  Continue antibiotics.  Keep n.p.o. and consult IR in the morning for chest tube placement.  Patient was given 30 cc/Kg IV fluids in the ED.  Lactic acidosis has resolved and not hypotensive.  Tachycardia has improved.  Continue maintenance IV fluids.  Trend WBC count and follow-up blood cultures.  ?Appendicitis versus appendiceal mucocele Patient presenting with complaints of acute onset nausea and vomiting since this afternoon.  CT abdomen pelvis showing findings concerning for possible appendicitis versus appendiceal mucocele.  However, patient is not having any abdominal pain or tenderness.  No longer actively vomiting at this time.  General surgery consulted, keep n.p.o., and continue antibiotics.  Antiemetic as needed.  AKI on CKD stage IIIa Likely prerenal from dehydration.  Creatinine 1.9, baseline 1.3-1.5.  Continue IV fluid hydration and monitor renal function.  Avoid nephrotoxic agents/hold enalapril.  Mild normal anion gap metabolic acidosis Continue IV fluid hydration and monitor labs.  Stage IIa non-small cell lung cancer (adenocarcinoma) Status post right upper lobectomy in September 2024 and finished chemotherapy in January.  Outpatient oncology follow-up.  History of PE and extensive left lower extremity DVT in January 2025 status post mechanical thrombectomy and iliac vein stenting Hold Eliquis at this time  until chest tube is placed.  Chronic normocytic anemia Hemoglobin currently 7.8, baseline 8-9.  No signs of bleeding.  Continue to monitor CBC.  Insulin-dependent type 2 diabetes Patient with history of hypoglycemic episodes during previous hospitalization.  Glucose currently in the 120s.  A1c 8.5 on 07/09/2023.  Since he is n.p.o., will hold his long-acting insulin at this time and continue CBG checks every 4 hours/sensitive sliding scale insulin as needed.  Hypertension Holding antihypertensives at this time in the setting of severe sepsis to avoid hypotension.  Hyperlipidemia Continue Lipitor.  Incidental AAA CT showing stable ascending thoracic aortic aneurysm at 4.0 cm diameter.  Radiologist recommending annual imaging follow-up by CTA or MRA.  Code Status: Full Code (discussed with the patient) Family Communication: Wife at bedside. Level of care: Progressive Care Unit Admission status: It is my clinical opinion that admission to INPATIENT is reasonable and necessary because of the expectation that this patient will require hospital care that crosses at least 2 midnights to treat this condition based on the medical complexity of the problems presented.  Given the aforementioned information, the predictability of an adverse outcome is felt to be significant.  Juliette Oh MD [facility]  If 7PM-7AM, please contact night-coverage [email or web address]  10/06/2023, 12:24 AM

## 2023-10-06 NOTE — Progress Notes (Signed)
 Patient presented to radiology for thoracentesis today.   Bilateral pleural space visualized with ultrasound.  No pleural effusion in right, very small pleural effusion in left with small lung flap, no window for safe thoracentesis. After thorough discussion with the patient, decision was made to NPT proceed with thoracentesis. Ultrasound images saved for documentation purpose.   Ordering MD notified that thora was not performed.    Annalaya Wile H Aarsh Fristoe PA-C 10/06/2023 1:06 PM

## 2023-10-06 NOTE — Consult Note (Signed)
 Reason for Consult:  dilated appendix Referring Provider: Elayne Snare, DO  HPI  William Mcguire is an 68 y.o. male with history of lung cancer s/p RULobectomy in September 2024 and adjuvant chemo, recurrent UTIs, T2DM, CKD, DVT and PE s/p mechanical thrombectomy and iliac vein stent now on eliquis who presented to ED due to concern for infection.  Had nausea and emesis earlier today and found to be febrile, no changes in bowel habits. Reports chills. No SOB. Last chemo was in January.  CT showed left lower lobe pneumonia, left lung empyema with gas forming organism v possible ptx. Concern for dilated appendix on CT scan either appendicitis v mucocele. Patient states he has never had right sided abdominal pain. He had some left sided abdominal pain a week or 2 ago that resolved.  He had colonoscopy last Tuesday, several polyps removed, all benign. Also had EGD that showed gastritis and some areas of intestinal metaplasia. Left sided abdominal pain seemed to happen day after colonoscopy.  10 point review of systems is negative except as listed above in HPI.  Objective  Past Medical History: Past Medical History:  Diagnosis Date   Diabetes mellitus    High cholesterol    Hypertension     Past Surgical History: Past Surgical History:  Procedure Laterality Date   BIOPSY OF SKIN SUBCUTANEOUS TISSUE AND/OR MUCOUS MEMBRANE  09/29/2023   Procedure: BIOPSY, SKIN, SUBCUTANEOUS TISSUE, OR MUCOUS MEMBRANE;  Surgeon: Kathi Der, MD;  Location: [facility];  Service: Gastroenterology;;   BRONCHIAL BIOPSY  01/05/2023   Procedure: BRONCHIAL BIOPSIES;  Surgeon: Josephine Igo, DO;  Location: [facility];  Service: Pulmonary;;   BRONCHIAL BRUSHINGS  01/05/2023   Procedure: BRONCHIAL BRUSHINGS;  Surgeon: Josephine Igo, DO;  Location: [facility];  Service: Pulmonary;;   BRONCHIAL NEEDLE ASPIRATION BIOPSY  01/05/2023   Procedure: BRONCHIAL NEEDLE ASPIRATION BIOPSIES;  Surgeon: Josephine Igo,  DO;  Location: [facility];  Service: Pulmonary;;   COLONOSCOPY N/A 09/29/2023   Procedure: COLONOSCOPY;  Surgeon: Kathi Der, MD;  Location: [facility];  Service: Gastroenterology;  Laterality: N/A;   ESOPHAGOGASTRODUODENOSCOPY N/A 09/29/2023   Procedure: EGD (ESOPHAGOGASTRODUODENOSCOPY);  Surgeon: Kathi Der, MD;  Location: [facility];  Service: Gastroenterology;  Laterality: N/A;   INTERCOSTAL NERVE BLOCK Left 03/18/2023   Procedure: INTERCOSTAL NERVE BLOCK;  Surgeon: Corliss Skains, MD;  Location: [facility];  Service: Thoracic;  Laterality: Left;   KNEE SURGERY Left 2004   torn cartilage   LYMPH NODE BIOPSY Left 03/18/2023   Procedure: LYMPH NODE BIOPSY;  Surgeon: Corliss Skains, MD;  Location: [facility];  Service: Thoracic;  Laterality: Left;   PATELLAR TENDON REPAIR  06/11/2011   Procedure: PATELLA TENDON REPAIR;  Surgeon: Cammy Copa;  Location: [facility];  Service: Orthopedics;  Laterality: Right;   PERIPHERAL VASCULAR INTERVENTION  06/26/2023   Procedure: PERIPHERAL VASCULAR INTERVENTION;  Surgeon: Leonie Douglas, MD;  [facility];  Service: Cardiovascular;;   PERIPHERAL VASCULAR THROMBECTOMY Left 06/26/2023   Procedure: PERIPHERAL VASCULAR THROMBECTOMY;  Surgeon: Leonie Douglas, MD;  [facility];  Service: Cardiovascular;  Laterality: Left;   PERIPHERAL VASCULAR ULTRASOUND/IVUS  06/26/2023   Procedure: Peripheral Vascular Ultrasound/IVUS;  Surgeon: Leonie Douglas, MD;  Location: [facility] CV [facility];  Service: Cardiovascular;;   POLYPECTOMY  09/29/2023   Procedure: POLYPECTOMY, STOMACH AND COLON;  Surgeon: Kathi Der, MD;  Location: [facility];  Service: Gastroenterology;;    Family History:  Family History  Problem Relation Age of Onset   Hypertension Mother    Multiple myeloma Mother    Hypertension Father    Diabetes Father     Social History:  reports that he has never smoked. He has never used smokeless  tobacco. He reports that he does not drink alcohol and does not use drugs.  Allergies: No Known Allergies  Medications: I have reviewed the patient's current medications.  Labs: I have personally reviewed all labs for the past 24h  Imaging: I have personally reviewed and interpreted all imaging for the past 24h and agree with the radiologist's impression.  CT CHEST ABDOMEN PELVIS W CONTRAST Result Date: 10/05/2023 CLINICAL DATA:  Left lung cancer.  Fever with nausea and vomiting. * Tracking Code: BO * EXAM: CT CHEST, ABDOMEN, AND PELVIS WITH CONTRAST TECHNIQUE: Multidetector CT imaging of the chest, abdomen and pelvis was performed following the standard protocol during bolus administration of intravenous contrast. RADIATION DOSE REDUCTION: This exam was performed according to the departmental dose-optimization program which includes automated exposure control, adjustment of the mA and/or kV according to patient size and/or use of iterative reconstruction technique. CONTRAST:  75mL OMNIPAQUE IOHEXOL 350 MG/ML SOLN COMPARISON:  08/20/2023 FINDINGS: CT CHEST FINDINGS Cardiovascular: Ascending thoracic aortic aneurysm stable at 4.0 cm diameter. Mediastinum/Nodes: Small thyroid nodules measuring up to 1.0 cm in diameter. Not clinically significant; no follow-up imaging recommended (ref: J Am Coll Radiol. 2015 Feb;12(2): 143-50). No pathologic thoracic adenopathy.  Mild gynecomastia. Lungs/Pleura: Left upper lobectomy. Increased patchy airspace opacity scattered in the left lower lobe favoring pneumonia. New gas in the left pleural fluid collection both anteriorly and in the sub pulmonic fluid; if the patient has had recent thoracentesis then this may be incidental pneumothorax component, but otherwise the possibility of early empyema with gas-forming organism might be considered. No pneumomediastinum. Musculoskeletal: Thoracic spondylosis. CT ABDOMEN PELVIS FINDINGS Hepatobiliary: Unremarkable Pancreas:  Unremarkable Spleen: Unremarkable Adrenals/Urinary Tract: Stable 1.3 by 1.7 cm left adrenal mass shown to be an adenoma on 12/09/2022. No follow up is indicated. Mild scarring in the left mid kidney anteriorly. Stomach/Bowel: Thickened appendix mildly worsened from previous, up to 1.1 cm in diameter. Cannot exclude appendicitis or appendiceal mucocele. Trace stranding adjacent to the appendiceal tip for example on image 78 series 3. Vascular/Lymphatic: Left common iliac vein stent noted. No pathologic adenopathy in the abdomen. Reproductive: Unremarkable Other: No supplemental non-categorized findings. Musculoskeletal: Bridging spurring at the sacroiliac joints anteriorly. Lower lumbar spondylosis and degenerative disc disease. IMPRESSION: 1. Increased patchy airspace opacity scattered in the left lower lobe favoring pneumonia. 2. New gas in the left pleural fluid collection both anteriorly and in the subpulmonic fluid; if the patient has had recent thoracentesis then this may be incidental pneumothorax component, but otherwise the possibility of early empyema with gas-forming organism might be considered. No pneumomediastinum observed. 3. Thickened appendix mildly worsened from previous, up to 1.1 cm in diameter. Cannot exclude appendicitis or appendiceal mucocele. Trace stranding adjacent to the appendiceal tip. 4. Stable ascending thoracic aortic aneurysm at 4.0 cm diameter. This can likely be followed in the context of the patient's oncology imaging. Otherwise, recommend annual imaging followup by CTA or MRA. This recommendation follows 2010 ACCF/AHA/AATS/ACR/ASA/SCA/SCAI/SIR/STS/SVM Guidelines for the Diagnosis and Management of Patients with Thoracic Aortic Disease. Circulation. 2010; 121: T016-W109. Aortic aneurysm NOS (ICD10-I71.9) 5. Stable left adrenal adenoma. 6. Mild gynecomastia. 7. Lower lumbar spondylosis and degenerative disc disease. 8. Bridging spurring at the sacroiliac joints anteriorly. These  results were called by telephone  at the time of interpretation on 10/05/2023 at 10:01 pm to provider [facility] , who verbally acknowledged these results. Electronically Signed   By: Freida Jes M.D.   On: 10/05/2023 22:07   DG Chest 2 View Result Date: 10/05/2023 CLINICAL DATA:  Suspected sepsis.  Fever, nausea, vomiting EXAM: CHEST - 2 VIEW COMPARISON:  05/05/2023.  CT 08/20/2023 FINDINGS: Heart and mediastinal contours within normal limits. Right lung clear. Volume loss on the left with small loculated left pleural effusion as seen on prior CT. No definite confluent airspace opacity. Postoperative changes on the left. No acute bony abnormality. IMPRESSION: Postoperative changes on the left with volume loss and small loculated left pleural effusion. Electronically Signed   By: Janeece Mechanic M.D.   On: 10/05/2023 21:27     Physical Exam Blood pressure (!) 101/58, pulse 80, temperature 99.5 F (37.5 C), temperature source Oral, resp. rate 13, height 6\' 3"  (1.905 m), weight 98.9 kg, SpO2 100%. Constitutional: well-developed, well-nourished HEENT: pupils equal, round, reactive to light, moist conjunctiva, hearing intact Oropharynx: mucous membranes moist CV: Regular rate and rhythm, normotensive Chest: equal chest rise bilaterally normal respiratory effort on 3L [place] Abdomen: soft, nondistended, nontender Extremities: moves all extremities Skin: warm, dry, no rashes Psych: normal memory, normal mood/affect  Neuro: No focal neurologic deficits, A&Ox3    Assessment   William Mcguire is an 68 y.o. male who presentswith concerns for infection, found to have pneumonia and empyema, and incidentally found to have dilated appendix.  Plan  - Recommend holding Eliquis, ok for heparin gtt - We will examine patient again in the morning to ensure no development of abdominal pain. If he does not develop pain, then we will discuss whether appendectomy for mucocele should be pursued in inpatient or  outpatient setting. Ideally patient would recover from pneumonia and empyema before pursuing appendectomy and general anesthesia.  I reviewed ED provider notes, hospitalist notes, last 24 h vitals and pain scores, last 48 h intake and output, last 24 h labs and trends, and last 24 h imaging results. I relayed recommendations directly to [facility] attending, Dr. Michell Ahumada.  This care required high  level of medical decision making.   Freddrick Jaffe, MD [facility] Surgery

## 2023-10-06 NOTE — Telephone Encounter (Signed)
 Rescheduled appointment due to the patient being in the hospital. The patient is aware of the changes made.

## 2023-10-06 NOTE — Progress Notes (Signed)
 Dilated appendix noted incidentally on CT - reexamined this patient this afternoon who denies abdominal pain  - suspect dilated appendix may be secondary to recent colonoscopy, especially in absence of abdominal pain - would not recommend appendectomy acutely  - if pt develops abdominal pain or signs/symptoms concerning for appendicitis would recommend repeat CT AP and we would be happy to see again - since no abdominal surgery planned acutely, general surgery will sign off  Annetta Killian, [facility] Surgery 10/06/2023, 1:55 PM Please see Amion for pager number during day hours 7:00am-4:30pm

## 2023-10-06 NOTE — Progress Notes (Signed)
 PROGRESS NOTE    William Mcguire  XBJ:[id number] DOB: 02-06-1956 DOA: 10/05/2023 PCP: [name], MD   Brief Narrative:  DAY ZERO NOTE  Patient presented to the ED for evaluation of fever, chills, nausea, and vomiting -found to meet sepsis criteria with concern for left lower lobe pneumonia with scant pleural effusion versus empyema.  PCCM and IR requested to evaluate, unfortunately no notable volume for thoracentesis or chest tube placement.  Assessment & Plan:   Principal Problem:   Empyema (HCC) Active Problems:   Adenocarcinoma of left lung (HCC)   T2DM (type 2 diabetes mellitus) (HCC)   AKI (acute kidney injury) (HCC)   HLD (hyperlipidemia)   Severe sepsis (HCC)   Pneumothorax   Severe sepsis secondary to left lower lobe pneumonia/empyema, presumed aspiration Lactic acidosis, resolved ?Small pneumothorax -Patient is not hypoxic, was placed on oxygen for comfort -Continue antibiotic coverage for presumed pneumonia, viral panel remains negative -PCCM/IR evaluated but no indication for thoracentesis or chest tube placement at this time -Continue cefepime, Flagyl for presumed aspiration in the setting of recent nausea vomiting  Rule out appendicitis versus appendiceal mucocele -No clinical signs or symptoms of appendicitis, noted incidentally on imaging -General Surgery following, no clear indication for intervention at this time  AKI on CKD stage IIIa [place] acidosis, improving -Likely prerenal from dehydration.  Creatinine 1.9, baseline 1.3-1.5. -Continue IV fluid hydration and monitor renal function.  Avoid nephrotoxic agents/hold enalapril.  Acute on chronic symptomatic anemia, rule out bleeding/blood loss - Patient and wife deny any signs or symptoms of bleeding - Recent colonoscopy unremarkable per wife at bedside - Transfusion pending, repeat labs pending - Likely acute hemodilutional anemia on chronic anemia of chronic disease(CKD/lung cancer)  Stage IIa  non-small cell lung cancer (adenocarcinoma) Status post right upper lobectomy in September 2024 and finished chemotherapy in January.  Outpatient oncology follow-up.   History of PE and extensive left lower extremity DVT in January 2025 status post mechanical thrombectomy and iliac vein stenting Hold Eliquis given above worsening anemia, follow-up for bleeding   Insulin-dependent type 2 diabetes A1c 8.5 on 07/09/2023.   Hypoglycemic protocol enacted given n.p.o. status pending possible procedure Advance diet as tolerated   Hypertension Hold home medications in the setting of hypotension, improving   Hyperlipidemia Continue Lipitor.   Incidental AAA CT showing stable ascending thoracic aortic aneurysm at 4.0 cm diameter.  Radiologist recommending annual imaging follow-up by CTA or MRA.  DVT prophylaxis: None, rule out acute bleed; SCDs, early ambulation in the interim Code Status:   Code Status: Full Code Family Communication: Wife at bedside  Status is: Inpatient  Dispo: The patient is from: Home              Anticipated d/c is to: Home              Anticipated d/c date is: 48 to 72 hours              Patient currently not medically stable for discharge  Consultants:  PCCM, IR  Procedures:  None  Antimicrobials:  Cefepime, Flagyl  Subjective: No acute issues or events overnight, patient reports feeling improved from prior otherwise denies nausea vomiting diarrhea constipation headache fever chills or chest pain  Objective: Vitals:   10/06/23 0500 10/06/23 0504 10/06/23 0600 10/06/23 0609  BP: (!) 105/59 (!) 105/59 (!) 97/57   Pulse: 79 79 84   Resp: (!) 0 18 13   Temp:  99.6 F (37.6 C)  99 F (37.2  C)  TempSrc:  Oral  Oral  SpO2: 100% 100% 100%   Weight:      Height:        Intake/Output Summary (Last 24 hours) at 10/06/2023 0700 Last data filed at 10/05/2023 2304 Gross per 24 hour  Intake 2202.03 ml  Output --  Net 2202.03 ml   Filed Weights   10/05/23  2139  Weight: 98.9 kg    Examination:  General:  Pleasantly resting in bed, No acute distress. HEENT:  Normocephalic atraumatic.  Sclerae nonicteric, noninjected.  Extraocular movements intact bilaterally. Neck:  Without mass or deformity.  Trachea is midline. Lungs: Left-sided rhonchi without overt wheeze, rales. Heart:  Regular rate and rhythm.  Without murmurs, rubs, or gallops. Abdomen:  Soft, nontender, nondistended.  Without guarding or rebound. Extremities: Without cyanosis, clubbing, edema, or obvious deformity. Skin:  Warm and dry, no erythema.   Data Reviewed: I have personally reviewed following labs and imaging studies  CBC: Recent Labs  Lab 10/05/23 0939 10/05/23 1940 10/06/23 0616  WBC 6.4 5.1 10.9*  NEUTROABS 4.0 4.6  --   HGB 8.1* 7.8* 6.5*  HCT 24.6* 23.7* 19.7*  MCV 91.1 91.9 92.9  PLT 214 209 177   Basic Metabolic Panel: Recent Labs  Lab 10/05/23 0939 10/05/23 1940  NA 135 136  K 4.6 5.0  CL 106 107  CO2 25 20*  GLUCOSE 116* 129*  BUN 16 20  CREATININE 1.46* 1.94*  CALCIUM 9.7 9.3   GFR: Estimated Creatinine Clearance: 43 mL/min (A) (by C-G formula based on SCr of 1.94 mg/dL (H)). Liver Function Tests: Recent Labs  Lab 10/05/23 0939 10/05/23 1940  AST 16 24  ALT 14 16  ALKPHOS 150* 109  BILITOT 0.4 0.7  PROT 9.1* 8.7*  ALBUMIN 3.8 3.2*   Coagulation Profile: Recent Labs  Lab 10/05/23 1940  INR 1.6*   CBG: Recent Labs  Lab 09/29/23 1000 10/06/23 0159 10/06/23 0420  GLUCAP 78 100* 87   Sepsis Labs: Recent Labs  Lab 10/05/23 1946 10/05/23 2215  LATICACIDVEN 2.4* 1.2    Recent Results (from the past 240 hours)  Resp panel by RT-PCR (RSV, Flu A&B, Covid)     Status: None   Collection Time: 10/05/23 10:02 PM   Specimen: Nasal Swab  Result Value Ref Range Status   SARS Coronavirus 2 by RT PCR NEGATIVE NEGATIVE Final   Influenza A by PCR NEGATIVE NEGATIVE Final   Influenza B by PCR NEGATIVE NEGATIVE Final    Comment:  (NOTE) The Xpert Xpress SARS-CoV-2/FLU/RSV plus assay is intended as an aid in the diagnosis of influenza from Nasopharyngeal swab specimens and should not be used as a sole basis for treatment. Nasal washings and aspirates are unacceptable for Xpert Xpress SARS-CoV-2/FLU/RSV testing.  Fact Sheet for Patients: BloggerCourse.com  Fact Sheet for Healthcare Providers: SeriousBroker.it  This test is not yet approved or cleared by the [place]  FDA and has been authorized for detection and/or diagnosis of SARS-CoV-2 by FDA under an Emergency Use Authorization (EUA). This EUA will remain in effect (meaning this test can be used) for the duration of the COVID-19 declaration under Section 564(b)(1) of the Act, 21 U.S.C. section 360bbb-3(b)(1), unless the authorization is terminated or revoked.     Resp Syncytial Virus by PCR NEGATIVE NEGATIVE Final    Comment: (NOTE) Fact Sheet for Patients: BloggerCourse.com  Fact Sheet for Healthcare Providers: SeriousBroker.it  This test is not yet approved or cleared by the [place]  FDA and has  been authorized for detection and/or diagnosis of SARS-CoV-2 by FDA under an Emergency Use Authorization (EUA). This EUA will remain in effect (meaning this test can be used) for the duration of the COVID-19 declaration under Section 564(b)(1) of the Act, 21 U.S.C. section 360bbb-3(b)(1), unless the authorization is terminated or revoked.  Performed at [facility], [address], [place], [place] [address]          Radiology Studies: CT CHEST ABDOMEN PELVIS W CONTRAST Result Date: 10/05/2023 CLINICAL DATA:  Left lung cancer.  Fever with nausea and vomiting. * Tracking Code: BO * EXAM: CT CHEST, ABDOMEN, AND PELVIS WITH CONTRAST TECHNIQUE: Multidetector CT imaging of the chest, abdomen and pelvis was performed following the standard  protocol during bolus administration of intravenous contrast. RADIATION DOSE REDUCTION: This exam was performed according to the departmental dose-optimization program which includes automated exposure control, adjustment of the mA and/or kV according to patient size and/or use of iterative reconstruction technique. CONTRAST:  75mL OMNIPAQUE IOHEXOL 350 MG/ML SOLN COMPARISON:  08/20/2023 FINDINGS: CT CHEST FINDINGS Cardiovascular: Ascending thoracic aortic aneurysm stable at 4.0 cm diameter. Mediastinum/Nodes: Small thyroid nodules measuring up to 1.0 cm in diameter. Not clinically significant; no follow-up imaging recommended (ref: J Am Coll Radiol. 2015 Feb;12(2): 143-50). No pathologic thoracic adenopathy.  Mild gynecomastia. Lungs/Pleura: Left upper lobectomy. Increased patchy airspace opacity scattered in the left lower lobe favoring pneumonia. New gas in the left pleural fluid collection both anteriorly and in the sub pulmonic fluid; if the patient has had recent thoracentesis then this may be incidental pneumothorax component, but otherwise the possibility of early empyema with gas-forming organism might be considered. No pneumomediastinum. Musculoskeletal: Thoracic spondylosis. CT ABDOMEN PELVIS FINDINGS Hepatobiliary: Unremarkable Pancreas: Unremarkable Spleen: Unremarkable Adrenals/Urinary Tract: Stable 1.3 by 1.7 cm left adrenal mass shown to be an adenoma on 12/09/2022. No follow up is indicated. Mild scarring in the left mid kidney anteriorly. Stomach/Bowel: Thickened appendix mildly worsened from previous, up to 1.1 cm in diameter. Cannot exclude appendicitis or appendiceal mucocele. Trace stranding adjacent to the appendiceal tip for example on image 78 series 3. Vascular/Lymphatic: Left common iliac vein stent noted. No pathologic adenopathy in the abdomen. Reproductive: Unremarkable Other: No supplemental non-categorized findings. Musculoskeletal: Bridging spurring at the sacroiliac joints  anteriorly. Lower lumbar spondylosis and degenerative disc disease. IMPRESSION: 1. Increased patchy airspace opacity scattered in the left lower lobe favoring pneumonia. 2. New gas in the left pleural fluid collection both anteriorly and in the subpulmonic fluid; if the patient has had recent thoracentesis then this may be incidental pneumothorax component, but otherwise the possibility of early empyema with gas-forming organism might be considered. No pneumomediastinum observed. 3. Thickened appendix mildly worsened from previous, up to 1.1 cm in diameter. Cannot exclude appendicitis or appendiceal mucocele. Trace stranding adjacent to the appendiceal tip. 4. Stable ascending thoracic aortic aneurysm at 4.0 cm diameter. This can likely be followed in the context of the patient's oncology imaging. Otherwise, recommend annual imaging followup by CTA or MRA. This recommendation follows 2010 ACCF/AHA/AATS/ACR/ASA/SCA/SCAI/SIR/STS/SVM Guidelines for the Diagnosis and Management of Patients with Thoracic Aortic Disease. Circulation. 2010; 121: G956-O130. Aortic aneurysm NOS (ICD10-I71.9) 5. Stable left adrenal adenoma. 6. Mild gynecomastia. 7. Lower lumbar spondylosis and degenerative disc disease. 8. Bridging spurring at the sacroiliac joints anteriorly. These results were called by telephone at the time of interpretation on 10/05/2023 at 10:01 pm to provider [facility] , who verbally acknowledged these results. Electronically Signed   By: Freida Jes  M.D.   On: 10/05/2023 22:07   DG Chest 2 View Result Date: 10/05/2023 CLINICAL DATA:  Suspected sepsis.  Fever, nausea, vomiting EXAM: CHEST - 2 VIEW COMPARISON:  05/05/2023.  CT 08/20/2023 FINDINGS: Heart and mediastinal contours within normal limits. Right lung clear. Volume loss on the left with small loculated left pleural effusion as seen on prior CT. No definite confluent airspace opacity. Postoperative changes on the left. No acute bony abnormality.  IMPRESSION: Postoperative changes on the left with volume loss and small loculated left pleural effusion. Electronically Signed   By: Janeece Mechanic M.D.   On: 10/05/2023 21:27   Scheduled Meds:  atorvastatin  40 mg Oral q AM   insulin aspart  0-9 Units Subcutaneous Q4H   Continuous Infusions:  ceFEPime (MAXIPIME) IV     lactated ringers 100 mL/hr at 10/06/23 4098   metronidazole       LOS: 0 days   Time spent:  Haydee Lipa, DO [facility]  If 7PM-7AM, please contact night-coverage [email or web address]  10/06/2023, 7:00 AM

## 2023-10-07 ENCOUNTER — Ambulatory Visit: Admitting: Internal Medicine

## 2023-10-07 DIAGNOSIS — J869 Pyothorax without fistula: Secondary | ICD-10-CM | POA: Diagnosis not present

## 2023-10-07 LAB — CBC WITH DIFFERENTIAL/PLATELET
Abs Immature Granulocytes: 0.1 10*3/uL — ABNORMAL HIGH (ref 0.00–0.07)
Basophils Absolute: 0 10*3/uL (ref 0.0–0.1)
Basophils Relative: 0 %
Eosinophils Absolute: 0.2 10*3/uL (ref 0.0–0.5)
Eosinophils Relative: 2 %
HCT: 24.5 % — ABNORMAL LOW (ref 39.0–52.0)
Hemoglobin: 8.4 g/dL — ABNORMAL LOW (ref 13.0–17.0)
Immature Granulocytes: 1 %
Lymphocytes Relative: 10 %
Lymphs Abs: 1.1 10*3/uL (ref 0.7–4.0)
MCH: 30.2 pg (ref 26.0–34.0)
MCHC: 34.3 g/dL (ref 30.0–36.0)
MCV: 88.1 fL (ref 80.0–100.0)
Monocytes Absolute: 0.8 10*3/uL (ref 0.1–1.0)
Monocytes Relative: 8 %
Neutro Abs: 8.6 10*3/uL — ABNORMAL HIGH (ref 1.7–7.7)
Neutrophils Relative %: 79 %
Platelets: 178 10*3/uL (ref 150–400)
RBC: 2.78 MIL/uL — ABNORMAL LOW (ref 4.22–5.81)
RDW: 14.8 % (ref 11.5–15.5)
WBC: 10.8 10*3/uL — ABNORMAL HIGH (ref 4.0–10.5)
nRBC: 0.2 % (ref 0.0–0.2)

## 2023-10-07 LAB — CBC
HCT: 19.7 % — ABNORMAL LOW (ref 39.0–52.0)
Hemoglobin: 6.6 g/dL — CL (ref 13.0–17.0)
MCH: 30.6 pg (ref 26.0–34.0)
MCHC: 33.5 g/dL (ref 30.0–36.0)
MCV: 91.2 fL (ref 80.0–100.0)
Platelets: 170 10*3/uL (ref 150–400)
RBC: 2.16 MIL/uL — ABNORMAL LOW (ref 4.22–5.81)
RDW: 14.8 % (ref 11.5–15.5)
WBC: 10.9 10*3/uL — ABNORMAL HIGH (ref 4.0–10.5)
nRBC: 0 % (ref 0.0–0.2)

## 2023-10-07 LAB — GLUCOSE, CAPILLARY
Glucose-Capillary: 106 mg/dL — ABNORMAL HIGH (ref 70–99)
Glucose-Capillary: 114 mg/dL — ABNORMAL HIGH (ref 70–99)
Glucose-Capillary: 116 mg/dL — ABNORMAL HIGH (ref 70–99)
Glucose-Capillary: 118 mg/dL — ABNORMAL HIGH (ref 70–99)
Glucose-Capillary: 162 mg/dL — ABNORMAL HIGH (ref 70–99)
Glucose-Capillary: 78 mg/dL (ref 70–99)

## 2023-10-07 LAB — BASIC METABOLIC PANEL WITH GFR
Anion gap: 6 (ref 5–15)
BUN: 16 mg/dL (ref 8–23)
CO2: 21 mmol/L — ABNORMAL LOW (ref 22–32)
Calcium: 8.7 mg/dL — ABNORMAL LOW (ref 8.9–10.3)
Chloride: 106 mmol/L (ref 98–111)
Creatinine, Ser: 1.53 mg/dL — ABNORMAL HIGH (ref 0.61–1.24)
GFR, Estimated: 49 mL/min — ABNORMAL LOW (ref >60)
Glucose, Bld: 76 mg/dL (ref 70–99)
Potassium: 4.4 mmol/L (ref 3.5–5.1)
Sodium: 133 mmol/L — ABNORMAL LOW (ref 135–145)

## 2023-10-07 LAB — PREPARE RBC (CROSSMATCH)

## 2023-10-07 MED ORDER — SODIUM CHLORIDE 0.9 % IV SOLN
2.0000 g | Freq: Three times a day (TID) | INTRAVENOUS | Status: DC
  Administered 2023-10-07 – 2023-10-08 (×3): 2 g via INTRAVENOUS
  Filled 2023-10-07 (×3): qty 12.5

## 2023-10-07 MED ORDER — SODIUM CHLORIDE 0.9% IV SOLUTION: Freq: Once | INTRAVENOUS | Status: AC

## 2023-10-07 MED ORDER — ENOXAPARIN SODIUM 40 MG/0.4ML IJ SOSY
40.0000 mg | PREFILLED_SYRINGE | INTRAMUSCULAR | Status: DC
  Administered 2023-10-07: 40 mg via SUBCUTANEOUS
  Filled 2023-10-07: qty 0.4

## 2023-10-07 NOTE — Progress Notes (Signed)
 PROGRESS NOTE    William Mcguire  ZOX:[id number] DOB: 08/30/1955 DOA: 10/05/2023 PCP: [name], MD   Brief Narrative:  Patient presented to the ED for evaluation of fever, chills, nausea, and vomiting -found to meet sepsis criteria with concern for left lower lobe pneumonia with scant pleural effusion versus empyema.  PCCM and IR requested to evaluate, unfortunately no notable volume for thoracentesis or chest tube placement.  Assessment & Plan:   Principal Problem:   Empyema (HCC) Active Problems:   Adenocarcinoma of left lung (HCC)   T2DM (type 2 diabetes mellitus) (HCC)   AKI (acute kidney injury) (HCC)   HLD (hyperlipidemia)   Severe sepsis (HCC)   Pneumothorax   Severe sepsis secondary to left lower lobe pneumonia/empyema, presumed aspiration Lactic acidosis, resolved ?Small pneumothorax -Patient is not hypoxic, was placed on oxygen for comfort -Continue antibiotic coverage for presumed pneumonia, viral panel remains negative -PCCM/IR evaluated but no indication for thoracentesis or chest tube placement at this time -Continue cefepime, Flagyl for presumed aspiration in the setting of recent nausea vomiting  Rule out appendicitis versus appendiceal mucocele -No clinical signs or symptoms of appendicitis, noted incidentally on imaging -General Surgery following, no clear indication for intervention at this time  AKI on CKD stage IIIa [place] acidosis, improving -Likely prerenal from dehydration.  Creatinine 1.9, baseline 1.3-1.5. -Continue IV fluid hydration and monitor renal function.  Avoid nephrotoxic agents/hold enalapril.  Acute on chronic symptomatic anemia, rule out bleeding/blood loss - Patient and wife deny any signs or symptoms of bleeding - Recent colonoscopy unremarkable per wife at bedside - Transfusion ordered (2u) - repeat H/H remains low patient has atypical antibody, expected delay in transfusion - Likely acute hemodilutional anemia on chronic  anemia of chronic disease(CKD/lung cancer)  Stage IIa non-small cell lung cancer (adenocarcinoma) Status post right upper lobectomy in September 2024 and finished chemotherapy in January.  Outpatient oncology follow-up.   History of PE and extensive left lower extremity DVT in January 2025 status post mechanical thrombectomy and iliac vein stenting Hold Eliquis given above worsening anemia, follow-up for bleeding   Insulin-dependent type 2 diabetes A1c 8.5 on 07/09/2023.   Hypoglycemic protocol enacted given n.p.o. status pending possible procedure Advance diet as tolerated   Hypertension Hold home medications in the setting of hypotension, improving   Hyperlipidemia Continue Lipitor.   Incidental AAA CT showing stable ascending thoracic aortic aneurysm at 4.0 cm diameter.  Radiologist recommending annual imaging follow-up by CTA or MRA.  DVT prophylaxis: Lovenox per pharmacy Code Status:   Code Status: Full Code Family Communication: Wife at bedside  Status is: Inpatient  Dispo: The patient is from: Home              Anticipated d/c is to: Home              Anticipated d/c date is: 48 to 72 hours              Patient currently not medically stable for discharge  Consultants:  PCCM, IR  Procedures:  None  Antimicrobials:  Cefepime, Flagyl  Subjective: No acute issues or events overnight, patient feels moderately improved from previous, no further episodes of nausea or vomiting, denies cough fever chills headache shortness of breath chest pain diarrhea constipation  Objective: Vitals:   10/06/23 2200 10/07/23 0448 10/07/23 0738 10/07/23 1107  BP: 118/72 134/82 127/72 124/74  Pulse: 83 98 77   Resp: 18 (!) 24 19 16   Temp: 99.8 F (37.7 C) 99.1 F (  37.3 C) 98.8 F (37.1 C) 98.7 F (37.1 C)  TempSrc: Oral Oral Oral Oral  SpO2: 100% 100% 100% 99%  Weight:      Height:        Intake/Output Summary (Last 24 hours) at 10/07/2023 1241 Last data filed at 10/07/2023  0936 Gross per 24 hour  Intake 1839.51 ml  Output 2675 ml  Net -835.49 ml   Filed Weights   10/05/23 2139 10/06/23 1525  Weight: 98.9 kg 104.1 kg    Examination:  General:  Pleasantly resting in bed, No acute distress. HEENT:  Normocephalic atraumatic.  Sclerae nonicteric, noninjected.  Extraocular movements intact bilaterally. Neck:  Without mass or deformity.  Trachea is midline. Lungs: Left-sided rhonchi without overt wheeze, rales. Heart:  Regular rate and rhythm.  Without murmurs, rubs, or gallops. Abdomen:  Soft, nontender, nondistended.  Without guarding or rebound. Extremities: Without cyanosis, clubbing, edema, or obvious deformity. Skin:  Warm and dry, no erythema.   Data Reviewed: I have personally reviewed following labs and imaging studies  CBC: Recent Labs  Lab 10/05/23 0939 10/05/23 1940 10/06/23 0616 10/06/23 0946 10/07/23 0304  WBC 6.4 5.1 10.9*  --  10.9*  NEUTROABS 4.0 4.6  --   --   --   HGB 8.1* 7.8* 6.5* 6.9* 6.6*  HCT 24.6* 23.7* 19.7* 21.4* 19.7*  MCV 91.1 91.9 92.9  --  91.2  PLT 214 209 177  --  170   Basic Metabolic Panel: Recent Labs  Lab 10/05/23 0939 10/05/23 1940 10/06/23 0616 10/07/23 0304  NA 135 136 136 133*  K 4.6 5.0 4.8 4.4  CL 106 107 109 106  CO2 25 20* 20* 21*  GLUCOSE 116* 129* 88 76  BUN 16 20 19 16   CREATININE 1.46* 1.94* 2.03* 1.53*  CALCIUM 9.7 9.3 8.6* 8.7*   GFR: Estimated Creatinine Clearance: 59.5 mL/min (A) (by C-G formula based on SCr of 1.53 mg/dL (H)). Liver Function Tests: Recent Labs  Lab 10/05/23 0939 10/05/23 1940  AST 16 24  ALT 14 16  ALKPHOS 150* 109  BILITOT 0.4 0.7  PROT 9.1* 8.7*  ALBUMIN 3.8 3.2*   Coagulation Profile: Recent Labs  Lab 10/05/23 1940  INR 1.6*   CBG: Recent Labs  Lab 10/06/23 1639 10/06/23 1952 10/07/23 0448 10/07/23 0745 10/07/23 1104  GLUCAP 99 109* 78 118* 114*   Sepsis Labs: Recent Labs  Lab 10/05/23 1946 10/05/23 2215  LATICACIDVEN 2.4* 1.2     Recent Results (from the past 240 hours)  Culture, blood (Routine x 2)     Status: None (Preliminary result)   Collection Time: 10/05/23  7:40 PM   Specimen: BLOOD  Result Value Ref Range Status   Specimen Description BLOOD LEFT ANTECUBITAL  Final   Special Requests   Final    BOTTLES DRAWN AEROBIC AND ANAEROBIC Blood Culture adequate volume   Culture   Final    NO GROWTH 2 DAYS Performed at [facility], [address], [place], [place] [address]    Report Status PENDING  Incomplete  Culture, blood (Routine x 2)     Status: None (Preliminary result)   Collection Time: 10/05/23 10:02 PM   Specimen: BLOOD  Result Value Ref Range Status   Specimen Description BLOOD RIGHT ANTECUBITAL  Final   Special Requests   Final    BOTTLES DRAWN AEROBIC AND ANAEROBIC Blood Culture results may not be optimal due to an inadequate volume of blood received in culture bottles  Culture   Final    NO GROWTH 2 DAYS Performed at [facility], [address], [place], [place] [address]    Report Status PENDING  Incomplete  Resp panel by RT-PCR (RSV, Flu A&B, Covid)     Status: None   Collection Time: 10/05/23 10:02 PM   Specimen: Nasal Swab  Result Value Ref Range Status   SARS Coronavirus 2 by RT PCR NEGATIVE NEGATIVE Final   Influenza A by PCR NEGATIVE NEGATIVE Final   Influenza B by PCR NEGATIVE NEGATIVE Final    Comment: (NOTE) The Xpert Xpress SARS-CoV-2/FLU/RSV plus assay is intended as an aid in the diagnosis of influenza from Nasopharyngeal swab specimens and should not be used as a sole basis for treatment. Nasal washings and aspirates are unacceptable for Xpert Xpress SARS-CoV-2/FLU/RSV testing.  Fact Sheet for Patients: BloggerCourse.com  Fact Sheet for Healthcare Providers: SeriousBroker.it  This test is not yet approved or cleared by the [place] FDA and has been authorized for detection and/or diagnosis of  SARS-CoV-2 by FDA under an Emergency Use Authorization (EUA). This EUA will remain in effect (meaning this test can be used) for the duration of the COVID-19 declaration under Section 564(b)(1) of the Act, 21 U.S.C. section 360bbb-3(b)(1), unless the authorization is terminated or revoked.     Resp Syncytial Virus by PCR NEGATIVE NEGATIVE Final    Comment: (NOTE) Fact Sheet for Patients: BloggerCourse.com  Fact Sheet for Healthcare Providers: SeriousBroker.it  This test is not yet approved or cleared by the [place] FDA and has been authorized for detection and/or diagnosis of SARS-CoV-2 by FDA under an Emergency Use Authorization (EUA). This EUA will remain in effect (meaning this test can be used) for the duration of the COVID-19 declaration under Section 564(b)(1) of the Act, 21 U.S.C. section 360bbb-3(b)(1), unless the authorization is terminated or revoked.  Performed at [facility], [address], [place], [place] [address]   MRSA Next Gen by PCR, Nasal     Status: None   Collection Time: 10/06/23  3:46 PM   Specimen: Nasal Mucosa; Nasal Swab  Result Value Ref Range Status   MRSA by PCR Next Gen NOT DETECTED NOT DETECTED Final    Comment: (NOTE) The GeneXpert MRSA Assay (FDA approved for NASAL specimens only), is one component of a comprehensive MRSA colonization surveillance program. It is not intended to diagnose MRSA infection nor to guide or monitor treatment for MRSA infections. Test performance is not FDA approved in patients less than 56 years old. Performed at [facility], [address] [place], [place] [address]          Radiology Studies: IR US CHEST Result Date: 10/06/2023 CLINICAL DATA:  Pleural effusions, assess thoracentesis. EXAM: CHEST ULTRASOUND COMPARISON:  10/05/2023 CT chest FINDINGS: Ultrasound performed of the posterior chest. No significant right effusion by ultrasound.  Trace small left effusion with left lower lobe atelectasis. There is not enough effusion to warrant thoracentesis. Procedure not performed. IMPRESSION: Trace small left effusion by ultrasound. Electronically Signed   By: Judie Petit.  Shick M.D.   On: 10/06/2023 16:31   CT CHEST ABDOMEN PELVIS W CONTRAST Result Date: 10/05/2023 CLINICAL DATA:  Left lung cancer.  Fever with nausea and vomiting. * Tracking Code: BO * EXAM: CT CHEST, ABDOMEN, AND PELVIS WITH CONTRAST TECHNIQUE: Multidetector CT imaging of the chest, abdomen and pelvis was performed following the standard protocol during bolus administration of intravenous contrast. RADIATION DOSE REDUCTION: This exam was  performed according to the departmental dose-optimization program which includes automated exposure control, adjustment of the mA and/or kV according to patient size and/or use of iterative reconstruction technique. CONTRAST:  75mL OMNIPAQUE IOHEXOL 350 MG/ML SOLN COMPARISON:  08/20/2023 FINDINGS: CT CHEST FINDINGS Cardiovascular: Ascending thoracic aortic aneurysm stable at 4.0 cm diameter. Mediastinum/Nodes: Small thyroid nodules measuring up to 1.0 cm in diameter. Not clinically significant; no follow-up imaging recommended (ref: J Am Coll Radiol. 2015 Feb;12(2): 143-50). No pathologic thoracic adenopathy.  Mild gynecomastia. Lungs/Pleura: Left upper lobectomy. Increased patchy airspace opacity scattered in the left lower lobe favoring pneumonia. New gas in the left pleural fluid collection both anteriorly and in the sub pulmonic fluid; if the patient has had recent thoracentesis then this may be incidental pneumothorax component, but otherwise the possibility of early empyema with gas-forming organism might be considered. No pneumomediastinum. Musculoskeletal: Thoracic spondylosis. CT ABDOMEN PELVIS FINDINGS Hepatobiliary: Unremarkable Pancreas: Unremarkable Spleen: Unremarkable Adrenals/Urinary Tract: Stable 1.3 by 1.7 cm left adrenal mass shown to be an  adenoma on 12/09/2022. No follow up is indicated. Mild scarring in the left mid kidney anteriorly. Stomach/Bowel: Thickened appendix mildly worsened from previous, up to 1.1 cm in diameter. Cannot exclude appendicitis or appendiceal mucocele. Trace stranding adjacent to the appendiceal tip for example on image 78 series 3. Vascular/Lymphatic: Left common iliac vein stent noted. No pathologic adenopathy in the abdomen. Reproductive: Unremarkable Other: No supplemental non-categorized findings. Musculoskeletal: Bridging spurring at the sacroiliac joints anteriorly. Lower lumbar spondylosis and degenerative disc disease. IMPRESSION: 1. Increased patchy airspace opacity scattered in the left lower lobe favoring pneumonia. 2. New gas in the left pleural fluid collection both anteriorly and in the subpulmonic fluid; if the patient has had recent thoracentesis then this may be incidental pneumothorax component, but otherwise the possibility of early empyema with gas-forming organism might be considered. No pneumomediastinum observed. 3. Thickened appendix mildly worsened from previous, up to 1.1 cm in diameter. Cannot exclude appendicitis or appendiceal mucocele. Trace stranding adjacent to the appendiceal tip. 4. Stable ascending thoracic aortic aneurysm at 4.0 cm diameter. This can likely be followed in the context of the patient's oncology imaging. Otherwise, recommend annual imaging followup by CTA or MRA. This recommendation follows 2010 ACCF/AHA/AATS/ACR/ASA/SCA/SCAI/SIR/STS/SVM Guidelines for the Diagnosis and Management of Patients with Thoracic Aortic Disease. Circulation. 2010; 121: U981-X914. Aortic aneurysm NOS (ICD10-I71.9) 5. Stable left adrenal adenoma. 6. Mild gynecomastia. 7. Lower lumbar spondylosis and degenerative disc disease. 8. Bridging spurring at the sacroiliac joints anteriorly. These results were called by telephone at the time of interpretation on 10/05/2023 at 10:01 pm to provider [facility]  , who verbally acknowledged these results. Electronically Signed   By: Gaylyn Rong M.D.   On: 10/05/2023 22:07   DG Chest 2 View Result Date: 10/05/2023 CLINICAL DATA:  Suspected sepsis.  Fever, nausea, vomiting EXAM: CHEST - 2 VIEW COMPARISON:  05/05/2023.  CT 08/20/2023 FINDINGS: Heart and mediastinal contours within normal limits. Right lung clear. Volume loss on the left with small loculated left pleural effusion as seen on prior CT. No definite confluent airspace opacity. Postoperative changes on the left. No acute bony abnormality. IMPRESSION: Postoperative changes on the left with volume loss and small loculated left pleural effusion. Electronically Signed   By: Charlett Nose M.D.   On: 10/05/2023 21:27   Scheduled Meds:  atorvastatin  40 mg Oral q AM   insulin aspart  0-9 Units Subcutaneous Q4H   Continuous Infusions:  ceFEPime (MAXIPIME) IV 2 g (10/07/23 0858)  metronidazole 500 mg (10/07/23 0936)     LOS: 1 day   Time spent:  Haydee Lipa, DO [facility]  If 7PM-7AM, please contact night-coverage [email or web address]  10/07/2023, 12:41 PM

## 2023-10-07 NOTE — Progress Notes (Signed)
 Blood transfusion completed. RN assessment completed. Pt denies any symptoms of transfusion reaction. PT 100% on RA and no acute distress noted at this time.   Post transfusion hgb 8.4. Pt denies any acute distress.

## 2023-10-07 NOTE — Plan of Care (Signed)
  Problem: Metabolic: Goal: Ability to maintain appropriate glucose levels will improve Outcome: Progressing   Problem: Nutritional: Goal: Progress toward achieving an optimal weight will improve Outcome: Progressing   Problem: Tissue Perfusion: Goal: Adequacy of tissue perfusion will improve Outcome: Progressing   Problem: Education: Goal: Knowledge of General Education information will improve Description: Including pain rating scale, medication(s)/side effects and non-pharmacologic comfort measures Outcome: Progressing   Problem: Clinical Measurements: Goal: Ability to maintain clinical measurements within normal limits will improve Outcome: Progressing Goal: Diagnostic test results will improve Outcome: Progressing Goal: Respiratory complications will improve Outcome: Progressing Goal: Cardiovascular complication will be avoided Outcome: Progressing   Problem: Nutrition: Goal: Adequate nutrition will be maintained Outcome: Progressing   Problem: Coping: Goal: Level of anxiety will decrease Outcome: Progressing   Problem: Elimination: Goal: Will not experience complications related to bowel motility Outcome: Progressing Goal: Will not experience complications related to urinary retention Outcome: Progressing   Problem: Pain Managment: Goal: General experience of comfort will improve and/or be controlled Outcome: Progressing   Problem: Safety: Goal: Ability to remain free from injury will improve Outcome: Progressing

## 2023-10-07 NOTE — Progress Notes (Signed)
 Pharmacy Antibiotic Note  William Mcguire is a 68 y.o. male admitted on 10/05/2023 with pneumonia.  Pharmacy has been consulted for cefepime and metronidazole dosing. WBC 11 crcl 12ml/min - improved   Plan: Increase Cefepime 2gm q8h  Continue metronidazole 500mg  IV q12h  Height: 6\' 3"  (190.5 cm) Weight: 104.1 kg (229 lb 8 oz) IBW/kg (Calculated) : 84.5  Temp (24hrs), Avg:99.1 F (37.3 C), Min:98.5 F (36.9 C), Max:99.8 F (37.7 C)  Recent Labs  Lab 10/05/23 0939 10/05/23 1940 10/05/23 1946 10/05/23 2215 10/06/23 0616 10/07/23 0304  WBC 6.4 5.1  --   --  10.9* 10.9*  CREATININE 1.46* 1.94*  --   --  2.03* 1.53*  LATICACIDVEN  --   --  2.4* 1.2  --   --     Estimated Creatinine Clearance: 59.5 mL/min (A) (by C-G formula based on SCr of 1.53 mg/dL (H)).    No Known Allergies  Antimicrobials this admission:   Dose adjustments this admission:   Microbiology results: 4/15 Bcx ngtd   Hortensia Ma Pharm.D. CPP, BCPS Clinical Pharmacist [phone] 10/07/2023 1:09 PM

## 2023-10-08 ENCOUNTER — Other Ambulatory Visit (HOSPITAL_COMMUNITY): Payer: Self-pay

## 2023-10-08 DIAGNOSIS — J869 Pyothorax without fistula: Secondary | ICD-10-CM | POA: Diagnosis not present

## 2023-10-08 LAB — CBC
HCT: 25.4 % — ABNORMAL LOW (ref 39.0–52.0)
Hemoglobin: 8.5 g/dL — ABNORMAL LOW (ref 13.0–17.0)
MCH: 29.3 pg (ref 26.0–34.0)
MCHC: 33.5 g/dL (ref 30.0–36.0)
MCV: 87.6 fL (ref 80.0–100.0)
Platelets: 175 10*3/uL (ref 150–400)
RBC: 2.9 MIL/uL — ABNORMAL LOW (ref 4.22–5.81)
RDW: 14.8 % (ref 11.5–15.5)
WBC: 9.9 10*3/uL (ref 4.0–10.5)
nRBC: 0 % (ref 0.0–0.2)

## 2023-10-08 LAB — TYPE AND SCREEN
ABO/RH(D): B POS
Antibody Screen: POSITIVE
DAT, IgG: POSITIVE
Donor AG Type: NEGATIVE
Donor AG Type: NEGATIVE
Unit division: 0
Unit division: 0

## 2023-10-08 LAB — BASIC METABOLIC PANEL WITH GFR
Anion gap: 7 (ref 5–15)
BUN: 14 mg/dL (ref 8–23)
CO2: 22 mmol/L (ref 22–32)
Calcium: 8.8 mg/dL — ABNORMAL LOW (ref 8.9–10.3)
Chloride: 105 mmol/L (ref 98–111)
Creatinine, Ser: 1.38 mg/dL — ABNORMAL HIGH (ref 0.61–1.24)
GFR, Estimated: 55 mL/min — ABNORMAL LOW (ref >60)
Glucose, Bld: 89 mg/dL (ref 70–99)
Potassium: 4.5 mmol/L (ref 3.5–5.1)
Sodium: 134 mmol/L — ABNORMAL LOW (ref 135–145)

## 2023-10-08 LAB — GLUCOSE, CAPILLARY
Glucose-Capillary: 85 mg/dL (ref 70–99)
Glucose-Capillary: 128 mg/dL — ABNORMAL HIGH (ref 70–99)
Glucose-Capillary: 96 mg/dL (ref 70–99)

## 2023-10-08 LAB — BPAM RBC
Blood Product Expiration Date: 202504302359
Blood Product Expiration Date: 202505022359
ISSUE DATE / TIME: 202504161409
ISSUE DATE / TIME: 202504161730
Unit Type and Rh: 7300
Unit Type and Rh: 7300

## 2023-10-08 MED ORDER — AMOXICILLIN-POT CLAVULANATE 875-125 MG PO TABS
1.0000 | ORAL_TABLET | Freq: Two times a day (BID) | ORAL | 0 refills | Status: AC
  Filled 2023-10-08 (×2): qty 8, 4d supply, fill #0

## 2023-10-08 MED ORDER — AMOXICILLIN-POT CLAVULANATE 875-125 MG PO TABS
1.0000 | ORAL_TABLET | Freq: Two times a day (BID) | ORAL | 0 refills | Status: DC
  Filled 2023-10-08: qty 8, 4d supply, fill #0

## 2023-10-08 MED ORDER — ENALAPRIL MALEATE 10 MG PO TABS
10.0000 mg | ORAL_TABLET | Freq: Two times a day (BID) | ORAL | 0 refills | Status: DC
  Filled 2023-10-08: qty 60, 30d supply, fill #0

## 2023-10-08 MED ORDER — ENALAPRIL MALEATE 10 MG PO TABS
10.0000 mg | ORAL_TABLET | Freq: Two times a day (BID) | ORAL | 0 refills | Status: DC
  Filled 2023-10-08 (×2): qty 60, 30d supply, fill #0

## 2023-10-08 NOTE — Discharge Summary (Signed)
 Physician Discharge Summary  William Mcguire JWJ:[id number] DOB: September 27, 1955 DOA: 10/05/2023  PCP: [name], MD  Admit date: 10/05/2023 Discharge date: 10/08/2023  Admitted From: Home Disposition: Home  Recommendations for Outpatient Follow-up:  Follow up with PCP in 1-2 weeks Follow-up with Dr. Liam Redhead as scheduled in 1 to 2 weeks  Home Health: None Equipment/Devices: None  Discharge Condition: Stable CODE STATUS: Full Diet recommendation: Low-salt low-fat diet  Brief/Interim Summary: Patient presented to the ED for evaluation of fever, chills, nausea, and vomiting -found to meet sepsis criteria with concern for left lower lobe pneumonia with scant pleural effusion versus empyema.  PCCM and IR requested to evaluate, unfortunately no notable volume for thoracentesis or chest tube placement.  Patient continued to improve over the last 48 hours, he did receive transfusion, not atypical given his known history, routinely receives transfusions with Dr. Liam Redhead his oncologist.  His questionable appendiceal mucocele versus appendicitis has been clinically irrelevant, completely asymptomatic, general surgery following from afar.  No indication for intervention at this time.  Patient's pneumonia does appear to be improving drastically, will transition to p.o. antibiotics with plan to discharge home later today.  Otherwise recommend close follow-up with PCP and oncology in the next 1 to 2 weeks for ongoing medical care, to further evaluate his hemoglobin and symptoms.  Wife indicates that prior to hospitalization patient had episode of near syncope in the yard while exerting himself, given notable normotension/borderline hypotension here while off home amlodipine it is likely patient had hypotensive episode at home.  Medication changes as below, instructed to follow blood pressure at home over the next week and report back to PCP with blood pressure readings while holding amlodipine and a decreased  dose of enalapril.  Discharge Diagnoses:  Principal Problem:   Empyema (HCC) Active Problems:   Adenocarcinoma of left lung (HCC)   T2DM (type 2 diabetes mellitus) (HCC)   AKI (acute kidney injury) (HCC)   HLD (hyperlipidemia)   Severe sepsis (HCC)   Pneumothorax  Severe sepsis secondary to left lower lobe pneumonia/presumed aspiration Lactic acidosis, resolved ?Small pneumothorax versus empyema unlikely - Patient was not hypoxic during hospitalization -Atypical findings likely in the setting of recent instrumentation and likely aspiration event at home -PCCM/IR initially consulted for possible thoracentesis or chest tube placement but given minimal fluid and no obvious empyema or free air decision was made to hold off on instrumentation.  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  Creatinine 1.9, baseline 1.3-1.5. - Resolved with increased p.o. intake and IV fluids, resume enalapril at lower dose   Acute on chronic symptomatic anemia, rule out bleeding/blood loss - Likely acute hemodilutional anemia on chronic anemia of chronic disease(CKD/lung cancer) -Now back to baseline after transfusion   Stage IIa non-small cell lung cancer (adenocarcinoma) Status post right upper lobectomy in September 2024 and finished chemotherapy in January.  Outpatient oncology follow-up.   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     Hypertension Stop amlodipine, decrease enalapril as discussed   Hyperlipidemia Continue Lipitor.   Incidental AAA CT showing stable ascending thoracic aortic aneurysm at  4.0 cm diameter.  Radiologist recommending annual imaging follow-up by CTA  or MRA.  Discharge Instructions  Discharge Instructions     Call MD for:  difficulty breathing, headache or visual disturbances   Complete by: As directed    Call MD for:  extreme fatigue   Complete by: As directed    Call MD for:  hives   Complete by: As directed    Call MD for:  persistant dizziness or light-headedness   Complete by: As directed    Call MD for:  persistant nausea and vomiting   Complete by: As directed    Call MD for:  severe uncontrolled pain   Complete by: As directed    Call MD for:  temperature >100.4   Complete by: As directed    Diet - low sodium heart healthy   Complete by: As directed    Increase activity slowly   Complete by: As directed       Allergies as of 10/08/2023   No Known Allergies      Medication List     STOP taking these medications    amLODipine 10 MG tablet Commonly known as: NORVASC       TAKE these medications    amoxicillin-clavulanate 875-125 MG tablet Commonly known as: AUGMENTIN Take 1 tablet by mouth 2 (two) times daily for 4 days.   apixaban 5 MG Tabs tablet Commonly known as: ELIQUIS Take 1 tablet (5 mg total) by mouth 2 (two) times daily.   atorvastatin 40 MG tablet Commonly known as: LIPITOR Take 1 tablet (40 mg total) by mouth daily. What changed: when to take this   B-D INTEGRA SYRINGE 25G X 5/8" 3 ML Misc Generic drug: SYRINGE-NEEDLE (DISP) 3 ML USE AS DIRECTED   B-D ULTRAFINE III SHORT PEN 31G X 8 MM Misc Generic drug: Insulin Pen Needle Inject into the skin as directed.   calcium carbonate 750 MG chewable tablet Commonly known as: TUMS EX Chew 3 tablets by mouth as needed for heartburn.   cyanocobalamin 1000 MCG/ML injection Commonly known as: VITAMIN B12 INJECT 1 ML (1,000 MCG TOTAL) INTO THE MUSCLE EVERY 30 DAYS. What changed: See the new instructions.   enalapril 10 MG tablet Commonly known as: VASOTEC Take  1 tablet (10 mg total) by mouth 2 (two) times daily. What changed:  medication strength how much to take Notes to patient: NOTE DOSAGE   folic acid 1 MG tablet Commonly known as: FOLVITE TAKE 1 TABLET BY MOUTH EVERY DAY   glucose blood test strip Use as instructed to test blood sugar 3 times a day. Dx code: e11.65   OneTouch Verio test strip Generic drug: glucose blood Use as instructed to check blood sugars twice daily E11.69   insulin glargine 100 UNIT/ML injection Commonly known as: LANTUS Inject 0.16 mLs (16 Units total) into the skin at bedtime. What changed: how much to take   sildenafil 100 MG tablet Commonly known as: VIAGRA TAKE 1 TABLET BY MOUTH EVERY DAY AS NEEDED (INSURANCE COVERS 10 TAB PER 77DAYS)   Trulicity 1.5 MG/0.5ML Soaj Generic drug: Dulaglutide Inject 1.5 mg into the skin every Sunday.        No Known Allergies  Consultations: None   Procedures/Studies: IR US CHEST Result Date: 10/06/2023 CLINICAL DATA:  Pleural effusions, assess thoracentesis. EXAM: CHEST ULTRASOUND COMPARISON:  10/05/2023 CT chest FINDINGS: Ultrasound performed of the posterior chest. No significant right effusion by ultrasound. Trace small left effusion with left lower lobe atelectasis. There is not enough effusion to warrant thoracentesis. Procedure not performed. IMPRESSION:  Trace small left effusion by ultrasound. Electronically Signed   By: Melven Stable.  Shick M.D.   On: 10/06/2023 16:31   CT CHEST ABDOMEN PELVIS W CONTRAST Result Date: 10/05/2023 CLINICAL DATA:  Left lung cancer.  Fever with nausea and vomiting. * Tracking Code: BO * EXAM: CT CHEST, ABDOMEN, AND PELVIS WITH CONTRAST TECHNIQUE: Multidetector CT imaging of the chest, abdomen and pelvis was performed following the standard protocol during bolus administration of intravenous contrast. RADIATION DOSE REDUCTION: This exam was performed according to the departmental dose-optimization program which includes automated exposure  control, adjustment of the mA and/or kV according to patient size and/or use of iterative reconstruction technique. CONTRAST:  75mL OMNIPAQUE IOHEXOL 350 MG/ML SOLN COMPARISON:  08/20/2023 FINDINGS: CT CHEST FINDINGS Cardiovascular: Ascending thoracic aortic aneurysm stable at 4.0 cm diameter. Mediastinum/Nodes: Small thyroid nodules measuring up to 1.0 cm in diameter. Not clinically significant; no follow-up imaging recommended (ref: J Am Coll Radiol. 2015 Feb;12(2): 143-50). No pathologic thoracic adenopathy.  Mild gynecomastia. Lungs/Pleura: Left upper lobectomy. Increased patchy airspace opacity scattered in the left lower lobe favoring pneumonia. New gas in the left pleural fluid collection both anteriorly and in the sub pulmonic fluid; if the patient has had recent thoracentesis then this may be incidental pneumothorax component, but otherwise the possibility of early empyema with gas-forming organism might be considered. No pneumomediastinum. Musculoskeletal: Thoracic spondylosis. CT ABDOMEN PELVIS FINDINGS Hepatobiliary: Unremarkable Pancreas: Unremarkable Spleen: Unremarkable Adrenals/Urinary Tract: Stable 1.3 by 1.7 cm left adrenal mass shown to be an adenoma on 12/09/2022. No follow up is indicated. Mild scarring in the left mid kidney anteriorly. Stomach/Bowel: Thickened appendix mildly worsened from previous, up to 1.1 cm in diameter. Cannot exclude appendicitis or appendiceal mucocele. Trace stranding adjacent to the appendiceal tip for example on image 78 series 3. Vascular/Lymphatic: Left common iliac vein stent noted. No pathologic adenopathy in the abdomen. Reproductive: Unremarkable Other: No supplemental non-categorized findings. Musculoskeletal: Bridging spurring at the sacroiliac joints anteriorly. Lower lumbar spondylosis and degenerative disc disease. IMPRESSION: 1. Increased patchy airspace opacity scattered in the left lower lobe favoring pneumonia. 2. New gas in the left pleural fluid  collection both anteriorly and in the subpulmonic fluid; if the patient has had recent thoracentesis then this may be incidental pneumothorax component, but otherwise the possibility of early empyema with gas-forming organism might be considered. No pneumomediastinum observed. 3. Thickened appendix mildly worsened from previous, up to 1.1 cm in diameter. Cannot exclude appendicitis or appendiceal mucocele. Trace stranding adjacent to the appendiceal tip. 4. Stable ascending thoracic aortic aneurysm at 4.0 cm diameter. This can likely be followed in the context of the patient's oncology imaging. Otherwise, recommend annual imaging followup by CTA or MRA. This recommendation follows 2010 ACCF/AHA/AATS/ACR/ASA/SCA/SCAI/SIR/STS/SVM Guidelines for the Diagnosis and Management of Patients with Thoracic Aortic Disease. Circulation. 2010; 121: W098-J191. Aortic aneurysm NOS (ICD10-I71.9) 5. Stable left adrenal adenoma. 6. Mild gynecomastia. 7. Lower lumbar spondylosis and degenerative disc disease. 8. Bridging spurring at the sacroiliac joints anteriorly. These results were called by telephone at the time of interpretation on 10/05/2023 at 10:01 pm to provider [facility] , who verbally acknowledged these results. Electronically Signed   By: Freida Jes M.D.   On: 10/05/2023 22:07   DG Chest 2 View Result Date: 10/05/2023 CLINICAL DATA:  Suspected sepsis.  Fever, nausea, vomiting EXAM: CHEST - 2 VIEW COMPARISON:  05/05/2023.  CT 08/20/2023 FINDINGS: Heart and mediastinal contours within normal limits. Right lung clear. Volume loss on the left with small loculated left  pleural effusion as seen on prior CT. No definite confluent airspace opacity. Postoperative changes on the left. No acute bony abnormality. IMPRESSION: Postoperative changes on the left with volume loss and small loculated left pleural effusion. Electronically Signed   By: Charlett Nose M.D.   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  Performing Technologist: Dorthula Matas RVS, RCS  Examination Guidelines: A complete evaluation includes B-mode imaging, spectral Doppler, color Doppler, and power Doppler as needed of all accessible portions of each vessel. Bilateral testing is considered an integral part of a complete examination. Limited examinations for reoccurring indications may be performed as noted. The reflux portion of the exam is performed with the patient in reverse Trendelenburg.  +---------+---------------+---------+-----------+---------------+--------------+ LEFT     CompressibilityPhasicitySpontaneityProperties     Thrombus Aging +---------+---------------+---------+-----------+---------------+--------------+ CFV      None                               partially      Age                                                        re-cannalized  Indeterminate  +---------+---------------+---------+-----------+---------------+--------------+ SFJ      Full                               partially      Age                                                        re-cannalized  Indeterminate  +---------+---------------+---------+-----------+---------------+--------------+ FV Prox  Partial                            partially      Age                                                        re-cannalized  Indeterminate   +---------+---------------+---------+-----------+---------------+--------------+ FV Mid   Partial                            partially      Age  re-cannalized  Indeterminate  +---------+---------------+---------+-----------+---------------+--------------+ FV DistalPartial                            partially      Age                                                        re-cannalized  Indeterminate  +---------+---------------+---------+-----------+---------------+--------------+ POP      None                               partially      Age                                                        re-cannalized  Indeterminate  +---------+---------------+---------+-----------+---------------+--------------+ PTV      Full                                                             +---------+---------------+---------+-----------+---------------+--------------+ PERO     Partial                                           Age                                                                       Indeterminate  +---------+---------------+---------+-----------+---------------+--------------+     Summary: LEFT: - Findings consistent with age indeterminate deep vein thrombosis involving the left common femoral vein, SF junction, femoral vein, popliteal vein, and peroneal veins. Thrombus appears to begin at the junction of the distal external iliac vein and the proximal common femoral vein. The left common iliac vein is widely patent.  *See table(s) above for measurements and observations. Electronically signed by Sherald Hess MD on 09/08/2023 at 8:56:57 AM.    Final    VAS [place] IVC/ILIAC (VENOUS ONLY) Result Date: 09/08/2023 IVC/ILIAC STUDY Patient Name:  GEORDIE NOONEY  Date of Exam:   09/08/2023 Medical Rec #: [id number]        Accession #:    [id number] Date of Birth: [birth year]        Patient Gender: M  Patient Age:   36 years Exam Location:  [facility] Procedure:      VAS [place] IVC/ILIAC (VENOUS ONLY) Referring Phys: [name] --------------------------------------------------------------------------------  Indications: Iliac vein stent evaluation Vascular Interventions: 06/26/2023 Left lower extremity iliofemoral DVT                         May-Thurner  syndrome. Limitations: Air/bowel gas.  Performing Technologist: Dorthula Matas RVS, RCS  Examination Guidelines: A complete evaluation includes B-mode imaging, spectral Doppler, color Doppler, and power Doppler as needed of all accessible portions of each vessel. Bilateral testing is considered an integral part of a complete examination. Limited examinations for reoccurring indications may be performed as noted.  IVC/Iliac Findings: +----------+------+--------+--------+    IVC    PatentThrombusComments +----------+------+--------+--------+ IVC Mid   patent                 +----------+------+--------+--------+ IVC Distalpatent                 +----------+------+--------+--------+  +-------------------+---------+-----------+---------+-----------+--------+         CIV        RT-PatentRT-ThrombusLT-PatentLT-ThrombusComments +-------------------+---------+-----------+---------+-----------+--------+ Common Iliac Prox                       patent                      +-------------------+---------+-----------+---------+-----------+--------+ Common Iliac Mid                        patent                      +-------------------+---------+-----------+---------+-----------+--------+ Common Iliac Distal                     patent                      +-------------------+---------+-----------+---------+-----------+--------+  +-------------------------+---------+-----------+---------+-----------+--------+            EIV           RT-PatentRT-ThrombusLT-PatentLT-ThrombusComments  +-------------------------+---------+-----------+---------+-----------+--------+ External Iliac Vein Prox                      patent                      +-------------------------+---------+-----------+---------+-----------+--------+ External Iliac Vein Mid                       patent                      +-------------------------+---------+-----------+---------+-----------+--------+ External Iliac Vein                           patent                      Distal                                                                    +-------------------------+---------+-----------+---------+-----------+--------+  Summary: IVC/Iliac: No evidence of thrombus in IVC and left Iliac veins. Widely patent common iliac vein stenting. Of note: Right CIA measures 1.40 x 1.48 cm Left CIA measures 1.44 x 1.48 cm *See table(s) above for measurements and observations.  Electronically signed by Sherald Hess MD on 09/08/2023 at 8:56:22 AM.    Final      Subjective: No acute issues or events overnight   Discharge Exam: Vitals:   10/08/23 0748 10/08/23 1115  BP: 129/76 128/79  Pulse:    Resp: 17 20  Temp: 98.7 F (37.1 C) 98.7 F (37.1 C)  SpO2: 97% 98%   Vitals:   10/07/23 2031 10/07/23 2354 10/08/23 0748 10/08/23 1115  BP: 131/74 122/75 129/76 128/79  Pulse:      Resp: 20 18 17 20   Temp:  98.6 F (37 C) 98.7 F (37.1 C) 98.7 F (37.1 C)  TempSrc:  Oral Oral Oral  SpO2:   97% 98%  Weight:      Height:        General: Pt is alert, awake, not in acute distress Cardiovascular: RRR, S1/S2 +, no rubs, no gallops Respiratory: CTA bilaterally, no wheezing, no rhonchi Abdominal: Soft, NT, ND, bowel sounds + Extremities: no edema, no cyanosis    The results of significant diagnostics from this hospitalization (including imaging, microbiology, ancillary and laboratory) are listed below for reference.     Microbiology: Recent Results (from the past 240 hours)   Culture, blood (Routine x 2)     Status: None (Preliminary result)   Collection Time: 10/05/23  7:40 PM   Specimen: BLOOD  Result Value Ref Range Status   Specimen Description BLOOD LEFT ANTECUBITAL  Final   Special Requests   Final    BOTTLES DRAWN AEROBIC AND ANAEROBIC Blood Culture adequate volume   Culture   Final    NO GROWTH 3 DAYS Performed at [facility], [address], [place], [place] [address]    Report Status PENDING  Incomplete  Culture, blood (Routine x 2)     Status: None (Preliminary result)   Collection Time: 10/05/23 10:02 PM   Specimen: BLOOD  Result Value Ref Range Status   Specimen Description BLOOD RIGHT ANTECUBITAL  Final   Special Requests   Final    BOTTLES DRAWN AEROBIC AND ANAEROBIC Blood Culture results may not be optimal due to an inadequate volume of blood received in culture bottles   Culture   Final    NO GROWTH 3 DAYS Performed at [facility], [address] [place], [place] [address]    Report Status PENDING  Incomplete  Resp panel by RT-PCR (RSV, Flu A&B, Covid)     Status: None   Collection Time: 10/05/23 10:02 PM   Specimen: Nasal Swab  Result Value Ref Range Status   SARS Coronavirus 2 by RT PCR NEGATIVE NEGATIVE Final   Influenza A by PCR NEGATIVE NEGATIVE Final   Influenza B by PCR NEGATIVE NEGATIVE Final    Comment: (NOTE) The Xpert Xpress SARS-CoV-2/FLU/RSV plus assay is intended as an aid in the diagnosis of influenza from Nasopharyngeal swab specimens and should not be used as a sole basis for treatment. Nasal washings and aspirates are unacceptable for Xpert Xpress SARS-CoV-2/FLU/RSV testing.  Fact Sheet for Patients: BloggerCourse.com  Fact Sheet for Healthcare Providers: SeriousBroker.it  This test is not yet approved or cleared by the [place] FDA and has been authorized for detection and/or diagnosis of SARS-CoV-2 by FDA under an Emergency Use  Authorization (EUA). This EUA will remain in effect (meaning this test can be used) for the duration of the COVID-19 declaration under Section 564(b)(1) of the Act, 21 U.S.C. section 360bbb-3(b)(1), unless the authorization is terminated or revoked.     Resp Syncytial Virus by PCR NEGATIVE NEGATIVE Final    Comment: (NOTE) Fact Sheet for Patients: BloggerCourse.com  Fact Sheet for Healthcare Providers: SeriousBroker.it  This test is not yet approved or cleared by  the [employer] and has been authorized for detection and/or diagnosis of SARS-CoV-2 by FDA under an Emergency Use Authorization (EUA). This EUA will remain in effect (meaning this test can be used) for the duration of the COVID-19 declaration under Section 564(b)(1) of the Act, 21 U.S.C. section 360bbb-3(b)(1), unless the authorization is terminated or revoked.  Performed at [facility], [address] [place], [place] [address]   MRSA Next Gen by PCR, Nasal     Status: None   Collection Time: 10/06/23  3:46 PM   Specimen: Nasal Mucosa; Nasal Swab  Result Value Ref Range Status   MRSA by PCR Next Gen NOT DETECTED NOT DETECTED Final    Comment: (NOTE) The GeneXpert MRSA Assay (FDA approved for NASAL specimens only), is one component of a comprehensive MRSA colonization surveillance program. It is not intended to diagnose MRSA infection nor to guide or monitor treatment for MRSA infections. Test performance is not FDA approved in patients less than 53 years old. Performed at [facility], [address], [place], [place] [address]      Labs: BNP (last 3 results) No results for input(s): "BNP" in the last 8760 hours. Basic Metabolic Panel: Recent Labs  Lab 10/05/23 0939 10/05/23 1940 10/06/23 0616 10/07/23 0304 10/08/23 0237  NA 135 136 136 133* 134*  K 4.6 5.0 4.8 4.4 4.5  CL 106 107 109 106 105  CO2 25 20* 20* 21* 22  GLUCOSE 116*  129* 88 76 89  BUN 16 20 19 16 14   CREATININE 1.46* 1.94* 2.03* 1.53* 1.38*  CALCIUM 9.7 9.3 8.6* 8.7* 8.8*   Liver Function Tests: Recent Labs  Lab 10/05/23 0939 10/05/23 1940  AST 16 24  ALT 14 16  ALKPHOS 150* 109  BILITOT 0.4 0.7  PROT 9.1* 8.7*  ALBUMIN 3.8 3.2*   No results for input(s): "LIPASE", "AMYLASE" in the last 168 hours. No results for input(s): "AMMONIA" in the last 168 hours. CBC: Recent Labs  Lab 10/05/23 0939 10/05/23 0939 10/05/23 1940 10/06/23 0616 10/06/23 0946 10/07/23 0304 10/07/23 2259 10/08/23 0237  WBC 6.4  --  5.1 10.9*  --  10.9* 10.8* 9.9  NEUTROABS 4.0  --  4.6  --   --   --  8.6*  --   HGB 8.1*   < > 7.8* 6.5* 6.9* 6.6* 8.4* 8.5*  HCT 24.6*  --  23.7* 19.7* 21.4* 19.7* 24.5* 25.4*  MCV 91.1  --  91.9 92.9  --  91.2 88.1 87.6  PLT 214  --  209 177  --  170 178 175   < > = values in this interval not displayed.   Cardiac Enzymes: No results for input(s): "CKTOTAL", "CKMB", "CKMBINDEX", "TROPONINI" in the last 168 hours. BNP: Invalid input(s): "POCBNP" CBG: Recent Labs  Lab 10/07/23 1947 10/07/23 2351 10/08/23 0436 10/08/23 0754 10/08/23 1114  GLUCAP 162* 116* 85 128* 96   D-Dimer No results for input(s): "DDIMER" in the last 72 hours. Hgb A1c No results for input(s): "HGBA1C" in the last 72 hours. Lipid Profile No results for input(s): "CHOL", "HDL", "LDLCALC", "TRIG", "CHOLHDL", "LDLDIRECT" in the last 72 hours. Thyroid function studies No results for input(s): "TSH", "T4TOTAL", "T3FREE", "THYROIDAB" in the last 72 hours.  Invalid input(s): "FREET3" Anemia work up No results for input(s): "VITAMINB12", "FOLATE", "FERRITIN", "TIBC", "IRON", "RETICCTPCT" in the last 72 hours. Urinalysis    Component Value Date/Time   COLORURINE YELLOW 10/05/2023 2155   APPEARANCEUR HAZY (  A) 10/05/2023 2155   LABSPEC 1.028 10/05/2023 2155   PHURINE 5.0 10/05/2023 2155   GLUCOSEU NEGATIVE 10/05/2023 2155   HGBUR NEGATIVE 10/05/2023  2155   BILIRUBINUR NEGATIVE 10/05/2023 2155   BILIRUBINUR Negative 08/18/2022 1616   KETONESUR NEGATIVE 10/05/2023 2155   PROTEINUR NEGATIVE 10/05/2023 2155   UROBILINOGEN 0.2 08/18/2022 1616   UROBILINOGEN 0.2 06/27/2011 1205   NITRITE NEGATIVE 10/05/2023 2155   LEUKOCYTESUR TRACE (A) 10/05/2023 2155   Sepsis Labs Recent Labs  Lab 10/06/23 0616 10/07/23 0304 10/07/23 2259 10/08/23 0237  WBC 10.9* 10.9* 10.8* 9.9   Microbiology Recent Results (from the past 240 hours)  Culture, blood (Routine x 2)     Status: None (Preliminary result)   Collection Time: 10/05/23  7:40 PM   Specimen: BLOOD  Result Value Ref Range Status   Specimen Description BLOOD LEFT ANTECUBITAL  Final   Special Requests   Final    BOTTLES DRAWN AEROBIC AND ANAEROBIC Blood Culture adequate volume   Culture   Final    NO GROWTH 3 DAYS Performed at [facility], [address], [place], [place] [address]    Report Status PENDING  Incomplete  Culture, blood (Routine x 2)     Status: None (Preliminary result)   Collection Time: 10/05/23 10:02 PM   Specimen: BLOOD  Result Value Ref Range Status   Specimen Description BLOOD RIGHT ANTECUBITAL  Final   Special Requests   Final    BOTTLES DRAWN AEROBIC AND ANAEROBIC Blood Culture results may not be optimal due to an inadequate volume of blood received in culture bottles   Culture   Final    NO GROWTH 3 DAYS Performed at [facility], [address] [place], [place] [address]    Report Status PENDING  Incomplete  Resp panel by RT-PCR (RSV, Flu A&B, Covid)     Status: None   Collection Time: 10/05/23 10:02 PM   Specimen: Nasal Swab  Result Value Ref Range Status   SARS Coronavirus 2 by RT PCR NEGATIVE NEGATIVE Final   Influenza A by PCR NEGATIVE NEGATIVE Final   Influenza B by PCR NEGATIVE NEGATIVE Final    Comment: (NOTE) The Xpert Xpress SARS-CoV-2/FLU/RSV plus assay is intended as an aid in the diagnosis of influenza from Nasopharyngeal  swab specimens and should not be used as a sole basis for treatment. Nasal washings and aspirates are unacceptable for Xpert Xpress SARS-CoV-2/FLU/RSV testing.  Fact Sheet for Patients: BloggerCourse.com  Fact Sheet for Healthcare Providers: SeriousBroker.it  This test is not yet approved or cleared by the [place]  FDA and has been authorized for detection and/or diagnosis of SARS-CoV-2 by FDA under an Emergency Use Authorization (EUA). This EUA will remain in effect (meaning this test can be used) for the duration of the COVID-19 declaration under Section 564(b)(1) of the Act, 21 U.S.C. section 360bbb-3(b)(1), unless the authorization is terminated or revoked.     Resp Syncytial Virus by PCR NEGATIVE NEGATIVE Final    Comment: (NOTE) Fact Sheet for Patients: BloggerCourse.com  Fact Sheet for Healthcare Providers: SeriousBroker.it  This test is not yet approved or cleared by the [place]  FDA and has been authorized for detection and/or diagnosis of SARS-CoV-2 by FDA under an Emergency Use Authorization (EUA). This EUA will remain in effect (meaning this test can be used) for the duration of the COVID-19 declaration under Section 564(b)(1) of the Act, 21 U.S.C. section 360bbb-3(b)(1), unless the authorization is terminated or revoked.  Performed  at [facility], [address], [place], [place] [address]   MRSA Next Gen by PCR, Nasal     Status: None   Collection Time: 10/06/23  3:46 PM   Specimen: Nasal Mucosa; Nasal Swab  Result Value Ref Range Status   MRSA by PCR Next Gen NOT DETECTED NOT DETECTED Final    Comment: (NOTE) The GeneXpert MRSA Assay (FDA approved for NASAL specimens only), is one component of a comprehensive MRSA colonization surveillance program. It is not intended to diagnose MRSA infection nor to guide or monitor treatment for MRSA  infections. Test performance is not FDA approved in patients less than 35 years old. Performed at [facility], [address], [place], [place] [address]      Time coordinating discharge: Over 30 minutes  SIGNED:   Haydee Lipa, DO [facility] 10/08/2023, 11:39 AM Pager   If 7PM-7AM, please contact night-coverage [email or web address]

## 2023-10-08 NOTE — Progress Notes (Signed)
 Patient alert and oriented verbalized understanding of dc instructions. All belongings and papework given to patient.

## 2023-10-08 NOTE — TOC Transition Note (Signed)
 Transition of Care [facility]) - Discharge Note   Patient Details  Name: William Mcguire MRN: [id number] Date of Birth: 09-Jul-1955  [facility] CM/SW Contact:  Jeani Mill, RN Phone Number: 10/08/2023, 12:28 PM   Clinical Narrative:     Spoke to patient regarding transition needs.  Patient lives home with wife and follows up with PCP on file.  No TOC needs at this time.   Final next level of care: Home/Self Care Barriers to Discharge: Barriers Resolved   Patient Goals and CMS Choice Patient states their goals for this hospitalization and ongoing recovery are:: return home with wife          Discharge Placement             Home          Discharge Plan and Services Additional resources added to the After Visit Summary for                                       Social Drivers of Health (SDOH) Interventions SDOH Screenings   Food Insecurity: No Food Insecurity (10/06/2023)  Housing: Low Risk  (10/06/2023)  Transportation Needs: No Transportation Needs (10/06/2023)  Utilities: Not At Risk (10/06/2023)  Depression (PHQ2-9): Low Risk  (06/04/2023)  Financial Resource Strain: Low Risk  (11/05/2022)  Physical Activity: Sufficiently Active (11/05/2022)  Social Connections: Moderately Integrated (10/06/2023)  Stress: No Stress Concern Present (11/05/2022)  Tobacco Use: Low Risk  (10/05/2023)  Health Literacy: Adequate Health Literacy (03/26/2023)     Readmission Risk Interventions    10/08/2023   12:28 PM  Readmission Risk Prevention Plan  Transportation Screening Complete  PCP or Specialist Appt within 3-5 Days Complete  HRI or Home Care Consult Complete  Social Work Consult for Recovery Care Planning/Counseling Complete  Palliative Care Screening Not Applicable  Medication Review Oceanographer) Complete

## 2023-10-08 NOTE — Progress Notes (Signed)
 AVS paperwork gone over with patient by swat RN , iv removed, patient belongings gathered and patient was wheeled out to family vehicle.

## 2023-10-08 NOTE — Care Management Important Message (Signed)
 Important Message  Patient Details  Name: William Mcguire MRN: [id number] Date of Birth: [birth year]   Important Message Given:  Yes - Medicare IM     [name] 10/08/2023, 4:16 PM

## 2023-10-08 NOTE — Care Management Important Message (Signed)
 Important Message  Patient Details  Name: William Mcguire MRN: [id number] Date of Birth: September 19, 1955   Important Message Given:  Yes - Medicare IM CORRECTION PATIENT LEFT PRIOR TO IM DELIVERY WILL SEND A LETTER TO THE PATIENT HOME ADDRESS.     Teretha Chalupa 10/08/2023, 4:29 PM

## 2023-10-08 NOTE — Plan of Care (Signed)

## 2023-10-09 ENCOUNTER — Telehealth: Payer: Self-pay | Admitting: *Deleted

## 2023-10-09 NOTE — Transitions of Care (Post Inpatient/ED Visit) (Signed)
   10/09/2023  Name: William Mcguire MRN: [id number] DOB: [birth year]  Today's TOC FU Call Status: Today's TOC FU Call Status:: Unsuccessful Call (1st Attempt) Unsuccessful Call (1st Attempt) Date: 10/09/23  Attempted to reach the patient regarding the most recent Inpatient/ED visit.  Follow Up Plan: Additional outreach attempts will be made to reach the patient to complete the Transitions of Care (Post Inpatient/ED visit) call.   Andrea Dimes RN, BSN [facility]  [facility] RN Care Manager [phone]

## 2023-10-10 LAB — CULTURE, BLOOD (ROUTINE X 2)
Culture: NO GROWTH
Culture: NO GROWTH
Special Requests: ADEQUATE

## 2023-10-12 ENCOUNTER — Encounter: Payer: Self-pay | Admitting: Physician Assistant

## 2023-10-12 ENCOUNTER — Encounter: Payer: Self-pay | Admitting: Internal Medicine

## 2023-10-12 ENCOUNTER — Telehealth: Payer: Self-pay

## 2023-10-12 ENCOUNTER — Telehealth: Payer: Self-pay | Admitting: *Deleted

## 2023-10-12 NOTE — Transitions of Care (Post Inpatient/ED Visit) (Signed)
   10/12/2023  Name: Ulises Wolfinger MRN: [id number] DOB: 06-14-1956  Today's TOC FU Call Status: Today's TOC FU Call Status:: Unsuccessful Call (2nd Attempt) Unsuccessful Call (2nd Attempt) Date: 10/12/23  Attempted to reach the patient regarding the most recent Inpatient/ED visit.  Follow Up Plan: Additional outreach attempts will be made to reach the patient to complete the Transitions of Care (Post Inpatient/ED visit) call.   Arna Better RN, BSN [place]  [facility] RN Care Manager [phone]

## 2023-10-12 NOTE — Transitions of Care (Post Inpatient/ED Visit) (Cosign Needed)
   10/12/2023  Name: William Mcguire MRN: [id number] DOB: 02-14-1956  Today's TOC FU Call Status:   Patient's Name and Date of Birth confirmed.  Transition Care Management Follow-up Telephone Call Date of Discharge: 10/08/23 Discharge Facility: [facility]) Type of Discharge: Inpatient Admission How have you been since you were released from the hospital?: Same Any questions or concerns?: No  Items Reviewed: Did you receive and understand the discharge instructions provided?: Yes Medications obtained,verified, and reconciled?: Yes (Medications Reviewed) Any new allergies since your discharge?: No Dietary orders reviewed?: NA Do you have support at home?: Yes People in Home [RPT]: spouse  Medications Reviewed Today: Medications Reviewed Today   Medications were not reviewed in this encounter     Home Care and Equipment/Supplies: Were Home Health Services Ordered?: No Any new equipment or medical supplies ordered?: No  Functional Questionnaire: Do you need assistance with bathing/showering or dressing?: No Do you need assistance with meal preparation?: No Do you need assistance with eating?: No Do you have difficulty maintaining continence: No Do you need assistance with getting out of bed/getting out of a chair/moving?: No Do you have difficulty managing or taking your medications?: No  Follow up appointments reviewed: PCP Follow-up appointment confirmed?: Yes Date of PCP follow-up appointment?: 10/19/23 Follow-up Provider: [name] Specialist [facility] Follow-up appointment confirmed?: NA Do you understand care options if your condition(s) worsen?: Yes-patient verbalized understanding    SIGNATURE Linnell Richardson, CMA

## 2023-10-13 ENCOUNTER — Telehealth: Payer: Self-pay | Admitting: *Deleted

## 2023-10-13 NOTE — Transitions of Care (Post Inpatient/ED Visit) (Signed)
   10/13/2023  Name: William Mcguire MRN: [id number] DOB: 07-02-1955  Today's TOC FU Call Status: Today's TOC FU Call Status:: Unsuccessful Call (3rd Attempt) Unsuccessful Call (3rd Attempt) Date: 10/13/23  Attempted to reach the patient regarding the most recent Inpatient/ED visit.  Follow Up Plan: No further outreach attempts will be made at this time. We have been unable to contact the patient.  Arna Better RN, BSN   [facility] RN Care Manager [phone]

## 2023-10-14 ENCOUNTER — Inpatient Hospital Stay: Admitting: Internal Medicine

## 2023-10-15 ENCOUNTER — Ambulatory Visit (HOSPITAL_COMMUNITY)

## 2023-10-15 ENCOUNTER — Other Ambulatory Visit (HOSPITAL_COMMUNITY)

## 2023-10-17 ENCOUNTER — Other Ambulatory Visit: Payer: Self-pay | Admitting: Radiology

## 2023-10-17 DIAGNOSIS — D472 Monoclonal gammopathy: Secondary | ICD-10-CM

## 2023-10-19 ENCOUNTER — Encounter: Payer: Self-pay | Admitting: Internal Medicine

## 2023-10-19 ENCOUNTER — Other Ambulatory Visit: Payer: Self-pay | Admitting: Radiology

## 2023-10-19 ENCOUNTER — Ambulatory Visit: Admitting: Internal Medicine

## 2023-10-19 VITALS — BP 132/80 | HR 69 | Temp 97.9°F | Ht 75.0 in | Wt 227.6 lb

## 2023-10-19 DIAGNOSIS — I129 Hypertensive chronic kidney disease with stage 1 through stage 4 chronic kidney disease, or unspecified chronic kidney disease: Secondary | ICD-10-CM | POA: Diagnosis not present

## 2023-10-19 DIAGNOSIS — A419 Sepsis, unspecified organism: Secondary | ICD-10-CM

## 2023-10-19 DIAGNOSIS — R6520 Severe sepsis without septic shock: Secondary | ICD-10-CM | POA: Diagnosis not present

## 2023-10-19 DIAGNOSIS — Z794 Long term (current) use of insulin: Secondary | ICD-10-CM

## 2023-10-19 DIAGNOSIS — D509 Iron deficiency anemia, unspecified: Secondary | ICD-10-CM

## 2023-10-19 DIAGNOSIS — N182 Chronic kidney disease, stage 2 (mild): Secondary | ICD-10-CM

## 2023-10-19 DIAGNOSIS — N179 Acute kidney failure, unspecified: Secondary | ICD-10-CM

## 2023-10-19 DIAGNOSIS — E1122 Type 2 diabetes mellitus with diabetic chronic kidney disease: Secondary | ICD-10-CM

## 2023-10-19 DIAGNOSIS — K388 Other specified diseases of appendix: Secondary | ICD-10-CM

## 2023-10-19 DIAGNOSIS — Z860109 Personal history of other colon polyps: Secondary | ICD-10-CM

## 2023-10-19 DIAGNOSIS — C3492 Malignant neoplasm of unspecified part of left bronchus or lung: Secondary | ICD-10-CM

## 2023-10-19 DIAGNOSIS — J869 Pyothorax without fistula: Secondary | ICD-10-CM | POA: Diagnosis not present

## 2023-10-19 DIAGNOSIS — I714 Abdominal aortic aneurysm, without rupture, unspecified: Secondary | ICD-10-CM

## 2023-10-19 NOTE — H&P (Incomplete)
 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 male with past medical history of non-small cell lung cancer 2024 with prior right upper lobectomy, AAA, hypertension, colon polyps, diabetes, hyperlipidemia, left lower extremity DVT/PE,  anemia, hypertension, recent pneumonia/empyema, CKD and MGUS/smoldering myeloma. He now presents with worsening labs concerning for multiple myeloma and is scheduled today for CT-guided bone marrow biopsy for further evaluation.  He is known to IR team from bone marrow biopsy in 2023.   *** Patient is Full Code  Past Medical History:  Diagnosis Date   Diabetes mellitus    High cholesterol    Hypertension     Past Surgical History:  Procedure Laterality Date   BIOPSY OF SKIN SUBCUTANEOUS TISSUE AND/OR MUCOUS MEMBRANE  09/29/2023   Procedure: BIOPSY, SKIN, SUBCUTANEOUS TISSUE, OR MUCOUS MEMBRANE;  Surgeon: Felecia Hopper, MD;  Location: [facility];  Service: Gastroenterology;;   BRONCHIAL BIOPSY  01/05/2023   Procedure: BRONCHIAL BIOPSIES;  Surgeon: Prudy Brownie, DO;  Location: [facility];  Service: Pulmonary;;   BRONCHIAL BRUSHINGS  01/05/2023   Procedure: BRONCHIAL BRUSHINGS;  Surgeon: Prudy Brownie, DO;  Location: [facility];  Service: Pulmonary;;   BRONCHIAL NEEDLE ASPIRATION BIOPSY  01/05/2023   Procedure: BRONCHIAL NEEDLE ASPIRATION BIOPSIES;  Surgeon: Prudy Brownie, DO;  Location: [facility];  Service: Pulmonary;;   COLONOSCOPY N/A 09/29/2023   Procedure: COLONOSCOPY;  Surgeon: Felecia Hopper, MD;  Location: [facility];  Service: Gastroenterology;  Laterality: N/A;    ESOPHAGOGASTRODUODENOSCOPY N/A 09/29/2023   Procedure: EGD (ESOPHAGOGASTRODUODENOSCOPY);  Surgeon: Felecia Hopper, MD;  Location: [facility];  Service: Gastroenterology;  Laterality: N/A;   INTERCOSTAL NERVE BLOCK Left 03/18/2023   Procedure: INTERCOSTAL NERVE BLOCK;  Surgeon: Hilarie Lovely, MD;  Location: [facility];  Service: Thoracic;  Laterality: Left;   KNEE SURGERY Left 2004   torn cartilage   LYMPH NODE BIOPSY Left 03/18/2023   Procedure: LYMPH NODE BIOPSY;  Surgeon: Hilarie Lovely, MD;  Location: [facility];  Service: Thoracic;  Laterality: Left;   PATELLAR TENDON REPAIR  06/11/2011   Procedure: PATELLA TENDON REPAIR;  Surgeon: Jasmine Mesi;  Location: [facility];  Service: Orthopedics;  Laterality: Right;   PERIPHERAL VASCULAR INTERVENTION  06/26/2023   Procedure: PERIPHERAL VASCULAR INTERVENTION;  Surgeon: Carlene Che, MD;  Location: [facility];  Service: Cardiovascular;;   PERIPHERAL VASCULAR THROMBECTOMY Left 06/26/2023   Procedure: PERIPHERAL VASCULAR THROMBECTOMY;  Surgeon: Carlene Che, MD;  Location: [facility];  Service: Cardiovascular;  Laterality: Left;   PERIPHERAL VASCULAR ULTRASOUND/IVUS  06/26/2023   Procedure: Peripheral Vascular Ultrasound/IVUS;  Surgeon: Carlene Che, MD;  [facility]: [facility];  Service: Cardiovascular;;   POLYPECTOMY  09/29/2023   Procedure: POLYPECTOMY, STOMACH AND COLON;  Surgeon: Felecia Hopper, MD;  Location: [facility];  Service: Gastroenterology;;    Allergies: Patient has no known allergies.  Medications: Prior to Admission medications   Medication Sig Start Date End Date Taking? Authorizing Provider  apixaban  (ELIQUIS ) 5 MG TABS tablet Take 1 tablet (5 mg total) by mouth 2 (two) times daily. 07/06/23   Heilingoetter, Cassandra L, PA-C  atorvastatin  (LIPITOR) 40 MG tablet Take 1 tablet (40 mg total) by mouth daily. Patient taking differently: Take 40  mg by mouth in the morning. 07/30/23   [name], MD  B-D ULTRAFINE III SHORT PEN 31G X 8 MM MISC Inject into the skin as directed. 06/30/23   [provider]  calcium  carbonate (TUMS EX) 750 MG chewable tablet Chew 3 tablets by mouth as needed for heartburn.    [provider]  cyanocobalamin  (VITAMIN B12) 1000 MCG/ML injection INJECT 1 ML (1,000 MCG TOTAL) INTO THE MUSCLE EVERY 30 DAYS. Patient taking differently: Inject 1,000 mcg into the muscle every 30 (thirty) days. 12/10/22   [name], MD  Dulaglutide  (TRULICITY ) 1.5 MG/0.5ML SOAJ Inject 1.5 mg into the skin every Sunday.    [provider]  enalapril  (VASOTEC ) 10 MG tablet Take 1 tablet (10 mg total) by mouth 2 (two) times daily. 10/08/23   Haydee Lipa, MD  folic acid  (FOLVITE ) 1 MG tablet TAKE 1 TABLET BY MOUTH EVERY DAY 08/26/23   Heilingoetter, Cassandra L, PA-C  glucose blood (ONETOUCH VERIO) test strip Use as instructed to check blood sugars twice daily E11.69 06/23/23   [name], MD  glucose blood test strip Use as instructed to test blood sugar 3 times a day. Dx code: e11.65 11/26/21   [name], MD  insulin  glargine (LANTUS ) 100 UNIT/ML injection Inject 0.16 mLs (16 Units total) into the skin at bedtime. Patient taking differently: Inject 19 Units into the skin at bedtime. 06/28/23   Doroteo Gasmen, MD  sildenafil  (VIAGRA ) 100 MG tablet TAKE 1 TABLET BY MOUTH EVERY DAY AS NEEDED (INSURANCE COVERS 10 TAB PER 77DAYS) Patient taking differently: Take 100 mg by mouth as needed for erectile dysfunction. 02/13/23   [name], MD  SYRINGE-NEEDLE, DISP, 3 ML (B-D INTEGRA SYRINGE) 25G X 5/8" 3 ML MISC USE AS DIRECTED 06/23/23   [name], MD     Family History  Problem Relation Age of Onset   Hypertension Mother    Multiple myeloma Mother    Hypertension Father    Diabetes Father     Social History   Socioeconomic History   Marital status: Married    Spouse name: Not on file   Number of children: Not on file    Years of education: Not on file   Highest education level: Not on file  Occupational History   Not on file  Tobacco Use   Smoking status: Never   Smokeless tobacco: Never   Tobacco comments:    n/a  Vaping Use   Vaping status: Never Used  Substance and Sexual Activity   Alcohol use: No   Drug use: No   Sexual activity: Not on file  Other Topics Concern   Not on file  Social History Narrative   Not on file   Social Drivers of Health   Financial Resource Strain: Low Risk  (11/05/2022)   Overall Financial Resource Strain (CARDIA)    Difficulty of Paying Living Expenses: Not hard at all  Food Insecurity: No Food Insecurity (10/06/2023)   Hunger Vital Sign    Worried About Running Out of Food in the Last Year: Never true    Ran Out of Food in the Last Year: Never true  Transportation Needs: No Transportation Needs (10/06/2023)   PRAPARE - Administrator, Civil Service (Medical): No    Lack of Transportation (Non-Medical): No  Physical Activity: Sufficiently Active (11/05/2022)   Exercise Vital Sign    Days of Exercise per Week: 7 days    Minutes of Exercise per Session:  60 min  Stress: No Stress Concern Present (11/05/2022)   [employer] of Occupational Health - Occupational Stress Questionnaire    Feeling of Stress : Not at all  Social Connections: Moderately Integrated (10/06/2023)   Social Connection and Isolation Panel [NHANES]    Frequency of Communication with Friends and Family: More than three times a week    Frequency of Social Gatherings with Friends and Family: More than three times a week    Attends Religious Services: More than 4 times per year    Active Member of [employer] or Organizations: No    Attends Banker Meetings: Never    Marital Status: Married       Review of Systems  Vital Signs:   Advance Care Plan: No documents on file  Physical Exam  Imaging: IR US  CHEST Result Date: 10/06/2023 CLINICAL DATA:  Pleural  effusions, assess thoracentesis. EXAM: CHEST ULTRASOUND COMPARISON:  10/05/2023 CT chest FINDINGS: Ultrasound performed of the posterior chest. No significant right effusion by ultrasound. Trace small left effusion with left lower lobe atelectasis. There is not enough effusion to warrant thoracentesis. Procedure not performed. IMPRESSION: Trace small left effusion by ultrasound. Electronically Signed   By: Melven Stable.  Shick M.D.   On: 10/06/2023 16:31   CT CHEST ABDOMEN PELVIS W CONTRAST Result Date: 10/05/2023 CLINICAL DATA:  Left lung cancer.  Fever with nausea and vomiting. * Tracking Code: BO * EXAM: CT CHEST, ABDOMEN, AND PELVIS WITH CONTRAST TECHNIQUE: Multidetector CT imaging of the chest, abdomen and pelvis was performed following the standard protocol during bolus administration of intravenous contrast. RADIATION DOSE REDUCTION: This exam was performed according to the departmental dose-optimization program which includes automated exposure control, adjustment of the mA and/or kV according to patient size and/or use of iterative reconstruction technique. CONTRAST:  75mL OMNIPAQUE  IOHEXOL  350 MG/ML SOLN COMPARISON:  08/20/2023 FINDINGS: CT CHEST FINDINGS Cardiovascular: Ascending thoracic aortic aneurysm stable at 4.0 cm diameter. Mediastinum/Nodes: Small thyroid  nodules measuring up to 1.0 cm in diameter. Not clinically significant; no follow-up imaging recommended (ref: J Am Coll Radiol. 2015 Feb;12(2): 143-50). No pathologic thoracic adenopathy.  Mild gynecomastia. Lungs/Pleura: Left upper lobectomy. Increased patchy airspace opacity scattered in the left lower lobe favoring pneumonia. New gas in the left pleural fluid collection both anteriorly and in the sub pulmonic fluid; if the patient has had recent thoracentesis then this may be incidental pneumothorax component, but otherwise the possibility of early empyema with gas-forming organism might be considered. No pneumomediastinum. Musculoskeletal: Thoracic  spondylosis. CT ABDOMEN PELVIS FINDINGS Hepatobiliary: Unremarkable Pancreas: Unremarkable Spleen: Unremarkable Adrenals/Urinary Tract: Stable 1.3 by 1.7 cm left adrenal mass shown to be an adenoma on 12/09/2022. No follow up is indicated. Mild scarring in the left mid kidney anteriorly. Stomach/Bowel: Thickened appendix mildly worsened from previous, up to 1.1 cm in diameter. Cannot exclude appendicitis or appendiceal mucocele. Trace stranding adjacent to the appendiceal tip for example on image 78 series 3. Vascular/Lymphatic: Left common iliac vein stent noted. No pathologic adenopathy in the abdomen. Reproductive: Unremarkable Other: No supplemental non-categorized findings. Musculoskeletal: Bridging spurring at the sacroiliac joints anteriorly. Lower lumbar spondylosis and degenerative disc disease. IMPRESSION: 1. Increased patchy airspace opacity scattered in the left lower lobe favoring pneumonia. 2. New gas in the left pleural fluid collection both anteriorly and in the subpulmonic fluid; if the patient has had recent thoracentesis then this may be incidental pneumothorax component, but otherwise the possibility of early empyema with gas-forming organism might be considered. No pneumomediastinum observed. 3.  Thickened appendix mildly worsened from previous, up to 1.1 cm in diameter. Cannot exclude appendicitis or appendiceal mucocele. Trace stranding adjacent to the appendiceal tip. 4. Stable ascending thoracic aortic aneurysm at 4.0 cm diameter. This can likely be followed in the context of the patient's oncology imaging. Otherwise, recommend annual imaging followup by CTA or MRA. This recommendation follows 2010 ACCF/AHA/AATS/ACR/ASA/SCA/SCAI/SIR/STS/SVM Guidelines for the Diagnosis and Management of Patients with Thoracic Aortic Disease. Circulation. 2010; 121: X914-N829. Aortic aneurysm NOS (ICD10-I71.9) 5. Stable left adrenal adenoma. 6. Mild gynecomastia. 7. Lower lumbar spondylosis and degenerative  disc disease. 8. Bridging spurring at the sacroiliac joints anteriorly. These results were called by telephone at the time of interpretation on 10/05/2023 at 10:01 pm to provider [facility] , who verbally acknowledged these results. Electronically Signed   By: Freida Jes M.D.   On: 10/05/2023 22:07   DG Chest 2 View Result Date: 10/05/2023 CLINICAL DATA:  Suspected sepsis.  Fever, nausea, vomiting EXAM: CHEST - 2 VIEW COMPARISON:  05/05/2023.  CT 08/20/2023 FINDINGS: Heart and mediastinal contours within normal limits. Right lung clear. Volume loss on the left with small loculated left pleural effusion as seen on prior CT. No definite confluent airspace opacity. Postoperative changes on the left. No acute bony abnormality. IMPRESSION: Postoperative changes on the left with volume loss and small loculated left pleural effusion. Electronically Signed   By: Janeece Mechanic M.D.   On: 10/05/2023 21:27    Labs:  CBC: Recent Labs    10/06/23 0616 10/06/23 0946 10/07/23 0304 10/07/23 2259 10/08/23 0237  WBC 10.9*  --  10.9* 10.8* 9.9  HGB 6.5* 6.9* 6.6* 8.4* 8.5*  HCT 19.7* 21.4* 19.7* 24.5* 25.4*  PLT 177  --  170 178 175    COAGS: Recent Labs    03/16/23 1328 10/05/23 1940  INR 1.1 1.6*  APTT 29  --     BMP: Recent Labs    10/05/23 1940 10/06/23 0616 10/07/23 0304 10/08/23 0237  NA 136 136 133* 134*  K 5.0 4.8 4.4 4.5  CL 107 109 106 105  CO2 20* 20* 21* 22  GLUCOSE 129* 88 76 89  BUN 20 19 16 14   CALCIUM  9.3 8.6* 8.7* 8.8*  CREATININE 1.94* 2.03* 1.53* 1.38*  GFRNONAA 37* 35* 49* 55*    LIVER FUNCTION TESTS: Recent Labs    08/31/23 1320 09/07/23 1226 10/05/23 0939 10/05/23 1940  BILITOT 0.4 0.6 0.4 0.7  AST 27 39 16 24  ALT 12 22 14 16   ALKPHOS 133* 128* 150* 109  PROT 9.5* 9.9* 9.1* 8.7*  ALBUMIN 3.5 3.8 3.8 3.2*    TUMOR MARKERS: No results for input(s): "AFPTM", "CEA", "CA199", "CHROMGRNA" in the last 8760 hours.  Assessment and Plan: 68 y.o.  male with past medical history of non-small cell lung cancer 2024 with prior right upper lobectomy, AAA, hypertension, colon polyps, diabetes, hyperlipidemia, left lower extremity DVT/PE,  anemia, hypertension, recent pneumonia/empyema, CKD and MGUS/smoldering myeloma. He now presents with worsening labs concerning for multiple myeloma and is scheduled today for CT-guided bone marrow biopsy for further evaluation.  He is known to IR team from bone marrow biopsy in 2023. Risks and benefits of procedure was discussed with the patient  including, but not limited to bleeding, infection, damage to adjacent structures or low yield requiring additional tests.  All of the questions were answered and there is agreement to proceed.  Consent signed and in chart.    Thank you for allowing our service to  participate in William Mcguire 's care.  Electronically Signed: D. Honore Lux, PA-C   10/19/2023, 3:11 PM      I spent a total of    15 Minutes in face to face in clinical consultation, greater than 50% of which was counseling/coordinating care for CT-guided bone marrow biopsy

## 2023-10-19 NOTE — Patient Instructions (Signed)
 Lung Abscess  A lung abscess is a space (cavity) that forms inside the lung and fills with dead tissue and pus. It results from an infection that gets into the lung. A lung abscess can cause symptoms such as fatigue, fever, and a cough that brings up fluid from the lungs (sputum). A lung abscess usually occurs in just one lung. A person who has a lung abscess may need treatment for several weeks. A lung abscess that lasts longer than 6 weeks is considered long-term (chronic). What are the causes? This condition is usually caused by bacteria. In most cases, it is caused by more than one type of bacteria. Bacteria can get into the lungs by: Saliva, vomit, or liquid that is contaminated with bacteria being inhaled (aspirated) into the lungs. Spreading from a blood infection (sepsis). This can sometimes cause abscesses in both lungs. What increases the risk? The following factors may make you more likely to develop this condition: Having a medical condition that weakens the body's defense system (immune system). Having a dental, gum, or sinus infection. Having poor oral hygiene. Aspiration. You may be at risk for aspiration if you: Have a nervous system condition that interferes with coughing or swallowing. Have a tube in your airway for breathing (tracheal tube or endotracheal tube). Are very drowsy or unconscious because of a medicine (sedative) or drinking too much alcohol. Chronic lung conditions, such as chronic obstructive pulmonary disease (COPD), cancer, or cystic fibrosis. Aging. What are the signs or symptoms? Early symptoms of this condition are similar to the symptoms of other lung infections, such as pneumonia. These include: Fever. Chills. Night sweats. A cough that brings up sputum. Shortness of breath. Fatigue. Symptoms of a chronic lung abscess also include: Weight loss. Chest pain. Coughing up sputum that smells bad, is discolored, or is tinged with blood. How is this  diagnosed? This condition may be diagnosed based on: A physical exam and medical history. During the exam, your health care provider will use a stethoscope to listen to your lungs and check for abnormal sounds in the abscess area. Medical tests, such as: Chest X-rays to check for a cavity that is filled with fluid and air. Blood tests to look for signs of infection. Blood and sputum cultures to test for bacteria. Blood tests to measure the oxygen level in your blood (arterial blood gases, or ABG). A procedure to examine your lung and take samples of fluid or tissue using an endoscopic tube (bronchoscope). Imaging studies of your lung, such as a CT scan or ultrasound. How is this treated? This condition may be treated with: Antibiotic medicines. This is usually the first treatment. First, these medicines may be given directly into a vein through an IV. Treatment with antibiotics may start with an antibiotic that is known to work against many different kinds of bacteria (broad-spectrum antibiotic). Second, your IV antibiotics may be changed if tests identify the germs and show that another type of antibiotic may be more effective. You may have to continue antibiotics in the hospital or at home for several weeks. Antifungal medicines. These may be given if a fungus is present in the abscess. Supportive care, such as: IV fluids and nutrition. Oxygen therapy. A procedure to drain the abscess using a bronchoscope or drainage tube. Open lung surgery to drain the abscess and remove dead lung tissue. This is rare. Follow these instructions at home: Medicines  Take over-the-counter and prescription medicines only as told by your health care provider. Take your  antibiotic medicine as told by your health care provider. Do not stop taking the antibiotic even if you start to feel better. Lifestyle Do not abuse drugs or alcohol. Do not use any products that contain nicotine or tobacco. These products  include cigarettes, chewing tobacco, and vaping devices, such as e-cigarettes. If you need help quitting, ask your health care provider. Brush your teeth every morning and night with fluoride toothpaste and floss once a day. See a dentist regularly. Eat a healthy diet. Ask your health care provider what foods are right for you. Return to your normal activities as told by your health care provider. Ask your health care provider what activities are safe for you. General instructions Drink enough fluids to keep your urine pale yellow. Do breathing exercises as told by your health care provider. This may include: Changing positions to help drain the infected area (postural drainage). Using a breathing device (incentive spirometer) to help you take very deep breaths. Keep all follow-up visits. This is important. Get help right away if: You have a fever. You have a cough that is not going away or is getting worse. You cough up blood. You have shortness of breath that is not going away or is getting worse. You have chest pain. These symptoms may be an emergency. Get help right away. Call 911. Do not wait to see if the symptoms will go away. Do not drive yourself to the hospital. Summary A lung abscess is a cavity in the lung that fills with dead tissue and pus due to an infection. Antibiotic therapy is the main treatment for a lung abscess. Do not stop taking the antibiotic even if you start to feel better. As you heal, your health care provider may recommend special lung exercises to help improve your breathing and encourage drainage of the infection from your lung. Get help right away if you develop a fever or have a cough that is not going away or is getting worse. This information is not intended to replace advice given to you by your health care provider. Make sure you discuss any questions you have with your health care provider. Document Revised: 01/23/2021 Document Reviewed:  01/23/2021 Elsevier Patient Education  2024 [employer].

## 2023-10-19 NOTE — Progress Notes (Signed)
 I,[name], CMA,acting as a Neurosurgeon for Smiley Dung, MD.,have documented all relevant documentation on the behalf of Smiley Dung, MD,as directed by  Smiley Dung, MD while in the presence of Smiley Dung, MD.  Subjective:  Patient ID: William Mcguire , male    DOB: 1956-04-02 , 68 y.o.   MRN: [id number]  Chief Complaint  Patient presents with   Hosptial Follow Up    Patient presents today for hospital follow up. Admitted on 4/14 & discharged on 4/17. For Empyema. He reports feeling okay. He does experience lack of energy/ strength.     HPI Discussed the use of AI scribe software for clinical note transcription with the patient, who gave verbal consent to proceed.  History of Present Illness William Mcguire is a 68 year old male who presents for a hospital follow-up after treatment for sepsis and empyema.  He was admitted to the hospital on April 14th and discharged on April 17th with a diagnosis of sepsis and left lower lobe pneumonia, which progressed to empyema. Initially, he had fever, chills, nausea, and vomiting. During his hospital stay, he was found to be anemic and received a blood transfusion. He was treated with antibiotics for the infection. Since discharge, his symptoms have improved.  Prior to his hospital admission, he experienced an episode at home where he was outside in approximately 80-degree weather, working in the yard and Photographer. After returning to the garage, he sat down and possibly lost consciousness, during which he aspirated, vomited, urinated, and had a bowel movement. He had not consumed any food or fluids before going outside and was outside for about 30 to 40 minutes before the incident occurred. He did not seek medical attention until later that evening.  During his hospital stay, an abdominal aortic aneurysm was discovered, which is currently stable. Diameter is 4.0 cm. His blood pressure readings at home have improved,  ranging from 110/70 to 134/70, after adjustments to his medications, including stopping amlodipine  and decreasing enalapril  to 10mg  twice daily  He has a history of non-small cell lung cancer, with the last follow-up scan in February showing no new findings. He also underwent a colonoscopy where three polyps were found, one of which was large and appeared to have been bleeding, contributing to his anemia. The polyps were cauterized, and he is awaiting further instructions on follow-up.  His blood sugar levels at home are below 200, with recent readings between 64 and 98. He has a decreased appetite but is beginning to eat more.   Past Medical History:  Diagnosis Date   Diabetes mellitus    High cholesterol    Hypertension      Family History  Problem Relation Age of Onset   Hypertension Mother    Multiple myeloma Mother    Hypertension Father    Diabetes Father      Current Outpatient Medications:    apixaban  (ELIQUIS ) 5 MG TABS tablet, Take 1 tablet (5 mg total) by mouth 2 (two) times daily., Disp: 60 tablet, Rfl: 2   atorvastatin  (LIPITOR) 40 MG tablet, Take 1 tablet (40 mg total) by mouth daily. (Patient taking differently: Take 40 mg by mouth in the morning.), Disp: 90 tablet, Rfl: 1   B-D ULTRAFINE III SHORT PEN 31G X 8 MM MISC, Inject into the skin as directed., Disp: , Rfl:    calcium  carbonate (TUMS EX) 750 MG chewable tablet, Chew 3 tablets by mouth as needed for heartburn.,  Disp: , Rfl:    cyanocobalamin  (VITAMIN B12) 1000 MCG/ML injection, INJECT 1 ML (1,000 MCG TOTAL) INTO THE MUSCLE EVERY 30 DAYS. (Patient taking differently: Inject 1,000 mcg into the muscle every 30 (thirty) days.), Disp: 9 mL, Rfl: 1   Dulaglutide  (TRULICITY ) 1.5 MG/0.5ML SOAJ, Inject 1.5 mg into the skin every Sunday., Disp: , Rfl:    enalapril  (VASOTEC ) 10 MG tablet, Take 1 tablet (10 mg total) by mouth 2 (two) times daily., Disp: 60 tablet, Rfl: 0   folic acid  (FOLVITE ) 1 MG tablet, TAKE 1 TABLET BY  MOUTH EVERY DAY, Disp: 90 tablet, Rfl: 1   glucose blood (ONETOUCH VERIO) test strip, Use as instructed to check blood sugars twice daily E11.69, Disp: 100 each, Rfl: 2   glucose blood test strip, Use as instructed to test blood sugar 3 times a day. Dx code: e11.65, Disp: 100 each, Rfl: 2   insulin  glargine (LANTUS ) 100 UNIT/ML injection, Inject 0.16 mLs (16 Units total) into the skin at bedtime. (Patient taking differently: Inject 19 Units into the skin at bedtime.), Disp: 10 mL, Rfl: 11   sildenafil  (VIAGRA ) 100 MG tablet, TAKE 1 TABLET BY MOUTH EVERY DAY AS NEEDED (INSURANCE COVERS 10 TAB PER 77DAYS) (Patient taking differently: Take 100 mg by mouth as needed for erectile dysfunction.), Disp: 10 tablet, Rfl: 5   SYRINGE-NEEDLE, DISP, 3 ML (B-D INTEGRA SYRINGE) 25G X 5/8" 3 ML MISC, USE AS DIRECTED, Disp: 12 each, Rfl: 24   No Known Allergies   Review of Systems  Constitutional: Negative.   HENT: Negative.    Respiratory:  Positive for shortness of breath.   Cardiovascular: Negative.   Gastrointestinal: Negative.   Skin: Negative.   Allergic/Immunologic: Negative.   Neurological: Negative.   Hematological: Negative.      Today's Vitals   10/19/23 1216  BP: 132/80  Pulse: 69  Temp: 97.9 F (36.6 C)  SpO2: 98%  Weight: 227 lb 9.6 oz (103.2 kg)  Height: 6\' 3"  (1.905 m)   Body mass index is 28.45 kg/m.  Wt Readings from Last 3 Encounters:  10/20/23 227 lb 9.6 oz (103.2 kg)  10/19/23 227 lb 9.6 oz (103.2 kg)  10/06/23 229 lb 8 oz (104.1 kg)     Objective:  Physical Exam Vitals and nursing note reviewed.  Constitutional:      Appearance: Normal appearance.  HENT:     Head: Normocephalic and atraumatic.  Eyes:     Extraocular Movements: Extraocular movements intact.  Cardiovascular:     Rate and Rhythm: Normal rate and regular rhythm.     Heart sounds: Normal heart sounds.  Pulmonary:     Effort: Pulmonary effort is normal.     Breath sounds: Normal breath sounds.   Musculoskeletal:     Cervical back: Normal range of motion.  Skin:    General: Skin is warm.  Neurological:     General: No focal deficit present.     Mental Status: He is alert.  Psychiatric:        Mood and Affect: Mood normal.      Assessment And Plan:  Empyema (HCC) Assessment & Plan: TCM PERFORMED. A MEMBER OF THE CLINICAL TEAM SPOKE WITH THE PATIENT UPON DISCHARGE. DISCHARGE SUMMARY WAS REVIEWED IN FULL DETAIL DURING THE VISIT. MEDS RECONCILED AND COMPARED TO DISCHARGE MEDS. MEDICATION LIST WAS UPDATED AND REVIEWED WITH THE PATIENT. GREATER THAN 50% FACE TO FACE TIME WAS SPENT IN COUNSELING AND COORDINATION OF CARE. ALL QUESTIONS WERE ANSWERED TO THE  SATISFACTION OF THE PATIENT.     Severe sepsis [facility]) Assessment & Plan: Likely secondary to left lower lobe pneumonia/presumed aspiration, now resolved . - Patient was not hypoxic during hospitalization -Atypical findings likely in the setting of recent instrumentation and likely aspiration event at home -PCCM/IR initially consulted for possible thoracentesis or chest tube placement but given minimal fluid and no obvious empyema or free air decision was made to hold off on instrumentation.   AKI (acute kidney injury) [facility]) Assessment & Plan: Likely prerenal from dehydration.  Creatinine 1.9, baseline 1.3-1.5. - Resolved with increased p.o. intake and IV fluids, resume enalapril  at lower dose - Encouraged to stay well hydrated   Hypertensive nephropathy Assessment & Plan: Chronic, fair control. Goal BP<120/80.  He is now on enalapril  10mg  twice daily. Amlodipine  is held due to soft bp readings during hospitalization. He will monitor home BP readings. Will resume amlodipine  if BP is consistently above 130/80.      Mucocele of appendix Assessment & Plan: No clinical signs or symptoms of appendicitis, noted incidentally on imaging -no indication for intervention    Type 2 diabetes mellitus with stage 2 chronic kidney  disease, with long-term current use of insulin  (HCC) Assessment & Plan: Chronic, stable. We will check A1c at his physical scheduled for May 2025. No med changes today.   Orders: -     Microalbumin / creatinine urine ratio  Iron deficiency anemia, unspecified iron deficiency anemia type Assessment & Plan: Likely acute hemodilutional anemia on chronic anemia of chronic disease(CKD/lung cancer) -Now back to baseline after transfusion   Adenocarcinoma of left lung [facility]) Assessment & Plan: He is s/p lobectomy.  He is currently undergoing adjuvant chemotherapy with carboplatin  for an AUC of 5 and Alimta. No new findings on February scan. Follow-up scans every three months. - Continue with scheduled scans every three months.   Abdominal aortic aneurysm (AAA) 3.0 cm to 5.5 cm in diameter in male [facility]) Assessment & Plan: Aneurysm well-managed. Blood pressure control crucial to reduce growth risk. - Perform annual CT scan to monitor aneurysm. - Ensure blood pressure remains controlled.   Personal history of other colon polyps Assessment & Plan: Three polyps found, one large with bleeding signs. Cauterized. Awaiting gastroenterologist's follow-up instructions. - Await letter from gastroenterologist regarding timing of repeat colonoscopy.    Return if symptoms worsen or fail to improve.  Patient was given opportunity to ask questions. Patient verbalized understanding of the plan and was able to repeat key elements of the plan. All questions were answered to their satisfaction.    I, Smiley Dung, MD, have reviewed all documentation for this visit. The documentation on 10/19/23 for the exam, diagnosis, procedures, and orders are all accurate and complete.   IF YOU HAVE BEEN REFERRED TO A SPECIALIST, IT MAY TAKE 1-2 WEEKS TO SCHEDULE/PROCESS THE REFERRAL. IF YOU HAVE NOT HEARD FROM US /SPECIALIST IN TWO WEEKS, PLEASE GIVE US  A CALL AT [phone].   THE PATIENT IS ENCOURAGED TO  PRACTICE SOCIAL DISTANCING DUE TO THE COVID-19 PANDEMIC.

## 2023-10-20 ENCOUNTER — Encounter (HOSPITAL_COMMUNITY): Payer: Self-pay

## 2023-10-20 ENCOUNTER — Other Ambulatory Visit: Payer: Self-pay

## 2023-10-20 ENCOUNTER — Ambulatory Visit (HOSPITAL_COMMUNITY)
Admission: RE | Admit: 2023-10-20 | Discharge: 2023-10-20 | Disposition: A | Discharge status: Home / Self Care | Source: Ambulatory Visit | Attending: Physician Assistant | Admitting: Physician Assistant

## 2023-10-20 ENCOUNTER — Ambulatory Visit (HOSPITAL_COMMUNITY)
Admission: RE | Admit: 2023-10-20 | Discharge: 2023-10-20 | Disposition: A | Discharge status: Home / Self Care | Source: Ambulatory Visit | Attending: Physician Assistant

## 2023-10-20 DIAGNOSIS — N189 Chronic kidney disease, unspecified: Secondary | ICD-10-CM | POA: Diagnosis not present

## 2023-10-20 DIAGNOSIS — Z794 Long term (current) use of insulin: Secondary | ICD-10-CM | POA: Insufficient documentation

## 2023-10-20 DIAGNOSIS — I714 Abdominal aortic aneurysm, without rupture, unspecified: Secondary | ICD-10-CM | POA: Insufficient documentation

## 2023-10-20 DIAGNOSIS — C9000 Multiple myeloma not having achieved remission: Secondary | ICD-10-CM | POA: Diagnosis not present

## 2023-10-20 DIAGNOSIS — D472 Monoclonal gammopathy: Secondary | ICD-10-CM

## 2023-10-20 DIAGNOSIS — Z7901 Long term (current) use of anticoagulants: Secondary | ICD-10-CM | POA: Insufficient documentation

## 2023-10-20 DIAGNOSIS — Z85118 Personal history of other malignant neoplasm of bronchus and lung: Secondary | ICD-10-CM | POA: Diagnosis not present

## 2023-10-20 DIAGNOSIS — Z860100 Personal history of colon polyps, unspecified: Secondary | ICD-10-CM | POA: Insufficient documentation

## 2023-10-20 DIAGNOSIS — D631 Anemia in chronic kidney disease: Secondary | ICD-10-CM | POA: Diagnosis not present

## 2023-10-20 DIAGNOSIS — Z86718 Personal history of other venous thrombosis and embolism: Secondary | ICD-10-CM | POA: Insufficient documentation

## 2023-10-20 DIAGNOSIS — I129 Hypertensive chronic kidney disease with stage 1 through stage 4 chronic kidney disease, or unspecified chronic kidney disease: Secondary | ICD-10-CM | POA: Diagnosis not present

## 2023-10-20 DIAGNOSIS — Z1379 Encounter for other screening for genetic and chromosomal anomalies: Secondary | ICD-10-CM | POA: Diagnosis not present

## 2023-10-20 DIAGNOSIS — Z7985 Long-term (current) use of injectable non-insulin antidiabetic drugs: Secondary | ICD-10-CM | POA: Insufficient documentation

## 2023-10-20 DIAGNOSIS — E1122 Type 2 diabetes mellitus with diabetic chronic kidney disease: Secondary | ICD-10-CM | POA: Diagnosis not present

## 2023-10-20 DIAGNOSIS — E785 Hyperlipidemia, unspecified: Secondary | ICD-10-CM | POA: Diagnosis not present

## 2023-10-20 DIAGNOSIS — Z79899 Other long term (current) drug therapy: Secondary | ICD-10-CM | POA: Insufficient documentation

## 2023-10-20 DIAGNOSIS — Z86711 Personal history of pulmonary embolism: Secondary | ICD-10-CM | POA: Insufficient documentation

## 2023-10-20 DIAGNOSIS — D6489 Other specified anemias: Secondary | ICD-10-CM | POA: Diagnosis not present

## 2023-10-20 LAB — CBC WITH DIFFERENTIAL/PLATELET
Abs Immature Granulocytes: 0.04 K/uL (ref 0.00–0.07)
Basophils Absolute: 0 K/uL (ref 0.0–0.1)
Basophils Relative: 0 %
Eosinophils Absolute: 0.1 K/uL (ref 0.0–0.5)
Eosinophils Relative: 2 %
HCT: 28.7 % — ABNORMAL LOW (ref 39.0–52.0)
Hemoglobin: 9.1 g/dL — ABNORMAL LOW (ref 13.0–17.0)
Immature Granulocytes: 1 %
Lymphocytes Relative: 19 %
Lymphs Abs: 1.2 K/uL (ref 0.7–4.0)
MCH: 30.1 pg (ref 26.0–34.0)
MCHC: 31.7 g/dL (ref 30.0–36.0)
MCV: 95 fL (ref 80.0–100.0)
Monocytes Absolute: 0.8 K/uL (ref 0.1–1.0)
Monocytes Relative: 13 %
Neutro Abs: 4.2 K/uL (ref 1.7–7.7)
Neutrophils Relative %: 65 %
Platelets: 257 K/uL (ref 150–400)
RBC: 3.02 MIL/uL — ABNORMAL LOW (ref 4.22–5.81)
RDW: 13.7 % (ref 11.5–15.5)
WBC: 6.5 K/uL (ref 4.0–10.5)
nRBC: 0 % (ref 0.0–0.2)

## 2023-10-20 LAB — MICROALBUMIN / CREATININE URINE RATIO
Creatinine, Urine: 99.5 mg/dL
Microalb/Creat Ratio: 11 mg/g{creat} (ref 0–29)
Microalbumin, Urine: 11.3 ug/mL

## 2023-10-20 LAB — GLUCOSE, CAPILLARY
Glucose-Capillary: 63 mg/dL — ABNORMAL LOW (ref 70–99)
Glucose-Capillary: 80 mg/dL (ref 70–99)

## 2023-10-20 MED ORDER — SODIUM CHLORIDE 0.9 % IV SOLN
INTRAVENOUS | Status: DC
Start: 2023-10-20 — End: 2023-10-21

## 2023-10-20 MED ORDER — LIDOCAINE HCL 1 % IJ SOLN
INTRAMUSCULAR | Status: AC | PRN
  Administered 2023-10-20: 10 mL via INTRADERMAL

## 2023-10-20 MED ORDER — MIDAZOLAM HCL 2 MG/2ML IJ SOLN
INTRAMUSCULAR | Status: AC | PRN
  Administered 2023-10-20: 1 mg via INTRAVENOUS

## 2023-10-20 MED ORDER — NALOXONE HCL 0.4 MG/ML IJ SOLN
INTRAMUSCULAR | Status: AC
  Filled 2023-10-20: qty 1

## 2023-10-20 MED ORDER — FENTANYL CITRATE (PF) 100 MCG/2ML IJ SOLN
INTRAMUSCULAR | Status: AC | PRN
  Administered 2023-10-20: 50 ug via INTRAVENOUS

## 2023-10-20 MED ORDER — DEXTROSE 50 % IV SOLN
12.5000 g | Freq: Once | INTRAVENOUS | Status: AC
  Administered 2023-10-20: 12.5 g via INTRAVENOUS
  Filled 2023-10-20: qty 50

## 2023-10-20 MED ORDER — FENTANYL CITRATE (PF) 100 MCG/2ML IJ SOLN
INTRAMUSCULAR | Status: AC
  Filled 2023-10-20: qty 4

## 2023-10-20 MED ORDER — MIDAZOLAM HCL 2 MG/2ML IJ SOLN
INTRAMUSCULAR | Status: AC
  Filled 2023-10-20: qty 4

## 2023-10-20 MED ORDER — FLUMAZENIL 0.5 MG/5ML IV SOLN
INTRAVENOUS | Status: AC
  Filled 2023-10-20: qty 5

## 2023-10-20 NOTE — Discharge Instructions (Signed)
 Please call Interventional Radiology clinic [phone] with any questions or concerns.  You may remove your dressing and shower tomorrow.   Bone Marrow Aspiration and Bone Marrow Biopsy, Adult, Care After This sheet gives you information about how to care for yourself after your procedure. Your health care provider may also give you more specific instructions. If you have problems or questions, contact your health care provider. What can I expect after the procedure? After the procedure, it is common to have: Mild pain and tenderness. Swelling. Bruising. Follow these instructions at home: Puncture site care Follow instructions from your health care provider about how to take care of the puncture site. Make sure you: Wash your hands with soap and water before and after you change your bandage (dressing). If soap and water are not available, use hand sanitizer. Change your dressing as told by your health care provider. Check your puncture site every day for signs of infection. Check for: More redness, swelling, or pain. Fluid or blood. Warmth. Pus or a bad smell.   Activity Return to your normal activities as told by your health care provider. Ask your health care provider what activities are safe for you. Do not lift anything that is heavier than 10 lb (4.5 kg), or the limit that you are told, until your health care provider says that it is safe. Do not drive for 24 hours if you were given a sedative during your procedure. General instructions Take over-the-counter and prescription medicines only as told by your health care provider. Do not take baths, swim, or use a hot tub until your health care provider approves. Ask your health care provider if you may take showers. You may only be allowed to take sponge baths. If directed, put ice on the affected area. To do this: Put ice in a plastic bag. Place a towel between your skin and the bag. Leave the ice on for 20 minutes, 2-3 times a  day. Keep all follow-up visits as told by your health care provider. This is important.   Contact a health care provider if: Your pain is not controlled with medicine. You have a fever. You have more redness, swelling, or pain around the puncture site. You have fluid or blood coming from the puncture site. Your puncture site feels warm to the touch. You have pus or a bad smell coming from the puncture site. Summary After the procedure, it is common to have mild pain, tenderness, swelling, and bruising. Follow instructions from your health care provider about how to take care of the puncture site and what activities are safe for you. Take over-the-counter and prescription medicines only as told by your health care provider. Contact a health care provider if you have any signs of infection, such as fluid or blood coming from the puncture site. This information is not intended to replace advice given to you by your health care provider. Make sure you discuss any questions you have with your health care provider. Document Revised: 10/26/2018 Document Reviewed: 10/26/2018 Elsevier Patient Education  2021 [employer]   Moderate Conscious Sedation, Adult, Care After This sheet gives you information about how to care for yourself after your procedure. Your health care provider may also give you more specific instructions. If you have problems or questions, contact your health care provider. What can I expect after the procedure? After the procedure, it is common to have: Sleepiness for several hours. Impaired judgment for several hours. Difficulty with balance. Vomiting if you eat too  soon. Follow these instructions at home: For the time period you were told by your health care provider: Rest. Do not participate in activities where you could fall or become injured. Do not drive or use machinery. Do not drink alcohol. Do not take sleeping pills or medicines that cause drowsiness. Do not  make important decisions or sign legal documents. Do not take care of children on your own.      Eating and drinking Follow the diet recommended by your health care provider. Drink enough fluid to keep your urine pale yellow. If you vomit: Drink water, juice, or soup when you can drink without vomiting. Make sure you have little or no nausea before eating solid foods.   General instructions Take over-the-counter and prescription medicines only as told by your health care provider. Have a responsible adult stay with you for the time you are told. It is important to have someone help care for you until you are awake and alert. Do not smoke. Keep all follow-up visits as told by your health care provider. This is important. Contact a health care provider if: You are still sleepy or having trouble with balance after 24 hours. You feel light-headed. You keep feeling nauseous or you keep vomiting. You develop a rash. You have a fever. You have redness or swelling around the IV site. Get help right away if: You have trouble breathing. You have new-onset confusion at home. Summary After the procedure, it is common to feel sleepy, have impaired judgment, or feel nauseous if you eat too soon. Rest after you get home. Know the things you should not do after the procedure. Follow the diet recommended by your health care provider and drink enough fluid to keep your urine pale yellow. Get help right away if you have trouble breathing or new-onset confusion at home. This information is not intended to replace advice given to you by your health care provider. Make sure you discuss any questions you have with your health care provider. Document Revised: 10/07/2019 Document Reviewed: 05/05/2019 Elsevier Patient Education  2021 [employer].

## 2023-10-20 NOTE — Procedures (Signed)
 Vascular and Interventional Radiology Procedure Note  Patient: William Mcguire DOB: 06-Aug-1955 Medical Record Number: [id number] Note Date/Time: 10/20/23 9:32 AM   Performing Physician: Art Largo, MD Assistant(s): None  Diagnosis: Hx MGUS   Procedure: BONE MARROW ASPIRATION and BIOPSY  Anesthesia: Conscious Sedation Complications: None Estimated Blood Loss: Minimal Specimens: Sent for Pathology  Findings:  Successful CT-guided bone marrow aspiration and biopsy A total of 1 cores were obtained. Hemostasis of the tract was achieved using Manual Pressure.  Plan: Bed rest for 1 hours.  See detailed procedure note with images in PACS. The patient tolerated the procedure well without incident or complication and was returned to Recovery in stable condition.    Art Largo, MD [facility]   Pager. [phone] Clinic. [phone]

## 2023-10-22 ENCOUNTER — Encounter: Payer: Self-pay | Admitting: Internal Medicine

## 2023-10-22 LAB — SURGICAL PATHOLOGY

## 2023-10-25 DIAGNOSIS — I714 Abdominal aortic aneurysm, without rupture, unspecified: Secondary | ICD-10-CM | POA: Insufficient documentation

## 2023-10-25 DIAGNOSIS — K388 Other specified diseases of appendix: Secondary | ICD-10-CM | POA: Insufficient documentation

## 2023-10-25 NOTE — Assessment & Plan Note (Signed)
 Three polyps found, one large with bleeding signs. Cauterized. Awaiting gastroenterologist's follow-up instructions. - Await letter from gastroenterologist regarding timing of repeat colonoscopy.

## 2023-10-25 NOTE — Assessment & Plan Note (Signed)
 No clinical signs or symptoms of appendicitis, noted incidentally on imaging -no indication for intervention

## 2023-10-25 NOTE — Assessment & Plan Note (Signed)
 Chronic, fair control. Goal BP<120/80.  He is now on enalapril  10mg  twice daily. Amlodipine  is held due to soft bp readings during hospitalization. He will monitor home BP readings. Will resume amlodipine  if BP is consistently above 130/80.

## 2023-10-25 NOTE — Progress Notes (Unsigned)
 [facility] OFFICE PROGRESS NOTE  William Curling, MD [address] [place] [address]  DIAGNOSIS:  1) Stage IIA (T2b, N0, M0) non-small cell lung cancer, adenocarcinoma. He presented with a left upper lobe lesion. He was diagnosed in 2024.  2) left lower extremity deep venous thrombosis as well as pulmonary emboli diagnosed in January 2025 3) Plasma Cell disorder/myeloma. Plasma cells 34% on BM bx in April 2025   Molecular studies  no actionable mutations. Has KRAS G12V which is not actionable   PDL1: 6%  PRIOR THERAPY: 1) status post right upper lobectomy with lymph node sampling by Dr. Deloise Ferries on March 18, 2023.  2) vascular mechanical thrombectomy of the inferior vena cava, left common iliac vein, left external iliac vein, left common femoral vein, left femoral vein deep venous thrombosis with left common iliac vein angioplasty stenting under the care of Dr. Edgardo Goodwill on June 26, 2023. 3) Adjuvant treatment with carboplatin  and Alimta for 4 cycles. He is not a good candidate for cisplatin due to his CKD. He is status post 4 cycles. Last dose on 07/07/23  CURRENT THERAPY: Observation  INTERVAL HISTORY: William Mcguire 68 y.o. male returns to the clinic today for a follow up visit. The patient was last seen by Dr. [name] and myself on 09/08/23.   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 He was also diagnosed with stage II lung cancer in 2024 for which he underwent lobectomy in September 2024 and completed 4 cycles of adjuvant chemotherapy with Alimta and carboplatin . His last dose was in January 2025.   The patient has baseline anemia.  His hemoglobin typically hovers in the 8 and 9 range.  The patient was supposed to have a colonoscopy a couple months ago but had to be canceled due to hospitalization for history of DVT and PE.  They would like him to be on anticoagulation for a few months prior to rescheduling  his colonoscopy. He did have this repeated recently and three polyps were found, one of which was large and appeared to have been bleeding, contributing to his anemia. This was cauterized.   He was also hospitalized in the interval for pneumonia and sepsis. He is feeling better at this time.   At his last appointment, he had repeat myeloma labs which looked concerning for progression. Therefore, he had a bone marrow biopsy and aspirate performed recently which showed plasma cells of 34%.   They expressed today should he need treatment for this plasmacytosis, that they would like to continue to follow with Dr. Marton Sleeper.   He underwent a scan of the chest, abdomen, and pelvis during his hospital stay, which showed infection, likely aspiration pneumonia. He had a previous scan at the end of February. No new bone pain or history of bone fractures.  He denies any fever, chills, or night sweats.  He had been losing a lot of weight recently and his appetite was terrible but it is slowly getting better and he is gaining some weight back.  He denies any nasal congestion, sore throat, cough, or dyspnea on exertion.  He denies any nausea or vomiting.  He sometimes has early satiety.  He is here today for evaluation and to review the results of his bone marrow biopsy and aspirate.    MEDICAL HISTORY: Past Medical History:  Diagnosis Date   Diabetes mellitus    High cholesterol    Hypertension  ALLERGIES:  has no known allergies.  MEDICATIONS:  Current Outpatient Medications  Medication Sig Dispense Refill   apixaban  (ELIQUIS ) 5 MG TABS tablet Take 1 tablet (5 mg total) by mouth 2 (two) times daily. 60 tablet 2   atorvastatin  (LIPITOR) 40 MG tablet Take 1 tablet (40 mg total) by mouth daily. (Patient taking differently: Take 40 mg by mouth in the morning.) 90 tablet 1   B-D ULTRAFINE III SHORT PEN 31G X 8 MM MISC Inject into the skin as directed.     calcium  carbonate (TUMS EX) 750 MG chewable  tablet Chew 3 tablets by mouth as needed for heartburn.     cyanocobalamin  (VITAMIN B12) 1000 MCG/ML injection INJECT 1 ML (1,000 MCG TOTAL) INTO THE MUSCLE EVERY 30 DAYS. (Patient taking differently: Inject 1,000 mcg into the muscle every 30 (thirty) days.) 9 mL 1   Dulaglutide  (TRULICITY ) 1.5 MG/0.5ML SOAJ Inject 1.5 mg into the skin every Sunday.     enalapril  (VASOTEC ) 10 MG tablet Take 1 tablet (10 mg total) by mouth 2 (two) times daily. 60 tablet 0   folic acid  (FOLVITE ) 1 MG tablet TAKE 1 TABLET BY MOUTH EVERY DAY 90 tablet 1   glucose blood (ONETOUCH VERIO) test strip Use as instructed to check blood sugars twice daily E11.69 100 each 2   glucose blood test strip Use as instructed to test blood sugar 3 times a day. Dx code: e11.65 100 each 2   insulin  glargine (LANTUS ) 100 UNIT/ML injection Inject 0.16 mLs (16 Units total) into the skin at bedtime. (Patient taking differently: Inject 19 Units into the skin at bedtime.) 10 mL 11   sildenafil  (VIAGRA ) 100 MG tablet TAKE 1 TABLET BY MOUTH EVERY DAY AS NEEDED (INSURANCE COVERS 10 TAB PER 77DAYS) (Patient taking differently: Take 100 mg by mouth as needed for erectile dysfunction.) 10 tablet 5   SYRINGE-NEEDLE, DISP, 3 ML (B-D INTEGRA SYRINGE) 25G X 5/8" 3 ML MISC USE AS DIRECTED 12 each 24   No current facility-administered medications for this visit.    SURGICAL HISTORY:  Past Surgical History:  Procedure Laterality Date   BIOPSY OF SKIN SUBCUTANEOUS TISSUE AND/OR MUCOUS MEMBRANE  09/29/2023   Procedure: BIOPSY, SKIN, SUBCUTANEOUS TISSUE, OR MUCOUS MEMBRANE;  Surgeon: Felecia Hopper, MD;  Location: [facility];  Service: Gastroenterology;;   BRONCHIAL BIOPSY  01/05/2023   Procedure: BRONCHIAL BIOPSIES;  Surgeon: Prudy Brownie, DO;  Location: [facility];  Service: Pulmonary;;   BRONCHIAL BRUSHINGS  01/05/2023   Procedure: BRONCHIAL BRUSHINGS;  Surgeon: Prudy Brownie, DO;  Location: [facility];  Service: Pulmonary;;   BRONCHIAL  NEEDLE ASPIRATION BIOPSY  01/05/2023   Procedure: BRONCHIAL NEEDLE ASPIRATION BIOPSIES;  Surgeon: Prudy Brownie, DO;  Location: [facility];  Service: Pulmonary;;   COLONOSCOPY N/A 09/29/2023   Procedure: COLONOSCOPY;  Surgeon: Felecia Hopper, MD;  Location: [facility];  Service: Gastroenterology;  Laterality: N/A;   ESOPHAGOGASTRODUODENOSCOPY N/A 09/29/2023   Procedure: EGD (ESOPHAGOGASTRODUODENOSCOPY);  Surgeon: Felecia Hopper, MD;  Location: [facility];  Service: Gastroenterology;  Laterality: N/A;   INTERCOSTAL NERVE BLOCK Left 03/18/2023   Procedure: INTERCOSTAL NERVE BLOCK;  Surgeon: Hilarie Lovely, MD;  Location: [facility];  Service: Thoracic;  Laterality: Left;   KNEE SURGERY Left 2004   torn cartilage   LYMPH NODE BIOPSY Left 03/18/2023   Procedure: LYMPH NODE BIOPSY;  Surgeon: Hilarie Lovely, MD;  Location: [facility];  Service: Thoracic;  Laterality: Left;   PATELLAR TENDON REPAIR  06/11/2011  Procedure: PATELLA TENDON REPAIR;  Surgeon: Jasmine Mesi;  Location: [facility];  Service: Orthopedics;  Laterality: Right;   PERIPHERAL VASCULAR INTERVENTION  06/26/2023   Procedure: PERIPHERAL VASCULAR INTERVENTION;  Surgeon: Carlene Che, MD;  Location: [facility];  Service: Cardiovascular;;   PERIPHERAL VASCULAR THROMBECTOMY Left 06/26/2023   Procedure: PERIPHERAL VASCULAR THROMBECTOMY;  Surgeon: Carlene Che, MD;  Location: [facility];  Service: Cardiovascular;  Laterality: Left;   PERIPHERAL VASCULAR ULTRASOUND/IVUS  06/26/2023   Procedure: Peripheral Vascular Ultrasound/IVUS;  Surgeon: Carlene Che, MD;  [facility];  Service: Cardiovascular;;   POLYPECTOMY  09/29/2023   Procedure: POLYPECTOMY, STOMACH AND COLON;  Surgeon: Felecia Hopper, MD;  Location: [facility];  Service: Gastroenterology;;    REVIEW OF SYSTEMS:   Review of Systems  Constitutional: Positive for fatigue and weight loss/decreased appetite (improving).  Negative  for chills. He has not had a fever since last week.  HENT: Negative for mouth sores, nosebleeds, sore throat and trouble swallowing.   Eyes: Negative for eye problems and icterus.  Respiratory: Negative for cough, hemoptysis, shortness of breath and wheezing.   Cardiovascular: Negative for chest pain and leg swelling.  Gastrointestinal: Negative for abdominal pain, diarrhea, nausea and vomiting.  Genitourinary: Negative for bladder incontinence, difficulty urinating, dysuria, frequency and hematuria.   Musculoskeletal: Negative for back pain, gait problem, neck pain and neck stiffness.  Skin: Negative for itching and rash.  Neurological: Negative for dizziness, extremity weakness, gait problem, headaches, light-headedness and seizures.  Hematological: Negative for adenopathy. Does not bruise/bleed easily.  Psychiatric/Behavioral: Negative for confusion, depression and sleep disturbance. The patient is not nervous/anxious.     PHYSICAL EXAMINATION:  Blood pressure 138/72, pulse 69, temperature (!) 97.2 F (36.2 C), temperature source Temporal, resp. rate 13, weight 223 lb 14.4 oz (101.6 kg), SpO2 100%.  ECOG PERFORMANCE STATUS: 1  Physical Exam  Constitutional: Oriented to person, place, and time and well-developed, well-nourished, and in no distress.  HENT:  Head: Normocephalic and atraumatic.  Mouth/Throat: Oropharynx is clear and moist. No oropharyngeal exudate.  Eyes: Conjunctivae are normal. Right eye exhibits no discharge. Left eye exhibits no discharge. No scleral icterus.  Neck: Normal range of motion. Neck supple.  Cardiovascular: Normal rate, regular rhythm, normal heart sounds and intact distal pulses.   Pulmonary/Chest: Effort normal and breath sounds normal. No respiratory distress. No wheezes. No rales.  Abdominal: Soft. Bowel sounds are normal. Exhibits no distension and no mass. There is no tenderness.  Musculoskeletal: Normal range of motion. Exhibits no edema.   Lymphadenopathy:    No cervical adenopathy.  Neurological: Alert and oriented to person, place, and time. Exhibits normal muscle tone. Gait normal. Coordination normal.  Skin: Skin is warm and dry. No rash noted. Not diaphoretic. No erythema. No pallor.  Psychiatric: Mood, memory and judgment normal.  Vitals reviewed.  LABORATORY DATA: Lab Results  Component Value Date   WBC 6.0 10/26/2023   HGB 9.3 (L) 10/26/2023   HCT 27.9 (L) 10/26/2023   MCV 90.3 10/26/2023   PLT 226 10/26/2023      Chemistry      Component Value Date/Time   NA 133 (L) 10/26/2023 0944   NA 137 11/05/2022 1152   K 4.2 10/26/2023 0944   CL 103 10/26/2023 0944   CO2 26 10/26/2023 0944   BUN 15 10/26/2023 0944   BUN 11 11/05/2022 1152   CREATININE 1.44 (H) 10/26/2023 0944      Component Value  Date/Time   CALCIUM  9.4 10/26/2023 0944   ALKPHOS 134 (H) 10/26/2023 0944   AST 18 10/26/2023 0944   ALT 12 10/26/2023 0944   BILITOT 0.4 10/26/2023 0944       RADIOGRAPHIC STUDIES:  CT BONE MARROW BIOPSY & ASPIRATION Result Date: 10/20/2023 INDICATION: hx MGUS and smoldering multiple myeloma. Worsening labs concerning for now meeting criterial for multiple myeloma EXAM: CT GUIDED BONE MARROW ASPIRATION AND CORE BIOPSY MEDICATIONS: None. ANESTHESIA/SEDATION: Moderate (conscious) sedation was employed during this procedure. A total of Versed  2 mg and Fentanyl  100 mcg was administered intravenously. Moderate Sedation Time: 11 minutes. The patient's level of consciousness and vital signs were monitored continuously by radiology nursing throughout the procedure under my direct supervision. FLUOROSCOPY TIME:  CT dose; 189 mGycm COMPLICATIONS: None immediate. Estimated blood loss: <5 mL PROCEDURE: RADIATION DOSE REDUCTION: This exam was performed according to the departmental dose-optimization program which includes automated exposure control, adjustment of the mA and/or kV according to patient size and/or use of iterative  reconstruction technique. Informed written consent was obtained from the patient after a thorough discussion of the procedural risks, benefits and alternatives. All questions were addressed. Maximal Sterile Barrier Technique was utilized including caps, mask, sterile gowns, sterile gloves, sterile drape, hand hygiene and skin antiseptic. A timeout was performed prior to the initiation of the procedure. The patient was positioned prone and non-contrast localization CT was performed of the pelvis to demonstrate the iliac marrow spaces. Under sterile conditions and local anesthesia, an 11 gauge coaxial bone biopsy needle was advanced into the RIGHT iliac marrow space. Needle position was confirmed with CT imaging. Initially, bone marrow aspiration was performed. Next, the 11 gauge outer cannula was utilized to obtain a 1 iliac bone marrow core biopsy. Needle was removed. Hemostasis was obtained with compression. The patient tolerated the procedure well. Samples were prepared with the cytotechnologist. IMPRESSION: Successful CT-guided bone marrow aspiration and biopsy. Art Largo, MD [facility] Electronically Signed   By: Art Largo M.D.   On: 10/20/2023 12:21   IR US  CHEST Result Date: 10/06/2023 CLINICAL DATA:  Pleural effusions, assess thoracentesis. EXAM: CHEST ULTRASOUND COMPARISON:  10/05/2023 CT chest FINDINGS: Ultrasound performed of the posterior chest. No significant right effusion by ultrasound. Trace small left effusion with left lower lobe atelectasis. There is not enough effusion to warrant thoracentesis. Procedure not performed. IMPRESSION: Trace small left effusion by ultrasound. Electronically Signed   By: Melven Stable.  Shick M.D.   On: 10/06/2023 16:31   CT CHEST ABDOMEN PELVIS W CONTRAST Result Date: 10/05/2023 CLINICAL DATA:  Left lung cancer.  Fever with nausea and vomiting. * Tracking Code: BO * EXAM: CT CHEST, ABDOMEN, AND PELVIS WITH  CONTRAST TECHNIQUE: Multidetector CT imaging of the chest, abdomen and pelvis was performed following the standard protocol during bolus administration of intravenous contrast. RADIATION DOSE REDUCTION: This exam was performed according to the departmental dose-optimization program which includes automated exposure control, adjustment of the mA and/or kV according to patient size and/or use of iterative reconstruction technique. CONTRAST:  75mL OMNIPAQUE  IOHEXOL  350 MG/ML SOLN COMPARISON:  08/20/2023 FINDINGS: CT CHEST FINDINGS Cardiovascular: Ascending thoracic aortic aneurysm stable at 4.0 cm diameter. Mediastinum/Nodes: Small thyroid  nodules measuring up to 1.0 cm in diameter. Not clinically significant; no follow-up imaging recommended (ref: J Am Coll Radiol. 2015 Feb;12(2): 143-50). No pathologic thoracic adenopathy.  Mild gynecomastia. Lungs/Pleura: Left upper lobectomy. Increased patchy airspace opacity scattered in the left lower lobe favoring pneumonia. New gas  in the left pleural fluid collection both anteriorly and in the sub pulmonic fluid; if the patient has had recent thoracentesis then this may be incidental pneumothorax component, but otherwise the possibility of early empyema with gas-forming organism might be considered. No pneumomediastinum. Musculoskeletal: Thoracic spondylosis. CT ABDOMEN PELVIS FINDINGS Hepatobiliary: Unremarkable Pancreas: Unremarkable Spleen: Unremarkable Adrenals/Urinary Tract: Stable 1.3 by 1.7 cm left adrenal mass shown to be an adenoma on 12/09/2022. No follow up is indicated. Mild scarring in the left mid kidney anteriorly. Stomach/Bowel: Thickened appendix mildly worsened from previous, up to 1.1 cm in diameter. Cannot exclude appendicitis or appendiceal mucocele. Trace stranding adjacent to the appendiceal tip for example on image 78 series 3. Vascular/Lymphatic: Left common iliac vein stent noted. No pathologic adenopathy in the abdomen. Reproductive: Unremarkable  Other: No supplemental non-categorized findings. Musculoskeletal: Bridging spurring at the sacroiliac joints anteriorly. Lower lumbar spondylosis and degenerative disc disease. IMPRESSION: 1. Increased patchy airspace opacity scattered in the left lower lobe favoring pneumonia. 2. New gas in the left pleural fluid collection both anteriorly and in the subpulmonic fluid; if the patient has had recent thoracentesis then this may be incidental pneumothorax component, but otherwise the possibility of early empyema with gas-forming organism might be considered. No pneumomediastinum observed. 3. Thickened appendix mildly worsened from previous, up to 1.1 cm in diameter. Cannot exclude appendicitis or appendiceal mucocele. Trace stranding adjacent to the appendiceal tip. 4. Stable ascending thoracic aortic aneurysm at 4.0 cm diameter. This can likely be followed in the context of the patient's oncology imaging. Otherwise, recommend annual imaging followup by CTA or MRA. This recommendation follows 2010 ACCF/AHA/AATS/ACR/ASA/SCA/SCAI/SIR/STS/SVM Guidelines for the Diagnosis and Management of Patients with Thoracic Aortic Disease. Circulation. 2010; 121: B147-W295. Aortic aneurysm NOS (ICD10-I71.9) 5. Stable left adrenal adenoma. 6. Mild gynecomastia. 7. Lower lumbar spondylosis and degenerative disc disease. 8. Bridging spurring at the sacroiliac joints anteriorly. These results were called by telephone at the time of interpretation on 10/05/2023 at 10:01 pm to provider William Mcguire , who verbally acknowledged these results. Electronically Signed   By: Freida Jes M.D.   On: 10/05/2023 22:07   DG Chest 2 View Result Date: 10/05/2023 CLINICAL DATA:  Suspected sepsis.  Fever, nausea, vomiting EXAM: CHEST - 2 VIEW COMPARISON:  05/05/2023.  CT 08/20/2023 FINDINGS: Heart and mediastinal contours within normal limits. Right lung clear. Volume loss on the left with small loculated left pleural effusion as seen on prior  CT. No definite confluent airspace opacity. Postoperative changes on the left. No acute bony abnormality. IMPRESSION: Postoperative changes on the left with volume loss and small loculated left pleural effusion. Electronically Signed   By: Janeece Mechanic M.D.   On: 10/05/2023 21:27     ASSESSMENT/PLAN:  This is a very pleasant 68 year old African American Male with stage IIA (T2b, N0, M0) non-small cell lung cancer, adenocarcinoma. He presented with a left upper lobe lesion. He was diagnosed in 2024. He is status post surgical resection by Dr. Deloise Ferries on March 18, 2023. Negative for any actionable mutations. PDL1 expression 6%.    He completed 4 cycles of adjuvant chemotherapy with carboplatin  for an AUC of 5 and Alimta. He was not a good canidate for cisplatin due to his CKD. His last dose of treatment was on 07/07/23.William Mcguire He tolerated it well except for fatigue and hiccups with cycle #1.  He also had some neutropenia and required G-CSF injections with Zarxio  x 2 with cycle #1.   The patient was admitted for PE  and DVT early January 2025.  He is currently on Eliquis .  The patient understands that this will be a continuous medication at this point in time.     The patient has had anemia, which predates his chemotherapy. He has required supportive care/blood transfusions. He was supposed to have a colonoscopy in January but this had to be rescheduled due to a hospitalization and delayed tue to recent PE and DVT. He had this repeated in early April 2025 which showed polyps, one of which was bleeding and cauterized.   He was recently hospitalized for pneumonia/sepsis.   He also had MGUS for several years and recently had repeat bone marrow biopsy performed.   The patient was seen with Dr. [name] who reviewed the results showing 34% plasma cells. Dr. [name] discussed progression which he believes needs to be monitored and treatment needs to be considered. The patient and his wife would like to follow  with Dr. Marton Sleeper.   Further evaluation needed for diagnosis and treatment planning. - Order 24-hour urine test for myeloma protein and renal assessment. - Coordinate with Dr. Marton Sleeper for follow-up and management. I have sent an inbasket message to her with the patient's preference to continue following her for this.  - Defer monthly labs and chest scans to Dr. Marton Sleeper.  Anemia Hemoglobin at 9.3, stable post-colonoscopy and polyp cauterization. No worsening symptoms reported. - Monitor hemoglobin and anemia symptoms. - Advise to contact office if symptoms worsen.  Lung cancer Lung cancer well-controlled, no new symptoms. Dr. [name] would be alright if Dr. Marton Sleeper would continue to follow the lung cancer if he will be following with her for treatment of multiple myeloma.   The patient was advised to call immediately if she has any concerning symptoms in the interval. The patient voices understanding of current disease status and treatment options and is in agreement with the current care plan. All questions were answered. The patient knows to call the clinic with any problems, questions or concerns. We can certainly see the patient much sooner if necessary  Orders Placed This Encounter  Procedures   24 Hr Ur UIFE/Light Chains/TP QN    Standing Status:   Future    Expected Date:   10/26/2023    Expiration Date:   10/25/2024     Adryan Druckenmiller L Tywana Robotham, PA-C 10/26/23  ADDENDUM: Hematology/Oncology Attending: I had a face-to-face encounter with the patient today.  I reviewed his record, lab, recent bone marrow biopsy and aspirate and recommended his care plan.  This is a very pleasant 68 years old African-American male with history of a stage IIa non-small cell lung cancer, adenocarcinoma diagnosed in 2024 with no actionable mutation and PD-L1 expression of 6%.  The patient is status post right upper lobectomy with lymph node sampling followed by 4 cycles of adjuvant systemic chemotherapy  with carboplatin  and Alimta completed July 07, 2023.  The patient also has a history of MGUS and was followed by Dr. Marton Sleeper in the past.  He was found on recent blood work to have significant elevation of his total protein and myeloma panel and showed significant increase in his IgG with increase in the free kappa light chain. I recommended for the patient to have a bone marrow biopsy and aspirate that was performed recently and showed plasma cell neoplasm with 34% of the plasma cells in the bone marrow biopsy. I had a lengthy discussion with the patient and his wife today about his current condition and possible treatment options.  The  patient and his wife are very comfortable with Dr. Marton Sleeper has been following his condition for the last several years.  He would like to see her back for management of the multiple myeloma.  We will arrange for the patient to have 24-hour urine protein electrophoresis in addition to light chain.  He had a PET scan that was performed several months ago for his lung cancer and it did not show any lytic lesion consistent with multiple myeloma but it was not whole-body PET scan and the turgor such we will make a decision regarding ordering another PET scan or just continue monitoring of his imaging studies for now. We will see the patient on as needed basis at this point if there is any concerning findings regarding his lung cancer but this will be also monitored by Dr. Marton Sleeper during his treatment for the multiple myeloma but will be happy to assist in the future if needed. The patient was advised to call immediately if he has any concerning symptoms. The total time spent in the appointment was 30 minutes. Disclaimer: This note was dictated with voice recognition software. Similar sounding words can inadvertently be transcribed and may be missed upon review. Aurelio Blower, MD

## 2023-10-25 NOTE — Assessment & Plan Note (Addendum)
 Likely prerenal from dehydration.  Creatinine 1.9, baseline 1.3-1.5. - Resolved with increased p.o. intake and IV fluids, resume enalapril  at lower dose - Encouraged to stay well hydrated

## 2023-10-25 NOTE — Assessment & Plan Note (Signed)
 He is s/p lobectomy.  He is currently undergoing adjuvant chemotherapy with carboplatin  for an AUC of 5 and Alimta. No new findings on February scan. Follow-up scans every three months. - Continue with scheduled scans every three months.

## 2023-10-25 NOTE — Assessment & Plan Note (Signed)
 TCM PERFORMED. A MEMBER OF THE CLINICAL TEAM SPOKE WITH THE PATIENT UPON DISCHARGE. DISCHARGE SUMMARY WAS REVIEWED IN FULL DETAIL DURING THE VISIT. MEDS RECONCILED AND COMPARED TO DISCHARGE MEDS. MEDICATION LIST WAS UPDATED AND REVIEWED WITH THE PATIENT. GREATER THAN 50% FACE TO FACE TIME WAS SPENT IN COUNSELING AND COORDINATION OF CARE. ALL QUESTIONS WERE ANSWERED TO THE SATISFACTION OF THE PATIENT.

## 2023-10-25 NOTE — Assessment & Plan Note (Signed)
 Chronic, stable. We will check A1c at his physical scheduled for May 2025. No med changes today.

## 2023-10-25 NOTE — Assessment & Plan Note (Signed)
 Aneurysm well-managed. Blood pressure control crucial to reduce growth risk. - Perform annual CT scan to monitor aneurysm. - Ensure blood pressure remains controlled.

## 2023-10-25 NOTE — Assessment & Plan Note (Signed)
 Likely secondary to left lower lobe pneumonia/presumed aspiration, now resolved . - Patient was not hypoxic during hospitalization -Atypical findings likely in the setting of recent instrumentation and likely aspiration event at home -PCCM/IR initially consulted for possible thoracentesis or chest tube placement but given minimal fluid and no obvious empyema or free air decision was made to hold off on instrumentation.

## 2023-10-25 NOTE — Assessment & Plan Note (Signed)
 Likely acute hemodilutional anemia on chronic anemia of chronic disease(CKD/lung cancer) -Now back to baseline after transfusion

## 2023-10-26 ENCOUNTER — Inpatient Hospital Stay: Attending: Hematology and Oncology | Admitting: Physician Assistant

## 2023-10-26 ENCOUNTER — Inpatient Hospital Stay

## 2023-10-26 VITALS — BP 138/72 | HR 69 | Temp 97.2°F | Resp 13 | Wt 223.9 lb

## 2023-10-26 DIAGNOSIS — Z902 Acquired absence of lung [part of]: Secondary | ICD-10-CM | POA: Insufficient documentation

## 2023-10-26 DIAGNOSIS — Z86718 Personal history of other venous thrombosis and embolism: Secondary | ICD-10-CM | POA: Insufficient documentation

## 2023-10-26 DIAGNOSIS — C9000 Multiple myeloma not having achieved remission: Secondary | ICD-10-CM | POA: Diagnosis not present

## 2023-10-26 DIAGNOSIS — E1122 Type 2 diabetes mellitus with diabetic chronic kidney disease: Secondary | ICD-10-CM | POA: Diagnosis not present

## 2023-10-26 DIAGNOSIS — Z86711 Personal history of pulmonary embolism: Secondary | ICD-10-CM | POA: Insufficient documentation

## 2023-10-26 DIAGNOSIS — M25421 Effusion, right elbow: Secondary | ICD-10-CM | POA: Diagnosis not present

## 2023-10-26 DIAGNOSIS — D472 Monoclonal gammopathy: Secondary | ICD-10-CM

## 2023-10-26 DIAGNOSIS — C3492 Malignant neoplasm of unspecified part of left bronchus or lung: Secondary | ICD-10-CM

## 2023-10-26 DIAGNOSIS — Z7901 Long term (current) use of anticoagulants: Secondary | ICD-10-CM | POA: Insufficient documentation

## 2023-10-26 DIAGNOSIS — D539 Nutritional anemia, unspecified: Secondary | ICD-10-CM | POA: Insufficient documentation

## 2023-10-26 DIAGNOSIS — C3411 Malignant neoplasm of upper lobe, right bronchus or lung: Secondary | ICD-10-CM | POA: Diagnosis not present

## 2023-10-26 DIAGNOSIS — N1831 Chronic kidney disease, stage 3a: Secondary | ICD-10-CM | POA: Diagnosis not present

## 2023-10-26 DIAGNOSIS — D61818 Other pancytopenia: Secondary | ICD-10-CM | POA: Insufficient documentation

## 2023-10-26 DIAGNOSIS — Z5112 Encounter for antineoplastic immunotherapy: Secondary | ICD-10-CM | POA: Diagnosis not present

## 2023-10-26 DIAGNOSIS — Z5111 Encounter for antineoplastic chemotherapy: Secondary | ICD-10-CM | POA: Diagnosis not present

## 2023-10-26 DIAGNOSIS — D649 Anemia, unspecified: Secondary | ICD-10-CM

## 2023-10-26 DIAGNOSIS — C3490 Malignant neoplasm of unspecified part of unspecified bronchus or lung: Secondary | ICD-10-CM

## 2023-10-26 LAB — CBC WITH DIFFERENTIAL (CANCER CENTER ONLY)
Abs Immature Granulocytes: 0.07 10*3/uL (ref 0.00–0.07)
Basophils Absolute: 0 10*3/uL (ref 0.0–0.1)
Basophils Relative: 0 %
Eosinophils Absolute: 0.1 10*3/uL (ref 0.0–0.5)
Eosinophils Relative: 2 %
HCT: 27.9 % — ABNORMAL LOW (ref 39.0–52.0)
Hemoglobin: 9.3 g/dL — ABNORMAL LOW (ref 13.0–17.0)
Immature Granulocytes: 1 %
Lymphocytes Relative: 24 %
Lymphs Abs: 1.4 10*3/uL (ref 0.7–4.0)
MCH: 30.1 pg (ref 26.0–34.0)
MCHC: 33.3 g/dL (ref 30.0–36.0)
MCV: 90.3 fL (ref 80.0–100.0)
Monocytes Absolute: 0.7 10*3/uL (ref 0.1–1.0)
Monocytes Relative: 12 %
Neutro Abs: 3.7 10*3/uL (ref 1.7–7.7)
Neutrophils Relative %: 61 %
Platelet Count: 226 10*3/uL (ref 150–400)
RBC: 3.09 MIL/uL — ABNORMAL LOW (ref 4.22–5.81)
RDW: 13.5 % (ref 11.5–15.5)
WBC Count: 6 10*3/uL (ref 4.0–10.5)
nRBC: 0 % (ref 0.0–0.2)

## 2023-10-26 LAB — CMP (CANCER CENTER ONLY)
ALT: 12 U/L (ref 0–44)
AST: 18 U/L (ref 15–41)
Albumin: 3.6 g/dL (ref 3.5–5.0)
Alkaline Phosphatase: 134 U/L — ABNORMAL HIGH (ref 38–126)
Anion gap: 4 — ABNORMAL LOW (ref 5–15)
BUN: 15 mg/dL (ref 8–23)
CO2: 26 mmol/L (ref 22–32)
Calcium: 9.4 mg/dL (ref 8.9–10.3)
Chloride: 103 mmol/L (ref 98–111)
Creatinine: 1.44 mg/dL — ABNORMAL HIGH (ref 0.61–1.24)
GFR, Estimated: 53 mL/min — ABNORMAL LOW (ref >60)
Glucose, Bld: 120 mg/dL — ABNORMAL HIGH (ref 70–99)
Potassium: 4.2 mmol/L (ref 3.5–5.1)
Sodium: 133 mmol/L — ABNORMAL LOW (ref 135–145)
Total Bilirubin: 0.4 mg/dL (ref 0.0–1.2)
Total Protein: 9.3 g/dL — ABNORMAL HIGH (ref 6.5–8.1)

## 2023-10-26 LAB — SAMPLE TO BLOOD BANK

## 2023-10-27 ENCOUNTER — Inpatient Hospital Stay: Admitting: Hematology and Oncology

## 2023-10-27 ENCOUNTER — Encounter: Payer: Self-pay | Admitting: Hematology and Oncology

## 2023-10-27 ENCOUNTER — Inpatient Hospital Stay

## 2023-10-27 ENCOUNTER — Encounter (HOSPITAL_COMMUNITY): Payer: Self-pay | Admitting: Physician Assistant

## 2023-10-27 ENCOUNTER — Other Ambulatory Visit: Payer: Self-pay | Admitting: Hematology and Oncology

## 2023-10-27 ENCOUNTER — Telehealth: Payer: Self-pay

## 2023-10-27 VITALS — BP 150/74 | HR 67 | Temp 98.1°F | Resp 18 | Ht 75.0 in | Wt 224.0 lb

## 2023-10-27 DIAGNOSIS — D539 Nutritional anemia, unspecified: Secondary | ICD-10-CM | POA: Diagnosis not present

## 2023-10-27 DIAGNOSIS — E1122 Type 2 diabetes mellitus with diabetic chronic kidney disease: Secondary | ICD-10-CM | POA: Diagnosis not present

## 2023-10-27 DIAGNOSIS — C3492 Malignant neoplasm of unspecified part of left bronchus or lung: Secondary | ICD-10-CM

## 2023-10-27 DIAGNOSIS — Z86711 Personal history of pulmonary embolism: Secondary | ICD-10-CM | POA: Diagnosis not present

## 2023-10-27 DIAGNOSIS — Z7901 Long term (current) use of anticoagulants: Secondary | ICD-10-CM | POA: Diagnosis not present

## 2023-10-27 DIAGNOSIS — N1831 Chronic kidney disease, stage 3a: Secondary | ICD-10-CM | POA: Diagnosis not present

## 2023-10-27 DIAGNOSIS — C9000 Multiple myeloma not having achieved remission: Secondary | ICD-10-CM

## 2023-10-27 DIAGNOSIS — Z794 Long term (current) use of insulin: Secondary | ICD-10-CM

## 2023-10-27 DIAGNOSIS — M25421 Effusion, right elbow: Secondary | ICD-10-CM | POA: Diagnosis not present

## 2023-10-27 DIAGNOSIS — Z5112 Encounter for antineoplastic immunotherapy: Secondary | ICD-10-CM | POA: Diagnosis not present

## 2023-10-27 DIAGNOSIS — C3411 Malignant neoplasm of upper lobe, right bronchus or lung: Secondary | ICD-10-CM | POA: Diagnosis not present

## 2023-10-27 DIAGNOSIS — D61818 Other pancytopenia: Secondary | ICD-10-CM | POA: Diagnosis not present

## 2023-10-27 DIAGNOSIS — Z5111 Encounter for antineoplastic chemotherapy: Secondary | ICD-10-CM | POA: Diagnosis not present

## 2023-10-27 DIAGNOSIS — Z86718 Personal history of other venous thrombosis and embolism: Secondary | ICD-10-CM | POA: Diagnosis not present

## 2023-10-27 DIAGNOSIS — Z902 Acquired absence of lung [part of]: Secondary | ICD-10-CM | POA: Diagnosis not present

## 2023-10-27 DIAGNOSIS — I2694 Multiple subsegmental pulmonary emboli without acute cor pulmonale: Secondary | ICD-10-CM

## 2023-10-27 MED ORDER — DEXAMETHASONE 4 MG PO TABS: ORAL_TABLET | ORAL | 1 refills | Status: DC

## 2023-10-27 MED ORDER — ACYCLOVIR 400 MG PO TABS: 400.0000 mg | ORAL_TABLET | Freq: Every day | ORAL | 6 refills | Status: DC

## 2023-10-27 MED ORDER — ALLOPURINOL 300 MG PO TABS: 300.0000 mg | ORAL_TABLET | Freq: Every day | ORAL | 1 refills | Status: DC

## 2023-10-27 NOTE — Progress Notes (Signed)
 START OFF PATHWAY REGIMEN - Multiple Myeloma and Other Plasma Cell Dyscrasias   [id number]:DaraVd (Daratumumab SUBQ + Bortezomib SUBQ + Dexamethasone  PO/IV) q21/28 Days:   Cycles 1 through 3: A cycle is every 21 days:     Dexamethasone       Bortezomib      Daratumumab and hyaluronidase-fihj    Cycles 4 through 8: A cycle is every 21 days:     Dexamethasone       Bortezomib      Daratumumab and hyaluronidase-fihj    Cycles 9 and beyond: A cycle is every 28 days:     Daratumumab and hyaluronidase-fihj   **Always confirm dose/schedule in your pharmacy ordering system**  Patient Characteristics: Multiple Myeloma, Newly Diagnosed, Transplant Eligible, Unknown Risk or Awaiting Test Results Disease Classification: Multiple Myeloma Therapeutic Status: Newly Diagnosed R2-ISS Staging: III Is Patient Eligible for Transplant<= Transplant Eligible Risk Status: Unknown Risk Intent of Therapy: Non-Curative / Palliative Intent, Discussed with Patient

## 2023-10-27 NOTE — Assessment & Plan Note (Addendum)
 The patient have prior diagnosis of smoldering myeloma.  He was referred to Dr. 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 I recommend PET/CT imaging to complete staging The patient will be going out of town next week  I plan to see him in 2 weeks to start him on treatment  We discussed risk, benefits, side effects of combination chemotherapy with daratumumab, Velcade and dexamethasone  weekly followed by bone marrow transplant evaluation We discussed risk, benefits, side effects of treatment and he agreed to proceed I am reluctant to recommend addition of lenalidomide given significant thrombotic history I am concerned about risk of diabetes and I recommend close monitoring and follow-up with primary care doctor as well as lifestyle changes while on treatment He had dental clearance a month ago I will add Zometa to be given every 3 months but not to be given on the same day of his first dose of treatment to avoid risk of toxicity We discussed the role of allopurinol for tumor lysis prophylaxis and acyclovir for antimicrobial prophylaxis

## 2023-10-27 NOTE — Telephone Encounter (Signed)
 Called and scheduled appt at 2:45 pm for today. He and his wife are aware of appt.

## 2023-10-27 NOTE — Assessment & Plan Note (Addendum)
 He has completed adjuvant treatment  I plan to order PET/CT imaging to stage his myeloma and we will also look at his lung cancer

## 2023-10-27 NOTE — Assessment & Plan Note (Addendum)
 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

## 2023-10-27 NOTE — Progress Notes (Signed)
 [facility] OFFICE PROGRESS NOTE  Patient Care Team: [name], MD as PCP - General (Internal Medicine) [name], RN as Oncology Nurse Navigator  Assessment & Plan Multiple myeloma not having achieved remission [facility]) The patient have prior diagnosis of smoldering myeloma.  He was referred to Dr. 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 I recommend PET/CT imaging to complete staging The patient will be going out of town next week  I plan to see him in 2 weeks to start him on treatment  We discussed risk, benefits, side effects of combination chemotherapy with daratumumab, Velcade and dexamethasone  weekly followed by bone marrow transplant evaluation We discussed risk, benefits, side effects of treatment and he agreed to proceed I am reluctant to recommend addition of lenalidomide given significant thrombotic history I am concerned about risk of diabetes and I recommend close monitoring and follow-up with primary care doctor as well as lifestyle changes while on treatment He had dental clearance a month ago I will add Zometa to be given every 3 months but not to be given on the same day of his first dose of treatment to avoid risk of toxicity We discussed the role of allopurinol for tumor lysis prophylaxis and acyclovir for antimicrobial prophylaxis Adenocarcinoma of left lung (HCC) He has completed adjuvant treatment  I plan to order PET/CT imaging to stage his myeloma and we will also look at his lung cancer Type 2 diabetes mellitus with stage 3 chronic kidney disease, with long-term current use of insulin , unspecified whether stage 3a or 3b CKD (HCC) He has significant  hypoglycemic episodes recently I recommend he reduce his long-acting insulin  tonight and to work with his primary care doctor for medication adjustment I recommend continuous blood sugar monitoring if possible due to high risk of hyperglycemia while on treatment CKD stage 3a, GFR 45-59 ml/min (HCC) Discussed importance of adequate fluid hydration Deficiency anemia He has multifactorial anemia, 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 He is not symptomatic Observe closely for now Multiple subsegmental pulmonary emboli without acute cor pulmonale (HCC) 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 to the point he needed thrombectomy He is doing well on anticoagulation therapy Given his current diagnosis of multiple myeloma, I recommend he continue anticoagulation therapy for a longer period of time than the 6 months that he was told Due to his history of significant clot burden, I am reluctant to add lenalidomide to his treatment right now  Orders Placed This Encounter  Procedures   NM PET Image Initial (PI) Whole Body    Standing Status:   Future    Expected Date:   11/03/2023    Expiration Date:   10/26/2024    If indicated for the ordered procedure, I authorize the administration of a radiopharmaceutical per Radiology protocol:   Yes    Preferred imaging location?:   [facility]   Pretreatment RBC phenotype    Obtain prior to daratumumab treatment.    Standing Status:   Standing    Number of Occurrences:   1    Expiration Date:   10/26/2024    Medication to be given::   Daratumumab  CBC with Differential (Cancer Center Only)    Standing Status:   Future    Expected Date:   11/10/2023    Expiration Date:   11/09/2024   CMP (Cancer Center only)    Standing Status:   Future    Expected Date:   11/10/2023    Expiration Date:   11/09/2024   CBC with Differential (Cancer Center Only)    Standing Status:   Future    Expected Date:   11/17/2023     Expiration Date:   11/16/2024   CMP (Cancer Center only)    Standing Status:   Future    Expected Date:   11/17/2023    Expiration Date:   11/16/2024   CBC with Differential (Cancer Center Only)    Standing Status:   Future    Expected Date:   11/24/2023    Expiration Date:   11/23/2024   CMP (Cancer Center only)    Standing Status:   Future    Expected Date:   11/24/2023    Expiration Date:   11/23/2024   CBC with Differential (Cancer Center Only)    Standing Status:   Future    Expected Date:   12/01/2023    Expiration Date:   11/30/2024   CMP (Cancer Center only)    Standing Status:   Future    Expected Date:   12/01/2023    Expiration Date:   11/30/2024   CBC with Differential (Cancer Center Only)    Standing Status:   Future    Expected Date:   12/08/2023    Expiration Date:   12/07/2024   CMP (Cancer Center only)    Standing Status:   Future    Expected Date:   12/08/2023    Expiration Date:   12/07/2024   CBC with Differential (Cancer Center Only)    Standing Status:   Future    Expected Date:   12/15/2023    Expiration Date:   12/14/2024   CMP (Cancer Center only)    Standing Status:   Future    Expected Date:   12/15/2023    Expiration Date:   12/14/2024   Multiple Myeloma Panel (SPEP&IFE w/QIG)    Standing Status:   Standing    Number of Occurrences:   22    Expiration Date:   10/26/2024   Kappa/lambda light chains    Standing Status:   Standing    Number of Occurrences:   22    Expiration Date:   10/26/2024   UPEP/UIFE/Light Chains/TP, 24-Hr Ur    Standing Status:   Future    Expected Date:   11/10/2023    Expiration Date:   10/26/2024   Type and screen    Obtain prior to daratumumab treatment.    Standing Status:   Standing    Number of Occurrences:   1    Expiration Date:   10/26/2024     [name], MD Overall, I spent 90 minutes with the patient today  INTERVAL HISTORY: he returns for surveillance follow-up for recent diagnosis of multiple myeloma I have not seen him  since October 2024 The patient was diagnosed with lung cancer, underwent surgery and adjuvant chemotherapy.  His treatment course was complicated by DVT and PE requiring thrombectomy, anemia requiring blood transfusion, recent acute on chronic renal failure secondary to dehydration and infection He was found to have elevated M protein in March He underwent bone marrow aspirate and biopsy a week ago which  confirmed diagnosis of multiple myeloma from his prior smoldering myeloma.  His wife requested transfer of care back to me for treatment of myeloma Currently, he is not symptomatic No recent bone pain His appetite is good He is noted to have recurrent hypoglycemia recently He has not been checking his blood sugar consistently He has tried to drink lots of liquid We spent majority of the time reviewing plan of care We discussed risk, benefits, side effects of chemotherapy with daratumumab, Velcade and dexamethasone  with eventual plan for bone marrow transplant We discussed the role of complete staging with PET/CT imaging  PHYSICAL EXAMINATION: ECOG PERFORMANCE STATUS: 1 - Symptomatic but completely ambulatory  Vitals:   10/27/23 1458  BP: (!) 150/74  Pulse: 67  Resp: 18  Temp: 98.1 F (36.7 C)  SpO2: 100%   Filed Weights   10/27/23 1458  Weight: 224 lb (101.6 kg)    Relevant data reviewed during this visit included CBC, CMP, bone marrow aspirate biopsy, CT imaging from April 2025  Oncology History Overview Note  FISH analysis showed gain of chromosome 11, duplication of 1 q.   Adenocarcinoma of left lung (HCC)  10/14/2020 Initial Diagnosis   Adenocarcinoma of left lung (HCC)   02/17/2023 Cancer Staging   Staging form: Lung, AJCC 8th Edition - Clinical stage from 02/17/2023: Stage IIA (cT2b, cN0, cM0) - Signed by [name], MD on 02/17/2023 Stage prefix: Initial diagnosis   03/18/2023 Pathology Results   SURGICAL PATHOLOGY CASE: [id number] PATIENT: Marvelyn Slim Surgical Pathology Report  Clinical History: lung cancer (cm)  FINAL MICROSCOPIC DIAGNOSIS:  A. LYMPH NODE, LEVEL 9, EXCISION:      One lymph node, negative for metastatic carcinoma (0/1).  B. LYMPH NODE, HILAR, EXCISION:      One lymph node, negative for metastatic carcinoma (0/1).  C. LYMPH NODE, LEVEL 6, EXCISION:      One lymph node, negative for metastatic carcinoma (0/1).  D. LYMPH NODE, LEVEL 6 #2, EXCISION:      One lymph node, negative for metastatic carcinoma (0/1).  E. LYMPH NODE, LEVEL 5, EXCISION:      One lymph node, negative for metastatic carcinoma (0/1).  F. LYMPH NODE, HILAR #2, EXCISION:      One lymph node, negative for metastatic carcinoma (0/1).  G. LUNG, LEFT UPPER LOBE, LOBECTOMY: Invasive mucinous adenocarcinoma with papillary features. Tumor size: 5.0 cm. Spread through air space is identified. Visceral pleural invasion is not identified. Bronchial margin and vascular margin are negative for carcinoma. Four lymph nodes, negative for metastatic carcinoma (0/4). Focus of benign subpleural organizing fibrous nodule with reactive pneumocytes (2 mm). See oncology table.  ONCOLOGY TABLE:  LUNG: Resection  Synchronous Tumors: Not applicable Total Number of Primary Tumors: 1 Procedure: Lobectomy Specimen Laterality: Left Tumor Focality: Unifocal Tumor Site: Left upper lobe Tumor Size: 5.0 cm      Total Tumor Size: 5.0 cm      Invasive Tumor Size (applies only to invasive nonmucinous adenocarcinoma with a lepidic           component): NA Histologic Type: Invasive mucinous adenocarcinoma Visceral Pleura Invasion: Not identified Direct Invasion of Adjacent Structures: No adjacent structures present Lymphovascular Invasion: Not identified Margins: All margins negative for invasive carcinoma      Closest Margin(s) to Invasive Carcinoma: 0.8 cm from the closest bronchial margin      Margin(s) Involved by Invasive Carcinoma: Not  applicable       Margin Status for Non-Invasive Tumor: Not applicable Treatment  Effect: No known presurgical therapy      Percentage of Residual Viable Tumor: NA Regional Lymph Nodes:      Number of Lymph Nodes Involved: 0                           Nodal Sites with Tumor: NA      Number of Lymph Nodes Examined: 10                      Nodal Sites Examined: Lever 2, 5, 6, 9, hilar Distant Metastasis:      Distant Site(s) Involved: Not applicable Pathologic Stage Classification (pTNM, AJCC 8th Edition): pT2b, pN0 Ancillary Studies: Can be performed upon request Representative Tumor Block: G3, G4, G5 Comment(s): None ([id number])    03/18/2023 Surgery   03/18/2023   Patient:  Marvelyn Slim Pre-Op Dx: Left upper lobe adenocarcinoma   Post-op Dx:  same Procedure: - Robotic assisted left video thoracoscopy - left upper lobectomy - Mediastinal lymph node sampling - Intercostal nerve block   Surgeon and Role:      * Lightfoot, Marinell Siad, MD - Primary   Assistant: B.Stehler, PA-C  An experienced assistant was required given the complexity of this surgery and the standard of surgical care. The assistant was needed for exposure, dissection, suctioning, retraction of delicate tissues and sutures, instrument exchange and for overall help during this procedure.     Anesthesia  general EBL:  200 ml Blood Administration: none Specimen:  left upper lobe, hilar and mediastinal nodes   Drains: 28 F argyle chest tube in left chest Counts: correct     Indications: 68yo male with left upper lobe 4cm pulmonary mass.  Previous biopsy has been negative.  He had a second biopsy performed at [facility] was positive for mucinous adenocarcinoma.  He was originally scheduled to have surgery at [facility], but this physician is not in network.    Of note on the PET scan there is another hypermetabolic area in the left lower lobe but this does not correspond with the specific pulmonary nodule.  I would like to  obtain a repeat CT chest for further evaluation of this left lower lobe lesion, as well as an MRI brain given the size of the original tumor.  If both of these are negative then he is agreeable to proceed with a left robotic assisted thoracoscopy with left upper lobectomy.  The risks and benefits have been discussed.    Findings: Enlarged lymph nodes   Operative Technique: After the risks, benefits and alternatives were thoroughly discussed, the patient was brought to the operative theatre.  Anesthesia was induced, and the patient was then placed in a lateral decubitus position and was prepped and draped in normal sterile fashion.  An appropriate surgical pause was performed, and pre-operative antibiotics were dosed accordingly.   We began by placing our 4 robotic ports in the the 7th intercostal space targeting the hilum of the lung.  A 12mm assistant port was placed in the 9th intercostal space in the anterior axillary line.  The robot was then docked and all instruments were passed under direct visualization.    The lung was then retracted superiorly, and the inferior pulmonary ligament was divided.  The hilum was mobilized anteriorly and posteriorly.  We identified the upper lobe pulmonary vein, and after careful isolation, it was divided with a vascular stapler.  We next moved to the pulmonary artery.  The artery  was then divided with a vascular load stapler.  The bronchus to the upper lobe was then isolated.  After a test clamp, with good ventilation of the remaining lung, the bronchus was then divided.  The fissure was completed, and the specimen was passed into an endocatch bag.  It was removed from the anterior access site.     Lymph nodes were then sampled at hilum and mediastinum.  The chest was irrigated, and an air leak test was performed.  An intercostal nerve block was performed under direct visualization.  A 28 F chest tube was then placed, and we watch the remaining lobes re-expand.  The  skin and soft tissue were closed with absorbable suture     The patient tolerated the procedure without any immediate complications, and was transferred to the PACU in stable condition.   Hilarie Lovely     04/27/2023 - 07/07/2023 Chemotherapy   Patient is on Treatment Plan : LUNG NSCLC Pemetrexed  + Carboplatin  q21d x 4 Cycles     Multiple myeloma (HCC)  10/20/2023 Bone Marrow Biopsy   Surgical Pathology  CASE: [id number]  PATIENT: Marvelyn Slim  Bone Marrow Report      Clinical History: Smoldering Myeloma      DIAGNOSIS:   BONE MARROW, ASPIRATE, CLOT, CORE:  - Variably cellular bone marrow with plasma cell neoplasm  - See comment   PERIPHERAL BLOOD:  - Normocytic-normochromic anemia  - Mild neutrophilic left shift   COMMENT:  The bone marrow shows increased number of plasma cells representing 34% of all cells in the aspirate associated with interstitial infiltrates and numerous variably sized aggregates in the clot and biopsy sections. The plasma cells display kappa light chain restriction consistent with plasma cell neoplasm.  The background shows trilineage hematopoiesis with nonspecific changes likely secondary in nature in this setting. There is no evidence of metastatic carcinoma.  Correlation with cytogenetic and FISH studies is recommended.  MICROSCOPIC DESCRIPTION:  PERIPHERAL BLOOD SMEAR: The red blood cells display mild anisopoikilocytosis with mild polychromasia. The white blood cells are normal in number with occasional myelocytes seen on scan.  The platelets are normal in number.   BONE MARROW ASPIRATE: Bone marrow particles present Erythroid precursors: Progressive maturation with some late precursors displaying nuclear cytoplasmic dyssynchrony Granulocytic precursors: Orderly and progressive maturation for the most Megakaryocytes: Abundant with scattered atypical large and/or hyperchromatic forms Lymphocytes/plasma cells: The plasma cells  are increased in number representing 34% of all cells associated with atypical cytologic features characterized by cytomegaly and/or small nucleoli.  Large lymphoid aggregates are not seen  TOUCH PREPARATIONS: A mixture of cell types but with increased number of atypical plasma cells  CLOT AND BIOPSY: The sections show cellularity ranging from 20 to 50% with interstitial infiltrates and variably sized aggregates of atypical plasma cells characterized by partially clumped chromatin and small nucleoli.  Large lymphoid aggregates or metastatic malignancy are not identified.  Immunohistochemical stains for CD138, cytokeratin AE1/AE3 in addition to in situ hybridization for kappa and lambda were performed on blocks B1 and C1 with appropriate controls.  CD138 highlights the increased plasma cell component which shows kappa light chain restriction.  No significant cytokeratin positivity identified.    10/27/2023 Initial Diagnosis   Multiple myeloma (HCC)   10/27/2023 Cancer Staging   Staging form: Plasma Cell Myeloma and Plasma Cell Disorders, AJCC 8th Edition - Clinical stage from 10/27/2023: RISS Stage II (Beta-2 -microglobulin (mg/L): 4.6, Albumin (g/dL): 3.6, ISS: Stage II, High-risk cytogenetics: Absent, LDH: Elevated) - Signed  by [name], MD on 10/27/2023 Stage prefix: Initial diagnosis Beta 2 microglobulin range (mg/L): 3.5 to 5.49 Albumin range (g/dL): Greater than or equal to 3.5 Cytogenetics: Trisomy 11, 1q addition   11/10/2023 -  Chemotherapy   Patient is on Treatment Plan : MYELOMA NEWLY DIAGNOSED TRANSPLANT CANDIDATE DaraVRd (Daratumumab SQ) with weekly bortezomib (D1,8,15) q21d x 6 Cycles (Induction/Consolidation)

## 2023-10-27 NOTE — Assessment & Plan Note (Addendum)
 He has significant hypoglycemic episodes recently I recommend he reduce his long-acting insulin  tonight and to work with his primary care doctor for medication adjustment I recommend continuous blood sugar monitoring if possible due to high risk of hyperglycemia while on treatment

## 2023-10-27 NOTE — Assessment & Plan Note (Addendum)
 Discussed importance of adequate fluid hydration

## 2023-10-27 NOTE — Assessment & Plan Note (Addendum)
 He has multifactorial anemia, 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 He is not symptomatic Observe closely for now

## 2023-10-28 ENCOUNTER — Ambulatory Visit (INDEPENDENT_AMBULATORY_CARE_PROVIDER_SITE_OTHER): Payer: Medicare Other | Admitting: Internal Medicine

## 2023-10-28 ENCOUNTER — Encounter: Payer: Self-pay | Admitting: Internal Medicine

## 2023-10-28 VITALS — BP 130/84 | HR 65 | Temp 98.2°F | Ht 75.0 in | Wt 224.0 lb

## 2023-10-28 DIAGNOSIS — Z0000 Encounter for general adult medical examination without abnormal findings: Secondary | ICD-10-CM

## 2023-10-28 DIAGNOSIS — C9000 Multiple myeloma not having achieved remission: Secondary | ICD-10-CM

## 2023-10-28 DIAGNOSIS — Z794 Long term (current) use of insulin: Secondary | ICD-10-CM | POA: Diagnosis not present

## 2023-10-28 DIAGNOSIS — I129 Hypertensive chronic kidney disease with stage 1 through stage 4 chronic kidney disease, or unspecified chronic kidney disease: Secondary | ICD-10-CM | POA: Diagnosis not present

## 2023-10-28 DIAGNOSIS — E1122 Type 2 diabetes mellitus with diabetic chronic kidney disease: Secondary | ICD-10-CM

## 2023-10-28 DIAGNOSIS — R351 Nocturia: Secondary | ICD-10-CM

## 2023-10-28 DIAGNOSIS — I82412 Acute embolism and thrombosis of left femoral vein: Secondary | ICD-10-CM

## 2023-10-28 DIAGNOSIS — I2694 Multiple subsegmental pulmonary emboli without acute cor pulmonale: Secondary | ICD-10-CM

## 2023-10-28 DIAGNOSIS — N182 Chronic kidney disease, stage 2 (mild): Secondary | ICD-10-CM

## 2023-10-28 DIAGNOSIS — D509 Iron deficiency anemia, unspecified: Secondary | ICD-10-CM

## 2023-10-28 LAB — POCT URINALYSIS DIPSTICK
Bilirubin, UA: NEGATIVE
Glucose, UA: NEGATIVE
Ketones, UA: NEGATIVE
Leukocytes, UA: NEGATIVE
Nitrite, UA: NEGATIVE
Protein, UA: NEGATIVE
Spec Grav, UA: 1.015 (ref 1.010–1.025)
Urobilinogen, UA: 0.2 U/dL
pH, UA: 5.5 (ref 5.0–8.0)

## 2023-10-28 LAB — TYPE AND SCREEN
ABO/RH(D): B POS
Antibody Screen: POSITIVE
DAT, IgG: POSITIVE

## 2023-10-28 NOTE — Patient Instructions (Signed)
 Health Maintenance, Male  Adopting a healthy lifestyle and getting preventive care are important in promoting health and wellness. Ask your health care provider about:  The right schedule for you to have regular tests and exams.  Things you can do on your own to prevent diseases and keep yourself healthy.  What should I know about diet, weight, and exercise?  Eat a healthy diet    Eat a diet that includes plenty of vegetables, fruits, low-fat dairy products, and lean protein.  Do not eat a lot of foods that are high in solid fats, added sugars, or sodium.  Maintain a healthy weight  Body mass index (BMI) is a measurement that can be used to identify possible weight problems. It estimates body fat based on height and weight. Your health care provider can help determine your BMI and help you achieve or maintain a healthy weight.  Get regular exercise  Get regular exercise. This is one of the most important things you can do for your health. Most adults should:  Exercise for at least 150 minutes each week. The exercise should increase your heart rate and make you sweat (moderate-intensity exercise).  Do strengthening exercises at least twice a week. This is in addition to the moderate-intensity exercise.  Spend less time sitting. Even light physical activity can be beneficial.  Watch cholesterol and blood lipids  Have your blood tested for lipids and cholesterol at 70 years of age, then have this test every 5 years.  You may need to have your cholesterol levels checked more often if:  Your lipid or cholesterol levels are high.  You are older than 70 years of age.  You are at high risk for heart disease.  What should I know about cancer screening?  Many types of cancers can be detected early and may often be prevented. Depending on your health history and family history, you may need to have cancer screening at various ages. This may include screening for:  Colorectal cancer.  Prostate cancer.  Skin cancer.  Lung  cancer.  What should I know about heart disease, diabetes, and high blood pressure?  Blood pressure and heart disease  High blood pressure causes heart disease and increases the risk of stroke. This is more likely to develop in people who have high blood pressure readings or are overweight.  Talk with your health care provider about your target blood pressure readings.  Have your blood pressure checked:  Every 3-5 years if you are 9-95 years of age.  Every year if you are 85 years old or older.  If you are between the ages of 29 and 29 and are a current or former smoker, ask your health care provider if you should have a one-time screening for abdominal aortic aneurysm (AAA).  Diabetes  Have regular diabetes screenings. This checks your fasting blood sugar level. Have the screening done:  Once every three years after age 23 if you are at a normal weight and have a low risk for diabetes.  More often and at a younger age if you are overweight or have a high risk for diabetes.  What should I know about preventing infection?  Hepatitis B  If you have a higher risk for hepatitis B, you should be screened for this virus. Talk with your health care provider to find out if you are at risk for hepatitis B infection.  Hepatitis C  Blood testing is recommended for:  Everyone born from [birth year] through 1967.  Anyone  with known risk factors for hepatitis C.  Sexually transmitted infections (STIs)  You should be screened each year for STIs, including gonorrhea and chlamydia, if:  You are sexually active and are younger than 70 years of age.  You are older than 70 years of age and your health care provider tells you that you are at risk for this type of infection.  Your sexual activity has changed since you were last screened, and you are at increased risk for chlamydia or gonorrhea. Ask your health care provider if you are at risk.  Ask your health care provider about whether you are at high risk for HIV. Your health care provider  may recommend a prescription medicine to help prevent HIV infection. If you choose to take medicine to prevent HIV, you should first get tested for HIV. You should then be tested every 3 months for as long as you are taking the medicine.  Follow these instructions at home:  Alcohol use  Do not drink alcohol if your health care provider tells you not to drink.  If you drink alcohol:  Limit how much you have to 0-2 drinks a day.  Know how much alcohol is in your drink. In the [place] one drink equals one 12 oz bottle of beer (355 mL), one 5 oz glass of wine (148 mL), or one 1 oz glass of hard liquor (44 mL).  Lifestyle  Do not use any products that contain nicotine or tobacco. These products include cigarettes, chewing tobacco, and vaping devices, such as e-cigarettes. If you need help quitting, ask your health care provider.  Do not use street drugs.  Do not share needles.  Ask your health care provider for help if you need support or information about quitting drugs.  General instructions  Schedule regular health, dental, and eye exams.  Stay current with your vaccines.  Tell your health care provider if:  You often feel depressed.  You have ever been abused or do not feel safe at home.  Summary  Adopting a healthy lifestyle and getting preventive care are important in promoting health and wellness.  Follow your health care provider's instructions about healthy diet, exercising, and getting tested or screened for diseases.  Follow your health care provider's instructions on monitoring your cholesterol and blood pressure.  This information is not intended to replace advice given to you by your health care provider. Make sure you discuss any questions you have with your health care provider.  Document Revised: 10/29/2020 Document Reviewed: 10/29/2020  Elsevier Patient Education  2024 [employer].

## 2023-10-28 NOTE — Progress Notes (Signed)
 I,[name], CMA,acting as a Neurosurgeon for William Dung, MD.,have documented all relevant documentation on the behalf of William Dung, MD,as directed by  William Dung, MD while in the presence of William Dung, MD.  Subjective:   Patient ID: William Mcguire , male    DOB: 1956-06-15 , 68 y.o.   MRN: [id number]  Chief Complaint  Patient presents with   Annual Exam    Patient presents today for annual exam. He reports compliance with medications. Denies headache, chest pain & sob.    Diabetes   Hypertension    HPI Discussed the use of AI scribe software for clinical note transcription with the patient, who gave verbal consent to proceed.  History of Present Illness William Mcguire is a 68 year old male with diabetes who presents for a physical and diabetes check.  He is currently using 16 units of Lantus  insulin . His blood sugar levels in the morning range from 68 to 90 mg/dL, with occasional readings below 70 mg/dL. He uses a regular glucose meter and is considering a continuous glucose monitor. He has an iPhone, but it is an older model, which may affect app compatibility.  He recently visited his oncologist and is scheduled to start treatment for myeloma. His plasma and protein levels are elevated, and a PET scan is planned.  No tingling in his feet and no tightness or swelling in his calves. His bowel movements are irregular, and he sometimes requires a stool softener despite consuming fruits and vegetables. His appetite has improved.  He experiences nocturia, getting up about three times a night to use the restroom, but does not feel that his urinary stream has changed. He has recently had his liver and kidney function checked, and his blood count was stable but still anemic.   Diabetes He presents for his follow-up diabetic visit. He has type 2 diabetes mellitus. His disease course has been stable. There are no hypoglycemic associated symptoms. Associated symptoms  include fatigue. Pertinent negatives for diabetes include no blurred vision, no chest pain, no polydipsia, no polyphagia and no polyuria. There are no hypoglycemic complications. Diabetic complications include nephropathy. Risk factors for coronary artery disease include diabetes mellitus, dyslipidemia, hypertension, male sex and sedentary lifestyle. His breakfast blood glucose is taken between 8-9 am. His breakfast blood glucose range is generally 90-110 mg/dl.  Hypertension This is a chronic problem. The current episode started more than 1 year ago. The problem has been gradually improving since onset. The problem is controlled. Pertinent negatives include no blurred vision, chest pain, palpitations or shortness of breath.     Past Medical History:  Diagnosis Date   Diabetes mellitus    High cholesterol    Hypertension      Family History  Problem Relation Age of Onset   Hypertension Mother    Multiple myeloma Mother    Hypertension Father    Diabetes Father      Current Outpatient Medications:    acyclovir  (ZOVIRAX ) 400 MG tablet, Take 1 tablet (400 mg total) by mouth daily., Disp: 30 tablet, Rfl: 6   allopurinol  (ZYLOPRIM ) 300 MG tablet, Take 1 tablet (300 mg total) by mouth daily., Disp: 30 tablet, Rfl: 1   apixaban  (ELIQUIS ) 5 MG TABS tablet, Take 1 tablet (5 mg total) by mouth 2 (two) times daily., Disp: 60 tablet, Rfl: 2   atorvastatin  (LIPITOR) 40 MG tablet, Take 1 tablet (40 mg total) by mouth daily. (Patient taking differently: Take 40 mg by  mouth in the morning.), Disp: 90 tablet, Rfl: 1   B-D ULTRAFINE III SHORT PEN 31G X 8 MM MISC, Inject into the skin as directed., Disp: , Rfl:    calcium  carbonate (TUMS EX) 750 MG chewable tablet, Chew 1 tablet by mouth daily., Disp: , Rfl:    cyanocobalamin  (VITAMIN B12) 1000 MCG/ML injection, INJECT 1 ML (1,000 MCG TOTAL) INTO THE MUSCLE EVERY 30 DAYS. (Patient taking differently: Inject 1,000 mcg into the muscle every 30 (thirty)  days.), Disp: 9 mL, Rfl: 1   dexamethasone  (DECADRON ) 4 MG tablet, Take 3 tabs in the morning with breakfast once every 4 weeks before chemotherapy, Disp: 12 tablet, Rfl: 1   Dulaglutide  (TRULICITY ) 1.5 MG/0.5ML SOAJ, Inject 1.5 mg into the skin every Sunday., Disp: , Rfl:    enalapril  (VASOTEC ) 10 MG tablet, Take 1 tablet (10 mg total) by mouth 2 (two) times daily., Disp: 60 tablet, Rfl: 0   folic acid  (FOLVITE ) 1 MG tablet, TAKE 1 TABLET BY MOUTH EVERY DAY, Disp: 90 tablet, Rfl: 1   glucose blood (ONETOUCH VERIO) test strip, Use as instructed to check blood sugars twice daily E11.69, Disp: 100 each, Rfl: 2   glucose blood test strip, Use as instructed to test blood sugar 3 times a day. Dx code: e11.65, Disp: 100 each, Rfl: 2   insulin  glargine (LANTUS ) 100 UNIT/ML injection, Inject 0.16 mLs (16 Units total) into the skin at bedtime. (Patient taking differently: Inject 19 Units into the skin at bedtime.), Disp: 10 mL, Rfl: 11   ondansetron  (ZOFRAN ) 8 MG tablet, Take 8 mg by mouth every 8 (eight) hours as needed for nausea or vomiting., Disp: , Rfl:    prochlorperazine  (COMPAZINE ) 10 MG tablet, Take 10 mg by mouth every 6 (six) hours as needed for nausea or vomiting., Disp: , Rfl:    sildenafil  (VIAGRA ) 100 MG tablet, TAKE 1 TABLET BY MOUTH EVERY DAY AS NEEDED (INSURANCE COVERS 10 TAB PER 77DAYS) (Patient taking differently: Take 100 mg by mouth as needed for erectile dysfunction.), Disp: 10 tablet, Rfl: 5   SYRINGE-NEEDLE, DISP, 3 ML (B-D INTE[employer]RA SYRINGE) 25G X 5/8" 3 ML MISC, USE AS DIRECTED, Disp: 12 each, Rfl: 24   No Known Allergies   Men's preventive visit. Patient Health Questionnaire (PHQ-2) is  Flowsheet Row Office Visit from 10/28/2023 in [facility]  PHQ-2 Total Score 0     . Patient is on a diabetic diet. Marital status: Married. Relevant history for alcohol use is:  Social History   Substance and Sexual Activity  Alcohol Use No  . Relevant  history for tobacco use is:  Social History   Tobacco Use  Smoking Status Never  Smokeless Tobacco Never  Tobacco Comments   n/a  .   Review of Systems  Constitutional:  Positive for fatigue.  HENT: Negative.    Eyes: Negative.  Negative for blurred vision.  Respiratory: Negative.  Negative for shortness of breath.   Cardiovascular: Negative.  Negative for chest pain and palpitations.  Gastrointestinal: Negative.   Endocrine: Negative.  Negative for polydipsia, polyphagia and polyuria.  Genitourinary: Negative.   Musculoskeletal: Negative.   Skin: Negative.   Allergic/Immunologic: Negative.   Neurological: Negative.   Hematological: Negative.      Today's Vitals   10/28/23 1002  BP: 130/84  Pulse: 65  Temp: 98.2 F (36.8 C)  SpO2: 98%  Weight: 224 lb (101.6 kg)  Height: 6\' 3"  (1.905 m)   Body mass index  is 28 kg/m.  Wt Readings from Last 3 Encounters:  10/28/23 224 lb (101.6 kg)  10/27/23 224 lb (101.6 kg)  10/26/23 223 lb 14.4 oz (101.6 kg)    Objective:  Physical Exam Vitals and nursing note reviewed.  Constitutional:      Appearance: Normal appearance.  HENT:     Head: Normocephalic and atraumatic.     Right Ear: Tympanic membrane, ear canal and external ear normal.     Left Ear: Tympanic membrane, ear canal and external ear normal.     Nose:     Comments: Masked     Mouth/Throat:     Comments: Masked  Eyes:     Extraocular Movements: Extraocular movements intact.     Conjunctiva/sclera: Conjunctivae normal.     Pupils: Pupils are equal, round, and reactive to light.  Cardiovascular:     Rate and Rhythm: Normal rate and regular rhythm.     Pulses: Normal pulses.          Dorsalis pedis pulses are 2+ on the right side and 2+ on the left side.     Heart sounds: Normal heart sounds.  Pulmonary:     Effort: Pulmonary effort is normal.     Breath sounds: Normal breath sounds.  Chest:     Chest wall: No tenderness.  Breasts:    Right: Normal. No  swelling, bleeding, inverted nipple, mass or nipple discharge.     Left: Normal. No swelling, bleeding, inverted nipple, mass or nipple discharge.  Abdominal:     General: Bowel sounds are normal.     Palpations: Abdomen is soft.  Genitourinary:    Comments: Deferred  Musculoskeletal:        General: Normal range of motion.     Cervical back: Normal range of motion and neck supple.  Feet:     Right foot:     Protective Sensation: 5 sites tested.  5 sites sensed.     Skin integrity: Callus and dry skin present.     Toenail Condition: Right toenails are normal.     Left foot:     Protective Sensation: 5 sites tested.  5 sites sensed.     Skin integrity: Callus and dry skin present.     Toenail Condition: Left toenails are normal.  Skin:    General: Skin is warm.  Neurological:     General: No focal deficit present.     Mental Status: He is alert.  Psychiatric:        Mood and Affect: Mood normal.        Behavior: Behavior normal.         Diabetic foot exam was performed with the following findings:   Normal sensation of 10g monofilament Intact posterior tibialis and dorsalis pedis pulses Pos callus b/l     Assessment And Plan:    Encounter for annual health examination Assessment & Plan: A full exam was performed.  DRE deferred, per patient request.  He is advised to get 30-45 minutes of regular exercise, no less than four to five days per week. Both weight-bearing and aerobic exercises are recommended.  He is advised to follow a healthy diet with at least six fruits/veggies per day, decrease intake of red meat and other saturated fats and to increase fish intake to twice weekly.  Meats/fish should not be fried -- baked, boiled or broiled is preferable. It is also important to cut back on your sugar intake.  Be sure to read labels -  try to avoid anything with added sugar, high fructose corn syrup or other sweeteners.  If you must use a sweetener, you can try stevia or  monkfruit.  It is also important to avoid artificially sweetened foods/beverages and diet drinks. Lastly, wear SPF 50 sunscreen on exposed skin and when in direct sunlight for an extended period of time.  Be sure to avoid fast food restaurants and aim for at least 60 ounces of water daily.       Type 2 diabetes mellitus with stage 2 chronic kidney disease, with long-term current use of insulin  [facility]) Assessment & Plan: Chronic, diabetic foot exam was performed. On 16 units of Lantus . Anticipated hyperglycemia due to steroids. Fasting sugars range 68-90 mg/dL, occasional <16 mg/dL. Discussed potential mealtime insulin  need once myeloma treatments start.. Weekly fasting sugar reports for insulin  adjustment. - Adjust Lantus  dosage based on weekly blood sugar reports. - Provide prescription for glucose sensor and assist with app setup. - Provide regular glucose meter as backup. - Consider mealtime insulin  if blood sugars are elevated.  Orders: -     POCT urinalysis dipstick -     Microalbumin / creatinine urine ratio -     EKG 12-Lead -     Lipid panel -     Hemoglobin A1c  Hypertensive nephropathy Assessment & Plan: Chronic, fair control. EKG performed, NSR w/o acute changes. Encouraged to follow low sodium diet.  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  -Repeat myeloma panel in March 2025 show significant elevation of M protein -Bone marrow biopsy performed a week ago confirmed greater than 30% plasma cell -Has staging w/ upcoming PET scan - Most recent hem/onc note reviewed in detail   Iron deficiency anemia, unspecified iron deficiency anemia type Assessment & Plan: Chronic, anemia monitored by oncologist. Recent blood count improved.  No additional labs  needed.   Nocturia -     PSA Total (Reflex To Free)   Return in 3 months (on 01/28/2024), or dm check, for 1 year physical. Patient was given opportunity to ask questions. Patient verbalized understanding of the plan and was able to repeat key elements of the plan. All questions were answered to their satisfaction.    I, William Dung, MD, have reviewed all documentation for this visit. The documentation on 10/28/23 for the exam, diagnosis, procedures, and orders are all accurate and complete.

## 2023-10-29 ENCOUNTER — Encounter (HOSPITAL_COMMUNITY): Payer: Self-pay | Admitting: Physician Assistant

## 2023-10-29 ENCOUNTER — Other Ambulatory Visit: Payer: Self-pay

## 2023-10-29 LAB — LIPID PANEL
Chol/HDL Ratio: 3.2 ratio (ref 0.0–5.0)
Cholesterol, Total: 137 mg/dL (ref 100–199)
HDL: 43 mg/dL (ref >39)
LDL Chol Calc (NIH): 82 mg/dL (ref 0–99)
Triglycerides: 58 mg/dL (ref 0–149)
VLDL Cholesterol Cal: 12 mg/dL (ref 5–40)

## 2023-10-29 LAB — PSA TOTAL (REFLEX TO FREE): Prostate Specific Ag, Serum: 0.5 ng/mL (ref 0.0–4.0)

## 2023-10-29 LAB — MICROALBUMIN / CREATININE URINE RATIO
Creatinine, Urine: 55.3 mg/dL
Microalb/Creat Ratio: 13 mg/g{creat} (ref 0–29)
Microalbumin, Urine: 7.4 ug/mL

## 2023-10-29 LAB — HEMOGLOBIN A1C
Est. average glucose Bld gHb Est-mCnc: 120 mg/dL
Hgb A1c MFr Bld: 5.8 % — ABNORMAL HIGH (ref 4.8–5.6)

## 2023-10-30 ENCOUNTER — Encounter: Payer: Self-pay | Admitting: Internal Medicine

## 2023-11-02 ENCOUNTER — Inpatient Hospital Stay

## 2023-11-03 ENCOUNTER — Encounter: Payer: Self-pay | Admitting: Hematology and Oncology

## 2023-11-03 ENCOUNTER — Other Ambulatory Visit: Payer: Self-pay | Admitting: Hematology and Oncology

## 2023-11-03 ENCOUNTER — Ambulatory Visit
Admission: RE | Admit: 2023-11-03 | Discharge: 2023-11-03 | Disposition: A | Discharge status: Home / Self Care | Source: Ambulatory Visit | Attending: Hematology and Oncology | Admitting: Hematology and Oncology

## 2023-11-03 DIAGNOSIS — C9000 Multiple myeloma not having achieved remission: Secondary | ICD-10-CM

## 2023-11-03 DIAGNOSIS — J90 Pleural effusion, not elsewhere classified: Secondary | ICD-10-CM | POA: Insufficient documentation

## 2023-11-03 LAB — PRETREATMENT RBC PHENOTYPE

## 2023-11-03 LAB — GLUCOSE, CAPILLARY: Glucose-Capillary: 74 mg/dL (ref 70–99)

## 2023-11-03 MED ORDER — FLUDEOXYGLUCOSE F - 18 (FDG) INJECTION
11.6000 | Freq: Once | INTRAVENOUS | Status: AC | PRN
  Administered 2023-11-03: 11.45 via INTRAVENOUS

## 2023-11-03 NOTE — Progress Notes (Signed)
 Pharmacist Chemotherapy Monitoring - Initial Assessment    Anticipated start date: 11/10/23   The following has been reviewed per standard work regarding the patient's treatment regimen: The patient's diagnosis, treatment plan and drug doses, and organ/hematologic function Lab orders and baseline tests specific to treatment regimen  The treatment plan start date, drug sequencing, and pre-medications Prior authorization status  Patient's documented medication list, including drug-drug interaction screen and prescriptions for anti-emetics and supportive care specific to the treatment regimen The drug concentrations, fluid compatibility, administration routes, and timing of the medications to be used The patient's access for treatment and lifetime cumulative dose history, if applicable  The patient's medication allergies and previous infusion related reactions, if applicable   Changes made to treatment plan:  N/A  Follow up needed:  Pending authorization for treatment    Lillar Bianca, PharmD, MBA

## 2023-11-08 ENCOUNTER — Encounter: Payer: Self-pay | Admitting: Internal Medicine

## 2023-11-08 NOTE — Assessment & Plan Note (Signed)
 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  -Repeat myeloma panel in March 2025 show significant elevation of M protein -Bone marrow biopsy performed a week ago confirmed greater than 30% plasma cell -Has staging w/ upcoming PET scan - Most recent hem/onc note reviewed in detail

## 2023-11-08 NOTE — Assessment & Plan Note (Addendum)
 Chronic, anemia monitored by oncologist. Recent blood count improved.  No additional labs needed.

## 2023-11-08 NOTE — Assessment & Plan Note (Signed)
 Chronic, fair control. EKG performed, NSR w/o acute changes. Encouraged to follow low sodium diet.  - Continue with enalapril  10mg  daily

## 2023-11-08 NOTE — Assessment & Plan Note (Signed)
 Chronic, diabetic foot exam was performed. On 16 units of Lantus . Anticipated hyperglycemia due to steroids. Fasting sugars range 68-90 mg/dL, occasional <62 mg/dL. Discussed potential mealtime insulin  need once myeloma treatments start.. Weekly fasting sugar reports for insulin  adjustment. - Adjust Lantus  dosage based on weekly blood sugar reports. - Provide prescription for glucose sensor and assist with app setup. - Provide regular glucose meter as backup. - Consider mealtime insulin  if blood sugars are elevated.

## 2023-11-08 NOTE — Assessment & Plan Note (Signed)
 A full exam was performed.  DRE deferred, per patient request.  He is advised to get 30-45 minutes of regular exercise, no less than four to five days per week. Both weight-bearing and aerobic exercises are recommended.  He is advised to follow a healthy diet with at least six fruits/veggies per day, decrease intake of red meat and other saturated fats and to increase fish intake to twice weekly.  Meats/fish should not be fried -- baked, boiled or broiled is preferable. It is also important to cut back on your sugar intake.  Be sure to read labels - try to avoid anything with added sugar, high fructose corn syrup or other sweeteners.  If you must use a sweetener, you can try stevia or monkfruit.  It is also important to avoid artificially sweetened foods/beverages and diet drinks. Lastly, wear SPF 50 sunscreen on exposed skin and when in direct sunlight for an extended period of time.  Be sure to avoid fast food restaurants and aim for at least 60 ounces of water daily.

## 2023-11-10 ENCOUNTER — Other Ambulatory Visit: Payer: Self-pay

## 2023-11-10 ENCOUNTER — Inpatient Hospital Stay

## 2023-11-10 ENCOUNTER — Encounter: Payer: Self-pay | Admitting: Hematology and Oncology

## 2023-11-10 ENCOUNTER — Ambulatory Visit: Payer: Self-pay

## 2023-11-10 ENCOUNTER — Inpatient Hospital Stay: Admitting: Hematology and Oncology

## 2023-11-10 VITALS — BP 150/70 | HR 80 | Temp 97.4°F | Resp 18 | Ht 75.0 in | Wt 223.8 lb

## 2023-11-10 VITALS — BP 121/70 | HR 70 | Temp 98.1°F | Resp 16

## 2023-11-10 DIAGNOSIS — C9000 Multiple myeloma not having achieved remission: Secondary | ICD-10-CM | POA: Diagnosis not present

## 2023-11-10 DIAGNOSIS — M25421 Effusion, right elbow: Secondary | ICD-10-CM | POA: Diagnosis not present

## 2023-11-10 DIAGNOSIS — Z5112 Encounter for antineoplastic immunotherapy: Secondary | ICD-10-CM | POA: Diagnosis not present

## 2023-11-10 DIAGNOSIS — D539 Nutritional anemia, unspecified: Secondary | ICD-10-CM

## 2023-11-10 DIAGNOSIS — Z86718 Personal history of other venous thrombosis and embolism: Secondary | ICD-10-CM | POA: Diagnosis not present

## 2023-11-10 DIAGNOSIS — N1831 Chronic kidney disease, stage 3a: Secondary | ICD-10-CM | POA: Diagnosis not present

## 2023-11-10 DIAGNOSIS — C3492 Malignant neoplasm of unspecified part of left bronchus or lung: Secondary | ICD-10-CM

## 2023-11-10 DIAGNOSIS — D61818 Other pancytopenia: Secondary | ICD-10-CM | POA: Diagnosis not present

## 2023-11-10 DIAGNOSIS — Z86711 Personal history of pulmonary embolism: Secondary | ICD-10-CM | POA: Diagnosis not present

## 2023-11-10 DIAGNOSIS — Z902 Acquired absence of lung [part of]: Secondary | ICD-10-CM | POA: Diagnosis not present

## 2023-11-10 DIAGNOSIS — Z5111 Encounter for antineoplastic chemotherapy: Secondary | ICD-10-CM | POA: Diagnosis not present

## 2023-11-10 DIAGNOSIS — Z7901 Long term (current) use of anticoagulants: Secondary | ICD-10-CM | POA: Diagnosis not present

## 2023-11-10 DIAGNOSIS — C3411 Malignant neoplasm of upper lobe, right bronchus or lung: Secondary | ICD-10-CM | POA: Diagnosis not present

## 2023-11-10 DIAGNOSIS — E1122 Type 2 diabetes mellitus with diabetic chronic kidney disease: Secondary | ICD-10-CM | POA: Diagnosis not present

## 2023-11-10 LAB — CMP (CANCER CENTER ONLY)
ALT: 11 U/L (ref 0–44)
AST: 16 U/L (ref 15–41)
Albumin: 3.8 g/dL (ref 3.5–5.0)
Alkaline Phosphatase: 147 U/L — ABNORMAL HIGH (ref 38–126)
Anion gap: 4 — ABNORMAL LOW (ref 5–15)
BUN: 16 mg/dL (ref 8–23)
CO2: 28 mmol/L (ref 22–32)
Calcium: 9.8 mg/dL (ref 8.9–10.3)
Chloride: 103 mmol/L (ref 98–111)
Creatinine: 1.32 mg/dL — ABNORMAL HIGH (ref 0.61–1.24)
GFR, Estimated: 58 mL/min — ABNORMAL LOW (ref >60)
Glucose, Bld: 108 mg/dL — ABNORMAL HIGH (ref 70–99)
Potassium: 4 mmol/L (ref 3.5–5.1)
Sodium: 135 mmol/L (ref 135–145)
Total Bilirubin: 0.4 mg/dL (ref 0.0–1.2)
Total Protein: 9.7 g/dL — ABNORMAL HIGH (ref 6.5–8.1)

## 2023-11-10 LAB — CBC WITH DIFFERENTIAL (CANCER CENTER ONLY)
Abs Immature Granulocytes: 0.05 10*3/uL (ref 0.00–0.07)
Basophils Absolute: 0 10*3/uL (ref 0.0–0.1)
Basophils Relative: 0 %
Eosinophils Absolute: 0.1 10*3/uL (ref 0.0–0.5)
Eosinophils Relative: 2 %
HCT: 28.2 % — ABNORMAL LOW (ref 39.0–52.0)
Hemoglobin: 9.5 g/dL — ABNORMAL LOW (ref 13.0–17.0)
Immature Granulocytes: 1 %
Lymphocytes Relative: 19 %
Lymphs Abs: 1.2 10*3/uL (ref 0.7–4.0)
MCH: 30.2 pg (ref 26.0–34.0)
MCHC: 33.7 g/dL (ref 30.0–36.0)
MCV: 89.5 fL (ref 80.0–100.0)
Monocytes Absolute: 0.6 10*3/uL (ref 0.1–1.0)
Monocytes Relative: 9 %
Neutro Abs: 4.4 10*3/uL (ref 1.7–7.7)
Neutrophils Relative %: 69 %
Platelet Count: 196 10*3/uL (ref 150–400)
RBC: 3.15 MIL/uL — ABNORMAL LOW (ref 4.22–5.81)
RDW: 13.2 % (ref 11.5–15.5)
WBC Count: 6.4 10*3/uL (ref 4.0–10.5)
nRBC: 0 % (ref 0.0–0.2)

## 2023-11-10 MED ORDER — MONTELUKAST SODIUM 10 MG PO TABS
10.0000 mg | ORAL_TABLET | Freq: Once | ORAL | Status: AC
  Administered 2023-11-10: 10 mg via ORAL
  Filled 2023-11-10: qty 1

## 2023-11-10 MED ORDER — DEXAMETHASONE 4 MG PO TABS: ORAL_TABLET | ORAL | Status: DC

## 2023-11-10 MED ORDER — ACETAMINOPHEN 325 MG PO TABS
650.0000 mg | ORAL_TABLET | Freq: Once | ORAL | Status: AC
  Administered 2023-11-10: 650 mg via ORAL
  Filled 2023-11-10: qty 2

## 2023-11-10 MED ORDER — DEXAMETHASONE 4 MG PO TABS
12.0000 mg | ORAL_TABLET | Freq: Once | ORAL | Status: AC
  Administered 2023-11-10: 12 mg via ORAL
  Filled 2023-11-10: qty 3

## 2023-11-10 MED ORDER — DEXCOM G7 SENSOR MISC: 3 refills | Status: DC

## 2023-11-10 MED ORDER — BORTEZOMIB CHEMO SQ INJECTION 3.5 MG (2.5MG/ML)
1.3000 mg/m2 | Freq: Once | INTRAMUSCULAR | Status: AC
  Administered 2023-11-10: 3 mg via SUBCUTANEOUS
  Filled 2023-11-10: qty 1.2

## 2023-11-10 MED ORDER — DIPHENHYDRAMINE HCL 25 MG PO CAPS
50.0000 mg | ORAL_CAPSULE | Freq: Once | ORAL | Status: AC
  Administered 2023-11-10: 50 mg via ORAL
  Filled 2023-11-10: qty 2

## 2023-11-10 MED ORDER — DARATUMUMAB-HYALURONIDASE-FIHJ 1800-30000 MG-UT/15ML ~~LOC~~ SOLN
1800.0000 mg | Freq: Once | SUBCUTANEOUS | Status: AC
  Administered 2023-11-10: 1800 mg via SUBCUTANEOUS
  Filled 2023-11-10: qty 15

## 2023-11-10 NOTE — Patient Instructions (Signed)
 [facility] - A DEPT OF [facility]  Discharge Instructions: Thank you for choosing [facility] to provide your oncology and hematology care.   If you have a lab appointment with the Cancer Center, please go directly to the Cancer Center and check in at the registration area.   Wear comfortable clothing and clothing appropriate for easy access to any Portacath or PICC line.   We strive to give you quality time with your provider. You may need to reschedule your appointment if you arrive late (15 or more minutes).  Arriving late affects you and other patients whose appointments are after yours.  Also, if you miss three or more appointments without notifying the office, you may be dismissed from the clinic at the provider's discretion.      For prescription refill requests, have your pharmacy contact our office and allow 72 hours for refills to be completed.    Today you received the following chemotherapy and/or immunotherapy agents: Velcade and Darzalex Faspro      To help prevent nausea and vomiting after your treatment, we encourage you to take your nausea medication as directed.  BELOW ARE SYMPTOMS THAT SHOULD BE REPORTED IMMEDIATELY: *FEVER GREATER THAN 100.4 F (38 C) OR HIGHER *CHILLS OR SWEATING *NAUSEA AND VOMITING THAT IS NOT CONTROLLED WITH YOUR NAUSEA MEDICATION *UNUSUAL SHORTNESS OF BREATH *UNUSUAL BRUISING OR BLEEDING *URINARY PROBLEMS (pain or burning when urinating, or frequent urination) *BOWEL PROBLEMS (unusual diarrhea, constipation, pain near the anus) TENDERNESS IN MOUTH AND THROAT WITH OR WITHOUT PRESENCE OF ULCERS (sore throat, sores in mouth, or a toothache) UNUSUAL RASH, SWELLING OR PAIN  UNUSUAL VAGINAL DISCHARGE OR ITCHING   Items with * indicate a potential emergency and should be followed up as soon as possible or go to the Emergency Department if any problems should occur.  Please show the CHEMOTHERAPY ALERT CARD  or IMMUNOTHERAPY ALERT CARD at check-in to the Emergency Department and triage nurse.  Should you have questions after your visit or need to cancel or reschedule your appointment, please contact [facility] - A DEPT OF [facility]  Dept: [phone]  and follow the prompts.  Office hours are 8:00 a.m. to 4:30 p.m. Monday - Friday. Please note that voicemails left after 4:00 p.m. may not be returned until the following business day.  We are closed weekends and major holidays. You have access to a nurse at all times for urgent questions. Please call the main number to the clinic Dept: [phone] and follow the prompts.   For any non-urgent questions, you may also contact your provider using MyChart. We now offer e-Visits for anyone 45 and older to request care online for non-urgent symptoms. For details visit [email or web address].   Also download the MyChart app! Go to the app store, search "MyChart", open the app, select [facility], and log in with your MyChart username and password.  Bortezomib Injection  What is this medication? BORTEZOMIB (bor TEZ oh mib) treats lymphoma. It may also be used to treat multiple myeloma, a type of bone marrow cancer. It works by blocking a protein that causes cancer cells to grow and multiply. This helps to slow or stop the spread of cancer cells. This medicine may be used for other purposes; ask your health care provider or pharmacist if you have questions. COMMON BRAND NAME(S): BORUZU, Velcade What should I tell my care team before I take this  medication? They need to know if you have any of these conditions: Dehydration Diabetes Heart disease Liver disease Tingling of the fingers or toes or other nerve disorder An unusual or allergic reaction to bortezomib, other medications, foods, dyes, or preservatives If you or your partner are pregnant or trying to get pregnant Breastfeeding How should I use this  medication? This medication is injected into a vein or under the skin. It is given by your care team in a hospital or clinic setting. Talk to your care team about the use of this medication in children. Special care may be needed. Overdosage: If you think you have taken too much of this medicine contact a poison control center or emergency room at once. NOTE: This medicine is only for you. Do not share this medicine with others. What if I miss a dose? Keep appointments for follow-up doses. It is important not to miss your dose. Call your care team if you are unable to keep an appointment. What may interact with this medication? Ketoconazole Rifampin This list may not describe all possible interactions. Give your health care provider a list of all the medicines, herbs, non-prescription drugs, or dietary supplements you use. Also tell them if you smoke, drink alcohol, or use illegal drugs. Some items may interact with your medicine. What should I watch for while using this medication? Your condition will be monitored carefully while you are receiving this medication. You may need blood work while taking this medication. This medication may affect your coordination, reaction time, or judgment. Do not drive or operate machinery until you know how this medication affects you. Sit up or stand slowly to reduce the risk of dizzy or fainting spells. Drinking alcohol with this medication can increase the risk of these side effects. This medication may increase your risk of getting an infection. Call your care team for advice if you get a fever, chills, sore throat, or other symptoms of a cold or flu. Do not treat yourself. Try to avoid being around people who are sick. Check with your care team if you have severe diarrhea, nausea, and vomiting, or if you sweat a lot. The loss of too much body fluid may make it dangerous for you to take this medication. Talk to your care team if you may be pregnant. Serious  birth defects can occur if you take this medication during pregnancy and for 7 months after the last dose. You will need a negative pregnancy test before starting this medication. Contraception is recommended while taking this medication and for 7 months after the last dose. Your care team can help you find the option that works for you. If your partner can get pregnant, use a condom during sex while taking this medication and for 4 months after the last dose. Do not breastfeed while taking this medication and for 2 months after the last dose. This medication may cause infertility. Talk to your care team if you are concerned about your fertility. What side effects may I notice from receiving this medication? Side effects that you should report to your care team as soon as possible: Allergic reactions--skin rash, itching, hives, swelling of the face, lips, tongue, or throat Bleeding--bloody or black, tar-like stools, vomiting blood or brown material that looks like coffee grounds, red or dark brown urine, small red or purple spots on skin, unusual bruising or bleeding Bleeding in the brain--severe headache, stiff neck, confusion, dizziness, change in vision, numbness or weakness of the face, arm, or leg,  trouble speaking, trouble walking, vomiting Bowel blockage--stomach cramping, unable to have a bowel movement or pass gas, loss of appetite, vomiting Heart failure--shortness of breath, swelling of the ankles, feet, or hands, sudden weight gain, unusual weakness or fatigue Infection--fever, chills, cough, sore throat, wounds that don't heal, pain or trouble when passing urine, general feeling of discomfort or being unwell Liver injury--right upper belly pain, loss of appetite, nausea, light-colored stool, dark yellow or brown urine, yellowing skin or eyes, unusual weakness or fatigue Low blood pressure--dizziness, feeling faint or lightheaded, blurry vision Lung injury--shortness of breath or trouble  breathing, cough, spitting up blood, chest pain, fever Pain, tingling, or numbness in the hands or feet Severe or prolonged diarrhea Stomach pain, bloody diarrhea, pale skin, unusual weakness or fatigue, decrease in the amount of urine, which may be signs of hemolytic uremic syndrome Sudden and severe headache, confusion, change in vision, seizures, which may be signs of posterior reversible encephalopathy syndrome (PRES) TTP--purple spots on the skin or inside the mouth, pale skin, yellowing skin or eyes, unusual weakness or fatigue, fever, fast or irregular heartbeat, confusion, change in vision, trouble speaking, trouble walking Tumor lysis syndrome (TLS)--nausea, vomiting, diarrhea, decrease in the amount of urine, dark urine, unusual weakness or fatigue, confusion, muscle pain or cramps, fast or irregular heartbeat, joint pain Side effects that usually do not require medical attention (report to your care team if they continue or are bothersome): Constipation Diarrhea Fatigue Loss of appetite Nausea This list may not describe all possible side effects. Call your doctor for medical advice about side effects. You may report side effects to FDA at 1-800-FDA-1088. Where should I keep my medication? This medication is given in a hospital or clinic. It will not be stored at home. NOTE: This sheet is a summary. It may not cover all possible information. If you have questions about this medicine, talk to your doctor, pharmacist, or health care provider.  2024 Elsevier/Gold Standard (2021-11-12 00:00:00)  Daratumumab Injection  What is this medication? DARATUMUMAB (dar a toom ue mab) treats multiple myeloma, a type of bone marrow cancer. It works by helping your immune system slow or stop the spread of cancer cells. It is a monoclonal antibody. This medicine may be used for other purposes; ask your health care provider or pharmacist if you have questions. COMMON BRAND NAME(S): DARZALEX What  should I tell my care team before I take this medication? They need to know if you have any of these conditions: Hereditary fructose intolerance Infection, such as chickenpox, herpes, hepatitis B Lung or breathing disease, such as asthma, COPD An unusual or allergic reaction to daratumumab, sorbitol, other medications, foods, dyes, or preservatives Pregnant or trying to get pregnant Breastfeeding How should I use this medication? This medication is injected into a vein. It is given by your care team in a hospital or clinic setting. Talk to your care team about the use of this medication in children. Special care may be needed. Overdosage: If you think you have taken too much of this medicine contact a poison control center or emergency room at once. NOTE: This medicine is only for you. Do not share this medicine with others. What if I miss a dose? Keep appointments for follow-up doses. It is important not to miss your dose. Call your care team if you are unable to keep an appointment. What may interact with this medication? Interactions have not been studied. This list may not describe all possible interactions. Give your health care  provider a list of all the medicines, herbs, non-prescription drugs, or dietary supplements you use. Also tell them if you smoke, drink alcohol, or use illegal drugs. Some items may interact with your medicine. What should I watch for while using this medication? Your condition will be monitored carefully while you are receiving this medication. This medication can cause serious allergic reactions. To reduce your risk, your care team may give you other medication to take before receiving this one. Be sure to follow the directions from your care team. This medication can affect the results of blood tests to match your blood type. These changes can last for up to 6 months after the final dose. Your care team will do blood tests to match your blood type before you  start treatment. Tell all of your care team that you are being treated with this medication before receiving a blood transfusion. This medication can affect the results of some tests used to determine treatment response; extra tests may be needed to evaluate response. Talk to your care team if you wish to become pregnant or think you are pregnant. This medication can cause serious birth defects if taken during pregnancy and for 3 months after the last dose. A reliable form of contraception is recommended while taking this medication and for 3 months after the last dose. Talk to your care team about effective forms of contraception. Do not breast-feed while taking this medication. What side effects may I notice from receiving this medication? Side effects that you should report to your care team as soon as possible: Allergic reactions--skin rash, itching, hives, swelling of the face, lips, tongue, or throat Infection--fever, chills, cough, sore throat, wounds that don't heal, pain or trouble when passing urine, general feeling of discomfort or being unwell Infusion reactions--chest pain, shortness of breath or trouble breathing, feeling faint or lightheaded Unusual bruising or bleeding Side effects that usually do not require medical attention (report to your care team if they continue or are bothersome): Constipation Diarrhea Fatigue Nausea Pain, tingling, or numbness in the hands or feet Swelling of the ankles, hands, or feet This list may not describe all possible side effects. Call your doctor for medical advice about side effects. You may report side effects to FDA at 1-800-FDA-1088. Where should I keep my medication? This medication is given in a hospital or clinic. It will not be stored at home. NOTE: This sheet is a summary. It may not cover all possible information. If you have questions about this medicine, talk to your doctor, pharmacist, or health care provider.  2024 Elsevier/Gold  Standard (2022-04-17 00:00:00)

## 2023-11-10 NOTE — Assessment & Plan Note (Addendum)
 Reviewed PET/CT imaging with the patient The a lot of changes in his lungs likely due to recent treatment for lung cancer Plan to repeat CT imaging of the chest in August

## 2023-11-10 NOTE — Assessment & Plan Note (Addendum)
 He has significant effusion near the right elbow joint We discussed risk and benefits of aspiration According to the patient, it has improved

## 2023-11-10 NOTE — Assessment & Plan Note (Addendum)
 He has multifactorial anemia, 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 He is not symptomatic Observe closely for now

## 2023-11-10 NOTE — Telephone Encounter (Signed)
  Chief Complaint: elbow swelling Symptoms: right elbow swelling Frequency: x 1 week following a fall/injury Pertinent Negatives: Patient denies redness, bruising. Disposition: [] ED /[] Urgent Care (no appt availability in office) / [x] Appointment(In office/virtual)/ []  [facility]/ [] Home Care/ [x] Refused Recommended Disposition /[] [facility]/ []  Follow-up with PCP Additional Notes: Wife, Wallene Gum, on phone for triage. She states there was bleeding from the right elbow after his fall. She states he is on blood thinner and the bleeding stopped after applying direct pressure. She has been applying ice to the elbow swelling. She states patient can use the right arm, full ROM. Wife is requesting an appointment tomorrow for patient since they will be at the office for his annual wellness visit. Offered soonest appt on Thursday and wife declined, stating he needs to be seen tomorrow while they are at the office. Advised wife I will send a message to the clinic making them aware of her request and they will call her back if any appts become available.  Copied from CRM [id number]. Topic: Clinical - Red Word Triage >> Nov 10, 2023  2:28 PM Hassie Lint wrote: Red Word that prompted transfer to Nurse Triage: Patient had a fall on 5/14 and injured his elbow. Had some bleeding on the day he fell, wound has closed, but now the elbow is swollen and painful. States seems like there maybe fluid around it. Reason for Disposition  MILD OR MODERATE joint swelling (e.g., feels or looks mildly swollen or puffy)  Answer Assessment - Initial Assessment Questions 1. LOCATION: "Where is the swelling?" (e.g., left, right, both elbows)     Right.  2. SIZE and DESCRIPTION: "What does the swelling look like?" (e.g., entire elbow, localized)     Tip of the elbow, around the size  of a golf ball.  3. ONSET: "When did the swelling start?" "Does it come and go, or is it there all the time?"     Patient had a  fall on the morning of 5/14 and that night noticed swelling.  4. SETTING: "Has there been any recent work, exercise or other activity that involved that part of the body?"      Injury from fall.  5. AGGRAVATING FACTORS: "What makes the elbow swelling worse?" (e.g., work, sports activities)     Gradual since the fall, nothing makes it worse.  6. ASSOCIATED SYMPTOMS: "Is there any pain or redness?"     Denies pain, calls it "soreness".  7. OTHER SYMPTOMS: "Do you have any other symptoms?" (e.g., fever)     Denies.  8. PREGNANCY: "Is there any chance you are pregnant?" "When was your last menstrual period?"     N/A.  Protocols used: Elbow Swelling-A-AH

## 2023-11-10 NOTE — Telephone Encounter (Signed)
 LVM for patient wife - advised to go to urgent care or [facility] for xray and treatment.

## 2023-11-10 NOTE — Assessment & Plan Note (Addendum)
 Discussed importance of adequate fluid hydration

## 2023-11-10 NOTE — Progress Notes (Signed)
 RBC and type& Screen were completed on 10/27/23 to fulfill treatment parameters.  Patient did well with his injections today. No issues during the 2 hour observation.   VSS- BP 121/70 (BP Location: Right Arm, Patient Position: Sitting)   Pulse 70   Temp 98.1 F (36.7 C) (Oral)   Resp 16   SpO2 100%    Wife present for education. Ambulatory to the lobby.

## 2023-11-10 NOTE — Assessment & Plan Note (Addendum)
 The patient have prior diagnosis of smoldering myeloma.  He was referred to Dr. 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 PET/CT imaging was reviewed with the patient and his wife which show no evidence of significant bone involvement/fractures  To complete his workup, we will submit 24-hour urine collection which the patient forgot to collect but will do so next week He is ready to start treatment I reminded him to take dexamethasone  before treatment each week He forgot to take his dexamethasone  today and we will give it to him in the infusion room I warned him about risk of severe hyperglycemia due to his history of diabetes He had dental clearance recently and I will start him on Zometa next week We discussed the role of allopurinol  for tumor lysis prophylaxis and acyclovir  for antimicrobial prophylaxis

## 2023-11-10 NOTE — Progress Notes (Signed)
 [facility] OFFICE PROGRESS NOTE  Patient Care Team: [name], MD as PCP - General (Internal Medicine) [name], RN as Oncology Nurse Navigator  Assessment & Plan Multiple myeloma not having achieved remission [facility]) The patient have prior diagnosis of smoldering myeloma.  He was referred to Dr. 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 PET/CT imaging was reviewed with the patient and his wife which show no evidence of significant bone involvement/fractures  To complete his workup, we will submit 24-hour urine collection which the patient forgot to collect but will do so next week He is ready to start treatment I reminded him to take dexamethasone  before treatment each week He forgot to take his dexamethasone  today and we will give it to him in the infusion room I warned him about risk of severe hyperglycemia due to his history of diabetes He had dental clearance recently and I will start him on Zometa next week We discussed the role of allopurinol  for tumor lysis prophylaxis and acyclovir  for antimicrobial prophylaxis Adenocarcinoma of left lung [facility]) Reviewed PET/CT imaging with the patient The a lot of changes in his lungs likely due to recent treatment for lung cancer Plan to repeat CT imaging of the chest in August Deficiency anemia He has multifactorial anemia, 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 He is not symptomatic Observe closely for now CKD stage 3a, GFR 45-59 ml/min (HCC) Discussed importance of adequate fluid hydration Effusion of right elbow He has significant effusion near the right elbow joint We discussed risk and benefits of  aspiration According to the patient, it has improved   Orders Placed This Encounter  Procedures   UPEP/UIFE/Light Chains/TP, 24-Hr Ur    Standing Status:   Future    Expiration Date:   11/09/2024     [name], MD  INTERVAL HISTORY: he returns for treatment follow-up  A week ago, he fell and injured his right elbow but no major injury except for effusion near the elbow which has improved in size No recent infection or bone pain He forgot to collect 24-hour urine collection He forgot to take his dexamethasone  this morning I reviewed PET/CT imaging with the patient and discussed side effects to be expected from treatment  PHYSICAL EXAMINATION: ECOG PERFORMANCE STATUS: 1 - Symptomatic but completely ambulatory  Vitals:   11/10/23 0830  BP: (!) 150/70  Pulse: 80  Resp: 18  Temp: (!) 97.4 F (36.3 C)  SpO2: 100%   Filed Weights   11/10/23 0830  Weight: 223 lb 12.8 oz (101.5 kg)    Relevant data reviewed during this visit included CBC, CMP, PET/CT imaging from May 2025 in comparison with imaging from 2024

## 2023-11-11 ENCOUNTER — Ambulatory Visit (INDEPENDENT_AMBULATORY_CARE_PROVIDER_SITE_OTHER): Payer: Medicare Other

## 2023-11-11 ENCOUNTER — Ambulatory Visit: Payer: Self-pay | Admitting: Internal Medicine

## 2023-11-11 VITALS — BP 130/76 | HR 77 | Temp 98.3°F | Ht 75.0 in | Wt 227.0 lb

## 2023-11-11 DIAGNOSIS — Z0000 Encounter for general adult medical examination without abnormal findings: Secondary | ICD-10-CM

## 2023-11-11 LAB — KAPPA/LAMBDA LIGHT CHAINS
Kappa free light chain: 392.7 mg/L — ABNORMAL HIGH (ref 3.3–19.4)
Kappa, lambda light chain ratio: 36.03 — ABNORMAL HIGH (ref 0.26–1.65)
Lambda free light chains: 10.9 mg/L (ref 5.7–26.3)

## 2023-11-11 NOTE — Patient Instructions (Signed)
 [name] , Thank you for taking time out of your busy schedule to complete your Annual Wellness Visit with me. I enjoyed our conversation and look forward to speaking with you again next year. I, as well as your care team,  appreciate your ongoing commitment to your health goals. Please review the following plan we discussed and let me know if I can assist you in the future. Your Game plan/ To Do List    Referrals: If you haven't heard from the office you've been referred to, please reach out to them at the phone provided.  N/a Follow up Visits: Next Medicare AWV with our clinical staff: 12/28/2024 at 10:40   Have you seen your provider in the last 6 months (3 months if uncontrolled diabetes)? Yes Next Office Visit with your provider: 01/28/2024 at 11:40  Clinician Recommendations:  Aim for 30 minutes of exercise or brisk walking, 6-8 glasses of water, and 5 servings of fruits and vegetables each day.       This is a list of the screening recommended for you and due dates:  Health Maintenance  Topic Date Due   COVID-19 Vaccine (5 - 2024-25 season) 11/27/2023*   Flu Shot  01/22/2024   Hemoglobin A1C  04/29/2024   Eye exam for diabetics  09/03/2024   Yearly kidney health urinalysis for diabetes  10/27/2024   Complete foot exam   10/27/2024   Yearly kidney function blood test for diabetes  11/09/2024   Medicare Annual Wellness Visit  11/10/2024   Colon Cancer Screening  09/28/2028   DTaP/Tdap/Td vaccine (2 - Td or Tdap) 08/18/2032   Pneumonia Vaccine  Completed   Hepatitis C Screening  Completed   Zoster (Shingles) Vaccine  Completed   HPV Vaccine  Aged Out   Meningitis B Vaccine  Aged Out  *Topic was postponed. The date shown is not the original due date.    Advanced directives: (Copy Requested) Please bring a copy of your health care power of attorney and living will to the office to be added to your chart at your convenience. You can mail to [facility] [address]  [address] [place], [place] [address] or email to [email or web address] Advance Care Planning is important because it:  [x]  Makes sure you receive the medical care that is consistent with your values, goals, and preferences  [x]  It provides guidance to your family and loved ones and reduces their decisional burden about whether or not they are making the right decisions based on your wishes.  Follow the link provided in your after visit summary or read over the paperwork we have mailed to you to help you started getting your Advance Directives in place. If you need assistance in completing these, please reach out to us  so that we can help you!  See attachments for Preventive Care and Fall Prevention Tips.

## 2023-11-11 NOTE — Progress Notes (Signed)
 Subjective:   William Mcguire is a 68 y.o. who presents for a Medicare Wellness preventive visit.  As a reminder, Annual Wellness Visits don't include a physical exam, and some assessments may be limited, especially if this visit is performed virtually. We may recommend an in-person follow-up visit with your provider if needed.  Visit Complete: In person    Persons Participating in Visit: Patient.  AWV Questionnaire: No: Patient Medicare AWV questionnaire was not completed prior to this visit.  Cardiac Risk Factors include: advanced age (>13men, >38 women);diabetes mellitus;hypertension;male gender     Objective:     Today's Vitals   11/11/23 1048  BP: 130/76  Pulse: 77  Temp: 98.3 F (36.8 C)  TempSrc: Oral  SpO2: 99%  Weight: 227 lb (103 kg)  Height: 6\' 3"  (1.905 m)   Body mass index is 28.37 kg/m.     11/11/2023   10:52 AM 10/20/2023    9:00 AM 10/06/2023    2:00 PM 10/05/2023    9:40 PM 09/29/2023    9:48 AM 06/25/2023    9:48 AM 03/26/2023   10:08 PM  Advanced Directives  Does Patient Have a Medical Advance Directive? Yes Yes Yes Yes Yes Yes Yes  Type of Advance Directive Living will Living will;Healthcare Power of [employer] Power of [place];Living will Living will Living will Healthcare Power of [place];Living will Healthcare Power of Attorney  Does patient want to make changes to medical advance directive?  No - Patient declined No - Patient declined   Yes (Inpatient - patient defers changing a medical advance directive and declines information at this time) No - Patient declined  Copy of Healthcare Power of Attorney in Chart?  No - copy requested No - copy requested   No - copy requested No - copy requested    Current Medications (verified) Outpatient Encounter Medications as of 11/11/2023  Medication Sig   acyclovir  (ZOVIRAX ) 400 MG tablet Take 1 tablet (400 mg total) by mouth daily.   allopurinol  (ZYLOPRIM ) 300 MG tablet Take 1 tablet (300 mg  total) by mouth daily.   apixaban  (ELIQUIS ) 5 MG TABS tablet Take 1 tablet (5 mg total) by mouth 2 (two) times daily.   atorvastatin  (LIPITOR) 40 MG tablet Take 1 tablet (40 mg total) by mouth daily. (Patient taking differently: Take 40 mg by mouth in the morning.)   B-D ULTRAFINE III SHORT PEN 31G X 8 MM MISC Inject into the skin as directed.   calcium  carbonate (TUMS EX) 750 MG chewable tablet Chew 1 tablet by mouth daily.   Continuous Glucose Sensor (DEXCOM G7 SENSOR) MISC Use as directed to check blood sugars. Change every 10 days   cyanocobalamin  (VITAMIN B12) 1000 MCG/ML injection INJECT 1 ML (1,000 MCG TOTAL) INTO THE MUSCLE EVERY 30 DAYS. (Patient taking differently: Inject 1,000 mcg into the muscle every 30 (thirty) days.)   dexamethasone  (DECADRON ) 4 MG tablet Take 3 tabs in the morning with breakfast once every week before chemotherapy   Dulaglutide  (TRULICITY ) 1.5 MG/0.5ML SOAJ Inject 1.5 mg into the skin every Sunday.   enalapril  (VASOTEC ) 10 MG tablet Take 1 tablet (10 mg total) by mouth 2 (two) times daily.   folic acid  (FOLVITE ) 1 MG tablet TAKE 1 TABLET BY MOUTH EVERY DAY   glucose blood (ONETOUCH VERIO) test strip Use as instructed to check blood sugars twice daily E11.69   glucose blood test strip Use as instructed to test blood sugar 3 times a day. Dx code: e11.65  insulin  glargine (LANTUS ) 100 UNIT/ML injection Inject 0.16 mLs (16 Units total) into the skin at bedtime. (Patient taking differently: Inject 19 Units into the skin at bedtime.)   ondansetron  (ZOFRAN ) 8 MG tablet Take 8 mg by mouth every 8 (eight) hours as needed for nausea or vomiting.   prochlorperazine  (COMPAZINE ) 10 MG tablet Take 10 mg by mouth every 6 (six) hours as needed for nausea or vomiting.   sildenafil  (VIAGRA ) 100 MG tablet TAKE 1 TABLET BY MOUTH EVERY DAY AS NEEDED (INSURANCE COVERS 10 TAB PER 77DAYS) (Patient taking differently: Take 100 mg by mouth as needed for erectile dysfunction.)    SYRINGE-NEEDLE, DISP, 3 ML (B-D INTEGRA SYRINGE) 25G X 5/8" 3 ML MISC USE AS DIRECTED   No facility-administered encounter medications on file as of 11/11/2023.    Allergies (verified) Patient has no known allergies.   History: Past Medical History:  Diagnosis Date   Diabetes mellitus    High cholesterol    Hypertension    Past Surgical History:  Procedure Laterality Date   BIOPSY OF SKIN SUBCUTANEOUS TISSUE AND/OR MUCOUS MEMBRANE  09/29/2023   Procedure: BIOPSY, SKIN, SUBCUTANEOUS TISSUE, OR MUCOUS MEMBRANE;  Surgeon: Felecia Hopper, MD;  Location: [facility];  Service: Gastroenterology;;   BRONCHIAL BIOPSY  01/05/2023   Procedure: BRONCHIAL BIOPSIES;  Surgeon: Prudy Brownie, DO;  Location: [facility];  Service: Pulmonary;;   BRONCHIAL BRUSHINGS  01/05/2023   Procedure: BRONCHIAL BRUSHINGS;  Surgeon: Prudy Brownie, DO;  [facility]: [facility];  Service: Pulmonary;;   BRONCHIAL NEEDLE ASPIRATION BIOPSY  01/05/2023   Procedure: BRONCHIAL NEEDLE ASPIRATION BIOPSIES;  Surgeon: Prudy Brownie, DO;  Location: [facility];  Service: Pulmonary;;   COLONOSCOPY N/A 09/29/2023   Procedure: COLONOSCOPY;  Surgeon: Felecia Hopper, MD;  Location: [facility];  Service: Gastroenterology;  Laterality: N/A;   ESOPHAGOGASTRODUODENOSCOPY N/A 09/29/2023   Procedure: EGD (ESOPHAGOGASTRODUODENOSCOPY);  Surgeon: Felecia Hopper, MD;  Location: [facility];  Service: Gastroenterology;  Laterality: N/A;   INTERCOSTAL NERVE BLOCK Left 03/18/2023   Procedure: INTERCOSTAL NERVE BLOCK;  Surgeon: Hilarie Lovely, MD;  Location: [facility];  Service: Thoracic;  Laterality: Left;   KNEE SURGERY Left 2004   torn cartilage   LYMPH NODE BIOPSY Left 03/18/2023   Procedure: LYMPH NODE BIOPSY;  Surgeon: Hilarie Lovely, MD;  Location: [facility];  Service: Thoracic;  Laterality: Left;   PATELLAR TENDON REPAIR  06/11/2011   Procedure: PATELLA TENDON REPAIR;  Surgeon: Jasmine Mesi;  Location: [facility];   Service: Orthopedics;  Laterality: Right;   PERIPHERAL VASCULAR INTERVENTION  06/26/2023   Procedure: PERIPHERAL VASCULAR INTERVENTION;  Surgeon: Carlene Che, MD;  [facility];  Service: Cardiovascular;;   PERIPHERAL VASCULAR THROMBECTOMY Left 06/26/2023   Procedure: PERIPHERAL VASCULAR THROMBECTOMY;  Surgeon: Carlene Che, MD;  [facility];  Service: Cardiovascular;  Laterality: Left;   PERIPHERAL VASCULAR ULTRASOUND/IVUS  06/26/2023   Procedure: Peripheral Vascular Ultrasound/IVUS;  Surgeon: Carlene Che, MD;  [facility];  Service: Cardiovascular;;   POLYPECTOMY  09/29/2023   Procedure: POLYPECTOMY, STOMACH AND COLON;  Surgeon: Felecia Hopper, MD;  Location: [facility];  Service: Gastroenterology;;   Family History  Problem Relation Age of Onset   Hypertension Mother    Multiple myeloma Mother    Hypertension Father    Diabetes Father    Social History   Socioeconomic History   Marital status: Married    Spouse name: Not on file  Number of children: Not on file   Years of education: Not on file   Highest education level: Not on file  Occupational History   Not on file  Tobacco Use   Smoking status: Never   Smokeless tobacco: Never   Tobacco comments:    n/a  Vaping Use   Vaping status: Never Used  Substance and Sexual Activity   Alcohol use: No   Drug use: No   Sexual activity: Not on file  Other Topics Concern   Not on file  Social History Narrative   Not on file   Social Drivers of Health   Financial Resource Strain: Low Risk  (11/11/2023)   Overall Financial Resource Strain (CARDIA)    Difficulty of Paying Living Expenses: Not hard at all  Food Insecurity: No Food Insecurity (11/11/2023)   Hunger Vital Sign    Worried About Running Out of Food in the Last Year: Never true    Ran Out of Food in the Last Year: Never true  Transportation Needs: No Transportation Needs (11/11/2023)   PRAPARE -  Administrator, Civil Service (Medical): No    Lack of Transportation (Non-Medical): No  Physical Activity: Insufficiently Active (11/11/2023)   Exercise Vital Sign    Days of Exercise per Week: 1 day    Minutes of Exercise per Session: 60 min  Stress: No Stress Concern Present (11/11/2023)   [employer] of Occupational Health - Occupational Stress Questionnaire    Feeling of Stress : Not at all  Social Connections: Moderately Integrated (11/11/2023)   Social Connection and Isolation Panel [NHANES]    Frequency of Communication with Friends and Family: Three times a week    Frequency of Social Gatherings with Friends and Family: Twice a week    Attends Religious Services: More than 4 times per year    Active Member of [employer] or Organizations: No    Attends Engineer, structural: Never    Marital Status: Married    Tobacco Counseling Counseling given: Not Answered Tobacco comments: n/a    Clinical Intake:  Pre-visit preparation completed: Yes  Pain : No/denies pain     Nutritional Status: BMI 25 -29 Overweight Nutritional Risks: None Diabetes: Yes CBG done?: No Did pt. bring in CBG monitor from home?: No  Lab Results  Component Value Date   HGBA1C 5.8 (H) 10/28/2023   HGBA1C 8.5 (H) 07/09/2023   HGBA1C 6.9 (H) 03/10/2023     How often do you need to have someone help you when you read instructions, pamphlets, or other written materials from your doctor or pharmacy?: 1 - Never  Interpreter Needed?: No  Information entered by :: NAllen LPN   Activities of Daily Living     11/11/2023   10:49 AM 10/20/2023    9:23 AM  In your present state of health, do you have any difficulty performing the following activities:  Hearing? 0 0  Vision? 0 0  Difficulty concentrating or making decisions? 0 0  Walking or climbing stairs? 0   Dressing or bathing? 0   Doing errands, shopping? 0   Preparing Food and eating ? N   Using the Toilet? N   In the  past six months, have you accidently leaked urine? N   Do you have problems with loss of bowel control? N   Managing your Medications? N   Managing your Finances? N   Housekeeping or managing your Housekeeping? N     Patient Care Team:  [name], MD as PCP - General (Internal Medicine) [name], RN as Oncology Nurse Navigator  Indicate any recent Medical Services you may have received from other than [facility] providers in the past year (date may be approximate).     Assessment:    This is a routine wellness examination for William Mcguire.  Hearing/Vision screen Hearing Screening - Comments:: Denies hearing issues Vision Screening - Comments:: Regular eye exams,    Goals Addressed             This Visit's Progress    Patient Stated       11/11/2023, denies goals       Depression Screen     11/11/2023   10:53 AM 10/28/2023   10:03 AM 06/04/2023   11:00 AM 03/10/2023   11:33 AM 11/05/2022   11:05 AM 08/18/2022    2:24 PM 08/18/2022    2:18 PM  PHQ 2/9 Scores  PHQ - 2 Score 0 0 0 0 0 0 0  PHQ- 9 Score 1 0 0 0       Fall Risk     11/11/2023   10:53 AM 10/28/2023   10:03 AM 10/19/2023   12:22 PM 06/04/2023   11:00 AM 03/10/2023   11:33 AM  Fall Risk   Falls in the past year? 0 0 0 0 0  Number falls in past yr: 0 0 0 0 0  Injury with Fall? 0 0 0 0 0  Risk for fall due to : Medication side effect No Fall Risks No Fall Risks No Fall Risks No Fall Risks  Follow up Falls evaluation completed;Falls prevention discussed Falls evaluation completed Falls evaluation completed Falls evaluation completed Falls evaluation completed    MEDICARE RISK AT HOME:  Medicare Risk at Home Any stairs in or around the home?: Yes If so, are there any without handrails?: No Home free of loose throw rugs in walkways, pet beds, electrical cords, etc?: Yes Adequate lighting in your home to reduce risk of falls?: Yes Life alert?: No Use of a cane, walker or w/c?: No Grab bars in the  bathroom?: No Shower chair or bench in shower?: No Elevated toilet seat or a handicapped toilet?: No  TIMED UP AND GO:  Was the test performed?  Yes  Length of time to ambulate 10 feet: 5 sec Gait steady and fast without use of assistive device  Cognitive Function: 6CIT completed        11/11/2023   10:54 AM 11/05/2022   11:06 AM 10/30/2021   12:22 PM 11/30/2019    9:41 AM  6CIT Screen  What Year? 0 points 0 points 0 points 0 points  What month? 0 points 0 points 0 points 0 points  What time? 0 points 0 points 0 points 0 points  Count back from 20 0 points 0 points 0 points 0 points  Months in reverse 4 points 4 points 4 points 2 points  Repeat phrase 0 points 2 points 6 points 0 points  Total Score 4 points 6 points 10 points 2 points    Immunizations Immunization History  Administered Date(s) Administered   Fluad Quad(high Dose 65+) 04/03/2020, 06/04/2021, 08/18/2022   Fluad Trivalent(High Dose 65+) 03/10/2023   Influenza Split 06/12/2011   Influenza, High Dose Seasonal PF 03/22/2019   Influenza,inj,Quad PF,6+ Mos 05/05/2018   Influenza-Unspecified 03/22/2019   Moderna Sars-Covid-2 Vaccination 09/10/2019, 10/08/2019, 05/01/2020   PNEUMOCOCCAL CONJUGATE-20 10/07/2021   Pfizer Covid-19 Vaccine Bivalent Booster 3yrs & up 05/13/2021  Pneumococcal Polysaccharide-23 04/25/2019   Tdap 08/18/2022   Zoster Recombinant(Shingrix ) 11/05/2021, 03/31/2022    Screening Tests Health Maintenance  Topic Date Due   COVID-19 Vaccine (5 - 2024-25 season) 11/27/2023 (Originally 02/22/2023)   INFLUENZA VACCINE  01/22/2024   HEMOGLOBIN A1C  04/29/2024   OPHTHALMOLOGY EXAM  09/03/2024   Diabetic kidney evaluation - Urine ACR  10/27/2024   FOOT EXAM  10/27/2024   Diabetic kidney evaluation - eGFR measurement  11/09/2024   Medicare Annual Wellness (AWV)  11/10/2024   Colonoscopy  09/28/2028   DTaP/Tdap/Td (2 - Td or Tdap) 08/18/2032   Pneumonia Vaccine 79+ Years old  Completed    Hepatitis C Screening  Completed   Zoster Vaccines- Shingrix   Completed   HPV VACCINES  Aged Out   Meningococcal B Vaccine  Aged Out    Health Maintenance  There are no preventive care reminders to display for this patient.  Health Maintenance Items Addressed: Declines covid vaccine  Additional Screening:  Vision Screening: Recommended annual ophthalmology exams for early detection of glaucoma and other disorders of the eye.  Dental Screening: Recommended annual dental exams for proper oral hygiene  Community Resource Referral / Chronic Care Management: CRR required this visit?  No   CCM required this visit?  No   Plan:    I have personally reviewed and noted the following in the patient's chart:   Medical and social history Use of alcohol, tobacco or illicit drugs  Current medications and supplements including opioid prescriptions. Patient is not currently taking opioid prescriptions. Functional ability and status Nutritional status Physical activity Advanced directives List of other physicians Hospitalizations, surgeries, and ER visits in previous 12 months Vitals Screenings to include cognitive, depression, and falls Referrals and appointments  In addition, I have reviewed and discussed with patient certain preventive protocols, quality metrics, and best practice recommendations. A written personalized care plan for preventive services as well as general preventive health recommendations were provided to patient.   Areatha Beecham, LPN   1/61/0960   After Visit Summary: (In Person-Declined) Patient declined AVS at this time.  Notes: Nothing significant to report at this time.

## 2023-11-12 ENCOUNTER — Other Ambulatory Visit: Payer: Self-pay

## 2023-11-13 ENCOUNTER — Other Ambulatory Visit: Payer: Self-pay | Admitting: Internal Medicine

## 2023-11-13 LAB — MULTIPLE MYELOMA PANEL, SERUM
Albumin SerPl Elph-Mcnc: 3.5 g/dL (ref 2.9–4.4)
Albumin/Glob SerPl: 0.7 (ref 0.7–1.7)
Alpha 1: 0.3 g/dL (ref 0.0–0.4)
Alpha2 Glob SerPl Elph-Mcnc: 1.1 g/dL — ABNORMAL HIGH (ref 0.4–1.0)
B-Globulin SerPl Elph-Mcnc: 1 g/dL (ref 0.7–1.3)
Gamma Glob SerPl Elph-Mcnc: 3.1 g/dL — ABNORMAL HIGH (ref 0.4–1.8)
Globulin, Total: 5.6 g/dL — ABNORMAL HIGH (ref 2.2–3.9)
IgA: 83 mg/dL (ref 61–437)
IgG (Immunoglobin G), Serum: 4518 mg/dL — ABNORMAL HIGH (ref 603–1613)
IgM (Immunoglobulin M), Srm: 35 mg/dL (ref 20–172)
M Protein SerPl Elph-Mcnc: 2.8 g/dL — ABNORMAL HIGH
Total Protein ELP: 9.1 g/dL — ABNORMAL HIGH (ref 6.0–8.5)

## 2023-11-13 MED ORDER — SILDENAFIL CITRATE 100 MG PO TABS: 100.0000 mg | ORAL_TABLET | ORAL | 2 refills | Status: AC | PRN

## 2023-11-16 DIAGNOSIS — D539 Nutritional anemia, unspecified: Secondary | ICD-10-CM | POA: Diagnosis not present

## 2023-11-16 DIAGNOSIS — Z86711 Personal history of pulmonary embolism: Secondary | ICD-10-CM | POA: Diagnosis not present

## 2023-11-16 DIAGNOSIS — D61818 Other pancytopenia: Secondary | ICD-10-CM | POA: Diagnosis not present

## 2023-11-16 DIAGNOSIS — C9000 Multiple myeloma not having achieved remission: Secondary | ICD-10-CM | POA: Diagnosis not present

## 2023-11-16 DIAGNOSIS — E1122 Type 2 diabetes mellitus with diabetic chronic kidney disease: Secondary | ICD-10-CM | POA: Diagnosis not present

## 2023-11-16 DIAGNOSIS — M25421 Effusion, right elbow: Secondary | ICD-10-CM | POA: Diagnosis not present

## 2023-11-16 DIAGNOSIS — Z902 Acquired absence of lung [part of]: Secondary | ICD-10-CM | POA: Diagnosis not present

## 2023-11-16 DIAGNOSIS — C3411 Malignant neoplasm of upper lobe, right bronchus or lung: Secondary | ICD-10-CM | POA: Diagnosis not present

## 2023-11-16 DIAGNOSIS — Z7901 Long term (current) use of anticoagulants: Secondary | ICD-10-CM | POA: Diagnosis not present

## 2023-11-16 DIAGNOSIS — Z5111 Encounter for antineoplastic chemotherapy: Secondary | ICD-10-CM | POA: Diagnosis not present

## 2023-11-16 DIAGNOSIS — N1831 Chronic kidney disease, stage 3a: Secondary | ICD-10-CM | POA: Diagnosis not present

## 2023-11-16 DIAGNOSIS — Z5112 Encounter for antineoplastic immunotherapy: Secondary | ICD-10-CM | POA: Diagnosis not present

## 2023-11-16 DIAGNOSIS — Z86718 Personal history of other venous thrombosis and embolism: Secondary | ICD-10-CM | POA: Diagnosis not present

## 2023-11-17 ENCOUNTER — Inpatient Hospital Stay: Admitting: Hematology and Oncology

## 2023-11-17 ENCOUNTER — Inpatient Hospital Stay

## 2023-11-17 ENCOUNTER — Encounter: Payer: Self-pay | Admitting: Hematology and Oncology

## 2023-11-17 VITALS — BP 146/77 | HR 77 | Temp 97.7°F | Resp 18 | Ht 75.0 in | Wt 226.2 lb

## 2023-11-17 DIAGNOSIS — Z86711 Personal history of pulmonary embolism: Secondary | ICD-10-CM | POA: Diagnosis not present

## 2023-11-17 DIAGNOSIS — E1122 Type 2 diabetes mellitus with diabetic chronic kidney disease: Secondary | ICD-10-CM

## 2023-11-17 DIAGNOSIS — N1831 Chronic kidney disease, stage 3a: Secondary | ICD-10-CM | POA: Diagnosis not present

## 2023-11-17 DIAGNOSIS — I2694 Multiple subsegmental pulmonary emboli without acute cor pulmonale: Secondary | ICD-10-CM | POA: Diagnosis not present

## 2023-11-17 DIAGNOSIS — C3411 Malignant neoplasm of upper lobe, right bronchus or lung: Secondary | ICD-10-CM | POA: Diagnosis not present

## 2023-11-17 DIAGNOSIS — N183 Chronic kidney disease, stage 3 unspecified: Secondary | ICD-10-CM

## 2023-11-17 DIAGNOSIS — Z86718 Personal history of other venous thrombosis and embolism: Secondary | ICD-10-CM | POA: Diagnosis not present

## 2023-11-17 DIAGNOSIS — Z5112 Encounter for antineoplastic immunotherapy: Secondary | ICD-10-CM | POA: Diagnosis not present

## 2023-11-17 DIAGNOSIS — C9000 Multiple myeloma not having achieved remission: Secondary | ICD-10-CM | POA: Diagnosis not present

## 2023-11-17 DIAGNOSIS — M25421 Effusion, right elbow: Secondary | ICD-10-CM | POA: Diagnosis not present

## 2023-11-17 DIAGNOSIS — D61818 Other pancytopenia: Secondary | ICD-10-CM | POA: Diagnosis not present

## 2023-11-17 DIAGNOSIS — Z794 Long term (current) use of insulin: Secondary | ICD-10-CM

## 2023-11-17 DIAGNOSIS — Z902 Acquired absence of lung [part of]: Secondary | ICD-10-CM | POA: Diagnosis not present

## 2023-11-17 DIAGNOSIS — Z7901 Long term (current) use of anticoagulants: Secondary | ICD-10-CM | POA: Diagnosis not present

## 2023-11-17 DIAGNOSIS — Z5111 Encounter for antineoplastic chemotherapy: Secondary | ICD-10-CM | POA: Diagnosis not present

## 2023-11-17 DIAGNOSIS — D539 Nutritional anemia, unspecified: Secondary | ICD-10-CM | POA: Diagnosis not present

## 2023-11-17 LAB — CMP (CANCER CENTER ONLY)
ALT: 15 U/L (ref 0–44)
AST: 18 U/L (ref 15–41)
Albumin: 3.6 g/dL (ref 3.5–5.0)
Alkaline Phosphatase: 109 U/L (ref 38–126)
Anion gap: 6 (ref 5–15)
BUN: 20 mg/dL (ref 8–23)
CO2: 26 mmol/L (ref 22–32)
Calcium: 9.3 mg/dL (ref 8.9–10.3)
Chloride: 104 mmol/L (ref 98–111)
Creatinine: 1.39 mg/dL — ABNORMAL HIGH (ref 0.61–1.24)
GFR, Estimated: 55 mL/min — ABNORMAL LOW (ref >60)
Glucose, Bld: 100 mg/dL — ABNORMAL HIGH (ref 70–99)
Potassium: 4 mmol/L (ref 3.5–5.1)
Sodium: 136 mmol/L (ref 135–145)
Total Bilirubin: 0.3 mg/dL (ref 0.0–1.2)
Total Protein: 8.2 g/dL — ABNORMAL HIGH (ref 6.5–8.1)

## 2023-11-17 LAB — CBC WITH DIFFERENTIAL (CANCER CENTER ONLY)
Abs Immature Granulocytes: 0.01 10*3/uL (ref 0.00–0.07)
Basophils Absolute: 0 10*3/uL (ref 0.0–0.1)
Basophils Relative: 0 %
Eosinophils Absolute: 0.1 10*3/uL (ref 0.0–0.5)
Eosinophils Relative: 3 %
HCT: 27.3 % — ABNORMAL LOW (ref 39.0–52.0)
Hemoglobin: 9.1 g/dL — ABNORMAL LOW (ref 13.0–17.0)
Immature Granulocytes: 0 %
Lymphocytes Relative: 22 %
Lymphs Abs: 0.8 10*3/uL (ref 0.7–4.0)
MCH: 29.7 pg (ref 26.0–34.0)
MCHC: 33.3 g/dL (ref 30.0–36.0)
MCV: 89.2 fL (ref 80.0–100.0)
Monocytes Absolute: 0.6 10*3/uL (ref 0.1–1.0)
Monocytes Relative: 16 %
Neutro Abs: 2.1 10*3/uL (ref 1.7–7.7)
Neutrophils Relative %: 59 %
Platelet Count: 158 10*3/uL (ref 150–400)
RBC: 3.06 MIL/uL — ABNORMAL LOW (ref 4.22–5.81)
RDW: 13.5 % (ref 11.5–15.5)
WBC Count: 3.5 10*3/uL — ABNORMAL LOW (ref 4.0–10.5)
nRBC: 0 % (ref 0.0–0.2)

## 2023-11-17 NOTE — Progress Notes (Signed)
 [facility] OFFICE PROGRESS NOTE  Patient Care Team: [name], MD as PCP - General (Internal Medicine) [name], RN as Oncology Nurse Navigator  Assessment & Plan Multiple myeloma not having achieved remission [facility]) The patient have prior diagnosis of smoldering myeloma.  He was referred to Dr. 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 PET/CT imaging was reviewed with the patient and his wife which show no evidence of significant bone involvement/fractures  The patient received cycle 1 of treatment last week without major complications except for 1 episode of low-grade fever the day after which resolved spontaneously and very high blood sugar that resolved with insulin  I reviewed recent myeloma panel that we drew a week ago which serve as his new baseline and plan to repeat myeloma panel monthly while on treatment to assess response to therapy I reminded him to take dexamethasone  before treatment each week I warned him about risk of severe hyperglycemia due to his history of diabetes He had dental clearance recently and I will start him on Zometa tomorrow, and then every 12 weeks We discussed the role of allopurinol  for tumor lysis prophylaxis and acyclovir  for antimicrobial prophylaxis Type 2 diabetes mellitus with stage 3 chronic kidney disease, with long-term current use of insulin , unspecified whether stage 3a or 3b CKD (HCC) He will continue vigilant blood sugar monitoring and adjustment to diet due to high risk of hyperglycemia while on treatment Multiple subsegmental pulmonary emboli without acute cor pulmonale (HCC) He has significant history of DVT and bilateral PE of  moderate clot burden to the point he needed thrombectomy He is doing well on anticoagulation therapy Given his current diagnosis of multiple myeloma, I recommend he continue anticoagulation therapy for a longer period of time than the 6 months that he was told Due to his history of significant clot burden, I am reluctant to add lenalidomide to his treatment right now Pancytopenia, acquired (HCC) The cause of his pancytopenia is multifactorial, likely due to side effects of chemotherapy There is also component of anemia of chronic kidney disease as well as B12 deficiency Will continue close monitoring Will proceed with treatment without delay CKD stage 3a, GFR 45-59 ml/min (HCC) Discussed importance of adequate fluid hydration  No orders of the defined types were placed in this encounter.    [name], MD  INTERVAL HISTORY: he returns for treatment follow-up Complications related to previous cycle of chemotherapy included pancytopenia, 1 episode of low-grade fever of 100 and high blood sugar 4-day after treatment Denies bone pain We discussed recent test results and reinforced the importance of taking dexamethasone  prior to treatment  PHYSICAL EXAMINATION: ECOG PERFORMANCE STATUS: 1 - Symptomatic but completely ambulatory  No results found for: "CAN125"    Latest Ref Rng & Units 11/17/2023   10:16 AM 11/10/2023    8:02 AM 10/26/2023    9:44 AM  CBC  WBC 4.0 - 10.5 K/uL 3.5  6.4  6.0   Hemoglobin 13.0 - 17.0 g/dL 9.1  9.5  9.3   Hematocrit 39.0 - 52.0 % 27.3  28.2  27.9   Platelets 150 - 400 K/uL 158  196  226       Chemistry  Component Value Date/Time   NA 136 11/17/2023 1016   NA 137 11/05/2022 1152   K 4.0 11/17/2023 1016   CL 104 11/17/2023 1016   CO2 26 11/17/2023 1016   BUN 20 11/17/2023 1016   BUN 11 11/05/2022 1152   CREATININE 1.39 (H) 11/17/2023 1016      Component Value Date/Time   CALCIUM  9.3 11/17/2023 1016   ALKPHOS 109 11/17/2023 1016   AST 18  11/17/2023 1016   ALT 15 11/17/2023 1016   BILITOT 0.3 11/17/2023 1016       Vitals:   11/17/23 1027  BP: (!) 146/77  Pulse: 77  Resp: 18  Temp: 97.7 F (36.5 C)  SpO2: 100%   Filed Weights   11/17/23 1027  Weight: 226 lb 3.2 oz (102.6 kg)   Other relevant data reviewed during this visit included CBC, CMP and myeloma panel

## 2023-11-17 NOTE — Assessment & Plan Note (Addendum)
 The cause of his pancytopenia is multifactorial, likely due to side effects of chemotherapy There is also component of anemia of chronic kidney disease as well as B12 deficiency Will continue close monitoring Will proceed with treatment without delay

## 2023-11-17 NOTE — Assessment & Plan Note (Addendum)
 The patient have prior diagnosis of smoldering myeloma.  He was referred to Dr. 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 PET/CT imaging was reviewed with the patient and his wife which show no evidence of significant bone involvement/fractures  The patient received cycle 1 of treatment last week without major complications except for 1 episode of low-grade fever the day after which resolved spontaneously and very high blood sugar that resolved with insulin  I reviewed recent myeloma panel that we drew a week ago which serve as his new baseline and plan to repeat myeloma panel monthly while on treatment to assess response to therapy I reminded him to take dexamethasone  before treatment each week I warned him about risk of severe hyperglycemia due to his history of diabetes He had dental clearance recently and I will start him on Zometa tomorrow, and then every 12 weeks We discussed the role of allopurinol  for tumor lysis prophylaxis and acyclovir  for antimicrobial prophylaxis

## 2023-11-17 NOTE — Assessment & Plan Note (Addendum)
 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

## 2023-11-17 NOTE — Assessment & Plan Note (Addendum)
 Discussed importance of adequate fluid hydration

## 2023-11-17 NOTE — Assessment & Plan Note (Addendum)
 He will continue vigilant blood sugar monitoring and adjustment to diet due to high risk of hyperglycemia while on treatment

## 2023-11-18 ENCOUNTER — Other Ambulatory Visit: Payer: Self-pay | Admitting: Hematology and Oncology

## 2023-11-18 ENCOUNTER — Inpatient Hospital Stay

## 2023-11-18 VITALS — BP 134/78 | HR 76 | Temp 97.9°F | Resp 18

## 2023-11-18 DIAGNOSIS — C9000 Multiple myeloma not having achieved remission: Secondary | ICD-10-CM | POA: Diagnosis not present

## 2023-11-18 DIAGNOSIS — Z5112 Encounter for antineoplastic immunotherapy: Secondary | ICD-10-CM | POA: Diagnosis not present

## 2023-11-18 DIAGNOSIS — D61818 Other pancytopenia: Secondary | ICD-10-CM | POA: Diagnosis not present

## 2023-11-18 DIAGNOSIS — Z7901 Long term (current) use of anticoagulants: Secondary | ICD-10-CM | POA: Diagnosis not present

## 2023-11-18 DIAGNOSIS — D539 Nutritional anemia, unspecified: Secondary | ICD-10-CM | POA: Diagnosis not present

## 2023-11-18 DIAGNOSIS — E1122 Type 2 diabetes mellitus with diabetic chronic kidney disease: Secondary | ICD-10-CM | POA: Diagnosis not present

## 2023-11-18 DIAGNOSIS — M25421 Effusion, right elbow: Secondary | ICD-10-CM | POA: Diagnosis not present

## 2023-11-18 DIAGNOSIS — Z5111 Encounter for antineoplastic chemotherapy: Secondary | ICD-10-CM | POA: Diagnosis not present

## 2023-11-18 DIAGNOSIS — Z86718 Personal history of other venous thrombosis and embolism: Secondary | ICD-10-CM | POA: Diagnosis not present

## 2023-11-18 DIAGNOSIS — C3411 Malignant neoplasm of upper lobe, right bronchus or lung: Secondary | ICD-10-CM | POA: Diagnosis not present

## 2023-11-18 DIAGNOSIS — N1831 Chronic kidney disease, stage 3a: Secondary | ICD-10-CM | POA: Diagnosis not present

## 2023-11-18 DIAGNOSIS — Z86711 Personal history of pulmonary embolism: Secondary | ICD-10-CM | POA: Diagnosis not present

## 2023-11-18 DIAGNOSIS — Z902 Acquired absence of lung [part of]: Secondary | ICD-10-CM | POA: Diagnosis not present

## 2023-11-18 MED ORDER — ACETAMINOPHEN 325 MG PO TABS
650.0000 mg | ORAL_TABLET | Freq: Once | ORAL | Status: AC
  Administered 2023-11-18: 650 mg via ORAL
  Filled 2023-11-18: qty 2

## 2023-11-18 MED ORDER — ZOLEDRONIC ACID 4 MG/100ML IV SOLN
4.0000 mg | Freq: Once | INTRAVENOUS | Status: AC
  Administered 2023-11-18: 4 mg via INTRAVENOUS
  Filled 2023-11-18: qty 100

## 2023-11-18 MED ORDER — BORTEZOMIB CHEMO SQ INJECTION 3.5 MG (2.5MG/ML)
1.3000 mg/m2 | Freq: Once | INTRAMUSCULAR | Status: AC
  Administered 2023-11-18: 3 mg via SUBCUTANEOUS
  Filled 2023-11-18: qty 1.2

## 2023-11-18 MED ORDER — DIPHENHYDRAMINE HCL 25 MG PO CAPS
50.0000 mg | ORAL_CAPSULE | Freq: Once | ORAL | Status: AC
  Administered 2023-11-18: 50 mg via ORAL
  Filled 2023-11-18: qty 2

## 2023-11-18 MED ORDER — MONTELUKAST SODIUM 10 MG PO TABS
10.0000 mg | ORAL_TABLET | Freq: Once | ORAL | Status: AC
  Administered 2023-11-18: 10 mg via ORAL
  Filled 2023-11-18: qty 1

## 2023-11-18 MED ORDER — SODIUM CHLORIDE 0.9 % IV SOLN: INTRAVENOUS | Status: DC

## 2023-11-18 MED ORDER — DARATUMUMAB-HYALURONIDASE-FIHJ 1800-30000 MG-UT/15ML ~~LOC~~ SOLN
1800.0000 mg | Freq: Once | SUBCUTANEOUS | Status: AC
  Administered 2023-11-18: 1800 mg via SUBCUTANEOUS
  Filled 2023-11-18: qty 15

## 2023-11-18 NOTE — Patient Instructions (Signed)
 [facility] - A DEPT OF [facility]  Discharge Instructions: Thank you for choosing [facility] to provide your oncology and hematology care.   If you have a lab appointment with the Cancer Center, please go directly to the Cancer Center and check in at the registration area.   Wear comfortable clothing and clothing appropriate for easy access to any Portacath or PICC line.   We strive to give you quality time with your provider. You may need to reschedule your appointment if you arrive late (15 or more minutes).  Arriving late affects you and other patients whose appointments are after yours.  Also, if you miss three or more appointments without notifying the office, you may be dismissed from the clinic at the provider's discretion.      For prescription refill requests, have your pharmacy contact our office and allow 72 hours for refills to be completed.    Today you received the following chemotherapy and/or immunotherapy agents: Velcade and Darzalex Faspro      To help prevent nausea and vomiting after your treatment, we encourage you to take your nausea medication as directed.  BELOW ARE SYMPTOMS THAT SHOULD BE REPORTED IMMEDIATELY: *FEVER GREATER THAN 100.4 F (38 C) OR HIGHER *CHILLS OR SWEATING *NAUSEA AND VOMITING THAT IS NOT CONTROLLED WITH YOUR NAUSEA MEDICATION *UNUSUAL SHORTNESS OF BREATH *UNUSUAL BRUISING OR BLEEDING *URINARY PROBLEMS (pain or burning when urinating, or frequent urination) *BOWEL PROBLEMS (unusual diarrhea, constipation, pain near the anus) TENDERNESS IN MOUTH AND THROAT WITH OR WITHOUT PRESENCE OF ULCERS (sore throat, sores in mouth, or a toothache) UNUSUAL RASH, SWELLING OR PAIN  UNUSUAL VAGINAL DISCHARGE OR ITCHING   Items with * indicate a potential emergency and should be followed up as soon as possible or go to the Emergency Department if any problems should occur.  Please show the CHEMOTHERAPY ALERT CARD  or IMMUNOTHERAPY ALERT CARD at check-in to the Emergency Department and triage nurse.  Should you have questions after your visit or need to cancel or reschedule your appointment, please contact [facility] - A DEPT OF [facility]  Dept: [phone]  and follow the prompts.  Office hours are 8:00 a.m. to 4:30 p.m. Monday - Friday. Please note that voicemails left after 4:00 p.m. may not be returned until the following business day.  We are closed weekends and major holidays. You have access to a nurse at all times for urgent questions. Please call the main number to the clinic Dept: [phone] and follow the prompts.   For any non-urgent questions, you may also contact your provider using MyChart. We now offer e-Visits for anyone 45 and older to request care online for non-urgent symptoms. For details visit [email or web address].   Also download the MyChart app! Go to the app store, search "MyChart", open the app, select [facility], and log in with your MyChart username and password.  Bortezomib Injection  What is this medication? BORTEZOMIB (bor TEZ oh mib) treats lymphoma. It may also be used to treat multiple myeloma, a type of bone marrow cancer. It works by blocking a protein that causes cancer cells to grow and multiply. This helps to slow or stop the spread of cancer cells. This medicine may be used for other purposes; ask your health care provider or pharmacist if you have questions. COMMON BRAND NAME(S): BORUZU, Velcade What should I tell my care team before I take this  medication? They need to know if you have any of these conditions: Dehydration Diabetes Heart disease Liver disease Tingling of the fingers or toes or other nerve disorder An unusual or allergic reaction to bortezomib, other medications, foods, dyes, or preservatives If you or your partner are pregnant or trying to get pregnant Breastfeeding How should I use this  medication? This medication is injected into a vein or under the skin. It is given by your care team in a hospital or clinic setting. Talk to your care team about the use of this medication in children. Special care may be needed. Overdosage: If you think you have taken too much of this medicine contact a poison control center or emergency room at once. NOTE: This medicine is only for you. Do not share this medicine with others. What if I miss a dose? Keep appointments for follow-up doses. It is important not to miss your dose. Call your care team if you are unable to keep an appointment. What may interact with this medication? Ketoconazole Rifampin This list may not describe all possible interactions. Give your health care provider a list of all the medicines, herbs, non-prescription drugs, or dietary supplements you use. Also tell them if you smoke, drink alcohol, or use illegal drugs. Some items may interact with your medicine. What should I watch for while using this medication? Your condition will be monitored carefully while you are receiving this medication. You may need blood work while taking this medication. This medication may affect your coordination, reaction time, or judgment. Do not drive or operate machinery until you know how this medication affects you. Sit up or stand slowly to reduce the risk of dizzy or fainting spells. Drinking alcohol with this medication can increase the risk of these side effects. This medication may increase your risk of getting an infection. Call your care team for advice if you get a fever, chills, sore throat, or other symptoms of a cold or flu. Do not treat yourself. Try to avoid being around people who are sick. Check with your care team if you have severe diarrhea, nausea, and vomiting, or if you sweat a lot. The loss of too much body fluid may make it dangerous for you to take this medication. Talk to your care team if you may be pregnant. Serious  birth defects can occur if you take this medication during pregnancy and for 7 months after the last dose. You will need a negative pregnancy test before starting this medication. Contraception is recommended while taking this medication and for 7 months after the last dose. Your care team can help you find the option that works for you. If your partner can get pregnant, use a condom during sex while taking this medication and for 4 months after the last dose. Do not breastfeed while taking this medication and for 2 months after the last dose. This medication may cause infertility. Talk to your care team if you are concerned about your fertility. What side effects may I notice from receiving this medication? Side effects that you should report to your care team as soon as possible: Allergic reactions--skin rash, itching, hives, swelling of the face, lips, tongue, or throat Bleeding--bloody or black, tar-like stools, vomiting blood or brown material that looks like coffee grounds, red or dark brown urine, small red or purple spots on skin, unusual bruising or bleeding Bleeding in the brain--severe headache, stiff neck, confusion, dizziness, change in vision, numbness or weakness of the face, arm, or leg,  trouble speaking, trouble walking, vomiting Bowel blockage--stomach cramping, unable to have a bowel movement or pass gas, loss of appetite, vomiting Heart failure--shortness of breath, swelling of the ankles, feet, or hands, sudden weight gain, unusual weakness or fatigue Infection--fever, chills, cough, sore throat, wounds that don't heal, pain or trouble when passing urine, general feeling of discomfort or being unwell Liver injury--right upper belly pain, loss of appetite, nausea, light-colored stool, dark yellow or brown urine, yellowing skin or eyes, unusual weakness or fatigue Low blood pressure--dizziness, feeling faint or lightheaded, blurry vision Lung injury--shortness of breath or trouble  breathing, cough, spitting up blood, chest pain, fever Pain, tingling, or numbness in the hands or feet Severe or prolonged diarrhea Stomach pain, bloody diarrhea, pale skin, unusual weakness or fatigue, decrease in the amount of urine, which may be signs of hemolytic uremic syndrome Sudden and severe headache, confusion, change in vision, seizures, which may be signs of posterior reversible encephalopathy syndrome (PRES) TTP--purple spots on the skin or inside the mouth, pale skin, yellowing skin or eyes, unusual weakness or fatigue, fever, fast or irregular heartbeat, confusion, change in vision, trouble speaking, trouble walking Tumor lysis syndrome (TLS)--nausea, vomiting, diarrhea, decrease in the amount of urine, dark urine, unusual weakness or fatigue, confusion, muscle pain or cramps, fast or irregular heartbeat, joint pain Side effects that usually do not require medical attention (report to your care team if they continue or are bothersome): Constipation Diarrhea Fatigue Loss of appetite Nausea This list may not describe all possible side effects. Call your doctor for medical advice about side effects. You may report side effects to FDA at 1-800-FDA-1088. Where should I keep my medication? This medication is given in a hospital or clinic. It will not be stored at home. NOTE: This sheet is a summary. It may not cover all possible information. If you have questions about this medicine, talk to your doctor, pharmacist, or health care provider.  2024 Elsevier/Gold Standard (2021-11-12 00:00:00)  Daratumumab Injection  What is this medication? DARATUMUMAB (dar a toom ue mab) treats multiple myeloma, a type of bone marrow cancer. It works by helping your immune system slow or stop the spread of cancer cells. It is a monoclonal antibody. This medicine may be used for other purposes; ask your health care provider or pharmacist if you have questions. COMMON BRAND NAME(S): DARZALEX What  should I tell my care team before I take this medication? They need to know if you have any of these conditions: Hereditary fructose intolerance Infection, such as chickenpox, herpes, hepatitis B Lung or breathing disease, such as asthma, COPD An unusual or allergic reaction to daratumumab, sorbitol, other medications, foods, dyes, or preservatives Pregnant or trying to get pregnant Breastfeeding How should I use this medication? This medication is injected into a vein. It is given by your care team in a hospital or clinic setting. Talk to your care team about the use of this medication in children. Special care may be needed. Overdosage: If you think you have taken too much of this medicine contact a poison control center or emergency room at once. NOTE: This medicine is only for you. Do not share this medicine with others. What if I miss a dose? Keep appointments for follow-up doses. It is important not to miss your dose. Call your care team if you are unable to keep an appointment. What may interact with this medication? Interactions have not been studied. This list may not describe all possible interactions. Give your health care  provider a list of all the medicines, herbs, non-prescription drugs, or dietary supplements you use. Also tell them if you smoke, drink alcohol, or use illegal drugs. Some items may interact with your medicine. What should I watch for while using this medication? Your condition will be monitored carefully while you are receiving this medication. This medication can cause serious allergic reactions. To reduce your risk, your care team may give you other medication to take before receiving this one. Be sure to follow the directions from your care team. This medication can affect the results of blood tests to match your blood type. These changes can last for up to 6 months after the final dose. Your care team will do blood tests to match your blood type before you  start treatment. Tell all of your care team that you are being treated with this medication before receiving a blood transfusion. This medication can affect the results of some tests used to determine treatment response; extra tests may be needed to evaluate response. Talk to your care team if you wish to become pregnant or think you are pregnant. This medication can cause serious birth defects if taken during pregnancy and for 3 months after the last dose. A reliable form of contraception is recommended while taking this medication and for 3 months after the last dose. Talk to your care team about effective forms of contraception. Do not breast-feed while taking this medication. What side effects may I notice from receiving this medication? Side effects that you should report to your care team as soon as possible: Allergic reactions--skin rash, itching, hives, swelling of the face, lips, tongue, or throat Infection--fever, chills, cough, sore throat, wounds that don't heal, pain or trouble when passing urine, general feeling of discomfort or being unwell Infusion reactions--chest pain, shortness of breath or trouble breathing, feeling faint or lightheaded Unusual bruising or bleeding Side effects that usually do not require medical attention (report to your care team if they continue or are bothersome): Constipation Diarrhea Fatigue Nausea Pain, tingling, or numbness in the hands or feet Swelling of the ankles, hands, or feet This list may not describe all possible side effects. Call your doctor for medical advice about side effects. You may report side effects to FDA at 1-800-FDA-1088. Where should I keep my medication? This medication is given in a hospital or clinic. It will not be stored at home. NOTE: This sheet is a summary. It may not cover all possible information. If you have questions about this medicine, talk to your doctor, pharmacist, or health care provider.  2024 Elsevier/Gold  Standard (2022-04-17 00:00:00)

## 2023-11-18 NOTE — Progress Notes (Signed)
 Patient states he took Dexamethasone  prior to appointment today.   Pt observed for 60 minutes post Darzalex Faspro injection. Pt tolerated Tx well w/out incident. VSS at discharge.  Ambulatory to lobby.

## 2023-11-19 LAB — UPEP/UIFE/LIGHT CHAINS/TP, 24-HR UR
% BETA, Urine: 29.8 %
ALPHA 1 URINE: 5.7 %
Albumin, U: 21.9 %
Alpha 2, Urine: 23.6 %
Free Kappa Lt Chains,Ur: 19.95 mg/L (ref 1.17–86.46)
Free Kappa/Lambda Ratio: 5.8 (ref 1.83–14.26)
Free Lambda Lt Chains,Ur: 3.44 mg/L (ref 0.27–15.21)
GAMMA GLOBULIN URINE: 19 %
Total Protein, Urine-Ur/day: 137 mg/(24.h) (ref 30–150)
Total Protein, Urine: 6.5 mg/dL
Total Volume: 2100

## 2023-11-20 ENCOUNTER — Telehealth: Payer: Self-pay

## 2023-11-20 NOTE — Telephone Encounter (Signed)
 Returned her call. Paulo started having all over muscle pain yesterday. He has tried applying heat to see if it helps.  Yesterday he started having hiccups and complaining of middle abdominal pain. Denies pain at injection sites. Denies constipation. He is able eat and drink, but with abdominal discomfort not as much. Per wife, she thinks the muscle pain is better today. Instructed to take tylenol  prn for pain. Wife verbalized understanding.  Wife is asking what Dr. Marton Sleeper recommends?

## 2023-11-20 NOTE — Telephone Encounter (Signed)
 Aches: tylenol  1000 mg prn for next 2-3 days. The aches is from zometa , make sure he is taking ca and vitamin D  Hiccups from reflux/steroids, take tums prn and ok to get pepcid AC

## 2023-11-20 NOTE — Telephone Encounter (Signed)
Called and given below message to wife. She verbalized understanding. 

## 2023-11-23 ENCOUNTER — Other Ambulatory Visit

## 2023-11-24 ENCOUNTER — Ambulatory Visit: Admitting: Hematology and Oncology

## 2023-11-24 ENCOUNTER — Inpatient Hospital Stay

## 2023-11-24 ENCOUNTER — Inpatient Hospital Stay: Attending: Hematology and Oncology

## 2023-11-24 VITALS — BP 128/63 | HR 74 | Temp 97.8°F | Resp 18 | Wt 223.0 lb

## 2023-11-24 DIAGNOSIS — Z86711 Personal history of pulmonary embolism: Secondary | ICD-10-CM | POA: Diagnosis not present

## 2023-11-24 DIAGNOSIS — K219 Gastro-esophageal reflux disease without esophagitis: Secondary | ICD-10-CM | POA: Insufficient documentation

## 2023-11-24 DIAGNOSIS — Z5112 Encounter for antineoplastic immunotherapy: Secondary | ICD-10-CM | POA: Insufficient documentation

## 2023-11-24 DIAGNOSIS — Z5111 Encounter for antineoplastic chemotherapy: Secondary | ICD-10-CM | POA: Diagnosis not present

## 2023-11-24 DIAGNOSIS — Z86718 Personal history of other venous thrombosis and embolism: Secondary | ICD-10-CM | POA: Diagnosis not present

## 2023-11-24 DIAGNOSIS — D539 Nutritional anemia, unspecified: Secondary | ICD-10-CM | POA: Insufficient documentation

## 2023-11-24 DIAGNOSIS — Z7901 Long term (current) use of anticoagulants: Secondary | ICD-10-CM | POA: Insufficient documentation

## 2023-11-24 DIAGNOSIS — C9000 Multiple myeloma not having achieved remission: Secondary | ICD-10-CM

## 2023-11-24 DIAGNOSIS — N1831 Chronic kidney disease, stage 3a: Secondary | ICD-10-CM | POA: Insufficient documentation

## 2023-11-24 LAB — CBC WITH DIFFERENTIAL (CANCER CENTER ONLY)
Abs Immature Granulocytes: 0.02 10*3/uL (ref 0.00–0.07)
Basophils Absolute: 0 10*3/uL (ref 0.0–0.1)
Basophils Relative: 0 %
Eosinophils Absolute: 0 10*3/uL (ref 0.0–0.5)
Eosinophils Relative: 0 %
HCT: 26.2 % — ABNORMAL LOW (ref 39.0–52.0)
Hemoglobin: 8.8 g/dL — ABNORMAL LOW (ref 13.0–17.0)
Immature Granulocytes: 0 %
Lymphocytes Relative: 6 %
Lymphs Abs: 0.3 10*3/uL — ABNORMAL LOW (ref 0.7–4.0)
MCH: 30 pg (ref 26.0–34.0)
MCHC: 33.6 g/dL (ref 30.0–36.0)
MCV: 89.4 fL (ref 80.0–100.0)
Monocytes Absolute: 0.2 10*3/uL (ref 0.1–1.0)
Monocytes Relative: 3 %
Neutro Abs: 5 10*3/uL (ref 1.7–7.7)
Neutrophils Relative %: 91 %
Platelet Count: 191 10*3/uL (ref 150–400)
RBC: 2.93 MIL/uL — ABNORMAL LOW (ref 4.22–5.81)
RDW: 14.1 % (ref 11.5–15.5)
WBC Count: 5.5 10*3/uL (ref 4.0–10.5)
nRBC: 0 % (ref 0.0–0.2)

## 2023-11-24 LAB — CMP (CANCER CENTER ONLY)
ALT: 15 U/L (ref 0–44)
AST: 19 U/L (ref 15–41)
Albumin: 3.5 g/dL (ref 3.5–5.0)
Alkaline Phosphatase: 114 U/L (ref 38–126)
Anion gap: 3 — ABNORMAL LOW (ref 5–15)
BUN: 12 mg/dL (ref 8–23)
CO2: 25 mmol/L (ref 22–32)
Calcium: 8.2 mg/dL — ABNORMAL LOW (ref 8.9–10.3)
Chloride: 108 mmol/L (ref 98–111)
Creatinine: 1.17 mg/dL (ref 0.61–1.24)
GFR, Estimated: >60 mL/min (ref >60)
Glucose, Bld: 184 mg/dL — ABNORMAL HIGH (ref 70–99)
Potassium: 4.4 mmol/L (ref 3.5–5.1)
Sodium: 136 mmol/L (ref 135–145)
Total Bilirubin: 0.3 mg/dL (ref 0.0–1.2)
Total Protein: 7.8 g/dL (ref 6.5–8.1)

## 2023-11-24 MED ORDER — MONTELUKAST SODIUM 10 MG PO TABS
10.0000 mg | ORAL_TABLET | Freq: Once | ORAL | Status: AC
  Administered 2023-11-24: 10 mg via ORAL
  Filled 2023-11-24: qty 1

## 2023-11-24 MED ORDER — BORTEZOMIB CHEMO SQ INJECTION 3.5 MG (2.5MG/ML)
1.3000 mg/m2 | Freq: Once | INTRAMUSCULAR | Status: AC
  Administered 2023-11-24: 3 mg via SUBCUTANEOUS
  Filled 2023-11-24: qty 1.2

## 2023-11-24 MED ORDER — DIPHENHYDRAMINE HCL 25 MG PO CAPS
50.0000 mg | ORAL_CAPSULE | Freq: Once | ORAL | Status: AC
  Administered 2023-11-24: 50 mg via ORAL
  Filled 2023-11-24: qty 2

## 2023-11-24 MED ORDER — DARATUMUMAB-HYALURONIDASE-FIHJ 1800-30000 MG-UT/15ML ~~LOC~~ SOLN
1800.0000 mg | Freq: Once | SUBCUTANEOUS | Status: AC
  Administered 2023-11-24: 1800 mg via SUBCUTANEOUS
  Filled 2023-11-24: qty 15

## 2023-11-24 MED ORDER — ACETAMINOPHEN 325 MG PO TABS
650.0000 mg | ORAL_TABLET | Freq: Once | ORAL | Status: AC
  Administered 2023-11-24: 650 mg via ORAL
  Filled 2023-11-24: qty 2

## 2023-11-24 NOTE — Progress Notes (Signed)
 Patient confirmed he took his dex at home prior to his appt today.

## 2023-11-24 NOTE — Patient Instructions (Signed)
 [facility] - A DEPT OF [facility]  Discharge Instructions: Thank you for choosing [facility] to provide your oncology and hematology care.   If you have a lab appointment with the Cancer Center, please go directly to the Cancer Center and check in at the registration area.   Wear comfortable clothing and clothing appropriate for easy access to any Portacath or PICC line.   We strive to give you quality time with your provider. You may need to reschedule your appointment if you arrive late (15 or more minutes).  Arriving late affects you and other patients whose appointments are after yours.  Also, if you miss three or more appointments without notifying the office, you may be dismissed from the clinic at the provider's discretion.      For prescription refill requests, have your pharmacy contact our office and allow 72 hours for refills to be completed.    Today you received the following chemotherapy and/or immunotherapy agents: Velcade and Darzalex Faspro      To help prevent nausea and vomiting after your treatment, we encourage you to take your nausea medication as directed.  BELOW ARE SYMPTOMS THAT SHOULD BE REPORTED IMMEDIATELY: *FEVER GREATER THAN 100.4 F (38 C) OR HIGHER *CHILLS OR SWEATING *NAUSEA AND VOMITING THAT IS NOT CONTROLLED WITH YOUR NAUSEA MEDICATION *UNUSUAL SHORTNESS OF BREATH *UNUSUAL BRUISING OR BLEEDING *URINARY PROBLEMS (pain or burning when urinating, or frequent urination) *BOWEL PROBLEMS (unusual diarrhea, constipation, pain near the anus) TENDERNESS IN MOUTH AND THROAT WITH OR WITHOUT PRESENCE OF ULCERS (sore throat, sores in mouth, or a toothache) UNUSUAL RASH, SWELLING OR PAIN  UNUSUAL VAGINAL DISCHARGE OR ITCHING   Items with * indicate a potential emergency and should be followed up as soon as possible or go to the Emergency Department if any problems should occur.  Please show the CHEMOTHERAPY ALERT CARD  or IMMUNOTHERAPY ALERT CARD at check-in to the Emergency Department and triage nurse.  Should you have questions after your visit or need to cancel or reschedule your appointment, please contact [facility] - A DEPT OF [facility]  Dept: [phone]  and follow the prompts.  Office hours are 8:00 a.m. to 4:30 p.m. Monday - Friday. Please note that voicemails left after 4:00 p.m. may not be returned until the following business day.  We are closed weekends and major holidays. You have access to a nurse at all times for urgent questions. Please call the main number to the clinic Dept: [phone] and follow the prompts.   For any non-urgent questions, you may also contact your provider using MyChart. We now offer e-Visits for anyone 45 and older to request care online for non-urgent symptoms. For details visit [email or web address].   Also download the MyChart app! Go to the app store, search "MyChart", open the app, select [facility], and log in with your MyChart username and password.  Bortezomib Injection  What is this medication? BORTEZOMIB (bor TEZ oh mib) treats lymphoma. It may also be used to treat multiple myeloma, a type of bone marrow cancer. It works by blocking a protein that causes cancer cells to grow and multiply. This helps to slow or stop the spread of cancer cells. This medicine may be used for other purposes; ask your health care provider or pharmacist if you have questions. COMMON BRAND NAME(S): BORUZU, Velcade What should I tell my care team before I take this  medication? They need to know if you have any of these conditions: Dehydration Diabetes Heart disease Liver disease Tingling of the fingers or toes or other nerve disorder An unusual or allergic reaction to bortezomib, other medications, foods, dyes, or preservatives If you or your partner are pregnant or trying to get pregnant Breastfeeding How should I use this  medication? This medication is injected into a vein or under the skin. It is given by your care team in a hospital or clinic setting. Talk to your care team about the use of this medication in children. Special care may be needed. Overdosage: If you think you have taken too much of this medicine contact a poison control center or emergency room at once. NOTE: This medicine is only for you. Do not share this medicine with others. What if I miss a dose? Keep appointments for follow-up doses. It is important not to miss your dose. Call your care team if you are unable to keep an appointment. What may interact with this medication? Ketoconazole Rifampin This list may not describe all possible interactions. Give your health care provider a list of all the medicines, herbs, non-prescription drugs, or dietary supplements you use. Also tell them if you smoke, drink alcohol, or use illegal drugs. Some items may interact with your medicine. What should I watch for while using this medication? Your condition will be monitored carefully while you are receiving this medication. You may need blood work while taking this medication. This medication may affect your coordination, reaction time, or judgment. Do not drive or operate machinery until you know how this medication affects you. Sit up or stand slowly to reduce the risk of dizzy or fainting spells. Drinking alcohol with this medication can increase the risk of these side effects. This medication may increase your risk of getting an infection. Call your care team for advice if you get a fever, chills, sore throat, or other symptoms of a cold or flu. Do not treat yourself. Try to avoid being around people who are sick. Check with your care team if you have severe diarrhea, nausea, and vomiting, or if you sweat a lot. The loss of too much body fluid may make it dangerous for you to take this medication. Talk to your care team if you may be pregnant. Serious  birth defects can occur if you take this medication during pregnancy and for 7 months after the last dose. You will need a negative pregnancy test before starting this medication. Contraception is recommended while taking this medication and for 7 months after the last dose. Your care team can help you find the option that works for you. If your partner can get pregnant, use a condom during sex while taking this medication and for 4 months after the last dose. Do not breastfeed while taking this medication and for 2 months after the last dose. This medication may cause infertility. Talk to your care team if you are concerned about your fertility. What side effects may I notice from receiving this medication? Side effects that you should report to your care team as soon as possible: Allergic reactions--skin rash, itching, hives, swelling of the face, lips, tongue, or throat Bleeding--bloody or black, tar-like stools, vomiting blood or brown material that looks like coffee grounds, red or dark brown urine, small red or purple spots on skin, unusual bruising or bleeding Bleeding in the brain--severe headache, stiff neck, confusion, dizziness, change in vision, numbness or weakness of the face, arm, or leg,  trouble speaking, trouble walking, vomiting Bowel blockage--stomach cramping, unable to have a bowel movement or pass gas, loss of appetite, vomiting Heart failure--shortness of breath, swelling of the ankles, feet, or hands, sudden weight gain, unusual weakness or fatigue Infection--fever, chills, cough, sore throat, wounds that don't heal, pain or trouble when passing urine, general feeling of discomfort or being unwell Liver injury--right upper belly pain, loss of appetite, nausea, light-colored stool, dark yellow or brown urine, yellowing skin or eyes, unusual weakness or fatigue Low blood pressure--dizziness, feeling faint or lightheaded, blurry vision Lung injury--shortness of breath or trouble  breathing, cough, spitting up blood, chest pain, fever Pain, tingling, or numbness in the hands or feet Severe or prolonged diarrhea Stomach pain, bloody diarrhea, pale skin, unusual weakness or fatigue, decrease in the amount of urine, which may be signs of hemolytic uremic syndrome Sudden and severe headache, confusion, change in vision, seizures, which may be signs of posterior reversible encephalopathy syndrome (PRES) TTP--purple spots on the skin or inside the mouth, pale skin, yellowing skin or eyes, unusual weakness or fatigue, fever, fast or irregular heartbeat, confusion, change in vision, trouble speaking, trouble walking Tumor lysis syndrome (TLS)--nausea, vomiting, diarrhea, decrease in the amount of urine, dark urine, unusual weakness or fatigue, confusion, muscle pain or cramps, fast or irregular heartbeat, joint pain Side effects that usually do not require medical attention (report to your care team if they continue or are bothersome): Constipation Diarrhea Fatigue Loss of appetite Nausea This list may not describe all possible side effects. Call your doctor for medical advice about side effects. You may report side effects to FDA at 1-800-FDA-1088. Where should I keep my medication? This medication is given in a hospital or clinic. It will not be stored at home. NOTE: This sheet is a summary. It may not cover all possible information. If you have questions about this medicine, talk to your doctor, pharmacist, or health care provider.  2024 Elsevier/Gold Standard (2021-11-12 00:00:00)  Daratumumab Injection  What is this medication? DARATUMUMAB (dar a toom ue mab) treats multiple myeloma, a type of bone marrow cancer. It works by helping your immune system slow or stop the spread of cancer cells. It is a monoclonal antibody. This medicine may be used for other purposes; ask your health care provider or pharmacist if you have questions. COMMON BRAND NAME(S): DARZALEX What  should I tell my care team before I take this medication? They need to know if you have any of these conditions: Hereditary fructose intolerance Infection, such as chickenpox, herpes, hepatitis B Lung or breathing disease, such as asthma, COPD An unusual or allergic reaction to daratumumab, sorbitol, other medications, foods, dyes, or preservatives Pregnant or trying to get pregnant Breastfeeding How should I use this medication? This medication is injected into a vein. It is given by your care team in a hospital or clinic setting. Talk to your care team about the use of this medication in children. Special care may be needed. Overdosage: If you think you have taken too much of this medicine contact a poison control center or emergency room at once. NOTE: This medicine is only for you. Do not share this medicine with others. What if I miss a dose? Keep appointments for follow-up doses. It is important not to miss your dose. Call your care team if you are unable to keep an appointment. What may interact with this medication? Interactions have not been studied. This list may not describe all possible interactions. Give your health care  provider a list of all the medicines, herbs, non-prescription drugs, or dietary supplements you use. Also tell them if you smoke, drink alcohol, or use illegal drugs. Some items may interact with your medicine. What should I watch for while using this medication? Your condition will be monitored carefully while you are receiving this medication. This medication can cause serious allergic reactions. To reduce your risk, your care team may give you other medication to take before receiving this one. Be sure to follow the directions from your care team. This medication can affect the results of blood tests to match your blood type. These changes can last for up to 6 months after the final dose. Your care team will do blood tests to match your blood type before you  start treatment. Tell all of your care team that you are being treated with this medication before receiving a blood transfusion. This medication can affect the results of some tests used to determine treatment response; extra tests may be needed to evaluate response. Talk to your care team if you wish to become pregnant or think you are pregnant. This medication can cause serious birth defects if taken during pregnancy and for 3 months after the last dose. A reliable form of contraception is recommended while taking this medication and for 3 months after the last dose. Talk to your care team about effective forms of contraception. Do not breast-feed while taking this medication. What side effects may I notice from receiving this medication? Side effects that you should report to your care team as soon as possible: Allergic reactions--skin rash, itching, hives, swelling of the face, lips, tongue, or throat Infection--fever, chills, cough, sore throat, wounds that don't heal, pain or trouble when passing urine, general feeling of discomfort or being unwell Infusion reactions--chest pain, shortness of breath or trouble breathing, feeling faint or lightheaded Unusual bruising or bleeding Side effects that usually do not require medical attention (report to your care team if they continue or are bothersome): Constipation Diarrhea Fatigue Nausea Pain, tingling, or numbness in the hands or feet Swelling of the ankles, hands, or feet This list may not describe all possible side effects. Call your doctor for medical advice about side effects. You may report side effects to FDA at 1-800-FDA-1088. Where should I keep my medication? This medication is given in a hospital or clinic. It will not be stored at home. NOTE: This sheet is a summary. It may not cover all possible information. If you have questions about this medicine, talk to your doctor, pharmacist, or health care provider.  2024 Elsevier/Gold  Standard (2022-04-17 00:00:00)

## 2023-11-25 ENCOUNTER — Other Ambulatory Visit: Payer: Self-pay | Admitting: Internal Medicine

## 2023-11-30 ENCOUNTER — Telehealth: Payer: Self-pay

## 2023-11-30 ENCOUNTER — Ambulatory Visit: Payer: Self-pay

## 2023-11-30 ENCOUNTER — Ambulatory Visit: Admitting: Internal Medicine

## 2023-11-30 ENCOUNTER — Other Ambulatory Visit: Payer: Self-pay

## 2023-11-30 ENCOUNTER — Ambulatory Visit (HOSPITAL_COMMUNITY)
Admission: RE | Admit: 2023-11-30 | Discharge: 2023-11-30 | Disposition: A | Discharge status: Home / Self Care | Source: Ambulatory Visit | Attending: Vascular Surgery | Admitting: Vascular Surgery

## 2023-11-30 DIAGNOSIS — M7989 Other specified soft tissue disorders: Secondary | ICD-10-CM | POA: Diagnosis not present

## 2023-11-30 NOTE — Telephone Encounter (Signed)
 Pt's wife called concerned about another DVT in pt's LLE, as it has been swollen moderately since the weekend. He denies pain/redness. She said this is similar to how he experienced prior DVT earlier this year. Pt has been added on for a DVT study today. MD is aware and approved him to be seen by DVT clinic, if positive result. If negative, he can f/u with his PCP, per MD.

## 2023-11-30 NOTE — Progress Notes (Signed)
 Call report to [facility] MD on call (Dr Robins/pager [phone]). Okay to f/u with DVT clinic if positive for DVT.

## 2023-11-30 NOTE — Telephone Encounter (Signed)
 Copied from CRM [id number]. Topic: Clinical - Red Word Triage >> Nov 30, 2023 11:10 AM Oddis Bench wrote: Red Word that prompted transfer to Nurse Triage: Patient wife is calling the patient left lower leg is swollen does not have pain in it. Reason for Disposition  [1] Thigh, calf, or ankle swelling AND [2] only 1 side  Answer Assessment - Initial Assessment Questions 1. ONSET: "When did the swelling start?" (e.g., minutes, hours, days)     2 days 2. LOCATION: "What part of the leg is swollen?"  "Are both legs swollen or just one leg?"     L lower leg 3. SEVERITY: "How bad is the swelling?" (e.g., localized; mild, moderate, severe)   - Localized: Small area of swelling localized to one leg.   - MILD pedal edema: Swelling limited to foot and ankle, pitting edema < 1/4 inch (6 mm) deep, rest and elevation eliminate most or all swelling.   - MODERATE edema: Swelling of lower leg to knee, pitting edema > 1/4 inch (6 mm) deep, rest and elevation only partially reduce swelling.   - SEVERE edema: Swelling extends above knee, facial or hand swelling present.      moderate 4. REDNESS: "Does the swelling look red or infected?"     denies 5. PAIN: "Is the swelling painful to touch?" If Yes, ask: "How painful is it?"   (Scale 1-10; mild, moderate or severe)     Denies pain 6. FEVER: "Do you have a fever?" If Yes, ask: "What is it, how was it measured, and when did it start?"      denies 7. CAUSE: "What do you think is causing the leg swelling?"     unsure 8. MEDICAL HISTORY: "Do you have a history of blood clots (e.g., DVT), cancer, heart failure, kidney disease, or liver failure?"     Hx of blood clots in the same leg 9. RECURRENT SYMPTOM: "Have you had leg swelling before?" If Yes, ask: "When was the last time?" "What happened that time?"     Jan last clot in L leg 10. OTHER SYMPTOMS: "Do you have any other symptoms?" (e.g., chest pain, difficulty breathing)       Denies Pt advised to utilize UC as  no appts available in the clinic today, pt wife states "He has a doctor, and you want him to go to UC". Pt wife would like him to see PCP today. RN routing [facility] to clinic  Protocols used: Leg Swelling and Edema-A-AH

## 2023-12-01 ENCOUNTER — Inpatient Hospital Stay

## 2023-12-01 ENCOUNTER — Encounter: Payer: Self-pay | Admitting: Hematology and Oncology

## 2023-12-01 ENCOUNTER — Telehealth: Payer: Self-pay

## 2023-12-01 ENCOUNTER — Inpatient Hospital Stay: Admitting: Hematology and Oncology

## 2023-12-01 VITALS — BP 154/82 | HR 71 | Temp 97.4°F | Resp 18 | Ht 75.0 in | Wt 225.6 lb

## 2023-12-01 DIAGNOSIS — Z86718 Personal history of other venous thrombosis and embolism: Secondary | ICD-10-CM | POA: Diagnosis not present

## 2023-12-01 DIAGNOSIS — D539 Nutritional anemia, unspecified: Secondary | ICD-10-CM

## 2023-12-01 DIAGNOSIS — N1831 Chronic kidney disease, stage 3a: Secondary | ICD-10-CM | POA: Diagnosis not present

## 2023-12-01 DIAGNOSIS — Z5111 Encounter for antineoplastic chemotherapy: Secondary | ICD-10-CM | POA: Diagnosis not present

## 2023-12-01 DIAGNOSIS — K219 Gastro-esophageal reflux disease without esophagitis: Secondary | ICD-10-CM | POA: Diagnosis not present

## 2023-12-01 DIAGNOSIS — I2694 Multiple subsegmental pulmonary emboli without acute cor pulmonale: Secondary | ICD-10-CM | POA: Diagnosis not present

## 2023-12-01 DIAGNOSIS — C9000 Multiple myeloma not having achieved remission: Secondary | ICD-10-CM

## 2023-12-01 DIAGNOSIS — Z7901 Long term (current) use of anticoagulants: Secondary | ICD-10-CM | POA: Diagnosis not present

## 2023-12-01 DIAGNOSIS — Z5112 Encounter for antineoplastic immunotherapy: Secondary | ICD-10-CM | POA: Diagnosis not present

## 2023-12-01 DIAGNOSIS — Z86711 Personal history of pulmonary embolism: Secondary | ICD-10-CM | POA: Diagnosis not present

## 2023-12-01 LAB — CMP (CANCER CENTER ONLY)
ALT: 12 U/L (ref 0–44)
AST: 15 U/L (ref 15–41)
Albumin: 3.8 g/dL (ref 3.5–5.0)
Alkaline Phosphatase: 120 U/L (ref 38–126)
Anion gap: 3 — ABNORMAL LOW (ref 5–15)
BUN: 18 mg/dL (ref 8–23)
CO2: 28 mmol/L (ref 22–32)
Calcium: 9.2 mg/dL (ref 8.9–10.3)
Chloride: 105 mmol/L (ref 98–111)
Creatinine: 1.35 mg/dL — ABNORMAL HIGH (ref 0.61–1.24)
GFR, Estimated: 57 mL/min — ABNORMAL LOW (ref >60)
Glucose, Bld: 99 mg/dL (ref 70–99)
Potassium: 4.2 mmol/L (ref 3.5–5.1)
Sodium: 136 mmol/L (ref 135–145)
Total Bilirubin: 0.3 mg/dL (ref 0.0–1.2)
Total Protein: 7.9 g/dL (ref 6.5–8.1)

## 2023-12-01 LAB — CBC WITH DIFFERENTIAL (CANCER CENTER ONLY)
Abs Immature Granulocytes: 0.04 10*3/uL (ref 0.00–0.07)
Basophils Absolute: 0 10*3/uL (ref 0.0–0.1)
Basophils Relative: 0 %
Eosinophils Absolute: 0 10*3/uL (ref 0.0–0.5)
Eosinophils Relative: 0 %
HCT: 27.2 % — ABNORMAL LOW (ref 39.0–52.0)
Hemoglobin: 9.1 g/dL — ABNORMAL LOW (ref 13.0–17.0)
Immature Granulocytes: 1 %
Lymphocytes Relative: 6 %
Lymphs Abs: 0.5 10*3/uL — ABNORMAL LOW (ref 0.7–4.0)
MCH: 30.1 pg (ref 26.0–34.0)
MCHC: 33.5 g/dL (ref 30.0–36.0)
MCV: 90.1 fL (ref 80.0–100.0)
Monocytes Absolute: 0.4 10*3/uL (ref 0.1–1.0)
Monocytes Relative: 5 %
Neutro Abs: 7.7 10*3/uL (ref 1.7–7.7)
Neutrophils Relative %: 88 %
Platelet Count: 171 10*3/uL (ref 150–400)
RBC: 3.02 MIL/uL — ABNORMAL LOW (ref 4.22–5.81)
RDW: 14.9 % (ref 11.5–15.5)
WBC Count: 8.7 10*3/uL (ref 4.0–10.5)
nRBC: 0 % (ref 0.0–0.2)

## 2023-12-01 MED ORDER — BORTEZOMIB CHEMO SQ INJECTION 3.5 MG (2.5MG/ML)
1.3000 mg/m2 | Freq: Once | INTRAMUSCULAR | Status: AC
  Administered 2023-12-01: 3 mg via SUBCUTANEOUS
  Filled 2023-12-01: qty 1.2

## 2023-12-01 MED ORDER — OMEPRAZOLE 40 MG PO CPDR: 40.0000 mg | DELAYED_RELEASE_CAPSULE | Freq: Every day | ORAL | 1 refills | Status: DC

## 2023-12-01 MED ORDER — ACETAMINOPHEN 325 MG PO TABS
650.0000 mg | ORAL_TABLET | Freq: Once | ORAL | Status: AC
  Administered 2023-12-01: 650 mg via ORAL
  Filled 2023-12-01: qty 2

## 2023-12-01 MED ORDER — DARATUMUMAB-HYALURONIDASE-FIHJ 1800-30000 MG-UT/15ML ~~LOC~~ SOLN
1800.0000 mg | Freq: Once | SUBCUTANEOUS | Status: AC
  Administered 2023-12-01: 1800 mg via SUBCUTANEOUS
  Filled 2023-12-01: qty 15

## 2023-12-01 MED ORDER — DIPHENHYDRAMINE HCL 25 MG PO CAPS
50.0000 mg | ORAL_CAPSULE | Freq: Once | ORAL | Status: AC
  Administered 2023-12-01: 50 mg via ORAL
  Filled 2023-12-01: qty 2

## 2023-12-01 MED ORDER — DEXAMETHASONE 4 MG PO TABS: ORAL_TABLET | ORAL | 2 refills | Status: DC

## 2023-12-01 NOTE — Progress Notes (Signed)
 Pt. states he took his Decadron  at home prior to appt. today.

## 2023-12-01 NOTE — Progress Notes (Signed)
 [facility]  Patient Care Team: [name], MD as PCP - General (Internal Medicine) [name], RN as Oncology Nurse Navigator  Assessment & Plan Multiple myeloma not having achieved remission [facility]) The patient have prior diagnosis of smoldering myeloma.  He was referred to Dr. 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 PET/CT imaging was reviewed with the patient and his wife which show no evidence of significant bone involvement/fractures  So far, he tolerated treatment well except for intermittent reflux on the days that he takes treatment I warned him about risk of severe hyperglycemia due to his history of diabetes We discussed the role of allopurinol  for tumor lysis prophylaxis and acyclovir  for antimicrobial prophylaxis We have received Zometa  recently Repeat myeloma panel is drawn today and I will see him next week to discuss test results He will proceed with chemotherapy as scheduled Multiple subsegmental pulmonary emboli without acute cor pulmonale (HCC) 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 to the point he needed thrombectomy He is doing well on anticoagulation therapy Given his current diagnosis of multiple myeloma, I recommend he continue anticoagulation therapy for a longer period of time than the 6 months that he was told Due to his history of significant clot burden, I am reluctant to add lenalidomide to his treatment right now Deficiency anemia He has multifactorial anemia, 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 He is not symptomatic Observe closely for now Gastroesophageal reflux  disease without esophagitis He has significant symptoms of reflux This is due to steroids As soon as I have confirmation that he has good response to treatment, I will initiate steroid taper For now, I will place him on proton pump inhibitor in the morning and the patient can chew Tums as needed throughout the day  No orders of the defined types were placed in this encounter.    William Jacobs, MD  INTERVAL HISTORY: he returns for treatment follow-up Complications related to previous cycle of chemotherapy included anemia, and symptomatic reflux The heartburn bothers him Otherwise, he have no other side effects of treatment  PHYSICAL EXAMINATION: ECOG PERFORMANCE STATUS: 1 - Symptomatic but completely ambulatory  No results found for: "CAN125"    Latest Ref Rng & Units 12/01/2023   11:05 AM 11/24/2023   10:55 AM 11/17/2023   10:16 AM  CBC  WBC 4.0 - 10.5 K/uL 8.7  5.5  3.5   Hemoglobin 13.0 - 17.0 g/dL 9.1  8.8  9.1   Hematocrit 39.0 - 52.0 % 27.2  26.2  27.3   Platelets 150 - 400 K/uL 171  191  158       Chemistry      Component Value Date/Time   NA 136 12/01/2023 1105   NA 137 11/05/2022 1152   K 4.2 12/01/2023 1105   CL 105 12/01/2023 1105   CO2 28 12/01/2023 1105   BUN 18 12/01/2023 1105   BUN 11 11/05/2022 1152   CREATININE 1.35 (H) 12/01/2023 1105      Component Value Date/Time   CALCIUM  9.2 12/01/2023 1105   ALKPHOS 120 12/01/2023 1105   AST 15 12/01/2023 1105   ALT 12  12/01/2023 1105   BILITOT 0.3 12/01/2023 1105       Vitals:   12/01/23 1145  BP: (!) 154/82  Pulse: 71  Resp: 18  Temp: (!) 97.4 F (36.3 C)  SpO2: 100%   Filed Weights   12/01/23 1145  Weight: 225 lb 9.6 oz (102.3 kg)   Other relevant data reviewed during this visit included CBC and CMP

## 2023-12-01 NOTE — Patient Instructions (Signed)
 [facility] - A DEPT OF [facility]  Discharge Instructions: Thank you for choosing [facility] to provide your oncology and hematology care.   If you have a lab appointment with the Cancer Center, please go directly to the Cancer Center and check in at the registration area.   Wear comfortable clothing and clothing appropriate for easy access to any Portacath or PICC line.   We strive to give you quality time with your provider. You may need to reschedule your appointment if you arrive late (15 or more minutes).  Arriving late affects you and other patients whose appointments are after yours.  Also, if you miss three or more appointments without notifying the office, you may be dismissed from the clinic at the provider's discretion.      For prescription refill requests, have your pharmacy contact our office and allow 72 hours for refills to be completed.    Today you received the following chemotherapy and/or immunotherapy agents: Bortezomib (Velcade) and Daratumumab (Darzalex Faspro)      To help prevent nausea and vomiting after your treatment, we encourage you to take your nausea medication as directed.  BELOW ARE SYMPTOMS THAT SHOULD BE REPORTED IMMEDIATELY: *FEVER GREATER THAN 100.4 F (38 C) OR HIGHER *CHILLS OR SWEATING *NAUSEA AND VOMITING THAT IS NOT CONTROLLED WITH YOUR NAUSEA MEDICATION *UNUSUAL SHORTNESS OF BREATH *UNUSUAL BRUISING OR BLEEDING *URINARY PROBLEMS (pain or burning when urinating, or frequent urination) *BOWEL PROBLEMS (unusual diarrhea, constipation, pain near the anus) TENDERNESS IN MOUTH AND THROAT WITH OR WITHOUT PRESENCE OF ULCERS (sore throat, sores in mouth, or a toothache) UNUSUAL RASH, SWELLING OR PAIN  UNUSUAL VAGINAL DISCHARGE OR ITCHING   Items with * indicate a potential emergency and should be followed up as soon as possible or go to the Emergency Department if any problems should occur.  Please show  the CHEMOTHERAPY ALERT CARD or IMMUNOTHERAPY ALERT CARD at check-in to the Emergency Department and triage nurse.  Should you have questions after your visit or need to cancel or reschedule your appointment, please contact [facility] - A DEPT OF [facility]  Dept: [phone]  and follow the prompts.  Office hours are 8:00 a.m. to 4:30 p.m. Monday - Friday. Please note that voicemails left after 4:00 p.m. may not be returned until the following business day.  We are closed weekends and major holidays. You have access to a nurse at all times for urgent questions. Please call the main number to the clinic Dept: [phone] and follow the prompts.   For any non-urgent questions, you may also contact your provider using MyChart. We now offer e-Visits for anyone 75 and older to request care online for non-urgent symptoms. For details visit [email or web address].   Also download the MyChart app! Go to the app store, search "MyChart", open the app, select [place], and log in with your MyChart username and password.

## 2023-12-01 NOTE — Assessment & Plan Note (Addendum)
 He has multifactorial anemia, 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 He is not symptomatic Observe closely for now

## 2023-12-01 NOTE — Telephone Encounter (Signed)
 Pt's wife called yesterday to schedule an u/s for DVT due to sudden onset of LLE swelling. Pt's u/s showed the chronic clot that he has had. MD aware. Pt's wife aware of results of u/s and advised to f/u with PCP regarding the swelling. She verbalized understanding. No further questions/concerns at this time.

## 2023-12-01 NOTE — Assessment & Plan Note (Addendum)
 He has significant symptoms of reflux This is due to steroids As soon as I have confirmation that he has good response to treatment, I will initiate steroid taper For now, I will place him on proton pump inhibitor in the morning and the patient can chew Tums as needed throughout the day

## 2023-12-01 NOTE — Assessment & Plan Note (Addendum)
 The patient have prior diagnosis of smoldering myeloma.  He was referred to Dr. 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 PET/CT imaging was reviewed with the patient and his wife which show no evidence of significant bone involvement/fractures  So far, he tolerated treatment well except for intermittent reflux on the days that he takes treatment I warned him about risk of severe hyperglycemia due to his history of diabetes We discussed the role of allopurinol  for tumor lysis prophylaxis and acyclovir  for antimicrobial prophylaxis We have received Zometa  recently Repeat myeloma panel is drawn today and I will see him next week to discuss test results He will proceed with chemotherapy as scheduled

## 2023-12-01 NOTE — Assessment & Plan Note (Addendum)
 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

## 2023-12-02 ENCOUNTER — Encounter: Payer: Self-pay | Admitting: Hematology and Oncology

## 2023-12-02 LAB — KAPPA/LAMBDA LIGHT CHAINS
Kappa free light chain: 81.4 mg/L — ABNORMAL HIGH (ref 3.3–19.4)
Kappa, lambda light chain ratio: 18.09 — ABNORMAL HIGH (ref 0.26–1.65)
Lambda free light chains: 4.5 mg/L — ABNORMAL LOW (ref 5.7–26.3)

## 2023-12-07 ENCOUNTER — Inpatient Hospital Stay

## 2023-12-08 ENCOUNTER — Inpatient Hospital Stay

## 2023-12-08 ENCOUNTER — Encounter: Payer: Self-pay | Admitting: Hematology and Oncology

## 2023-12-08 ENCOUNTER — Inpatient Hospital Stay: Admitting: Hematology and Oncology

## 2023-12-08 VITALS — BP 143/71 | HR 82 | Temp 97.4°F | Resp 18 | Ht 75.0 in | Wt 232.4 lb

## 2023-12-08 VITALS — Wt 232.0 lb

## 2023-12-08 DIAGNOSIS — Z86718 Personal history of other venous thrombosis and embolism: Secondary | ICD-10-CM | POA: Diagnosis not present

## 2023-12-08 DIAGNOSIS — C9000 Multiple myeloma not having achieved remission: Secondary | ICD-10-CM | POA: Diagnosis not present

## 2023-12-08 DIAGNOSIS — N1831 Chronic kidney disease, stage 3a: Secondary | ICD-10-CM | POA: Diagnosis not present

## 2023-12-08 DIAGNOSIS — D539 Nutritional anemia, unspecified: Secondary | ICD-10-CM

## 2023-12-08 DIAGNOSIS — I2694 Multiple subsegmental pulmonary emboli without acute cor pulmonale: Secondary | ICD-10-CM

## 2023-12-08 DIAGNOSIS — K219 Gastro-esophageal reflux disease without esophagitis: Secondary | ICD-10-CM | POA: Diagnosis not present

## 2023-12-08 DIAGNOSIS — Z5112 Encounter for antineoplastic immunotherapy: Secondary | ICD-10-CM | POA: Diagnosis not present

## 2023-12-08 DIAGNOSIS — Z5111 Encounter for antineoplastic chemotherapy: Secondary | ICD-10-CM | POA: Diagnosis not present

## 2023-12-08 DIAGNOSIS — Z7901 Long term (current) use of anticoagulants: Secondary | ICD-10-CM | POA: Diagnosis not present

## 2023-12-08 DIAGNOSIS — Z86711 Personal history of pulmonary embolism: Secondary | ICD-10-CM | POA: Diagnosis not present

## 2023-12-08 LAB — CMP (CANCER CENTER ONLY)
ALT: 12 U/L (ref 0–44)
AST: 16 U/L (ref 15–41)
Albumin: 3.6 g/dL (ref 3.5–5.0)
Alkaline Phosphatase: 116 U/L (ref 38–126)
Anion gap: 3 — ABNORMAL LOW (ref 5–15)
BUN: 17 mg/dL (ref 8–23)
CO2: 25 mmol/L (ref 22–32)
Calcium: 8.7 mg/dL — ABNORMAL LOW (ref 8.9–10.3)
Chloride: 107 mmol/L (ref 98–111)
Creatinine: 1.29 mg/dL — ABNORMAL HIGH (ref 0.61–1.24)
GFR, Estimated: >60 mL/min (ref >60)
Glucose, Bld: 192 mg/dL — ABNORMAL HIGH (ref 70–99)
Potassium: 4.6 mmol/L (ref 3.5–5.1)
Sodium: 135 mmol/L (ref 135–145)
Total Bilirubin: 0.3 mg/dL (ref 0.0–1.2)
Total Protein: 7.3 g/dL (ref 6.5–8.1)

## 2023-12-08 LAB — CBC WITH DIFFERENTIAL (CANCER CENTER ONLY)
Abs Immature Granulocytes: 0.06 10*3/uL (ref 0.00–0.07)
Basophils Absolute: 0 10*3/uL (ref 0.0–0.1)
Basophils Relative: 0 %
Eosinophils Absolute: 0.1 10*3/uL (ref 0.0–0.5)
Eosinophils Relative: 1 %
HCT: 27 % — ABNORMAL LOW (ref 39.0–52.0)
Hemoglobin: 8.8 g/dL — ABNORMAL LOW (ref 13.0–17.0)
Immature Granulocytes: 1 %
Lymphocytes Relative: 5 %
Lymphs Abs: 0.4 10*3/uL — ABNORMAL LOW (ref 0.7–4.0)
MCH: 29.8 pg (ref 26.0–34.0)
MCHC: 32.6 g/dL (ref 30.0–36.0)
MCV: 91.5 fL (ref 80.0–100.0)
Monocytes Absolute: 0.3 10*3/uL (ref 0.1–1.0)
Monocytes Relative: 4 %
Neutro Abs: 6.8 10*3/uL (ref 1.7–7.7)
Neutrophils Relative %: 89 %
Platelet Count: 185 10*3/uL (ref 150–400)
RBC: 2.95 MIL/uL — ABNORMAL LOW (ref 4.22–5.81)
RDW: 15.6 % — ABNORMAL HIGH (ref 11.5–15.5)
WBC Count: 7.6 10*3/uL (ref 4.0–10.5)
nRBC: 0 % (ref 0.0–0.2)

## 2023-12-08 MED ORDER — BORTEZOMIB CHEMO SQ INJECTION 3.5 MG (2.5MG/ML)
1.3000 mg/m2 | Freq: Once | INTRAMUSCULAR | Status: AC
  Administered 2023-12-08: 3 mg via SUBCUTANEOUS
  Filled 2023-12-08: qty 1.2

## 2023-12-08 MED ORDER — ACETAMINOPHEN 325 MG PO TABS
650.0000 mg | ORAL_TABLET | Freq: Once | ORAL | Status: AC
  Administered 2023-12-08: 650 mg via ORAL
  Filled 2023-12-08: qty 2

## 2023-12-08 MED ORDER — DARATUMUMAB-HYALURONIDASE-FIHJ 1800-30000 MG-UT/15ML ~~LOC~~ SOLN
1800.0000 mg | Freq: Once | SUBCUTANEOUS | Status: AC
  Administered 2023-12-08: 1800 mg via SUBCUTANEOUS
  Filled 2023-12-08: qty 15

## 2023-12-08 MED ORDER — DIPHENHYDRAMINE HCL 25 MG PO CAPS
50.0000 mg | ORAL_CAPSULE | Freq: Once | ORAL | Status: AC
  Administered 2023-12-08: 50 mg via ORAL
  Filled 2023-12-08: qty 2

## 2023-12-08 NOTE — Assessment & Plan Note (Addendum)
 The patient have prior diagnosis of smoldering myeloma.  He was referred to Dr. 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 PET/CT imaging was reviewed with the patient and his wife which show no evidence of significant bone involvement/fractures  So far, he tolerated treatment well except for intermittent reflux on the days that he takes treatment I warned him about risk of severe hyperglycemia due to his history of diabetes He will continue to take allopurinol  for tumor lysis prophylaxis (until his prescription runs out) and acyclovir  for antimicrobial prophylaxis We have received Zometa  recently I reviewed recent myeloma panel which show positive response to therapy I plan to start dexamethasone  taper once we get M protein less than 1 g

## 2023-12-08 NOTE — Assessment & Plan Note (Addendum)
 He has multifactorial anemia, 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 He is not symptomatic Observe closely for now

## 2023-12-08 NOTE — Progress Notes (Signed)
 [facility] OFFICE PROGRESS NOTE  Patient Care Team: [name], MD as PCP - General (Internal Medicine) [name], RN as Oncology Nurse Navigator  Assessment & Plan Multiple myeloma not having achieved remission [facility]) The patient have prior diagnosis of smoldering myeloma.  He was referred to Dr. 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 PET/CT imaging was reviewed with the patient and his wife which show no evidence of significant bone involvement/fractures  So far, he tolerated treatment well except for intermittent reflux on the days that he takes treatment I warned him about risk of severe hyperglycemia due to his history of diabetes He will continue to take allopurinol  for tumor lysis prophylaxis (until his prescription runs out) and acyclovir  for antimicrobial prophylaxis We have received Zometa  recently I reviewed recent myeloma panel which show positive response to therapy I plan to start dexamethasone  taper once we get M protein less than 1 g Multiple subsegmental pulmonary emboli without acute cor pulmonale (HCC) 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 to the point he needed thrombectomy He is doing well on anticoagulation therapy Given his current diagnosis of multiple myeloma, I recommend he continue anticoagulation therapy for a longer period of time than the 6 months that he was told Due to his history of significant clot burden, I am reluctant to add lenalidomide to his treatment right now Deficiency anemia He has multifactorial anemia, 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 He is not symptomatic Observe  closely for now CKD stage 3a, GFR 45-59 ml/min (HCC) Discussed importance of adequate fluid hydration  No orders of the defined types were placed in this encounter.    [name], MD  INTERVAL HISTORY: he returns for treatment follow-up Complications related to previous cycle of chemotherapy included anemia, and elevated serum creatinine He denies side effects of treatment He is noted to have significant weight gain since our last visit and the patient has been somewhat noncompliant with dietary changes.  PHYSICAL EXAMINATION: ECOG PERFORMANCE STATUS: 1 - Symptomatic but completely ambulatory  No results found for: CAN125    Latest Ref Rng & Units 12/08/2023    9:27 AM 12/01/2023   11:05 AM 11/24/2023   10:55 AM  CBC  WBC 4.0 - 10.5 K/uL 7.6  8.7  5.5   Hemoglobin 13.0 - 17.0 g/dL 8.8  9.1  8.8   Hematocrit 39.0 - 52.0 % 27.0  27.2  26.2   Platelets 150 - 400 K/uL 185  171  191       Chemistry      Component Value Date/Time   NA 135 12/08/2023 0927   NA 137 11/05/2022 1152   K 4.6 12/08/2023 0927   CL 107 12/08/2023 0927   CO2 25 12/08/2023 0927   BUN 17 12/08/2023 0927   BUN 11 11/05/2022 1152   CREATININE 1.29 (H) 12/08/2023 0927      Component Value Date/Time   CALCIUM  8.7 (L) 12/08/2023 0927   ALKPHOS 116 12/08/2023 0927   AST 16 12/08/2023 0927   ALT 12 12/08/2023 0927   BILITOT 0.3 12/08/2023 0927       Vitals:   12/08/23 0945  BP: (!) 143/71  Pulse: 82  Resp: 18  Temp: (!) 97.4 F (36.3 C)  SpO2: 100%   Filed Weights   12/08/23 0945  Weight: 232 lb 6.4 oz (105.4 kg)   Other relevant data reviewed during this visit included CBC, CMP and recent myeloma panel

## 2023-12-08 NOTE — Assessment & Plan Note (Addendum)
 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

## 2023-12-08 NOTE — Assessment & Plan Note (Addendum)
 Discussed importance of adequate fluid hydration

## 2023-12-08 NOTE — Progress Notes (Signed)
 Pt informed this RN that he took premedication dexamethasone  at home prior to appts today.

## 2023-12-08 NOTE — Patient Instructions (Signed)
 [facility] - A DEPT OF [facility]  Discharge Instructions: Thank you for choosing [facility] to provide your oncology and hematology care.   If you have a lab appointment with the Cancer Center, please go directly to the Cancer Center and check in at the registration area.   Wear comfortable clothing and clothing appropriate for easy access to any Portacath or PICC line.   We strive to give you quality time with your provider. You may need to reschedule your appointment if you arrive late (15 or more minutes).  Arriving late affects you and other patients whose appointments are after yours.  Also, if you miss three or more appointments without notifying the office, you may be dismissed from the clinic at the provider's discretion.      For prescription refill requests, have your pharmacy contact our office and allow 72 hours for refills to be completed.    Today you received the following chemotherapy and/or immunotherapy agents: Velcade /Darzalex       To help prevent nausea and vomiting after your treatment, we encourage you to take your nausea medication as directed.  BELOW ARE SYMPTOMS THAT SHOULD BE REPORTED IMMEDIATELY: *FEVER GREATER THAN 100.4 F (38 C) OR HIGHER *CHILLS OR SWEATING *NAUSEA AND VOMITING THAT IS NOT CONTROLLED WITH YOUR NAUSEA MEDICATION *UNUSUAL SHORTNESS OF BREATH *UNUSUAL BRUISING OR BLEEDING *URINARY PROBLEMS (pain or burning when urinating, or frequent urination) *BOWEL PROBLEMS (unusual diarrhea, constipation, pain near the anus) TENDERNESS IN MOUTH AND THROAT WITH OR WITHOUT PRESENCE OF ULCERS (sore throat, sores in mouth, or a toothache) UNUSUAL RASH, SWELLING OR PAIN  UNUSUAL VAGINAL DISCHARGE OR ITCHING   Items with * indicate a potential emergency and should be followed up as soon as possible or go to the Emergency Department if any problems should occur.  Please show the CHEMOTHERAPY ALERT CARD or  IMMUNOTHERAPY ALERT CARD at check-in to the Emergency Department and triage nurse.  Should you have questions after your visit or need to cancel or reschedule your appointment, please contact [facility] - A DEPT OF [facility]  Dept: [phone]  and follow the prompts.  Office hours are 8:00 a.m. to 4:30 p.m. Monday - Friday. Please note that voicemails left after 4:00 p.m. may not be returned until the following business day.  We are closed weekends and major holidays. You have access to a nurse at all times for urgent questions. Please call the main number to the clinic Dept: [phone] and follow the prompts.   For any non-urgent questions, you may also contact your provider using MyChart. We now offer e-Visits for anyone 11 and older to request care online for non-urgent symptoms. For details visit [email or web address].   Also download the MyChart app! Go to the app store, search MyChart, open the app, select [place], and log in with your MyChart username and password.

## 2023-12-15 ENCOUNTER — Inpatient Hospital Stay

## 2023-12-15 ENCOUNTER — Ambulatory Visit: Admitting: Hematology and Oncology

## 2023-12-15 VITALS — BP 133/77 | HR 71 | Temp 98.1°F | Resp 12 | Wt 229.5 lb

## 2023-12-15 DIAGNOSIS — D539 Nutritional anemia, unspecified: Secondary | ICD-10-CM | POA: Diagnosis not present

## 2023-12-15 DIAGNOSIS — N1831 Chronic kidney disease, stage 3a: Secondary | ICD-10-CM | POA: Diagnosis not present

## 2023-12-15 DIAGNOSIS — C9000 Multiple myeloma not having achieved remission: Secondary | ICD-10-CM | POA: Diagnosis not present

## 2023-12-15 DIAGNOSIS — Z5111 Encounter for antineoplastic chemotherapy: Secondary | ICD-10-CM | POA: Diagnosis not present

## 2023-12-15 DIAGNOSIS — Z5112 Encounter for antineoplastic immunotherapy: Secondary | ICD-10-CM | POA: Diagnosis not present

## 2023-12-15 DIAGNOSIS — Z7901 Long term (current) use of anticoagulants: Secondary | ICD-10-CM | POA: Diagnosis not present

## 2023-12-15 DIAGNOSIS — K219 Gastro-esophageal reflux disease without esophagitis: Secondary | ICD-10-CM | POA: Diagnosis not present

## 2023-12-15 DIAGNOSIS — Z86718 Personal history of other venous thrombosis and embolism: Secondary | ICD-10-CM | POA: Diagnosis not present

## 2023-12-15 DIAGNOSIS — Z86711 Personal history of pulmonary embolism: Secondary | ICD-10-CM | POA: Diagnosis not present

## 2023-12-15 LAB — CBC WITH DIFFERENTIAL (CANCER CENTER ONLY)
Abs Immature Granulocytes: 0.03 10*3/uL (ref 0.00–0.07)
Basophils Absolute: 0 10*3/uL (ref 0.0–0.1)
Basophils Relative: 0 %
Eosinophils Absolute: 0.1 10*3/uL (ref 0.0–0.5)
Eosinophils Relative: 1 %
HCT: 26.9 % — ABNORMAL LOW (ref 39.0–52.0)
Hemoglobin: 8.8 g/dL — ABNORMAL LOW (ref 13.0–17.0)
Immature Granulocytes: 0 %
Lymphocytes Relative: 7 %
Lymphs Abs: 0.5 10*3/uL — ABNORMAL LOW (ref 0.7–4.0)
MCH: 30 pg (ref 26.0–34.0)
MCHC: 32.7 g/dL (ref 30.0–36.0)
MCV: 91.8 fL (ref 80.0–100.0)
Monocytes Absolute: 0.4 10*3/uL (ref 0.1–1.0)
Monocytes Relative: 6 %
Neutro Abs: 6.2 10*3/uL (ref 1.7–7.7)
Neutrophils Relative %: 86 %
Platelet Count: 192 10*3/uL (ref 150–400)
RBC: 2.93 MIL/uL — ABNORMAL LOW (ref 4.22–5.81)
RDW: 16.1 % — ABNORMAL HIGH (ref 11.5–15.5)
WBC Count: 7.2 10*3/uL (ref 4.0–10.5)
nRBC: 0 % (ref 0.0–0.2)

## 2023-12-15 LAB — CMP (CANCER CENTER ONLY)
ALT: 12 U/L (ref 0–44)
AST: 17 U/L (ref 15–41)
Albumin: 3.7 g/dL (ref 3.5–5.0)
Alkaline Phosphatase: 121 U/L (ref 38–126)
Anion gap: 4 — ABNORMAL LOW (ref 5–15)
BUN: 15 mg/dL (ref 8–23)
CO2: 26 mmol/L (ref 22–32)
Calcium: 8.6 mg/dL — ABNORMAL LOW (ref 8.9–10.3)
Chloride: 107 mmol/L (ref 98–111)
Creatinine: 1.27 mg/dL — ABNORMAL HIGH (ref 0.61–1.24)
GFR, Estimated: >60 mL/min (ref >60)
Glucose, Bld: 111 mg/dL — ABNORMAL HIGH (ref 70–99)
Potassium: 4.5 mmol/L (ref 3.5–5.1)
Sodium: 137 mmol/L (ref 135–145)
Total Bilirubin: 0.4 mg/dL (ref 0.0–1.2)
Total Protein: 7.2 g/dL (ref 6.5–8.1)

## 2023-12-15 MED ORDER — ACETAMINOPHEN 325 MG PO TABS
650.0000 mg | ORAL_TABLET | Freq: Once | ORAL | Status: AC
  Administered 2023-12-15: 650 mg via ORAL
  Filled 2023-12-15: qty 2

## 2023-12-15 MED ORDER — BORTEZOMIB CHEMO SQ INJECTION 3.5 MG (2.5MG/ML)
1.3000 mg/m2 | Freq: Once | INTRAMUSCULAR | Status: AC
  Administered 2023-12-15: 3 mg via SUBCUTANEOUS
  Filled 2023-12-15: qty 1.2

## 2023-12-15 MED ORDER — DIPHENHYDRAMINE HCL 25 MG PO CAPS
50.0000 mg | ORAL_CAPSULE | Freq: Once | ORAL | Status: AC
  Administered 2023-12-15: 50 mg via ORAL
  Filled 2023-12-15: qty 2

## 2023-12-15 MED ORDER — DARATUMUMAB-HYALURONIDASE-FIHJ 1800-30000 MG-UT/15ML ~~LOC~~ SOLN
1800.0000 mg | Freq: Once | SUBCUTANEOUS | Status: AC
  Administered 2023-12-15: 1800 mg via SUBCUTANEOUS
  Filled 2023-12-15: qty 15

## 2023-12-15 NOTE — Patient Instructions (Signed)
 [facility] - A DEPT OF [facility]  Discharge Instructions: Thank you for choosing [facility] to provide your oncology and hematology care.   If you have a lab appointment with the Cancer Center, please go directly to the Cancer Center and check in at the registration area.   Wear comfortable clothing and clothing appropriate for easy access to any Portacath or PICC line.   We strive to give you quality time with your provider. You may need to reschedule your appointment if you arrive late (15 or more minutes).  Arriving late affects you and other patients whose appointments are after yours.  Also, if you miss three or more appointments without notifying the office, you may be dismissed from the clinic at the provider's discretion.      For prescription refill requests, have your pharmacy contact our office and allow 72 hours for refills to be completed.    Today you received the following chemotherapy and/or immunotherapy agents: Velcade and Darzalex Faspro      To help prevent nausea and vomiting after your treatment, we encourage you to take your nausea medication as directed.  BELOW ARE SYMPTOMS THAT SHOULD BE REPORTED IMMEDIATELY: *FEVER GREATER THAN 100.4 F (38 C) OR HIGHER *CHILLS OR SWEATING *NAUSEA AND VOMITING THAT IS NOT CONTROLLED WITH YOUR NAUSEA MEDICATION *UNUSUAL SHORTNESS OF BREATH *UNUSUAL BRUISING OR BLEEDING *URINARY PROBLEMS (pain or burning when urinating, or frequent urination) *BOWEL PROBLEMS (unusual diarrhea, constipation, pain near the anus) TENDERNESS IN MOUTH AND THROAT WITH OR WITHOUT PRESENCE OF ULCERS (sore throat, sores in mouth, or a toothache) UNUSUAL RASH, SWELLING OR PAIN  UNUSUAL VAGINAL DISCHARGE OR ITCHING   Items with * indicate a potential emergency and should be followed up as soon as possible or go to the Emergency Department if any problems should occur.  Please show the CHEMOTHERAPY ALERT CARD  or IMMUNOTHERAPY ALERT CARD at check-in to the Emergency Department and triage nurse.  Should you have questions after your visit or need to cancel or reschedule your appointment, please contact [facility] - A DEPT OF [facility]  Dept: [phone]  and follow the prompts.  Office hours are 8:00 a.m. to 4:30 p.m. Monday - Friday. Please note that voicemails left after 4:00 p.m. may not be returned until the following business day.  We are closed weekends and major holidays. You have access to a nurse at all times for urgent questions. Please call the main number to the clinic Dept: [phone] and follow the prompts.   For any non-urgent questions, you may also contact your provider using MyChart. We now offer e-Visits for anyone 45 and older to request care online for non-urgent symptoms. For details visit [email or web address].   Also download the MyChart app! Go to the app store, search "MyChart", open the app, select [facility], and log in with your MyChart username and password.  Bortezomib Injection  What is this medication? BORTEZOMIB (bor TEZ oh mib) treats lymphoma. It may also be used to treat multiple myeloma, a type of bone marrow cancer. It works by blocking a protein that causes cancer cells to grow and multiply. This helps to slow or stop the spread of cancer cells. This medicine may be used for other purposes; ask your health care provider or pharmacist if you have questions. COMMON BRAND NAME(S): BORUZU, Velcade What should I tell my care team before I take this  medication? They need to know if you have any of these conditions: Dehydration Diabetes Heart disease Liver disease Tingling of the fingers or toes or other nerve disorder An unusual or allergic reaction to bortezomib, other medications, foods, dyes, or preservatives If you or your partner are pregnant or trying to get pregnant Breastfeeding How should I use this  medication? This medication is injected into a vein or under the skin. It is given by your care team in a hospital or clinic setting. Talk to your care team about the use of this medication in children. Special care may be needed. Overdosage: If you think you have taken too much of this medicine contact a poison control center or emergency room at once. NOTE: This medicine is only for you. Do not share this medicine with others. What if I miss a dose? Keep appointments for follow-up doses. It is important not to miss your dose. Call your care team if you are unable to keep an appointment. What may interact with this medication? Ketoconazole Rifampin This list may not describe all possible interactions. Give your health care provider a list of all the medicines, herbs, non-prescription drugs, or dietary supplements you use. Also tell them if you smoke, drink alcohol, or use illegal drugs. Some items may interact with your medicine. What should I watch for while using this medication? Your condition will be monitored carefully while you are receiving this medication. You may need blood work while taking this medication. This medication may affect your coordination, reaction time, or judgment. Do not drive or operate machinery until you know how this medication affects you. Sit up or stand slowly to reduce the risk of dizzy or fainting spells. Drinking alcohol with this medication can increase the risk of these side effects. This medication may increase your risk of getting an infection. Call your care team for advice if you get a fever, chills, sore throat, or other symptoms of a cold or flu. Do not treat yourself. Try to avoid being around people who are sick. Check with your care team if you have severe diarrhea, nausea, and vomiting, or if you sweat a lot. The loss of too much body fluid may make it dangerous for you to take this medication. Talk to your care team if you may be pregnant. Serious  birth defects can occur if you take this medication during pregnancy and for 7 months after the last dose. You will need a negative pregnancy test before starting this medication. Contraception is recommended while taking this medication and for 7 months after the last dose. Your care team can help you find the option that works for you. If your partner can get pregnant, use a condom during sex while taking this medication and for 4 months after the last dose. Do not breastfeed while taking this medication and for 2 months after the last dose. This medication may cause infertility. Talk to your care team if you are concerned about your fertility. What side effects may I notice from receiving this medication? Side effects that you should report to your care team as soon as possible: Allergic reactions--skin rash, itching, hives, swelling of the face, lips, tongue, or throat Bleeding--bloody or black, tar-like stools, vomiting blood or brown material that looks like coffee grounds, red or dark brown urine, small red or purple spots on skin, unusual bruising or bleeding Bleeding in the brain--severe headache, stiff neck, confusion, dizziness, change in vision, numbness or weakness of the face, arm, or leg,  trouble speaking, trouble walking, vomiting Bowel blockage--stomach cramping, unable to have a bowel movement or pass gas, loss of appetite, vomiting Heart failure--shortness of breath, swelling of the ankles, feet, or hands, sudden weight gain, unusual weakness or fatigue Infection--fever, chills, cough, sore throat, wounds that don't heal, pain or trouble when passing urine, general feeling of discomfort or being unwell Liver injury--right upper belly pain, loss of appetite, nausea, light-colored stool, dark yellow or brown urine, yellowing skin or eyes, unusual weakness or fatigue Low blood pressure--dizziness, feeling faint or lightheaded, blurry vision Lung injury--shortness of breath or trouble  breathing, cough, spitting up blood, chest pain, fever Pain, tingling, or numbness in the hands or feet Severe or prolonged diarrhea Stomach pain, bloody diarrhea, pale skin, unusual weakness or fatigue, decrease in the amount of urine, which may be signs of hemolytic uremic syndrome Sudden and severe headache, confusion, change in vision, seizures, which may be signs of posterior reversible encephalopathy syndrome (PRES) TTP--purple spots on the skin or inside the mouth, pale skin, yellowing skin or eyes, unusual weakness or fatigue, fever, fast or irregular heartbeat, confusion, change in vision, trouble speaking, trouble walking Tumor lysis syndrome (TLS)--nausea, vomiting, diarrhea, decrease in the amount of urine, dark urine, unusual weakness or fatigue, confusion, muscle pain or cramps, fast or irregular heartbeat, joint pain Side effects that usually do not require medical attention (report to your care team if they continue or are bothersome): Constipation Diarrhea Fatigue Loss of appetite Nausea This list may not describe all possible side effects. Call your doctor for medical advice about side effects. You may report side effects to FDA at 1-800-FDA-1088. Where should I keep my medication? This medication is given in a hospital or clinic. It will not be stored at home. NOTE: This sheet is a summary. It may not cover all possible information. If you have questions about this medicine, talk to your doctor, pharmacist, or health care provider.  2024 Elsevier/Gold Standard (2021-11-12 00:00:00)  Daratumumab Injection  What is this medication? DARATUMUMAB (dar a toom ue mab) treats multiple myeloma, a type of bone marrow cancer. It works by helping your immune system slow or stop the spread of cancer cells. It is a monoclonal antibody. This medicine may be used for other purposes; ask your health care provider or pharmacist if you have questions. COMMON BRAND NAME(S): DARZALEX What  should I tell my care team before I take this medication? They need to know if you have any of these conditions: Hereditary fructose intolerance Infection, such as chickenpox, herpes, hepatitis B Lung or breathing disease, such as asthma, COPD An unusual or allergic reaction to daratumumab, sorbitol, other medications, foods, dyes, or preservatives Pregnant or trying to get pregnant Breastfeeding How should I use this medication? This medication is injected into a vein. It is given by your care team in a hospital or clinic setting. Talk to your care team about the use of this medication in children. Special care may be needed. Overdosage: If you think you have taken too much of this medicine contact a poison control center or emergency room at once. NOTE: This medicine is only for you. Do not share this medicine with others. What if I miss a dose? Keep appointments for follow-up doses. It is important not to miss your dose. Call your care team if you are unable to keep an appointment. What may interact with this medication? Interactions have not been studied. This list may not describe all possible interactions. Give your health care  provider a list of all the medicines, herbs, non-prescription drugs, or dietary supplements you use. Also tell them if you smoke, drink alcohol, or use illegal drugs. Some items may interact with your medicine. What should I watch for while using this medication? Your condition will be monitored carefully while you are receiving this medication. This medication can cause serious allergic reactions. To reduce your risk, your care team may give you other medication to take before receiving this one. Be sure to follow the directions from your care team. This medication can affect the results of blood tests to match your blood type. These changes can last for up to 6 months after the final dose. Your care team will do blood tests to match your blood type before you  start treatment. Tell all of your care team that you are being treated with this medication before receiving a blood transfusion. This medication can affect the results of some tests used to determine treatment response; extra tests may be needed to evaluate response. Talk to your care team if you wish to become pregnant or think you are pregnant. This medication can cause serious birth defects if taken during pregnancy and for 3 months after the last dose. A reliable form of contraception is recommended while taking this medication and for 3 months after the last dose. Talk to your care team about effective forms of contraception. Do not breast-feed while taking this medication. What side effects may I notice from receiving this medication? Side effects that you should report to your care team as soon as possible: Allergic reactions--skin rash, itching, hives, swelling of the face, lips, tongue, or throat Infection--fever, chills, cough, sore throat, wounds that don't heal, pain or trouble when passing urine, general feeling of discomfort or being unwell Infusion reactions--chest pain, shortness of breath or trouble breathing, feeling faint or lightheaded Unusual bruising or bleeding Side effects that usually do not require medical attention (report to your care team if they continue or are bothersome): Constipation Diarrhea Fatigue Nausea Pain, tingling, or numbness in the hands or feet Swelling of the ankles, hands, or feet This list may not describe all possible side effects. Call your doctor for medical advice about side effects. You may report side effects to FDA at 1-800-FDA-1088. Where should I keep my medication? This medication is given in a hospital or clinic. It will not be stored at home. NOTE: This sheet is a summary. It may not cover all possible information. If you have questions about this medicine, talk to your doctor, pharmacist, or health care provider.  2024 Elsevier/Gold  Standard (2022-04-17 00:00:00)

## 2023-12-22 ENCOUNTER — Inpatient Hospital Stay: Admitting: Hematology and Oncology

## 2023-12-22 ENCOUNTER — Encounter: Payer: Self-pay | Admitting: Hematology and Oncology

## 2023-12-22 ENCOUNTER — Inpatient Hospital Stay: Attending: Hematology and Oncology

## 2023-12-22 ENCOUNTER — Inpatient Hospital Stay

## 2023-12-22 VITALS — BP 145/67 | HR 72 | Temp 98.1°F | Resp 18 | Ht 75.0 in | Wt 236.8 lb

## 2023-12-22 DIAGNOSIS — Z5111 Encounter for antineoplastic chemotherapy: Secondary | ICD-10-CM | POA: Insufficient documentation

## 2023-12-22 DIAGNOSIS — D539 Nutritional anemia, unspecified: Secondary | ICD-10-CM | POA: Diagnosis not present

## 2023-12-22 DIAGNOSIS — E1165 Type 2 diabetes mellitus with hyperglycemia: Secondary | ICD-10-CM | POA: Diagnosis not present

## 2023-12-22 DIAGNOSIS — C9000 Multiple myeloma not having achieved remission: Secondary | ICD-10-CM

## 2023-12-22 DIAGNOSIS — Z85118 Personal history of other malignant neoplasm of bronchus and lung: Secondary | ICD-10-CM | POA: Diagnosis not present

## 2023-12-22 DIAGNOSIS — Z794 Long term (current) use of insulin: Secondary | ICD-10-CM

## 2023-12-22 DIAGNOSIS — N1831 Chronic kidney disease, stage 3a: Secondary | ICD-10-CM

## 2023-12-22 DIAGNOSIS — Z5112 Encounter for antineoplastic immunotherapy: Secondary | ICD-10-CM | POA: Insufficient documentation

## 2023-12-22 DIAGNOSIS — N183 Chronic kidney disease, stage 3 unspecified: Secondary | ICD-10-CM

## 2023-12-22 DIAGNOSIS — E1122 Type 2 diabetes mellitus with diabetic chronic kidney disease: Secondary | ICD-10-CM | POA: Diagnosis not present

## 2023-12-22 LAB — CMP (CANCER CENTER ONLY)
ALT: 16 U/L (ref 0–44)
AST: 19 U/L (ref 15–41)
Albumin: 4 g/dL (ref 3.5–5.0)
Alkaline Phosphatase: 139 U/L — ABNORMAL HIGH (ref 38–126)
Anion gap: 3 — ABNORMAL LOW (ref 5–15)
BUN: 16 mg/dL (ref 8–23)
CO2: 27 mmol/L (ref 22–32)
Calcium: 9.1 mg/dL (ref 8.9–10.3)
Chloride: 107 mmol/L (ref 98–111)
Creatinine: 1.3 mg/dL — ABNORMAL HIGH (ref 0.61–1.24)
GFR, Estimated: 59 mL/min — ABNORMAL LOW (ref >60)
Glucose, Bld: 152 mg/dL — ABNORMAL HIGH (ref 70–99)
Potassium: 4.4 mmol/L (ref 3.5–5.1)
Sodium: 137 mmol/L (ref 135–145)
Total Bilirubin: 0.4 mg/dL (ref 0.0–1.2)
Total Protein: 7.5 g/dL (ref 6.5–8.1)

## 2023-12-22 LAB — CBC WITH DIFFERENTIAL (CANCER CENTER ONLY)
Abs Immature Granulocytes: 0.05 10*3/uL (ref 0.00–0.07)
Basophils Absolute: 0 10*3/uL (ref 0.0–0.1)
Basophils Relative: 0 %
Eosinophils Absolute: 0 10*3/uL (ref 0.0–0.5)
Eosinophils Relative: 0 %
HCT: 28.1 % — ABNORMAL LOW (ref 39.0–52.0)
Hemoglobin: 9.3 g/dL — ABNORMAL LOW (ref 13.0–17.0)
Immature Granulocytes: 1 %
Lymphocytes Relative: 6 %
Lymphs Abs: 0.5 10*3/uL — ABNORMAL LOW (ref 0.7–4.0)
MCH: 30.5 pg (ref 26.0–34.0)
MCHC: 33.1 g/dL (ref 30.0–36.0)
MCV: 92.1 fL (ref 80.0–100.0)
Monocytes Absolute: 0.2 10*3/uL (ref 0.1–1.0)
Monocytes Relative: 2 %
Neutro Abs: 7.7 10*3/uL (ref 1.7–7.7)
Neutrophils Relative %: 91 %
Platelet Count: 180 10*3/uL (ref 150–400)
RBC: 3.05 MIL/uL — ABNORMAL LOW (ref 4.22–5.81)
RDW: 16.5 % — ABNORMAL HIGH (ref 11.5–15.5)
WBC Count: 8.4 10*3/uL (ref 4.0–10.5)
nRBC: 0 % (ref 0.0–0.2)

## 2023-12-22 MED ORDER — DIPHENHYDRAMINE HCL 25 MG PO CAPS
50.0000 mg | ORAL_CAPSULE | Freq: Once | ORAL | Status: AC
  Administered 2023-12-22: 50 mg via ORAL
  Filled 2023-12-22: qty 2

## 2023-12-22 MED ORDER — ACETAMINOPHEN 325 MG PO TABS
650.0000 mg | ORAL_TABLET | Freq: Once | ORAL | Status: AC
Start: 2023-12-22 — End: 2023-12-22
  Administered 2023-12-22: 650 mg via ORAL
  Filled 2023-12-22: qty 2

## 2023-12-22 MED ORDER — DARATUMUMAB-HYALURONIDASE-FIHJ 1800-30000 MG-UT/15ML ~~LOC~~ SOLN
1800.0000 mg | Freq: Once | SUBCUTANEOUS | Status: AC
  Administered 2023-12-22: 1800 mg via SUBCUTANEOUS
  Filled 2023-12-22: qty 15

## 2023-12-22 MED ORDER — ENALAPRIL MALEATE 10 MG PO TABS: 10.0000 mg | ORAL_TABLET | Freq: Every day | ORAL | 1 refills | Status: DC

## 2023-12-22 MED ORDER — BORTEZOMIB CHEMO SQ INJECTION 3.5 MG (2.5MG/ML)
1.3000 mg/m2 | Freq: Once | INTRAMUSCULAR | Status: AC
  Administered 2023-12-22: 3 mg via SUBCUTANEOUS
  Filled 2023-12-22: qty 1.2

## 2023-12-22 NOTE — Assessment & Plan Note (Addendum)
 Discussed importance of adequate fluid hydration

## 2023-12-22 NOTE — Progress Notes (Signed)
 [facility] OFFICE PROGRESS NOTE  Patient Care Team: Jarold Medici, MD as PCP - General (Internal Medicine) [name], RN as Oncology Nurse Navigator  Assessment & Plan Multiple myeloma not having achieved remission [facility]) The patient have prior diagnosis of smoldering myeloma.  He was referred to Dr. 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 PET/CT imaging was reviewed with the patient and his wife which show no evidence of significant bone involvement/fractures  So far, he tolerated treatment well except for intermittent reflux and high blood sugar on the days that he takes treatment He will continue to take acyclovir  for antimicrobial prophylaxis We have received Zometa  recently I reviewed recent myeloma panel which show positive response to therapy I plan to start dexamethasone  taper once we get M protein less than 1 g We discussed the role of bone marrow transplant and for now, he is not keen for transplant evaluation Deficiency anemia He has multifactorial anemia, 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 He is not symptomatic Observe closely for now CKD stage 3a, GFR 45-59 ml/min (HCC) Discussed importance of adequate fluid hydration Type 2 diabetes mellitus with stage 3 chronic kidney disease, with long-term current use of insulin , unspecified whether stage 3a or 3b CKD (HCC) He will continue vigilant blood sugar monitoring and adjustment to diet due to high risk of hyperglycemia while on treatment We discussed importance of dietary restriction while on treatment  No orders of the defined types were placed in this encounter.    William Bedford,  MD  INTERVAL HISTORY: he returns for treatment follow-up Complications related to previous cycle of chemotherapy included anemia, and elevated blood sugar His energy level is fair He denies recent infection We discussed recent myeloma panel and future plan of care We discussed risk and benefits of bone marrow transplant evaluation PHYSICAL EXAMINATION: ECOG PERFORMANCE STATUS: 0 - Asymptomatic  No results found for: [id number]    Latest Ref Rng & Units 12/22/2023   11:09 AM 12/15/2023    9:11 AM 12/08/2023    9:27 AM  CBC  WBC 4.0 - 10.5 K/uL 8.4  7.2  7.6   Hemoglobin 13.0 - 17.0 g/dL 9.3  8.8  8.8   Hematocrit 39.0 - 52.0 % 28.1  26.9  27.0   Platelets 150 - 400 K/uL 180  192  185       Chemistry      Component Value Date/Time   NA 137 12/22/2023 1109   NA 137 11/05/2022 1152   K 4.4 12/22/2023 1109   CL 107 12/22/2023 1109   CO2 27 12/22/2023 1109   BUN 16 12/22/2023 1109   BUN 11 11/05/2022 1152   CREATININE 1.30 (H) 12/22/2023 1109      Component Value Date/Time   CALCIUM  9.1 12/22/2023 1109   ALKPHOS 139 (H) 12/22/2023 1109   AST 19 12/22/2023 1109   ALT 16 12/22/2023 1109   BILITOT 0.4 12/22/2023 1109       Vitals:   12/22/23 1123  BP: (!) 145/67  Pulse: 72  Resp: 18  Temp: 98.1 F (36.7 C)  SpO2: 100%   Filed Weights   12/22/23 1123  Weight: 236 lb 12.8 oz (107.4  kg)   Other relevant data reviewed during this visit included CBC and CMP

## 2023-12-22 NOTE — Assessment & Plan Note (Addendum)
 He has multifactorial anemia, 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 He is not symptomatic Observe closely for now

## 2023-12-22 NOTE — Assessment & Plan Note (Addendum)
 The patient have prior diagnosis of smoldering myeloma.  He was referred to Dr. 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 PET/CT imaging was reviewed with the patient and his wife which show no evidence of significant bone involvement/fractures  So far, he tolerated treatment well except for intermittent reflux and high blood sugar on the days that he takes treatment He will continue to take acyclovir  for antimicrobial prophylaxis We have received Zometa  recently I reviewed recent myeloma panel which show positive response to therapy I plan to start dexamethasone  taper once we get M protein less than 1 g We discussed the role of bone marrow transplant and for now, he is not keen for transplant evaluation

## 2023-12-22 NOTE — Assessment & Plan Note (Addendum)
 He will continue vigilant blood sugar monitoring and adjustment to diet due to high risk of hyperglycemia while on treatment We discussed importance of dietary restriction while on treatment

## 2023-12-22 NOTE — Progress Notes (Signed)
 Patient stated he took his dexamethasone  at home prior to appointment.

## 2023-12-23 ENCOUNTER — Other Ambulatory Visit: Payer: Self-pay | Admitting: Hematology and Oncology

## 2023-12-23 LAB — KAPPA/LAMBDA LIGHT CHAINS
Kappa free light chain: 59.8 mg/L — ABNORMAL HIGH (ref 3.3–19.4)
Kappa, lambda light chain ratio: 10.87 — ABNORMAL HIGH (ref 0.26–1.65)
Lambda free light chains: 5.5 mg/L — ABNORMAL LOW (ref 5.7–26.3)

## 2023-12-24 LAB — MULTIPLE MYELOMA PANEL, SERUM
Albumin SerPl Elph-Mcnc: 3.5 g/dL (ref 2.9–4.4)
Albumin/Glob SerPl: 1.1 (ref 0.7–1.7)
Alpha 1: 0.2 g/dL (ref 0.0–0.4)
Alpha2 Glob SerPl Elph-Mcnc: 0.9 g/dL (ref 0.4–1.0)
B-Globulin SerPl Elph-Mcnc: 0.8 g/dL (ref 0.7–1.3)
Gamma Glob SerPl Elph-Mcnc: 1.5 g/dL (ref 0.4–1.8)
Globulin, Total: 3.4 g/dL (ref 2.2–3.9)
IgA: 27 mg/dL — ABNORMAL LOW (ref 61–437)
IgG (Immunoglobin G), Serum: 1830 mg/dL — ABNORMAL HIGH (ref 603–1613)
IgM (Immunoglobulin M), Srm: 38 mg/dL (ref 20–172)
M Protein SerPl Elph-Mcnc: 1.3 g/dL — ABNORMAL HIGH
Total Protein ELP: 6.9 g/dL (ref 6.0–8.5)

## 2023-12-26 ENCOUNTER — Other Ambulatory Visit: Payer: Self-pay | Admitting: Hematology and Oncology

## 2023-12-26 ENCOUNTER — Other Ambulatory Visit: Payer: Self-pay | Admitting: Physician Assistant

## 2023-12-26 DIAGNOSIS — I8290 Acute embolism and thrombosis of unspecified vein: Secondary | ICD-10-CM

## 2023-12-26 DIAGNOSIS — I2699 Other pulmonary embolism without acute cor pulmonale: Secondary | ICD-10-CM

## 2023-12-28 ENCOUNTER — Ambulatory Visit

## 2023-12-28 ENCOUNTER — Other Ambulatory Visit: Payer: Self-pay | Admitting: Internal Medicine

## 2023-12-28 ENCOUNTER — Encounter: Payer: Self-pay | Admitting: Hematology and Oncology

## 2023-12-28 ENCOUNTER — Other Ambulatory Visit

## 2023-12-29 ENCOUNTER — Inpatient Hospital Stay

## 2023-12-29 VITALS — BP 144/84 | HR 70 | Resp 16

## 2023-12-29 DIAGNOSIS — C9000 Multiple myeloma not having achieved remission: Secondary | ICD-10-CM

## 2023-12-29 DIAGNOSIS — Z5111 Encounter for antineoplastic chemotherapy: Secondary | ICD-10-CM | POA: Diagnosis not present

## 2023-12-29 DIAGNOSIS — Z5112 Encounter for antineoplastic immunotherapy: Secondary | ICD-10-CM | POA: Diagnosis not present

## 2023-12-29 DIAGNOSIS — E1165 Type 2 diabetes mellitus with hyperglycemia: Secondary | ICD-10-CM | POA: Diagnosis not present

## 2023-12-29 DIAGNOSIS — Z85118 Personal history of other malignant neoplasm of bronchus and lung: Secondary | ICD-10-CM | POA: Diagnosis not present

## 2023-12-29 DIAGNOSIS — N1831 Chronic kidney disease, stage 3a: Secondary | ICD-10-CM | POA: Diagnosis not present

## 2023-12-29 DIAGNOSIS — D539 Nutritional anemia, unspecified: Secondary | ICD-10-CM | POA: Diagnosis not present

## 2023-12-29 DIAGNOSIS — E1122 Type 2 diabetes mellitus with diabetic chronic kidney disease: Secondary | ICD-10-CM | POA: Diagnosis not present

## 2023-12-29 LAB — CBC WITH DIFFERENTIAL (CANCER CENTER ONLY)
Abs Immature Granulocytes: 0.01 K/uL (ref 0.00–0.07)
Basophils Absolute: 0 K/uL (ref 0.0–0.1)
Basophils Relative: 0 %
Eosinophils Absolute: 0.1 K/uL (ref 0.0–0.5)
Eosinophils Relative: 1 %
HCT: 28 % — ABNORMAL LOW (ref 39.0–52.0)
Hemoglobin: 9.2 g/dL — ABNORMAL LOW (ref 13.0–17.0)
Immature Granulocytes: 0 %
Lymphocytes Relative: 21 %
Lymphs Abs: 1.1 K/uL (ref 0.7–4.0)
MCH: 30.5 pg (ref 26.0–34.0)
MCHC: 32.9 g/dL (ref 30.0–36.0)
MCV: 92.7 fL (ref 80.0–100.0)
Monocytes Absolute: 0.6 K/uL (ref 0.1–1.0)
Monocytes Relative: 12 %
Neutro Abs: 3.5 K/uL (ref 1.7–7.7)
Neutrophils Relative %: 66 %
Platelet Count: 171 K/uL (ref 150–400)
RBC: 3.02 MIL/uL — ABNORMAL LOW (ref 4.22–5.81)
RDW: 16.7 % — ABNORMAL HIGH (ref 11.5–15.5)
WBC Count: 5.3 K/uL (ref 4.0–10.5)
nRBC: 0 % (ref 0.0–0.2)

## 2023-12-29 LAB — CMP (CANCER CENTER ONLY)
ALT: 13 U/L (ref 0–44)
AST: 18 U/L (ref 15–41)
Albumin: 3.6 g/dL (ref 3.5–5.0)
Alkaline Phosphatase: 120 U/L (ref 38–126)
Anion gap: 4 — ABNORMAL LOW (ref 5–15)
BUN: 15 mg/dL (ref 8–23)
CO2: 26 mmol/L (ref 22–32)
Calcium: 8.9 mg/dL (ref 8.9–10.3)
Chloride: 107 mmol/L (ref 98–111)
Creatinine: 1.3 mg/dL — ABNORMAL HIGH (ref 0.61–1.24)
GFR, Estimated: 59 mL/min — ABNORMAL LOW (ref >60)
Glucose, Bld: 105 mg/dL — ABNORMAL HIGH (ref 70–99)
Potassium: 4.8 mmol/L (ref 3.5–5.1)
Sodium: 137 mmol/L (ref 135–145)
Total Bilirubin: 0.4 mg/dL (ref 0.0–1.2)
Total Protein: 6.7 g/dL (ref 6.5–8.1)

## 2023-12-29 MED ORDER — BORTEZOMIB CHEMO SQ INJECTION 3.5 MG (2.5MG/ML)
1.3000 mg/m2 | Freq: Once | INTRAMUSCULAR | Status: AC
  Administered 2023-12-29: 3 mg via SUBCUTANEOUS
  Filled 2023-12-29: qty 1.2

## 2023-12-29 MED ORDER — DIPHENHYDRAMINE HCL 25 MG PO CAPS
50.0000 mg | ORAL_CAPSULE | Freq: Once | ORAL | Status: AC
  Administered 2023-12-29: 50 mg via ORAL
  Filled 2023-12-29: qty 2

## 2023-12-29 MED ORDER — DARATUMUMAB-HYALURONIDASE-FIHJ 1800-30000 MG-UT/15ML ~~LOC~~ SOLN
1800.0000 mg | Freq: Once | SUBCUTANEOUS | Status: AC
  Administered 2023-12-29: 1800 mg via SUBCUTANEOUS
  Filled 2023-12-29: qty 15

## 2023-12-29 MED ORDER — ACETAMINOPHEN 325 MG PO TABS
650.0000 mg | ORAL_TABLET | Freq: Once | ORAL | Status: AC
  Administered 2023-12-29: 650 mg via ORAL
  Filled 2023-12-29: qty 2

## 2023-12-29 NOTE — Patient Instructions (Signed)
 [facility] - A DEPT OF [facility]  Discharge Instructions: Thank you for choosing [facility] to provide your oncology and hematology care.   If you have a lab appointment with the Cancer Center, please go directly to the Cancer Center and check in at the registration area.   Wear comfortable clothing and clothing appropriate for easy access to any Portacath or PICC line.   We strive to give you quality time with your provider. You may need to reschedule your appointment if you arrive late (15 or more minutes).  Arriving late affects you and other patients whose appointments are after yours.  Also, if you miss three or more appointments without notifying the office, you may be dismissed from the clinic at the provider's discretion.      For prescription refill requests, have your pharmacy contact our office and allow 72 hours for refills to be completed.    Today you received the following chemotherapy and/or immunotherapy agents: bortezomib and daratumumab-hyaluronidase-fihj      To help prevent nausea and vomiting after your treatment, we encourage you to take your nausea medication as directed.  BELOW ARE SYMPTOMS THAT SHOULD BE REPORTED IMMEDIATELY: *FEVER GREATER THAN 100.4 F (38 C) OR HIGHER *CHILLS OR SWEATING *NAUSEA AND VOMITING THAT IS NOT CONTROLLED WITH YOUR NAUSEA MEDICATION *UNUSUAL SHORTNESS OF BREATH *UNUSUAL BRUISING OR BLEEDING *URINARY PROBLEMS (pain or burning when urinating, or frequent urination) *BOWEL PROBLEMS (unusual diarrhea, constipation, pain near the anus) TENDERNESS IN MOUTH AND THROAT WITH OR WITHOUT PRESENCE OF ULCERS (sore throat, sores in mouth, or a toothache) UNUSUAL RASH, SWELLING OR PAIN  UNUSUAL VAGINAL DISCHARGE OR ITCHING   Items with * indicate a potential emergency and should be followed up as soon as possible or go to the Emergency Department if any problems should occur.  Please show the  CHEMOTHERAPY ALERT CARD or IMMUNOTHERAPY ALERT CARD at check-in to the Emergency Department and triage nurse.  Should you have questions after your visit or need to cancel or reschedule your appointment, please contact [facility]  Dept: [phone]  and follow the prompts.  Office hours are 8:00 a.m. to 4:30 p.m. Monday - Friday. Please note that voicemails left after 4:00 p.m. may not be returned until the following business day.  We are closed weekends and major holidays. You have access to a nurse at all times for urgent questions. Please call the main number to the clinic Dept: [phone] and follow the prompts.   For any non-urgent questions, you may also contact your provider using MyChart. We now offer e-Visits for anyone 79 and older to request care online for non-urgent symptoms. For details visit [email or web address].   Also download the MyChart app! Go to the app store, search "MyChart", open the app, select [facility], and log in with your MyChart username and password.

## 2023-12-29 NOTE — Progress Notes (Signed)
 Patient reports he took steroids at home prior to arrival at clinic.

## 2023-12-31 ENCOUNTER — Other Ambulatory Visit: Payer: Self-pay | Admitting: Internal Medicine

## 2024-01-05 ENCOUNTER — Inpatient Hospital Stay: Admitting: Hematology and Oncology

## 2024-01-05 ENCOUNTER — Inpatient Hospital Stay

## 2024-01-05 ENCOUNTER — Encounter: Payer: Self-pay | Admitting: Hematology and Oncology

## 2024-01-05 VITALS — BP 155/78 | HR 81 | Temp 97.6°F | Resp 18 | Ht 75.0 in | Wt 238.2 lb

## 2024-01-05 DIAGNOSIS — C9000 Multiple myeloma not having achieved remission: Secondary | ICD-10-CM

## 2024-01-05 DIAGNOSIS — C3492 Malignant neoplasm of unspecified part of left bronchus or lung: Secondary | ICD-10-CM | POA: Diagnosis not present

## 2024-01-05 DIAGNOSIS — E1165 Type 2 diabetes mellitus with hyperglycemia: Secondary | ICD-10-CM | POA: Diagnosis not present

## 2024-01-05 DIAGNOSIS — N183 Chronic kidney disease, stage 3 unspecified: Secondary | ICD-10-CM

## 2024-01-05 DIAGNOSIS — Z5111 Encounter for antineoplastic chemotherapy: Secondary | ICD-10-CM | POA: Diagnosis not present

## 2024-01-05 DIAGNOSIS — Z5112 Encounter for antineoplastic immunotherapy: Secondary | ICD-10-CM | POA: Diagnosis not present

## 2024-01-05 DIAGNOSIS — Z85118 Personal history of other malignant neoplasm of bronchus and lung: Secondary | ICD-10-CM | POA: Diagnosis not present

## 2024-01-05 DIAGNOSIS — N1831 Chronic kidney disease, stage 3a: Secondary | ICD-10-CM | POA: Diagnosis not present

## 2024-01-05 DIAGNOSIS — Z794 Long term (current) use of insulin: Secondary | ICD-10-CM

## 2024-01-05 DIAGNOSIS — D539 Nutritional anemia, unspecified: Secondary | ICD-10-CM | POA: Diagnosis not present

## 2024-01-05 DIAGNOSIS — E1122 Type 2 diabetes mellitus with diabetic chronic kidney disease: Secondary | ICD-10-CM

## 2024-01-05 LAB — CBC WITH DIFFERENTIAL (CANCER CENTER ONLY)
Abs Immature Granulocytes: 0.03 K/uL (ref 0.00–0.07)
Basophils Absolute: 0 K/uL (ref 0.0–0.1)
Basophils Relative: 0 %
Eosinophils Absolute: 0 K/uL (ref 0.0–0.5)
Eosinophils Relative: 0 %
HCT: 28 % — ABNORMAL LOW (ref 39.0–52.0)
Hemoglobin: 9.3 g/dL — ABNORMAL LOW (ref 13.0–17.0)
Immature Granulocytes: 0 %
Lymphocytes Relative: 6 %
Lymphs Abs: 0.4 K/uL — ABNORMAL LOW (ref 0.7–4.0)
MCH: 30.7 pg (ref 26.0–34.0)
MCHC: 33.2 g/dL (ref 30.0–36.0)
MCV: 92.4 fL (ref 80.0–100.0)
Monocytes Absolute: 0.1 K/uL (ref 0.1–1.0)
Monocytes Relative: 2 %
Neutro Abs: 6.2 K/uL (ref 1.7–7.7)
Neutrophils Relative %: 92 %
Platelet Count: 189 K/uL (ref 150–400)
RBC: 3.03 MIL/uL — ABNORMAL LOW (ref 4.22–5.81)
RDW: 16.5 % — ABNORMAL HIGH (ref 11.5–15.5)
WBC Count: 6.8 K/uL (ref 4.0–10.5)
nRBC: 0 % (ref 0.0–0.2)

## 2024-01-05 LAB — CMP (CANCER CENTER ONLY)
ALT: 15 U/L (ref 0–44)
AST: 20 U/L (ref 15–41)
Albumin: 3.9 g/dL (ref 3.5–5.0)
Alkaline Phosphatase: 116 U/L (ref 38–126)
Anion gap: 5 (ref 5–15)
BUN: 16 mg/dL (ref 8–23)
CO2: 26 mmol/L (ref 22–32)
Calcium: 9.4 mg/dL (ref 8.9–10.3)
Chloride: 105 mmol/L (ref 98–111)
Creatinine: 1.34 mg/dL — ABNORMAL HIGH (ref 0.61–1.24)
GFR, Estimated: 57 mL/min — ABNORMAL LOW (ref >60)
Glucose, Bld: 216 mg/dL — ABNORMAL HIGH (ref 70–99)
Potassium: 4.8 mmol/L (ref 3.5–5.1)
Sodium: 136 mmol/L (ref 135–145)
Total Bilirubin: 0.4 mg/dL (ref 0.0–1.2)
Total Protein: 7.2 g/dL (ref 6.5–8.1)

## 2024-01-05 MED ORDER — BORTEZOMIB CHEMO SQ INJECTION 3.5 MG (2.5MG/ML)
1.3000 mg/m2 | Freq: Once | INTRAMUSCULAR | Status: AC
  Administered 2024-01-05: 3 mg via SUBCUTANEOUS
  Filled 2024-01-05: qty 1.2

## 2024-01-05 MED ORDER — DARATUMUMAB-HYALURONIDASE-FIHJ 1800-30000 MG-UT/15ML ~~LOC~~ SOLN
1800.0000 mg | Freq: Once | SUBCUTANEOUS | Status: AC
  Administered 2024-01-05: 1800 mg via SUBCUTANEOUS
  Filled 2024-01-05: qty 15

## 2024-01-05 MED ORDER — DIPHENHYDRAMINE HCL 25 MG PO CAPS
50.0000 mg | ORAL_CAPSULE | Freq: Once | ORAL | Status: AC
  Administered 2024-01-05: 50 mg via ORAL

## 2024-01-05 MED ORDER — ACETAMINOPHEN 325 MG PO TABS
650.0000 mg | ORAL_TABLET | Freq: Once | ORAL | Status: AC
  Administered 2024-01-05: 650 mg via ORAL

## 2024-01-05 MED ORDER — INSULIN GLARGINE 100 UNIT/ML ~~LOC~~ SOLN
13.0000 [IU] | Freq: Every day | SUBCUTANEOUS | Status: DC
Start: 2024-01-05 — End: 2024-02-02

## 2024-01-05 NOTE — Progress Notes (Signed)
 [facility] OFFICE PROGRESS NOTE  Patient Care Team: Jarold Medici, MD as PCP - General (Internal Medicine) [name], RN as Oncology Nurse Navigator  Assessment & Plan Multiple myeloma not having achieved remission [facility]) The patient have prior diagnosis of smoldering myeloma.  He was referred to Dr. 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 PET/CT imaging was reviewed with the patient and his wife which show no evidence of significant bone involvement/fractures  So far, he tolerated treatment well except for intermittent reflux and high blood sugar on the days that he takes treatment He will continue to take acyclovir  for antimicrobial prophylaxis We have received Zometa  recently I reviewed recent myeloma panel which show positive response to therapy I plan to start dexamethasone  taper once we get M protein less than 1 g We discussed the role of bone marrow transplant and for now, he is not keen for transplant evaluation Adenocarcinoma of left lung [facility]) The patient was diagnosed with stage II lung cancer in September 2024 He completed adjuvant chemotherapy by January 2025 Treatment course was complicated by significant side effects with anemia as well as diagnosis of pulmonary emboli PET/CT imaging from May 2025 showed excellent response to therapy I plan to repeat imaging study in August Type 2 diabetes mellitus with stage 3 chronic kidney disease, with long-term current use of insulin , unspecified whether stage 3a or 3b CKD (HCC) He has recurrent hypoglycemia at least twice a week with current dose of insulin  I recommend reducing insulin  to 13 units He will continue to  exercise and aggressive with low-carb diet CKD stage 3a, GFR 45-59 ml/min (HCC) Discussed importance of adequate fluid hydration Deficiency anemia He has multifactorial anemia, 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 He is not symptomatic Observe closely for now  Orders Placed This Encounter  Procedures   NM PET Image Restag (PS) Skull Base To Thigh    Standing Status:   Future    Expected Date:   01/25/2024    Expiration Date:   01/04/2025    If indicated for the ordered procedure, I authorize the administration of a radiopharmaceutical per Radiology protocol:   Yes    Preferred imaging location?:   [facility] Almarie Lonn, MD  INTERVAL HISTORY: he returns for treatment follow-up for multiple myeloma.  He is also being follow-up for early stage lung cancer Complications related to previous cycle of chemotherapy included anemia, and fatigue, Overall he tolerated treatment well No recent bleeding No new side effects of treatment We have long discussion about plan of care for multiple myeloma.  We also discussed timing of imaging study for his lung cancer  PHYSICAL EXAMINATION: ECOG PERFORMANCE STATUS: 1 - Symptomatic but completely ambulatory  No results found for: CAN125    Latest Ref Rng & Units 01/05/2024   10:59 AM 12/29/2023    8:42 AM 12/22/2023   11:09 AM  CBC  WBC 4.0 - 10.5 K/uL 6.8  5.3  8.4   Hemoglobin 13.0 - 17.0 g/dL 9.3  9.2  9.3   Hematocrit 39.0 - 52.0 % 28.0  28.0  28.1   Platelets 150 - 400 K/uL 189  171  180  Chemistry      Component Value Date/Time   NA 136 01/05/2024 1059   NA 137 11/05/2022 1152   K 4.8 01/05/2024 1059   CL 105 01/05/2024 1059   CO2 26 01/05/2024 1059   BUN 16 01/05/2024 1059   BUN 11 11/05/2022 1152   CREATININE 1.34 (H) 01/05/2024 1059      Component Value Date/Time   CALCIUM  9.4 01/05/2024 1059   ALKPHOS 116 01/05/2024 1059   AST 20 01/05/2024 1059   ALT 15 01/05/2024 1059   BILITOT 0.4  01/05/2024 1059       Vitals:   01/05/24 1130  BP: (!) 155/78  Pulse: 81  Resp: 18  Temp: 97.6 F (36.4 C)  SpO2: 100%   Filed Weights   01/05/24 1130  Weight: 238 lb 3.2 oz (108 kg)   Other relevant data reviewed during this visit included CBC, CMP, recent myeloma panel

## 2024-01-05 NOTE — Assessment & Plan Note (Addendum)
 The patient was diagnosed with stage II lung cancer in September 2024 He completed adjuvant chemotherapy by January 2025 Treatment course was complicated by significant side effects with anemia as well as diagnosis of pulmonary emboli PET/CT imaging from May 2025 showed excellent response to therapy I plan to repeat imaging study in August

## 2024-01-05 NOTE — Assessment & Plan Note (Addendum)
 Discussed importance of adequate fluid hydration

## 2024-01-05 NOTE — Assessment & Plan Note (Addendum)
 He has multifactorial anemia, 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 He is not symptomatic Observe closely for now

## 2024-01-05 NOTE — Assessment & Plan Note (Addendum)
 The patient have prior diagnosis of smoldering myeloma.  He was referred to Dr. 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 PET/CT imaging was reviewed with the patient and his wife which show no evidence of significant bone involvement/fractures  So far, he tolerated treatment well except for intermittent reflux and high blood sugar on the days that he takes treatment He will continue to take acyclovir  for antimicrobial prophylaxis We have received Zometa  recently I reviewed recent myeloma panel which show positive response to therapy I plan to start dexamethasone  taper once we get M protein less than 1 g We discussed the role of bone marrow transplant and for now, he is not keen for transplant evaluation

## 2024-01-05 NOTE — Patient Instructions (Signed)
 [facility] - A DEPT OF [facility]  Discharge Instructions: Thank you for choosing [facility] to provide your oncology and hematology care.   If you have a lab appointment with the Cancer Center, please go directly to the Cancer Center and check in at the registration area.   Wear comfortable clothing and clothing appropriate for easy access to any Portacath or PICC line.   We strive to give you quality time with your provider. You may need to reschedule your appointment if you arrive late (15 or more minutes).  Arriving late affects you and other patients whose appointments are after yours.  Also, if you miss three or more appointments without notifying the office, you may be dismissed from the clinic at the provider's discretion.      For prescription refill requests, have your pharmacy contact our office and allow 72 hours for refills to be completed.    Today you received the following chemotherapy and/or immunotherapy agents: bortezomib and daratumumab-hyaluronidase-fihj      To help prevent nausea and vomiting after your treatment, we encourage you to take your nausea medication as directed.  BELOW ARE SYMPTOMS THAT SHOULD BE REPORTED IMMEDIATELY: *FEVER GREATER THAN 100.4 F (38 C) OR HIGHER *CHILLS OR SWEATING *NAUSEA AND VOMITING THAT IS NOT CONTROLLED WITH YOUR NAUSEA MEDICATION *UNUSUAL SHORTNESS OF BREATH *UNUSUAL BRUISING OR BLEEDING *URINARY PROBLEMS (pain or burning when urinating, or frequent urination) *BOWEL PROBLEMS (unusual diarrhea, constipation, pain near the anus) TENDERNESS IN MOUTH AND THROAT WITH OR WITHOUT PRESENCE OF ULCERS (sore throat, sores in mouth, or a toothache) UNUSUAL RASH, SWELLING OR PAIN  UNUSUAL VAGINAL DISCHARGE OR ITCHING   Items with * indicate a potential emergency and should be followed up as soon as possible or go to the Emergency Department if any problems should occur.  Please show the  CHEMOTHERAPY ALERT CARD or IMMUNOTHERAPY ALERT CARD at check-in to the Emergency Department and triage nurse.  Should you have questions after your visit or need to cancel or reschedule your appointment, please contact [facility]  Dept: [phone]  and follow the prompts.  Office hours are 8:00 a.m. to 4:30 p.m. Monday - Friday. Please note that voicemails left after 4:00 p.m. may not be returned until the following business day.  We are closed weekends and major holidays. You have access to a nurse at all times for urgent questions. Please call the main number to the clinic Dept: [phone] and follow the prompts.   For any non-urgent questions, you may also contact your provider using MyChart. We now offer e-Visits for anyone 79 and older to request care online for non-urgent symptoms. For details visit [email or web address].   Also download the MyChart app! Go to the app store, search "MyChart", open the app, select [facility], and log in with your MyChart username and password.

## 2024-01-05 NOTE — Assessment & Plan Note (Addendum)
 He has recurrent hypoglycemia at least twice a week with current dose of insulin  I recommend reducing insulin  to 13 units He will continue to exercise and aggressive with low-carb diet

## 2024-01-11 ENCOUNTER — Inpatient Hospital Stay

## 2024-01-12 ENCOUNTER — Inpatient Hospital Stay

## 2024-01-12 VITALS — BP 140/66 | HR 73 | Temp 98.8°F | Resp 16 | Wt 239.8 lb

## 2024-01-12 DIAGNOSIS — Z5111 Encounter for antineoplastic chemotherapy: Secondary | ICD-10-CM | POA: Diagnosis not present

## 2024-01-12 DIAGNOSIS — N1831 Chronic kidney disease, stage 3a: Secondary | ICD-10-CM | POA: Diagnosis not present

## 2024-01-12 DIAGNOSIS — D539 Nutritional anemia, unspecified: Secondary | ICD-10-CM | POA: Diagnosis not present

## 2024-01-12 DIAGNOSIS — C9000 Multiple myeloma not having achieved remission: Secondary | ICD-10-CM

## 2024-01-12 DIAGNOSIS — E1122 Type 2 diabetes mellitus with diabetic chronic kidney disease: Secondary | ICD-10-CM | POA: Diagnosis not present

## 2024-01-12 DIAGNOSIS — Z85118 Personal history of other malignant neoplasm of bronchus and lung: Secondary | ICD-10-CM | POA: Diagnosis not present

## 2024-01-12 DIAGNOSIS — E1165 Type 2 diabetes mellitus with hyperglycemia: Secondary | ICD-10-CM | POA: Diagnosis not present

## 2024-01-12 DIAGNOSIS — Z5112 Encounter for antineoplastic immunotherapy: Secondary | ICD-10-CM | POA: Diagnosis not present

## 2024-01-12 LAB — CBC WITH DIFFERENTIAL (CANCER CENTER ONLY)
Abs Immature Granulocytes: 0.03 K/uL (ref 0.00–0.07)
Basophils Absolute: 0 K/uL (ref 0.0–0.1)
Basophils Relative: 0 %
Eosinophils Absolute: 0.1 K/uL (ref 0.0–0.5)
Eosinophils Relative: 1 %
HCT: 28.6 % — ABNORMAL LOW (ref 39.0–52.0)
Hemoglobin: 9.5 g/dL — ABNORMAL LOW (ref 13.0–17.0)
Immature Granulocytes: 0 %
Lymphocytes Relative: 11 %
Lymphs Abs: 0.8 K/uL (ref 0.7–4.0)
MCH: 31 pg (ref 26.0–34.0)
MCHC: 33.2 g/dL (ref 30.0–36.0)
MCV: 93.5 fL (ref 80.0–100.0)
Monocytes Absolute: 0.4 K/uL (ref 0.1–1.0)
Monocytes Relative: 6 %
Neutro Abs: 6.2 K/uL (ref 1.7–7.7)
Neutrophils Relative %: 82 %
Platelet Count: 174 K/uL (ref 150–400)
RBC: 3.06 MIL/uL — ABNORMAL LOW (ref 4.22–5.81)
RDW: 16.2 % — ABNORMAL HIGH (ref 11.5–15.5)
WBC Count: 7.5 K/uL (ref 4.0–10.5)
nRBC: 0 % (ref 0.0–0.2)

## 2024-01-12 LAB — CMP (CANCER CENTER ONLY)
ALT: 12 U/L (ref 0–44)
AST: 18 U/L (ref 15–41)
Albumin: 3.8 g/dL (ref 3.5–5.0)
Alkaline Phosphatase: 112 U/L (ref 38–126)
Anion gap: 4 — ABNORMAL LOW (ref 5–15)
BUN: 19 mg/dL (ref 8–23)
CO2: 27 mmol/L (ref 22–32)
Calcium: 8.9 mg/dL (ref 8.9–10.3)
Chloride: 105 mmol/L (ref 98–111)
Creatinine: 1.41 mg/dL — ABNORMAL HIGH (ref 0.61–1.24)
GFR, Estimated: 54 mL/min — ABNORMAL LOW (ref >60)
Glucose, Bld: 149 mg/dL — ABNORMAL HIGH (ref 70–99)
Potassium: 4.7 mmol/L (ref 3.5–5.1)
Sodium: 136 mmol/L (ref 135–145)
Total Bilirubin: 0.4 mg/dL (ref 0.0–1.2)
Total Protein: 6.8 g/dL (ref 6.5–8.1)

## 2024-01-12 MED ORDER — DIPHENHYDRAMINE HCL 25 MG PO CAPS
50.0000 mg | ORAL_CAPSULE | Freq: Once | ORAL | Status: AC
  Administered 2024-01-12: 50 mg via ORAL
  Filled 2024-01-12: qty 2

## 2024-01-12 MED ORDER — BORTEZOMIB CHEMO SQ INJECTION 3.5 MG (2.5MG/ML)
1.3000 mg/m2 | Freq: Once | INTRAMUSCULAR | Status: AC
  Administered 2024-01-12: 3 mg via SUBCUTANEOUS
  Filled 2024-01-12: qty 1.2

## 2024-01-12 MED ORDER — DARATUMUMAB-HYALURONIDASE-FIHJ 1800-30000 MG-UT/15ML ~~LOC~~ SOLN
1800.0000 mg | Freq: Once | SUBCUTANEOUS | Status: AC
  Administered 2024-01-12: 1800 mg via SUBCUTANEOUS
  Filled 2024-01-12: qty 15

## 2024-01-12 MED ORDER — ACETAMINOPHEN 325 MG PO TABS
650.0000 mg | ORAL_TABLET | Freq: Once | ORAL | Status: AC
  Administered 2024-01-12: 650 mg via ORAL
  Filled 2024-01-12: qty 2

## 2024-01-12 NOTE — Progress Notes (Signed)
 Patient took his steroid at home today.

## 2024-01-12 NOTE — Patient Instructions (Signed)
 [facility] - A DEPT OF [facility]  Discharge Instructions: Thank you for choosing [facility] to provide your oncology and hematology care.   If you have a lab appointment with the Cancer Center, please go directly to the Cancer Center and check in at the registration area.   Wear comfortable clothing and clothing appropriate for easy access to any Portacath or PICC line.   We strive to give you quality time with your provider. You may need to reschedule your appointment if you arrive late (15 or more minutes).  Arriving late affects you and other patients whose appointments are after yours.  Also, if you miss three or more appointments without notifying the office, you may be dismissed from the clinic at the provider's discretion.      For prescription refill requests, have your pharmacy contact our office and allow 72 hours for refills to be completed.    Today you received the following chemotherapy and/or immunotherapy agents: bortezomib and daratumumab-hyaluronidase-fihj      To help prevent nausea and vomiting after your treatment, we encourage you to take your nausea medication as directed.  BELOW ARE SYMPTOMS THAT SHOULD BE REPORTED IMMEDIATELY: *FEVER GREATER THAN 100.4 F (38 C) OR HIGHER *CHILLS OR SWEATING *NAUSEA AND VOMITING THAT IS NOT CONTROLLED WITH YOUR NAUSEA MEDICATION *UNUSUAL SHORTNESS OF BREATH *UNUSUAL BRUISING OR BLEEDING *URINARY PROBLEMS (pain or burning when urinating, or frequent urination) *BOWEL PROBLEMS (unusual diarrhea, constipation, pain near the anus) TENDERNESS IN MOUTH AND THROAT WITH OR WITHOUT PRESENCE OF ULCERS (sore throat, sores in mouth, or a toothache) UNUSUAL RASH, SWELLING OR PAIN  UNUSUAL VAGINAL DISCHARGE OR ITCHING   Items with * indicate a potential emergency and should be followed up as soon as possible or go to the Emergency Department if any problems should occur.  Please show the  CHEMOTHERAPY ALERT CARD or IMMUNOTHERAPY ALERT CARD at check-in to the Emergency Department and triage nurse.  Should you have questions after your visit or need to cancel or reschedule your appointment, please contact [facility]  Dept: [phone]  and follow the prompts.  Office hours are 8:00 a.m. to 4:30 p.m. Monday - Friday. Please note that voicemails left after 4:00 p.m. may not be returned until the following business day.  We are closed weekends and major holidays. You have access to a nurse at all times for urgent questions. Please call the main number to the clinic Dept: [phone] and follow the prompts.   For any non-urgent questions, you may also contact your provider using MyChart. We now offer e-Visits for anyone 79 and older to request care online for non-urgent symptoms. For details visit [email or web address].   Also download the MyChart app! Go to the app store, search "MyChart", open the app, select [facility], and log in with your MyChart username and password.

## 2024-01-19 ENCOUNTER — Inpatient Hospital Stay

## 2024-01-19 ENCOUNTER — Other Ambulatory Visit: Payer: Self-pay | Admitting: Hematology and Oncology

## 2024-01-19 ENCOUNTER — Ambulatory Visit: Admitting: Hematology and Oncology

## 2024-01-19 VITALS — BP 146/78 | HR 66 | Temp 98.0°F | Resp 16 | Wt 242.5 lb

## 2024-01-19 DIAGNOSIS — Z5111 Encounter for antineoplastic chemotherapy: Secondary | ICD-10-CM | POA: Diagnosis not present

## 2024-01-19 DIAGNOSIS — Z5112 Encounter for antineoplastic immunotherapy: Secondary | ICD-10-CM | POA: Diagnosis not present

## 2024-01-19 DIAGNOSIS — E1122 Type 2 diabetes mellitus with diabetic chronic kidney disease: Secondary | ICD-10-CM | POA: Diagnosis not present

## 2024-01-19 DIAGNOSIS — N1831 Chronic kidney disease, stage 3a: Secondary | ICD-10-CM | POA: Diagnosis not present

## 2024-01-19 DIAGNOSIS — D539 Nutritional anemia, unspecified: Secondary | ICD-10-CM | POA: Diagnosis not present

## 2024-01-19 DIAGNOSIS — C9000 Multiple myeloma not having achieved remission: Secondary | ICD-10-CM

## 2024-01-19 DIAGNOSIS — Z85118 Personal history of other malignant neoplasm of bronchus and lung: Secondary | ICD-10-CM | POA: Diagnosis not present

## 2024-01-19 DIAGNOSIS — E1165 Type 2 diabetes mellitus with hyperglycemia: Secondary | ICD-10-CM | POA: Diagnosis not present

## 2024-01-19 LAB — CBC WITH DIFFERENTIAL (CANCER CENTER ONLY)
Abs Immature Granulocytes: 0.05 K/uL (ref 0.00–0.07)
Basophils Absolute: 0 K/uL (ref 0.0–0.1)
Basophils Relative: 0 %
Eosinophils Absolute: 0 K/uL (ref 0.0–0.5)
Eosinophils Relative: 0 %
HCT: 28.6 % — ABNORMAL LOW (ref 39.0–52.0)
Hemoglobin: 9.5 g/dL — ABNORMAL LOW (ref 13.0–17.0)
Immature Granulocytes: 1 %
Lymphocytes Relative: 9 %
Lymphs Abs: 0.8 K/uL (ref 0.7–4.0)
MCH: 30.7 pg (ref 26.0–34.0)
MCHC: 33.2 g/dL (ref 30.0–36.0)
MCV: 92.6 fL (ref 80.0–100.0)
Monocytes Absolute: 0.4 K/uL (ref 0.1–1.0)
Monocytes Relative: 4 %
Neutro Abs: 7.5 K/uL (ref 1.7–7.7)
Neutrophils Relative %: 86 %
Platelet Count: 162 K/uL (ref 150–400)
RBC: 3.09 MIL/uL — ABNORMAL LOW (ref 4.22–5.81)
RDW: 16 % — ABNORMAL HIGH (ref 11.5–15.5)
WBC Count: 8.8 K/uL (ref 4.0–10.5)
nRBC: 0 % (ref 0.0–0.2)

## 2024-01-19 LAB — CMP (CANCER CENTER ONLY)
ALT: 16 U/L (ref 0–44)
AST: 19 U/L (ref 15–41)
Albumin: 3.8 g/dL (ref 3.5–5.0)
Alkaline Phosphatase: 108 U/L (ref 38–126)
Anion gap: 5 (ref 5–15)
BUN: 20 mg/dL (ref 8–23)
CO2: 26 mmol/L (ref 22–32)
Calcium: 9 mg/dL (ref 8.9–10.3)
Chloride: 105 mmol/L (ref 98–111)
Creatinine: 1.43 mg/dL — ABNORMAL HIGH (ref 0.61–1.24)
GFR, Estimated: 53 mL/min — ABNORMAL LOW (ref >60)
Glucose, Bld: 127 mg/dL — ABNORMAL HIGH (ref 70–99)
Potassium: 4.6 mmol/L (ref 3.5–5.1)
Sodium: 136 mmol/L (ref 135–145)
Total Bilirubin: 0.4 mg/dL (ref 0.0–1.2)
Total Protein: 7.1 g/dL (ref 6.5–8.1)

## 2024-01-19 MED ORDER — DARATUMUMAB-HYALURONIDASE-FIHJ 1800-30000 MG-UT/15ML ~~LOC~~ SOLN
1800.0000 mg | Freq: Once | SUBCUTANEOUS | Status: AC
  Administered 2024-01-19: 1800 mg via SUBCUTANEOUS
  Filled 2024-01-19: qty 15

## 2024-01-19 MED ORDER — ACETAMINOPHEN 325 MG PO TABS
650.0000 mg | ORAL_TABLET | Freq: Once | ORAL | Status: AC
  Administered 2024-01-19: 650 mg via ORAL
  Filled 2024-01-19: qty 2

## 2024-01-19 MED ORDER — BORTEZOMIB CHEMO SQ INJECTION 3.5 MG (2.5MG/ML)
1.3000 mg/m2 | Freq: Once | INTRAMUSCULAR | Status: AC
  Administered 2024-01-19: 3 mg via SUBCUTANEOUS
  Filled 2024-01-19: qty 1.2

## 2024-01-19 MED ORDER — DIPHENHYDRAMINE HCL 25 MG PO CAPS
50.0000 mg | ORAL_CAPSULE | Freq: Once | ORAL | Status: AC
  Administered 2024-01-19: 50 mg via ORAL
  Filled 2024-01-19: qty 2

## 2024-01-19 NOTE — Patient Instructions (Signed)
 [facility] - A DEPT OF [facility]  Discharge Instructions: Thank you for choosing [facility] to provide your oncology and hematology care.   If you have a lab appointment with the Cancer Center, please go directly to the Cancer Center and check in at the registration area.   Wear comfortable clothing and clothing appropriate for easy access to any Portacath or PICC line.   We strive to give you quality time with your provider. You may need to reschedule your appointment if you arrive late (15 or more minutes).  Arriving late affects you and other patients whose appointments are after yours.  Also, if you miss three or more appointments without notifying the office, you may be dismissed from the clinic at the provider's discretion.      For prescription refill requests, have your pharmacy contact our office and allow 72 hours for refills to be completed.    Today you received the following chemotherapy and/or immunotherapy agents: bortezomib and daratumumab-hyaluronidase-fihj      To help prevent nausea and vomiting after your treatment, we encourage you to take your nausea medication as directed.  BELOW ARE SYMPTOMS THAT SHOULD BE REPORTED IMMEDIATELY: *FEVER GREATER THAN 100.4 F (38 C) OR HIGHER *CHILLS OR SWEATING *NAUSEA AND VOMITING THAT IS NOT CONTROLLED WITH YOUR NAUSEA MEDICATION *UNUSUAL SHORTNESS OF BREATH *UNUSUAL BRUISING OR BLEEDING *URINARY PROBLEMS (pain or burning when urinating, or frequent urination) *BOWEL PROBLEMS (unusual diarrhea, constipation, pain near the anus) TENDERNESS IN MOUTH AND THROAT WITH OR WITHOUT PRESENCE OF ULCERS (sore throat, sores in mouth, or a toothache) UNUSUAL RASH, SWELLING OR PAIN  UNUSUAL VAGINAL DISCHARGE OR ITCHING   Items with * indicate a potential emergency and should be followed up as soon as possible or go to the Emergency Department if any problems should occur.  Please show the  CHEMOTHERAPY ALERT CARD or IMMUNOTHERAPY ALERT CARD at check-in to the Emergency Department and triage nurse.  Should you have questions after your visit or need to cancel or reschedule your appointment, please contact [facility]  Dept: [phone]  and follow the prompts.  Office hours are 8:00 a.m. to 4:30 p.m. Monday - Friday. Please note that voicemails left after 4:00 p.m. may not be returned until the following business day.  We are closed weekends and major holidays. You have access to a nurse at all times for urgent questions. Please call the main number to the clinic Dept: [phone] and follow the prompts.   For any non-urgent questions, you may also contact your provider using MyChart. We now offer e-Visits for anyone 79 and older to request care online for non-urgent symptoms. For details visit [email or web address].   Also download the MyChart app! Go to the app store, search "MyChart", open the app, select [facility], and log in with your MyChart username and password.

## 2024-01-19 NOTE — Progress Notes (Signed)
 Patient took his steroid at home today.

## 2024-01-22 ENCOUNTER — Encounter: Payer: Self-pay | Admitting: Hematology and Oncology

## 2024-01-25 ENCOUNTER — Ambulatory Visit (HOSPITAL_COMMUNITY): Admission: RE | Admit: 2024-01-25 | Source: Ambulatory Visit

## 2024-01-26 ENCOUNTER — Inpatient Hospital Stay: Attending: Hematology and Oncology

## 2024-01-26 ENCOUNTER — Inpatient Hospital Stay

## 2024-01-26 VITALS — BP 140/68 | HR 68 | Temp 98.2°F | Resp 16 | Wt 248.5 lb

## 2024-01-26 DIAGNOSIS — Z5112 Encounter for antineoplastic immunotherapy: Secondary | ICD-10-CM | POA: Insufficient documentation

## 2024-01-26 DIAGNOSIS — Z85118 Personal history of other malignant neoplasm of bronchus and lung: Secondary | ICD-10-CM | POA: Diagnosis not present

## 2024-01-26 DIAGNOSIS — D6481 Anemia due to antineoplastic chemotherapy: Secondary | ICD-10-CM | POA: Insufficient documentation

## 2024-01-26 DIAGNOSIS — Z5111 Encounter for antineoplastic chemotherapy: Secondary | ICD-10-CM | POA: Insufficient documentation

## 2024-01-26 DIAGNOSIS — N183 Chronic kidney disease, stage 3 unspecified: Secondary | ICD-10-CM | POA: Insufficient documentation

## 2024-01-26 DIAGNOSIS — C9000 Multiple myeloma not having achieved remission: Secondary | ICD-10-CM

## 2024-01-26 DIAGNOSIS — D539 Nutritional anemia, unspecified: Secondary | ICD-10-CM | POA: Insufficient documentation

## 2024-01-26 DIAGNOSIS — E1165 Type 2 diabetes mellitus with hyperglycemia: Secondary | ICD-10-CM | POA: Diagnosis not present

## 2024-01-26 DIAGNOSIS — E1122 Type 2 diabetes mellitus with diabetic chronic kidney disease: Secondary | ICD-10-CM | POA: Diagnosis not present

## 2024-01-26 LAB — CBC WITH DIFFERENTIAL (CANCER CENTER ONLY)
Abs Immature Granulocytes: 0.04 K/uL (ref 0.00–0.07)
Basophils Absolute: 0 K/uL (ref 0.0–0.1)
Basophils Relative: 0 %
Eosinophils Absolute: 0.1 K/uL (ref 0.0–0.5)
Eosinophils Relative: 1 %
HCT: 29.2 % — ABNORMAL LOW (ref 39.0–52.0)
Hemoglobin: 9.5 g/dL — ABNORMAL LOW (ref 13.0–17.0)
Immature Granulocytes: 1 %
Lymphocytes Relative: 13 %
Lymphs Abs: 0.9 K/uL (ref 0.7–4.0)
MCH: 30.3 pg (ref 26.0–34.0)
MCHC: 32.5 g/dL (ref 30.0–36.0)
MCV: 93 fL (ref 80.0–100.0)
Monocytes Absolute: 0.6 K/uL (ref 0.1–1.0)
Monocytes Relative: 9 %
Neutro Abs: 5.7 K/uL (ref 1.7–7.7)
Neutrophils Relative %: 76 %
Platelet Count: 162 K/uL (ref 150–400)
RBC: 3.14 MIL/uL — ABNORMAL LOW (ref 4.22–5.81)
RDW: 16 % — ABNORMAL HIGH (ref 11.5–15.5)
WBC Count: 7.4 K/uL (ref 4.0–10.5)
nRBC: 0 % (ref 0.0–0.2)

## 2024-01-26 LAB — CMP (CANCER CENTER ONLY)
ALT: 13 U/L (ref 0–44)
AST: 18 U/L (ref 15–41)
Albumin: 4 g/dL (ref 3.5–5.0)
Alkaline Phosphatase: 104 U/L (ref 38–126)
Anion gap: 4 — ABNORMAL LOW (ref 5–15)
BUN: 19 mg/dL (ref 8–23)
CO2: 28 mmol/L (ref 22–32)
Calcium: 8.7 mg/dL — ABNORMAL LOW (ref 8.9–10.3)
Chloride: 105 mmol/L (ref 98–111)
Creatinine: 1.38 mg/dL — ABNORMAL HIGH (ref 0.61–1.24)
GFR, Estimated: 55 mL/min — ABNORMAL LOW (ref >60)
Glucose, Bld: 113 mg/dL — ABNORMAL HIGH (ref 70–99)
Potassium: 4.1 mmol/L (ref 3.5–5.1)
Sodium: 137 mmol/L (ref 135–145)
Total Bilirubin: 0.5 mg/dL (ref 0.0–1.2)
Total Protein: 6.7 g/dL (ref 6.5–8.1)

## 2024-01-26 MED ORDER — DARATUMUMAB-HYALURONIDASE-FIHJ 1800-30000 MG-UT/15ML ~~LOC~~ SOLN
1800.0000 mg | Freq: Once | SUBCUTANEOUS | Status: AC
  Administered 2024-01-26: 1800 mg via SUBCUTANEOUS
  Filled 2024-01-26: qty 15

## 2024-01-26 MED ORDER — ACETAMINOPHEN 325 MG PO TABS
650.0000 mg | ORAL_TABLET | Freq: Once | ORAL | Status: AC
  Administered 2024-01-26: 650 mg via ORAL
  Filled 2024-01-26: qty 2

## 2024-01-26 MED ORDER — DIPHENHYDRAMINE HCL 25 MG PO CAPS
50.0000 mg | ORAL_CAPSULE | Freq: Once | ORAL | Status: AC
  Administered 2024-01-26: 50 mg via ORAL
  Filled 2024-01-26: qty 2

## 2024-01-26 MED ORDER — BORTEZOMIB CHEMO SQ INJECTION 3.5 MG (2.5MG/ML)
1.3000 mg/m2 | Freq: Once | INTRAMUSCULAR | Status: AC
  Administered 2024-01-26: 3 mg via SUBCUTANEOUS
  Filled 2024-01-26: qty 1.2

## 2024-01-26 NOTE — Patient Instructions (Signed)
 [facility]  Discharge Instructions: Thank you for choosing [facility] to provide your oncology and hematology care.   If you have a lab appointment with the Cancer Center, please go directly to the Cancer Center and check in at the registration area.   Wear comfortable clothing and clothing appropriate for easy access to any Portacath or PICC line.   We strive to give you quality time with your provider. You may need to reschedule your appointment if you arrive late (15 or more minutes).  Arriving late affects you and other patients whose appointments are after yours.  Also, if you miss three or more appointments without notifying the office, you may be dismissed from the clinic at the provider's discretion.      For prescription refill requests, have your pharmacy contact our office and allow 72 hours for refills to be completed.    Today you received the following chemotherapy and/or immunotherapy agents Velcade, Darzalex Faspro.    To help prevent nausea and vomiting after your treatment, we encourage you to take your nausea medication as directed.  BELOW ARE SYMPTOMS THAT SHOULD BE REPORTED IMMEDIATELY: *FEVER GREATER THAN 100.4 F (38 C) OR HIGHER *CHILLS OR SWEATING *NAUSEA AND VOMITING THAT IS NOT CONTROLLED WITH YOUR NAUSEA MEDICATION *UNUSUAL SHORTNESS OF BREATH *UNUSUAL BRUISING OR BLEEDING *URINARY PROBLEMS (pain or burning when urinating, or frequent urination) *BOWEL PROBLEMS (unusual diarrhea, constipation, pain near the anus) TENDERNESS IN MOUTH AND THROAT WITH OR WITHOUT PRESENCE OF ULCERS (sore throat, sores in mouth, or a toothache) UNUSUAL RASH, SWELLING OR PAIN  UNUSUAL VAGINAL DISCHARGE OR ITCHING   Items with * indicate a potential emergency and should be followed up as soon as possible or go to the Emergency Department if any problems should occur.  Please show the CHEMOTHERAPY ALERT CARD or  IMMUNOTHERAPY ALERT CARD at check-in to the Emergency Department and triage nurse.  Should you have questions after your visit or need to cancel or reschedule your appointment, please contact [facility]  Dept: [phone]  and follow the prompts.  Office hours are 8:00 a.m. to 4:30 p.m. Monday - Friday. Please note that voicemails left after 4:00 p.m. may not be returned until the following business day.  We are closed weekends and major holidays. You have access to a nurse at all times for urgent questions. Please call the main number to the clinic Dept: [phone] and follow the prompts.   For any non-urgent questions, you may also contact your provider using MyChart. We now offer e-Visits for anyone 86 and older to request care online for non-urgent symptoms. For details visit [email or web address].   Also download the MyChart app! Go to the app store, search "MyChart", open the app, select [facility], and log in with your MyChart username and password.

## 2024-01-26 NOTE — Progress Notes (Signed)
 Patient states he took dexamethasone  at home today.  Patient reports intermittent +1 edema in LLE x several weeks.  MD aware, OK to proceed with Tx.  Patient is on anticoagulation therapy.

## 2024-01-27 ENCOUNTER — Other Ambulatory Visit: Payer: Self-pay | Admitting: Hematology and Oncology

## 2024-01-27 ENCOUNTER — Ambulatory Visit (HOSPITAL_COMMUNITY)
Admission: RE | Admit: 2024-01-27 | Discharge: 2024-01-27 | Disposition: A | Discharge status: Home / Self Care | Source: Ambulatory Visit | Attending: Hematology and Oncology | Admitting: Hematology and Oncology

## 2024-01-27 ENCOUNTER — Encounter (HOSPITAL_COMMUNITY): Payer: Self-pay

## 2024-01-27 DIAGNOSIS — R918 Other nonspecific abnormal finding of lung field: Secondary | ICD-10-CM | POA: Diagnosis not present

## 2024-01-27 DIAGNOSIS — C9000 Multiple myeloma not having achieved remission: Secondary | ICD-10-CM | POA: Insufficient documentation

## 2024-01-27 DIAGNOSIS — C3492 Malignant neoplasm of unspecified part of left bronchus or lung: Secondary | ICD-10-CM

## 2024-01-27 LAB — KAPPA/LAMBDA LIGHT CHAINS
Kappa free light chain: 45.4 mg/L — ABNORMAL HIGH (ref 3.3–19.4)
Kappa, lambda light chain ratio: 12.61 — ABNORMAL HIGH (ref 0.26–1.65)
Lambda free light chains: 3.6 mg/L — ABNORMAL LOW (ref 5.7–26.3)

## 2024-01-27 LAB — GLUCOSE, CAPILLARY: Glucose-Capillary: 117 mg/dL — ABNORMAL HIGH (ref 70–99)

## 2024-01-27 MED ORDER — FLUDEOXYGLUCOSE F - 18 (FDG) INJECTION
12.2500 | Freq: Once | INTRAVENOUS | Status: AC
  Administered 2024-01-27: 12.25 via INTRAVENOUS

## 2024-01-28 ENCOUNTER — Ambulatory Visit (INDEPENDENT_AMBULATORY_CARE_PROVIDER_SITE_OTHER): Admitting: Internal Medicine

## 2024-01-28 ENCOUNTER — Encounter: Payer: Self-pay | Admitting: Internal Medicine

## 2024-01-28 VITALS — BP 128/70 | HR 67 | Temp 97.6°F | Ht 75.0 in | Wt 251.2 lb

## 2024-01-28 DIAGNOSIS — E1122 Type 2 diabetes mellitus with diabetic chronic kidney disease: Secondary | ICD-10-CM | POA: Diagnosis not present

## 2024-01-28 DIAGNOSIS — M25421 Effusion, right elbow: Secondary | ICD-10-CM

## 2024-01-28 DIAGNOSIS — Z794 Long term (current) use of insulin: Secondary | ICD-10-CM

## 2024-01-28 DIAGNOSIS — I129 Hypertensive chronic kidney disease with stage 1 through stage 4 chronic kidney disease, or unspecified chronic kidney disease: Secondary | ICD-10-CM

## 2024-01-28 DIAGNOSIS — R600 Localized edema: Secondary | ICD-10-CM | POA: Diagnosis not present

## 2024-01-28 DIAGNOSIS — E66811 Obesity, class 1: Secondary | ICD-10-CM

## 2024-01-28 DIAGNOSIS — C3492 Malignant neoplasm of unspecified part of left bronchus or lung: Secondary | ICD-10-CM

## 2024-01-28 DIAGNOSIS — N182 Chronic kidney disease, stage 2 (mild): Secondary | ICD-10-CM | POA: Diagnosis not present

## 2024-01-28 DIAGNOSIS — D6869 Other thrombophilia: Secondary | ICD-10-CM

## 2024-01-28 DIAGNOSIS — C9000 Multiple myeloma not having achieved remission: Secondary | ICD-10-CM

## 2024-01-28 NOTE — Patient Instructions (Signed)

## 2024-01-28 NOTE — Progress Notes (Signed)
 I,Victoria T Emmitt, CMA,acting as a Neurosurgeon for William LOISE Slocumb, MD.,have documented all relevant documentation on the behalf of William LOISE Slocumb, MD,as directed by  William LOISE Slocumb, MD while in the presence of William LOISE Slocumb, MD.  Subjective:  Patient ID: William Mcguire , male    DOB: 03/28/1956 , 68 y.o.   MRN: [id number]  Chief Complaint  Patient presents with   Diabetes    Patient presents today for dm & bpc. He reports compliance with medications. Denies headache, chest pain & sob. He reports noticing off/on swelling for 2-3 weeks. Denies any pain.    Hypertension    HPI Discussed the use of AI scribe software for clinical note transcription with the patient, who gave verbal consent to proceed.  History of Present Illness [name] is a 68 year old male with diabetes and hypertension who presents for a routine follow-up.  He feels good overall, noting that his diabetes and blood pressure are well-controlled. He is on Trulicity  1.5 mg weekly, Eliquis  twice a day, Enalapril  10 mg daily, and Lantus  13 units at night. His morning blood sugars are around 113 mg/dL, and he uses a Dexcom G7 for continuous glucose monitoring, reporting 83% of readings in range. No hypoglycemia or need for nausea medication. He is no longer on amlodipine .  He experiences intermittent swelling in his left leg for about three weeks without pain, which improves in the morning. He has a history of a blood clot in the left leg and knee surgery on the same side. He is not using compression socks.  He recalls a past injury to his right elbow over a month ago, resulting in swelling resembling a 'golf ball' that resolved without medical attention. No current issues with the elbow.  No pain associated with the leg swelling and reports good energy levels. He is not experiencing any hypoglycemia and has not needed to use nausea medication.   Diabetes He presents for his follow-up diabetic visit. He has type 2  diabetes mellitus. His disease course has been stable. There are no hypoglycemic associated symptoms. Pertinent negatives for diabetes include no blurred vision, no chest pain, no polydipsia, no polyphagia and no polyuria. There are no hypoglycemic complications. Diabetic complications include nephropathy. Risk factors for coronary artery disease include diabetes mellitus, dyslipidemia, hypertension, male sex and sedentary lifestyle. His breakfast blood glucose is taken between 8-9 am. His breakfast blood glucose range is generally 90-110 mg/dl.  Hypertension This is a chronic problem. The current episode started more than 1 year ago. The problem has been gradually improving since onset. The problem is controlled. Pertinent negatives include no blurred vision, chest pain, palpitations or shortness of breath.     Past Medical History:  Diagnosis Date   Diabetes mellitus    High cholesterol    Hypertension      Family History  Problem Relation Age of Onset   Hypertension Mother    Multiple myeloma Mother    Hypertension Father    Diabetes Father      Current Outpatient Medications:    acyclovir  (ZOVIRAX ) 400 MG tablet, Take 1 tablet (400 mg total) by mouth daily., Disp: 30 tablet, Rfl: 6   atorvastatin  (LIPITOR) 40 MG tablet, Take 1 tablet (40 mg total) by mouth daily. (Patient taking differently: Take 40 mg by mouth in the morning.), Disp: 90 tablet, Rfl: 1   B-D ULTRAFINE III SHORT PEN 31G X 8 MM MISC, Inject into the skin as directed., Disp: , Rfl:  calcium  carbonate (TUMS EX) 750 MG chewable tablet, Chew 1 tablet by mouth daily., Disp: , Rfl:    Continuous Glucose Sensor (DEXCOM G7 SENSOR) MISC, USE AS DIRECTED TO CHECK BLOOD SUGARS. CHANGE EVERY 10 DAYS, Disp: 1 each, Rfl: 3   cyanocobalamin  (VITAMIN B12) 1000 MCG/ML injection, Inject 1 mL (1,000 mcg total) into the muscle every 30 (thirty) days., Disp: 9 mL, Rfl: 1   Dulaglutide  (TRULICITY ) 1.5 MG/0.5ML SOAJ, Inject 1.5 mg into the  skin every Sunday., Disp: , Rfl:    ELIQUIS  5 MG TABS tablet, TAKE 1 TABLET BY MOUTH TWICE A DAY, Disp: 60 tablet, Rfl: 2   enalapril  (VASOTEC ) 10 MG tablet, TAKE 1 TABLET BY MOUTH EVERY DAY, Disp: 90 tablet, Rfl: 0   folic acid  (FOLVITE ) 1 MG tablet, TAKE 1 TABLET BY MOUTH EVERY DAY, Disp: 90 tablet, Rfl: 1   glucose blood (ONETOUCH VERIO) test strip, Use as instructed to check blood sugars twice daily E11.69, Disp: 100 each, Rfl: 2   glucose blood test strip, Use as instructed to test blood sugar 3 times a day. Dx code: e11.65, Disp: 100 each, Rfl: 2   omeprazole  (PRILOSEC) 40 MG capsule, TAKE 1 CAPSULE (40 MG TOTAL) BY MOUTH DAILY., Disp: 90 capsule, Rfl: 1   ondansetron  (ZOFRAN ) 8 MG tablet, Take 8 mg by mouth every 8 (eight) hours as needed for nausea or vomiting., Disp: , Rfl:    prochlorperazine  (COMPAZINE ) 10 MG tablet, Take 10 mg by mouth every 6 (six) hours as needed for nausea or vomiting., Disp: , Rfl:    sildenafil  (VIAGRA ) 100 MG tablet, Take 1 tablet (100 mg total) by mouth as needed for erectile dysfunction. TAKE 1 TABLET BY MOUTH EVERY DAY prn, Disp: 10 tablet, Rfl: 2   SYRINGE-NEEDLE, DISP, 3 ML (B-D INTE[employer]RA SYRINGE) 25G X 5/8 3 ML MISC, USE AS DIRECTED, Disp: 12 each, Rfl: 24   dexamethasone  (DECADRON ) 4 MG tablet, Take 2 tabs in the morning with breakfast once every week before chemotherapy, Disp: , Rfl:    insulin  glargine (LANTUS ) 100 UNIT/ML injection, Inject 0.1 mLs (10 Units total) into the skin at bedtime., Disp: , Rfl:    No Known Allergies   Review of Systems  Constitutional: Negative.   Eyes:  Negative for blurred vision.  Respiratory: Negative.  Negative for shortness of breath.   Cardiovascular: Negative.  Negative for chest pain and palpitations.  Gastrointestinal: Negative.   Endocrine: Negative.  Negative for polydipsia, polyphagia and polyuria.  Genitourinary:  Negative for testicular pain.  Skin: Negative.   Allergic/Immunologic: Negative.    Hematological: Negative.      Today's Vitals   01/28/24 1140  BP: 128/70  Pulse: 67  Temp: 97.6 F (36.4 C)  SpO2: 98%  Weight: 251 lb 3.2 oz (113.9 kg)  Height: 6' 3 (1.905 m)   Body mass index is 31.4 kg/m.  Wt Readings from Last 3 Encounters:  02/02/24 244 lb (110.7 kg)  01/28/24 251 lb 3.2 oz (113.9 kg)  01/26/24 248 lb 8 oz (112.7 kg)     Objective:  Physical Exam Vitals and nursing note reviewed.  Constitutional:      Appearance: Normal appearance.  HENT:     Head: Normocephalic and atraumatic.  Eyes:     Extraocular Movements: Extraocular movements intact.  Cardiovascular:     Rate and Rhythm: Normal rate and regular rhythm.     Heart sounds: Normal heart sounds.  Pulmonary:     Effort: Pulmonary effort is  normal.     Breath sounds: Normal breath sounds.  Musculoskeletal:     Cervical back: Normal range of motion.  Skin:    General: Skin is warm.  Neurological:     General: No focal deficit present.     Mental Status: He is alert.  Psychiatric:        Mood and Affect: Mood normal.         Assessment And Plan:  Type 2 diabetes mellitus with stage 2 chronic kidney disease, with long-term current use of insulin  (HCC) Assessment & Plan: Well-controlled with morning blood sugars around 113 mg/dL and 16% time in range on Dexcom G7. - Continue Trulicity  1.5 mg. - Continue Lantus  13 units. - Provide a new Dexcom sensor. - Check HbA1c  Orders: -     Hemoglobin A1c  Hypertensive nephropathy Assessment & Plan: Chronic, well controlled.  He is encouraged to follow low sodium diet. Amlodipine  has been discontinued - Continue with enalapril  10mg  daily   Multiple myeloma not having achieved remission [facility]) Assessment & Plan: Chronic, currently on treatment with Zometa  and dexamethasone .    Peripheral edema Assessment & Plan: Intermittent pitting edema likely due to fluid retention, not DVT. History of knee surgery may contribute. Diuretics avoided  due to dehydration risk. - Advise use of compression hose on days with prolonged standing. - Elevate legs when swelling occurs. - Consider ultrasound if edema persists.   Effusion of right elbow Assessment & Plan: He reports having this several weeks ago, sx have since resolved. He did not pursue aspiration.    Adenocarcinoma of left lung [facility]) Assessment & Plan: Chronic, currently under surveillance. Had f/u PET?CT yesterday, results are pending.   Acquired thrombophilia (HCC) Assessment & Plan: He is currently on Eliquis  due to PE/DVT.     Return in 4 months (on 05/29/2024) for 4 MONTH DM F/U.SABRA  Patient was given opportunity to ask questions. Patient verbalized understanding of the plan and was able to repeat key elements of the plan. All questions were answered to their satisfaction.   I, William LOISE Slocumb, MD, have reviewed all documentation for this visit. The documentation on 01/28/24 for the exam, diagnosis, procedures, and orders are all accurate and complete.   IF YOU HAVE BEEN REFERRED TO A SPECIALIST, IT MAY TAKE 1-2 WEEKS TO SCHEDULE/PROCESS THE REFERRAL. IF YOU HAVE NOT HEARD FROM US /SPECIALIST IN TWO WEEKS, PLEASE GIVE US  A CALL AT [phone].   THE PATIENT IS ENCOURAGED TO PRACTICE SOCIAL DISTANCING DUE TO THE COVID-19 PANDEMIC.

## 2024-01-29 LAB — MULTIPLE MYELOMA PANEL, SERUM
Albumin SerPl Elph-Mcnc: 3.5 g/dL (ref 2.9–4.4)
Albumin/Glob SerPl: 1.3 (ref 0.7–1.7)
Alpha 1: 0.2 g/dL (ref 0.0–0.4)
Alpha2 Glob SerPl Elph-Mcnc: 0.8 g/dL (ref 0.4–1.0)
B-Globulin SerPl Elph-Mcnc: 0.8 g/dL (ref 0.7–1.3)
Gamma Glob SerPl Elph-Mcnc: 1.1 g/dL (ref 0.4–1.8)
Globulin, Total: 2.9 g/dL (ref 2.2–3.9)
IgA: 21 mg/dL — ABNORMAL LOW (ref 61–437)
IgG (Immunoglobin G), Serum: 1309 mg/dL (ref 603–1613)
IgM (Immunoglobulin M), Srm: 28 mg/dL (ref 20–172)
M Protein SerPl Elph-Mcnc: 1 g/dL — ABNORMAL HIGH
Total Protein ELP: 6.4 g/dL (ref 6.0–8.5)

## 2024-01-29 LAB — HEMOGLOBIN A1C
Est. average glucose Bld gHb Est-mCnc: 131 mg/dL
Hgb A1c MFr Bld: 6.2 % — ABNORMAL HIGH (ref 4.8–5.6)

## 2024-01-31 ENCOUNTER — Ambulatory Visit: Payer: Self-pay | Admitting: Internal Medicine

## 2024-02-02 ENCOUNTER — Inpatient Hospital Stay

## 2024-02-02 ENCOUNTER — Encounter: Payer: Self-pay | Admitting: Hematology and Oncology

## 2024-02-02 ENCOUNTER — Inpatient Hospital Stay (HOSPITAL_BASED_OUTPATIENT_CLINIC_OR_DEPARTMENT_OTHER): Admitting: Hematology and Oncology

## 2024-02-02 VITALS — BP 164/77 | HR 67 | Temp 97.8°F | Resp 18 | Ht 75.0 in | Wt 244.0 lb

## 2024-02-02 DIAGNOSIS — E1122 Type 2 diabetes mellitus with diabetic chronic kidney disease: Secondary | ICD-10-CM | POA: Diagnosis not present

## 2024-02-02 DIAGNOSIS — C9000 Multiple myeloma not having achieved remission: Secondary | ICD-10-CM

## 2024-02-02 DIAGNOSIS — D539 Nutritional anemia, unspecified: Secondary | ICD-10-CM | POA: Diagnosis not present

## 2024-02-02 DIAGNOSIS — E1165 Type 2 diabetes mellitus with hyperglycemia: Secondary | ICD-10-CM | POA: Diagnosis not present

## 2024-02-02 DIAGNOSIS — Z5112 Encounter for antineoplastic immunotherapy: Secondary | ICD-10-CM | POA: Diagnosis not present

## 2024-02-02 DIAGNOSIS — N183 Chronic kidney disease, stage 3 unspecified: Secondary | ICD-10-CM

## 2024-02-02 DIAGNOSIS — D6481 Anemia due to antineoplastic chemotherapy: Secondary | ICD-10-CM | POA: Diagnosis not present

## 2024-02-02 DIAGNOSIS — C3492 Malignant neoplasm of unspecified part of left bronchus or lung: Secondary | ICD-10-CM | POA: Diagnosis not present

## 2024-02-02 DIAGNOSIS — Z794 Long term (current) use of insulin: Secondary | ICD-10-CM

## 2024-02-02 DIAGNOSIS — Z5111 Encounter for antineoplastic chemotherapy: Secondary | ICD-10-CM | POA: Diagnosis not present

## 2024-02-02 DIAGNOSIS — Z85118 Personal history of other malignant neoplasm of bronchus and lung: Secondary | ICD-10-CM | POA: Diagnosis not present

## 2024-02-02 LAB — CMP (CANCER CENTER ONLY)
ALT: 13 U/L (ref 0–44)
AST: 21 U/L (ref 15–41)
Albumin: 4.3 g/dL (ref 3.5–5.0)
Alkaline Phosphatase: 107 U/L (ref 38–126)
Anion gap: 5 (ref 5–15)
BUN: 16 mg/dL (ref 8–23)
CO2: 27 mmol/L (ref 22–32)
Calcium: 9.8 mg/dL (ref 8.9–10.3)
Chloride: 105 mmol/L (ref 98–111)
Creatinine: 1.38 mg/dL — ABNORMAL HIGH (ref 0.61–1.24)
GFR, Estimated: 55 mL/min — ABNORMAL LOW (ref >60)
Glucose, Bld: 120 mg/dL — ABNORMAL HIGH (ref 70–99)
Potassium: 4.9 mmol/L (ref 3.5–5.1)
Sodium: 137 mmol/L (ref 135–145)
Total Bilirubin: 0.6 mg/dL (ref 0.0–1.2)
Total Protein: 7.4 g/dL (ref 6.5–8.1)

## 2024-02-02 LAB — CBC WITH DIFFERENTIAL (CANCER CENTER ONLY)
Abs Immature Granulocytes: 0.03 K/uL (ref 0.00–0.07)
Basophils Absolute: 0 K/uL (ref 0.0–0.1)
Basophils Relative: 0 %
Eosinophils Absolute: 0 K/uL (ref 0.0–0.5)
Eosinophils Relative: 0 %
HCT: 30.2 % — ABNORMAL LOW (ref 39.0–52.0)
Hemoglobin: 10 g/dL — ABNORMAL LOW (ref 13.0–17.0)
Immature Granulocytes: 0 %
Lymphocytes Relative: 7 %
Lymphs Abs: 0.5 K/uL — ABNORMAL LOW (ref 0.7–4.0)
MCH: 30.8 pg (ref 26.0–34.0)
MCHC: 33.1 g/dL (ref 30.0–36.0)
MCV: 92.9 fL (ref 80.0–100.0)
Monocytes Absolute: 0.2 K/uL (ref 0.1–1.0)
Monocytes Relative: 3 %
Neutro Abs: 6.9 K/uL (ref 1.7–7.7)
Neutrophils Relative %: 90 %
Platelet Count: 168 K/uL (ref 150–400)
RBC: 3.25 MIL/uL — ABNORMAL LOW (ref 4.22–5.81)
RDW: 15.5 % (ref 11.5–15.5)
WBC Count: 7.7 K/uL (ref 4.0–10.5)
nRBC: 0 % (ref 0.0–0.2)

## 2024-02-02 MED ORDER — BORTEZOMIB CHEMO SQ INJECTION 3.5 MG (2.5MG/ML)
1.3000 mg/m2 | Freq: Once | INTRAMUSCULAR | Status: AC
  Administered 2024-02-02 (×2): 3 mg via SUBCUTANEOUS
  Filled 2024-02-02: qty 1.2

## 2024-02-02 MED ORDER — DIPHENHYDRAMINE HCL 25 MG PO CAPS
50.0000 mg | ORAL_CAPSULE | Freq: Once | ORAL | Status: AC
  Administered 2024-02-02 (×2): 50 mg via ORAL
  Filled 2024-02-02: qty 2

## 2024-02-02 MED ORDER — INSULIN GLARGINE 100 UNIT/ML ~~LOC~~ SOLN
10.0000 [IU] | Freq: Every day | SUBCUTANEOUS | Status: DC
Start: 2024-02-02 — End: 2024-03-08

## 2024-02-02 MED ORDER — DARATUMUMAB-HYALURONIDASE-FIHJ 1800-30000 MG-UT/15ML ~~LOC~~ SOLN
1800.0000 mg | Freq: Once | SUBCUTANEOUS | Status: AC
  Administered 2024-02-02 (×2): 1800 mg via SUBCUTANEOUS
  Filled 2024-02-02: qty 15

## 2024-02-02 MED ORDER — ACETAMINOPHEN 325 MG PO TABS
650.0000 mg | ORAL_TABLET | Freq: Once | ORAL | Status: AC
  Administered 2024-02-02 (×2): 650 mg via ORAL
  Filled 2024-02-02: qty 2

## 2024-02-02 MED ORDER — DEXAMETHASONE 4 MG PO TABS: ORAL_TABLET | ORAL | Status: DC

## 2024-02-02 NOTE — Assessment & Plan Note (Addendum)
 I reviewed PET/CT imaging from August 2025 which showed no evidence of disease recurrence We will continue to monitor his lung once every 6 months, next imaging study will be February 2026

## 2024-02-02 NOTE — Assessment & Plan Note (Addendum)
 The patient have prior diagnosis of smoldering myeloma.  He was referred to Dr. 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 PET/CT imaging was reviewed with the patient and his wife which show no evidence of significant bone involvement/fractures  So far, he tolerated treatment well except for intermittent reflux and high blood sugar on the days that he takes treatment He will continue to take acyclovir  for antimicrobial prophylaxis We have received Zometa  recently I reviewed recent myeloma panel which show positive response to therapy I recommend dexamethasone  taper to 8 mg weekly due to side effects of dexamethasone  with high blood sugar and fluid retention We discussed the role of bone marrow transplant and for now, he is not keen for transplant evaluation He will continue treatment with some schedule modification due to holidays I will continue to monitor his myeloma panel once a month and I will see him again next month for further follow-up

## 2024-02-02 NOTE — Progress Notes (Signed)
 Pt states he took steroid prior to arrival.

## 2024-02-02 NOTE — Assessment & Plan Note (Addendum)
 He has multifactorial anemia, 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 He is not symptomatic Observe closely for now

## 2024-02-02 NOTE — Patient Instructions (Signed)
 [facility] - A DEPT OF [facility]  Discharge Instructions: Thank you for choosing [facility] to provide your oncology and hematology care.   If you have a lab appointment with the Cancer Center, please go directly to the Cancer Center and check in at the registration area.   Wear comfortable clothing and clothing appropriate for easy access to any Portacath or PICC line.   We strive to give you quality time with your provider. You may need to reschedule your appointment if you arrive late (15 or more minutes).  Arriving late affects you and other patients whose appointments are after yours.  Also, if you miss three or more appointments without notifying the office, you may be dismissed from the clinic at the provider's discretion.      For prescription refill requests, have your pharmacy contact our office and allow 72 hours for refills to be completed.    Today you received the following chemotherapy and/or immunotherapy agents darzalex faspro, velcade      To help prevent nausea and vomiting after your treatment, we encourage you to take your nausea medication as directed.  BELOW ARE SYMPTOMS THAT SHOULD BE REPORTED IMMEDIATELY: *FEVER GREATER THAN 100.4 F (38 C) OR HIGHER *CHILLS OR SWEATING *NAUSEA AND VOMITING THAT IS NOT CONTROLLED WITH YOUR NAUSEA MEDICATION *UNUSUAL SHORTNESS OF BREATH *UNUSUAL BRUISING OR BLEEDING *URINARY PROBLEMS (pain or burning when urinating, or frequent urination) *BOWEL PROBLEMS (unusual diarrhea, constipation, pain near the anus) TENDERNESS IN MOUTH AND THROAT WITH OR WITHOUT PRESENCE OF ULCERS (sore throat, sores in mouth, or a toothache) UNUSUAL RASH, SWELLING OR PAIN  UNUSUAL VAGINAL DISCHARGE OR ITCHING   Items with * indicate a potential emergency and should be followed up as soon as possible or go to the Emergency Department if any problems should occur.  Please show the CHEMOTHERAPY ALERT CARD or  IMMUNOTHERAPY ALERT CARD at check-in to the Emergency Department and triage nurse.  Should you have questions after your visit or need to cancel or reschedule your appointment, please contact [facility] - A DEPT OF [facility]  Dept: [phone]  and follow the prompts.  Office hours are 8:00 a.m. to 4:30 p.m. Monday - Friday. Please note that voicemails left after 4:00 p.m. may not be returned until the following business day.  We are closed weekends and major holidays. You have access to a nurse at all times for urgent questions. Please call the main number to the clinic Dept: [phone] and follow the prompts.   For any non-urgent questions, you may also contact your provider using MyChart. We now offer e-Visits for anyone 25 and older to request care online for non-urgent symptoms. For details visit [email or web address].   Also download the MyChart app! Go to the app store, search "MyChart", open the app, select [place], and log in with your MyChart username and password.

## 2024-02-02 NOTE — Progress Notes (Signed)
 [facility] OFFICE PROGRESS NOTE  Patient Care Team: Jarold Medici, MD as PCP - General (Internal Medicine) [name], RN as Oncology Nurse Navigator  Assessment & Plan Multiple myeloma not having achieved remission [facility]) The patient have prior diagnosis of smoldering myeloma.  He was referred to Dr. 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 PET/CT imaging was reviewed with the patient and his wife which show no evidence of significant bone involvement/fractures  So far, he tolerated treatment well except for intermittent reflux and high blood sugar on the days that he takes treatment He will continue to take acyclovir  for antimicrobial prophylaxis We have received Zometa  recently I reviewed recent myeloma panel which show positive response to therapy I recommend dexamethasone  taper to 8 mg weekly due to side effects of dexamethasone  with high blood sugar and fluid retention We discussed the role of bone marrow transplant and for now, he is not keen for transplant evaluation He will continue treatment with some schedule modification due to holidays I will continue to monitor his myeloma panel once a month and I will see him again next month for further follow-up Adenocarcinoma of left lung (HCC) I reviewed PET/CT imaging from August 2025 which showed no evidence of disease recurrence We will continue to monitor his lung once every 6 months, next imaging study will be February 2026 Deficiency anemia He has multifactorial anemia, 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 He is not symptomatic Observe closely for now Type 2 diabetes mellitus with stage 3 chronic  kidney disease, with long-term current use of insulin , unspecified whether stage 3a or 3b CKD (HCC) The patient is highly motivated His weight fluctuate a little bit but he is blood sugar control has improved with dietary changes and exercise He will continue gentle insulin  taper I am hopeful by reducing the dose of dexamethasone , he can come off insulin  completely by the end of the year  No orders of the defined types were placed in this encounter.    Almarie Bedford, MD  INTERVAL HISTORY: he returns for treatment follow-up Complications related to previous cycle of chemotherapy included anemia,, leg edema, and elevated serum creatinine He is doing well overall He noted intermittent leg edema Denies recent trauma or calf pain No recent bleeding I reviewed results of myeloma panel and PET/CT imaging with the patient We discussed medication changes and future follow-up  PHYSICAL EXAMINATION: ECOG PERFORMANCE STATUS: 1 - Symptomatic but completely ambulatory  No results found for: CAN125    Latest Ref Rng & Units 02/02/2024   12:09 PM 01/26/2024    8:20 AM 01/19/2024    8:25 AM  CBC  WBC 4.0 - 10.5 K/uL 7.7  7.4  8.8   Hemoglobin 13.0 - 17.0 g/dL 89.9  9.5  9.5   Hematocrit 39.0 - 52.0 % 30.2  29.2  28.6   Platelets 150 - 400 K/uL 168  162  162       Chemistry      Component Value Date/Time   NA 137 01/26/2024 0820   NA 137 11/05/2022 1152   K 4.1 01/26/2024 0820   CL 105 01/26/2024 0820   CO2 28 01/26/2024 0820   BUN  19 01/26/2024 0820   BUN 11 11/05/2022 1152   CREATININE 1.38 (H) 01/26/2024 0820      Component Value Date/Time   CALCIUM  8.7 (L) 01/26/2024 0820   ALKPHOS 104 01/26/2024 0820   AST 18 01/26/2024 0820   ALT 13 01/26/2024 0820   BILITOT 0.5 01/26/2024 0820       Vitals:   02/02/24 1224  BP: (!) 164/77  Pulse: 67  Resp: 18  Temp: 97.8 F (36.6 C)  SpO2: 100%   Filed Weights   02/02/24 1224  Weight: 244 lb (110.7 kg)   Other relevant data  reviewed during this visit included CBC, CMP, PET/CT imaging from August 2025 and myeloma panel

## 2024-02-02 NOTE — Assessment & Plan Note (Addendum)
 The patient is highly motivated His weight fluctuate a little bit but he is blood sugar control has improved with dietary changes and exercise He will continue gentle insulin  taper I am hopeful by reducing the dose of dexamethasone , he can come off insulin  completely by the end of the year

## 2024-02-07 DIAGNOSIS — R600 Localized edema: Secondary | ICD-10-CM | POA: Insufficient documentation

## 2024-02-07 DIAGNOSIS — D6869 Other thrombophilia: Secondary | ICD-10-CM | POA: Insufficient documentation

## 2024-02-07 NOTE — Assessment & Plan Note (Signed)
 He reports having this several weeks ago, sx have since resolved. He did not pursue aspiration.

## 2024-02-07 NOTE — Assessment & Plan Note (Signed)
 Chronic, currently on treatment with Zometa  and dexamethasone .

## 2024-02-07 NOTE — Assessment & Plan Note (Signed)
 Well-controlled with morning blood sugars around 113 mg/dL and 16% time in range on Dexcom G7. - Continue Trulicity  1.5 mg. - Continue Lantus  13 units. - Provide a new Dexcom sensor. - Check HbA1c

## 2024-02-07 NOTE — Assessment & Plan Note (Addendum)
 Chronic, well controlled.  He is encouraged to follow low sodium diet. Amlodipine  has been discontinued - Continue with enalapril  10mg  daily

## 2024-02-07 NOTE — Assessment & Plan Note (Signed)
 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

## 2024-02-07 NOTE — Assessment & Plan Note (Signed)
 Intermittent pitting edema likely due to fluid retention, not DVT. History of knee surgery may contribute. Diuretics avoided due to dehydration risk. - Advise use of compression hose on days with prolonged standing. - Elevate legs when swelling occurs. - Consider ultrasound if edema persists.

## 2024-02-07 NOTE — Assessment & Plan Note (Addendum)
 Chronic, currently under surveillance. Had f/u PET?CT yesterday, results are pending.

## 2024-02-14 ENCOUNTER — Other Ambulatory Visit: Payer: Self-pay | Admitting: Hematology and Oncology

## 2024-02-16 ENCOUNTER — Inpatient Hospital Stay

## 2024-02-16 ENCOUNTER — Encounter: Payer: Self-pay | Admitting: Hematology and Oncology

## 2024-02-16 VITALS — BP 142/70 | HR 63 | Temp 98.0°F | Resp 16 | Wt 247.5 lb

## 2024-02-16 DIAGNOSIS — E1165 Type 2 diabetes mellitus with hyperglycemia: Secondary | ICD-10-CM | POA: Diagnosis not present

## 2024-02-16 DIAGNOSIS — D6481 Anemia due to antineoplastic chemotherapy: Secondary | ICD-10-CM | POA: Diagnosis not present

## 2024-02-16 DIAGNOSIS — C9000 Multiple myeloma not having achieved remission: Secondary | ICD-10-CM

## 2024-02-16 DIAGNOSIS — Z5112 Encounter for antineoplastic immunotherapy: Secondary | ICD-10-CM | POA: Diagnosis not present

## 2024-02-16 DIAGNOSIS — D539 Nutritional anemia, unspecified: Secondary | ICD-10-CM | POA: Diagnosis not present

## 2024-02-16 DIAGNOSIS — N183 Chronic kidney disease, stage 3 unspecified: Secondary | ICD-10-CM | POA: Diagnosis not present

## 2024-02-16 DIAGNOSIS — Z85118 Personal history of other malignant neoplasm of bronchus and lung: Secondary | ICD-10-CM | POA: Diagnosis not present

## 2024-02-16 DIAGNOSIS — Z5111 Encounter for antineoplastic chemotherapy: Secondary | ICD-10-CM | POA: Diagnosis not present

## 2024-02-16 DIAGNOSIS — E1122 Type 2 diabetes mellitus with diabetic chronic kidney disease: Secondary | ICD-10-CM | POA: Diagnosis not present

## 2024-02-16 LAB — CBC WITH DIFFERENTIAL (CANCER CENTER ONLY)
Abs Immature Granulocytes: 0.02 K/uL (ref 0.00–0.07)
Basophils Absolute: 0 K/uL (ref 0.0–0.1)
Basophils Relative: 0 %
Eosinophils Absolute: 0.1 K/uL (ref 0.0–0.5)
Eosinophils Relative: 1 %
HCT: 30.1 % — ABNORMAL LOW (ref 39.0–52.0)
Hemoglobin: 10 g/dL — ABNORMAL LOW (ref 13.0–17.0)
Immature Granulocytes: 0 %
Lymphocytes Relative: 20 %
Lymphs Abs: 1.2 K/uL (ref 0.7–4.0)
MCH: 30.9 pg (ref 26.0–34.0)
MCHC: 33.2 g/dL (ref 30.0–36.0)
MCV: 92.9 fL (ref 80.0–100.0)
Monocytes Absolute: 0.6 K/uL (ref 0.1–1.0)
Monocytes Relative: 11 %
Neutro Abs: 4 K/uL (ref 1.7–7.7)
Neutrophils Relative %: 68 %
Platelet Count: 229 K/uL (ref 150–400)
RBC: 3.24 MIL/uL — ABNORMAL LOW (ref 4.22–5.81)
RDW: 14.3 % (ref 11.5–15.5)
WBC Count: 5.9 K/uL (ref 4.0–10.5)
nRBC: 0 % (ref 0.0–0.2)

## 2024-02-16 LAB — CMP (CANCER CENTER ONLY)
ALT: 13 U/L (ref 0–44)
AST: 18 U/L (ref 15–41)
Albumin: 4.1 g/dL (ref 3.5–5.0)
Alkaline Phosphatase: 104 U/L (ref 38–126)
Anion gap: 3 — ABNORMAL LOW (ref 5–15)
BUN: 12 mg/dL (ref 8–23)
CO2: 28 mmol/L (ref 22–32)
Calcium: 9 mg/dL (ref 8.9–10.3)
Chloride: 106 mmol/L (ref 98–111)
Creatinine: 1.27 mg/dL — ABNORMAL HIGH (ref 0.61–1.24)
GFR, Estimated: >60 mL/min (ref >60)
Glucose, Bld: 125 mg/dL — ABNORMAL HIGH (ref 70–99)
Potassium: 4.4 mmol/L (ref 3.5–5.1)
Sodium: 137 mmol/L (ref 135–145)
Total Bilirubin: 0.4 mg/dL (ref 0.0–1.2)
Total Protein: 6.6 g/dL (ref 6.5–8.1)

## 2024-02-16 MED ORDER — BORTEZOMIB CHEMO SQ INJECTION 3.5 MG (2.5MG/ML)
1.3000 mg/m2 | Freq: Once | INTRAMUSCULAR | Status: AC
  Administered 2024-02-16: 3 mg via SUBCUTANEOUS
  Filled 2024-02-16: qty 1.2

## 2024-02-16 MED ORDER — ZOLEDRONIC ACID 4 MG/100ML IV SOLN
4.0000 mg | Freq: Once | INTRAVENOUS | Status: AC
  Administered 2024-02-16: 4 mg via INTRAVENOUS
  Filled 2024-02-16: qty 100

## 2024-02-16 MED ORDER — PROCHLORPERAZINE MALEATE 10 MG PO TABS
10.0000 mg | ORAL_TABLET | Freq: Once | ORAL | Status: AC
  Administered 2024-02-16: 10 mg via ORAL
  Filled 2024-02-16: qty 1

## 2024-02-16 MED ORDER — SODIUM CHLORIDE 0.9 % IV SOLN: INTRAVENOUS | Status: DC

## 2024-02-16 NOTE — Progress Notes (Signed)
 Patient took his dexamethasone  this morning.

## 2024-02-16 NOTE — Patient Instructions (Signed)
 [facility] - A DEPT OF [place]  Discharge Instructions: Thank you for choosing [facility] to provide your oncology and hematology care.   If you have a lab appointment with the Cancer Center, please go directly to the Cancer Center and check in at the registration area.   Wear comfortable clothing and clothing appropriate for easy access to any Portacath or PICC line.   We strive to give you quality time with your provider. You may need to reschedule your appointment if you arrive late (15 or more minutes).  Arriving late affects you and other patients whose appointments are after yours.  Also, if you miss three or more appointments without notifying the office, you may be dismissed from the clinic at the provider's discretion.      For prescription refill requests, have your pharmacy contact our office and allow 72 hours for refills to be completed.    Today you received the following chemotherapy and/or immunotherapy agents, velcade       To help prevent nausea and vomiting after your treatment, we encourage you to take your nausea medication as directed.  BELOW ARE SYMPTOMS THAT SHOULD BE REPORTED IMMEDIATELY: *FEVER GREATER THAN 100.4 F (38 C) OR HIGHER *CHILLS OR SWEATING *NAUSEA AND VOMITING THAT IS NOT CONTROLLED WITH YOUR NAUSEA MEDICATION *UNUSUAL SHORTNESS OF BREATH *UNUSUAL BRUISING OR BLEEDING *URINARY PROBLEMS (pain or burning when urinating, or frequent urination) *BOWEL PROBLEMS (unusual diarrhea, constipation, pain near the anus) TENDERNESS IN MOUTH AND THROAT WITH OR WITHOUT PRESENCE OF ULCERS (sore throat, sores in mouth, or a toothache) UNUSUAL RASH, SWELLING OR PAIN  UNUSUAL VAGINAL DISCHARGE OR ITCHING   Items with * indicate a potential emergency and should be followed up as soon as possible or go to the Emergency Department if any problems should occur.  Please show the CHEMOTHERAPY ALERT CARD or IMMUNOTHERAPY  ALERT CARD at check-in to the Emergency Department and triage nurse.  Should you have questions after your visit or need to cancel or reschedule your appointment, please contact [facility] - A DEPT OF [facility]  Dept: [phone]  and follow the prompts.  Office hours are 8:00 a.m. to 4:30 p.m. Monday - Friday. Please note that voicemails left after 4:00 p.m. may not be returned until the following business day.  We are closed weekends and major holidays. You have access to a nurse at all times for urgent questions. Please call the main number to the clinic Dept: [phone] and follow the prompts.   For any non-urgent questions, you may also contact your provider using MyChart. We now offer e-Visits for anyone 87 and older to request care online for non-urgent symptoms. For details visit [email or web address].   Also download the MyChart app! Go to the app store, search MyChart, open the app, select [place], and log in with your MyChart username and password.

## 2024-02-23 ENCOUNTER — Other Ambulatory Visit: Payer: Self-pay | Admitting: Internal Medicine

## 2024-02-26 ENCOUNTER — Encounter: Payer: Self-pay | Admitting: Hematology and Oncology

## 2024-02-29 ENCOUNTER — Ambulatory Visit (HOSPITAL_COMMUNITY)
Admission: RE | Admit: 2024-02-29 | Discharge: 2024-02-29 | Disposition: A | Discharge status: Home / Self Care | Source: Ambulatory Visit | Attending: Hematology and Oncology | Admitting: Hematology and Oncology

## 2024-02-29 ENCOUNTER — Inpatient Hospital Stay

## 2024-02-29 ENCOUNTER — Inpatient Hospital Stay: Attending: Hematology and Oncology | Admitting: Hematology and Oncology

## 2024-02-29 ENCOUNTER — Encounter: Payer: Self-pay | Admitting: Hematology and Oncology

## 2024-02-29 VITALS — BP 151/70 | HR 73 | Temp 97.8°F | Resp 18 | Ht 75.0 in | Wt 239.8 lb

## 2024-02-29 DIAGNOSIS — Z85118 Personal history of other malignant neoplasm of bronchus and lung: Secondary | ICD-10-CM | POA: Insufficient documentation

## 2024-02-29 DIAGNOSIS — Z923 Personal history of irradiation: Secondary | ICD-10-CM | POA: Diagnosis not present

## 2024-02-29 DIAGNOSIS — D539 Nutritional anemia, unspecified: Secondary | ICD-10-CM | POA: Diagnosis not present

## 2024-02-29 DIAGNOSIS — Z5112 Encounter for antineoplastic immunotherapy: Secondary | ICD-10-CM | POA: Insufficient documentation

## 2024-02-29 DIAGNOSIS — C3490 Malignant neoplasm of unspecified part of unspecified bronchus or lung: Secondary | ICD-10-CM | POA: Diagnosis not present

## 2024-02-29 DIAGNOSIS — M5440 Lumbago with sciatica, unspecified side: Secondary | ICD-10-CM | POA: Insufficient documentation

## 2024-02-29 DIAGNOSIS — M5430 Sciatica, unspecified side: Secondary | ICD-10-CM | POA: Diagnosis not present

## 2024-02-29 DIAGNOSIS — S39012A Strain of muscle, fascia and tendon of lower back, initial encounter: Secondary | ICD-10-CM | POA: Diagnosis not present

## 2024-02-29 DIAGNOSIS — M51369 Other intervertebral disc degeneration, lumbar region without mention of lumbar back pain or lower extremity pain: Secondary | ICD-10-CM | POA: Diagnosis not present

## 2024-02-29 DIAGNOSIS — M545 Low back pain, unspecified: Secondary | ICD-10-CM

## 2024-02-29 DIAGNOSIS — C9000 Multiple myeloma not having achieved remission: Secondary | ICD-10-CM | POA: Insufficient documentation

## 2024-02-29 DIAGNOSIS — Z5111 Encounter for antineoplastic chemotherapy: Secondary | ICD-10-CM | POA: Diagnosis not present

## 2024-02-29 LAB — CMP (CANCER CENTER ONLY)
ALT: 10 U/L (ref 0–44)
AST: 16 U/L (ref 15–41)
Albumin: 4.2 g/dL (ref 3.5–5.0)
Alkaline Phosphatase: 97 U/L (ref 38–126)
Anion gap: 6 (ref 5–15)
BUN: 14 mg/dL (ref 8–23)
CO2: 26 mmol/L (ref 22–32)
Calcium: 8.8 mg/dL — ABNORMAL LOW (ref 8.9–10.3)
Chloride: 106 mmol/L (ref 98–111)
Creatinine: 1.39 mg/dL — ABNORMAL HIGH (ref 0.61–1.24)
GFR, Estimated: 55 mL/min — ABNORMAL LOW (ref >60)
Glucose, Bld: 180 mg/dL — ABNORMAL HIGH (ref 70–99)
Potassium: 4.4 mmol/L (ref 3.5–5.1)
Sodium: 138 mmol/L (ref 135–145)
Total Bilirubin: 0.4 mg/dL (ref 0.0–1.2)
Total Protein: 7.3 g/dL (ref 6.5–8.1)

## 2024-02-29 LAB — CBC WITH DIFFERENTIAL (CANCER CENTER ONLY)
Abs Immature Granulocytes: 0.03 K/uL (ref 0.00–0.07)
Basophils Absolute: 0 K/uL (ref 0.0–0.1)
Basophils Relative: 0 %
Eosinophils Absolute: 0.1 K/uL (ref 0.0–0.5)
Eosinophils Relative: 1 %
HCT: 32 % — ABNORMAL LOW (ref 39.0–52.0)
Hemoglobin: 10.6 g/dL — ABNORMAL LOW (ref 13.0–17.0)
Immature Granulocytes: 0 %
Lymphocytes Relative: 16 %
Lymphs Abs: 1.1 K/uL (ref 0.7–4.0)
MCH: 30.8 pg (ref 26.0–34.0)
MCHC: 33.1 g/dL (ref 30.0–36.0)
MCV: 93 fL (ref 80.0–100.0)
Monocytes Absolute: 0.5 K/uL (ref 0.1–1.0)
Monocytes Relative: 7 %
Neutro Abs: 5.2 K/uL (ref 1.7–7.7)
Neutrophils Relative %: 76 %
Platelet Count: 226 K/uL (ref 150–400)
RBC: 3.44 MIL/uL — ABNORMAL LOW (ref 4.22–5.81)
RDW: 13.4 % (ref 11.5–15.5)
WBC Count: 6.9 K/uL (ref 4.0–10.5)
nRBC: 0 % (ref 0.0–0.2)

## 2024-02-29 MED ORDER — OXYCODONE HCL 10 MG PO TABS: 10.0000 mg | ORAL_TABLET | ORAL | 0 refills | Status: AC | PRN

## 2024-02-29 NOTE — Assessment & Plan Note (Addendum)
 The patient have prior diagnosis of smoldering myeloma.  He was referred to Dr. 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 PET/CT imaging was reviewed with the patient and his wife which show no evidence of significant bone involvement/fractures  So far, he tolerated treatment well except for intermittent reflux and high blood sugar on the days that he takes treatment He will continue to take acyclovir  for antimicrobial prophylaxis We have received Zometa  recently I reviewed recent myeloma panel which show positive response to therapy I recommend dexamethasone  taper to 8 mg weekly due to side effects of dexamethasone  with high blood sugar and fluid retention We discussed the role of bone marrow transplant and for now, he is not keen for transplant evaluation

## 2024-02-29 NOTE — Assessment & Plan Note (Addendum)
 He had recent severe back pain likely musculoskeletal in nature, reproducible on physical examination today I will order an x-ray of his back for evaluation and will reassess tomorrow The patient is also prescribed some pain medicine to take as needed We discussed risk of constipation

## 2024-02-29 NOTE — Progress Notes (Signed)
 [facility] OFFICE PROGRESS NOTE  Patient Care Team: Jarold Medici, MD as PCP - General (Internal Medicine) [name], RN as Oncology Nurse Navigator  Assessment & Plan Acute back pain with sciatica, unspecified laterality He had recent severe back pain likely musculoskeletal in nature, reproducible on physical examination today I will order an x-ray of his back for evaluation and will reassess tomorrow The patient is also prescribed some pain medicine to take as needed We discussed risk of constipation Multiple myeloma not having achieved remission [facility]) The patient have prior diagnosis of smoldering myeloma.  He was referred to Dr. 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 PET/CT imaging was reviewed with the patient and his wife which show no evidence of significant bone involvement/fractures  So far, he tolerated treatment well except for intermittent reflux and high blood sugar on the days that he takes treatment He will continue to take acyclovir  for antimicrobial prophylaxis We have received Zometa  recently I reviewed recent myeloma panel which show positive response to therapy I recommend dexamethasone  taper to 8 mg weekly due to side effects of dexamethasone  with high blood sugar and fluid retention We discussed the role of bone marrow transplant and for now, he is not keen for transplant evaluation  Orders Placed This Encounter  Procedures   DG Lumbar Spine Complete    Standing Status:   Future    Number of Occurrences:   1    Expected Date:   02/29/2024    Expiration Date:   02/28/2025    Reason for Exam (SYMPTOM  OR DIAGNOSIS REQUIRED):   severe back pain, recent  strain, diagnosis of myeloma and lung cancer on treatment    Preferred imaging location?:   [facility]     Almarie Bedford, MD  INTERVAL HISTORY: he returns for urgent evaluation He develops a severe back pain over the past few days The patient generally exercises on a regular basis including weightlifting He did not recall straining his back His pain got so severe over the weekend that he had to take some pain medicine leftover from prior surgery, oxycodone  over the weekend with some moderate relief He felt better when he lie flat on the floor and sleep on the floor  PHYSICAL EXAMINATION: ECOG PERFORMANCE STATUS: 1 - Symptomatic but completely ambulatory  Vitals:   02/29/24 1053  BP: (!) 151/70  Pulse: 73  Resp: 18  Temp: 97.8 F (36.6 C)  SpO2: 100%   Filed Weights   02/29/24 1053  Weight: 239 lb 12.8 oz (108.8 kg)   Limited examination is performed: He has pain on the lumbar spine region on deep palpation but not in the bone itself, rather in the musculature, left greater than right  Relevant data reviewed during this visit included X-Ray of lumbar spine

## 2024-03-01 ENCOUNTER — Other Ambulatory Visit

## 2024-03-01 ENCOUNTER — Encounter: Payer: Self-pay | Admitting: Hematology and Oncology

## 2024-03-01 ENCOUNTER — Inpatient Hospital Stay: Admitting: Hematology and Oncology

## 2024-03-01 ENCOUNTER — Inpatient Hospital Stay

## 2024-03-01 VITALS — BP 166/81 | HR 62 | Temp 97.7°F | Resp 18 | Ht 75.0 in | Wt 237.6 lb

## 2024-03-01 DIAGNOSIS — C9000 Multiple myeloma not having achieved remission: Secondary | ICD-10-CM

## 2024-03-01 DIAGNOSIS — M5430 Sciatica, unspecified side: Secondary | ICD-10-CM | POA: Diagnosis not present

## 2024-03-01 DIAGNOSIS — Z923 Personal history of irradiation: Secondary | ICD-10-CM | POA: Diagnosis not present

## 2024-03-01 DIAGNOSIS — Z85118 Personal history of other malignant neoplasm of bronchus and lung: Secondary | ICD-10-CM | POA: Diagnosis not present

## 2024-03-01 DIAGNOSIS — Z5112 Encounter for antineoplastic immunotherapy: Secondary | ICD-10-CM | POA: Diagnosis not present

## 2024-03-01 DIAGNOSIS — M5440 Lumbago with sciatica, unspecified side: Secondary | ICD-10-CM

## 2024-03-01 DIAGNOSIS — Z5111 Encounter for antineoplastic chemotherapy: Secondary | ICD-10-CM | POA: Diagnosis not present

## 2024-03-01 DIAGNOSIS — D539 Nutritional anemia, unspecified: Secondary | ICD-10-CM | POA: Diagnosis not present

## 2024-03-01 LAB — KAPPA/LAMBDA LIGHT CHAINS
Kappa free light chain: 42.4 mg/L — ABNORMAL HIGH (ref 3.3–19.4)
Kappa, lambda light chain ratio: 12.85 — ABNORMAL HIGH (ref 0.26–1.65)
Lambda free light chains: 3.3 mg/L — ABNORMAL LOW (ref 5.7–26.3)

## 2024-03-01 MED ORDER — BORTEZOMIB CHEMO SQ INJECTION 3.5 MG (2.5MG/ML)
1.3000 mg/m2 | Freq: Once | INTRAMUSCULAR | Status: AC
  Administered 2024-03-01: 3 mg via SUBCUTANEOUS
  Filled 2024-03-01: qty 1.2

## 2024-03-01 MED ORDER — PROCHLORPERAZINE MALEATE 10 MG PO TABS
10.0000 mg | ORAL_TABLET | Freq: Once | ORAL | Status: AC
  Administered 2024-03-01: 10 mg via ORAL
  Filled 2024-03-01: qty 1

## 2024-03-01 NOTE — Assessment & Plan Note (Addendum)
 I reviewed x-ray results with the patient and family There is no evidence of fracture His pain is better controlled with recent prescription of narcotic We discussed medicine taper I will reassess next week

## 2024-03-01 NOTE — Progress Notes (Signed)
 [facility] OFFICE PROGRESS NOTE  Patient Care Team: Jarold Medici, MD as PCP - General (Internal Medicine) [name], RN as Oncology Nurse Navigator  Assessment & Plan Multiple myeloma not having achieved remission [facility]) The patient have prior diagnosis of smoldering myeloma.  He was referred to Dr. 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 PET/CT imaging was reviewed with the patient and his wife which show no evidence of significant bone involvement/fractures  So far, he tolerated treatment well except for intermittent reflux and high blood sugar on the days that he takes treatment He will continue to take acyclovir  for antimicrobial prophylaxis He had received Zometa  recently, next due in November I recommend dexamethasone  taper to 8 mg weekly due to side effects of dexamethasone  with high blood sugar and fluid retention We discussed the role of bone marrow transplant and for now, he is not keen for transplant evaluation Repeat myeloma panel from yesterday is still pending, will discuss test results next week We will proceed with chemotherapy today as scheduled Acute back pain with sciatica, unspecified laterality I reviewed x-ray results with the patient and family There is no evidence of fracture His pain is better controlled with recent prescription of narcotic We discussed medicine taper I will reassess next week  No orders of the defined types were placed in this encounter.    Almarie Bedford, MD  INTERVAL HISTORY: he returns for treatment follow-up Complications related to previous cycle of chemotherapy included anemia, and recent back pain Since I saw him yesterday,  his pain is better controlled We discussed imaging study results  PHYSICAL EXAMINATION: ECOG PERFORMANCE STATUS: 1 - Symptomatic but completely ambulatory  No results found for: CAN125    Latest Ref Rng & Units 02/29/2024   11:21 AM 02/16/2024    8:02 AM 02/02/2024   12:09 PM  CBC  WBC 4.0 - 10.5 K/uL 6.9  5.9  7.7   Hemoglobin 13.0 - 17.0 g/dL 89.3  89.9  89.9   Hematocrit 39.0 - 52.0 % 32.0  30.1  30.2   Platelets 150 - 400 K/uL 226  229  168       Chemistry      Component Value Date/Time   NA 138 02/29/2024 1121   NA 137 11/05/2022 1152   K 4.4 02/29/2024 1121   CL 106 02/29/2024 1121   CO2 26 02/29/2024 1121   BUN 14 02/29/2024 1121   BUN 11 11/05/2022 1152   CREATININE 1.39 (H) 02/29/2024 1121      Component Value Date/Time   CALCIUM  8.8 (L) 02/29/2024 1121   ALKPHOS 97 02/29/2024 1121   AST 16 02/29/2024 1121   ALT 10 02/29/2024 1121   BILITOT 0.4 02/29/2024 1121       Vitals:   03/01/24 0906 03/01/24 0907  BP: (!) 172/81 (!) 166/81  Pulse: 62   Resp: 18   Temp: 97.7 F (36.5 C)   SpO2: 99%    Filed Weights   03/01/24 0906  Weight: 237 lb 9.6 oz (107.8 kg)   Other relevant data reviewed during this visit included CBC, CMP, x-ray

## 2024-03-01 NOTE — Patient Instructions (Signed)
 [facility] - A DEPT OF . [facility]  Discharge Instructions: Thank you for choosing [facility] to provide your oncology and hematology care.   If you have a lab appointment with the Cancer Center, please go directly to the Cancer Center and check in at the registration area.   Wear comfortable clothing and clothing appropriate for easy access to any Portacath or PICC line.   We strive to give you quality time with your provider. You may need to reschedule your appointment if you arrive late (15 or more minutes).  Arriving late affects you and other patients whose appointments are after yours.  Also, if you miss three or more appointments without notifying the office, you may be dismissed from the clinic at the provider's discretion.      For prescription refill requests, have your pharmacy contact our office and allow 72 hours for refills to be completed.    Today you received the following chemotherapy and/or immunotherapy agents: bortezomib  SQ (VELCADE )    To help prevent nausea and vomiting after your treatment, we encourage you to take your nausea medication as directed.  BELOW ARE SYMPTOMS THAT SHOULD BE REPORTED IMMEDIATELY: *FEVER GREATER THAN 100.4 F (38 C) OR HIGHER *CHILLS OR SWEATING *NAUSEA AND VOMITING THAT IS NOT CONTROLLED WITH YOUR NAUSEA MEDICATION *UNUSUAL SHORTNESS OF BREATH *UNUSUAL BRUISING OR BLEEDING *URINARY PROBLEMS (pain or burning when urinating, or frequent urination) *BOWEL PROBLEMS (unusual diarrhea, constipation, pain near the anus) TENDERNESS IN MOUTH AND THROAT WITH OR WITHOUT PRESENCE OF ULCERS (sore throat, sores in mouth, or a toothache) UNUSUAL RASH, SWELLING OR PAIN  UNUSUAL VAGINAL DISCHARGE OR ITCHING   Items with * indicate a potential emergency and should be followed up as soon as possible or go to the Emergency Department if any problems should occur.  Please show the CHEMOTHERAPY ALERT CARD or  IMMUNOTHERAPY ALERT CARD at check-in to the Emergency Department and triage nurse.  Should you have questions after your visit or need to cancel or reschedule your appointment, please contact [facility] - A DEPT OF [facility]  Dept: [phone]  and follow the prompts.  Office hours are 8:00 a.m. to 4:30 p.m. Monday - Friday. Please note that voicemails left after 4:00 p.m. may not be returned until the following business day.  We are closed weekends and major holidays. You have access to a nurse at all times for urgent questions. Please call the main number to the clinic Dept: [phone] and follow the prompts.   For any non-urgent questions, you may also contact your provider using MyChart. We now offer e-Visits for anyone 61 and older to request care online for non-urgent symptoms. For details visit [email or web address].   Also download the MyChart app! Go to the app store, search "MyChart", open the app, select [place], and log in with your MyChart username and password.

## 2024-03-01 NOTE — Assessment & Plan Note (Addendum)
 The patient have prior diagnosis of smoldering myeloma.  He was referred to Dr. 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 PET/CT imaging was reviewed with the patient and his wife which show no evidence of significant bone involvement/fractures  So far, he tolerated treatment well except for intermittent reflux and high blood sugar on the days that he takes treatment He will continue to take acyclovir  for antimicrobial prophylaxis He had received Zometa  recently, next due in November I recommend dexamethasone  taper to 8 mg weekly due to side effects of dexamethasone  with high blood sugar and fluid retention We discussed the role of bone marrow transplant and for now, he is not keen for transplant evaluation Repeat myeloma panel from yesterday is still pending, will discuss test results next week We will proceed with chemotherapy today as scheduled

## 2024-03-02 LAB — MULTIPLE MYELOMA PANEL, SERUM
Albumin SerPl Elph-Mcnc: 3.6 g/dL (ref 2.9–4.4)
Albumin/Glob SerPl: 1.2 (ref 0.7–1.7)
Alpha 1: 0.3 g/dL (ref 0.0–0.4)
Alpha2 Glob SerPl Elph-Mcnc: 1 g/dL (ref 0.4–1.0)
B-Globulin SerPl Elph-Mcnc: 0.8 g/dL (ref 0.7–1.3)
Gamma Glob SerPl Elph-Mcnc: 1 g/dL (ref 0.4–1.8)
Globulin, Total: 3.1 g/dL (ref 2.2–3.9)
IgA: 23 mg/dL — ABNORMAL LOW (ref 61–437)
IgG (Immunoglobin G), Serum: 1252 mg/dL (ref 603–1613)
IgM (Immunoglobulin M), Srm: 26 mg/dL (ref 20–172)
M Protein SerPl Elph-Mcnc: 0.9 g/dL — ABNORMAL HIGH
Total Protein ELP: 6.7 g/dL (ref 6.0–8.5)

## 2024-03-04 ENCOUNTER — Other Ambulatory Visit: Payer: Self-pay | Admitting: Physician Assistant

## 2024-03-04 DIAGNOSIS — C3492 Malignant neoplasm of unspecified part of left bronchus or lung: Secondary | ICD-10-CM

## 2024-03-05 NOTE — Telephone Encounter (Signed)
 No longer needed

## 2024-03-08 ENCOUNTER — Inpatient Hospital Stay

## 2024-03-08 ENCOUNTER — Other Ambulatory Visit: Payer: Self-pay | Admitting: Physician Assistant

## 2024-03-08 ENCOUNTER — Encounter: Payer: Self-pay | Admitting: Hematology and Oncology

## 2024-03-08 ENCOUNTER — Other Ambulatory Visit (HOSPITAL_COMMUNITY): Payer: Self-pay

## 2024-03-08 ENCOUNTER — Encounter (HOSPITAL_COMMUNITY)

## 2024-03-08 ENCOUNTER — Other Ambulatory Visit: Payer: Self-pay | Admitting: Pharmacist

## 2024-03-08 ENCOUNTER — Telehealth: Payer: Self-pay | Admitting: Pharmacist

## 2024-03-08 ENCOUNTER — Ambulatory Visit

## 2024-03-08 ENCOUNTER — Telehealth: Payer: Self-pay | Admitting: Pharmacy Technician

## 2024-03-08 ENCOUNTER — Other Ambulatory Visit: Payer: Self-pay

## 2024-03-08 ENCOUNTER — Other Ambulatory Visit: Payer: Self-pay | Admitting: Surgery

## 2024-03-08 ENCOUNTER — Inpatient Hospital Stay: Admitting: Hematology and Oncology

## 2024-03-08 VITALS — BP 149/78 | HR 67 | Temp 98.5°F | Resp 14 | Wt 236.5 lb

## 2024-03-08 DIAGNOSIS — E1122 Type 2 diabetes mellitus with diabetic chronic kidney disease: Secondary | ICD-10-CM | POA: Diagnosis not present

## 2024-03-08 DIAGNOSIS — Z5111 Encounter for antineoplastic chemotherapy: Secondary | ICD-10-CM | POA: Diagnosis not present

## 2024-03-08 DIAGNOSIS — C9000 Multiple myeloma not having achieved remission: Secondary | ICD-10-CM

## 2024-03-08 DIAGNOSIS — Z794 Long term (current) use of insulin: Secondary | ICD-10-CM

## 2024-03-08 DIAGNOSIS — M5440 Lumbago with sciatica, unspecified side: Secondary | ICD-10-CM

## 2024-03-08 DIAGNOSIS — D539 Nutritional anemia, unspecified: Secondary | ICD-10-CM | POA: Diagnosis not present

## 2024-03-08 DIAGNOSIS — C3492 Malignant neoplasm of unspecified part of left bronchus or lung: Secondary | ICD-10-CM

## 2024-03-08 DIAGNOSIS — N183 Type 2 diabetes mellitus with diabetic chronic kidney disease: Secondary | ICD-10-CM

## 2024-03-08 LAB — CMP (CANCER CENTER ONLY)
ALT: 9 U/L (ref 0–44)
AST: 13 U/L — ABNORMAL LOW (ref 15–41)
Albumin: 4.2 g/dL (ref 3.5–5.0)
Alkaline Phosphatase: 109 U/L (ref 38–126)
Anion gap: 5 (ref 5–15)
BUN: 16 mg/dL (ref 8–23)
CO2: 28 mmol/L (ref 22–32)
Calcium: 9.1 mg/dL (ref 8.9–10.3)
Chloride: 105 mmol/L (ref 98–111)
Creatinine: 1.34 mg/dL — ABNORMAL HIGH (ref 0.61–1.24)
GFR, Estimated: 57 mL/min — ABNORMAL LOW (ref >60)
Glucose, Bld: 131 mg/dL — ABNORMAL HIGH (ref 70–99)
Potassium: 4.6 mmol/L (ref 3.5–5.1)
Sodium: 138 mmol/L (ref 135–145)
Total Bilirubin: 0.4 mg/dL (ref 0.0–1.2)
Total Protein: 7.1 g/dL (ref 6.5–8.1)

## 2024-03-08 LAB — CBC WITH DIFFERENTIAL (CANCER CENTER ONLY)
Abs Immature Granulocytes: 0.03 K/uL (ref 0.00–0.07)
Basophils Absolute: 0 K/uL (ref 0.0–0.1)
Basophils Relative: 0 %
Eosinophils Absolute: 0.1 K/uL (ref 0.0–0.5)
Eosinophils Relative: 1 %
HCT: 32.9 % — ABNORMAL LOW (ref 39.0–52.0)
Hemoglobin: 10.8 g/dL — ABNORMAL LOW (ref 13.0–17.0)
Immature Granulocytes: 1 %
Lymphocytes Relative: 22 %
Lymphs Abs: 1.3 K/uL (ref 0.7–4.0)
MCH: 30.4 pg (ref 26.0–34.0)
MCHC: 32.8 g/dL (ref 30.0–36.0)
MCV: 92.7 fL (ref 80.0–100.0)
Monocytes Absolute: 0.6 K/uL (ref 0.1–1.0)
Monocytes Relative: 10 %
Neutro Abs: 4.2 K/uL (ref 1.7–7.7)
Neutrophils Relative %: 66 %
Platelet Count: 225 K/uL (ref 150–400)
RBC: 3.55 MIL/uL — ABNORMAL LOW (ref 4.22–5.81)
RDW: 12.9 % (ref 11.5–15.5)
WBC Count: 6.2 K/uL (ref 4.0–10.5)
nRBC: 0 % (ref 0.0–0.2)

## 2024-03-08 MED ORDER — BORTEZOMIB CHEMO SQ INJECTION 3.5 MG (2.5MG/ML)
1.3000 mg/m2 | Freq: Once | INTRAMUSCULAR | Status: AC
  Administered 2024-03-08: 3 mg via SUBCUTANEOUS
  Filled 2024-03-08: qty 1.2

## 2024-03-08 MED ORDER — LENALIDOMIDE 5 MG PO CAPS
ORAL_CAPSULE | ORAL | 11 refills | Status: DC
  Filled 2024-03-08: qty 21, fill #0

## 2024-03-08 MED ORDER — ACYCLOVIR 400 MG PO TABS: 400.0000 mg | ORAL_TABLET | Freq: Every day | ORAL | 2 refills | Status: AC

## 2024-03-08 MED ORDER — ACETAMINOPHEN 325 MG PO TABS
650.0000 mg | ORAL_TABLET | Freq: Once | ORAL | Status: AC
  Administered 2024-03-08: 650 mg via ORAL
  Filled 2024-03-08: qty 2

## 2024-03-08 MED ORDER — INSULIN GLARGINE 100 UNIT/ML ~~LOC~~ SOLN: 8.0000 [IU] | Freq: Every day | SUBCUTANEOUS | Status: DC

## 2024-03-08 MED ORDER — DARATUMUMAB-HYALURONIDASE-FIHJ 1800-30000 MG-UT/15ML ~~LOC~~ SOLN
1800.0000 mg | Freq: Once | SUBCUTANEOUS | Status: AC
  Administered 2024-03-08: 1800 mg via SUBCUTANEOUS
  Filled 2024-03-08: qty 15

## 2024-03-08 MED ORDER — DIPHENHYDRAMINE HCL 25 MG PO CAPS
50.0000 mg | ORAL_CAPSULE | Freq: Once | ORAL | Status: AC
  Administered 2024-03-08: 50 mg via ORAL
  Filled 2024-03-08: qty 2

## 2024-03-08 MED ORDER — LENALIDOMIDE 5 MG PO CAPS: ORAL_CAPSULE | ORAL | 0 refills | Status: DC

## 2024-03-08 NOTE — Telephone Encounter (Signed)
 Oral Oncology Patient Advocate Encounter   New authorization   Received notification that prior authorization for Lenalidomide  is required.   PA submitted on CMM via Latent Key [id number]  Status is pending     Brunetta Newingham ([name]) [name], CPhT  [facility], [name], [facility] - Oral Chemotherapy Patient Advocate Specialist III Phone: [phone]  Fax: [phone]

## 2024-03-08 NOTE — Progress Notes (Signed)
 [facility] OFFICE PROGRESS NOTE  Patient Care Team: Jarold Medici, MD as PCP - General (Internal Medicine) [name], RN as Oncology Nurse Navigator  Assessment & Plan Multiple myeloma not having achieved remission [facility]) The patient have prior diagnosis of smoldering myeloma.  He was referred to Dr. 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 PET/CT imaging was reviewed with the patient and his wife which show no evidence of significant bone involvement/fractures  So far, he tolerated treatment well except for intermittent reflux and high blood sugar on the days that he takes treatment He will continue to take acyclovir  for antimicrobial prophylaxis He had received Zometa  recently, next due in November I recommend dexamethasone  taper to 8 mg weekly due to side effects of dexamethasone  with high blood sugar and fluid retention We discussed the role of bone marrow transplant and for now, he is not keen for transplant evaluation Repeat myeloma panel showed positive response to therapy We discussed adding lenalidomide  for deeper response The risk, benefits, side effects of lenalidomide  were discussed with the patient and he agrees I will prescribe 5 mg dose to be taken daily for 21 days, rest 7 days, 4 cycles every 28 days Moving forward with his current regimen of daratumumab  and Velcade , I plan to space out his treatment to every other week Type 2 diabetes mellitus with stage 3 chronic kidney disease, with long-term current use of insulin , unspecified whether stage 3a or 3b CKD (HCC) This is improving with aggressive dietary modification I recommend reducing the dose of insulin  to 8  units/day I am hopeful we can get him off insulin  by the end of the year Acute back pain with sciatica, unspecified laterality This is improving Continue on oxycodone  taper Deficiency anemia Overall his anemia is improving He is aware that lenalidomide  can cause anemia Will monitor his blood counts carefully  Orders Placed This Encounter  Procedures   CBC with Differential (Cancer Center Only)    Standing Status:   Future    Expected Date:   04/05/2024    Expiration Date:   04/05/2025   CMP (Cancer Center only)    Standing Status:   Future    Expected Date:   04/05/2024    Expiration Date:   04/05/2025   CBC with Differential (Cancer Center Only)    Standing Status:   Future    Expected Date:   04/19/2024    Expiration Date:   04/19/2025   CMP (Cancer Center only)    Standing Status:   Future    Expected Date:   04/19/2024    Expiration Date:   04/19/2025   CBC with Differential (Cancer Center Only)    Standing Status:   Future    Expected Date:   05/03/2024    Expiration Date:   05/03/2025   CMP (Cancer Center only)    Standing Status:   Future    Expected Date:   05/03/2024    Expiration Date:   05/03/2025   CBC with Differential (Cancer Center Only)    Standing Status:   Future    Expected Date:   05/17/2024    Expiration Date:   05/17/2025   CMP (Cancer Center only)  Standing Status:   Future    Expected Date:   05/17/2024    Expiration Date:   05/17/2025   CBC with Differential (Cancer Center Only)    Standing Status:   Future    Expected Date:   05/31/2024    Expiration Date:   05/31/2025   CMP (Cancer Center only)    Standing Status:   Future    Expected Date:   05/31/2024    Expiration Date:   05/31/2025   CBC with Differential (Cancer Center Only)    Standing Status:   Future    Expected Date:   06/14/2024    Expiration Date:   06/14/2025   CMP (Cancer Center only)    Standing Status:   Future    Expected Date:   06/14/2024    Expiration Date:    06/14/2025   CBC with Differential (Cancer Center Only)    Standing Status:   Future    Expected Date:   06/28/2024    Expiration Date:   06/28/2025   CMP (Cancer Center only)    Standing Status:   Future    Expected Date:   06/28/2024    Expiration Date:   06/28/2025   CBC with Differential (Cancer Center Only)    Standing Status:   Future    Expected Date:   07/12/2024    Expiration Date:   07/12/2025   CMP (Cancer Center only)    Standing Status:   Future    Expected Date:   07/12/2024    Expiration Date:   07/12/2025   CBC with Differential (Cancer Center Only)    Standing Status:   Future    Expected Date:   07/26/2024    Expiration Date:   07/26/2025   CMP (Cancer Center only)    Standing Status:   Future    Expected Date:   07/26/2024    Expiration Date:   07/26/2025   CBC with Differential (Cancer Center Only)    Standing Status:   Future    Expected Date:   08/09/2024    Expiration Date:   08/09/2025   CMP (Cancer Center only)    Standing Status:   Future    Expected Date:   08/09/2024    Expiration Date:   08/09/2025   CBC with Differential (Cancer Center Only)    Standing Status:   Future    Expected Date:   08/23/2024    Expiration Date:   08/23/2025   CMP (Cancer Center only)    Standing Status:   Future    Expected Date:   08/23/2024    Expiration Date:   08/23/2025   CBC with Differential (Cancer Center Only)    Standing Status:   Future    Expected Date:   09/06/2024    Expiration Date:   09/06/2025   CMP (Cancer Center only)    Standing Status:   Future    Expected Date:   09/06/2024    Expiration Date:   09/06/2025     Almarie Bedford, MD  INTERVAL HISTORY: he returns for treatment follow-up Complications related to previous cycle of chemotherapy included anemia,, bone aches,, and intermittent hypoglycemia His back pain is getting better I reviewed his myeloma panel We discussed risk and benefits of adding lenalidomide  We discussed changes to his treatment plan  PHYSICAL  EXAMINATION: ECOG PERFORMANCE STATUS: 1 - Symptomatic but completely ambulatory  No results found for: [id number]    Latest Ref Rng & Units  03/08/2024    8:41 AM 02/29/2024   11:21 AM 02/16/2024    8:02 AM  CBC  WBC 4.0 - 10.5 K/uL 6.2  6.9  5.9   Hemoglobin 13.0 - 17.0 g/dL 89.1  89.3  89.9   Hematocrit 39.0 - 52.0 % 32.9  32.0  30.1   Platelets 150 - 400 K/uL 225  226  229       Chemistry      Component Value Date/Time   NA 138 03/08/2024 0841   NA 137 11/05/2022 1152   K 4.6 03/08/2024 0841   CL 105 03/08/2024 0841   CO2 28 03/08/2024 0841   BUN 16 03/08/2024 0841   BUN 11 11/05/2022 1152   CREATININE 1.34 (H) 03/08/2024 0841      Component Value Date/Time   CALCIUM  9.1 03/08/2024 0841   ALKPHOS 109 03/08/2024 0841   AST 13 (L) 03/08/2024 0841   ALT 9 03/08/2024 0841   BILITOT 0.4 03/08/2024 0841       There were no vitals filed for this visit. There were no vitals filed for this visit. Other relevant data reviewed during this visit included CBC, CMP, recent myeloma panel

## 2024-03-08 NOTE — Assessment & Plan Note (Addendum)
 This is improving with aggressive dietary modification I recommend reducing the dose of insulin  to 8 units/day I am hopeful we can get him off insulin  by the end of the year

## 2024-03-08 NOTE — Assessment & Plan Note (Addendum)
 The patient have prior diagnosis of smoldering myeloma.  He was referred to Dr. 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 PET/CT imaging was reviewed with the patient and his wife which show no evidence of significant bone involvement/fractures  So far, he tolerated treatment well except for intermittent reflux and high blood sugar on the days that he takes treatment He will continue to take acyclovir  for antimicrobial prophylaxis He had received Zometa  recently, next due in November I recommend dexamethasone  taper to 8 mg weekly due to side effects of dexamethasone  with high blood sugar and fluid retention We discussed the role of bone marrow transplant and for now, he is not keen for transplant evaluation Repeat myeloma panel showed positive response to therapy We discussed adding lenalidomide  for deeper response The risk, benefits, side effects of lenalidomide  were discussed with the patient and he agrees I will prescribe 5 mg dose to be taken daily for 21 days, rest 7 days, 4 cycles every 28 days Moving forward with his current regimen of daratumumab  and Velcade , I plan to space out his treatment to every other week

## 2024-03-08 NOTE — Patient Instructions (Signed)
 [facility]  Discharge Instructions: Thank you for choosing [facility] to provide your oncology and hematology care.   If you have a lab appointment with the Cancer Center, please go directly to the Cancer Center and check in at the registration area.   Wear comfortable clothing and clothing appropriate for easy access to any Portacath or PICC line.   We strive to give you quality time with your provider. You may need to reschedule your appointment if you arrive late (15 or more minutes).  Arriving late affects you and other patients whose appointments are after yours.  Also, if you miss three or more appointments without notifying the office, you may be dismissed from the clinic at the provider's discretion.      For prescription refill requests, have your pharmacy contact our office and allow 72 hours for refills to be completed.    Today you received the following chemotherapy and/or immunotherapy agents Velcade, Darzalex Faspro.    To help prevent nausea and vomiting after your treatment, we encourage you to take your nausea medication as directed.  BELOW ARE SYMPTOMS THAT SHOULD BE REPORTED IMMEDIATELY: *FEVER GREATER THAN 100.4 F (38 C) OR HIGHER *CHILLS OR SWEATING *NAUSEA AND VOMITING THAT IS NOT CONTROLLED WITH YOUR NAUSEA MEDICATION *UNUSUAL SHORTNESS OF BREATH *UNUSUAL BRUISING OR BLEEDING *URINARY PROBLEMS (pain or burning when urinating, or frequent urination) *BOWEL PROBLEMS (unusual diarrhea, constipation, pain near the anus) TENDERNESS IN MOUTH AND THROAT WITH OR WITHOUT PRESENCE OF ULCERS (sore throat, sores in mouth, or a toothache) UNUSUAL RASH, SWELLING OR PAIN  UNUSUAL VAGINAL DISCHARGE OR ITCHING   Items with * indicate a potential emergency and should be followed up as soon as possible or go to the Emergency Department if any problems should occur.  Please show the CHEMOTHERAPY ALERT CARD or  IMMUNOTHERAPY ALERT CARD at check-in to the Emergency Department and triage nurse.  Should you have questions after your visit or need to cancel or reschedule your appointment, please contact [facility]  Dept: [phone]  and follow the prompts.  Office hours are 8:00 a.m. to 4:30 p.m. Monday - Friday. Please note that voicemails left after 4:00 p.m. may not be returned until the following business day.  We are closed weekends and major holidays. You have access to a nurse at all times for urgent questions. Please call the main number to the clinic Dept: [phone] and follow the prompts.   For any non-urgent questions, you may also contact your provider using MyChart. We now offer e-Visits for anyone 86 and older to request care online for non-urgent symptoms. For details visit [email or web address].   Also download the MyChart app! Go to the app store, search "MyChart", open the app, select [facility], and log in with your MyChart username and password.

## 2024-03-08 NOTE — Telephone Encounter (Addendum)
 Oral Oncology Patient Advocate Encounter  Was successful in finding patient's grant information. There is an active $8000 grant from [employer] to provide copayment coverage for Lenalidomide .  This will keep the out of pocket expense at $0.     [employer] ID: [id number]   The billing information is as follows and will be shared with [facility].    RxBin: [id number] PCN: [id number] Member ID: [id number] Group ID: [id number] Dates of Eligibility: 01/11/2024 through 01/09/2025  Fund:  Multiple Myeloma - Medicare Access  [place] ([name]) [name], CPhT  [facility], [name], [facility] - Oral Chemotherapy Patient Advocate Specialist III Phone: [phone]  Fax: [phone]

## 2024-03-08 NOTE — Telephone Encounter (Signed)
 No longer needed. Not on alimta  anymore

## 2024-03-08 NOTE — Telephone Encounter (Signed)
 Oral Oncology Patient Advocate Encounter  Prior Authorization for Lenalidomide  has been approved.    PA# [id number]  Effective dates: 03/08/2024 through 03/08/2025  Patients co-pay is $397.20.    William Mcguire ([name]) [name], CPhT  [facility], [name], [facility] - Oral Chemotherapy Patient Advocate Specialist III Phone: [phone]  Fax: [phone]

## 2024-03-08 NOTE — Assessment & Plan Note (Addendum)
 This is improving Continue on oxycodone  taper

## 2024-03-08 NOTE — Progress Notes (Signed)
 Patient states he took dexamethasone  at 0715 today.

## 2024-03-08 NOTE — Assessment & Plan Note (Addendum)
 Overall his anemia is improving He is aware that lenalidomide  can cause anemia Will monitor his blood counts carefully

## 2024-03-08 NOTE — Telephone Encounter (Signed)
 [facility]    Oncology Clinical Pharmacist Practitioner Initial Assessment  Received new prescription for lenalidomide  (Revlimid ) for the treatment of multiple myeloma. This is being given in combination as part of  DaraVRd regimen . It is planned to continue until treatment plan completion or unacceptable toxicity.  Labs from 03/08/24 assessed, serum creatinine and hemoglobin stable. Prescription dose and frequency assessed.   Current medication list in Epic reviewed. Significant DDIs with lenalidomide  identified:No.  Evaluated chart, patient barriers to medication adherence identified: No.  Patient agreement for treatment documented in MD note on 03/08/24. Patient is on apixaban .  Prescription has been e-scribed to the [facility]) for benefits analysis and approval.  Oral Oncology Clinic will continue to follow for insurance authorization, copayment issues, initial counseling and start date.  Maribel Luis A. Lucila, PharmD, BCOP, CPP Hematology-Oncology Clinical Pharmacist Practitioner  03/08/2024 1:52 PM  **Disclaimer: This note was dictated with voice recognition software. Similar sounding words can inadvertently be transcribed and this note may contain transcription errors which may not have been corrected upon publication of note.**

## 2024-03-09 ENCOUNTER — Encounter: Payer: Self-pay | Admitting: Hematology and Oncology

## 2024-03-09 ENCOUNTER — Other Ambulatory Visit (HOSPITAL_COMMUNITY): Payer: Self-pay

## 2024-03-09 NOTE — Telephone Encounter (Signed)
 Oral Oncology Patient Advocate Encounter  [facility] pharmacy encountered an error with REMs authorization. I have reached out to [facility], RN, via staff message, to remedy the issue.  William Mcguire ([name]) [name], CPhT  [facility], [name], [facility] - Oral Chemotherapy Patient Advocate Specialist III Phone: [phone]  Fax: [phone]

## 2024-03-09 NOTE — Telephone Encounter (Signed)
 Oral Oncology Patient Advocate Encounter  Per [facility] representative delivery for this medication was been scheduled for 03/10/2024. Patient has a $0 copay.  William Mcguire ([name]) [name], CPhT  [facility], [name], [facility] - Oral Chemotherapy Patient Advocate Specialist III Phone: [phone]  Fax: [phone]

## 2024-03-10 ENCOUNTER — Encounter: Payer: Self-pay | Admitting: Pharmacist

## 2024-03-10 ENCOUNTER — Telehealth: Payer: Self-pay | Admitting: Pharmacist

## 2024-03-10 NOTE — Telephone Encounter (Signed)
 [facility]       Telephone: [phone]?Fax: [phone]   Oncology Clinical Pharmacist Practitioner Initial Assessment  William Mcguire is a 68 y.o. male with a diagnosis of multiple myeloma cancer. They were contacted today via telephone visit.  I connected with William Mcguire today by telephone and verified that I was speaking with the correct person using two patient identifiers. I discussed the limitations, risks, security and privacy concerns of performing an evaluation and management service by telemedicine and the availability of in-person appointments. The patient/caregiver expressed understanding and agreed to proceed.  Other persons participating in the visit and their role in the encounter: none   Patient's location: home  Provider's location: clinic   Indication/Regimen Lenalidomide  (Revlimid ) is being used appropriately for treatment of multiple myeloma by Dr. Almarie Bedford.      Wt Readings from Last 1 Encounters:  03/08/24 236 lb 8 oz (107.3 kg)    Estimated body surface area is 2.38 meters squared as calculated from the following:   Height as of 03/01/24: 6' 3 (1.905 m).   Weight as of 03/08/24: 236 lb 8 oz (107.3 kg).  The dosing regimen is 1 capsule (5 mg total) by mouth daily on days 1 to 21 of a 28-day cycle. This is being given as DaraVrd. It is planned to continue until disease progression or unacceptable toxicity. Prescription dose and frequency assessed for appropriateness. The treatment goal is: Control.  Patient has agreed to treatment which is documented in physician note on 03/08/24. Counseled patient on administration, dosing, side effects, monitoring, drug-food interactions, safe handling, storage, and disposal.  [facility] will send lenalidomide  to patient and Dr. Bedford is okay with him starting once received. She sees him again on 04/05/24 and he receives next cycle of IV chemotherapy on 03/22/24 with labs prior  Dose  Modifications Per Dr. Bedford, lenalidomide  at 5 mg daily days 1-21, followed by a 7 day rest period   Access Assessment William Mcguire will be receiving lenalidomide  through [facility] Insurance Concerns: none Start date if known: TBD  Adherence Assessment Reviewed importance on keeping a med schedule and plan for any missed doses Barriers to adherence identified? No  Communication and Learning Assessment Primary learner: patient Barriers to learning: No barriers Preferred language: English Learning preferences: Listening   Allergies No Known Allergies  Vitals    03/08/2024    8:50 AM 03/08/2024    8:46 AM 03/01/2024    9:07 AM  Oncology Vitals  Weight  107.276 kg   Weight (lbs)  236 lbs 8 oz   BMI  29.56 kg/m2   Temp 98.5 F (36.9 C)    Pulse Rate 67    BP 149/78  166/81  Resp 14    SpO2 100 %    BSA (m2)  2.38 m2      Laboratory Data    Latest Ref Rng & Units 03/08/2024    8:41 AM 02/29/2024   11:21 AM 02/16/2024    8:02 AM  CBC EXTENDED  WBC 4.0 - 10.5 K/uL 6.2  6.9  5.9   RBC 4.22 - 5.81 MIL/uL 3.55  3.44  3.24   Hemoglobin 13.0 - 17.0 g/dL 89.1  89.3  89.9   HCT 39.0 - 52.0 % 32.9  32.0  30.1   Platelets 150 - 400 K/uL 225  226  229   NEUT# 1.7 - 7.7 K/uL 4.2  5.2  4.0   Lymph# 0.7 - 4.0 K/uL 1.3  1.1  1.2        Latest Ref Rng & Units 03/08/2024    8:41 AM 02/29/2024   11:21 AM 02/16/2024    8:02 AM  CMP  Glucose 70 - 99 mg/dL 868  819  874   BUN 8 - 23 mg/dL 16  14  12    Creatinine 0.61 - 1.24 mg/dL 8.65  8.60  8.72   Sodium 135 - 145 mmol/L 138  138  137   Potassium 3.5 - 5.1 mmol/L 4.6  4.4  4.4   Chloride 98 - 111 mmol/L 105  106  106   CO2 22 - 32 mmol/L 28  26  28    Calcium  8.9 - 10.3 mg/dL 9.1  8.8  9.0   Total Protein 6.5 - 8.1 g/dL 7.1  7.3  6.6   Total Bilirubin 0.0 - 1.2 mg/dL 0.4  0.4  0.4   Alkaline Phos 38 - 126 U/L 109  97  104   AST 15 - 41 U/L 13  16  18    ALT 0 - 44 U/L 9  10  13     Lab Results  Component  Value Date   MG 2.0 03/27/2023   No results found for: CA2729   Contraindications Contraindications were reviewed? Yes Contraindications to therapy were identified? No   Safety Precautions The following safety precautions for the use of lenalidomide  were reviewed:  Fever: reviewed the importance of having a thermometer and the Centers for Disease Control and Prevention (CDC) definition of fever which is 100.62F (38C) or higher. Patient should call 24/7 triage at [phone] if experiencing a fever or any other symptoms Birth defects Changes in liver function Constipation Decreased hemoglobin, part of the red blood cells that carry iron and oxygen Decreased platelet count and increased risk for bleeding Decreased white blood cells (WBCs) and increased risk for infection Diarrhea (loose and/or urgent bowel movements) Fatigue Nausea or vomiting Rash or itchy skin Secondary malignancy Severe allergic reactions Tumor flare Tumor lysis syndrome (TLS) risk VTE - patient counseled on importance VTE prophylaxis, currently on apixaban . Do not donate sperm while on this agent and for 4 weeks after stopping this medication Do not breastfeed while on this agent and for 4 weeks after stopping this medication Do not donate blood or blood products while on this agent and for at least 4 weeks after stopping this medication Missed doses -- do not take the missed dose if it has been more than 12 hours since you should have taken it. Simply take the next dose at the regularly scheduled time. Do not take two doses at a time. Handing body fluids and waste Pregnancy, sexual activity, and contraception Storage and handling  Medication Reconciliation Current Outpatient Medications  Medication Sig Dispense Refill   acyclovir  (ZOVIRAX ) 400 MG tablet Take 1 tablet (400 mg total) by mouth daily. 90 tablet 2   atorvastatin  (LIPITOR) 40 MG tablet TAKE 1 TABLET BY MOUTH EVERY DAY 90 tablet 1   B-D  ULTRAFINE III SHORT PEN 31G X 8 MM MISC Inject into the skin as directed.     calcium  carbonate (TUMS EX) 750 MG chewable tablet Chew 1 tablet by mouth daily.     Continuous Glucose Sensor (DEXCOM G7 SENSOR) MISC USE AS DIRECTED TO CHECK BLOOD SUGARS. CHANGE EVERY 10 DAYS 1 each 3   cyanocobalamin  (VITAMIN B12) 1000 MCG/ML injection Inject 1 mL (1,000 mcg total) into the muscle every 30 (thirty) days. 9 mL 1  dexamethasone  (DECADRON ) 4 MG tablet Take 2 tabs in the morning with breakfast once every week before chemotherapy     Dulaglutide  (TRULICITY ) 1.5 MG/0.5ML SOAJ Inject 1.5 mg into the skin every Sunday.     ELIQUIS  5 MG TABS tablet TAKE 1 TABLET BY MOUTH TWICE A DAY 60 tablet 2   enalapril  (VASOTEC ) 10 MG tablet TAKE 1 TABLET BY MOUTH EVERY DAY 90 tablet 0   folic acid  (FOLVITE ) 1 MG tablet TAKE 1 TABLET BY MOUTH EVERY DAY 90 tablet 1   glucose blood (ONETOUCH VERIO) test strip Use as instructed to check blood sugars twice daily E11.69 100 each 2   glucose blood test strip Use as instructed to test blood sugar 3 times a day. Dx code: e11.65 100 each 2   insulin  glargine (LANTUS ) 100 UNIT/ML injection Inject 0.08 mLs (8 Units total) into the skin at bedtime.     lenalidomide  (REVLIMID ) 5 MG capsule Take 1 capsule daily for 21 days, then off 7 days for cycle of every 28 days 21 capsule 0   omeprazole  (PRILOSEC) 40 MG capsule TAKE 1 CAPSULE (40 MG TOTAL) BY MOUTH DAILY. 90 capsule 1   ondansetron  (ZOFRAN ) 8 MG tablet Take 8 mg by mouth every 8 (eight) hours as needed for nausea or vomiting.     oxyCODONE  10 MG TABS Take 1 tablet (10 mg total) by mouth every 4 (four) hours as needed for severe pain (pain score 7-10). 60 tablet 0   prochlorperazine  (COMPAZINE ) 10 MG tablet Take 10 mg by mouth every 6 (six) hours as needed for nausea or vomiting.     sildenafil  (VIAGRA ) 100 MG tablet Take 1 tablet (100 mg total) by mouth as needed for erectile dysfunction. TAKE 1 TABLET BY MOUTH EVERY DAY prn 10  tablet 2   SYRINGE-NEEDLE, DISP, 3 ML (B-D INTEGRA SYRINGE) 25G X 5/8 3 ML MISC USE AS DIRECTED 12 each 24   No current facility-administered medications for this visit.    Medication reconciliation is based on the patient's most recent medication list in the electronic medical record (EMR) including herbal products and OTC medications.   The patient's medication list was reviewed today with the patient? Yes   Drug-drug interactions (DDIs) DDIs were evaluated? Yes Significant DDIs identified? Dexamethasone  being given along with lenalidomide , bortezomib , and daratumumab . Patient on apixaban   Drug-Food Interactions Drug-food interactions were evaluated? Yes Drug-food interactions identified? No   Follow-up Plan  Patient education handout given to patient Start lenalidomide  1 capsule (5 mg total) by mouth daily on days 1-21 of a 28-day cycle. Per Dr. Lonn, start once received from [facility]. Continue DaraVRd, next cycle due 03/22/24 Monitor for side effects Distress thermometer completed during telephone call and reviewed with patient. Not initial treatment so referral has not been sent. William Mcguire can follow up with clinical pharmacy as deemed necessary by Dr. Almarie Lonn going forward   William Mcguire participated in the discussion, expressed understanding, and voiced agreement with the above plan. All questions were answered to their satisfaction. The patient was advised to contact the clinic at [phone] with any questions or concerns prior to their return visit.   I spent 30 minutes assessing the patient.  Akeia Perot A. Lucila, PharmD, BCOP, CPP  Norleen DELENA Lucila, RPH-CPP, 03/10/2024 1:15 PM  **Disclaimer: This note was dictated with voice recognition software. Similar sounding words can inadvertently be transcribed and this note may contain transcription errors which may not have been corrected upon publication of note.**

## 2024-03-11 ENCOUNTER — Telehealth: Payer: Self-pay

## 2024-03-11 NOTE — Telephone Encounter (Signed)
 S/w patient to request patient signature on form for new Lenalidomide  prescription. Patient offerred to have form sent electronically or patient could come in to sign document. Patient states that he would like to come in on Monday, 9/22 to sign the form.  Form placed in envelope at front desk with patient's name and DOB. Patient aware that he can come in at any time on Monday to fill out the form located at the front desk and he should inform the staff when he has completed the form. Patient verbalized an understanding of the information and confirmed that he would stop by on Monday to sign the form.

## 2024-03-14 ENCOUNTER — Ambulatory Visit (HOSPITAL_COMMUNITY)
Admission: RE | Admit: 2024-03-14 | Discharge: 2024-03-14 | Disposition: A | Discharge status: Home / Self Care | Source: Ambulatory Visit | Attending: Physician Assistant | Admitting: Physician Assistant

## 2024-03-14 DIAGNOSIS — I82422 Acute embolism and thrombosis of left iliac vein: Secondary | ICD-10-CM | POA: Diagnosis not present

## 2024-03-19 ENCOUNTER — Other Ambulatory Visit: Payer: Self-pay | Admitting: Hematology and Oncology

## 2024-03-19 DIAGNOSIS — I8290 Acute embolism and thrombosis of unspecified vein: Secondary | ICD-10-CM

## 2024-03-19 DIAGNOSIS — I2699 Other pulmonary embolism without acute cor pulmonale: Secondary | ICD-10-CM

## 2024-03-21 ENCOUNTER — Encounter: Payer: Self-pay | Admitting: Hematology and Oncology

## 2024-03-21 DIAGNOSIS — K080 Exfoliation of teeth due to systemic causes: Secondary | ICD-10-CM | POA: Diagnosis not present

## 2024-03-22 ENCOUNTER — Other Ambulatory Visit: Payer: Self-pay

## 2024-03-22 ENCOUNTER — Inpatient Hospital Stay

## 2024-03-22 VITALS — BP 128/77 | HR 72 | Temp 98.2°F | Resp 16 | Ht 75.0 in | Wt 234.5 lb

## 2024-03-22 DIAGNOSIS — C9000 Multiple myeloma not having achieved remission: Secondary | ICD-10-CM

## 2024-03-22 DIAGNOSIS — Z5111 Encounter for antineoplastic chemotherapy: Secondary | ICD-10-CM | POA: Diagnosis not present

## 2024-03-22 LAB — CMP (CANCER CENTER ONLY)
ALT: 13 U/L (ref 0–44)
AST: 17 U/L (ref 15–41)
Albumin: 4.2 g/dL (ref 3.5–5.0)
Alkaline Phosphatase: 112 U/L (ref 38–126)
Anion gap: 3 — ABNORMAL LOW (ref 5–15)
BUN: 15 mg/dL (ref 8–23)
CO2: 28 mmol/L (ref 22–32)
Calcium: 9.5 mg/dL (ref 8.9–10.3)
Chloride: 106 mmol/L (ref 98–111)
Creatinine: 1.45 mg/dL — ABNORMAL HIGH (ref 0.61–1.24)
GFR, Estimated: 52 mL/min — ABNORMAL LOW (ref >60)
Glucose, Bld: 139 mg/dL — ABNORMAL HIGH (ref 70–99)
Potassium: 4.6 mmol/L (ref 3.5–5.1)
Sodium: 137 mmol/L (ref 135–145)
Total Bilirubin: 0.5 mg/dL (ref 0.0–1.2)
Total Protein: 6.9 g/dL (ref 6.5–8.1)

## 2024-03-22 LAB — CBC WITH DIFFERENTIAL (CANCER CENTER ONLY)
Abs Immature Granulocytes: 0.03 K/uL (ref 0.00–0.07)
Basophils Absolute: 0 K/uL (ref 0.0–0.1)
Basophils Relative: 0 %
Eosinophils Absolute: 0.1 K/uL (ref 0.0–0.5)
Eosinophils Relative: 1 %
HCT: 31.8 % — ABNORMAL LOW (ref 39.0–52.0)
Hemoglobin: 10.6 g/dL — ABNORMAL LOW (ref 13.0–17.0)
Immature Granulocytes: 1 %
Lymphocytes Relative: 19 %
Lymphs Abs: 1.2 K/uL (ref 0.7–4.0)
MCH: 30.5 pg (ref 26.0–34.0)
MCHC: 33.3 g/dL (ref 30.0–36.0)
MCV: 91.6 fL (ref 80.0–100.0)
Monocytes Absolute: 0.5 K/uL (ref 0.1–1.0)
Monocytes Relative: 7 %
Neutro Abs: 4.5 K/uL (ref 1.7–7.7)
Neutrophils Relative %: 72 %
Platelet Count: 232 K/uL (ref 150–400)
RBC: 3.47 MIL/uL — ABNORMAL LOW (ref 4.22–5.81)
RDW: 12.4 % (ref 11.5–15.5)
WBC Count: 6.3 K/uL (ref 4.0–10.5)
nRBC: 0 % (ref 0.0–0.2)

## 2024-03-22 MED ORDER — BORTEZOMIB CHEMO SQ INJECTION 3.5 MG (2.5MG/ML)
1.3000 mg/m2 | Freq: Once | INTRAMUSCULAR | Status: AC
  Administered 2024-03-22: 3 mg via SUBCUTANEOUS
  Filled 2024-03-22: qty 1.2

## 2024-03-22 MED ORDER — PROCHLORPERAZINE MALEATE 10 MG PO TABS
10.0000 mg | ORAL_TABLET | Freq: Once | ORAL | Status: AC
  Administered 2024-03-22: 10 mg via ORAL
  Filled 2024-03-22: qty 1

## 2024-03-22 MED ORDER — LENALIDOMIDE 5 MG PO CAPS: ORAL_CAPSULE | ORAL | 0 refills | Status: DC

## 2024-03-22 NOTE — Patient Instructions (Signed)
 [facility] - A DEPT OF [facility]  Discharge Instructions: Thank you for choosing [facility] to provide your oncology and hematology care.   If you have a lab appointment with the Cancer Center, please go directly to the Cancer Center and check in at the registration area.   Wear comfortable clothing and clothing appropriate for easy access to any Portacath or PICC line.   We strive to give you quality time with your provider. You may need to reschedule your appointment if you arrive late (15 or more minutes).  Arriving late affects you and other patients whose appointments are after yours.  Also, if you miss three or more appointments without notifying the office, you may be dismissed from the clinic at the provider's discretion.      For prescription refill requests, have your pharmacy contact our office and allow 72 hours for refills to be completed.    Today you received the following chemotherapy and/or immunotherapy agent: Bortezomib (Velcade)      To help prevent nausea and vomiting after your treatment, we encourage you to take your nausea medication as directed.  BELOW ARE SYMPTOMS THAT SHOULD BE REPORTED IMMEDIATELY: *FEVER GREATER THAN 100.4 F (38 C) OR HIGHER *CHILLS OR SWEATING *NAUSEA AND VOMITING THAT IS NOT CONTROLLED WITH YOUR NAUSEA MEDICATION *UNUSUAL SHORTNESS OF BREATH *UNUSUAL BRUISING OR BLEEDING *URINARY PROBLEMS (pain or burning when urinating, or frequent urination) *BOWEL PROBLEMS (unusual diarrhea, constipation, pain near the anus) TENDERNESS IN MOUTH AND THROAT WITH OR WITHOUT PRESENCE OF ULCERS (sore throat, sores in mouth, or a toothache) UNUSUAL RASH, SWELLING OR PAIN  UNUSUAL VAGINAL DISCHARGE OR ITCHING   Items with * indicate a potential emergency and should be followed up as soon as possible or go to the Emergency Department if any problems should occur.  Please show the CHEMOTHERAPY ALERT CARD or  IMMUNOTHERAPY ALERT CARD at check-in to the Emergency Department and triage nurse.  Should you have questions after your visit or need to cancel or reschedule your appointment, please contact [facility]  Dept: [phone]  and follow the prompts.  Office hours are 8:00 a.m. to 4:30 p.m. Monday - Friday. Please note that voicemails left after 4:00 p.m. may not be returned until the following business day.  We are closed weekends and major holidays. You have access to a nurse at all times for urgent questions. Please call the main number to the clinic Dept: [phone] and follow the prompts.   For any non-urgent questions, you may also contact your provider using MyChart. We now offer e-Visits for anyone 55 and older to request care online for non-urgent symptoms. For details visit [email or web address].   Also download the MyChart app! Go to the app store, search "MyChart", open the app, select [place], and log in with your MyChart username and password.

## 2024-03-22 NOTE — Progress Notes (Signed)
 Pt. states he took Decadron  8 mg po at home this am.

## 2024-03-29 ENCOUNTER — Encounter (HOSPITAL_COMMUNITY)

## 2024-03-29 ENCOUNTER — Ambulatory Visit

## 2024-04-05 ENCOUNTER — Inpatient Hospital Stay: Admitting: Hematology and Oncology

## 2024-04-05 ENCOUNTER — Other Ambulatory Visit: Payer: Self-pay | Admitting: Internal Medicine

## 2024-04-05 ENCOUNTER — Encounter: Payer: Self-pay | Admitting: Hematology and Oncology

## 2024-04-05 ENCOUNTER — Inpatient Hospital Stay

## 2024-04-05 ENCOUNTER — Inpatient Hospital Stay: Attending: Hematology and Oncology

## 2024-04-05 ENCOUNTER — Other Ambulatory Visit: Payer: Self-pay | Admitting: Hematology and Oncology

## 2024-04-05 ENCOUNTER — Other Ambulatory Visit: Payer: Self-pay | Admitting: Physician Assistant

## 2024-04-05 VITALS — BP 147/67 | HR 60 | Temp 97.7°F | Resp 16 | Wt 233.5 lb

## 2024-04-05 DIAGNOSIS — C9000 Multiple myeloma not having achieved remission: Secondary | ICD-10-CM | POA: Insufficient documentation

## 2024-04-05 DIAGNOSIS — E1165 Type 2 diabetes mellitus with hyperglycemia: Secondary | ICD-10-CM | POA: Insufficient documentation

## 2024-04-05 DIAGNOSIS — Z5112 Encounter for antineoplastic immunotherapy: Secondary | ICD-10-CM | POA: Insufficient documentation

## 2024-04-05 DIAGNOSIS — C3492 Malignant neoplasm of unspecified part of left bronchus or lung: Secondary | ICD-10-CM

## 2024-04-05 DIAGNOSIS — E1122 Type 2 diabetes mellitus with diabetic chronic kidney disease: Secondary | ICD-10-CM

## 2024-04-05 DIAGNOSIS — Z7901 Long term (current) use of anticoagulants: Secondary | ICD-10-CM | POA: Diagnosis not present

## 2024-04-05 DIAGNOSIS — D539 Nutritional anemia, unspecified: Secondary | ICD-10-CM | POA: Insufficient documentation

## 2024-04-05 DIAGNOSIS — Z86711 Personal history of pulmonary embolism: Secondary | ICD-10-CM | POA: Diagnosis not present

## 2024-04-05 DIAGNOSIS — N183 Chronic kidney disease, stage 3 unspecified: Secondary | ICD-10-CM | POA: Diagnosis not present

## 2024-04-05 DIAGNOSIS — Z794 Long term (current) use of insulin: Secondary | ICD-10-CM

## 2024-04-05 DIAGNOSIS — I2694 Multiple subsegmental pulmonary emboli without acute cor pulmonale: Secondary | ICD-10-CM | POA: Diagnosis not present

## 2024-04-05 DIAGNOSIS — Z5111 Encounter for antineoplastic chemotherapy: Secondary | ICD-10-CM | POA: Insufficient documentation

## 2024-04-05 DIAGNOSIS — Z86718 Personal history of other venous thrombosis and embolism: Secondary | ICD-10-CM | POA: Insufficient documentation

## 2024-04-05 DIAGNOSIS — Z23 Encounter for immunization: Secondary | ICD-10-CM | POA: Diagnosis not present

## 2024-04-05 LAB — CMP (CANCER CENTER ONLY)
ALT: 13 U/L (ref 0–44)
AST: 22 U/L (ref 15–41)
Albumin: 4.1 g/dL (ref 3.5–5.0)
Alkaline Phosphatase: 100 U/L (ref 38–126)
Anion gap: 5 (ref 5–15)
BUN: 10 mg/dL (ref 8–23)
CO2: 28 mmol/L (ref 22–32)
Calcium: 9.7 mg/dL (ref 8.9–10.3)
Chloride: 105 mmol/L (ref 98–111)
Creatinine: 1.31 mg/dL — ABNORMAL HIGH (ref 0.61–1.24)
GFR, Estimated: 59 mL/min — ABNORMAL LOW (ref >60)
Glucose, Bld: 89 mg/dL (ref 70–99)
Potassium: 4 mmol/L (ref 3.5–5.1)
Sodium: 138 mmol/L (ref 135–145)
Total Bilirubin: 0.6 mg/dL (ref 0.0–1.2)
Total Protein: 6.7 g/dL (ref 6.5–8.1)

## 2024-04-05 LAB — CBC WITH DIFFERENTIAL (CANCER CENTER ONLY)
Abs Immature Granulocytes: 0.02 K/uL (ref 0.00–0.07)
Basophils Absolute: 0 K/uL (ref 0.0–0.1)
Basophils Relative: 0 %
Eosinophils Absolute: 0.1 K/uL (ref 0.0–0.5)
Eosinophils Relative: 2 %
HCT: 30.6 % — ABNORMAL LOW (ref 39.0–52.0)
Hemoglobin: 10.1 g/dL — ABNORMAL LOW (ref 13.0–17.0)
Immature Granulocytes: 0 %
Lymphocytes Relative: 19 %
Lymphs Abs: 0.9 K/uL (ref 0.7–4.0)
MCH: 30.1 pg (ref 26.0–34.0)
MCHC: 33 g/dL (ref 30.0–36.0)
MCV: 91.3 fL (ref 80.0–100.0)
Monocytes Absolute: 0.4 K/uL (ref 0.1–1.0)
Monocytes Relative: 8 %
Neutro Abs: 3.3 K/uL (ref 1.7–7.7)
Neutrophils Relative %: 71 %
Platelet Count: 162 K/uL (ref 150–400)
RBC: 3.35 MIL/uL — ABNORMAL LOW (ref 4.22–5.81)
RDW: 12.4 % (ref 11.5–15.5)
WBC Count: 4.7 K/uL (ref 4.0–10.5)
nRBC: 0 % (ref 0.0–0.2)

## 2024-04-05 MED ORDER — DARATUMUMAB-HYALURONIDASE-FIHJ 1800-30000 MG-UT/15ML ~~LOC~~ SOLN
1800.0000 mg | Freq: Once | SUBCUTANEOUS | Status: AC
  Administered 2024-04-05: 1800 mg via SUBCUTANEOUS
  Filled 2024-04-05: qty 15

## 2024-04-05 MED ORDER — INSULIN GLARGINE 100 UNIT/ML ~~LOC~~ SOLN: 5.0000 [IU] | Freq: Every day | SUBCUTANEOUS | Status: DC

## 2024-04-05 MED ORDER — INFLUENZA VAC SPLIT HIGH-DOSE 0.5 ML IM SUSY
0.5000 mL | PREFILLED_SYRINGE | Freq: Once | INTRAMUSCULAR | Status: AC
  Administered 2024-04-05: 0.5 mL via INTRAMUSCULAR
  Filled 2024-04-05: qty 0.5

## 2024-04-05 MED ORDER — ACETAMINOPHEN 325 MG PO TABS
650.0000 mg | ORAL_TABLET | Freq: Once | ORAL | Status: AC
  Administered 2024-04-05: 650 mg via ORAL
  Filled 2024-04-05: qty 2

## 2024-04-05 MED ORDER — DIPHENHYDRAMINE HCL 25 MG PO CAPS
25.0000 mg | ORAL_CAPSULE | Freq: Once | ORAL | Status: AC
  Administered 2024-04-05: 25 mg via ORAL
  Filled 2024-04-05: qty 1

## 2024-04-05 MED ORDER — BORTEZOMIB CHEMO SQ INJECTION 3.5 MG (2.5MG/ML)
1.3000 mg/m2 | Freq: Once | INTRAMUSCULAR | Status: AC
  Administered 2024-04-05: 3 mg via SUBCUTANEOUS
  Filled 2024-04-05: qty 1.2

## 2024-04-05 MED ORDER — DEXAMETHASONE 4 MG PO TABS: ORAL_TABLET | ORAL | Status: DC

## 2024-04-05 NOTE — Telephone Encounter (Signed)
 Not needed. Patient also no longer under my care

## 2024-04-05 NOTE — Patient Instructions (Signed)
 [facility] - A DEPT OF [facility] [place]  Discharge Instructions: Thank you for choosing [facility] to provide your oncology and hematology care.   If you have a lab appointment with the Cancer Center, please go directly to the Cancer Center and check in at the registration area.   Wear comfortable clothing and clothing appropriate for easy access to any Portacath or PICC line.   We strive to give you quality time with your provider. You may need to reschedule your appointment if you arrive late (15 or more minutes).  Arriving late affects you and other patients whose appointments are after yours.  Also, if you miss three or more appointments without notifying the office, you may be dismissed from the clinic at the provider's discretion.      For prescription refill requests, have your pharmacy contact our office and allow 72 hours for refills to be completed.    Today you received the following chemotherapy and/or immunotherapy agents darzalex  faspro, velcade       To help prevent nausea and vomiting after your treatment, we encourage you to take your nausea medication as directed.  BELOW ARE SYMPTOMS THAT SHOULD BE REPORTED IMMEDIATELY: *FEVER GREATER THAN 100.4 F (38 C) OR HIGHER *CHILLS OR SWEATING *NAUSEA AND VOMITING THAT IS NOT CONTROLLED WITH YOUR NAUSEA MEDICATION *UNUSUAL SHORTNESS OF BREATH *UNUSUAL BRUISING OR BLEEDING *URINARY PROBLEMS (pain or burning when urinating, or frequent urination) *BOWEL PROBLEMS (unusual diarrhea, constipation, pain near the anus) TENDERNESS IN MOUTH AND THROAT WITH OR WITHOUT PRESENCE OF ULCERS (sore throat, sores in mouth, or a toothache) UNUSUAL RASH, SWELLING OR PAIN  UNUSUAL VAGINAL DISCHARGE OR ITCHING   Items with * indicate a potential emergency and should be followed up as soon as possible or go to the Emergency Department if any problems should occur.  Please show the CHEMOTHERAPY ALERT CARD or  IMMUNOTHERAPY ALERT CARD at check-in to the Emergency Department and triage nurse.  Should you have questions after your visit or need to cancel or reschedule your appointment, please contact [facility] - A DEPT OF [facility]  Dept: [phone]  and follow the prompts.  Office hours are 8:00 a.m. to 4:30 p.m. Monday - Friday. Please note that voicemails left after 4:00 p.m. may not be returned until the following business day.  We are closed weekends and major holidays. You have access to a nurse at all times for urgent questions. Please call the main number to the clinic Dept: [phone] and follow the prompts.   For any non-urgent questions, you may also contact your provider using MyChart. We now offer e-Visits for anyone 67 and older to request care online for non-urgent symptoms. For details visit [email or web address].   Also download the MyChart app! Go to the app store, search MyChart, open the app, select [facility], and log in with your MyChart username and password.    Influenza (Flu) Vaccine (Inactivated or Recombinant): What You Need to Know Many vaccine information statements are available in Spanish and other languages. See PromoAge.com.br. 1. Why get vaccinated? Influenza vaccine can prevent influenza (flu). Flu is a contagious disease that spreads around the [place]  every year, usually between October and May. Anyone can get the flu, but it is more dangerous for some people. Infants and young children, people 53 years and older, pregnant people, and people with certain health conditions or a weakened immune system are  at greatest risk of flu complications. Pneumonia, bronchitis, sinus infections, and ear infections are examples of flu-related complications. If you have a medical condition, such as heart disease, cancer, or diabetes, flu can make it worse. Flu can cause fever and chills, sore throat, muscle aches, fatigue, cough,  headache, and runny or stuffy nose. Some people may have vomiting and diarrhea, though this is more common in children than adults. In an average year, thousands of people in the [place]  die from flu, and many more are hospitalized. Flu vaccine prevents millions of illnesses and flu-related visits to the doctor each year. 2. Influenza vaccines CDC recommends everyone 6 months and older get vaccinated every flu season. Children 6 months through 65 years of age may need 2 doses during a single flu season. Everyone else needs only 1 dose each flu season. It takes about 2 weeks for protection to develop after vaccination. There are many flu viruses, and they are always changing. Each year a new flu vaccine is made to protect against the influenza viruses believed to be likely to cause disease in the upcoming flu season. Even when the vaccine doesn't exactly match these viruses, it may still provide some protection. Influenza vaccine does not cause flu. Influenza vaccine may be given at the same time as other vaccines. 3. Talk with your health care provider Tell your vaccination provider if the person getting the vaccine: Has had an allergic reaction after a previous dose of influenza vaccine, or has any severe, life-threatening allergies Has ever had Guillain-Barr Syndrome (also called GBS) In some cases, your health care provider may decide to postpone influenza vaccination until a future visit. Influenza vaccine can be administered at any time during pregnancy. People who are or will be pregnant during influenza season should receive inactivated influenza vaccine. People with minor illnesses, such as a cold, may be vaccinated. People who are moderately or severely ill should usually wait until they recover before getting influenza vaccine. Your health care provider can give you more information. 4. Risks of a vaccine reaction Soreness, redness, and swelling where the shot is given, fever,  muscle aches, and headache can happen after influenza vaccination. There may be a very small increased risk of Guillain-Barr Syndrome (GBS) after inactivated influenza vaccine (the flu shot). Young children who get the flu shot along with pneumococcal vaccine (PCV13) and/or DTaP vaccine at the same time might be slightly more likely to have a seizure caused by fever. Tell your health care provider if a child who is getting flu vaccine has ever had a seizure. People sometimes faint after medical procedures, including vaccination. Tell your provider if you feel dizzy or have vision changes or ringing in the ears. As with any medicine, there is a very remote chance of a vaccine causing a severe allergic reaction, other serious injury, or death. 5. What if there is a serious problem? An allergic reaction could occur after the vaccinated person leaves the clinic. If you see signs of a severe allergic reaction (hives, swelling of the face and throat, difficulty breathing, a fast heartbeat, dizziness, or weakness), call 9-1-1 and get the person to the nearest hospital. For other signs that concern you, call your health care provider. Adverse reactions should be reported to the Vaccine Adverse Event Reporting System (VAERS). Your health care provider will usually file this report, or you can do it yourself. Visit the VAERS website at www.vaers.LAgents.no or call (786)672-2773. VAERS is only for reporting reactions, and VAERS staff members  do not give medical advice. 6. The National Vaccine Injury Compensation Program The [employer] Vaccine Injury Compensation Program (VICP) is a federal program that was created to compensate people who may have been injured by certain vaccines. Claims regarding alleged injury or death due to vaccination have a time limit for filing, which may be as short as two years. Visit the VICP website at SpiritualWord.at or call (424) 783-1894 to learn about the program and  about filing a claim. 7. How can I learn more? Ask your health care provider. Call your local or state health department. Visit the website of the Food and Drug Administration (FDA) for vaccine package inserts and additional information at FinderList.no. Contact the Centers for Disease Control and Prevention (CDC): Call 406 097 3064 (1-800-CDC-INFO) or Visit CDC's website at BiotechRoom.com.cy. Source: CDC Vaccine Information Statement Inactivated Influenza Vaccine (01/27/2020) This same material is available at FootballExhibition.com.br for no charge. This information is not intended to replace advice given to you by your health care provider. Make sure you discuss any questions you have with your health care provider. Document Revised: 09/24/2022 Document Reviewed: 06/30/2022 Elsevier Patient Education  2024 [employer].

## 2024-04-05 NOTE — Assessment & Plan Note (Addendum)
 This is improving with aggressive dietary modification I recommend reducing the dose of insulin  to 5 units/day I am hopeful we can get him off insulin  by the end of the year

## 2024-04-05 NOTE — Assessment & Plan Note (Addendum)
 The patient have prior diagnosis of smoldering myeloma.  He was referred to Dr. 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 PET/CT imaging was reviewed with the patient and his wife which show no evidence of significant bone involvement/fractures  So far, he tolerated treatment well except for intermittent reflux and high blood sugar on the days that he takes treatment We discussed the role of bone marrow transplant and for now, he is not keen for transplant evaluation Repeat myeloma panel showed positive response to therapy We discussed adding lenalidomide  for deeper response, started in September  We repeated myeloma panel today For now, he will continue lenalidomide  21 days on, 7 days off He will get daratumumab  every 28 days He will get Velcade  every other week He will take weekly dexamethasone  He will continue taking Eliquis  for DVT prophylaxis He will continue taking calcium , vitamin D  supplement and Zometa  every 3 months, next due in November We discussed the importance of preventive care and reviewed the vaccination programs. He does not have any prior allergic reactions to influenza vaccination. He agrees to proceed with influenza vaccination today and we will administer it today at the clinic.

## 2024-04-05 NOTE — Assessment & Plan Note (Addendum)
 Overall his anemia is improving He is aware that lenalidomide  can cause anemia Will monitor his blood counts carefully

## 2024-04-05 NOTE — Assessment & Plan Note (Addendum)
 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

## 2024-04-05 NOTE — Progress Notes (Signed)
 [facility] OFFICE PROGRESS NOTE  Patient Care Team: Jarold Medici, MD as PCP - General (Internal Medicine) [name], RN as Oncology Nurse Navigator Christiana Kerri HERO, RN as Registered Nurse  Assessment & Plan Multiple myeloma not having achieved remission [facility]) The patient have prior diagnosis of smoldering myeloma.  He was referred to Dr. 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 PET/CT imaging was reviewed with the patient and his wife which show no evidence of significant bone involvement/fractures  So far, he tolerated treatment well except for intermittent reflux and high blood sugar on the days that he takes treatment We discussed the role of bone marrow transplant and for now, he is not keen for transplant evaluation Repeat myeloma panel showed positive response to therapy We discussed adding lenalidomide  for deeper response, started in September  We repeated myeloma panel today For now, he will continue lenalidomide  21 days on, 7 days off He will get daratumumab  every 28 days He will get Velcade  every other week He will take weekly dexamethasone  He will continue taking Eliquis  for DVT prophylaxis He will continue taking calcium , vitamin D  supplement and Zometa  every 3 months, next due in November We discussed the importance of preventive care and reviewed the vaccination programs. He does not have any prior allergic reactions to influenza vaccination. He agrees to proceed with influenza vaccination today and we will administer it today at the clinic.  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    Almarie Bedford, MD  INTERVAL HISTORY: he returns for treatment follow-up Complications related to previous cycle of chemotherapy included anemia, He tolerated treatment very well He still have minor muscular strain from recent injury but he has not needed to take oxycodone  Denies recent infection We discussed plan moving forward especially in regards to his anticoagulation therapy We discussed medication taper with his insulin  PHYSICAL EXAMINATION: ECOG PERFORMANCE STATUS: 1 - Symptomatic but completely ambulatory  No results found for: CAN125    Latest Ref Rng & Units 04/05/2024    9:13 AM 03/22/2024    9:09 AM 03/08/2024    8:41 AM  CBC  WBC 4.0 - 10.5 K/uL 4.7  6.3  6.2   Hemoglobin 13.0 - 17.0 g/dL 89.8  89.3  89.1   Hematocrit 39.0 - 52.0 % 30.6  31.8  32.9   Platelets 150 - 400 K/uL 162  232  225       Chemistry      Component Value Date/Time   NA 138 04/05/2024 0913   NA 137 11/05/2022 1152   K 4.0 04/05/2024 0913   CL 105 04/05/2024 0913   CO2 28 04/05/2024 0913   BUN 10 04/05/2024 0913   BUN 11  11/05/2022 1152   CREATININE 1.31 (H) 04/05/2024 0913      Component Value Date/Time   CALCIUM  9.7 04/05/2024 0913   ALKPHOS 100 04/05/2024 0913   AST 22 04/05/2024 0913   ALT 13 04/05/2024 0913   BILITOT 0.6 04/05/2024 0913       There were no vitals filed for this visit. There were no vitals filed for this visit. Other relevant data reviewed during this visit included CBC and CMP

## 2024-04-06 ENCOUNTER — Ambulatory Visit: Attending: Vascular Surgery | Admitting: Physician Assistant

## 2024-04-06 ENCOUNTER — Encounter: Payer: Self-pay | Admitting: Physician Assistant

## 2024-04-06 VITALS — BP 160/85 | HR 67 | Temp 97.9°F | Wt 236.1 lb

## 2024-04-06 DIAGNOSIS — Z86718 Personal history of other venous thrombosis and embolism: Secondary | ICD-10-CM | POA: Diagnosis not present

## 2024-04-06 DIAGNOSIS — M7989 Other specified soft tissue disorders: Secondary | ICD-10-CM

## 2024-04-06 LAB — KAPPA/LAMBDA LIGHT CHAINS
Kappa free light chain: 47.6 mg/L — ABNORMAL HIGH (ref 3.3–19.4)
Kappa, lambda light chain ratio: 11.07 — ABNORMAL HIGH (ref 0.26–1.65)
Lambda free light chains: 4.3 mg/L — ABNORMAL LOW (ref 5.7–26.3)

## 2024-04-06 NOTE — Progress Notes (Signed)
 Office Note  History of Present Illness   William Mcguire is a 68 y.o. ([birth year]) male who presents for follow-up.  He has a history of left lower extremity and abdominal IVC venogram with mechanical thrombectomy of the IVC, left common iliac vein, left external iliac vein, left common femoral vein, and left femoral vein with left common iliac vein angioplasty and stenting on 06/26/2023 by Dr. Magda.  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  Postoperatively he was to be on aspirin  and DOAC for 6 months.  Unfortunately he experienced some bleeding issues, so he has only been on Eliquis .   He returns today for repeat follow-up.  He has no complaints at today's office visit.  He endorses intermittent left lower leg swelling, however this is not painful.  He does not wear his compression stockings.  He elevates his legs as needed for swelling.  He is followed by oncology for multiple myeloma.  They have recommended continued anticoagulation with Eliquis , likely lifelong.  Past Medical History:  Diagnosis Date   Diabetes mellitus    High cholesterol    Hypertension     Past Surgical History:  Procedure Laterality Date   BIOPSY OF SKIN SUBCUTANEOUS TISSUE AND/OR MUCOUS MEMBRANE  09/29/2023   Procedure: BIOPSY, SKIN, SUBCUTANEOUS TISSUE, OR MUCOUS MEMBRANE;  Surgeon: Elicia Claw, MD;  Location: [facility];  Service: Gastroenterology;;   BRONCHIAL BIOPSY  01/05/2023   Procedure: BRONCHIAL BIOPSIES;  Surgeon: Brenna Adine CROME, DO;  Location: [facility];  Service: Pulmonary;;   BRONCHIAL BRUSHINGS  01/05/2023   Procedure: BRONCHIAL BRUSHINGS;  Surgeon: Brenna Adine CROME, DO;  [facility];  Service: Pulmonary;;   BRONCHIAL NEEDLE ASPIRATION BIOPSY  01/05/2023   Procedure: BRONCHIAL NEEDLE ASPIRATION BIOPSIES;  Surgeon: Brenna Adine CROME, DO;  Location: [facility];  Service: Pulmonary;;   COLONOSCOPY N/A 09/29/2023   Procedure:  COLONOSCOPY;  Surgeon: Elicia Claw, MD;  [facility]: [facility];  Service: Gastroenterology;  Laterality: N/A;   ESOPHAGOGASTRODUODENOSCOPY N/A 09/29/2023   Procedure: EGD (ESOPHAGOGASTRODUODENOSCOPY);  Surgeon: Elicia Claw, MD;  Location: [facility];  Service: Gastroenterology;  Laterality: N/A;   INTERCOSTAL NERVE BLOCK Left 03/18/2023   Procedure: INTERCOSTAL NERVE BLOCK;  Surgeon: Shyrl Linnie KIDD, MD;  [facility]: [facility];  Service: Thoracic;  Laterality: Left;   KNEE SURGERY Left 2004   torn cartilage   LYMPH NODE BIOPSY Left 03/18/2023   Procedure: LYMPH NODE BIOPSY;  Surgeon: Shyrl Linnie KIDD, MD;  Location[facility];  Service: Thoracic;  Laterality: Left;   PATELLAR TENDON REPAIR  06/11/2011   Procedure: PATELLA TENDON REPAIR;  Surgeon: Cordella Glendia Hutchinson;  Location: [facility];  Service: Orthopedics;  Laterality: Right;   PERIPHERAL VASCULAR INTERVENTION  06/26/2023   Procedure: PERIPHERAL VASCULAR INTERVENTION;  Surgeon: Magda Debby SAILOR, MD;  [facility];  Service: Cardiovascular;;   PERIPHERAL VASCULAR THROMBECTOMY Left 06/26/2023   Procedure: PERIPHERAL VASCULAR THROMBECTOMY;  Surgeon: Magda Debby SAILOR, MD;  [facility];  Service: Cardiovascular;  Laterality: Left;   PERIPHERAL VASCULAR ULTRASOUND/IVUS  06/26/2023   Procedure: Peripheral Vascular Ultrasound/IVUS;  Surgeon: Magda Debby SAILOR, MD;  Location: [facility] CV LAB;  Service: Cardiovascular;;   POLYPECTOMY  09/29/2023   Procedure: POLYPECTOMY, STOMACH AND COLON;  Surgeon: Elicia Claw, MD;  [facility]: [facility];  Service: Gastroenterology;;    Social History   Socioeconomic History  Marital status: Married    Spouse name: Not on file   Number of children: Not on file   Years of education: Not on file   Highest education level: Not on file  Occupational History   Not on file  Tobacco Use   Smoking status: Never   Smokeless tobacco: Never   Tobacco comments:    n/a   Vaping Use   Vaping status: Never Used  Substance and Sexual Activity   Alcohol use: No   Drug use: No   Sexual activity: Not on file  Other Topics Concern   Not on file  Social History Narrative   Not on file   Social Drivers of Health   Financial Resource Strain: Low Risk  (11/11/2023)   Overall Financial Resource Strain (CARDIA)    Difficulty of Paying Living Expenses: Not hard at all  Food Insecurity: No Food Insecurity (11/11/2023)   Hunger Vital Sign    Worried About Running Out of Food in the Last Year: Never true    Ran Out of Food in the Last Year: Never true  Transportation Needs: No Transportation Needs (11/11/2023)   PRAPARE - Administrator, Civil Service (Medical): No    Lack of Transportation (Non-Medical): No  Physical Activity: Insufficiently Active (11/11/2023)   Exercise Vital Sign    Days of Exercise per Week: 1 day    Minutes of Exercise per Session: 60 min  Stress: No Stress Concern Present (11/11/2023)   [employer] of Occupational Health - Occupational Stress Questionnaire    Feeling of Stress : Not at all  Social Connections: Moderately Integrated (11/11/2023)   Social Connection and Isolation Panel    Frequency of Communication with Friends and Family: Three times a week    Frequency of Social Gatherings with Friends and Family: Twice a week    Attends Religious Services: More than 4 times per year    Active Member of [employer] or Organizations: No    Attends Banker Meetings: Never    Marital Status: Married  Catering manager Violence: Not At Risk (11/11/2023)   Humiliation, Afraid, Rape, and Kick questionnaire    Fear of Current or Ex-Partner: No    Emotionally Abused: No    Physically Abused: No    Sexually Abused: No    Family History  Problem Relation Age of Onset   Hypertension Mother    Multiple myeloma Mother    Hypertension Father    Diabetes Father     Current Outpatient Medications  Medication Sig  Dispense Refill   acyclovir  (ZOVIRAX ) 400 MG tablet Take 1 tablet (400 mg total) by mouth daily. (Patient not taking: Reported on 03/10/2024) 90 tablet 2   atorvastatin  (LIPITOR) 40 MG tablet TAKE 1 TABLET BY MOUTH EVERY DAY 90 tablet 1   B-D ULTRAFINE III SHORT PEN 31G X 8 MM MISC Inject into the skin as directed.     calcium  carbonate (TUMS EX) 750 MG chewable tablet Chew 1 tablet by mouth daily.     Continuous Glucose Sensor (DEXCOM G7 SENSOR) MISC USE AS DIRECTED TO CHECK BLOOD SUGARS. CHANGE EVERY 10 DAYS 1 each 3   cyanocobalamin  (VITAMIN B12) 1000 MCG/ML injection Inject 1 mL (1,000 mcg total) into the muscle every 30 (thirty) days. 9 mL 1   dexamethasone  (DECADRON ) 4 MG tablet TAKE 2 TABLETS IN THE MORNING WITH BREAKFAST ONCE EVERY WEEK BEFORE CHEMOTHERAPY     Dulaglutide  (TRULICITY ) 1.5 MG/0.5ML SOAJ Inject 1.5 mg into  the skin every Sunday.     ELIQUIS  5 MG TABS tablet TAKE 1 TABLET BY MOUTH TWICE A DAY 60 tablet 2   enalapril  (VASOTEC ) 10 MG tablet TAKE 1 TABLET BY MOUTH EVERY DAY 90 tablet 0   folic acid  (FOLVITE ) 1 MG tablet TAKE 1 TABLET BY MOUTH EVERY DAY 90 tablet 1   glucose blood test strip Use as instructed to test blood sugar 3 times a day. Dx code: e11.65 100 each 2   insulin  glargine (LANTUS ) 100 UNIT/ML injection Inject 0.05 mLs (5 Units total) into the skin at bedtime.     lenalidomide  (REVLIMID ) 5 MG capsule Take 1 capsule daily for 21 days, then off 7 days for cycle of every 28 days 21 capsule 0   omeprazole  (PRILOSEC) 40 MG capsule TAKE 1 CAPSULE (40 MG TOTAL) BY MOUTH DAILY. 90 capsule 1   ondansetron  (ZOFRAN ) 8 MG tablet Take 8 mg by mouth every 8 (eight) hours as needed for nausea or vomiting.     ONETOUCH VERIO test strip USE AS INSTRUCTED TO CHECK BLOOD SUGARS TWICE DAILY E11.69 200 strip 1   oxyCODONE  10 MG TABS Take 1 tablet (10 mg total) by mouth every 4 (four) hours as needed for severe pain (pain score 7-10). 60 tablet 0   prochlorperazine  (COMPAZINE ) 10 MG  tablet Take 10 mg by mouth every 6 (six) hours as needed for nausea or vomiting.     sildenafil  (VIAGRA ) 100 MG tablet Take 1 tablet (100 mg total) by mouth as needed for erectile dysfunction. TAKE 1 TABLET BY MOUTH EVERY DAY prn 10 tablet 2   SYRINGE-NEEDLE, DISP, 3 ML (B-D INTEGRA SYRINGE) 25G X 5/8 3 ML MISC USE AS DIRECTED 12 each 24   No current facility-administered medications for this visit.    No Known Allergies  REVIEW OF SYSTEMS (negative unless checked):   Cardiac:  []  Chest pain or chest pressure? []  Shortness of breath upon activity? []  Shortness of breath when lying flat? []  Irregular heart rhythm?  Vascular:  []  Pain in calf, thigh, or hip brought on by walking? []  Pain in feet at night that wakes you up from your sleep? []  Blood clot in your veins? [x]  Leg swelling?  Pulmonary:  []  Oxygen at home? []  Productive cough? []  Wheezing?  Neurologic:  []  Sudden weakness in arms or legs? []  Sudden numbness in arms or legs? []  Sudden onset of difficult speaking or slurred speech? []  Temporary loss of vision in one eye? []  Problems with dizziness?  Gastrointestinal:  []  Blood in stool? []  Vomited blood?  Genitourinary:  []  Burning when urinating? []  Blood in urine?  Psychiatric:  []  Major depression  Hematologic:  []  Bleeding problems? []  Problems with blood clotting?  Dermatologic:  []  Rashes or ulcers?  Constitutional:  []  Fever or chills?  Ear/Nose/Throat:  []  Change in hearing? []  Nose bleeds? []  Sore throat?  Musculoskeletal:  []  Back pain? []  Joint pain? []  Muscle pain?   Physical Examination     Vitals:   04/06/24 0951  BP: (!) 160/85  Pulse: 67  Temp: 97.9 F (36.6 C)  TempSrc: Temporal  Weight: 236 lb 1.6 oz (107.1 kg)   Body mass index is 29.51 kg/m.  General:  WDWN in NAD; vital signs documented above Gait: Not observed HENT: WNL, normocephalic Pulmonary: normal non-labored breathing , without Rales, rhonchi,   wheezing Cardiac: Regular Abdomen: soft, NT, no masses Skin: without rashes Vascular Exam/Pulses: BLE warm and well-perfused Extremities: No  significant edema of right lower extremity.  1+ edema around left ankle Musculoskeletal: no muscle wasting or atrophy  Neurologic: A&O X 3;  No focal weakness or paresthesias are detected Psychiatric:  The pt has Normal affect.  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  No acute DVT  Left IVC/iliac vein duplex (04/06/2024) No evidence of DVT.  Patent left common iliac vein   Medical Decision Making   Olga Seyler is a 68 y.o. male who presents for follow-up  The patient has a history of extensive left iliac and left lower extremity DVT requiring thrombectomy and iliac vein stenting Duplex demonstrates a patent left common iliac vein without stenosis.  There is no evidence of IVC or iliac vein thrombus Left lower extremity duplex is negative for acute DVT.  There is chronic DVT in the left common femoral vein, femoral vein, and popliteal vein The patient continues to do well after thrombectomy earlier this year.  He says that he has intermittent left lower extremity swelling, usually after being on his feet all day.  He says that he elevates his legs as needed for swelling. On exam he has no edema of the right lower extremity.  He has 1+ edema around the left ankle.  He has no venous ulcerations The patient will continue his Eliquis  lifelong, as recommended by oncology.  He can follow-up with our office in 1 year with repeat left iliac vein duplex   Ahmed SHAUNNA Holster, PA-C [facility] Office: [phone]  04/06/2024, 9:56 AM  Clinic MD: [place]

## 2024-04-07 DIAGNOSIS — Z85110 Personal history of malignant carcinoid tumor of bronchus and lung: Secondary | ICD-10-CM | POA: Diagnosis not present

## 2024-04-07 DIAGNOSIS — C9000 Multiple myeloma not having achieved remission: Secondary | ICD-10-CM | POA: Diagnosis not present

## 2024-04-09 LAB — MULTIPLE MYELOMA PANEL, SERUM
Albumin SerPl Elph-Mcnc: 3.4 g/dL (ref 2.9–4.4)
Albumin/Glob SerPl: 1.1 (ref 0.7–1.7)
Alpha 1: 0.3 g/dL (ref 0.0–0.4)
Alpha2 Glob SerPl Elph-Mcnc: 0.9 g/dL (ref 0.4–1.0)
B-Globulin SerPl Elph-Mcnc: 0.8 g/dL (ref 0.7–1.3)
Gamma Glob SerPl Elph-Mcnc: 1.1 g/dL (ref 0.4–1.8)
Globulin, Total: 3.1 g/dL (ref 2.2–3.9)
IgA: 26 mg/dL — ABNORMAL LOW (ref 61–437)
IgG (Immunoglobin G), Serum: 1179 mg/dL (ref 603–1613)
IgM (Immunoglobulin M), Srm: 26 mg/dL (ref 20–172)
M Protein SerPl Elph-Mcnc: 0.9 g/dL — ABNORMAL HIGH
Total Protein ELP: 6.5 g/dL (ref 6.0–8.5)

## 2024-04-11 ENCOUNTER — Other Ambulatory Visit: Payer: Self-pay | Admitting: Hematology and Oncology

## 2024-04-12 ENCOUNTER — Encounter: Payer: Self-pay | Admitting: Hematology and Oncology

## 2024-04-12 ENCOUNTER — Telehealth: Payer: Self-pay

## 2024-04-12 NOTE — Telephone Encounter (Signed)
-----   Message from Almarie Bedford sent at 04/12/2024  2:54 PM EDT ----- Pls call him Is he still taking tums and omeprazole ? Tell him to hold dexamethasone  It is likely due to reflux, not sure what he ate Mylanta might help too

## 2024-04-12 NOTE — Telephone Encounter (Signed)
 Called and given below message. He verbalized understanding. He is still taking Tums and omeprazole . Sometimes he does not take the Tums but will start taking daily. He is going to hold the dexamethasone  and will stat Mylanta.  FYI

## 2024-04-14 ENCOUNTER — Encounter: Payer: Self-pay | Admitting: Hematology and Oncology

## 2024-04-14 ENCOUNTER — Other Ambulatory Visit: Payer: Self-pay | Admitting: Hematology and Oncology

## 2024-04-14 DIAGNOSIS — C9000 Multiple myeloma not having achieved remission: Secondary | ICD-10-CM

## 2024-04-14 NOTE — Telephone Encounter (Signed)
Pls refill electronically °

## 2024-04-17 ENCOUNTER — Other Ambulatory Visit: Payer: Self-pay | Admitting: Hematology and Oncology

## 2024-04-18 ENCOUNTER — Telehealth: Payer: Self-pay

## 2024-04-18 ENCOUNTER — Encounter: Payer: Self-pay | Admitting: Hematology and Oncology

## 2024-04-18 NOTE — Telephone Encounter (Signed)
 Returned his call. He is scheduled for treatment tomorrow. He stopped dexamethasone  recently due to hiccups/ acid reflux. No hiccups in 3 days and acid reflux is better. He is asking if he should should take dexamethasone  prior to treatment tomorrow?  He is out of folic acid  and asking if if should continue taking?

## 2024-04-18 NOTE — Telephone Encounter (Signed)
Called and given below message. He verbalized understanding. 

## 2024-04-18 NOTE — Telephone Encounter (Signed)
 Reduce dexamethasone  to only 1 pill once a week, please make changes to his med list No need to continue folic acid , that was for his lung ca treatment

## 2024-04-19 ENCOUNTER — Inpatient Hospital Stay: Admitting: Hematology and Oncology

## 2024-04-19 ENCOUNTER — Inpatient Hospital Stay

## 2024-04-19 ENCOUNTER — Encounter: Payer: Self-pay | Admitting: Hematology and Oncology

## 2024-04-19 VITALS — BP 140/78 | HR 65 | Temp 97.7°F | Resp 18 | Ht 75.0 in | Wt 232.2 lb

## 2024-04-19 DIAGNOSIS — E1165 Type 2 diabetes mellitus with hyperglycemia: Secondary | ICD-10-CM | POA: Diagnosis not present

## 2024-04-19 DIAGNOSIS — C9000 Multiple myeloma not having achieved remission: Secondary | ICD-10-CM | POA: Diagnosis not present

## 2024-04-19 DIAGNOSIS — Z5111 Encounter for antineoplastic chemotherapy: Secondary | ICD-10-CM | POA: Diagnosis not present

## 2024-04-19 DIAGNOSIS — Z5112 Encounter for antineoplastic immunotherapy: Secondary | ICD-10-CM | POA: Diagnosis not present

## 2024-04-19 DIAGNOSIS — D61818 Other pancytopenia: Secondary | ICD-10-CM | POA: Diagnosis not present

## 2024-04-19 DIAGNOSIS — E1122 Type 2 diabetes mellitus with diabetic chronic kidney disease: Secondary | ICD-10-CM

## 2024-04-19 DIAGNOSIS — Z23 Encounter for immunization: Secondary | ICD-10-CM | POA: Diagnosis not present

## 2024-04-19 DIAGNOSIS — Z794 Long term (current) use of insulin: Secondary | ICD-10-CM

## 2024-04-19 DIAGNOSIS — D539 Nutritional anemia, unspecified: Secondary | ICD-10-CM | POA: Diagnosis not present

## 2024-04-19 DIAGNOSIS — R5381 Other malaise: Secondary | ICD-10-CM | POA: Diagnosis not present

## 2024-04-19 DIAGNOSIS — N183 Chronic kidney disease, stage 3 unspecified: Secondary | ICD-10-CM

## 2024-04-19 DIAGNOSIS — Z86711 Personal history of pulmonary embolism: Secondary | ICD-10-CM | POA: Diagnosis not present

## 2024-04-19 DIAGNOSIS — Z7901 Long term (current) use of anticoagulants: Secondary | ICD-10-CM | POA: Diagnosis not present

## 2024-04-19 DIAGNOSIS — Z86718 Personal history of other venous thrombosis and embolism: Secondary | ICD-10-CM | POA: Diagnosis not present

## 2024-04-19 LAB — CMP (CANCER CENTER ONLY)
ALT: 13 U/L (ref 0–44)
AST: 19 U/L (ref 15–41)
Albumin: 4.2 g/dL (ref 3.5–5.0)
Alkaline Phosphatase: 85 U/L (ref 38–126)
Anion gap: 6 (ref 5–15)
BUN: 13 mg/dL (ref 8–23)
CO2: 28 mmol/L (ref 22–32)
Calcium: 9.5 mg/dL (ref 8.9–10.3)
Chloride: 105 mmol/L (ref 98–111)
Creatinine: 1.39 mg/dL — ABNORMAL HIGH (ref 0.61–1.24)
GFR, Estimated: 55 mL/min — ABNORMAL LOW (ref >60)
Glucose, Bld: 100 mg/dL — ABNORMAL HIGH (ref 70–99)
Potassium: 4 mmol/L (ref 3.5–5.1)
Sodium: 139 mmol/L (ref 135–145)
Total Bilirubin: 0.7 mg/dL (ref 0.0–1.2)
Total Protein: 6.9 g/dL (ref 6.5–8.1)

## 2024-04-19 LAB — CBC WITH DIFFERENTIAL (CANCER CENTER ONLY)
Abs Immature Granulocytes: 0.02 K/uL (ref 0.00–0.07)
Basophils Absolute: 0 K/uL (ref 0.0–0.1)
Basophils Relative: 1 %
Eosinophils Absolute: 0.1 K/uL (ref 0.0–0.5)
Eosinophils Relative: 3 %
HCT: 31.2 % — ABNORMAL LOW (ref 39.0–52.0)
Hemoglobin: 10.3 g/dL — ABNORMAL LOW (ref 13.0–17.0)
Immature Granulocytes: 1 %
Lymphocytes Relative: 25 %
Lymphs Abs: 0.9 K/uL (ref 0.7–4.0)
MCH: 29.7 pg (ref 26.0–34.0)
MCHC: 33 g/dL (ref 30.0–36.0)
MCV: 89.9 fL (ref 80.0–100.0)
Monocytes Absolute: 0.5 K/uL (ref 0.1–1.0)
Monocytes Relative: 13 %
Neutro Abs: 2.1 K/uL (ref 1.7–7.7)
Neutrophils Relative %: 57 %
Platelet Count: 242 K/uL (ref 150–400)
RBC: 3.47 MIL/uL — ABNORMAL LOW (ref 4.22–5.81)
RDW: 12.5 % (ref 11.5–15.5)
WBC Count: 3.7 K/uL — ABNORMAL LOW (ref 4.0–10.5)
nRBC: 0 % (ref 0.0–0.2)

## 2024-04-19 MED ORDER — BORTEZOMIB CHEMO SQ INJECTION 3.5 MG (2.5MG/ML)
1.3000 mg/m2 | Freq: Once | INTRAMUSCULAR | Status: AC
  Administered 2024-04-19: 3 mg via SUBCUTANEOUS
  Filled 2024-04-19: qty 1.2

## 2024-04-19 MED ORDER — INSULIN GLARGINE 100 UNIT/ML ~~LOC~~ SOLN: 3.0000 [IU] | Freq: Every day | SUBCUTANEOUS | Status: DC

## 2024-04-19 MED ORDER — PROCHLORPERAZINE MALEATE 10 MG PO TABS
10.0000 mg | ORAL_TABLET | Freq: Once | ORAL | Status: AC
  Administered 2024-04-19: 10 mg via ORAL
  Filled 2024-04-19: qty 1

## 2024-04-19 NOTE — Progress Notes (Signed)
 [facility] OFFICE PROGRESS NOTE  Patient Care Team: Jarold Medici, MD as PCP - General (Internal Medicine) [name], RN as Oncology Nurse Navigator  Assessment & Plan Multiple myeloma not having achieved remission [facility]) The patient have prior diagnosis of smoldering myeloma.  He was referred to Dr. 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 PET/CT imaging was reviewed with the patient and his wife which show no evidence of significant bone involvement/fractures  So far, he tolerated treatment well except for intermittent reflux and high blood sugar on the days that he takes treatment We discussed the role of bone marrow transplant and for now, he is not keen for transplant evaluation Repeat myeloma panel showed positive response to therapy We discussed adding lenalidomide  for deeper response, started in September  I reviewed recent myeloma panel which show positive disease control For now, he will continue lenalidomide  21 days on, 7 days off He will get daratumumab  every 28 days He will get Velcade  every other week He will take weekly dexamethasone ; recent dose is tapered due to reflux He will continue taking Eliquis  for DVT prophylaxis He will continue taking calcium , vitamin D  supplement and Zometa  every 3 months, next due in November Type 2 diabetes mellitus with stage 3 chronic kidney disease, with long-term current use of insulin , unspecified whether stage 3a or 3b CKD (HCC) This is improving with aggressive dietary modification I recommend reducing the dose of insulin  to 3 units/day I am hopeful we can get him off insulin  by the end of the year Pancytopenia, acquired  (HCC) The cause of his pancytopenia is multifactorial, likely due to side effects of chemotherapy There is also component of anemia of chronic kidney disease as well as B12 deficiency Will continue close monitoring Will proceed with treatment without delay Physical debility He has signs of generalized deconditioning and slight signs of sarcopenia I recommend graduated exercise as tolerated and high-protein intake  No orders of the defined types were placed in this encounter.    Almarie Bedford, MD  INTERVAL HISTORY: he returns for treatment follow-up Complications related to previous cycle of chemotherapy included pancytopenia, and generalized weakness He has not been eating well He has not been exercising much since his recent back pain Denies recent infection I reviewed his medication list and discussed medicine changes  PHYSICAL EXAMINATION: ECOG PERFORMANCE STATUS: 1 - Symptomatic but completely ambulatory  No results found for: CAN125    Latest Ref Rng & Units 04/19/2024    9:41 AM 04/05/2024    9:13 AM 03/22/2024    9:09 AM  CBC  WBC 4.0 - 10.5 K/uL 3.7  4.7  6.3   Hemoglobin 13.0 - 17.0 g/dL 89.6  89.8  89.3   Hematocrit 39.0 - 52.0 % 31.2  30.6  31.8   Platelets 150 - 400 K/uL 242  162  232       Chemistry      Component Value Date/Time   NA 139 04/19/2024 0941   NA 137 11/05/2022 1152   K 4.0 04/19/2024 0941   CL 105 04/19/2024 0941   CO2 28 04/19/2024 0941   BUN 13 04/19/2024 0941   BUN 11 11/05/2022 1152   CREATININE 1.39 (H)  04/19/2024 0941      Component Value Date/Time   CALCIUM  9.5 04/19/2024 0941   ALKPHOS 85 04/19/2024 0941   AST 19 04/19/2024 0941   ALT 13 04/19/2024 0941   BILITOT 0.7 04/19/2024 0941       Vitals:   04/19/24 0951  BP: (!) 140/78  Pulse: 65  Resp: 18  Temp: 97.7 F (36.5 C)  SpO2: 100%   Filed Weights   04/19/24 0951  Weight: 232 lb 3.2 oz (105.3 kg)   Other relevant data reviewed during this visit included CBC,  CMP, myeloma panel

## 2024-04-19 NOTE — Progress Notes (Signed)
 Patient took his steroid at home today.

## 2024-04-19 NOTE — Assessment & Plan Note (Addendum)
 This is improving with aggressive dietary modification I recommend reducing the dose of insulin  to 3 units/day I am hopeful we can get him off insulin  by the end of the year

## 2024-04-19 NOTE — Assessment & Plan Note (Addendum)
 The patient have prior diagnosis of smoldering myeloma.  He was referred to Dr. 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 PET/CT imaging was reviewed with the patient and his wife which show no evidence of significant bone involvement/fractures  So far, he tolerated treatment well except for intermittent reflux and high blood sugar on the days that he takes treatment We discussed the role of bone marrow transplant and for now, he is not keen for transplant evaluation Repeat myeloma panel showed positive response to therapy We discussed adding lenalidomide  for deeper response, started in September  I reviewed recent myeloma panel which show positive disease control For now, he will continue lenalidomide  21 days on, 7 days off He will get daratumumab  every 28 days He will get Velcade  every other week He will take weekly dexamethasone ; recent dose is tapered due to reflux He will continue taking Eliquis  for DVT prophylaxis He will continue taking calcium , vitamin D  supplement and Zometa  every 3 months, next due in November

## 2024-04-19 NOTE — Assessment & Plan Note (Addendum)
 The cause of his pancytopenia is multifactorial, likely due to side effects of chemotherapy There is also component of anemia of chronic kidney disease as well as B12 deficiency Will continue close monitoring Will proceed with treatment without delay

## 2024-04-19 NOTE — Assessment & Plan Note (Addendum)
 He has signs of generalized deconditioning and slight signs of sarcopenia I recommend graduated exercise as tolerated and high-protein intake

## 2024-04-19 NOTE — Patient Instructions (Signed)
 [facility] - A DEPT OF [facility]  Discharge Instructions: Thank you for choosing [facility] to provide your oncology and hematology care.   If you have a lab appointment with the Cancer Center, please go directly to the Cancer Center and check in at the registration area.   Wear comfortable clothing and clothing appropriate for easy access to any Portacath or PICC line.   We strive to give you quality time with your provider. You may need to reschedule your appointment if you arrive late (15 or more minutes).  Arriving late affects you and other patients whose appointments are after yours.  Also, if you miss three or more appointments without notifying the office, you may be dismissed from the clinic at the provider's discretion.      For prescription refill requests, have your pharmacy contact our office and allow 72 hours for refills to be completed.    Today you received the following chemotherapy and/or immunotherapy agents: Velcade     To help prevent nausea and vomiting after your treatment, we encourage you to take your nausea medication as directed.  BELOW ARE SYMPTOMS THAT SHOULD BE REPORTED IMMEDIATELY: *FEVER GREATER THAN 100.4 F (38 C) OR HIGHER *CHILLS OR SWEATING *NAUSEA AND VOMITING THAT IS NOT CONTROLLED WITH YOUR NAUSEA MEDICATION *UNUSUAL SHORTNESS OF BREATH *UNUSUAL BRUISING OR BLEEDING *URINARY PROBLEMS (pain or burning when urinating, or frequent urination) *BOWEL PROBLEMS (unusual diarrhea, constipation, pain near the anus) TENDERNESS IN MOUTH AND THROAT WITH OR WITHOUT PRESENCE OF ULCERS (sore throat, sores in mouth, or a toothache) UNUSUAL RASH, SWELLING OR PAIN  UNUSUAL VAGINAL DISCHARGE OR ITCHING   Items with * indicate a potential emergency and should be followed up as soon as possible or go to the Emergency Department if any problems should occur.  Please show the CHEMOTHERAPY ALERT CARD or IMMUNOTHERAPY  ALERT CARD at check-in to the Emergency Department and triage nurse.  Should you have questions after your visit or need to cancel or reschedule your appointment, please contact [facility] - A DEPT OF [facility]  Dept: [phone]  and follow the prompts.  Office hours are 8:00 a.m. to 4:30 p.m. Monday - Friday. Please note that voicemails left after 4:00 p.m. may not be returned until the following business day.  We are closed weekends and major holidays. You have access to a nurse at all times for urgent questions. Please call the main number to the clinic Dept: [phone] and follow the prompts.   For any non-urgent questions, you may also contact your provider using MyChart. We now offer e-Visits for anyone 88 and older to request care online for non-urgent symptoms. For details visit [email or web address].   Also download the MyChart app! Go to the app store, search MyChart, open the app, select [place], and log in with your MyChart username and password.

## 2024-04-25 ENCOUNTER — Telehealth: Payer: Self-pay

## 2024-04-25 ENCOUNTER — Emergency Department (HOSPITAL_BASED_OUTPATIENT_CLINIC_OR_DEPARTMENT_OTHER)

## 2024-04-25 ENCOUNTER — Emergency Department (HOSPITAL_BASED_OUTPATIENT_CLINIC_OR_DEPARTMENT_OTHER): Admitting: Radiology

## 2024-04-25 ENCOUNTER — Encounter (HOSPITAL_BASED_OUTPATIENT_CLINIC_OR_DEPARTMENT_OTHER): Payer: Self-pay

## 2024-04-25 ENCOUNTER — Emergency Department (HOSPITAL_BASED_OUTPATIENT_CLINIC_OR_DEPARTMENT_OTHER)
Admission: EM | Admit: 2024-04-25 | Discharge: 2024-04-25 | Disposition: A | Discharge status: Home / Self Care | Attending: Emergency Medicine | Admitting: Emergency Medicine

## 2024-04-25 ENCOUNTER — Other Ambulatory Visit: Payer: Self-pay

## 2024-04-25 DIAGNOSIS — J90 Pleural effusion, not elsewhere classified: Secondary | ICD-10-CM | POA: Diagnosis not present

## 2024-04-25 DIAGNOSIS — W1839XA Other fall on same level, initial encounter: Secondary | ICD-10-CM | POA: Diagnosis not present

## 2024-04-25 DIAGNOSIS — M1711 Unilateral primary osteoarthritis, right knee: Secondary | ICD-10-CM | POA: Diagnosis not present

## 2024-04-25 DIAGNOSIS — M799 Soft tissue disorder, unspecified: Secondary | ICD-10-CM | POA: Diagnosis not present

## 2024-04-25 DIAGNOSIS — S80811A Abrasion, right lower leg, initial encounter: Secondary | ICD-10-CM | POA: Diagnosis not present

## 2024-04-25 DIAGNOSIS — I82411 Acute embolism and thrombosis of right femoral vein: Secondary | ICD-10-CM | POA: Diagnosis not present

## 2024-04-25 DIAGNOSIS — R531 Weakness: Secondary | ICD-10-CM | POA: Diagnosis not present

## 2024-04-25 DIAGNOSIS — I82412 Acute embolism and thrombosis of left femoral vein: Secondary | ICD-10-CM | POA: Diagnosis not present

## 2024-04-25 DIAGNOSIS — I82432 Acute embolism and thrombosis of left popliteal vein: Secondary | ICD-10-CM | POA: Diagnosis not present

## 2024-04-25 DIAGNOSIS — I825Y2 Chronic embolism and thrombosis of unspecified deep veins of left proximal lower extremity: Secondary | ICD-10-CM | POA: Insufficient documentation

## 2024-04-25 DIAGNOSIS — Z7901 Long term (current) use of anticoagulants: Secondary | ICD-10-CM | POA: Insufficient documentation

## 2024-04-25 DIAGNOSIS — J9811 Atelectasis: Secondary | ICD-10-CM | POA: Diagnosis not present

## 2024-04-25 DIAGNOSIS — M1712 Unilateral primary osteoarthritis, left knee: Secondary | ICD-10-CM | POA: Insufficient documentation

## 2024-04-25 DIAGNOSIS — M1611 Unilateral primary osteoarthritis, right hip: Secondary | ICD-10-CM | POA: Diagnosis not present

## 2024-04-25 DIAGNOSIS — M25561 Pain in right knee: Secondary | ICD-10-CM | POA: Diagnosis not present

## 2024-04-25 DIAGNOSIS — M13862 Other specified arthritis, left knee: Secondary | ICD-10-CM | POA: Diagnosis not present

## 2024-04-25 DIAGNOSIS — M13861 Other specified arthritis, right knee: Secondary | ICD-10-CM | POA: Diagnosis not present

## 2024-04-25 DIAGNOSIS — M79652 Pain in left thigh: Secondary | ICD-10-CM | POA: Diagnosis not present

## 2024-04-25 DIAGNOSIS — S8991XA Unspecified injury of right lower leg, initial encounter: Secondary | ICD-10-CM | POA: Diagnosis not present

## 2024-04-25 LAB — COMPREHENSIVE METABOLIC PANEL WITH GFR
ALT: 15 U/L (ref 0–44)
AST: 27 U/L (ref 15–41)
Albumin: 4.3 g/dL (ref 3.5–5.0)
Alkaline Phosphatase: 99 U/L (ref 38–126)
Anion gap: 9 (ref 5–15)
BUN: 16 mg/dL (ref 8–23)
CO2: 25 mmol/L (ref 22–32)
Calcium: 10.2 mg/dL (ref 8.9–10.3)
Chloride: 104 mmol/L (ref 98–111)
Creatinine, Ser: 1.43 mg/dL — ABNORMAL HIGH (ref 0.61–1.24)
GFR, Estimated: 53 mL/min — ABNORMAL LOW (ref >60)
Glucose, Bld: 94 mg/dL (ref 70–99)
Potassium: 4.3 mmol/L (ref 3.5–5.1)
Sodium: 138 mmol/L (ref 135–145)
Total Bilirubin: 0.5 mg/dL (ref 0.0–1.2)
Total Protein: 6.9 g/dL (ref 6.5–8.1)

## 2024-04-25 LAB — CBC WITH DIFFERENTIAL/PLATELET
Abs Immature Granulocytes: 0.02 K/uL (ref 0.00–0.07)
Basophils Absolute: 0 K/uL (ref 0.0–0.1)
Basophils Relative: 1 %
Eosinophils Absolute: 0.1 K/uL (ref 0.0–0.5)
Eosinophils Relative: 2 %
HCT: 30.6 % — ABNORMAL LOW (ref 39.0–52.0)
Hemoglobin: 10.1 g/dL — ABNORMAL LOW (ref 13.0–17.0)
Immature Granulocytes: 0 %
Lymphocytes Relative: 24 %
Lymphs Abs: 1.2 K/uL (ref 0.7–4.0)
MCH: 30.2 pg (ref 26.0–34.0)
MCHC: 33 g/dL (ref 30.0–36.0)
MCV: 91.6 fL (ref 80.0–100.0)
Monocytes Absolute: 0.8 K/uL (ref 0.1–1.0)
Monocytes Relative: 15 %
Neutro Abs: 3 K/uL (ref 1.7–7.7)
Neutrophils Relative %: 58 %
Platelets: 220 K/uL (ref 150–400)
RBC: 3.34 MIL/uL — ABNORMAL LOW (ref 4.22–5.81)
RDW: 12.6 % (ref 11.5–15.5)
WBC: 5.1 K/uL (ref 4.0–10.5)
nRBC: 0 % (ref 0.0–0.2)

## 2024-04-25 MED ORDER — IOHEXOL 350 MG/ML SOLN
80.0000 mL | Freq: Once | INTRAVENOUS | Status: AC | PRN
  Administered 2024-04-25: 80 mL via INTRAVENOUS

## 2024-04-25 NOTE — ED Provider Notes (Signed)
 [place] EMERGENCY DEPARTMENT AT [facility] Provider Note   CSN: [id number] Arrival date & time: 04/25/24  1309     Patient presents with: Knee Pain (Bilateral )   William Mcguire is a 68 y.o. male.   Pt complains of pain in both legs.  Pt has a history of multiple myeloma.  Patient has a history of a DVT in his left leg that required a thrombectomy in January of this year.  Patient complains of severe pain in his left leg.  Patient states today he had difficulty walking.  Patient states he fell landing on his right leg.  Patient states he has had pain in both legs but the left leg is still worse than the right leg that he fell on.  Patient complains of an abrasion to his upper right leg.  Patient states that he has been walking okay until today.  Patient's wife is concerned that patient could have a DVT in his leg.  She reports that one of the side effects of the chemotherapy he is receiving is DVTs. Pt was started on lenalidomide  by oncology   The history is provided by the patient. No language interpreter was used.  Knee Pain      Prior to Admission medications   Medication Sig Start Date End Date Taking? Authorizing Provider  acyclovir  (ZOVIRAX ) 400 MG tablet Take 1 tablet (400 mg total) by mouth daily. Patient not taking: Reported on 03/10/2024 03/08/24   Lonn Hicks, MD  atorvastatin  (LIPITOR) 40 MG tablet TAKE 1 TABLET BY MOUTH EVERY DAY 02/23/24   Jarold Medici, MD  B-D ULTRAFINE III SHORT PEN 31G X 8 MM MISC Inject into the skin as directed. 06/30/23   [provider]  calcium  carbonate (TUMS EX) 750 MG chewable tablet Chew 1 tablet by mouth daily.    [provider]  Continuous Glucose Sensor (DEXCOM G7 SENSOR) MISC USE AS DIRECTED TO CHECK BLOOD SUGARS. CHANGE EVERY 10 DAYS 12/31/23   Jarold Medici, MD  cyanocobalamin  (VITAMIN B12) 1000 MCG/ML injection Inject 1 mL (1,000 mcg total) into the muscle every 30 (thirty) days. 12/28/23   Jarold Medici, MD   dexamethasone  (DECADRON ) 4 MG tablet TAKE 2 TABLETS IN THE MORNING WITH BREAKFAST ONCE EVERY WEEK BEFORE CHEMOTHERAPY Patient taking differently: TAKE 1 TABLET IN THE MORNING WITH BREAKFAST ONCE EVERY WEEK BEFORE CHEMOTHERAPY 04/05/24   Lonn Hicks, MD  Dulaglutide  (TRULICITY ) 1.5 MG/0.5ML SOAJ Inject 1.5 mg into the skin every Sunday.    [provider]  ELIQUIS  5 MG TABS tablet TAKE 1 TABLET BY MOUTH TWICE A DAY 03/21/24   Lonn Hicks, MD  enalapril  (VASOTEC ) 10 MG tablet TAKE 1 TABLET BY MOUTH EVERY DAY 04/11/24   Lonn, Ni, MD  glucose blood test strip Use as instructed to test blood sugar 3 times a day. Dx code: e11.65 11/26/21   Jarold Medici, MD  insulin  glargine (LANTUS ) 100 UNIT/ML injection Inject 0.03 mLs (3 Units total) into the skin at bedtime. 04/19/24   Lonn Hicks, MD  lenalidomide  (REVLIMID ) 5 MG capsule TAKE 1 CAPSULE BY MOUTH 1 TIME A DAY FOR 21 DAYS ON THEN 7 DAYS OFF 04/14/24   Lonn Hicks, MD  omeprazole  (PRILOSEC) 40 MG capsule TAKE 1 CAPSULE (40 MG TOTAL) BY MOUTH DAILY. 12/23/23   Lonn Hicks, MD  ondansetron  (ZOFRAN ) 8 MG tablet Take 8 mg by mouth every 8 (eight) hours as needed for nausea or vomiting.    [provider]  AISHA SINKS test  strip USE AS INSTRUCTED TO CHECK BLOOD SUGARS TWICE DAILY E11.69 04/05/24   Jarold Medici, MD  oxyCODONE  10 MG TABS Take 1 tablet (10 mg total) by mouth every 4 (four) hours as needed for severe pain (pain score 7-10). 02/29/24   Lonn Hicks, MD  prochlorperazine  (COMPAZINE ) 10 MG tablet Take 10 mg by mouth every 6 (six) hours as needed for nausea or vomiting.    [provider]  sildenafil  (VIAGRA ) 100 MG tablet Take 1 tablet (100 mg total) by mouth as needed for erectile dysfunction. TAKE 1 TABLET BY MOUTH EVERY DAY prn 11/13/23   Jarold Medici, MD  SYRINGE-NEEDLE, DISP, 3 ML (B-D INTEGRA SYRINGE) 25G X 5/8 3 ML MISC USE AS DIRECTED 06/23/23   Jarold Medici, MD    Allergies: Patient has no known allergies.     Review of Systems  All other systems reviewed and are negative.   Updated Vital Signs BP 129/63   Pulse 68   Temp 97.8 F (36.6 C) (Oral)   Resp 16   SpO2 100%   Physical Exam Vitals and nursing note reviewed.  Constitutional:      Appearance: He is well-developed.  HENT:     Head: Normocephalic.  Cardiovascular:     Rate and Rhythm: Normal rate.  Pulmonary:     Effort: Pulmonary effort is normal.  Abdominal:     General: There is no distension.  Musculoskeletal:        General: Tenderness present.     Cervical back: Normal range of motion.     Comments: Pulses present bilat  dp and pt with doppler. Abrasion left posterior thigh.   Skin:    General: Skin is warm.  Neurological:     General: No focal deficit present.     Mental Status: He is alert and oriented to person, place, and time.  Psychiatric:        Mood and Affect: Mood normal.     (all labs ordered are listed, but only abnormal results are displayed) Labs Reviewed  CBC WITH DIFFERENTIAL/PLATELET - Abnormal; Notable for the following components:      Result Value   RBC 3.34 (*)    Hemoglobin 10.1 (*)    HCT 30.6 (*)    All other components within normal limits  COMPREHENSIVE METABOLIC PANEL WITH GFR - Abnormal; Notable for the following components:   Creatinine, Ser 1.43 (*)    GFR, Estimated 53 (*)    All other components within normal limits    EKG: None  Radiology: DG Knee Complete 4 Views Right Result Date: 04/25/2024 CLINICAL DATA:  pain EXAM: DG KNEE COMPLETE 4+V*R* COMPARISON:  None Available. FINDINGS: No acute fracture or dislocation. No joint effusion. Moderate to severe patellofemoral joint space loss with moderate medial compartment joint space loss. Mild joint space loss noted in the lateral compartment. Tricompartmental osteophyte formation. Soft tissues are unremarkable. IMPRESSION: 1. No acute fracture or dislocation. 2. Tricompartmental osteoarthritis of the knee, worst in the  patellofemoral compartment. Electronically Signed   By: Rogelia Myers M.D.   On: 04/25/2024 18:30   US  Venous Img Lower Bilateral Result Date: 04/25/2024 CLINICAL DATA:  pain EXAM: BILATERAL LOWER EXTREMITY VENOUS DOPPLER ULTRASOUND TECHNIQUE: Gray-scale sonography with graded compression, as well as color Doppler and duplex ultrasound were performed to evaluate the lower extremity deep venous systems from the level of the common femoral vein and including the common femoral, femoral, profunda femoral, popliteal and calf veins including the posterior tibial,  peroneal and gastrocnemius veins when visible. The superficial great saphenous vein was also interrogated. Spectral Doppler was utilized to evaluate flow at rest and with distal augmentation maneuvers in the common femoral, femoral and popliteal veins. COMPARISON:  None Available. FINDINGS: RIGHT LOWER EXTREMITY Common Femoral Vein: No evidence of thrombus. Normal compressibility, respiratory phasicity and response to augmentation. Saphenofemoral Junction: No evidence of thrombus. Normal compressibility and flow on color Doppler imaging. Profunda Femoral Vein: No evidence of thrombus. Normal compressibility and flow on color Doppler imaging. Femoral Vein: No evidence of thrombus. Normal compressibility, respiratory phasicity and response to augmentation. Popliteal Vein: No evidence of thrombus. Normal compressibility, respiratory phasicity and response to augmentation. Calf Veins: No evidence of thrombus. Normal compressibility and flow on color Doppler imaging. Superficial Great Saphenous Vein: No evidence of thrombus. Normal compressibility. Other Findings:  None. LEFT LOWER EXTREMITY Common Femoral Vein: Partially compressible. Vascular Doppler flow is present. Saphenofemoral Junction: Partially compressible. Profunda Femoral Vein: Partially compressible. Vascular Doppler flow is present. Femoral Vein: Noncompressible. No significant vascular Doppler  flow noted in the distal portion. Popliteal Vein: Noncompressible. No significant vascular Doppler flow noted. Calf Veins: No evidence of thrombus. Normal compressibility and flow on color Doppler imaging. Superficial Great Saphenous Vein: No evidence of thrombus. Normal compressibility. Other Findings:  None. IMPRESSION: 1. Extensive deep venous thrombosis in the left leg extending from the popliteal vein upstream to the common femoral vein. While the majority of the DVT is nonocclusive, there are occlusive portions in the distal femoral and popliteal veins. 2. Negative for deep venous thrombosis in the right leg. Electronically Signed   By: Rogelia Myers M.D.   On: 04/25/2024 18:27   DG Femur Min 2 Views Left Result Date: 04/25/2024 EXAM: 2 VIEW(S) XRAY OF THE LEFT FEMUR 04/25/2024 04:37:00 PM COMPARISON: 10/15/2018 CLINICAL HISTORY: pain pain FINDINGS: BONES AND JOINTS: No acute fracture. No focal osseous lesion. No joint dislocation. Moderate to advanced tricompartmental degenerative changes in the left knee, progressed since prior study. SOFT TISSUES: The soft tissues are unremarkable. IMPRESSION: 1. Moderate to advanced tricompartmental degenerative changes in the left knee, progressed since prior study. 2. No acute findings. Electronically signed by: Franky Crease MD 04/25/2024 05:18 PM EST RP Workstation: [id number]   DG Femur Min 2 Views Right Result Date: 04/25/2024 CLINICAL DATA:  Bilateral knee pain EXAM: RIGHT FEMUR 2 VIEWS COMPARISON:  None Available. FINDINGS: Frontal and lateral views of the right femur are obtained. There is mild right hip osteoarthritis. Moderate 3 compartmental right knee osteoarthritis is noted. Large enthesophyte off the inferior pole of the patella is noted, with superimposed transverse lucency suggesting possible fracture. There is soft tissue prominence in the region of the patellar tendon as well. Dedicated imaging of the right knee may be useful. The right femur is  unremarkable with no fractures. Remaining soft tissues are unremarkable. IMPRESSION: 1. Possible nondisplaced fracture through a large enthesophyte off the lower pole of the patella, with evidence of associated patellar tendon thickening. Dedicated imaging of the right knee is recommended. 2. Osteoarthritis of the right hip and knee. 3. Unremarkable femur. Electronically Signed   By: Ozell Daring M.D.   On: 04/25/2024 17:17     Procedures   Medications Ordered in the ED - No data to display                                  Medical Decision Making Patient complains of  weakness and his legs left worse than right.  Patient reports he is having pain in his legs.  Patient reports he fell earlier today due to the weakness.  Patient did not strike his head he did not lose consciousness.  Amount and/or Complexity of Data Reviewed Independent Historian: spouse    Details: Patient's wife reports that patient has recently started on a new chemotherapy drug.  She is concerned that patient could have a blood clot in his leg and lungs because one of the side effects of the medication is blood clots.  Patient has had a previous thrombectomy and has had a pulmonary embolus Labs: ordered. Decision-making details documented in ED Course.    Details: Labs ordered reviewed and interpreted.  CBC and CMET hemoglobin is 10.1 Radiology: ordered and independent interpretation performed. Decision-making details documented in ED Course.    Details: CT angio chest shows no evidence of PE X-ray right femur shows possible osteophyte fracture.  Radiologist advised x-ray of right knee.  X-ray right knee shows advanced tricompartmental degenerative changes X-ray left femur shows tricompartmental degenerative changes of the knee. Ultrasound bilateral lower extremities right no evidence of DVT left extensive nonocclusive DVT left leg Discussion of management or test interpretation with external provider(s): I discussed the  patient with Dr. Gretta vascular surgeon.  He advised that patient can follow-up in the DVT clinic he does not feel like he needs emergent thrombectomy or treatment with heparin ..   Risk Prescription drug management. Risk Details: Patient is able to stand he is able to ambulate with a walker.  Patient complains of weakness in his left leg when he walks.        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  Patient wants to go home.  Patient is provided with a walker to use at home.  He is advised to call his oncologist tomorrow to discuss if his weakness could be coming from any of his medications.  Patient is advised to follow-up with primary care for recheck.  Patient is discharged in stable condition    Loriana Samad K, PA-C 04/25/24 2219    Doretha Folks, MD 04/26/24 1504

## 2024-04-25 NOTE — Telephone Encounter (Signed)
 Returned call to wife and [name]. William Mcguire started complaining of leg stiffness today. Now he is unable to walk without holding on to something, his legs will suddenly just give out.  He has had x 1 fall today. He denies pain and denies these symptoms in the past. Denies injury.  Instructed to go to the ER now to be evaluated. Wife and Renard verbalized understanding.  FYI

## 2024-04-25 NOTE — Discharge Instructions (Addendum)
 Follow up at the DVT clinic for evaluation. Continue home medications.  Call your oncologist to discuss if new medication could be causing your weakness.

## 2024-04-25 NOTE — ED Triage Notes (Signed)
 Patient reports bilateral knee pain. He said today when he has been walking he had them buckle out from under him. Reports the pain as 2/10. Says they are sore but his pain concern is them buckling.

## 2024-04-25 NOTE — Progress Notes (Signed)
 I was asked to review his DVT study showing mostly nonocclusive thrombus in the left leg.  Underwent previous left lower extremity percutaneous mechanical thrombectomy for DVT on 06/26/2023 with Dr. Magda.  Duplex earlier this year in June 2025 shows residual chronic partially occlusive thrombus throughout the left lower extremity which I suspect is what they are seeing on duplex at [facility] today.  Not having significant lower extremity edema much beyond his baseline.  Have recommended follow-up in the DVT clinic and continuing his Eliquis  for now.    Lonni DOROTHA Gaskins, MD [facility] Office: [phone]   Lonni JINNY Gaskins

## 2024-04-26 ENCOUNTER — Ambulatory Visit: Attending: Vascular Surgery | Admitting: Student-PharmD

## 2024-04-26 ENCOUNTER — Telehealth: Payer: Self-pay

## 2024-04-26 ENCOUNTER — Encounter: Payer: Self-pay | Admitting: Internal Medicine

## 2024-04-26 ENCOUNTER — Encounter: Payer: Self-pay | Admitting: Student-PharmD

## 2024-04-26 DIAGNOSIS — Z86718 Personal history of other venous thrombosis and embolism: Secondary | ICD-10-CM | POA: Diagnosis not present

## 2024-04-26 NOTE — Progress Notes (Signed)
 DVT Clinic Note  Name: William Mcguire     MRN: [id number]     DOB: 15-Nov-1955     Sex: male  PCP: Jarold Medici, MD  Today's Visit: Visit Information: Initial Visit  Referred to DVT Clinic by: Emergency Department - Sonny Showers, PA-C Referred to CPP by: Dr. Magda Reason for referral:  Chief Complaint  Patient presents with   DVT   HISTORY OF PRESENT ILLNESS: William Mcguire is a 68 y.o. male with PMH multiple myeloma, LLE DVT requiring thrombectomy 06/26/23 by Dr. Magda, who presents for follow up medication management for DVT. 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 He has been continued on Eliquis  by heme/onc given persistent risk factors of multiple myeloma and treatment with lenalidomide . Patient presented to the ED 04/25/24 with weakness and after having a fall from standing and was experiencing pain in both legs, left worse than right. His wife was concerned he could have another DVT. CT was negative for PE. Ultrasound showed thrombus in the left common femoral, femoral, and popliteal veins. The ED provider consulted vascular surgeon Dr. Gretta who reviewed the images and felt like this was likely the same residual clot seen on imaging in June, and he recommended continuing Eliquis  and following up in DVT Clinic.   Today, patient reports that he hasn't had strength in his legs recently. Reports this has gradually worsening over time and is now making it difficult to walk. Yesterday was his first fall related to this. Endorses bilateral knee pain and front of his thighs. Denies new swelling other than around his knee. Denies pain in calf, behind the knee, and along inner thigh. Denies abnormal bleeding or bruising. Denies missed doses of Eliquis . Takes it at 7-8am and before bed. Endorses wearing compression stockings and elevates his leg as needed.   Positive Thrombotic Risk Factors:  Previous VTE, Active cancer, Older Age Bleeding Risk Factors: Age >65 years, Anticoagulant therapy, Cancer  Negative Thrombotic Risk Factors: Recent surgery (within 3 months), Recent trauma (within 3 months), Recent admission to hospital with acute illness (within 3 months), Paralysis, paresis, or recent plaster cast immobilization of lower extremity, Central venous catheterization, Bed rest >72 hours within 3 months, Sedentary journey lasting >8 hours within 4 weeks, Estrogen therapy, Pregnancy, Within 6 weeks postpartum, Recent cesarean section (within 3 months), Testosterone  therapy, Erythropoiesis-stimulating agent, Recent COVID diagnosis (within 3 months), Non-malignant, chronic inflammatory condition, Known thrombophilic condition, Smoking, Obesity  Rx Insurance Coverage: Medicare Rx Affordability: Per patient, believes Eliquis  is free for the rest of the year (likely has met out of pocket max) Rx Assistance Provided: None needed at this time Preferred Pharmacy: Refills managed by heme/onc  Past Medical History:  Diagnosis Date   Diabetes mellitus    High cholesterol    Hypertension     Past Surgical History:  Procedure Laterality Date   BIOPSY OF SKIN SUBCUTANEOUS TISSUE AND/OR MUCOUS MEMBRANE  09/29/2023   Procedure: BIOPSY, SKIN, SUBCUTANEOUS TISSUE, OR MUCOUS MEMBRANE;  Surgeon: Elicia Claw, MD;  Location: [facility];  Service: Gastroenterology;;   BRONCHIAL BIOPSY  01/05/2023   Procedure: BRONCHIAL BIOPSIES;  Surgeon: Brenna Adine CROME, DO;  Location: [facility];  Service: Pulmonary;;   BRONCHIAL BRUSHINGS  01/05/2023   Procedure: BRONCHIAL BRUSHINGS;  Surgeon: Brenna Adine CROME, DO;  Location: [facility];  Service: Pulmonary;;   BRONCHIAL NEEDLE ASPIRATION BIOPSY  01/05/2023   Procedure: BRONCHIAL  NEEDLE ASPIRATION BIOPSIES;  Surgeon: Brenna Adine CROME, DO;  Location: [facility];  Service: Pulmonary;;   COLONOSCOPY N/A 09/29/2023   Procedure: COLONOSCOPY;  Surgeon:  Elicia Claw, MD;  Location: [facility];  Service: Gastroenterology;  Laterality: N/A;   ESOPHAGOGASTRODUODENOSCOPY N/A 09/29/2023   Procedure: EGD (ESOPHAGOGASTRODUODENOSCOPY);  Surgeon: Elicia Claw, MD;  Location: [facility];  Service: Gastroenterology;  Laterality: N/A;   INTERCOSTAL NERVE BLOCK Left 03/18/2023   Procedure: INTERCOSTAL NERVE BLOCK;  Surgeon: Shyrl Linnie KIDD, MD;  Location: [facility];  Service: Thoracic;  Laterality: Left;   KNEE SURGERY Left 2004   torn cartilage   LYMPH NODE BIOPSY Left 03/18/2023   Procedure: LYMPH NODE BIOPSY;  Surgeon: Shyrl Linnie KIDD, MD;  Location: [facility];  Service: Thoracic;  Laterality: Left;   PATELLAR TENDON REPAIR  06/11/2011   Procedure: PATELLA TENDON REPAIR;  Surgeon: Cordella Glendia Hutchinson;  Location: [facility];  Service: Orthopedics;  Laterality: Right;   PERIPHERAL VASCULAR INTERVENTION  06/26/2023   Procedure: PERIPHERAL VASCULAR INTERVENTION;  Surgeon: Magda Debby SAILOR, MD;  Location: [facility];  Service: Cardiovascular;;   PERIPHERAL VASCULAR THROMBECTOMY Left 06/26/2023   Procedure: PERIPHERAL VASCULAR THROMBECTOMY;  Surgeon: Magda Debby SAILOR, MD;  Location: [facility];  Service: Cardiovascular;  Laterality: Left;   PERIPHERAL VASCULAR ULTRASOUND/IVUS  06/26/2023   Procedure: Peripheral Vascular Ultrasound/IVUS;  Surgeon: Magda Debby SAILOR, MD;  [facility];  Service: Cardiovascular;;   POLYPECTOMY  09/29/2023   Procedure: POLYPECTOMY, STOMACH AND COLON;  Surgeon: Elicia Claw, MD;  Location: [facility];  Service: Gastroenterology;;    Social History   Socioeconomic History   Marital status: Married    Spouse name: Not on file   Number of children: Not on file   Years of education: Not on file   Highest education level: Not on file  Occupational History   Not on file  Tobacco Use   Smoking status: Never   Smokeless tobacco: Never   Tobacco comments:    n/a  Vaping Use   Vaping  status: Never Used  Substance and Sexual Activity   Alcohol use: No   Drug use: No   Sexual activity: Not on file  Other Topics Concern   Not on file  Social History Narrative   Not on file   Social Drivers of Health   Financial Resource Strain: Low Risk  (11/11/2023)   Overall Financial Resource Strain (CARDIA)    Difficulty of Paying Living Expenses: Not hard at all  Food Insecurity: No Food Insecurity (11/11/2023)   Hunger Vital Sign    Worried About Running Out of Food in the Last Year: Never true    Ran Out of Food in the Last Year: Never true  Transportation Needs: No Transportation Needs (11/11/2023)   PRAPARE - Administrator, Civil Service (Medical): No    Lack of Transportation (Non-Medical): No  Physical Activity: Insufficiently Active (11/11/2023)   Exercise Vital Sign    Days of Exercise per Week: 1 day    Minutes of Exercise per Session: 60 min  Stress: No Stress Concern Present (11/11/2023)   [employer] of Occupational Health - Occupational Stress Questionnaire    Feeling of Stress : Not at all  Social Connections: Moderately Integrated (11/11/2023)   Social Connection and Isolation Panel    Frequency of Communication with Friends and Family: Three times a week    Frequency of Social Gatherings with Friends and Family: Twice a week  Attends Religious Services: More than 4 times per year    Active Member of Clubs or Organizations: No    Attends Banker Meetings: Never    Marital Status: Married  Catering Manager Violence: Not At Risk (11/11/2023)   Humiliation, Afraid, Rape, and Kick questionnaire    Fear of Current or Ex-Partner: No    Emotionally Abused: No    Physically Abused: No    Sexually Abused: No    Family History  Problem Relation Age of Onset   Hypertension Mother    Multiple myeloma Mother    Hypertension Father    Diabetes Father     Allergies as of 04/26/2024   (No Known Allergies)    Current Outpatient  Medications on File Prior to Visit  Medication Sig Dispense Refill   acyclovir  (ZOVIRAX ) 400 MG tablet Take 1 tablet (400 mg total) by mouth daily. (Patient not taking: Reported on 03/10/2024) 90 tablet 2   atorvastatin  (LIPITOR) 40 MG tablet TAKE 1 TABLET BY MOUTH EVERY DAY 90 tablet 1   B-D ULTRAFINE III SHORT PEN 31G X 8 MM MISC Inject into the skin as directed.     calcium  carbonate (TUMS EX) 750 MG chewable tablet Chew 1 tablet by mouth daily.     Continuous Glucose Sensor (DEXCOM G7 SENSOR) MISC USE AS DIRECTED TO CHECK BLOOD SUGARS. CHANGE EVERY 10 DAYS 1 each 3   cyanocobalamin  (VITAMIN B12) 1000 MCG/ML injection Inject 1 mL (1,000 mcg total) into the muscle every 30 (thirty) days. 9 mL 1   dexamethasone  (DECADRON ) 4 MG tablet TAKE 2 TABLETS IN THE MORNING WITH BREAKFAST ONCE EVERY WEEK BEFORE CHEMOTHERAPY (Patient taking differently: TAKE 1 TABLET IN THE MORNING WITH BREAKFAST ONCE EVERY WEEK BEFORE CHEMOTHERAPY)     Dulaglutide  (TRULICITY ) 1.5 MG/0.5ML SOAJ Inject 1.5 mg into the skin every Sunday.     ELIQUIS  5 MG TABS tablet TAKE 1 TABLET BY MOUTH TWICE A DAY 60 tablet 2   enalapril  (VASOTEC ) 10 MG tablet TAKE 1 TABLET BY MOUTH EVERY DAY 90 tablet 0   glucose blood test strip Use as instructed to test blood sugar 3 times a day. Dx code: e11.65 100 each 2   insulin  glargine (LANTUS ) 100 UNIT/ML injection Inject 0.03 mLs (3 Units total) into the skin at bedtime.     lenalidomide  (REVLIMID ) 5 MG capsule TAKE 1 CAPSULE BY MOUTH 1 TIME A DAY FOR 21 DAYS ON THEN 7 DAYS OFF 21 capsule 0   omeprazole  (PRILOSEC) 40 MG capsule TAKE 1 CAPSULE (40 MG TOTAL) BY MOUTH DAILY. 90 capsule 1   ondansetron  (ZOFRAN ) 8 MG tablet Take 8 mg by mouth every 8 (eight) hours as needed for nausea or vomiting.     ONETOUCH VERIO test strip USE AS INSTRUCTED TO CHECK BLOOD SUGARS TWICE DAILY E11.69 200 strip 1   oxyCODONE  10 MG TABS Take 1 tablet (10 mg total) by mouth every 4 (four) hours as needed for severe pain  (pain score 7-10). 60 tablet 0   prochlorperazine  (COMPAZINE ) 10 MG tablet Take 10 mg by mouth every 6 (six) hours as needed for nausea or vomiting.     sildenafil  (VIAGRA ) 100 MG tablet Take 1 tablet (100 mg total) by mouth as needed for erectile dysfunction. TAKE 1 TABLET BY MOUTH EVERY DAY prn 10 tablet 2   SYRINGE-NEEDLE, DISP, 3 ML (B-D INTEGRA SYRINGE) 25G X 5/8 3 ML MISC USE AS DIRECTED 12 each 24   No current facility-administered medications  on file prior to visit.   REVIEW OF SYSTEMS:  Review of Systems  Respiratory:  Negative for shortness of breath.   Cardiovascular:  Negative for chest pain, palpitations and leg swelling.   PHYSICAL EXAMINATION:  Physical Exam Musculoskeletal:        General: No swelling or tenderness.  Skin:    Findings: No bruising or erythema.  Psychiatric:        Mood and Affect: Mood normal.        Behavior: Behavior normal.        Thought Content: Thought content normal.   Villalta Score for Post-Thrombotic Syndrome: Pain: Absent Cramps: Absent Heaviness: Absent Paresthesia: Absent Pruritus: Absent Pretibial Edema: Absent Skin Induration: Absent Hyperpigmentation: Absent Redness: Absent Venous Ectasia: Absent Pain on calf compression: Absent Villalta Preliminary Score: 0 Is venous ulcer present?: No If venous ulcer is present and score is <15, then 15 points total are assigned: Absent Villalta Total Score: 0  LABS:  CBC     Component Value Date/Time   WBC 5.1 04/25/2024 1557   RBC 3.34 (L) 04/25/2024 1557   HGB 10.1 (L) 04/25/2024 1557   HGB 10.3 (L) 04/19/2024 0941   HGB 12.1 (L) 03/31/2022 1659   HCT 30.6 (L) 04/25/2024 1557   HCT 36.0 (L) 03/31/2022 1659   PLT 220 04/25/2024 1557   PLT 242 04/19/2024 0941   PLT 236 03/31/2022 1659   MCV 91.6 04/25/2024 1557   MCV 85 03/31/2022 1659   MCH 30.2 04/25/2024 1557   MCHC 33.0 04/25/2024 1557   RDW 12.6 04/25/2024 1557   RDW 13.2 03/31/2022 1659   LYMPHSABS 1.2 04/25/2024  1557   MONOABS 0.8 04/25/2024 1557   EOSABS 0.1 04/25/2024 1557   BASOSABS 0.0 04/25/2024 1557    Hepatic Function      Component Value Date/Time   PROT 6.9 04/25/2024 1557   PROT 7.7 08/18/2022 1518   ALBUMIN 4.3 04/25/2024 1557   ALBUMIN 4.3 08/18/2022 1518   AST 27 04/25/2024 1557   AST 19 04/19/2024 0941   ALT 15 04/25/2024 1557   ALT 13 04/19/2024 0941   ALKPHOS 99 04/25/2024 1557   BILITOT 0.5 04/25/2024 1557   BILITOT 0.7 04/19/2024 0941    Renal Function   Lab Results  Component Value Date   CREATININE 1.43 (H) 04/25/2024   CREATININE 1.39 (H) 04/19/2024   CREATININE 1.31 (H) 04/05/2024    Estimated Creatinine Clearance: 63.1 mL/min (A) (by C-G formula based on SCr of 1.43 mg/dL (H)).   [facility] Vascular Lab Studies:  04/25/24 doppler FINDINGS: LEFT LOWER EXTREMITY   Common Femoral Vein: Partially compressible. Vascular Doppler flow is present.   Saphenofemoral Junction: Partially compressible.   Profunda Femoral Vein: Partially compressible. Vascular Doppler flow is present.   Femoral Vein: Noncompressible. No significant vascular Doppler flow noted in the distal portion.   Popliteal Vein: Noncompressible. No significant vascular Doppler flow noted.   Calf Veins: No evidence of thrombus. Normal compressibility and flow on color Doppler imaging.   Superficial Great Saphenous Vein: No evidence of thrombus. Normal compressibility.   Other Findings:  None.   IMPRESSION: 1. Extensive deep venous thrombosis in the left leg extending from the popliteal vein upstream to the common femoral vein. While the majority of the DVT is nonocclusive, there are occlusive portions in the distal femoral and popliteal veins. 2. Negative for deep venous thrombosis in the right leg.  04/25/24 CT Angio Chest PE  IMPRESSION: 1. No pulmonary embolism. 2. Small  left pleural effusion with left basilar atelectasis.  11/30/23  VAS US  LOWER EXTREMITY VENOUS LEFT  (DVT) +-----+---------------+---------+-----------+----------+--------------+  RIGHTCompressibilityPhasicitySpontaneityPropertiesThrombus Aging  +-----+---------------+---------+-----------+----------+--------------+  CFV Full           Yes      Yes                                  +-----+---------------+---------+-----------+----------+--------------+   +---------+---------------+---------+-----------+----------+--------------+   LEFT    CompressibilityPhasicitySpontaneityPropertiesThrombus  Aging  +---------+---------------+---------+-----------+----------+--------------+   CFV     Partial        Yes      Yes                  Chronic          +---------+---------------+---------+-----------+----------+--------------+   SFJ     Full                                                          +---------+---------------+---------+-----------+----------+--------------+   FV Prox  Partial                                      Chronic          +---------+---------------+---------+-----------+----------+--------------+   FV Mid   None                                         Chronic          +---------+---------------+---------+-----------+----------+--------------+   FV DistalNone                                         Chronic          +---------+---------------+---------+-----------+----------+--------------+   PFV     Full                                                          +---------+---------------+---------+-----------+----------+--------------+   POP     None           Yes      Yes                  Chronic          +---------+---------------+---------+-----------+----------+--------------+   PTV     Full                                                          +---------+---------------+---------+-----------+----------+--------------+   PERO    Full                                                           +---------+---------------+---------+-----------+----------+--------------+  EIV                    Yes      Yes                                   +---------+---------------+---------+-----------+----------+--------------+    Summary:  RIGHT:  - No evidence of common femoral vein obstruction.   LEFT:  - Findings consistent with chronic deep vein thrombosis involving the left  common femoral vein, left femoral vein, and left popliteal vein.   ASSESSMENT: Patient with history of extensive LLE DVT s/p thrombectomy by Dr. Magda on 06/26/23. Follow up imaging after the procedure demonstrated some residual thrombus in the left common femoral vein (nonocclusive), femoral vein (occlusive), and popliteal vein (occlusive). He has remained on Eliquis  long-term managed by hematology/oncology given his current diagnosis of multiple myeloma and treatment with lenalidomide . No missed doses of Eliquis . He was seen in the ED 04/25/24 after a fall and was concerned for recurrent DVT. CT imaging demonstrated no PE. Ultrasound showed nonocclusive thrombus in the left CFV and occlusive thrombus in the left femoral and popliteal veins, consistent with previous imaging. No swelling on exam today or any symptoms consistent with new DVT. Discussed with Dr. Magda who reviewed the patient's images and agrees with Dr. Gretta that there is no new DVT seen on imaging. All is residual thrombus from his initial DVT in January of this year. Recommend continuing Eliquis  with long-term management per hematology/oncology and following up with [facility] as otherwise planned. No barriers to medication access or adherence identified at this time. Patient and wife agreeable with this plan and have no questions at this time.   PLAN: -Continue apixaban  (Eliquis ) 5 mg twice daily. -Expected duration of therapy: per oncology. Therapy started on 06/24/23. -Patient educated on purpose, proper use and potential adverse  effects of apixaban  (Eliquis ). -Discussed importance of taking medication around the same time every day. -Advised patient of medications to avoid (NSAIDs, aspirin  doses >100 mg daily). -Educated that Tylenol  (acetaminophen ) is the preferred analgesic to lower the risk of bleeding. -Advised patient to alert all providers of anticoagulation therapy prior to starting a new medication or having a procedure. -Emphasized importance of monitoring for signs and symptoms of bleeding (abnormal bruising, prolonged bleeding, nose bleeds, bleeding from gums, discolored urine, black tarry stools). -Educated patient to present to the ED if emergent signs and symptoms of new thrombosis occur. -Counseled patient to wear compression stockings daily, removing at night. Continue elevation as needed for swelling.   Follow up: with PCP and oncologist. Vascular surgery as otherwise planned Carmine 2026).  Lum Herald, PharmD, [place], CPP [facility] [phone]

## 2024-04-26 NOTE — Telephone Encounter (Signed)
 Called and given below message. He verbalized understanding and is aware of appt with Dr. Lonn on 11/11. He will call the office back for questions/concerns.

## 2024-04-26 NOTE — Patient Instructions (Signed)
-  Continue apixaban  (Eliquis ) 5 mg twice daily. -Refills are managed by your oncologist. -It is important to take your medication around the same time every day.  -Avoid NSAIDs like ibuprofen  (Advil , Motrin ) and naproxen (Aleve) as well as aspirin  doses over 100 mg daily. -Tylenol  (acetaminophen ) is the preferred over the counter pain medication to lower the risk of bleeding. -Be sure to alert all of your health care providers that you are taking an anticoagulant prior to starting a new medication or having a procedure. -Monitor for signs and symptoms of bleeding (abnormal bruising, prolonged bleeding, nose bleeds, bleeding from gums, discolored urine, black tarry stools). If you have fallen and hit your head OR if your bleeding is severe or not stopping, seek emergency care.  -Go to the emergency room if emergent signs and symptoms of new clot occur (new or worse swelling and pain in an arm or leg, shortness of breath, chest pain, fast or irregular heartbeats, lightheadedness, dizziness, fainting, coughing up blood) or if you experience a significant color change (pale or blue) in the extremity that has the DVT.  -We recommend you wear compression stockings (20-30 mmHg) as long as you are having swelling or pain. Be sure to purchase the correct size and take them off at night.   If you have any questions or need to reschedule an appointment, please call [phone]. If you are having an emergency, call 911 or present to the nearest emergency room.   What is a DVT?  -Deep vein thrombosis (DVT) is a condition in which a blood clot forms in a vein of the deep venous system which can occur in the lower leg, thigh, pelvis, arm, or neck. This condition is serious and can be life-threatening if the clot travels to the arteries of the lungs and causing a blockage (pulmonary embolism, PE). A DVT can also damage veins in the leg, which can lead to long-term venous disease, leg pain, swelling, discoloration, and  ulcers or sores (post-thrombotic syndrome).  -Treatment may include taking an anticoagulant medication to prevent more clots from forming and the current clot from growing, wearing compression stockings, and/or surgical procedures to remove or dissolve the clot.

## 2024-04-26 NOTE — Telephone Encounter (Signed)
-----   Message from Almarie Bedford sent at 04/26/2024 10:09 AM EST ----- I reviewed his ER visit He has appt at the DVT clinic, ask him to keep that Can you add 20 mins appt next week for me to see him on 11/11? Put it at 1120 am slot and I will see him in infusion Thanks

## 2024-04-27 ENCOUNTER — Other Ambulatory Visit: Payer: Self-pay | Admitting: Internal Medicine

## 2024-05-03 ENCOUNTER — Inpatient Hospital Stay

## 2024-05-03 ENCOUNTER — Inpatient Hospital Stay (HOSPITAL_BASED_OUTPATIENT_CLINIC_OR_DEPARTMENT_OTHER): Admitting: Hematology and Oncology

## 2024-05-03 ENCOUNTER — Inpatient Hospital Stay: Attending: Hematology and Oncology

## 2024-05-03 ENCOUNTER — Other Ambulatory Visit: Payer: Self-pay | Admitting: Hematology and Oncology

## 2024-05-03 ENCOUNTER — Encounter: Payer: Self-pay | Admitting: Hematology and Oncology

## 2024-05-03 VITALS — BP 132/56 | HR 76 | Temp 98.6°F | Resp 18 | Wt 228.4 lb

## 2024-05-03 DIAGNOSIS — Z5112 Encounter for antineoplastic immunotherapy: Secondary | ICD-10-CM | POA: Diagnosis not present

## 2024-05-03 DIAGNOSIS — Z5111 Encounter for antineoplastic chemotherapy: Secondary | ICD-10-CM | POA: Diagnosis not present

## 2024-05-03 DIAGNOSIS — D539 Nutritional anemia, unspecified: Secondary | ICD-10-CM

## 2024-05-03 DIAGNOSIS — R5381 Other malaise: Secondary | ICD-10-CM

## 2024-05-03 DIAGNOSIS — Z7901 Long term (current) use of anticoagulants: Secondary | ICD-10-CM | POA: Diagnosis not present

## 2024-05-03 DIAGNOSIS — M6281 Muscle weakness (generalized): Secondary | ICD-10-CM | POA: Diagnosis not present

## 2024-05-03 DIAGNOSIS — C9000 Multiple myeloma not having achieved remission: Secondary | ICD-10-CM

## 2024-05-03 DIAGNOSIS — Z86711 Personal history of pulmonary embolism: Secondary | ICD-10-CM | POA: Diagnosis not present

## 2024-05-03 DIAGNOSIS — I2694 Multiple subsegmental pulmonary emboli without acute cor pulmonale: Secondary | ICD-10-CM

## 2024-05-03 LAB — CBC WITH DIFFERENTIAL (CANCER CENTER ONLY)
Abs Immature Granulocytes: 0.07 K/uL (ref 0.00–0.07)
Basophils Absolute: 0 K/uL (ref 0.0–0.1)
Basophils Relative: 1 %
Eosinophils Absolute: 0.2 K/uL (ref 0.0–0.5)
Eosinophils Relative: 3 %
HCT: 32.1 % — ABNORMAL LOW (ref 39.0–52.0)
Hemoglobin: 10.6 g/dL — ABNORMAL LOW (ref 13.0–17.0)
Immature Granulocytes: 1 %
Lymphocytes Relative: 23 %
Lymphs Abs: 1.3 K/uL (ref 0.7–4.0)
MCH: 29.5 pg (ref 26.0–34.0)
MCHC: 33 g/dL (ref 30.0–36.0)
MCV: 89.4 fL (ref 80.0–100.0)
Monocytes Absolute: 0.3 K/uL (ref 0.1–1.0)
Monocytes Relative: 5 %
Neutro Abs: 3.7 K/uL (ref 1.7–7.7)
Neutrophils Relative %: 67 %
Platelet Count: 227 K/uL (ref 150–400)
RBC: 3.59 MIL/uL — ABNORMAL LOW (ref 4.22–5.81)
RDW: 12.8 % (ref 11.5–15.5)
WBC Count: 5.5 K/uL (ref 4.0–10.5)
nRBC: 0 % (ref 0.0–0.2)

## 2024-05-03 LAB — CMP (CANCER CENTER ONLY)
ALT: 12 U/L (ref 0–44)
AST: 18 U/L (ref 15–41)
Albumin: 4.2 g/dL (ref 3.5–5.0)
Alkaline Phosphatase: 95 U/L (ref 38–126)
Anion gap: 7 (ref 5–15)
BUN: 20 mg/dL (ref 8–23)
CO2: 27 mmol/L (ref 22–32)
Calcium: 9.8 mg/dL (ref 8.9–10.3)
Chloride: 102 mmol/L (ref 98–111)
Creatinine: 1.52 mg/dL — ABNORMAL HIGH (ref 0.61–1.24)
GFR, Estimated: 49 mL/min — ABNORMAL LOW (ref >60)
Glucose, Bld: 144 mg/dL — ABNORMAL HIGH (ref 70–99)
Potassium: 4.2 mmol/L (ref 3.5–5.1)
Sodium: 136 mmol/L (ref 135–145)
Total Bilirubin: 0.5 mg/dL (ref 0.0–1.2)
Total Protein: 7.2 g/dL (ref 6.5–8.1)

## 2024-05-03 MED ORDER — BORTEZOMIB CHEMO SQ INJECTION 3.5 MG (2.5MG/ML)
1.3000 mg/m2 | Freq: Once | INTRAMUSCULAR | Status: AC
  Administered 2024-05-03: 3 mg via SUBCUTANEOUS
  Filled 2024-05-03: qty 1.2

## 2024-05-03 MED ORDER — DARATUMUMAB-HYALURONIDASE-FIHJ 1800-30000 MG-UT/15ML ~~LOC~~ SOLN
1800.0000 mg | Freq: Once | SUBCUTANEOUS | Status: AC
  Administered 2024-05-03: 1800 mg via SUBCUTANEOUS
  Filled 2024-05-03: qty 15

## 2024-05-03 MED ORDER — ACETAMINOPHEN 325 MG PO TABS
650.0000 mg | ORAL_TABLET | Freq: Once | ORAL | Status: AC
  Administered 2024-05-03: 650 mg via ORAL
  Filled 2024-05-03: qty 2

## 2024-05-03 MED ORDER — DIPHENHYDRAMINE HCL 25 MG PO CAPS
25.0000 mg | ORAL_CAPSULE | Freq: Once | ORAL | Status: AC
  Administered 2024-05-03: 25 mg via ORAL
  Filled 2024-05-03: qty 1

## 2024-05-03 NOTE — Assessment & Plan Note (Addendum)
 Overall his anemia is improving He is aware that lenalidomide  can cause anemia Will monitor his blood counts carefully

## 2024-05-03 NOTE — Patient Instructions (Signed)
 [facility]  Discharge Instructions: Thank you for choosing [facility] to provide your oncology and hematology care.   If you have a lab appointment with the Cancer Center, please go directly to the Cancer Center and check in at the registration area.   Wear comfortable clothing and clothing appropriate for easy access to any Portacath or PICC line.   We strive to give you quality time with your provider. You may need to reschedule your appointment if you arrive late (15 or more minutes).  Arriving late affects you and other patients whose appointments are after yours.  Also, if you miss three or more appointments without notifying the office, you may be dismissed from the clinic at the provider's discretion.      For prescription refill requests, have your pharmacy contact our office and allow 72 hours for refills to be completed.    Today you received the following chemotherapy and/or immunotherapy agents Velcade, Darzalex Faspro.    To help prevent nausea and vomiting after your treatment, we encourage you to take your nausea medication as directed.  BELOW ARE SYMPTOMS THAT SHOULD BE REPORTED IMMEDIATELY: *FEVER GREATER THAN 100.4 F (38 C) OR HIGHER *CHILLS OR SWEATING *NAUSEA AND VOMITING THAT IS NOT CONTROLLED WITH YOUR NAUSEA MEDICATION *UNUSUAL SHORTNESS OF BREATH *UNUSUAL BRUISING OR BLEEDING *URINARY PROBLEMS (pain or burning when urinating, or frequent urination) *BOWEL PROBLEMS (unusual diarrhea, constipation, pain near the anus) TENDERNESS IN MOUTH AND THROAT WITH OR WITHOUT PRESENCE OF ULCERS (sore throat, sores in mouth, or a toothache) UNUSUAL RASH, SWELLING OR PAIN  UNUSUAL VAGINAL DISCHARGE OR ITCHING   Items with * indicate a potential emergency and should be followed up as soon as possible or go to the Emergency Department if any problems should occur.  Please show the CHEMOTHERAPY ALERT CARD or  IMMUNOTHERAPY ALERT CARD at check-in to the Emergency Department and triage nurse.  Should you have questions after your visit or need to cancel or reschedule your appointment, please contact [facility]  Dept: [phone]  and follow the prompts.  Office hours are 8:00 a.m. to 4:30 p.m. Monday - Friday. Please note that voicemails left after 4:00 p.m. may not be returned until the following business day.  We are closed weekends and major holidays. You have access to a nurse at all times for urgent questions. Please call the main number to the clinic Dept: [phone] and follow the prompts.   For any non-urgent questions, you may also contact your provider using MyChart. We now offer e-Visits for anyone 86 and older to request care online for non-urgent symptoms. For details visit [email or web address].   Also download the MyChart app! Go to the app store, search "MyChart", open the app, select [facility], and log in with your MyChart username and password.

## 2024-05-03 NOTE — Assessment & Plan Note (Addendum)
 He had underwent significant evaluation recently He will continue anticoagulation therapy

## 2024-05-03 NOTE — Assessment & Plan Note (Addendum)
 The patient have prior diagnosis of smoldering myeloma.  He was referred to Dr. 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 PET/CT imaging was reviewed with the patient and his wife which show no evidence of significant bone involvement/fractures  So far, he tolerated treatment well except for intermittent reflux and high blood sugar on the days that he takes treatment We discussed the role of bone marrow transplant and for now, he is not keen for transplant evaluation Repeat myeloma panel showed positive response to therapy We discussed adding lenalidomide  for deeper response, started in September  Recent myeloma panel from October showed positive control of his disease Repeat myeloma panel from today is pending For now, he will continue lenalidomide  21 days on, 7 days off He will get daratumumab  every 28 days He will get Velcade  every other week He will take weekly dexamethasone ; recent dose is tapered due to reflux He will continue taking Eliquis  for DVT prophylaxis He will continue taking calcium , vitamin D  supplement and Zometa  every 3 months, next due in November

## 2024-05-03 NOTE — Assessment & Plan Note (Addendum)
 He has progressive muscle weakness and went to the emergency department when his knee buckled under his weight He is noted to have signs of sarcopenia from muscle wasting especially on bilateral quadriceps muscles In going through his diet history, he is not eating enough protein We emphasize importance of adequate protein intake and gentle exercise as tolerated Once he is able to eat better, I will refer him to physical therapy for rehab

## 2024-05-03 NOTE — Progress Notes (Signed)
  [facility] OFFICE PROGRESS NOTE  Patient Care Team: Jarold Medici, MD as PCP - General (Internal Medicine) [name], RN as Oncology Nurse Navigator  Assessment & Plan Multiple myeloma not having achieved remission [facility]) The patient have prior diagnosis of smoldering myeloma.  He was referred to Dr. 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 PET/CT imaging was reviewed with the patient and his wife which show no evidence of significant bone involvement/fractures  So far, he tolerated treatment well except for intermittent reflux and high blood sugar on the days that he takes treatment We discussed the role of bone marrow transplant and for now, he is not keen for transplant evaluation Repeat myeloma panel showed positive response to therapy We discussed adding lenalidomide  for deeper response, started in September  Recent myeloma panel from October showed positive control of his disease Repeat myeloma panel from today is pending For now, he will continue lenalidomide  21 days on, 7 days off He will get daratumumab  every 28 days He will get Velcade  every other week He will take weekly dexamethasone ; recent dose is tapered due to reflux He will continue taking Eliquis  for DVT prophylaxis He will continue taking calcium , vitamin D  supplement and Zometa  every 3 months, next due in November Deficiency anemia Overall his anemia is improving He is aware that lenalidomide  can cause anemia Will monitor his blood counts carefully Physical debility He has progressive muscle weakness and went to the emergency department when his knee buckled under his weight He is noted to have signs of  sarcopenia from muscle wasting especially on bilateral quadriceps muscles In going through his diet history, he is not eating enough protein We emphasize importance of adequate protein intake and gentle exercise as tolerated Once he is able to eat better, I will refer him to physical therapy for rehab Multiple subsegmental pulmonary emboli without acute cor pulmonale (HCC) He had underwent significant evaluation recently He will continue anticoagulation therapy  No orders of the defined types were placed in this encounter.    Almarie Bedford, MD  INTERVAL HISTORY: he returns for treatment follow-up Complications related to previous cycle of chemotherapy included anemia,, recent hospitalization/ ER visit,, and physical weakness He went to the ER recently I reviewed multiple different investigations From the chemo perspective, he tolerated treatment fairly well He is very concerned about his weakness which I addressed above  PHYSICAL EXAMINATION: ECOG PERFORMANCE STATUS: 2 - Symptomatic, <50% confined to bed  No results found for: CAN125    Latest Ref Rng & Units 05/03/2024    9:06 AM 04/25/2024    3:57 PM 04/19/2024    9:41 AM  CBC  WBC 4.0 - 10.5 K/uL 5.5  5.1  3.7   Hemoglobin 13.0 - 17.0 g/dL 89.3  89.8  89.6   Hematocrit 39.0 - 52.0 % 32.1  30.6  31.2   Platelets 150 - 400 K/uL 227  220  242       Chemistry      Component Value Date/Time   NA 136 05/03/2024 0906   NA 137 11/05/2022 1152   K 4.2 05/03/2024 0906   CL 102 05/03/2024 0906   CO2 27 05/03/2024 0906   BUN 20 05/03/2024  0906   BUN 11 11/05/2022 1152   CREATININE 1.52 (H) 05/03/2024 0906      Component Value Date/Time   CALCIUM  9.8 05/03/2024 0906   ALKPHOS 95 05/03/2024 0906   AST 18 05/03/2024 0906   ALT 12 05/03/2024 0906   BILITOT 0.5 05/03/2024 0906       There were no vitals filed for this visit. There were no vitals filed for this visit. Other relevant data reviewed during this visit included  CBC, CMP, recent CT imaging of the chest and venous Doppler ultrasound

## 2024-05-03 NOTE — Progress Notes (Signed)
 Patient states he took Dexamethasone  today prior to infusion appt.

## 2024-05-04 ENCOUNTER — Telehealth: Payer: Self-pay

## 2024-05-04 LAB — KAPPA/LAMBDA LIGHT CHAINS
Kappa free light chain: 48.6 mg/L — ABNORMAL HIGH (ref 3.3–19.4)
Kappa, lambda light chain ratio: 5.4 — ABNORMAL HIGH (ref 0.26–1.65)
Lambda free light chains: 9 mg/L (ref 5.7–26.3)

## 2024-05-04 NOTE — Transitions of Care (Post Inpatient/ED Visit) (Signed)
   05/04/2024  Name: Mayson Mcneish MRN: [id number] DOB: 1956/03/09  Today's TOC FU Call Status:    Attempted to reach the patient regarding the most recent Inpatient/ED visit.  Follow Up Plan: Additional outreach attempts will be made to reach the patient to complete the Transitions of Care (Post Inpatient/ED visit) call.   [place], CMA

## 2024-05-05 ENCOUNTER — Telehealth (INDEPENDENT_AMBULATORY_CARE_PROVIDER_SITE_OTHER): Payer: Self-pay | Admitting: Internal Medicine

## 2024-05-05 ENCOUNTER — Encounter: Payer: Self-pay | Admitting: Internal Medicine

## 2024-05-05 DIAGNOSIS — R5381 Other malaise: Secondary | ICD-10-CM | POA: Diagnosis not present

## 2024-05-05 DIAGNOSIS — R296 Repeated falls: Secondary | ICD-10-CM

## 2024-05-05 DIAGNOSIS — R269 Unspecified abnormalities of gait and mobility: Secondary | ICD-10-CM | POA: Diagnosis not present

## 2024-05-05 DIAGNOSIS — R630 Anorexia: Secondary | ICD-10-CM

## 2024-05-05 NOTE — Patient Instructions (Signed)
 Weakness: What to Know Weakness means you feel like you don't have enough strength. You might feel weak all over your body or just in one part. Common causes of weakness include: Infections that make your body fight hard. Feeling very tired from too much activity. Having anemia. This is when you don't have enough red blood cells. Losing a lot of blood. Dehydration. This is when there's not enough water in your body. Low minerals in your body, like potassium. Long-term (chronic) kidney or liver disease. Cancer. Other causes include: Some medicines or cancer treatments. Feeling worried, nervous, sad, or hopeless. Having a stroke. Problems with: Your heart. Your nervous system. Your thyroid . Poor sleep or trouble sleeping. Loss of muscle strength from getting older or not moving enough. The cause of your weakness may not be known. Some causes can be serious, so be sure to talk with your doctor if you feel weak. Follow these instructions at home: Activity Rest as needed. Get enough sleep. Most adults need 7-8 hours of good sleep each night. Talk to your doctor about how much sleep is best for you. Exercise for at least 30 minutes, 2 days a week. You can do things like arm curls and leg raises to help make your muscles stronger. Do exercises as told. Think about working with a physical therapist or trainer. They can help make a workout plan for you. General instructions  Take your medicines only as told. Eat a healthy diet. It should include: Proteins like low-fat (lean) meats and fish. These help build muscles. Fresh fruits and vegetables for vitamins. Carbohydrates like whole grains. These help give you energy. Drink more fluids as told. Keep all follow-up visits. Your doctor will check how you're doing. They'll change your treatments as needed. Contact a doctor if: Your weakness: Doesn't get better. Gets worse. Makes it hard for you to think clearly. Stops you from doing your  normal activities. Get help right away if: You feel weak all of a sudden, especially on one side of your face or body. You have chest pain. You feel short of breath. You have trouble: Breathing. Seeing. Talking. Swallowing. Standing. Walking. You feel light-headed, or you faint. These symptoms may be an emergency. Call 911 right away. Do not wait to see if the symptoms will go away. Do not drive yourself to the hospital. This information is not intended to replace advice given to you by your health care provider. Make sure you discuss any questions you have with your health care provider. Document Revised: 07/12/2023 Document Reviewed: 07/12/2023 Elsevier Patient Education  2025 [employer].

## 2024-05-05 NOTE — Assessment & Plan Note (Signed)
 He is agreeable to HHPT.

## 2024-05-05 NOTE — Assessment & Plan Note (Addendum)
 Significant weight loss due to poor appetite. Improved intake with protein shakes and meals. His wife has been encouraging him to eat . Appetite stimulant considered if weight loss persists. - Continue protein shakes and three meals daily. - Monitor weight weekly at home. - Consider appetite stimulant if weight loss continues. - Re-evaluate in four weeks for in-person visit.

## 2024-05-05 NOTE — Assessment & Plan Note (Signed)
 ER and Oncology records reviewed. He has had persistent weakness. He agrees to HHPT first, prior to outpatient PT evaluation. I agree with Oncology, he needs to improve his appetite and optimize nutrition prior to starting outpatient PT.  Recent fall due to knee instability. - Referred to home health physical therapy for strengthening exercises.

## 2024-05-05 NOTE — Progress Notes (Signed)
 Virtual Visit via Video Note  I,[name], CMA,acting as a neurosurgeon for Catheryn LOISE Slocumb, MD.,have documented all relevant documentation on the behalf of Catheryn LOISE Slocumb, MD,as directed by  Catheryn LOISE Slocumb, MD while in the presence of Catheryn LOISE Slocumb, MD.  I connected with William Mcguire on 05/05/24 at  4:00 PM EST by a video enabled telemedicine application and verified that I am speaking with the correct person using two identifiers.  Patient Location: Home Provider Location: Office/Clinic  I discussed the limitations, risks, security, and privacy concerns of performing an evaluation and management service by video and the availability of in person appointments. I also discussed with the patient that there may be a patient responsible charge related to this service. The patient expressed understanding and agreed to proceed.  Subjective: PCP: Slocumb Catheryn, MD  Chief Complaint  Patient presents with   Follow-up    Patient presents virtually today for ED follow up. He went to [facility] on 11/03 for pain in both legs.     Discussed the use of AI scribe software for clinical note transcription with the patient, who gave verbal consent to proceed.  History of Present Illness William Mcguire is a 68 year old male who presents with bilateral leg pain and weakness after a thrombectomy. He is accompanied by his wife, [name].  He presents for a follow-up after an ER visit on November 3rd due to bilateral leg pain and weakness, with the left leg being more painful following a recent thrombectomy. He has experienced falls due to his knees buckling, with the most recent fall occurring yesterday while descending a step. His knees are swollen and sore, and he has been using ice and heat for relief. He reports that he has not been upstairs in his home, which has 14 steps, in a while.  He reports a significant decrease in appetite, stating 'no appetite' and that he could only eat  breakfast in the morning. He has lost eight pounds since October 15th. He is currently consuming one protein shake per day along with breakfast and is attempting to increase his intake to three meals a day with the addition of a protein shake as a snack. No nausea when eating and no issues with the protein shakes. Energy levels have improved with increased food intake.  His blood sugars have been stable, around 106-107. He is currently on Trulicity  and [name] , with patient assistance renewal for [name]  pending. He confirms that his sugars are stable and that he has been testing regularly.    ROS: Per HPI  Current Outpatient Medications:    acyclovir  (ZOVIRAX ) 400 MG tablet, Take 1 tablet (400 mg total) by mouth daily. (Patient not taking: Reported on 03/10/2024), Disp: 90 tablet, Rfl: 2   atorvastatin  (LIPITOR) 40 MG tablet, TAKE 1 TABLET BY MOUTH EVERY DAY, Disp: 90 tablet, Rfl: 1   B-D ULTRAFINE III SHORT PEN 31G X 8 MM MISC, Inject into the skin as directed., Disp: , Rfl:    calcium  carbonate (TUMS EX) 750 MG chewable tablet, Chew 1 tablet by mouth daily., Disp: , Rfl:    Continuous Glucose Sensor (DEXCOM G7 SENSOR) MISC, USE AS DIRECTED TO CHECK BLOOD SUGARS. CHANGE EVERY 10 DAYS, Disp: 3 each, Rfl: 3   cyanocobalamin  (VITAMIN B12) 1000 MCG/ML injection, Inject 1 mL (1,000 mcg total) into the muscle every 30 (thirty) days., Disp: 9 mL, Rfl: 1   dexamethasone  (DECADRON ) 4 MG tablet, TAKE 2 TABLETS IN THE  MORNING WITH BREAKFAST ONCE EVERY WEEK BEFORE CHEMOTHERAPY (Patient taking differently: TAKE 1 TABLET IN THE MORNING WITH BREAKFAST ONCE EVERY WEEK BEFORE CHEMOTHERAPY), Disp: , Rfl:    Dulaglutide  (TRULICITY ) 1.5 MG/0.5ML SOAJ, Inject 1.5 mg into the skin every Sunday., Disp: , Rfl:    ELIQUIS  5 MG TABS tablet, TAKE 1 TABLET BY MOUTH TWICE A DAY, Disp: 60 tablet, Rfl: 2   enalapril  (VASOTEC ) 10 MG tablet, TAKE 1 TABLET BY MOUTH EVERY DAY, Disp: 90 tablet, Rfl: 0   glucose blood test strip,  Use as instructed to test blood sugar 3 times a day. Dx code: e11.65, Disp: 100 each, Rfl: 2   insulin  glargine (LANTUS ) 100 UNIT/ML injection, Inject 0.03 mLs (3 Units total) into the skin at bedtime., Disp: , Rfl:    lenalidomide  (REVLIMID ) 5 MG capsule, TAKE 1 CAPSULE BY MOUTH 1 TIME A DAY FOR 21 DAYS ON THEN 7 DAYS OFF, Disp: 21 capsule, Rfl: 0   omeprazole  (PRILOSEC) 40 MG capsule, TAKE 1 CAPSULE (40 MG TOTAL) BY MOUTH DAILY., Disp: 90 capsule, Rfl: 1   ondansetron  (ZOFRAN ) 8 MG tablet, Take 8 mg by mouth every 8 (eight) hours as needed for nausea or vomiting., Disp: , Rfl:    ONETOUCH VERIO test strip, USE AS INSTRUCTED TO CHECK BLOOD SUGARS TWICE DAILY E11.69, Disp: 200 strip, Rfl: 1   oxyCODONE  10 MG TABS, Take 1 tablet (10 mg total) by mouth every 4 (four) hours as needed for severe pain (pain score 7-10)., Disp: 60 tablet, Rfl: 0   prochlorperazine  (COMPAZINE ) 10 MG tablet, Take 10 mg by mouth every 6 (six) hours as needed for nausea or vomiting., Disp: , Rfl:    sildenafil  (VIAGRA ) 100 MG tablet, Take 1 tablet (100 mg total) by mouth as needed for erectile dysfunction. TAKE 1 TABLET BY MOUTH EVERY DAY prn, Disp: 10 tablet, Rfl: 2   SYRINGE-NEEDLE, DISP, 3 ML (B-D INTEGRA SYRINGE) 25G X 5/8 3 ML MISC, USE AS DIRECTED, Disp: 12 each, Rfl: 24  Observations/Objective: There were no vitals filed for this visit. Wt Readings from Last 3 Encounters:  05/03/24 228 lb 6.4 oz (103.6 kg)  04/19/24 232 lb 3.2 oz (105.3 kg)  04/06/24 236 lb 1.6 oz (107.1 kg)    Physical Exam Vitals and nursing note reviewed.  Constitutional:      Appearance: Normal appearance.  HENT:     Head: Normocephalic and atraumatic.  Eyes:     Extraocular Movements: Extraocular movements intact.  Pulmonary:     Effort: Pulmonary effort is normal.  Musculoskeletal:     Cervical back: Normal range of motion.  Skin:    General: Skin is warm.  Neurological:     General: No focal deficit present.     Mental Status:  He is alert.  Psychiatric:        Mood and Affect: Mood normal.     Assessment and Plan: Physical debility Assessment & Plan: ER and Oncology records reviewed. He has had persistent weakness. He agrees to HHPT first, prior to outpatient PT evaluation. I agree with Oncology, he needs to improve his appetite and optimize nutrition prior to starting outpatient PT.  Recent fall due to knee instability. - Referred to home health physical therapy for strengthening exercises.  Orders: -     Ambulatory referral to Home Health  Frequent falls Assessment & Plan: He is agreeable to HHPT.   Orders: -     Ambulatory referral to Home Health  Gait abnormality -  Ambulatory referral to Home Health  Decreased appetite Assessment & Plan: Significant weight loss due to poor appetite. Improved intake with protein shakes and meals. His wife has been encouraging him to eat . Appetite stimulant considered if weight loss persists. - Continue protein shakes and three meals daily. - Monitor weight weekly at home. - Consider appetite stimulant if weight loss continues. - Re-evaluate in four weeks for in-person visit.     Follow Up Instructions: Return if symptoms worsen or fail to improve.   I discussed the assessment and treatment plan with the patient. The patient was provided an opportunity to ask questions, and all were answered. The patient agreed with the plan and demonstrated an understanding of the instructions.   The patient was advised to call back or seek an in-person evaluation if the symptoms worsen or if the condition fails to improve as anticipated.  The above assessment and management plan was discussed with the patient. The patient verbalized understanding of and has agreed to the management plan.   I, Catheryn LOISE Slocumb, MD, have reviewed all documentation for this visit. The documentation on 05/05/24 for the exam, diagnosis, procedures, and orders are all accurate and complete.

## 2024-05-06 ENCOUNTER — Telehealth: Payer: Self-pay

## 2024-05-06 NOTE — Telephone Encounter (Signed)
 PAP: Patient assistance application for [name] through [employer] has been mailed to pt's home address on file. Provider portion of application will be faxed to provider's office.

## 2024-05-07 LAB — MULTIPLE MYELOMA PANEL, SERUM
Albumin SerPl Elph-Mcnc: 3.7 g/dL (ref 2.9–4.4)
Albumin/Glob SerPl: 1.2 (ref 0.7–1.7)
Alpha 1: 0.3 g/dL (ref 0.0–0.4)
Alpha2 Glob SerPl Elph-Mcnc: 1 g/dL (ref 0.4–1.0)
B-Globulin SerPl Elph-Mcnc: 0.9 g/dL (ref 0.7–1.3)
Gamma Glob SerPl Elph-Mcnc: 1 g/dL (ref 0.4–1.8)
Globulin, Total: 3.1 g/dL (ref 2.2–3.9)
IgA: 31 mg/dL — ABNORMAL LOW (ref 61–437)
IgG (Immunoglobin G), Serum: 1190 mg/dL (ref 603–1613)
IgM (Immunoglobulin M), Srm: 30 mg/dL (ref 20–172)
M Protein SerPl Elph-Mcnc: 0.8 g/dL — ABNORMAL HIGH
Total Protein ELP: 6.8 g/dL (ref 6.0–8.5)

## 2024-05-13 ENCOUNTER — Other Ambulatory Visit: Payer: Self-pay | Admitting: Hematology and Oncology

## 2024-05-13 DIAGNOSIS — C9000 Multiple myeloma not having achieved remission: Secondary | ICD-10-CM

## 2024-05-17 ENCOUNTER — Inpatient Hospital Stay: Admitting: Hematology and Oncology

## 2024-05-17 ENCOUNTER — Encounter: Payer: Self-pay | Admitting: Hematology and Oncology

## 2024-05-17 ENCOUNTER — Inpatient Hospital Stay

## 2024-05-17 VITALS — BP 140/66 | HR 93 | Temp 98.5°F | Resp 18 | Wt 226.9 lb

## 2024-05-17 DIAGNOSIS — Z5111 Encounter for antineoplastic chemotherapy: Secondary | ICD-10-CM | POA: Diagnosis not present

## 2024-05-17 DIAGNOSIS — C3492 Malignant neoplasm of unspecified part of left bronchus or lung: Secondary | ICD-10-CM | POA: Diagnosis not present

## 2024-05-17 DIAGNOSIS — C9000 Multiple myeloma not having achieved remission: Secondary | ICD-10-CM | POA: Diagnosis not present

## 2024-05-17 DIAGNOSIS — N183 Chronic kidney disease, stage 3 unspecified: Secondary | ICD-10-CM

## 2024-05-17 DIAGNOSIS — Z794 Long term (current) use of insulin: Secondary | ICD-10-CM

## 2024-05-17 DIAGNOSIS — N1831 Chronic kidney disease, stage 3a: Secondary | ICD-10-CM

## 2024-05-17 DIAGNOSIS — D539 Nutritional anemia, unspecified: Secondary | ICD-10-CM | POA: Diagnosis not present

## 2024-05-17 DIAGNOSIS — E1122 Type 2 diabetes mellitus with diabetic chronic kidney disease: Secondary | ICD-10-CM

## 2024-05-17 DIAGNOSIS — Z5112 Encounter for antineoplastic immunotherapy: Secondary | ICD-10-CM | POA: Diagnosis not present

## 2024-05-17 DIAGNOSIS — Z7901 Long term (current) use of anticoagulants: Secondary | ICD-10-CM | POA: Diagnosis not present

## 2024-05-17 DIAGNOSIS — Z86711 Personal history of pulmonary embolism: Secondary | ICD-10-CM | POA: Diagnosis not present

## 2024-05-17 DIAGNOSIS — M6281 Muscle weakness (generalized): Secondary | ICD-10-CM | POA: Diagnosis not present

## 2024-05-17 LAB — CMP (CANCER CENTER ONLY)
ALT: 20 U/L (ref 0–44)
AST: 27 U/L (ref 15–41)
Albumin: 4.4 g/dL (ref 3.5–5.0)
Alkaline Phosphatase: 103 U/L (ref 38–126)
Anion gap: 9 (ref 5–15)
BUN: 20 mg/dL (ref 8–23)
CO2: 25 mmol/L (ref 22–32)
Calcium: 9.7 mg/dL (ref 8.9–10.3)
Chloride: 104 mmol/L (ref 98–111)
Creatinine: 1.28 mg/dL — ABNORMAL HIGH (ref 0.61–1.24)
GFR, Estimated: >60 mL/min (ref >60)
Glucose, Bld: 153 mg/dL — ABNORMAL HIGH (ref 70–99)
Potassium: 4.1 mmol/L (ref 3.5–5.1)
Sodium: 137 mmol/L (ref 135–145)
Total Bilirubin: 0.6 mg/dL (ref 0.0–1.2)
Total Protein: 7.1 g/dL (ref 6.5–8.1)

## 2024-05-17 LAB — CBC WITH DIFFERENTIAL (CANCER CENTER ONLY)
Abs Immature Granulocytes: 0.01 K/uL (ref 0.00–0.07)
Basophils Absolute: 0 K/uL (ref 0.0–0.1)
Basophils Relative: 1 %
Eosinophils Absolute: 0.3 K/uL (ref 0.0–0.5)
Eosinophils Relative: 6 %
HCT: 31.1 % — ABNORMAL LOW (ref 39.0–52.0)
Hemoglobin: 10.3 g/dL — ABNORMAL LOW (ref 13.0–17.0)
Immature Granulocytes: 0 %
Lymphocytes Relative: 21 %
Lymphs Abs: 0.9 K/uL (ref 0.7–4.0)
MCH: 29.7 pg (ref 26.0–34.0)
MCHC: 33.1 g/dL (ref 30.0–36.0)
MCV: 89.6 fL (ref 80.0–100.0)
Monocytes Absolute: 0.4 K/uL (ref 0.1–1.0)
Monocytes Relative: 10 %
Neutro Abs: 2.8 K/uL (ref 1.7–7.7)
Neutrophils Relative %: 62 %
Platelet Count: 211 K/uL (ref 150–400)
RBC: 3.47 MIL/uL — ABNORMAL LOW (ref 4.22–5.81)
RDW: 12.9 % (ref 11.5–15.5)
WBC Count: 4.4 K/uL (ref 4.0–10.5)
nRBC: 0 % (ref 0.0–0.2)

## 2024-05-17 MED ORDER — BORTEZOMIB CHEMO SQ INJECTION 3.5 MG (2.5MG/ML)
1.3000 mg/m2 | Freq: Once | INTRAMUSCULAR | Status: AC
  Administered 2024-05-17: 3 mg via SUBCUTANEOUS
  Filled 2024-05-17: qty 1.2

## 2024-05-17 MED ORDER — ZOLEDRONIC ACID 4 MG/100ML IV SOLN
4.0000 mg | Freq: Once | INTRAVENOUS | Status: AC
  Administered 2024-05-17: 4 mg via INTRAVENOUS
  Filled 2024-05-17: qty 100

## 2024-05-17 MED ORDER — SODIUM CHLORIDE 0.9 % IV SOLN: INTRAVENOUS | Status: DC

## 2024-05-17 MED ORDER — ACARBOSE 25 MG PO TABS: 25.0000 mg | ORAL_TABLET | Freq: Three times a day (TID) | ORAL | 1 refills | Status: DC

## 2024-05-17 MED ORDER — PROCHLORPERAZINE MALEATE 10 MG PO TABS
10.0000 mg | ORAL_TABLET | Freq: Once | ORAL | Status: AC
  Administered 2024-05-17: 10 mg via ORAL
  Filled 2024-05-17: qty 1

## 2024-05-17 NOTE — Assessment & Plan Note (Addendum)
 He is doing well and creatinine is improving We discussed importance of hydration

## 2024-05-17 NOTE — Assessment & Plan Note (Addendum)
 This is improving with aggressive dietary modification The patient has been diagnosed with diabetes for over 10 years and has been on insulin  for almost 5 years With recent aggressive lifestyle changes, his blood sugar is improving despite reduced dose insulin   With recent dexamethasone  taper, I recommend discontinuation of insulin  altogether I recommend trial of acarbose  in addition to his current Trulicity  for diabetes management We discussed side effects to be expected

## 2024-05-17 NOTE — Assessment & Plan Note (Addendum)
 PET/CT imaging from August 2025 showed no evidence of disease recurrence We will continue to monitor his lung once every 6 months, next imaging study will be February 2026

## 2024-05-17 NOTE — Patient Instructions (Signed)
 [facility] - A DEPT OF . [facility]  Discharge Instructions: Thank you for choosing [facility] to provide your oncology and hematology care.   If you have a lab appointment with the Cancer Center, please go directly to the Cancer Center and check in at the registration area.   Wear comfortable clothing and clothing appropriate for easy access to any Portacath or PICC line.   We strive to give you quality time with your provider. You may need to reschedule your appointment if you arrive late (15 or more minutes).  Arriving late affects you and other patients whose appointments are after yours.  Also, if you miss three or more appointments without notifying the office, you may be dismissed from the clinic at the provider's discretion.      For prescription refill requests, have your pharmacy contact our office and allow 72 hours for refills to be completed.    Today you received the following chemotherapy and/or immunotherapy agents: bortezomib  SQ (VELCADE )    To help prevent nausea and vomiting after your treatment, we encourage you to take your nausea medication as directed.  BELOW ARE SYMPTOMS THAT SHOULD BE REPORTED IMMEDIATELY: *FEVER GREATER THAN 100.4 F (38 C) OR HIGHER *CHILLS OR SWEATING *NAUSEA AND VOMITING THAT IS NOT CONTROLLED WITH YOUR NAUSEA MEDICATION *UNUSUAL SHORTNESS OF BREATH *UNUSUAL BRUISING OR BLEEDING *URINARY PROBLEMS (pain or burning when urinating, or frequent urination) *BOWEL PROBLEMS (unusual diarrhea, constipation, pain near the anus) TENDERNESS IN MOUTH AND THROAT WITH OR WITHOUT PRESENCE OF ULCERS (sore throat, sores in mouth, or a toothache) UNUSUAL RASH, SWELLING OR PAIN  UNUSUAL VAGINAL DISCHARGE OR ITCHING   Items with * indicate a potential emergency and should be followed up as soon as possible or go to the Emergency Department if any problems should occur.  Please show the CHEMOTHERAPY ALERT CARD or  IMMUNOTHERAPY ALERT CARD at check-in to the Emergency Department and triage nurse.  Should you have questions after your visit or need to cancel or reschedule your appointment, please contact [facility] - A DEPT OF [facility]  Dept: [phone]  and follow the prompts.  Office hours are 8:00 a.m. to 4:30 p.m. Monday - Friday. Please note that voicemails left after 4:00 p.m. may not be returned until the following business day.  We are closed weekends and major holidays. You have access to a nurse at all times for urgent questions. Please call the main number to the clinic Dept: [phone] and follow the prompts.   For any non-urgent questions, you may also contact your provider using MyChart. We now offer e-Visits for anyone 61 and older to request care online for non-urgent symptoms. For details visit [email or web address].   Also download the MyChart app! Go to the app store, search "MyChart", open the app, select [place], and log in with your MyChart username and password.

## 2024-05-17 NOTE — Assessment & Plan Note (Addendum)
 The patient have prior diagnosis of smoldering myeloma.  He was referred to Dr. 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 PET/CT imaging was reviewed with the patient and his wife which show no evidence of significant bone involvement/fractures  So far, he tolerated treatment well except for intermittent reflux and high blood sugar on the days that he takes treatment We discussed the role of bone marrow transplant and for now, he is not keen for transplant evaluation Repeat myeloma panel showed positive response to therapy We discussed adding lenalidomide  for deeper response, started in September  Recent myeloma panel from November showed positive control of his disease I recommend complete dexamethasone  taper by end of next month For now, he will continue lenalidomide  21 days on, 7 days off He will get daratumumab  every 28 days He will get Velcade  every other week He will continue taking Eliquis  for DVT prophylaxis He will continue taking calcium , vitamin D  supplement and Zometa  every 3 months, next due in November

## 2024-05-17 NOTE — Progress Notes (Signed)
 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  He was referred to Dr. 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 PET/CT imaging was reviewed with the patient and his wife which show no evidence of significant bone involvement/fractures  So far, he tolerated treatment well except for intermittent reflux and high blood sugar on the days that he takes treatment We discussed the role of bone marrow transplant and for now, he is not keen for transplant evaluation Repeat myeloma panel showed positive response to therapy We discussed adding lenalidomide  for deeper response, started in September  Recent myeloma panel from November showed positive control of his disease I recommend complete dexamethasone  taper by end of next month For now, he will continue lenalidomide  21 days on, 7 days off He will get daratumumab  every 28 days He will get Velcade  every other week He will continue taking Eliquis  for DVT prophylaxis He will continue taking calcium , vitamin D  supplement and Zometa  every 3 months, next due in November Type 2 diabetes mellitus with stage 3 chronic kidney disease, with long-term current use of insulin , unspecified whether stage 3a or 3b CKD (HCC) This is improving with aggressive dietary modification The patient has been diagnosed with diabetes for over 10 years and has been on insulin  for almost 5 years With recent aggressive lifestyle changes, his  blood sugar is improving despite reduced dose insulin   With recent dexamethasone  taper, I recommend discontinuation of insulin  altogether I recommend trial of acarbose  in addition to his current Trulicity  for diabetes management We discussed side effects to be expected CKD stage 3a, GFR 45-59 ml/min (HCC) He is doing well and creatinine is improving We discussed importance of hydration Adenocarcinoma of left lung [facility]) PET/CT imaging from August 2025 showed no evidence of disease recurrence We will continue to monitor his lung once every 6 months, next imaging study will be February 2026  No orders of the defined types were placed in this encounter.    Almarie Bedford, MD  INTERVAL HISTORY: he returns for treatment follow-up Complications related to previous cycle of chemotherapy included anemia, and elevated serum creatinine Overall, he is getting stronger when he increase oral protein intake He is able to walk better He denies the need to take oxycodone  for back pain We reviewed test results and discussed medication changes  PHYSICAL EXAMINATION: ECOG PERFORMANCE STATUS: 1 - Symptomatic but completely ambulatory  No results found for: CAN125    Latest Ref Rng & Units 05/17/2024    9:27 AM 05/03/2024    9:06 AM 04/25/2024    3:57 PM  CBC  WBC 4.0 - 10.5 K/uL 4.4  5.5  5.1   Hemoglobin 13.0 - 17.0 g/dL 89.6  89.3  89.8   Hematocrit 39.0 - 52.0 % 31.1  32.1  30.6   Platelets 150 - 400 K/uL 211  227  220       Chemistry      Component Value Date/Time   NA 137 05/17/2024 0927   NA 137  11/05/2022 1152   K 4.1 05/17/2024 0927   CL 104 05/17/2024 0927   CO2 25 05/17/2024 0927   BUN 20 05/17/2024 0927   BUN 11 11/05/2022 1152   CREATININE 1.28 (H) 05/17/2024 0927      Component Value Date/Time   CALCIUM  9.7 05/17/2024 0927   ALKPHOS 103 05/17/2024 0927   AST 27 05/17/2024 0927   ALT 20 05/17/2024 0927   BILITOT 0.6 05/17/2024 0927       Vitals:   05/17/24 0939  BP:  (!) 140/66  Pulse: 93  Resp: 18  Temp: 98.5 F (36.9 C)  SpO2: 100%   Filed Weights   05/17/24 0939  Weight: 226 lb 14.4 oz (102.9 kg)   Other relevant data reviewed during this visit included CBC, CMP, recent myeloma panel

## 2024-05-18 ENCOUNTER — Telehealth: Payer: Self-pay

## 2024-05-18 ENCOUNTER — Other Ambulatory Visit: Payer: Self-pay | Admitting: Internal Medicine

## 2024-05-18 NOTE — Telephone Encounter (Signed)
 The patient reports that Dr. Lonn instructed them to pick up pioglitazone  from the pharmacy. However, upon pickup, they were dispensed acarbose  instead. The patient is concerned and would like clarification on why a different medication was provided.  I recommend initiating a trial of acarbose  in addition to the patient's current Trulicity  regimen for diabetes management, per charting note from the patient's visit with Dr. Lonn on 11/26.

## 2024-05-20 NOTE — Telephone Encounter (Signed)
 See his mychart message. How is he supposed to take the Acarbose ?

## 2024-05-20 NOTE — Telephone Encounter (Signed)
 Acarbose  is safer for him that pioglitazone  I prescribed acarbose 

## 2024-05-24 ENCOUNTER — Telehealth: Payer: Self-pay

## 2024-05-24 DIAGNOSIS — C9000 Multiple myeloma not having achieved remission: Secondary | ICD-10-CM

## 2024-05-24 NOTE — Progress Notes (Signed)
 Complex Care Management Note  Care Guide Note 05/24/2024 Name: William Mcguire MRN: [id number] DOB: Nov 20, 1955  William Mcguire is a 68 y.o. year old male who sees Jarold Medici, MD for primary care. I reached out to [employer] by phone today to offer complex care management services.  [name] was given information about Complex Care Management services today including:   The Complex Care Management services include support from the care team which includes your Nurse Care Manager, Clinical Social Worker, or Pharmacist.  The Complex Care Management team is here to help remove barriers to the health concerns and goals most important to you. Complex Care Management services are voluntary, and the patient may decline or stop services at any time by request to their care team member.   Complex Care Management Consent Status: Patient did not agree to participate in complex care management services at this time.  Follow up plan:  Patient will follow up with PCP.   Encounter Outcome:  Patient Refused  [employer] Agent [facility], [facility] VBCI Assistant Direct Dial: [phone]  Fax: [phone]

## 2024-05-31 ENCOUNTER — Inpatient Hospital Stay: Attending: Hematology and Oncology

## 2024-05-31 ENCOUNTER — Inpatient Hospital Stay

## 2024-05-31 ENCOUNTER — Inpatient Hospital Stay: Admitting: Hematology and Oncology

## 2024-05-31 ENCOUNTER — Encounter: Payer: Self-pay | Admitting: Hematology and Oncology

## 2024-05-31 VITALS — BP 124/54 | HR 98 | Temp 98.4°F | Resp 19 | Wt 218.8 lb

## 2024-05-31 DIAGNOSIS — N1831 Chronic kidney disease, stage 3a: Secondary | ICD-10-CM

## 2024-05-31 DIAGNOSIS — Z5112 Encounter for antineoplastic immunotherapy: Secondary | ICD-10-CM | POA: Insufficient documentation

## 2024-05-31 DIAGNOSIS — C9000 Multiple myeloma not having achieved remission: Secondary | ICD-10-CM | POA: Diagnosis not present

## 2024-05-31 DIAGNOSIS — E1122 Type 2 diabetes mellitus with diabetic chronic kidney disease: Secondary | ICD-10-CM | POA: Insufficient documentation

## 2024-05-31 DIAGNOSIS — K5909 Other constipation: Secondary | ICD-10-CM | POA: Insufficient documentation

## 2024-05-31 DIAGNOSIS — Z794 Long term (current) use of insulin: Secondary | ICD-10-CM

## 2024-05-31 DIAGNOSIS — Z5111 Encounter for antineoplastic chemotherapy: Secondary | ICD-10-CM | POA: Insufficient documentation

## 2024-05-31 LAB — CMP (CANCER CENTER ONLY)
ALT: 14 U/L (ref 0–44)
AST: 20 U/L (ref 15–41)
Albumin: 4.3 g/dL (ref 3.5–5.0)
Alkaline Phosphatase: 103 U/L (ref 38–126)
Anion gap: 11 (ref 5–15)
BUN: 28 mg/dL — ABNORMAL HIGH (ref 8–23)
CO2: 24 mmol/L (ref 22–32)
Calcium: 10.2 mg/dL (ref 8.9–10.3)
Chloride: 101 mmol/L (ref 98–111)
Creatinine: 1.54 mg/dL — ABNORMAL HIGH (ref 0.61–1.24)
GFR, Estimated: 48 mL/min — ABNORMAL LOW (ref >60)
Glucose, Bld: 170 mg/dL — ABNORMAL HIGH (ref 70–99)
Potassium: 4.5 mmol/L (ref 3.5–5.1)
Sodium: 136 mmol/L (ref 135–145)
Total Bilirubin: 0.5 mg/dL (ref 0.0–1.2)
Total Protein: 7.4 g/dL (ref 6.5–8.1)

## 2024-05-31 LAB — CBC WITH DIFFERENTIAL (CANCER CENTER ONLY)
Abs Immature Granulocytes: 0.1 K/uL — ABNORMAL HIGH (ref 0.00–0.07)
Basophils Absolute: 0 K/uL (ref 0.0–0.1)
Basophils Relative: 1 %
Eosinophils Absolute: 0.2 K/uL (ref 0.0–0.5)
Eosinophils Relative: 3 %
HCT: 31.1 % — ABNORMAL LOW (ref 39.0–52.0)
Hemoglobin: 10.3 g/dL — ABNORMAL LOW (ref 13.0–17.0)
Immature Granulocytes: 2 %
Lymphocytes Relative: 16 %
Lymphs Abs: 1 K/uL (ref 0.7–4.0)
MCH: 29.3 pg (ref 26.0–34.0)
MCHC: 33.1 g/dL (ref 30.0–36.0)
MCV: 88.6 fL (ref 80.0–100.0)
Monocytes Absolute: 0.3 K/uL (ref 0.1–1.0)
Monocytes Relative: 5 %
Neutro Abs: 4.8 K/uL (ref 1.7–7.7)
Neutrophils Relative %: 73 %
Platelet Count: 288 K/uL (ref 150–400)
RBC: 3.51 MIL/uL — ABNORMAL LOW (ref 4.22–5.81)
RDW: 13.3 % (ref 11.5–15.5)
Smear Review: NORMAL
WBC Count: 6.4 K/uL (ref 4.0–10.5)
nRBC: 0 % (ref 0.0–0.2)

## 2024-05-31 MED ORDER — ACETAMINOPHEN 325 MG PO TABS
650.0000 mg | ORAL_TABLET | Freq: Once | ORAL | Status: AC
  Administered 2024-05-31: 650 mg via ORAL
  Filled 2024-05-31: qty 2

## 2024-05-31 MED ORDER — BORTEZOMIB CHEMO SQ INJECTION 3.5 MG (2.5MG/ML)
1.3000 mg/m2 | Freq: Once | INTRAMUSCULAR | Status: AC
  Administered 2024-05-31: 3 mg via SUBCUTANEOUS
  Filled 2024-05-31: qty 1.2

## 2024-05-31 MED ORDER — DARATUMUMAB-HYALURONIDASE-FIHJ 1800-30000 MG-UT/15ML ~~LOC~~ SOLN
1800.0000 mg | Freq: Once | SUBCUTANEOUS | Status: AC
  Administered 2024-05-31: 1800 mg via SUBCUTANEOUS
  Filled 2024-05-31: qty 15

## 2024-05-31 MED ORDER — DIPHENHYDRAMINE HCL 25 MG PO CAPS
25.0000 mg | ORAL_CAPSULE | Freq: Once | ORAL | Status: AC
  Administered 2024-05-31: 25 mg via ORAL
  Filled 2024-05-31: qty 1

## 2024-05-31 NOTE — Assessment & Plan Note (Addendum)
 The patient have prior diagnosis of smoldering myeloma.  He was referred to Dr. 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 PET/CT imaging was reviewed with the patient and his wife which show no evidence of significant bone involvement/fractures  So far, he tolerated treatment well except for intermittent reflux and high blood sugar on the days that he takes treatment We discussed the role of bone marrow transplant and for now, he is not keen for transplant evaluation Repeat myeloma panel showed positive response to therapy We discussed adding lenalidomide  for deeper response, started in September  Recent myeloma panel from November showed positive control of his disease I recommend complete dexamethasone  taper by end of next month For now, he will continue lenalidomide  21 days on, 7 days off He will get daratumumab  every 28 days He will get Velcade  every other week He will continue taking Eliquis  for DVT prophylaxis He will continue taking calcium , vitamin D  supplement and Zometa  every 3 months, next due in Feb

## 2024-05-31 NOTE — Assessment & Plan Note (Addendum)
 He has intermittent elevated serum creatinine We will proceed with treatment today without delay We discussed importance of hydration

## 2024-05-31 NOTE — Progress Notes (Signed)
 [facility] OFFICE PROGRESS NOTE  Patient Care Team: William Medici, MD as PCP - General (Internal Medicine) William Duwaine BROCKS, RN as Oncology Nurse Navigator  Assessment & Plan Multiple myeloma not having achieved remission [facility]) The patient have prior diagnosis of smoldering myeloma.  He was referred to Dr. 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 PET/CT imaging was reviewed with the patient and his wife which show no evidence of significant bone involvement/fractures  So far, he tolerated treatment well except for intermittent reflux and high blood sugar on the days that he takes treatment We discussed the role of bone marrow transplant and for now, he is not keen for transplant evaluation Repeat myeloma panel showed positive response to therapy We discussed adding lenalidomide  for deeper response, started in September  Recent myeloma panel from November showed positive control of his disease I recommend complete dexamethasone  taper by end of next month For now, he will continue lenalidomide  21 days on, 7 days off He will get daratumumab  every 28 days He will get Velcade  every other week He will continue taking Eliquis  for DVT prophylaxis He will continue taking calcium , vitamin D  supplement and Zometa  every 3 months, next due in Feb Type 2 diabetes mellitus with stage 3 chronic kidney disease, with long-term current use of insulin , unspecified whether stage 3a or 3b CKD (HCC) This is improving with aggressive dietary modification The patient has been diagnosed with diabetes for over 10 years and has been on insulin  for almost 5 years With recent aggressive lifestyle changes, his  blood sugar is improving despite reduced dose insulin   With recent dexamethasone  taper, I recommend discontinuation of insulin  altogether I recommend trial of acarbose  in addition to his current Trulicity  for diabetes management So far, he tolerated acarbose  without major side effects CKD stage 3a, GFR 45-59 ml/min (HCC) He has intermittent elevated serum creatinine We will proceed with treatment today without delay We discussed importance of hydration Other constipation I recommend daily MiraLAX  and Senokot  No orders of the defined types were placed in this encounter.    William Bedford, MD  INTERVAL HISTORY: he returns for treatment follow-up for multiple myeloma Complications related to previous cycle of chemotherapy included anemia,, constipation,, and elevated serum creatinine The patient thinks he is drinking enough water He is taking MiraLAX  daily and yet constipated for 3 to 4 days  PHYSICAL EXAMINATION: ECOG PERFORMANCE STATUS: 1 - Symptomatic but completely ambulatory  Lab Results  Component Value Date   IGGSERUM 1,190 05/03/2024   TOTALPROTELP 6.8 05/03/2024   MPROTEIN 0.8 (H) 05/03/2024   IFE Comment (A) 05/03/2024   KPAFRELGTCHN 48.6 (H) 05/03/2024   LAMBDASER 9.0 05/03/2024   KAPLAMBRATIO 5.40 (H) 05/03/2024   KAPLAMBRATIO 11.07 (H) 04/05/2024   KAPLAMBRATIO 12.85 (H) 02/29/2024      Latest Ref Rng & Units 05/31/2024   10:33 AM 05/17/2024    9:27 AM 05/03/2024    9:06 AM  CBC  WBC 4.0 - 10.5 K/uL 6.4  4.4  5.5   Hemoglobin 13.0 - 17.0 g/dL 89.6  89.6  89.3   Hematocrit 39.0 - 52.0 % 31.1  31.1  32.1   Platelets 150 - 400 K/uL 288  211  227  Chemistry      Component Value Date/Time   NA 136 05/31/2024 1033   NA 137 11/05/2022 1152   K 4.5 05/31/2024 1033   CL 101 05/31/2024 1033   CO2 24 05/31/2024 1033   BUN 28 (H) 05/31/2024 1033   BUN 11 11/05/2022 1152   CREATININE 1.54 (H) 05/31/2024 1033      Component Value Date/Time   CALCIUM  10.2  05/31/2024 1033   ALKPHOS 103 05/31/2024 1033   AST 20 05/31/2024 1033   ALT 14 05/31/2024 1033   BILITOT 0.5 05/31/2024 1033       There were no vitals filed for this visit. There were no vitals filed for this visit. Other relevant data reviewed during this visit included CBC, CMP

## 2024-05-31 NOTE — Assessment & Plan Note (Addendum)
 I recommend daily MiraLAX  and Senokot

## 2024-05-31 NOTE — Patient Instructions (Signed)
 [facility] - A DEPT OF [facility]  Discharge Instructions: Thank you for choosing [facility] to provide your oncology and hematology care.   If you have a lab appointment with the Cancer Center, please go directly to the Cancer Center and check in at the registration area.   Wear comfortable clothing and clothing appropriate for easy access to any Portacath or PICC line.   We strive to give you quality time with your provider. You may need to reschedule your appointment if you arrive late (15 or more minutes).  Arriving late affects you and other patients whose appointments are after yours.  Also, if you miss three or more appointments without notifying the office, you may be dismissed from the clinic at the provider's discretion.      For prescription refill requests, have your pharmacy contact our office and allow 72 hours for refills to be completed.    Today you received the following chemotherapy and/or immunotherapy agents: bortezomib  SQ (VELCADE )  daratumumab -hyaluronidase -fihj (DARZALEX  FASPRO)    To help prevent nausea and vomiting after your treatment, we encourage you to take your nausea medication as directed.  BELOW ARE SYMPTOMS THAT SHOULD BE REPORTED IMMEDIATELY: *FEVER GREATER THAN 100.4 F (38 C) OR HIGHER *CHILLS OR SWEATING *NAUSEA AND VOMITING THAT IS NOT CONTROLLED WITH YOUR NAUSEA MEDICATION *UNUSUAL SHORTNESS OF BREATH *UNUSUAL BRUISING OR BLEEDING *URINARY PROBLEMS (pain or burning when urinating, or frequent urination) *BOWEL PROBLEMS (unusual diarrhea, constipation, pain near the anus) TENDERNESS IN MOUTH AND THROAT WITH OR WITHOUT PRESENCE OF ULCERS (sore throat, sores in mouth, or a toothache) UNUSUAL RASH, SWELLING OR PAIN  UNUSUAL VAGINAL DISCHARGE OR ITCHING   Items with * indicate a potential emergency and should be followed up as soon as possible or go to the Emergency Department if any problems should  occur.  Please show the CHEMOTHERAPY ALERT CARD or IMMUNOTHERAPY ALERT CARD at check-in to the Emergency Department and triage nurse.  Should you have questions after your visit or need to cancel or reschedule your appointment, please contact [facility] - A DEPT [facility]  Dept: [phone]  and follow the prompts.  Office hours are 8:00 a.m. to 4:30 p.m. Monday - Friday. Please note that voicemails left after 4:00 p.m. may not be returned until the following business day.  We are closed weekends and major holidays. You have access to a nurse at all times for urgent questions. Please call the main number to the clinic Dept: [phone] and follow the prompts.   For any non-urgent questions, you may also contact your provider using MyChart. We now offer e-Visits for anyone 48 and older to request care online for non-urgent symptoms. For details visit [email or web address].   Also download the MyChart app! Go to the app store, search MyChart, open the app, select [place], and log in with your MyChart username and password.

## 2024-05-31 NOTE — Assessment & Plan Note (Addendum)
 This is improving with aggressive dietary modification The patient has been diagnosed with diabetes for over 10 years and has been on insulin  for almost 5 years With recent aggressive lifestyle changes, his blood sugar is improving despite reduced dose insulin   With recent dexamethasone  taper, I recommend discontinuation of insulin  altogether I recommend trial of acarbose  in addition to his current Trulicity  for diabetes management So far, he tolerated acarbose  without major side effects

## 2024-06-01 LAB — KAPPA/LAMBDA LIGHT CHAINS
Kappa free light chain: 48.2 mg/L — ABNORMAL HIGH (ref 3.3–19.4)
Kappa, lambda light chain ratio: 6.79 — ABNORMAL HIGH (ref 0.26–1.65)
Lambda free light chains: 7.1 mg/L (ref 5.7–26.3)

## 2024-06-02 ENCOUNTER — Encounter: Payer: Self-pay | Admitting: Internal Medicine

## 2024-06-02 ENCOUNTER — Ambulatory Visit: Payer: Self-pay | Admitting: Internal Medicine

## 2024-06-02 VITALS — BP 110/74 | HR 81 | Temp 98.3°F | Ht 75.0 in | Wt 220.6 lb

## 2024-06-02 DIAGNOSIS — I129 Hypertensive chronic kidney disease with stage 1 through stage 4 chronic kidney disease, or unspecified chronic kidney disease: Secondary | ICD-10-CM | POA: Diagnosis not present

## 2024-06-02 DIAGNOSIS — E11649 Type 2 diabetes mellitus with hypoglycemia without coma: Secondary | ICD-10-CM

## 2024-06-02 DIAGNOSIS — N182 Chronic kidney disease, stage 2 (mild): Secondary | ICD-10-CM

## 2024-06-02 DIAGNOSIS — Z6827 Body mass index (BMI) 27.0-27.9, adult: Secondary | ICD-10-CM | POA: Diagnosis not present

## 2024-06-02 DIAGNOSIS — C9000 Multiple myeloma not having achieved remission: Secondary | ICD-10-CM

## 2024-06-02 DIAGNOSIS — E1122 Type 2 diabetes mellitus with diabetic chronic kidney disease: Secondary | ICD-10-CM

## 2024-06-02 DIAGNOSIS — R251 Tremor, unspecified: Secondary | ICD-10-CM | POA: Diagnosis not present

## 2024-06-02 DIAGNOSIS — K5909 Other constipation: Secondary | ICD-10-CM | POA: Diagnosis not present

## 2024-06-02 DIAGNOSIS — Z794 Long term (current) use of insulin: Secondary | ICD-10-CM | POA: Diagnosis not present

## 2024-06-02 NOTE — Progress Notes (Unsigned)
 I,Victoria T Emmitt, CMA,acting as a neurosurgeon for William LOISE Slocumb, MD.,have documented all relevant documentation on the behalf of William LOISE Slocumb, MD,as directed by  William LOISE Slocumb, MD while in the presence of William LOISE Slocumb, MD.  Subjective:  Patient ID: William Mcguire , male    DOB: 07-Dec-1955 , 68 y.o.   MRN: [id number]  Chief Complaint  Patient presents with   Diabetes    Patient presents today for dm & bpc. He is accompanied by his son. He reports compliance with medications. Denies headache, chest pain & sob. He has no specific questions or concerns.    Hypertension    HPI Discussed the use of AI scribe software for clinical note transcription with the patient, who gave verbal consent to proceed.  History of Present Illness William Mcguire is a 68 year old male with diabetes and chronic kidney disease who presents for a diabetes check. He is accompanied by his son.  He is using a Dexcom to monitor his blood sugars, which have been stable, though he has experienced nocturnal hypoglycemia with readings as low as 67 mg/dL despite having eaten dinner. He is currently taking acarbose  once daily, although it is prescribed for three times a day. He has discontinued insulin  and prednisone. He is also on atorvastatin  for cholesterol management.  He has started home physical therapy due to weakness and reports feeling stronger. He is performing exercises both during and outside of therapy sessions.  He experiences constipation, which is being managed with daily Miralax  and Senokot. The stool is not hard but difficult to pass, and he is also taking a stool softener.  He experiences reflux on treatment days and is taking omeprazole  for management. The reflux lasts a couple of days after treatment.  He has developed a tremor, which his son and wife have noticed. The tremor occurs occasionally, such as after physical activity, but he does not feel unsteady.  He is eating better, focusing on  protein intake through meat and homemade protein shakes. He denies diarrhea from the protein shakes but reports feeling bloated and that food 'just don't move.'   HPI   Past Medical History:  Diagnosis Date   Diabetes mellitus    High cholesterol    Hypertension      Family History  Problem Relation Age of Onset   Hypertension Mother    Multiple myeloma Mother    Hypertension Father    Diabetes Father     Current Medications[1]   Allergies[2]   Review of Systems  Respiratory: Negative.    Cardiovascular: Negative.   Gastrointestinal: Negative.   Skin: Negative.   Allergic/Immunologic: Negative.   Hematological: Negative.      Today's Vitals   06/02/24 0850  BP: 110/74  Pulse: 81  Temp: 98.3 F (36.8 C)  SpO2: 98%  Weight: 220 lb 9.6 oz (100.1 kg)  Height: 6' 3 (1.905 m)   Body mass index is 27.57 kg/m.  Wt Readings from Last 3 Encounters:  06/02/24 220 lb 9.6 oz (100.1 kg)  05/31/24 218 lb 12 oz (99.2 kg)  05/17/24 226 lb 14.4 oz (102.9 kg)    The 10-year ASCVD risk score (Arnett DK, et al., 2019) is: 24.5%   Values used to calculate the score:     Age: 55 years     Clinically relevant sex: Male     Is Non-Hispanic African American: Yes     Diabetic: Yes     Tobacco smoker: No  Systolic Blood Pressure: 110 mmHg     Is BP treated: Yes     HDL Cholesterol: 43 mg/dL     Total Cholesterol: 137 mg/dL  Objective:  Physical Exam Vitals and nursing note reviewed.  Constitutional:      Appearance: Normal appearance.  HENT:     Head: Normocephalic and atraumatic.  Eyes:     Extraocular Movements: Extraocular movements intact.  Cardiovascular:     Rate and Rhythm: Normal rate and regular rhythm.     Heart sounds: Normal heart sounds.  Pulmonary:     Effort: Pulmonary effort is normal.     Breath sounds: Normal breath sounds.  Musculoskeletal:     Cervical back: Normal range of motion.  Skin:    General: Skin is warm.  Neurological:      General: No focal deficit present.     Mental Status: He is alert.  Psychiatric:        Mood and Affect: Mood normal.         Assessment And Plan:   Assessment & Plan Type 2 diabetes mellitus with stage 2 chronic kidney disease, with long-term current use of insulin  (HCC)  Hypoglycemia associated with diabetes (HCC)  Hypertensive nephropathy  Multiple myeloma not having achieved remission (HCC)   Orders Placed This Encounter  Procedures   Lipid panel   Hemoglobin A1c     Return for 4 MONTH DM F/U.SABRA  Patient was given opportunity to ask questions. Patient verbalized understanding of the plan and was able to repeat key elements of the plan. All questions were answered to their satisfaction.    I, William LOISE Slocumb, MD, have reviewed all documentation for this visit. The documentation on 06/02/2024 for the exam, diagnosis, procedures, and orders are all accurate and complete.   IF YOU HAVE BEEN REFERRED TO A SPECIALIST, IT MAY TAKE 1-2 WEEKS TO SCHEDULE/PROCESS THE REFERRAL. IF YOU HAVE NOT HEARD FROM US /SPECIALIST IN TWO WEEKS, PLEASE GIVE US  A CALL AT [phone] X [phone].      [1]  Current Outpatient Medications:    acarbose  (PRECOSE ) 25 MG tablet, Take 1 tablet (25 mg total) by mouth 3 (three) times daily with meals., Disp: 90 tablet, Rfl: 1   acyclovir  (ZOVIRAX ) 400 MG tablet, Take 1 tablet (400 mg total) by mouth daily., Disp: 90 tablet, Rfl: 2   atorvastatin  (LIPITOR) 40 MG tablet, TAKE 1 TABLET BY MOUTH EVERY DAY, Disp: 90 tablet, Rfl: 1   calcium  carbonate (TUMS EX) 750 MG chewable tablet, Chew 1 tablet by mouth daily., Disp: , Rfl:    Continuous Glucose Sensor (DEXCOM G7 SENSOR) MISC, USE AS DIRECTED TO CHECK BLOOD SUGARS. CHANGE EVERY 10 DAYS, Disp: 3 each, Rfl: 3   cyanocobalamin  (VITAMIN B12) 1000 MCG/ML injection, Inject 1 mL (1,000 mcg total) into the muscle every 30 (thirty) days., Disp: 9 mL, Rfl: 1   Dulaglutide  (TRULICITY ) 1.5 MG/0.5ML SOAJ, Inject 1.5 mg into  the skin every Sunday., Disp: , Rfl:    ELIQUIS  5 MG TABS tablet, TAKE 1 TABLET BY MOUTH TWICE A DAY, Disp: 60 tablet, Rfl: 2   EMBECTA PEN NEEDLE ULTRAFINE 31G X 8 MM MISC, AS DIRECTED FOR USE WITH INSULIN  PEN, Disp: 100 each, Rfl: 1   enalapril  (VASOTEC ) 10 MG tablet, TAKE 1 TABLET BY MOUTH EVERY DAY, Disp: 90 tablet, Rfl: 0   glucose blood test strip, Use as instructed to test blood sugar 3 times a day. Dx code: e11.65, Disp: 100 each, Rfl: 2  lenalidomide  (REVLIMID ) 5 MG capsule, TAKE 1 CAPSULE BY MOUTH 1 TIME A DAY FOR 21 DAYS ON THEN 7 DAYS OFF, Disp: 21 capsule, Rfl: 0   omeprazole  (PRILOSEC) 40 MG capsule, TAKE 1 CAPSULE (40 MG TOTAL) BY MOUTH DAILY., Disp: 90 capsule, Rfl: 1   ondansetron  (ZOFRAN ) 8 MG tablet, Take 8 mg by mouth every 8 (eight) hours as needed for nausea or vomiting., Disp: , Rfl:    ONETOUCH VERIO test strip, USE AS INSTRUCTED TO CHECK BLOOD SUGARS TWICE DAILY E11.69, Disp: 200 strip, Rfl: 1   oxyCODONE  10 MG TABS, Take 1 tablet (10 mg total) by mouth every 4 (four) hours as needed for severe pain (pain score 7-10)., Disp: 60 tablet, Rfl: 0   polyethylene glycol (MIRALAX  / GLYCOLAX ) 17 g packet, Take 17 g by mouth daily., Disp: , Rfl:    prochlorperazine  (COMPAZINE ) 10 MG tablet, Take 10 mg by mouth every 6 (six) hours as needed for nausea or vomiting., Disp: , Rfl:    senna (SENOKOT) 8.6 MG tablet, Take 2 tablets by mouth 2 (two) times daily., Disp: , Rfl:    sildenafil  (VIAGRA ) 100 MG tablet, Take 1 tablet (100 mg total) by mouth as needed for erectile dysfunction. TAKE 1 TABLET BY MOUTH EVERY DAY prn, Disp: 10 tablet, Rfl: 2   SYRINGE-NEEDLE, DISP, 3 ML (B-D INTEGRA SYRINGE) 25G X 5/8 3 ML MISC, USE AS DIRECTED, Disp: 12 each, Rfl: 24 [2] No Known Allergies

## 2024-06-02 NOTE — Patient Instructions (Signed)

## 2024-06-03 LAB — LIPID PANEL
Chol/HDL Ratio: 2.6 ratio (ref 0.0–5.0)
Cholesterol, Total: 112 mg/dL (ref 100–199)
HDL: 43 mg/dL (ref >39)
LDL Chol Calc (NIH): 54 mg/dL (ref 0–99)
Triglycerides: 75 mg/dL (ref 0–149)
VLDL Cholesterol Cal: 15 mg/dL (ref 5–40)

## 2024-06-03 LAB — HEMOGLOBIN A1C
Est. average glucose Bld gHb Est-mCnc: 140 mg/dL
Hgb A1c MFr Bld: 6.5 % — ABNORMAL HIGH (ref 4.8–5.6)

## 2024-06-03 LAB — MULTIPLE MYELOMA PANEL, SERUM
Albumin SerPl Elph-Mcnc: 3.6 g/dL (ref 2.9–4.4)
Albumin/Glob SerPl: 1.2 (ref 0.7–1.7)
Alpha 1: 0.3 g/dL (ref 0.0–0.4)
Alpha2 Glob SerPl Elph-Mcnc: 1.1 g/dL — ABNORMAL HIGH (ref 0.4–1.0)
B-Globulin SerPl Elph-Mcnc: 0.9 g/dL (ref 0.7–1.3)
Gamma Glob SerPl Elph-Mcnc: 1 g/dL (ref 0.4–1.8)
Globulin, Total: 3.2 g/dL (ref 2.2–3.9)
IgA: 35 mg/dL — ABNORMAL LOW (ref 61–437)
IgG (Immunoglobin G), Serum: 1145 mg/dL (ref 603–1613)
IgM (Immunoglobulin M), Srm: 27 mg/dL (ref 20–172)
M Protein SerPl Elph-Mcnc: 0.8 g/dL — ABNORMAL HIGH
Total Protein ELP: 6.8 g/dL (ref 6.0–8.5)

## 2024-06-04 ENCOUNTER — Ambulatory Visit: Payer: Self-pay | Admitting: Internal Medicine

## 2024-06-08 ENCOUNTER — Other Ambulatory Visit: Payer: Self-pay | Admitting: Hematology and Oncology

## 2024-06-08 ENCOUNTER — Telehealth: Payer: Self-pay | Admitting: Pharmacist

## 2024-06-08 ENCOUNTER — Telehealth: Payer: Self-pay

## 2024-06-08 DIAGNOSIS — E1122 Type 2 diabetes mellitus with diabetic chronic kidney disease: Secondary | ICD-10-CM

## 2024-06-08 NOTE — Progress Notes (Signed)
 Patient ID: William Mcguire, male   DOB: January 19, 1956, 68 y.o.   MRN: [id number]  Patient left signed medication assistance forms from [employer] [facility] for Trulicity  at the office.  Upon review, the yearly income listed on the application is above the cut off listed on the application.  In addition, the Patient did not check off having a Medicare Advantage plan.    Patient was called. HIPAA identifiers were obtained.  Patient confirmed the household income written on the application. It was explained to him that what he listed is over the income limit on the application but that we will go ahead and send the application to the company for final determination.   He communicated understanding.    Lab Results  Component Value Date   HGBA1C 6.5 (H) 06/02/2024   HGBA1C 6.2 (H) 01/28/2024   HGBA1C 5.8 (H) 10/28/2023   Application was faxed to Luke Mall, CPhT for scanning and processing.   [name], PharmD, BCACP Clinical Pharmacist [phone]

## 2024-06-08 NOTE — Telephone Encounter (Signed)
 PAP: Patient assistance application for Trulicity through [employer] has been mailed to pt's home address on file. Provider portion of application will be faxed to provider's office.

## 2024-06-08 NOTE — Telephone Encounter (Signed)
 Received Patient portion PAP application Trulicity  (lilly)

## 2024-06-08 NOTE — Telephone Encounter (Signed)
 Per office visit with Patient [name]  has been discontinued.  PAP application not needed.

## 2024-06-10 ENCOUNTER — Other Ambulatory Visit: Payer: Self-pay | Admitting: Hematology and Oncology

## 2024-06-10 DIAGNOSIS — C9000 Multiple myeloma not having achieved remission: Secondary | ICD-10-CM

## 2024-06-10 NOTE — Telephone Encounter (Signed)
Pls refill electronically °

## 2024-06-12 NOTE — Assessment & Plan Note (Signed)
 Chronic, well controlled.  He is encouraged to follow low sodium diet. Amlodipine  has been discontinued - Continue with enalapril  10mg  daily

## 2024-06-12 NOTE — Assessment & Plan Note (Signed)
 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 - Continue current treatment regimen.

## 2024-06-12 NOTE — Assessment & Plan Note (Signed)
 Chronic.  Blood sugars controlled, recent hypoglycemia episodes. CKD stage 3A, stable function. - Continue acarbose  once daily. - Monitor blood sugars regularly. - Ensure adequate hydration.

## 2024-06-14 ENCOUNTER — Inpatient Hospital Stay

## 2024-06-14 VITALS — BP 122/63 | HR 98 | Temp 98.5°F | Resp 18 | Wt 220.5 lb

## 2024-06-14 DIAGNOSIS — C9000 Multiple myeloma not having achieved remission: Secondary | ICD-10-CM

## 2024-06-14 DIAGNOSIS — Z5111 Encounter for antineoplastic chemotherapy: Secondary | ICD-10-CM | POA: Diagnosis not present

## 2024-06-14 LAB — CMP (CANCER CENTER ONLY)
ALT: 28 U/L (ref 0–44)
AST: 62 U/L — ABNORMAL HIGH (ref 15–41)
Albumin: 4.1 g/dL (ref 3.5–5.0)
Alkaline Phosphatase: 74 U/L (ref 38–126)
Anion gap: 11 (ref 5–15)
BUN: 30 mg/dL — ABNORMAL HIGH (ref 8–23)
CO2: 23 mmol/L (ref 22–32)
Calcium: 8.8 mg/dL — ABNORMAL LOW (ref 8.9–10.3)
Chloride: 100 mmol/L (ref 98–111)
Creatinine: 1.73 mg/dL — ABNORMAL HIGH (ref 0.61–1.24)
GFR, Estimated: 42 mL/min — ABNORMAL LOW
Glucose, Bld: 117 mg/dL — ABNORMAL HIGH (ref 70–99)
Potassium: 4.4 mmol/L (ref 3.5–5.1)
Sodium: 133 mmol/L — ABNORMAL LOW (ref 135–145)
Total Bilirubin: 0.6 mg/dL (ref 0.0–1.2)
Total Protein: 6.6 g/dL (ref 6.5–8.1)

## 2024-06-14 LAB — CBC WITH DIFFERENTIAL (CANCER CENTER ONLY)
Abs Immature Granulocytes: 0.03 K/uL (ref 0.00–0.07)
Basophils Absolute: 0 K/uL (ref 0.0–0.1)
Basophils Relative: 0 %
Eosinophils Absolute: 0.5 K/uL (ref 0.0–0.5)
Eosinophils Relative: 8 %
HCT: 28.7 % — ABNORMAL LOW (ref 39.0–52.0)
Hemoglobin: 9.4 g/dL — ABNORMAL LOW (ref 13.0–17.0)
Immature Granulocytes: 1 %
Lymphocytes Relative: 8 %
Lymphs Abs: 0.5 K/uL — ABNORMAL LOW (ref 0.7–4.0)
MCH: 28.7 pg (ref 26.0–34.0)
MCHC: 32.8 g/dL (ref 30.0–36.0)
MCV: 87.8 fL (ref 80.0–100.0)
Monocytes Absolute: 0.4 K/uL (ref 0.1–1.0)
Monocytes Relative: 6 %
Neutro Abs: 5.1 K/uL (ref 1.7–7.7)
Neutrophils Relative %: 77 %
Platelet Count: 171 K/uL (ref 150–400)
RBC: 3.27 MIL/uL — ABNORMAL LOW (ref 4.22–5.81)
RDW: 13.6 % (ref 11.5–15.5)
WBC Count: 6.5 K/uL (ref 4.0–10.5)
nRBC: 0 % (ref 0.0–0.2)

## 2024-06-14 MED ORDER — PROCHLORPERAZINE MALEATE 10 MG PO TABS
10.0000 mg | ORAL_TABLET | Freq: Once | ORAL | Status: AC
  Administered 2024-06-14: 10 mg via ORAL
  Filled 2024-06-14: qty 1

## 2024-06-14 MED ORDER — BORTEZOMIB CHEMO SQ INJECTION 3.5 MG (2.5MG/ML)
1.3000 mg/m2 | Freq: Once | INTRAMUSCULAR | Status: AC
  Administered 2024-06-14: 3 mg via SUBCUTANEOUS
  Filled 2024-06-14: qty 1.2

## 2024-06-14 NOTE — Patient Instructions (Signed)
 [facility] - A DEPT OF . [facility]  Discharge Instructions: Thank you for choosing [facility] to provide your oncology and hematology care.   If you have a lab appointment with the Cancer Center, please go directly to the Cancer Center and check in at the registration area.   Wear comfortable clothing and clothing appropriate for easy access to any Portacath or PICC line.   We strive to give you quality time with your provider. You may need to reschedule your appointment if you arrive late (15 or more minutes).  Arriving late affects you and other patients whose appointments are after yours.  Also, if you miss three or more appointments without notifying the office, you may be dismissed from the clinic at the provider's discretion.      For prescription refill requests, have your pharmacy contact our office and allow 72 hours for refills to be completed.    Today you received the following chemotherapy and/or immunotherapy agents: bortezomib  SQ (VELCADE )    To help prevent nausea and vomiting after your treatment, we encourage you to take your nausea medication as directed.  BELOW ARE SYMPTOMS THAT SHOULD BE REPORTED IMMEDIATELY: *FEVER GREATER THAN 100.4 F (38 C) OR HIGHER *CHILLS OR SWEATING *NAUSEA AND VOMITING THAT IS NOT CONTROLLED WITH YOUR NAUSEA MEDICATION *UNUSUAL SHORTNESS OF BREATH *UNUSUAL BRUISING OR BLEEDING *URINARY PROBLEMS (pain or burning when urinating, or frequent urination) *BOWEL PROBLEMS (unusual diarrhea, constipation, pain near the anus) TENDERNESS IN MOUTH AND THROAT WITH OR WITHOUT PRESENCE OF ULCERS (sore throat, sores in mouth, or a toothache) UNUSUAL RASH, SWELLING OR PAIN  UNUSUAL VAGINAL DISCHARGE OR ITCHING   Items with * indicate a potential emergency and should be followed up as soon as possible or go to the Emergency Department if any problems should occur.  Please show the CHEMOTHERAPY ALERT CARD or  IMMUNOTHERAPY ALERT CARD at check-in to the Emergency Department and triage nurse.  Should you have questions after your visit or need to cancel or reschedule your appointment, please contact [facility] - A DEPT OF [facility]  Dept: [phone]  and follow the prompts.  Office hours are 8:00 a.m. to 4:30 p.m. Monday - Friday. Please note that voicemails left after 4:00 p.m. may not be returned until the following business day.  We are closed weekends and major holidays. You have access to a nurse at all times for urgent questions. Please call the main number to the clinic Dept: [phone] and follow the prompts.   For any non-urgent questions, you may also contact your provider using MyChart. We now offer e-Visits for anyone 61 and older to request care online for non-urgent symptoms. For details visit [email or web address].   Also download the MyChart app! Go to the app store, search "MyChart", open the app, select [place], and log in with your MyChart username and password.

## 2024-06-20 ENCOUNTER — Telehealth: Payer: Self-pay

## 2024-06-20 NOTE — Telephone Encounter (Signed)
 Copied from CRM #[id number]. Topic: General - Other >> Jun 20, 2024 12:44 PM William Mcguire wrote: Reason for CRM: Nat from [facility] home health calling to report that the patient had a fall on 12/20. He stated he is not injured and was able to get up by himself. He also stated that he was doing a lot that day and his knees buckled.

## 2024-06-21 ENCOUNTER — Other Ambulatory Visit: Payer: Self-pay | Admitting: Hematology and Oncology

## 2024-06-21 DIAGNOSIS — I8290 Acute embolism and thrombosis of unspecified vein: Secondary | ICD-10-CM

## 2024-06-21 DIAGNOSIS — I2699 Other pulmonary embolism without acute cor pulmonale: Secondary | ICD-10-CM

## 2024-06-24 ENCOUNTER — Inpatient Hospital Stay

## 2024-06-28 ENCOUNTER — Inpatient Hospital Stay

## 2024-06-28 ENCOUNTER — Inpatient Hospital Stay: Attending: Hematology and Oncology

## 2024-06-28 VITALS — BP 133/66 | HR 73 | Temp 98.3°F | Resp 20 | Ht 75.0 in | Wt 217.8 lb

## 2024-06-28 DIAGNOSIS — Z5112 Encounter for antineoplastic immunotherapy: Secondary | ICD-10-CM | POA: Insufficient documentation

## 2024-06-28 DIAGNOSIS — C9000 Multiple myeloma not having achieved remission: Secondary | ICD-10-CM | POA: Diagnosis not present

## 2024-06-28 DIAGNOSIS — N1831 Chronic kidney disease, stage 3a: Secondary | ICD-10-CM | POA: Insufficient documentation

## 2024-06-28 DIAGNOSIS — Z5111 Encounter for antineoplastic chemotherapy: Secondary | ICD-10-CM | POA: Insufficient documentation

## 2024-06-28 LAB — CBC WITH DIFFERENTIAL (CANCER CENTER ONLY)
Abs Immature Granulocytes: 0.17 K/uL — ABNORMAL HIGH (ref 0.00–0.07)
Basophils Absolute: 0 K/uL (ref 0.0–0.1)
Basophils Relative: 0 %
Eosinophils Absolute: 0.3 K/uL (ref 0.0–0.5)
Eosinophils Relative: 5 %
HCT: 28.1 % — ABNORMAL LOW (ref 39.0–52.0)
Hemoglobin: 9.4 g/dL — ABNORMAL LOW (ref 13.0–17.0)
Immature Granulocytes: 2 %
Lymphocytes Relative: 24 %
Lymphs Abs: 1.7 K/uL (ref 0.7–4.0)
MCH: 29.5 pg (ref 26.0–34.0)
MCHC: 33.5 g/dL (ref 30.0–36.0)
MCV: 88.1 fL (ref 80.0–100.0)
Monocytes Absolute: 0.4 K/uL (ref 0.1–1.0)
Monocytes Relative: 6 %
Neutro Abs: 4.5 K/uL (ref 1.7–7.7)
Neutrophils Relative %: 63 %
Platelet Count: 303 K/uL (ref 150–400)
RBC: 3.19 MIL/uL — ABNORMAL LOW (ref 4.22–5.81)
RDW: 14.2 % (ref 11.5–15.5)
WBC Count: 7.1 K/uL (ref 4.0–10.5)
nRBC: 0 % (ref 0.0–0.2)

## 2024-06-28 LAB — CMP (CANCER CENTER ONLY)
ALT: 21 U/L (ref 0–44)
AST: 20 U/L (ref 15–41)
Albumin: 4.2 g/dL (ref 3.5–5.0)
Alkaline Phosphatase: 100 U/L (ref 38–126)
Anion gap: 8 (ref 5–15)
BUN: 15 mg/dL (ref 8–23)
CO2: 25 mmol/L (ref 22–32)
Calcium: 9.5 mg/dL (ref 8.9–10.3)
Chloride: 102 mmol/L (ref 98–111)
Creatinine: 1.26 mg/dL — ABNORMAL HIGH (ref 0.61–1.24)
GFR, Estimated: >60 mL/min
Glucose, Bld: 108 mg/dL — ABNORMAL HIGH (ref 70–99)
Potassium: 4.2 mmol/L (ref 3.5–5.1)
Sodium: 135 mmol/L (ref 135–145)
Total Bilirubin: 0.3 mg/dL (ref 0.0–1.2)
Total Protein: 6.8 g/dL (ref 6.5–8.1)

## 2024-06-28 MED ORDER — DIPHENHYDRAMINE HCL 25 MG PO CAPS
25.0000 mg | ORAL_CAPSULE | Freq: Once | ORAL | Status: AC
  Administered 2024-06-28: 25 mg via ORAL
  Filled 2024-06-28: qty 1

## 2024-06-28 MED ORDER — BORTEZOMIB CHEMO SQ INJECTION 3.5 MG (2.5MG/ML)
1.3000 mg/m2 | Freq: Once | INTRAMUSCULAR | Status: AC
  Administered 2024-06-28: 3 mg via SUBCUTANEOUS
  Filled 2024-06-28: qty 1.2

## 2024-06-28 MED ORDER — ACETAMINOPHEN 325 MG PO TABS
650.0000 mg | ORAL_TABLET | Freq: Once | ORAL | Status: AC
  Administered 2024-06-28: 650 mg via ORAL
  Filled 2024-06-28: qty 2

## 2024-06-28 MED ORDER — DARATUMUMAB-HYALURONIDASE-FIHJ 1800-30000 MG-UT/15ML ~~LOC~~ SOLN
1800.0000 mg | Freq: Once | SUBCUTANEOUS | Status: AC
  Administered 2024-06-28: 1800 mg via SUBCUTANEOUS
  Filled 2024-06-28: qty 15

## 2024-06-28 NOTE — Patient Instructions (Signed)
 [facility] OF [facility]  Discharge Instructions: Thank you for choosing [facility] to provide your oncology and hematology care.   If you have a lab appointment with the Cancer Center, please go directly to the Cancer Center and check in at the registration area.   Wear comfortable clothing and clothing appropriate for easy access to any Portacath or PICC line.   We strive to give you quality time with your provider. You may need to reschedule your appointment if you arrive late (15 or more minutes).  Arriving late affects you and other patients whose appointments are after yours.  Also, if you miss three or more appointments without notifying the office, you may be dismissed from the clinic at the provider's discretion.      For prescription refill requests, have your pharmacy contact our office and allow 72 hours for refills to be completed.    Today you received the following chemotherapy and/or immunotherapy agents: Velcade/Darzalex Faspro      To help prevent nausea and vomiting after your treatment, we encourage you to take your nausea medication as directed.  BELOW ARE SYMPTOMS THAT SHOULD BE REPORTED IMMEDIATELY: *FEVER GREATER THAN 100.4 F (38 C) OR HIGHER *CHILLS OR SWEATING *NAUSEA AND VOMITING THAT IS NOT CONTROLLED WITH YOUR NAUSEA MEDICATION *UNUSUAL SHORTNESS OF BREATH *UNUSUAL BRUISING OR BLEEDING *URINARY PROBLEMS (pain or burning when urinating, or frequent urination) *BOWEL PROBLEMS (unusual diarrhea, constipation, pain near the anus) TENDERNESS IN MOUTH AND THROAT WITH OR WITHOUT PRESENCE OF ULCERS (sore throat, sores in mouth, or a toothache) UNUSUAL RASH, SWELLING OR PAIN  UNUSUAL VAGINAL DISCHARGE OR ITCHING   Items with * indicate a potential emergency and should be followed up as soon as possible or go to the Emergency Department if any problems should occur.  Please show the CHEMOTHERAPY ALERT CARD or  IMMUNOTHERAPY ALERT CARD at check-in to the Emergency Department and triage nurse.  Should you have questions after your visit or need to cancel or reschedule your appointment, please contact [facility] DEPT [facility]  Dept: [phone]  and follow the prompts.  Office hours are 8:00 a.m. to 4:30 p.m. Monday - Friday. Please note that voicemails left after 4:00 p.m. may not be returned until the following business day.  We are closed weekends and major holidays. You have access to a nurse at all times for urgent questions. Please call the main number to the clinic Dept: [phone] and follow the prompts.   For any non-urgent questions, you may also contact your provider using MyChart. We now offer e-Visits for anyone 25 and older to request care online for non-urgent symptoms. For details visit [email or web address].   Also download the MyChart app! Go to the app store, search "MyChart", open the app, select [place], and log in with your MyChart username and password.

## 2024-06-29 ENCOUNTER — Encounter: Payer: Self-pay | Admitting: Pharmacist

## 2024-06-29 NOTE — Progress Notes (Signed)
" ° °  06/29/2024 Name: William Mcguire MRN: [id number] DOB: 1956-06-13  Chief Complaint  Patient presents with   Medication Assistance   Patient Assistance Application for Trulicity  completed and Provider signature obtained [facility]). Application will be faxed to Luke Mall, CPhT for scanning and processing.  Lab Results  Component Value Date   HGBA1C 6.5 (H) 06/02/2024   HGBA1C 6.2 (H) 01/28/2024   HGBA1C 5.8 (H) 10/28/2023     [name], PharmD, BCACP Clinical Pharmacist [phone]  "

## 2024-06-30 NOTE — Telephone Encounter (Signed)
 PAP: Application for Trulicity has been submitted to [employer], via fax

## 2024-07-10 ENCOUNTER — Other Ambulatory Visit: Payer: Self-pay | Admitting: Hematology and Oncology

## 2024-07-11 ENCOUNTER — Other Ambulatory Visit: Payer: Self-pay | Admitting: Hematology and Oncology

## 2024-07-11 DIAGNOSIS — C9000 Multiple myeloma not having achieved remission: Secondary | ICD-10-CM

## 2024-07-12 ENCOUNTER — Inpatient Hospital Stay

## 2024-07-12 ENCOUNTER — Inpatient Hospital Stay: Admitting: Hematology and Oncology

## 2024-07-12 ENCOUNTER — Encounter: Payer: Self-pay | Admitting: Hematology and Oncology

## 2024-07-12 ENCOUNTER — Telehealth: Payer: Self-pay | Admitting: Hematology and Oncology

## 2024-07-12 VITALS — BP 126/48 | HR 79 | Resp 18

## 2024-07-12 VITALS — BP 128/55 | HR 77 | Temp 98.1°F | Resp 18 | Ht 75.0 in | Wt 221.0 lb

## 2024-07-12 DIAGNOSIS — C9000 Multiple myeloma not having achieved remission: Secondary | ICD-10-CM

## 2024-07-12 DIAGNOSIS — Z5111 Encounter for antineoplastic chemotherapy: Secondary | ICD-10-CM | POA: Diagnosis not present

## 2024-07-12 DIAGNOSIS — N1831 Chronic kidney disease, stage 3a: Secondary | ICD-10-CM | POA: Diagnosis not present

## 2024-07-12 DIAGNOSIS — D539 Nutritional anemia, unspecified: Secondary | ICD-10-CM

## 2024-07-12 LAB — CMP (CANCER CENTER ONLY)
ALT: 13 U/L (ref 0–44)
AST: 19 U/L (ref 15–41)
Albumin: 4.4 g/dL (ref 3.5–5.0)
Alkaline Phosphatase: 92 U/L (ref 38–126)
Anion gap: 11 (ref 5–15)
BUN: 19 mg/dL (ref 8–23)
CO2: 25 mmol/L (ref 22–32)
Calcium: 9.6 mg/dL (ref 8.9–10.3)
Chloride: 102 mmol/L (ref 98–111)
Creatinine: 1.3 mg/dL — ABNORMAL HIGH (ref 0.61–1.24)
GFR, Estimated: 59 mL/min — ABNORMAL LOW
Glucose, Bld: 114 mg/dL — ABNORMAL HIGH (ref 70–99)
Potassium: 4.1 mmol/L (ref 3.5–5.1)
Sodium: 138 mmol/L (ref 135–145)
Total Bilirubin: 0.4 mg/dL (ref 0.0–1.2)
Total Protein: 7 g/dL (ref 6.5–8.1)

## 2024-07-12 LAB — CBC WITH DIFFERENTIAL (CANCER CENTER ONLY)
Abs Immature Granulocytes: 0.02 K/uL (ref 0.00–0.07)
Basophils Absolute: 0 K/uL (ref 0.0–0.1)
Basophils Relative: 0 %
Eosinophils Absolute: 0.5 K/uL (ref 0.0–0.5)
Eosinophils Relative: 9 %
HCT: 30 % — ABNORMAL LOW (ref 39.0–52.0)
Hemoglobin: 9.9 g/dL — ABNORMAL LOW (ref 13.0–17.0)
Immature Granulocytes: 0 %
Lymphocytes Relative: 21 %
Lymphs Abs: 1.1 K/uL (ref 0.7–4.0)
MCH: 28.9 pg (ref 26.0–34.0)
MCHC: 33 g/dL (ref 30.0–36.0)
MCV: 87.7 fL (ref 80.0–100.0)
Monocytes Absolute: 0.7 K/uL (ref 0.1–1.0)
Monocytes Relative: 12 %
Neutro Abs: 3.1 K/uL (ref 1.7–7.7)
Neutrophils Relative %: 58 %
Platelet Count: 152 K/uL (ref 150–400)
RBC: 3.42 MIL/uL — ABNORMAL LOW (ref 4.22–5.81)
RDW: 14.2 % (ref 11.5–15.5)
WBC Count: 5.4 K/uL (ref 4.0–10.5)
nRBC: 0 % (ref 0.0–0.2)

## 2024-07-12 MED ORDER — PROCHLORPERAZINE MALEATE 10 MG PO TABS
10.0000 mg | ORAL_TABLET | Freq: Once | ORAL | Status: AC
  Administered 2024-07-12: 10 mg via ORAL
  Filled 2024-07-12: qty 1

## 2024-07-12 MED ORDER — BORTEZOMIB CHEMO SQ INJECTION 3.5 MG (2.5MG/ML)
1.3000 mg/m2 | Freq: Once | INTRAMUSCULAR | Status: AC
  Administered 2024-07-12: 3 mg via SUBCUTANEOUS
  Filled 2024-07-12: qty 1.2

## 2024-07-12 NOTE — Assessment & Plan Note (Addendum)
 He has intermittent elevated serum creatinine We will proceed with treatment today without delay We discussed importance of hydration

## 2024-07-12 NOTE — Progress Notes (Signed)
 [facility] OFFICE PROGRESS NOTE  Patient Care Team: Jarold Medici, MD as PCP - General (Internal Medicine) [name], RN as Oncology Nurse Navigator  Assessment & Plan Multiple myeloma not having achieved remission [facility]) The patient have prior diagnosis of smoldering myeloma.  He was referred to Dr. 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 PET/CT imaging was reviewed with the patient and his wife which show no evidence of significant bone involvement/fractures  So far, he tolerated treatment well except for intermittent reflux and high blood sugar on the days that he takes treatment We discussed the role of bone marrow transplant and for now, he is not keen for transplant evaluation Repeat myeloma panel showed positive response to therapy We discussed adding lenalidomide  for deeper response, started in September  Recent myeloma panel from December showed stable/positive control of his disease I recommend complete dexamethasone  taper by end of 2025 For now, he will continue lenalidomide  21 days on, 7 days off He will get daratumumab  every 28 days He will get Velcade  every other week He will continue taking Eliquis  for DVT prophylaxis He will continue taking calcium , vitamin D  supplement and Zometa  every 3 months, next due in Feb Repeat myeloma panel today is pending; if his myeloma panel continues to show no improvement beyond what is achieved in the last 2 months, I plan to increase the dose of lenalidomide  CKD stage 3a, GFR 45-59 ml/min (HCC) He has intermittent elevated serum creatinine We will proceed with treatment today without delay We discussed importance of  hydration Deficiency anemia Overall his anemia is improving He is aware that lenalidomide  can cause anemia Will monitor his blood counts carefully  No orders of the defined types were placed in this encounter.    Almarie Bedford, MD  INTERVAL HISTORY: he returns for treatment follow-up for multiple myeloma Complications related to previous cycle of chemotherapy included anemia, and elevated serum creatinine He is doing well Blood sugar is stable, in general, his blood sugar reading is less than 200 No recent infection  PHYSICAL EXAMINATION: ECOG PERFORMANCE STATUS: 0 - Asymptomatic  Lab Results  Component Value Date   IGGSERUM 1,145 05/31/2024   TOTALPROTELP 6.8 05/31/2024   MPROTEIN 0.8 (H) 05/31/2024   IFE Comment (A) 05/31/2024   KPAFRELGTCHN 48.2 (H) 05/31/2024   LAMBDASER 7.1 05/31/2024   KAPLAMBRATIO 6.79 (H) 05/31/2024   KAPLAMBRATIO 5.40 (H) 05/03/2024   KAPLAMBRATIO 11.07 (H) 04/05/2024      Latest Ref Rng & Units 07/12/2024    8:51 AM 06/28/2024    8:49 AM 06/14/2024    8:58 AM  CBC  WBC 4.0 - 10.5 K/uL 5.4  7.1  6.5   Hemoglobin 13.0 - 17.0 g/dL 9.9  9.4  9.4   Hematocrit 39.0 - 52.0 % 30.0  28.1  28.7   Platelets 150 - 400 K/uL 152  303  171       Chemistry      Component Value Date/Time   NA 138 07/12/2024 0851   NA 137 11/05/2022 1152   K 4.1 07/12/2024 0851   CL 102 07/12/2024 0851   CO2 25 07/12/2024 0851   BUN 19 07/12/2024 0851   BUN 11 11/05/2022 1152  CREATININE 1.30 (H) 07/12/2024 0851      Component Value Date/Time   CALCIUM  9.6 07/12/2024 0851   ALKPHOS 92 07/12/2024 0851   AST 19 07/12/2024 0851   ALT 13 07/12/2024 0851   BILITOT 0.4 07/12/2024 0851       Vitals:   07/12/24 0911  BP: (!) 128/55  Pulse: 77  Resp: 18  Temp: 98.1 F (36.7 C)  SpO2: 100%   Filed Weights   07/12/24 0911  Weight: 221 lb (100.2 kg)   Other relevant data reviewed during this visit included CBC and CMP

## 2024-07-12 NOTE — Telephone Encounter (Signed)
 Followed up on APP status for Trulicity  [employer]) and status is over income,  called patient to verify and he said it is less than what was put on app. He will send POI document to forward to [facility] for re-evaluation.

## 2024-07-12 NOTE — Assessment & Plan Note (Addendum)
 The patient have prior diagnosis of smoldering myeloma.  He was referred to Dr. 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 PET/CT imaging was reviewed with the patient and his wife which show no evidence of significant bone involvement/fractures  So far, he tolerated treatment well except for intermittent reflux and high blood sugar on the days that he takes treatment We discussed the role of bone marrow transplant and for now, he is not keen for transplant evaluation Repeat myeloma panel showed positive response to therapy We discussed adding lenalidomide  for deeper response, started in September  Recent myeloma panel from December showed stable/positive control of his disease I recommend complete dexamethasone  taper by end of 2025 For now, he will continue lenalidomide  21 days on, 7 days off He will get daratumumab  every 28 days He will get Velcade  every other week He will continue taking Eliquis  for DVT prophylaxis He will continue taking calcium , vitamin D  supplement and Zometa  every 3 months, next due in Feb Repeat myeloma panel today is pending; if his myeloma panel continues to show no improvement beyond what is achieved in the last 2 months, I plan to increase the dose of lenalidomide 

## 2024-07-12 NOTE — Patient Instructions (Signed)
 [facility] - A DEPT OF [facility]  Discharge Instructions: Thank you for choosing [facility] to provide your oncology and hematology care.   If you have a lab appointment with the Cancer Center, please go directly to the Cancer Center and check in at the registration area.   Wear comfortable clothing and clothing appropriate for easy access to any Portacath or PICC line.   We strive to give you quality time with your provider. You may need to reschedule your appointment if you arrive late (15 or more minutes).  Arriving late affects you and other patients whose appointments are after yours.  Also, if you miss three or more appointments without notifying the office, you may be dismissed from the clinic at the provider's discretion.      For prescription refill requests, have your pharmacy contact our office and allow 72 hours for refills to be completed.    Today you received the following chemotherapy and/or immunotherapy agents: Velcade     To help prevent nausea and vomiting after your treatment, we encourage you to take your nausea medication as directed.  BELOW ARE SYMPTOMS THAT SHOULD BE REPORTED IMMEDIATELY: *FEVER GREATER THAN 100.4 F (38 C) OR HIGHER *CHILLS OR SWEATING *NAUSEA AND VOMITING THAT IS NOT CONTROLLED WITH YOUR NAUSEA MEDICATION *UNUSUAL SHORTNESS OF BREATH *UNUSUAL BRUISING OR BLEEDING *URINARY PROBLEMS (pain or burning when urinating, or frequent urination) *BOWEL PROBLEMS (unusual diarrhea, constipation, pain near the anus) TENDERNESS IN MOUTH AND THROAT WITH OR WITHOUT PRESENCE OF ULCERS (sore throat, sores in mouth, or a toothache) UNUSUAL RASH, SWELLING OR PAIN  UNUSUAL VAGINAL DISCHARGE OR ITCHING   Items with * indicate a potential emergency and should be followed up as soon as possible or go to the Emergency Department if any problems should occur.  Please show the CHEMOTHERAPY ALERT CARD or IMMUNOTHERAPY  ALERT CARD at check-in to the Emergency Department and triage nurse.  Should you have questions after your visit or need to cancel or reschedule your appointment, please contact [facility] - A DEPT OF [facility]  Dept: [phone]  and follow the prompts.  Office hours are 8:00 a.m. to 4:30 p.m. Monday - Friday. Please note that voicemails left after 4:00 p.m. may not be returned until the following business day.  We are closed weekends and major holidays. You have access to a nurse at all times for urgent questions. Please call the main number to the clinic Dept: [phone] and follow the prompts.   For any non-urgent questions, you may also contact your provider using MyChart. We now offer e-Visits for anyone 88 and older to request care online for non-urgent symptoms. For details visit [email or web address].   Also download the MyChart app! Go to the app store, search MyChart, open the app, select [place], and log in with your MyChart username and password.

## 2024-07-12 NOTE — Telephone Encounter (Signed)
 Called to inform the patient and his spouse that appointments were added for Tuesday 2/3 at 0915. Talked with the patient and he is aware of the made appointments.

## 2024-07-12 NOTE — Assessment & Plan Note (Addendum)
 Overall his anemia is improving He is aware that lenalidomide  can cause anemia Will monitor his blood counts carefully

## 2024-07-13 LAB — KAPPA/LAMBDA LIGHT CHAINS
Kappa free light chain: 44.4 mg/L — ABNORMAL HIGH (ref 3.3–19.4)
Kappa, lambda light chain ratio: 5.48 — ABNORMAL HIGH (ref 0.26–1.65)
Lambda free light chains: 8.1 mg/L (ref 5.7–26.3)

## 2024-07-14 ENCOUNTER — Telehealth: Payer: Self-pay

## 2024-07-14 LAB — MULTIPLE MYELOMA PANEL, SERUM
Albumin SerPl Elph-Mcnc: 3.7 g/dL (ref 2.9–4.4)
Albumin/Glob SerPl: 1.4 (ref 0.7–1.7)
Alpha 1: 0.2 g/dL (ref 0.0–0.4)
Alpha2 Glob SerPl Elph-Mcnc: 0.9 g/dL (ref 0.4–1.0)
B-Globulin SerPl Elph-Mcnc: 0.8 g/dL (ref 0.7–1.3)
Gamma Glob SerPl Elph-Mcnc: 0.9 g/dL (ref 0.4–1.8)
Globulin, Total: 2.8 g/dL (ref 2.2–3.9)
IgA: 38 mg/dL — ABNORMAL LOW (ref 61–437)
IgG (Immunoglobin G), Serum: 999 mg/dL (ref 603–1613)
IgM (Immunoglobulin M), Srm: 29 mg/dL (ref 20–172)
M Protein SerPl Elph-Mcnc: 0.6 g/dL — ABNORMAL HIGH
Total Protein ELP: 6.5 g/dL (ref 6.0–8.5)

## 2024-07-14 NOTE — Telephone Encounter (Signed)
-----   Message from Almarie Bedford, MD sent at 07/14/2024 10:06 AM EST ----- MM panel is better let him know

## 2024-07-14 NOTE — Telephone Encounter (Signed)
Called and left below message. Ask him to call the office for questions. 

## 2024-07-25 NOTE — Telephone Encounter (Signed)
 PAP: Patient has been denied for patient assistance by [employer] due to patient above income threshold for program letter in media.

## 2024-07-26 ENCOUNTER — Inpatient Hospital Stay

## 2024-07-27 ENCOUNTER — Inpatient Hospital Stay

## 2024-07-27 ENCOUNTER — Inpatient Hospital Stay: Attending: Hematology and Oncology

## 2024-07-27 VITALS — BP 123/66 | HR 91 | Temp 98.2°F | Resp 16 | Wt 219.8 lb

## 2024-07-27 DIAGNOSIS — C9000 Multiple myeloma not having achieved remission: Secondary | ICD-10-CM

## 2024-07-27 LAB — CMP (CANCER CENTER ONLY)
ALT: 13 U/L (ref 0–44)
AST: 21 U/L (ref 15–41)
Albumin: 4.5 g/dL (ref 3.5–5.0)
Alkaline Phosphatase: 109 U/L (ref 38–126)
Anion gap: 10 (ref 5–15)
BUN: 18 mg/dL (ref 8–23)
CO2: 25 mmol/L (ref 22–32)
Calcium: 10.1 mg/dL (ref 8.9–10.3)
Chloride: 103 mmol/L (ref 98–111)
Creatinine: 1.29 mg/dL — ABNORMAL HIGH (ref 0.61–1.24)
GFR, Estimated: 60 mL/min — ABNORMAL LOW
Glucose, Bld: 148 mg/dL — ABNORMAL HIGH (ref 70–99)
Potassium: 4.8 mmol/L (ref 3.5–5.1)
Sodium: 137 mmol/L (ref 135–145)
Total Bilirubin: 0.4 mg/dL (ref 0.0–1.2)
Total Protein: 7.4 g/dL (ref 6.5–8.1)

## 2024-07-27 LAB — CBC WITH DIFFERENTIAL (CANCER CENTER ONLY)
Abs Immature Granulocytes: 0.03 10*3/uL (ref 0.00–0.07)
Basophils Absolute: 0 10*3/uL (ref 0.0–0.1)
Basophils Relative: 1 %
Eosinophils Absolute: 0 10*3/uL (ref 0.0–0.5)
Eosinophils Relative: 0 %
HCT: 31.9 % — ABNORMAL LOW (ref 39.0–52.0)
Hemoglobin: 10.4 g/dL — ABNORMAL LOW (ref 13.0–17.0)
Immature Granulocytes: 1 %
Lymphocytes Relative: 9 %
Lymphs Abs: 0.5 10*3/uL — ABNORMAL LOW (ref 0.7–4.0)
MCH: 28.8 pg (ref 26.0–34.0)
MCHC: 32.6 g/dL (ref 30.0–36.0)
MCV: 88.4 fL (ref 80.0–100.0)
Monocytes Absolute: 0.2 10*3/uL (ref 0.1–1.0)
Monocytes Relative: 3 %
Neutro Abs: 5.2 10*3/uL (ref 1.7–7.7)
Neutrophils Relative %: 86 %
Platelet Count: 277 10*3/uL (ref 150–400)
RBC: 3.61 MIL/uL — ABNORMAL LOW (ref 4.22–5.81)
RDW: 14.6 % (ref 11.5–15.5)
WBC Count: 5.9 10*3/uL (ref 4.0–10.5)
nRBC: 0 % (ref 0.0–0.2)

## 2024-07-27 MED ORDER — BORTEZOMIB CHEMO SQ INJECTION 3.5 MG (2.5MG/ML)
1.3000 mg/m2 | Freq: Once | INTRAMUSCULAR | Status: AC
  Administered 2024-07-27: 3 mg via SUBCUTANEOUS
  Filled 2024-07-27: qty 1.2

## 2024-07-27 MED ORDER — DARATUMUMAB-HYALURONIDASE-FIHJ 1800-30000 MG-UT/15ML ~~LOC~~ SOLN
1800.0000 mg | Freq: Once | SUBCUTANEOUS | Status: AC
  Administered 2024-07-27: 1800 mg via SUBCUTANEOUS
  Filled 2024-07-27: qty 15

## 2024-07-27 MED ORDER — DIPHENHYDRAMINE HCL 25 MG PO CAPS
25.0000 mg | ORAL_CAPSULE | Freq: Once | ORAL | Status: AC
  Administered 2024-07-27: 25 mg via ORAL
  Filled 2024-07-27: qty 1

## 2024-07-27 MED ORDER — ACETAMINOPHEN 325 MG PO TABS
650.0000 mg | ORAL_TABLET | Freq: Once | ORAL | Status: AC
  Administered 2024-07-27: 650 mg via ORAL
  Filled 2024-07-27: qty 2

## 2024-07-27 NOTE — Progress Notes (Signed)
 Pt reports taking steroid at home this am but not seen on med list. Message to MD to verifiy steroid.

## 2024-07-27 NOTE — Patient Instructions (Signed)
 [facility] - A DEPT OF [facility]  Discharge Instructions: Thank you for choosing [facility] to provide your oncology and hematology care.   If you have a lab appointment with the Cancer Center, please go directly to the Cancer Center and check in at the registration area.   Wear comfortable clothing and clothing appropriate for easy access to any Portacath or PICC line.   We strive to give you quality time with your provider. You may need to reschedule your appointment if you arrive late (15 or more minutes).  Arriving late affects you and other patients whose appointments are after yours.  Also, if you miss three or more appointments without notifying the office, you may be dismissed from the clinic at the providers discretion.      For prescription refill requests, have your pharmacy contact our office and allow 72 hours for refills to be completed.    Today you received the following chemotherapy and/or immunotherapy agents :  Darzelex Fastpro & Bortezomib       To help prevent nausea and vomiting after your treatment, we encourage you to take your nausea medication as directed.  BELOW ARE SYMPTOMS THAT SHOULD BE REPORTED IMMEDIATELY: *FEVER GREATER THAN 100.4 F (38 C) OR HIGHER *CHILLS OR SWEATING *NAUSEA AND VOMITING THAT IS NOT CONTROLLED WITH YOUR NAUSEA MEDICATION *UNUSUAL SHORTNESS OF BREATH *UNUSUAL BRUISING OR BLEEDING *URINARY PROBLEMS (pain or burning when urinating, or frequent urination) *BOWEL PROBLEMS (unusual diarrhea, constipation, pain near the anus) TENDERNESS IN MOUTH AND THROAT WITH OR WITHOUT PRESENCE OF ULCERS (sore throat, sores in mouth, or a toothache) UNUSUAL RASH, SWELLING OR PAIN  UNUSUAL VAGINAL DISCHARGE OR ITCHING   Items with * indicate a potential emergency and should be followed up as soon as possible or go to the Emergency Department if any problems should occur.  Please show the CHEMOTHERAPY ALERT  CARD or IMMUNOTHERAPY ALERT CARD at check-in to the Emergency Department and triage nurse.  Should you have questions after your visit or need to cancel or reschedule your appointment, please contact [facility] - A DEPT OF [facility]  Dept: [phone]  and follow the prompts.  Office hours are 8:00 a.m. to 4:30 p.m. Monday - Friday. Please note that voicemails left after 4:00 p.m. may not be returned until the following business day.  We are closed weekends and major holidays. You have access to a nurse at all times for urgent questions. Please call the main number to the clinic Dept: [phone] and follow the prompts.   For any non-urgent questions, you may also contact your provider using MyChart. We now offer e-Visits for anyone 17 and older to request care online for non-urgent symptoms. For details visit [email or web address].   Also download the MyChart app! Go to the app store, search MyChart, open the app, select [place], and log in with your MyChart username and password.

## 2024-08-09 ENCOUNTER — Inpatient Hospital Stay

## 2024-08-09 ENCOUNTER — Inpatient Hospital Stay: Admitting: Hematology and Oncology

## 2024-08-23 ENCOUNTER — Inpatient Hospital Stay

## 2024-08-23 ENCOUNTER — Inpatient Hospital Stay: Attending: Hematology and Oncology

## 2024-08-23 ENCOUNTER — Inpatient Hospital Stay: Admitting: Hematology and Oncology

## 2024-09-06 ENCOUNTER — Inpatient Hospital Stay: Admitting: Hematology and Oncology

## 2024-09-06 ENCOUNTER — Inpatient Hospital Stay

## 2024-11-02 ENCOUNTER — Encounter: Payer: Self-pay | Admitting: Internal Medicine

## 2024-12-28 ENCOUNTER — Ambulatory Visit: Payer: Self-pay
